# Patient Record
Sex: Male | Born: 1945
Health system: Southern US, Community
[De-identification: ages and names within clinical notes are randomized; demographics above are authoritative.]

## PROBLEM LIST (undated history)

## (undated) DIAGNOSIS — I7121 Aneurysm of the ascending aorta, without rupture: Secondary | ICD-10-CM

## (undated) DIAGNOSIS — J342 Deviated nasal septum: Secondary | ICD-10-CM

## (undated) DIAGNOSIS — M1990 Unspecified osteoarthritis, unspecified site: Secondary | ICD-10-CM

## (undated) DIAGNOSIS — R609 Edema, unspecified: Secondary | ICD-10-CM

## (undated) DIAGNOSIS — I2510 Atherosclerotic heart disease of native coronary artery without angina pectoris: Secondary | ICD-10-CM

## (undated) DIAGNOSIS — E118 Type 2 diabetes mellitus with unspecified complications: Secondary | ICD-10-CM

## (undated) DIAGNOSIS — Z9289 Personal history of other medical treatment: Secondary | ICD-10-CM

## (undated) DIAGNOSIS — N183 Chronic kidney disease, stage 3 (moderate): Secondary | ICD-10-CM

## (undated) DIAGNOSIS — E1165 Type 2 diabetes mellitus with hyperglycemia: Secondary | ICD-10-CM

## (undated) DIAGNOSIS — I712 Thoracic aortic aneurysm, without rupture: Secondary | ICD-10-CM

## (undated) DIAGNOSIS — I700 Atherosclerosis of aorta: Secondary | ICD-10-CM

## (undated) DIAGNOSIS — R9431 Abnormal electrocardiogram [ECG] [EKG]: Secondary | ICD-10-CM

## (undated) DIAGNOSIS — I10 Essential (primary) hypertension: Secondary | ICD-10-CM

## (undated) DIAGNOSIS — K5732 Diverticulitis of large intestine without perforation or abscess without bleeding: Secondary | ICD-10-CM

## (undated) DIAGNOSIS — J929 Pleural plaque without asbestos: Secondary | ICD-10-CM

## (undated) DIAGNOSIS — E785 Hyperlipidemia, unspecified: Secondary | ICD-10-CM

## (undated) DIAGNOSIS — R931 Abnormal findings on diagnostic imaging of heart and coronary circulation: Secondary | ICD-10-CM

## (undated) DIAGNOSIS — I5022 Chronic systolic (congestive) heart failure: Secondary | ICD-10-CM

## (undated) DIAGNOSIS — K219 Gastro-esophageal reflux disease without esophagitis: Secondary | ICD-10-CM

## (undated) DIAGNOSIS — I77810 Thoracic aortic ectasia: Secondary | ICD-10-CM

## (undated) DIAGNOSIS — I4892 Unspecified atrial flutter: Secondary | ICD-10-CM

## (undated) DIAGNOSIS — I5032 Chronic diastolic (congestive) heart failure: Secondary | ICD-10-CM

## (undated) HISTORY — DX: Pleural plaque without asbestos: J92.9

## (undated) HISTORY — DX: Chronic kidney disease, stage 3 (moderate): N18.3

## (undated) HISTORY — DX: Essential (primary) hypertension: I10

## (undated) HISTORY — DX: Edema, unspecified: R60.9

## (undated) HISTORY — DX: Abnormal electrocardiogram (ECG) (EKG): R94.31

## (undated) HISTORY — DX: Atherosclerosis of aorta: I70.0

## (undated) HISTORY — DX: Atherosclerotic heart disease of native coronary artery without angina pectoris: I25.10

## (undated) HISTORY — DX: Gastro-esophageal reflux disease without esophagitis: K21.9

## (undated) HISTORY — DX: Hyperlipidemia, unspecified: E78.5

## (undated) HISTORY — DX: Unspecified atrial flutter: I48.92

## (undated) HISTORY — DX: Chronic diastolic (congestive) heart failure: I50.32

## (undated) HISTORY — DX: Diverticulitis of large intestine without perforation or abscess without bleeding: K57.32

## (undated) HISTORY — DX: Personal history of other medical treatment: Z92.89

## (undated) HISTORY — DX: Thoracic aortic aneurysm, without rupture: I71.2

## (undated) HISTORY — DX: Abnormal findings on diagnostic imaging of heart and coronary circulation: R93.1

## (undated) HISTORY — DX: Deviated nasal septum: J34.2

## (undated) HISTORY — DX: Type 2 diabetes mellitus with hyperglycemia: E11.65

## (undated) HISTORY — DX: Aneurysm of the ascending aorta, without rupture: I71.21

## (undated) HISTORY — DX: Type 2 diabetes mellitus with unspecified complications: E11.8

## (undated) HISTORY — DX: Thoracic aortic ectasia: I77.810

---

## 2001-02-17 HISTORY — PX: KNEE ARTHROSCOPY: SHX127

## 2001-03-25 ENCOUNTER — Ambulatory Visit (HOSPITAL_COMMUNITY): Admission: RE | Admit: 2001-03-25 | Discharge: 2001-03-25 | Payer: Self-pay | Admitting: Gastroenterology

## 2002-01-28 ENCOUNTER — Ambulatory Visit (HOSPITAL_BASED_OUTPATIENT_CLINIC_OR_DEPARTMENT_OTHER): Admission: RE | Admit: 2002-01-28 | Discharge: 2002-01-28 | Payer: Self-pay | Admitting: Orthopedic Surgery

## 2004-02-18 HISTORY — PX: KNEE ARTHROSCOPY: SHX127

## 2011-10-19 DIAGNOSIS — I2510 Atherosclerotic heart disease of native coronary artery without angina pectoris: Secondary | ICD-10-CM

## 2011-10-19 HISTORY — DX: Atherosclerotic heart disease of native coronary artery without angina pectoris: I25.10

## 2011-10-22 ENCOUNTER — Other Ambulatory Visit: Payer: Self-pay | Admitting: Cardiology

## 2011-10-22 ENCOUNTER — Encounter: Payer: Self-pay | Admitting: Cardiology

## 2011-10-22 NOTE — H&P (Signed)
Progress Notes     Patient: James [name] Provider: Traci Turner, MD  DOB: 12/24/1945 Age: 66 Y Sex: Male Date: 10/22/2011  Phone: [phone]   Address: [address], [place], [place]  Pcp: James [name]       Subjective:     CC:    1. REFERRED BY DR [name] FOR CP AND SOB ON EXERTION. 2. No med list. verified verbally. .        HPI:  General:  The patient presents today for evaluation of chest pain and SOB. He says that he works out about 3 days weekly which he has been doing for a while. Recently he has noticed that about 1.5 minutes into the walk on the treadmill he has noticed chest pain midsternal with no radiation but is associated with SOB. He denies any diaphoresis or nausea. This has been going on for about 6 months. He is building a new house and the driveway is steep and if he tries to walk up it he will get chest pain. He initially thought it was asthma and was prescribed an inhaler to take before exercise which has not helped. He was recently started on a beta blocker which had seemed to help with him walking up the hill. He has only been on the beta blocker for 2 days. He occasionally has some LE edema. He denies any palpitations, dizziness or syncope..        ROS:  See HPI, A twelve system review was perfomed at today's visit. For pertinent positives and negatives see HPI.       Medical History: Severe nasal septal deviation, for planned surgery, Htn, Hypercholesterolemia.        Gyn History:        OB History:        Surgical History: Left knee arthroscopy 01/2002, Right knee arthroscopy 04/2004, colonoscopy 2008.        Hospitalization/Major Diagnostic Procedure: not in the past year 5/13.        Family History: Father: alive Mother: alive Paternal Grand Father: deceased obesity, high blood pressure Paternal Grand Mother: deceased Maternal Grand Father: deceased Maternal Grand Mother: deceased  neg GI family hx of colon cancer polyps or liver  disease.       Social History:  General:  History of smoking cigarettes: Never smoked.  no Smoking, never.  Alcohol: yes, Rare.  Caffeine: yes, 1 serving daily, coffee.  no Recreational drug use, none.  Occupation: unemployed, retired.  Education: yes, BS.  Marital Status: Divorced.  Children: 1, girls.        Medications: Aspirin 325 MG Tablet 1 tablet once a day, Multivitamins Tablet 1 tablet once a day, Omega-3 Fish Oil Capsule 2 capsules twice a day, Prilosec OTC 20 MG Tablet Delayed Release 1 tablet PRN, Crestor 40 MG Tablet 1 tablet once a day, Benicar HCT 40-25 MG Tablet 1 tablet Once a day, Pramipexole Dihydrochloride 0.75 MG Tablet 1 tablet twice a day, Metoprolol Succinate 50 MG Tablet Extended Release 24 Hour 1 tablet Once a day, Medication List reviewed and reconciled with the patient       Allergies: Lisinopril.       Objective:     Vitals: Wt 267.2, Wt change -1.3 lb, Ht 69, BMI 39.45, Pulse sitting 64, BP sitting 132/90.       Examination:  Cardiology, General:  GENERAL APPEARANCE: pleasant, NAD.  HEENT: unremarkable.  CAROTID UPSTROKE: normal, no bruit.  JVD: flat.  HEART SOUNDS: regular,   normal S1, S2, no S3 or S4.  MURMUR: absent.  LUNGS: no rales or wheezes.  ABDOMEN: soft, non tender, positive bowel sounds, no masses felt.  EXTREMITIES: bilateral trace pitting edema.  PERIPHERAL PULSES: 2 plus bilateral.        Assessment:     Assessment:  1. Chest pain - 786.50 (Primary)  2. Essential hypertension, benign - 401.1  3. SOBOE (shortness of breath on exertion) - 786.05    Plan:     1. Chest pain - He underwent nuclear stress test which showed anterior and inferoapical perfusion defects consistent with ischemia and transient ischemic dilatation worrisome for multi-vessel ASCAD.        2. Essential hypertension, benign Continue Benicar HCT Tablet, 40-25 MG, 1 tablet, Orally, Once a day ; Continue Metoprolol Succinate Tablet Extended Release 24  Hour, 50 MG, 1 tablet, Orally, Once a day .       Provider: Traci Turner, MD  Patient: James [name] DOB: 03/02/1945 Date: 10/22/2011    

## 2011-10-23 ENCOUNTER — Inpatient Hospital Stay (HOSPITAL_COMMUNITY)
Admission: RE | Admit: 2011-10-23 | Discharge: 2011-11-03 | DRG: 234 | Disposition: A | Discharge status: Home / Self Care | Payer: Medicare Other | Source: Ambulatory Visit | Attending: Cardiothoracic Surgery | Admitting: Cardiothoracic Surgery

## 2011-10-23 ENCOUNTER — Encounter (HOSPITAL_COMMUNITY)
Admission: RE | Disposition: A | Discharge status: Home / Self Care | Payer: Self-pay | Source: Ambulatory Visit | Attending: Cardiothoracic Surgery

## 2011-10-23 DIAGNOSIS — I200 Unstable angina: Secondary | ICD-10-CM | POA: Diagnosis present

## 2011-10-23 DIAGNOSIS — E1165 Type 2 diabetes mellitus with hyperglycemia: Secondary | ICD-10-CM

## 2011-10-23 DIAGNOSIS — K560 Paralytic ileus: Secondary | ICD-10-CM | POA: Diagnosis not present

## 2011-10-23 DIAGNOSIS — E669 Obesity, unspecified: Secondary | ICD-10-CM | POA: Diagnosis present

## 2011-10-23 DIAGNOSIS — I2510 Atherosclerotic heart disease of native coronary artery without angina pectoris: Principal | ICD-10-CM | POA: Diagnosis present

## 2011-10-23 DIAGNOSIS — I10 Essential (primary) hypertension: Secondary | ICD-10-CM | POA: Diagnosis present

## 2011-10-23 DIAGNOSIS — I4891 Unspecified atrial fibrillation: Secondary | ICD-10-CM | POA: Diagnosis not present

## 2011-10-23 DIAGNOSIS — J9819 Other pulmonary collapse: Secondary | ICD-10-CM | POA: Diagnosis not present

## 2011-10-23 DIAGNOSIS — E785 Hyperlipidemia, unspecified: Secondary | ICD-10-CM | POA: Diagnosis present

## 2011-10-23 DIAGNOSIS — N289 Disorder of kidney and ureter, unspecified: Secondary | ICD-10-CM | POA: Diagnosis not present

## 2011-10-23 DIAGNOSIS — K219 Gastro-esophageal reflux disease without esophagitis: Secondary | ICD-10-CM | POA: Diagnosis present

## 2011-10-23 DIAGNOSIS — Z951 Presence of aortocoronary bypass graft: Secondary | ICD-10-CM

## 2011-10-23 DIAGNOSIS — D62 Acute posthemorrhagic anemia: Secondary | ICD-10-CM | POA: Diagnosis not present

## 2011-10-23 DIAGNOSIS — IMO0001 Reserved for inherently not codable concepts without codable children: Secondary | ICD-10-CM | POA: Diagnosis present

## 2011-10-23 DIAGNOSIS — Z7982 Long term (current) use of aspirin: Secondary | ICD-10-CM

## 2011-10-23 DIAGNOSIS — E118 Type 2 diabetes mellitus with unspecified complications: Secondary | ICD-10-CM | POA: Diagnosis present

## 2011-10-23 DIAGNOSIS — I4892 Unspecified atrial flutter: Secondary | ICD-10-CM | POA: Diagnosis not present

## 2011-10-23 DIAGNOSIS — J90 Pleural effusion, not elsewhere classified: Secondary | ICD-10-CM | POA: Diagnosis not present

## 2011-10-23 DIAGNOSIS — D696 Thrombocytopenia, unspecified: Secondary | ICD-10-CM | POA: Diagnosis not present

## 2011-10-23 DIAGNOSIS — E8779 Other fluid overload: Secondary | ICD-10-CM | POA: Diagnosis not present

## 2011-10-23 DIAGNOSIS — Z79899 Other long term (current) drug therapy: Secondary | ICD-10-CM

## 2011-10-23 HISTORY — PX: LEFT HEART CATHETERIZATION WITH CORONARY ANGIOGRAM: SHX5451

## 2011-10-23 LAB — COMPREHENSIVE METABOLIC PANEL
ALT: 22 U/L (ref 0–53)
AST: 19 U/L (ref 0–37)
CO2: 27 mEq/L (ref 19–32)
Chloride: 96 mEq/L (ref 96–112)
GFR calc non Af Amer: 71 mL/min — ABNORMAL LOW (ref >90)
Sodium: 136 mEq/L (ref 135–145)
Total Bilirubin: 0.9 mg/dL (ref 0.3–1.2)

## 2011-10-23 SURGERY — LEFT HEART CATHETERIZATION WITH CORONARY ANGIOGRAM
Anesthesia: LOCAL

## 2011-10-23 MED ORDER — ASPIRIN EC 325 MG PO TBEC: 325.0000 mg | DELAYED_RELEASE_TABLET | Freq: Every day | ORAL | Status: DC

## 2011-10-23 MED ORDER — ONDANSETRON HCL 4 MG/2ML IJ SOLN: 4.0000 mg | Freq: Four times a day (QID) | INTRAMUSCULAR | Status: DC | PRN

## 2011-10-23 MED ORDER — ACETAMINOPHEN 325 MG PO TABS: 650.0000 mg | ORAL_TABLET | ORAL | Status: DC | PRN

## 2011-10-23 MED ORDER — HYDROCHLOROTHIAZIDE 25 MG PO TABS
25.0000 mg | ORAL_TABLET | Freq: Every day | ORAL | Status: DC
  Administered 2011-10-24 – 2011-10-26 (×3): 25 mg via ORAL
  Filled 2011-10-23 (×4): qty 1

## 2011-10-23 MED ORDER — SODIUM CHLORIDE 0.9 % IV SOLN
INTRAVENOUS | Status: DC
  Administered 2011-10-23 – 2011-10-24 (×2): via INTRAVENOUS

## 2011-10-23 MED ORDER — HEPARIN (PORCINE) IN NACL 100-0.45 UNIT/ML-% IJ SOLN
1800.0000 [IU]/h | INTRAMUSCULAR | Status: DC
  Administered 2011-10-23: 1200 [IU]/h via INTRAVENOUS
  Administered 2011-10-24: 1800 [IU]/h via INTRAVENOUS
  Filled 2011-10-23 (×3): qty 250

## 2011-10-23 MED ORDER — ASPIRIN 81 MG PO CHEW
324.0000 mg | CHEWABLE_TABLET | ORAL | Status: AC
  Administered 2011-10-23: 324 mg via ORAL

## 2011-10-23 MED ORDER — LIDOCAINE HCL (PF) 1 % IJ SOLN
INTRAMUSCULAR | Status: AC
  Filled 2011-10-23: qty 30

## 2011-10-23 MED ORDER — HEPARIN (PORCINE) IN NACL 2-0.9 UNIT/ML-% IJ SOLN
INTRAMUSCULAR | Status: AC
  Filled 2011-10-23: qty 1000

## 2011-10-23 MED ORDER — IRBESARTAN 300 MG PO TABS
300.0000 mg | ORAL_TABLET | Freq: Every day | ORAL | Status: DC
  Administered 2011-10-24 – 2011-10-26 (×3): 300 mg via ORAL
  Filled 2011-10-23 (×4): qty 1

## 2011-10-23 MED ORDER — FENTANYL CITRATE 0.05 MG/ML IJ SOLN
INTRAMUSCULAR | Status: AC
  Filled 2011-10-23: qty 2

## 2011-10-23 MED ORDER — MIDAZOLAM HCL 2 MG/2ML IJ SOLN
INTRAMUSCULAR | Status: AC
  Filled 2011-10-23: qty 2

## 2011-10-23 MED ORDER — MORPHINE SULFATE 2 MG/ML IJ SOLN: 1.0000 mg | INTRAMUSCULAR | Status: DC | PRN

## 2011-10-23 MED ORDER — ATORVASTATIN CALCIUM 80 MG PO TABS
80.0000 mg | ORAL_TABLET | Freq: Every day | ORAL | Status: DC
  Administered 2011-10-24 – 2011-11-03 (×10): 80 mg via ORAL
  Filled 2011-10-23 (×11): qty 1

## 2011-10-23 MED ORDER — ASPIRIN 81 MG PO CHEW: 81.0000 mg | CHEWABLE_TABLET | Freq: Every day | ORAL | Status: DC

## 2011-10-23 MED ORDER — SODIUM CHLORIDE 0.9 % IV SOLN: 1.0000 mL/kg/h | INTRAVENOUS | Status: AC

## 2011-10-23 MED ORDER — NITROGLYCERIN 0.2 MG/ML ON CALL CATH LAB
INTRAVENOUS | Status: AC
  Filled 2011-10-23: qty 1

## 2011-10-23 MED ORDER — OLMESARTAN MEDOXOMIL-HCTZ 40-25 MG PO TABS: 1.0000 | ORAL_TABLET | Freq: Every day | ORAL | Status: DC

## 2011-10-23 MED ORDER — NITROGLYCERIN IN D5W 200-5 MCG/ML-% IV SOLN
3.0000 ug/min | INTRAVENOUS | Status: DC
  Administered 2011-10-24: 3 ug/min via INTRAVENOUS
  Filled 2011-10-23: qty 250

## 2011-10-23 MED ORDER — PANTOPRAZOLE SODIUM 40 MG PO TBEC
40.0000 mg | DELAYED_RELEASE_TABLET | Freq: Every day | ORAL | Status: DC
  Administered 2011-10-24 – 2011-10-26 (×3): 40 mg via ORAL
  Filled 2011-10-23 (×3): qty 1

## 2011-10-23 MED ORDER — DIAZEPAM 5 MG PO TABS
5.0000 mg | ORAL_TABLET | ORAL | Status: AC
  Administered 2011-10-23: 5 mg via ORAL

## 2011-10-23 MED ORDER — SODIUM CHLORIDE 0.9 % IJ SOLN
3.0000 mL | Freq: Two times a day (BID) | INTRAMUSCULAR | Status: DC
  Administered 2011-10-23 – 2011-10-26 (×7): 3 mL via INTRAVENOUS

## 2011-10-23 MED ORDER — PRAMIPEXOLE DIHYDROCHLORIDE 0.25 MG PO TABS
0.7500 mg | ORAL_TABLET | ORAL | Status: AC
  Administered 2011-10-23: 0.75 mg via ORAL
  Filled 2011-10-23: qty 3

## 2011-10-23 MED ORDER — ASPIRIN 81 MG PO CHEW
CHEWABLE_TABLET | ORAL | Status: AC
  Administered 2011-10-23: 324 mg via ORAL
  Filled 2011-10-23: qty 4

## 2011-10-23 MED ORDER — SODIUM CHLORIDE 0.9 % IV SOLN: 250.0000 mL | INTRAVENOUS | Status: DC | PRN

## 2011-10-23 MED ORDER — METOPROLOL SUCCINATE ER 50 MG PO TB24
50.0000 mg | ORAL_TABLET | Freq: Every day | ORAL | Status: DC
  Administered 2011-10-23 – 2011-10-26 (×2): 50 mg via ORAL
  Filled 2011-10-23 (×5): qty 1

## 2011-10-23 MED ORDER — SODIUM CHLORIDE 0.9 % IJ SOLN: 3.0000 mL | INTRAMUSCULAR | Status: DC | PRN

## 2011-10-23 MED ORDER — NITROGLYCERIN 0.4 MG SL SUBL: 0.4000 mg | SUBLINGUAL_TABLET | SUBLINGUAL | Status: DC | PRN

## 2011-10-23 MED ORDER — ASPIRIN EC 325 MG PO TBEC
325.0000 mg | DELAYED_RELEASE_TABLET | Freq: Every day | ORAL | Status: DC
  Administered 2011-10-23 – 2011-10-26 (×4): 325 mg via ORAL
  Filled 2011-10-23 (×5): qty 1

## 2011-10-23 MED ORDER — DIAZEPAM 5 MG PO TABS
ORAL_TABLET | ORAL | Status: AC
  Administered 2011-10-23: 5 mg via ORAL
  Filled 2011-10-23: qty 1

## 2011-10-23 MED ORDER — PRAMIPEXOLE DIHYDROCHLORIDE 0.25 MG PO TABS
0.7500 mg | ORAL_TABLET | Freq: Two times a day (BID) | ORAL | Status: DC
  Administered 2011-10-23 – 2011-10-26 (×7): 0.75 mg via ORAL
  Filled 2011-10-23 (×9): qty 3

## 2011-10-23 NOTE — Interval H&P Note (Signed)
History and Physical Interval Note:  10/23/2011 3:38 PM  James [name]  has presented today for surgery, with the diagnosis of abnormal stress test  The various methods of treatment have been discussed with the patient and family. After consideration of risks, benefits and other options for treatment, the patient has consented to  Procedure(s) (LRB) with comments: LEFT HEART CATHETERIZATION WITH CORONARY ANGIOGRAM (N/A) as a surgical intervention .  The patient's history has been reviewed, patient examined, no change in status, stable for surgery.  I have reviewed the patient's chart and labs.  Questions were answered to the patient's satisfaction.     TURNER,TRACI R

## 2011-10-23 NOTE — Progress Notes (Signed)
ANTICOAGULATION CONSULT NOTE - Initial Consult  Pharmacy Consult for Heparin Indication: Anticoagulation while awaiting CVTS plans  Allergies  Allergen Reactions  . Lisinopril     Patient Measurements: Height: 5\' 9"  (175.3 cm) Weight: 258 lb 2.5 oz (117.1 kg) IBW/kg (Calculated) : 70.7  Heparin Dosing Weight: 97 kg  Vital Signs: Temp: 98.8 F (37.1 C) (09/05 1938) Temp src: Oral (09/05 1938) BP: 127/62 mmHg (09/05 2000) Pulse Rate: 98  (09/05 1557)  Labs: No results found for this basename: HGB:2,HCT:3,PLT:3,APTT:3,LABPROT:3,INR:3,HEPARINUNFRC:3,CREATININE:3,CKTOTAL:3,CKMB:3,TROPONINI:3 in the last 72 hours  CrCl is unknown because no creatinine reading has been taken.   Medical History: Past Medical History  Diagnosis Date  . Hypertension   . Dyslipidemia   . Deviated nasal septum     Assessment: 66 y.o. M admitted to [facility] [place] on 9/5 for planned cath procedure in which the patient was found to have severe CAD and is now awaiting a CVTS consult. Pharmacy was consulted to start heparin 4 hours after sheath removed with no bolus. Sheath was removed at ~1630 this afternoon. Heparin dosing wt~ 97 kg. No baseline BMET at this time.   Goal of Therapy:  Heparin level 0.3-0.7 units/ml Monitor platelets by anticoagulation protocol: Yes   Plan:  1. Initiate heparin drip at rate of 1200 units/hr (12 ml/hr) starting at 2030 2. Daily heparin levels, CBC 3. Will continue to monitor for any signs/symptoms of bleeding and will follow up with heparin level in 8 hours with a.m. labs   Georgina Pillion, PharmD, BCPS Clinical Pharmacist Pager: [phone] 10/23/2011 8:10 PM

## 2011-10-23 NOTE — H&P (View-Only) (Signed)
Progress Notes     Patient: James [name], James [name] Provider: Armanda Magic, MD  DOB: 06-Jun-1945 Age: 66 Y Sex: Male Date: 10/22/2011  Phone: [phone]   Address: [address], [place], [place]  Pcp: ROBERT FRIED       Subjective:     CC:    1. REFERRED BY DR FRIED FOR CP AND SOB ON EXERTION. 2. No med list. verified verbally. Marland Kitchen        HPI:  General:  The patient presents today for evaluation of chest pain and SOB. He says that he works out about 3 days weekly which he has been doing for a while. Recently he has noticed that about 1.5 minutes into the walk on the treadmill he has noticed chest pain midsternal with no radiation but is associated with SOB. He denies any diaphoresis or nausea. This has been going on for about 6 months. He is building a new house and the driveway is steep and if he tries to walk up it he will get chest pain. He initially thought it was asthma and was prescribed an inhaler to take before exercise which has not helped. He was recently started on a beta blocker which had seemed to help with him walking up the hill. He has only been on the beta blocker for 2 days. He occasionally has some LE edema. He denies any palpitations, dizziness or syncope..        ROS:  See HPI, A twelve system review was perfomed at today's visit. For pertinent positives and negatives see HPI.       Medical History: Severe nasal septal deviation, for planned surgery, Htn, Hypercholesterolemia.        Gyn History:        OB History:        Surgical History: Left knee arthroscopy 01/2002, Right knee arthroscopy 04/2004, colonoscopy 2008.        Hospitalization/Major Diagnostic Procedure: not in the past year 5/13.        Family History: Father: alive Mother: alive Paternal Grand Father: deceased obesity, high blood pressure Paternal Grand Mother: deceased Maternal Grand Father: deceased Maternal Grand Mother: deceased  neg GI family hx of colon cancer polyps or liver  disease.       Social History:  General:  History of smoking cigarettes: Never smoked.  no Smoking, never.  Alcohol: yes, Rare.  Caffeine: yes, 1 serving daily, coffee.  no Recreational drug use, none.  Occupation: unemployed, retired.  Education: yes, BS.  Marital Status: Divorced.  Children: 1, girls.        Medications: Aspirin 325 MG Tablet 1 tablet once a day, Multivitamins Tablet 1 tablet once a day, Omega-3 Fish Oil Capsule 2 capsules twice a day, Prilosec OTC 20 MG Tablet Delayed Release 1 tablet PRN, Crestor 40 MG Tablet 1 tablet once a day, Benicar HCT 40-25 MG Tablet 1 tablet Once a day, Pramipexole Dihydrochloride 0.75 MG Tablet 1 tablet twice a day, Metoprolol Succinate 50 MG Tablet Extended Release 24 Hour 1 tablet Once a day, Medication List reviewed and reconciled with the patient       Allergies: Lisinopril.       Objective:     Vitals: Wt 267.2, Wt change -1.3 lb, Ht 69, BMI 39.45, Pulse sitting 64, BP sitting 132/90.       Examination:  Cardiology, General:  GENERAL APPEARANCE: pleasant, NAD.  HEENT: unremarkable.  CAROTID UPSTROKE: normal, no bruit.  JVD: flat.  HEART SOUNDS: regular,  normal S1, S2, no S3 or S4.  MURMUR: absent.  LUNGS: no rales or wheezes.  ABDOMEN: soft, non tender, positive bowel sounds, no masses felt.  EXTREMITIES: bilateral trace pitting edema.  PERIPHERAL PULSES: 2 plus bilateral.        Assessment:     Assessment:  1. Chest pain - 786.50 (Primary)  2. Essential hypertension, benign - 401.1  3. SOBOE (shortness of breath on exertion) - 786.05    Plan:     1. Chest pain - He underwent nuclear stress test which showed anterior and inferoapical perfusion defects consistent with ischemia and transient ischemic dilatation worrisome for multi-vessel ASCAD.        2. Essential hypertension, benign Continue Benicar HCT Tablet, 40-25 MG, 1 tablet, Orally, Once a day ; Continue Metoprolol Succinate Tablet Extended Release 24  Hour, 50 MG, 1 tablet, Orally, Once a day .       Provider: Armanda Magic, MD  Patient: James [name], James [name] DOB: Dec 16, 1945 Date: 10/22/2011

## 2011-10-23 NOTE — CV Procedure (Signed)
PROCEDURE:  Left heart catheterization with selective coronary angiography, left ventriculogram.  INDICATIONS:    The risks, benefits, and details of the procedure were explained to the patient.  The patient verbalized understanding and wanted to proceed.  Informed written consent was obtained.  PROCEDURE TECHNIQUE:  After Xylocaine anesthesia a 26F sheath was placed in the right femoral artery with a single anterior needle wall stick.   Left coronary angiography was done using a Judkins L4 guide catheter.  Right coronary angiography was done using a Judkins R4 guide catheter.  Left ventriculography was done using a pigtail catheter.    CONTRAST:  Total of 130 cc.  COMPLICATIONS:  None.    HEMODYNAMICS:  Aortic pressure was 136/29mmHg; LV pressure was 139/58mmHg; LVEDP .  There was no gradient between the left ventricle and aorta.    ANGIOGRAPHIC DATA:   The left main coronary artery is short and appears patent until the distal portion where there appears to be disease with pressure damping upon catheter engaging the vessel.  The left anterior descending artery has a 99% stenosis at the takeoff of the first diagonal.  The ongoing LAD is patent.  The first diagonal is patent.  The LAD give rise to a second diagonal which is large with an 80% ostial stenosis.  The diagonal then trifurcates into 3 daughter vessels.    The left circumflex artery is widely patent and gives rise to a small first OM which is patent.  The left circumflex then gives rise to a large OM2 which is widely patent and trifurcates into 3 daughter vessels all of which are patent.    The right coronary artery has an ostial 90% stenosis and then gives rise to 2 moderate sized acute RV marginal branches which are patent.  The ongoing RCA has a long 80% stenosis in the distal vessel and then bifurcates into a PDA and PL branches which are patent.  LEFT VENTRICULOGRAM:  Left ventricular angiogram was done in the 30 RAO  projection and revealed normal left ventricular wall motion and systolic function with an estimated ejection fraction of 50%.  LVEDP was 14 mmHg.  IMPRESSIONS:  1. ASCAD of the left main with no obvious obstructive lesion but the stenosis in the proximal LAD appears to extend into the distal LM and there was pressure damping of the LM with catheter engagement. 2. 99% proximal LAD stenosis and 80% ostial second diagonal stenosis 3. Normal left circumflex artery and its branches. 4.  90% ostial RCA stenosis and 80% distal RCA stenosis 5.  Low normal Left Ventricular systolic function.  LVEDP 14 mmHg.  Ejection fraction 50%.  RECOMMENDATION:   1.  Films were reviewed with Dr. Eldridge Dace.  The patient has severe stenosis of the proximal LAD that appears to extend back in to the distal left main and there was pressure damping when engaging the catheter.  He also has severe stenosis in several places in the RCA which would require multiple stents.  The second diagonal has severe ostial disease that Dr. Eldridge Dace felt was not amenable to PCI.  Therefore I feel patient's best long term benefit would be from CABG.   2.  Admit to step-down tele 3.  Consult CVTS 4.  IV Heparin to start with no bolus 4 hours after sheath pulled 5.  Continue ASA/beta blocker 6.  IV NTG gtt 7.  Check fasting statin panel

## 2011-10-24 ENCOUNTER — Other Ambulatory Visit: Payer: Self-pay | Admitting: *Deleted

## 2011-10-24 ENCOUNTER — Encounter (HOSPITAL_COMMUNITY): Payer: Self-pay | Admitting: *Deleted

## 2011-10-24 ENCOUNTER — Inpatient Hospital Stay (HOSPITAL_COMMUNITY): Payer: Medicare Other

## 2011-10-24 DIAGNOSIS — E118 Type 2 diabetes mellitus with unspecified complications: Secondary | ICD-10-CM | POA: Diagnosis present

## 2011-10-24 DIAGNOSIS — IMO0002 Reserved for concepts with insufficient information to code with codable children: Secondary | ICD-10-CM

## 2011-10-24 DIAGNOSIS — K219 Gastro-esophageal reflux disease without esophagitis: Secondary | ICD-10-CM | POA: Diagnosis present

## 2011-10-24 DIAGNOSIS — E1165 Type 2 diabetes mellitus with hyperglycemia: Secondary | ICD-10-CM

## 2011-10-24 DIAGNOSIS — I10 Essential (primary) hypertension: Secondary | ICD-10-CM | POA: Diagnosis present

## 2011-10-24 DIAGNOSIS — E785 Hyperlipidemia, unspecified: Secondary | ICD-10-CM

## 2011-10-24 DIAGNOSIS — I2510 Atherosclerotic heart disease of native coronary artery without angina pectoris: Secondary | ICD-10-CM

## 2011-10-24 HISTORY — DX: Type 2 diabetes mellitus with hyperglycemia: E11.65

## 2011-10-24 HISTORY — DX: Reserved for concepts with insufficient information to code with codable children: IMO0002

## 2011-10-24 LAB — URINALYSIS, ROUTINE W REFLEX MICROSCOPIC
Bilirubin Urine: NEGATIVE
Glucose, UA: NEGATIVE mg/dL
Hgb urine dipstick: NEGATIVE
Ketones, ur: NEGATIVE mg/dL
Leukocytes, UA: NEGATIVE
Nitrite: NEGATIVE
Protein, ur: NEGATIVE mg/dL
Specific Gravity, Urine: 1.01 (ref 1.005–1.030)
Urobilinogen, UA: 1 mg/dL (ref 0.0–1.0)
pH: 7 (ref 5.0–8.0)

## 2011-10-24 LAB — LIPID PANEL
Cholesterol: 139 mg/dL (ref 0–200)
HDL: 47 mg/dL (ref >39)
LDL Cholesterol: 72 mg/dL (ref 0–99)
Total CHOL/HDL Ratio: 3.1 RATIO
Total CHOL/HDL Ratio: 3 RATIO
Triglycerides: 102 mg/dL (ref <150)
VLDL: 22 mg/dL (ref 0–40)
VLDL: 20 mg/dL (ref 0–40)

## 2011-10-24 LAB — BASIC METABOLIC PANEL
CO2: 28 mEq/L (ref 19–32)
Calcium: 9.1 mg/dL (ref 8.4–10.5)
Creatinine, Ser: 1.13 mg/dL (ref 0.50–1.35)
GFR calc non Af Amer: 66 mL/min — ABNORMAL LOW (ref >90)
Glucose, Bld: 161 mg/dL — ABNORMAL HIGH (ref 70–99)
Sodium: 135 mEq/L (ref 135–145)

## 2011-10-24 LAB — SURGICAL PCR SCREEN
MRSA, PCR: NEGATIVE
Staphylococcus aureus: NEGATIVE

## 2011-10-24 LAB — CBC
Platelets: 218 10*3/uL (ref 150–400)
RBC: 5.32 MIL/uL (ref 4.22–5.81)
RDW: 13 % (ref 11.5–15.5)
WBC: 9.7 10*3/uL (ref 4.0–10.5)

## 2011-10-24 LAB — HEPARIN LEVEL (UNFRACTIONATED)
Heparin Unfractionated: <0.1 IU/mL — ABNORMAL LOW (ref 0.30–0.70)
Heparin Unfractionated: 0.25 IU/mL — ABNORMAL LOW (ref 0.30–0.70)

## 2011-10-24 LAB — TSH: TSH: 1.557 u[IU]/mL (ref 0.350–4.500)

## 2011-10-24 LAB — GLUCOSE, CAPILLARY: Glucose-Capillary: 120 mg/dL — ABNORMAL HIGH (ref 70–99)

## 2011-10-24 MED ORDER — INSULIN ASPART 100 UNIT/ML ~~LOC~~ SOLN
0.0000 [IU] | Freq: Three times a day (TID) | SUBCUTANEOUS | Status: DC
  Administered 2011-10-25: 2 [IU] via SUBCUTANEOUS

## 2011-10-24 MED ORDER — PNEUMOCOCCAL VAC POLYVALENT 25 MCG/0.5ML IJ INJ: 0.5000 mL | INJECTION | Freq: Once | INTRAMUSCULAR | Status: DC

## 2011-10-24 MED ORDER — GLIPIZIDE 2.5 MG HALF TABLET
2.5000 mg | ORAL_TABLET | Freq: Two times a day (BID) | ORAL | Status: DC
  Administered 2011-10-24 – 2011-10-26 (×4): 2.5 mg via ORAL
  Filled 2011-10-24 (×8): qty 1

## 2011-10-24 MED ORDER — PNEUMOCOCCAL VAC POLYVALENT 25 MCG/0.5ML IJ INJ: 0.5000 mL | INJECTION | INTRAMUSCULAR | Status: DC

## 2011-10-24 MED ORDER — LORAZEPAM 0.5 MG PO TABS: 0.5000 mg | ORAL_TABLET | ORAL | Status: DC | PRN

## 2011-10-24 MED ORDER — HEPARIN BOLUS VIA INFUSION
1500.0000 [IU] | Freq: Once | INTRAVENOUS | Status: AC
  Administered 2011-10-24: 1500 [IU] via INTRAVENOUS
  Filled 2011-10-24: qty 1500

## 2011-10-24 MED ORDER — HEPARIN (PORCINE) IN NACL 100-0.45 UNIT/ML-% IJ SOLN
1900.0000 [IU]/h | INTRAMUSCULAR | Status: DC
  Administered 2011-10-25: 1900 [IU]/h via INTRAVENOUS
  Administered 2011-10-25: 2000 [IU]/h via INTRAVENOUS
  Filled 2011-10-24 (×6): qty 250

## 2011-10-24 MED ORDER — LIVING WELL WITH DIABETES BOOK
Freq: Once | Status: AC
  Administered 2011-10-24: 1
  Filled 2011-10-24 (×2): qty 1

## 2011-10-24 MED ORDER — ALBUTEROL SULFATE (5 MG/ML) 0.5% IN NEBU
2.5000 mg | INHALATION_SOLUTION | Freq: Once | RESPIRATORY_TRACT | Status: AC
  Administered 2011-10-24: 2.5 mg via RESPIRATORY_TRACT

## 2011-10-24 NOTE — Progress Notes (Signed)
  Echocardiogram 2D Echocardiogram has been performed.  Cathie Beams 10/24/2011, 9:54 AM

## 2011-10-24 NOTE — Progress Notes (Signed)
ANTICOAGULATION CONSULT NOTE   Pharmacy Consult for Heparin Indication: CAD s/p cath  Patient Measurements: Height: 5\' 9"  (175.3 cm) Weight: 258 lb 2.5 oz (117.1 kg) IBW/kg (Calculated) : 70.7  Heparin Dosing Weight: 97 kg  Vital Signs: Temp: 97.6 F (36.4 C) (09/06 1200) Temp src: Oral (09/06 1200) BP: 101/71 mmHg (09/06 1200) Pulse Rate: 53  (09/06 1200)  Labs:  Basename 10/24/11 1427 10/24/11 0405 10/23/11 2034  HGB 16.1 -- --  HCT 45.6 -- --  PLT 218 -- --  APTT -- -- --  LABPROT -- -- --  INR -- -- --  HEPARINUNFRC 0.25* <0.10* --  CREATININE -- 1.13 1.06  CKTOTAL -- -- --  CKMB -- -- --  TROPONINI -- -- --    Estimated Creatinine Clearance: 81.2 ml/min (by C-G formula based on Cr of 1.13).  Assessment: 66 y.o. Male with CAD s/p cath, awaiting TCTS consult, for Heparin. Follow up heparin level is still below goal. No signs of bleeding noted. Will adjust rate. Intial CB C within normal limit.  Goal of Therapy:  Heparin level 0.3-0.7 units/ml Monitor platelets by anticoagulation protocol: Yes   Plan:  Bolus heparin 1500 units Increase Heparin 2000 units/hr Check heparin level in 8 hours.  Sheppard Coil, PharmD, BCPS 10/24/2011 3:41 PM

## 2011-10-24 NOTE — Progress Notes (Signed)
SUBJECTIVE:  Doing well this am.  Denies chest pain, SOB  OBJECTIVE:   Vitals:   Filed Vitals:   10/24/11 0500 10/24/11 0600 10/24/11 0700 10/24/11 0800  BP: 105/73 102/72 118/75 110/63  Pulse: 52 52 54   Temp:   98.4 F (36.9 C)   TempSrc:   Oral   Resp:   18 19  Height:      Weight:      SpO2: 95% 95% 94%    I&O's:   Intake/Output Summary (Last 24 hours) at 10/24/11 0931 Last data filed at 10/24/11 0900  Gross per 24 hour  Intake 1970.4 ml  Output   1575 ml  Net  395.4 ml   TELEMETRY: Reviewed telemetry pt in NSR     PHYSICAL EXAM General: Well developed, well nourished, in no acute distress Head: Eyes PERRLA, No xanthomas.   Normal cephalic and atramatic  Lungs:    Clear bilaterally to auscultation and percussion. Heart:    HRRR S1 S2 Pulses are 2+ & equal. Abdomen: Bowel sounds are positive, abdomen soft and non-tender without masses Extremities:    No clubbing, cyanosis or edema.  DP +1 right groin with no hematoma Neuro: Alert and oriented X 3. Psych:  Good affect, responds appropriately   LABS: Basic Metabolic Panel:  Basename 10/24/11 0405 10/23/11 2034  NA 135 136  K 3.5 3.5  CL 97 96  CO2 28 27  GLUCOSE 161* 113*  BUN 18 17  CREATININE 1.13 1.06  CALCIUM 9.1 9.8  MG -- 2.0  PHOS -- --   Liver Function Tests:  [facility] 10/23/11 2034  AST 19  ALT 22  ALKPHOS 78  BILITOT 0.9  PROT 7.7  ALBUMIN 4.2   Hemoglobin A1C:  Basename 10/23/11 2034  HGBA1C 7.8*   Fasting Lipid Panel:  Basename 10/24/11 0405  CHOL 127  HDL 41  LDLCALC 64  TRIG 111  CHOLHDL 3.1  LDLDIRECT --   Thyroid Function Tests:  Basename 10/23/11 2034  TSH 1.557  T4TOTAL --  T3FREE --  THYROIDAB --    RADIOLOGY: No results found.   ASSESSMENT:  1.  Severe 2 vessel ASCAD with normal LVF 2.  New onset DM by elevated HbA1C 3.  Obesity 4.  dyslipicemia  PLAN:   1.  Await CVTS consult 2.  Hospitalist consult for DM management 3.  Continue IV  Heparinbn/NTG gtt, ASA, statin, beta blocker  Quintella Reichert, MD  10/24/2011  9:31 AM

## 2011-10-24 NOTE — Plan of Care (Signed)
Problem: Food- and Nutrition-Related Knowledge Deficit (NB-1.1) Goal: Nutrition education Formal process to instruct or train a patient/client in a skill or to impart knowledge to help patients/clients voluntarily manage or modify food choices and eating behavior to maintain or improve health.  Outcome: Completed/Met Date Met:  10/24/11  RD consulted for nutrition education regarding diabetes.     Lab Results  Component Value Date    HGBA1C 7.8* 10/23/2011    RD provided "Carbohydrate Counting for People with Diabetes" handout from the Academy of Nutrition and Dietetics. Discussed different food groups and their effects on blood sugar, emphasizing carbohydrate-containing foods. Provided list of carbohydrates and recommended serving sizes of common foods.  Discussed importance of controlled and consistent carbohydrate intake throughout the day. Provided examples of ways to balance meals/snacks and encouraged intake of high-fiber, whole grain complex carbohydrates.  Pt states that he is going to follow a program of eating 2 shakes and 1 meal daily. Encouraged pt to read labels on shakes, mix with low fat milk and avoid added sugars. Pt states that he will diet when his weight gets to high. Has an all or nothing type pattern of diet or eating "junk food". Encouraged pt to follow a moderate eating style that would be more sustainable long term. Pt agreeable to following recommendations.    Expect good compliance.  Body mass index is 38.12 kg/(m^2). Pt meets criteria for obesity class 2 based on current BMI.  Current diet order is Carb Mod medium, no meals documented at this time. Labs and medications reviewed. No further nutrition interventions warranted at this time. RD contact information provided. If additional nutrition issues arise, please re-consult RD.  Clarene Duke RD, LDN Pager [phone] After Hours pager [phone]

## 2011-10-24 NOTE — Progress Notes (Signed)
Dr Adolm Joseph was notified about pt's low HR in low 50s, last BP 105/73, Nitroglycerin drip was stopped at 4 o'clock, pt denies any pain. No orders received.

## 2011-10-24 NOTE — Progress Notes (Signed)
Inpatient Diabetes Program Recommendations  AACE/ADA: New Consensus Statement on Inpatient Glycemic Control (2013)  Target Ranges:  Prepandial:   less than 140 mg/dL      Peak postprandial:   less than 180 mg/dL (1-2 hours)      Critically ill patients:  140 - 180 mg/dL   Diabetes Coordinator RN spoke with patient concerning A1C 7.8 and new diagnosis of DM.  Patient said that in the past he has been told he was "borderline".  He has never taken meds or monitored his CBGs at home.  He has never had any OP education.  Patient said that he would be willing to go for OP education at the [facility].  This coordinator will place the order for OP DM education.  Encouraged lifestyle modification through diet and exercise.   He will need a RX for a glucose meter and strips.    Thank you  Piedad Climes RN,BSN,CDE Inpatient Diabetes Coordinator [phone] (team pager)

## 2011-10-24 NOTE — Consult Note (Addendum)
Patient's PCP: Lolita Patella, MD Patient's Cardiologist: Dr. Mayford Knife  Referring physician: Dr. Mayford Knife  Reason for consultation: Help manage new onset diabetes.  History of Present Illness: James [name] is a 66 y.o. Caucasian male he was admitted by cardiology yesterday for coronary artery disease.  Patient over the last 6 months has been having shortness of breath with exertion.  Eventually the patient was referred to cardiology, he had a cardiac catheter done on 10/23/2011 which showed coronary artery disease with severe stenosis of proximal LAD extending into the distal left main, severe stenosis in the RCA, second diagonal had severe ostial disease, patient was admitted for consideration of CABG.  Hemoglobin A1c was checked and was found to be 7.8 indicating an average blood sugar of 177 over the last 3 months, the hospitalist service was consult for further management.  Patient denies any recent fevers, chills, nausea, vomiting, currently denies any chest pain or shortness of breath, abdominal pain, diarrhea, headaches, or vision changes.  Past Medical History  Diagnosis Date  . Hypertension   . Dyslipidemia   . Deviated nasal septum    Past Surgical History  Procedure Date  . Left knee arthroscopy 2003  . Right knee arthroscopy 2006   History reviewed. No pertinent family history. History   Social History  . Marital Status: Divorced    Spouse Name: N/A    Number of Children: N/A  . Years of Education: N/A   Occupational History  . Not on file.   Social History Main Topics  . Smoking status: Never Smoker   . Smokeless tobacco: Not on file  . Alcohol Use: Yes  . Drug Use: No  . Sexually Active:    Other Topics Concern  . Not on file   Social History Narrative  . No narrative on file   Allergies: Lisinopril  Meds: Scheduled Meds:   . albuterol  2.5 mg Nebulization Once  . aspirin  324 mg Oral Pre-Cath  . aspirin EC  325 mg Oral Daily  . atorvastatin   80 mg Oral q1800  . fentaNYL      . glipiZIDE  2.5 mg Oral BID AC  . hydrochlorothiazide  25 mg Oral Daily  . insulin aspart  0-9 Units Subcutaneous TID WC  . irbesartan  300 mg Oral Daily  . living well with diabetes book   Does not apply Once  . metoprolol succinate  50 mg Oral Daily  . midazolam      . pantoprazole  40 mg Oral Q1200  . pneumococcal 23 valent vaccine  0.5 mL Intramuscular Tomorrow-1000  . pramipexole  0.75 mg Oral BID  . pramipexole  0.75 mg Oral To Cath  . sodium chloride  3 mL Intravenous Q12H  . DISCONTD: aspirin  81 mg Oral Daily  . DISCONTD: aspirin  325 mg Oral Daily  . DISCONTD: olmesartan-hydrochlorothiazide  1 tablet Oral Daily   Continuous Infusions:   . sodium chloride 10 mL/hr at 10/24/11 0700  . sodium chloride    . heparin 1,800 Units/hr (10/24/11 0553)  . nitroGLYCERIN 3 mcg/min (10/24/11 0042)   PRN Meds:.acetaminophen, acetaminophen, morphine injection, nitroGLYCERIN, ondansetron (ZOFRAN) IV, ondansetron (ZOFRAN) IV, sodium chloride, DISCONTD: sodium chloride  Family history: Father had stroke.  Otherwise the mother and father alive.  Review of Systems: All systems reviewed with the patient and positive as per history of present illness, otherwise all other systems are negative.  Physical Exam: Blood pressure 101/71, pulse 53, temperature 97.6 F (36.4  C), temperature source Oral, resp. rate 18, height 5\' 9"  (1.753 m), weight 117.1 kg (258 lb 2.5 oz), SpO2 95.00%. General: Awake, Oriented x3, No acute distress. HEENT: EOMI, Moist mucous membranes Neck: Supple CV: S1 and S2 Lungs: Clear to ascultation bilaterally Abdomen: Soft, Nontender, Nondistended, +bowel sounds. Ext: Good pulses. Trace edema. No clubbing or cyanosis noted. Neuro: Cranial Nerves II-XII grossly intact. Has 5/5 motor strength in upper and lower extremities.  Lab results:  [facility] 10/24/11 0405 10/23/11 2034  NA 135 136  K 3.5 3.5  CL 97 96  CO2 28 27  GLUCOSE  161* 113*  BUN 18 17  CREATININE 1.13 1.06  CALCIUM 9.1 9.8  MG -- 2.0  PHOS -- --    Basename 10/23/11 2034  AST 19  ALT 22  ALKPHOS 78  BILITOT 0.9  PROT 7.7  ALBUMIN 4.2   No results found for this basename: LIPASE:2,AMYLASE:2 in the last 72 hours  Basename 10/24/11 1427  WBC 9.7  NEUTROABS --  HGB 16.1  HCT 45.6  MCV 85.7  PLT 218   No results found for this basename: CKTOTAL:3,CKMB:3,CKMBINDEX:3,TROPONINI:3 in the last 72 hours No components found with this basename: POCBNP:3 No results found for this basename: DDIMER in the last 72 hours  Basename 10/23/11 2034  HGBA1C 7.8*    Basename 10/24/11 0405  CHOL 127  HDL 41  LDLCALC 64  TRIG 111  CHOLHDL 3.1  LDLDIRECT --    Basename 10/23/11 2034  TSH 1.557  T4TOTAL --  T3FREE --  THYROIDAB --   No results found for this basename: VITAMINB12:2,FOLATE:2,FERRITIN:2,TIBC:2,IRON:2,RETICCTPCT:2 in the last 72 hours Imaging results:  No results found. Other results:  Assessment & Plan by Problem: New diagnosis of type 2 diabetes uncontrolled with complications Diabetic coronary already following the patient.  Start the patient on glipizide 2.5 mg po BID.  As the patient is hospitalized with further testing, hold off on starting the patient on metformin.  Start sliding scale insulin.  Change diet to diabetic diet.  Likely will need outpatient diabetes teaching and education, to be arranged by diabetic coordinator.  Severe 2 vessel coronary artery disease As per cardiology.  Thoracic surgery consultation pending for consideration of possible CABG.  Hypertension Stable.  Continue present antihypertensive medications.  Hyperlipidemia Continue statin.  GERD Continue PPI.  Obesity Continue diet and exercise as outpatient.  Prophylaxis Currently on heparin drip for coronary artery disease.  Disposition Pending.  Thank you for the consult.  Will continue to follow.  [name] A, MD 10/24/2011, 3:30  PM

## 2011-10-24 NOTE — Care Management Note (Unsigned)
    Page 1 of 1   10/28/2011     3:27:01 PM   CARE MANAGEMENT NOTE 10/28/2011  Patient:  James [name], James [name]   Account Number:  [id number]  Date Initiated:  10/24/2011  Documentation initiated by:  Junius Creamer  Subjective/Objective Assessment:   adm w ch pain, for cvts eval     Action/Plan:   lives w fam, pcp dr Molly Maduro reade   Anticipated DC Date:  11/01/2011   Anticipated DC Plan:  HOME W HOME HEALTH SERVICES      DC Planning Services  CM consult      Choice offered to / List presented to:             Status of service:  In process, will continue to follow Medicare Important Message given?   (If response is "NO", the following Medicare IM given date fields will be blank) Date Medicare IM given:   Date Additional Medicare IM given:    Discharge Disposition:    Per UR Regulation:  Reviewed for med. necessity/level of care/duration of stay  If discussed at Long Length of Stay Meetings, dates discussed:    Comments:  10/24/11 Hakop Humbarger,RN,BSN [phone]  [phone] PT S/P CABG X 4 ON 10/27/11.  PTA, PT INDEPENDENT, LIVES WITH PARENTS.  PARENTS TO PROVIDE 24HR CARE AT DISCHARGE.  WILL FOLLOW FOR HOME NEEDS AS PT PROGRESSES.  9/6 9:55a debbie dowell rn,bsn [phone]

## 2011-10-24 NOTE — Progress Notes (Signed)
1125 We received consult. Please advise on activity if pt to walk prior to intervention. We will continue to follow.Jaretzi Droz DunlapRN

## 2011-10-24 NOTE — Consult Note (Signed)
[address]            [place] [address]          [phone]       James [name] Medical Record #[id number] Date of Birth: [birth year]  No ref. provider found Lolita Patella, MD  Chief Complaint:   No chief complaint on file.  66 year old obese male newly diagnosed diabetic admitted following cardiac catheterization demonstrating severe multivessel coronary disease  History of Present Illness:     The patient has had recent progression of exertional shortness of breath and upper chest tightness. The symptoms occur walking up hills, doing treadmill workout at his gym, and are relieved by rest. He has had no resting symptoms of pain, orthopnea, PND,. He denies syncope. He denies family history of CAD. He is never smoked. He is recently been found to be diabetic with a hemoglobin A1c of 7.7. No history of other vascular disease, TIA, DVT. Cardiac catheterization performed yesterday by Dr. Mayford Knife demonstrates high-grade 95% proximal LAD stenosis, moderate proximal circumflex disease, high-grade stenosis of the second diagonal, high-grade diffuse disease of the right coronary artery. Ventriculogram and a 2-D echo show fairly well preserved LV function without significant valvular disease. LVEDP 14 mm mercury. Due to the three-vessel disease and his accelerating symptoms surgical coronary bypass grafting was recommended. The patient is currently stable on IV heparin.  Carotid Dopplers are pending. PFTs apparently having completed but are not available.  Current Activity/ Functional Status: Normal activity, placed cough, goes to the fitness center worked semi retired   Past Medical History  Diagnosis Date  . Hypertension   . Dyslipidemia   . Deviated nasal septum     Past Surgical History  Procedure Date  . Left knee arthroscopy 2003  . Right knee arthroscopy 2006    History  Smoking status  . Never Smoker   Smokeless  tobacco  . Not on file    History  Alcohol Use  . Yes    History   Social History  . Marital Status: Divorced    Spouse Name: N/A    Number of Children: N/A  . Years of Education: N/A   Occupational History  . Not on file.   Social History Main Topics  . Smoking status: Never Smoker   . Smokeless tobacco: Not on file  . Alcohol Use: Yes  . Drug Use: No  . Sexually Active:    Other Topics Concern  . Not on file   Social History Narrative  . No narrative on file    Allergies  Allergen Reactions  . Lisinopril     Current Facility-Administered Medications  Medication Dose Route Frequency Provider Last Rate Last Dose  . 0.9 %  sodium chloride infusion   Intravenous Continuous Quintella Reichert, MD 10 mL/hr at 10/24/11 1602    . 0.9 %  sodium chloride infusion  1 mL/kg/hr Intravenous Continuous Corky Crafts, MD      . acetaminophen (TYLENOL) tablet 650 mg  650 mg Oral Q4H PRN Quintella Reichert, MD      . acetaminophen (TYLENOL) tablet 650 mg  650 mg Oral Q4H PRN Corky Crafts, MD      . albuterol (PROVENTIL) (5 MG/ML) 0.5% nebulizer solution 2.5 mg  2.5 mg Nebulization Once Quintella Reichert, MD   2.5 mg at 10/24/11 1346  .  aspirin chewable tablet 324 mg  324 mg Oral Pre-Cath Quintella Reichert, MD   324 mg at 10/23/11 1440  . aspirin EC tablet 325 mg  325 mg Oral Daily Quintella Reichert, MD   325 mg at 10/24/11 1058  . atorvastatin (LIPITOR) tablet 80 mg  80 mg Oral q1800 Quintella Reichert, MD   80 mg at 10/24/11 1811  . glipiZIDE (GLUCOTROL) tablet 2.5 mg  2.5 mg Oral BID AC Srikar Cherlynn Kaiser, MD   2.5 mg at 10/24/11 1810  . heparin ADULT infusion 100 units/mL (25000 units/250 mL)  2,000 Units/hr Intravenous Continuous Severiano Gilbert, PHARMD 20 mL/hr at 10/24/11 1550 2,000 Units/hr at 10/24/11 1550  . heparin bolus via infusion 1,500 Units  1,500 Units Intravenous Once Severiano Gilbert, PHARMD   1,500 Units at 10/24/11 1551  . hydrochlorothiazide (HYDRODIURIL) tablet 25 mg   25 mg Oral Daily Quintella Reichert, MD   25 mg at 10/24/11 1058  . insulin aspart (novoLOG) injection 0-9 Units  0-9 Units Subcutaneous TID WC Srikar Cherlynn Kaiser, MD      . irbesartan (AVAPRO) tablet 300 mg  300 mg Oral Daily Quintella Reichert, MD   300 mg at 10/24/11 1058  . living well with diabetes book MISC   Does not apply Once Quintella Reichert, MD   1 each at 10/24/11 1811  . metoprolol succinate (TOPROL-XL) 24 hr tablet 50 mg  50 mg Oral Daily Quintella Reichert, MD   50 mg at 10/23/11 2202  . morphine 2 MG/ML injection 1 mg  1 mg Intravenous Q1H PRN Corky Crafts, MD      . nitroGLYCERIN (NITROSTAT) SL tablet 0.4 mg  0.4 mg Sublingual Q5 Min x 3 PRN Quintella Reichert, MD      . nitroGLYCERIN 0.2 mg/mL in dextrose 5 % infusion  3-30 mcg/min Intravenous Titrated Quintella Reichert, MD 0.9 mL/hr at 10/24/11 0042 3 mcg/min at 10/24/11 0042  . ondansetron (ZOFRAN) injection 4 mg  4 mg Intravenous Q6H PRN Quintella Reichert, MD      . ondansetron (ZOFRAN) injection 4 mg  4 mg Intravenous Q6H PRN Corky Crafts, MD      . pantoprazole (PROTONIX) EC tablet 40 mg  40 mg Oral Q1200 Quintella Reichert, MD   40 mg at 10/24/11 1216  . pneumococcal 23 valent vaccine (PNU-IMMUNE) injection 0.5 mL  0.5 mL Intramuscular Once Quintella Reichert, MD      . pramipexole (MIRAPEX) tablet 0.75 mg  0.75 mg Oral BID Quintella Reichert, MD   0.75 mg at 10/24/11 1058  . sodium chloride 0.9 % injection 3 mL  3 mL Intravenous Q12H Quintella Reichert, MD   3 mL at 10/24/11 1101  . sodium chloride 0.9 % injection 3 mL  3 mL Intravenous PRN Quintella Reichert, MD      . DISCONTD: aspirin chewable tablet 81 mg  81 mg Oral Daily Corky Crafts, MD      . DISCONTD: aspirin EC tablet 325 mg  325 mg Oral Daily Quintella Reichert, MD      . DISCONTD: heparin ADULT infusion 100 units/mL (25000 units/250 mL)  1,800 Units/hr Intravenous Continuous Quintella Reichert, MD 18 mL/hr at 10/24/11 0553 1,800 Units/hr at 10/24/11 0553  . DISCONTD:  olmesartan-hydrochlorothiazide (BENICAR HCT) 40-25 MG per tablet 1 tablet  1 tablet Oral Daily Quintella Reichert, MD      . DISCONTD:  pneumococcal 23 valent vaccine (PNU-IMMUNE) injection 0.5 mL  0.5 mL Intramuscular Tomorrow-1000 Quintella Reichert, MD         Family History  Problem Relation Age of Onset  . Stroke Father      Review of Systems:     Cardiac Review of Systems: Y or N  Chest Pain [  Y.  ]  Resting SOB [ N.  ] Exertional SOB  [Y.  ]  Orthopnea [N.  ]   Pedal Edema [ in  ]    Palpitations [ N. ] Syncope  [ N. ]   Presyncope [   ]  General Review of Systems: [Y] = yes [  ]=no Constitional: recent weight change [  ]; anorexia [  ]; fatigue [  ]; nausea [  ]; night sweats [  ]; fever [  ]; or chills [  ];                                                                                                                                          Dental: poor dentition[  ]; Last Dentist visi 1 year  Eye : blurred vision [  ]; diplopia [   ]; vision changes [  ];  Amaurosis fugax[  ]; Resp: cough [  ];  wheezing[  ];  hemoptysis[  ]; shortness of breath[  ]; paroxysmal nocturnal dyspnea[  ]; dyspnea on exertion[  ]; or orthopnea[  ];  GI:  gallstones[  ], vomiting[  ];  dysphagia[  ]; melena[  ];  hematochezia [  ]; heartburn[  ];   Hx of  Colonoscopy[  ]; GU: kidney stones [  ]; hematuria[  ];   dysuria [  ];  nocturia[  ];  history of     obstruction [  ];                 Skin: rash, swelling[  ];, hair loss[  ];  peripheral edema[  ];  or itching[  ]; Musculosketetal: myalgias[ Y. ];  joint swelling[  ];  joint erythema[  ]; status post bilateral knee arthroscopy  joint pain[  ];  back pain[  ];  Heme/Lymph: bruising[  ];  bleeding[N.  ];  anemia[  ];  Neuro: TIA[and  ];  headaches[  ];  stroke[  ];  vertigo[  ];  seizures[  ];   paresthesias[  ];  difficulty walking[  ];  Psych:depression[  ]; anxiety[  ];  Endocrine: diabetes[  ];  thyroid dysfunction[  ];  Immunizations: Flu [  ];  Pneumococcal[no but wishes immunizations during his hospitalization  ];  Other:  Physical Exam: BP 104/56  Pulse 54  Temp 97.8 F (36.6 C) (Oral)  Resp 18  Ht 5\' 9"  (1.753 m)  Wt 258 lb 2.5 oz (117.1 kg)  BMI 38.12 kg/m2  SpO2 96% Exam  Gen. alert comfortable no acute distress in the CCU HEENT normocephalic dentition good was equal Neck without JVD mass or carotid bruit Lymphatics without palpable adenopathy the neck Thorax without deformity breath sounds clear Cardiac regular rhythm without murmur or gallop Abdomen obese soft without organomegaly no palpable pulsatile mass Extremities mild to moderate stasis dermatitis from the midcalf to both feet, minimal pedal edema, positive peripheral pulses Neurologic alert no focal motor deficit     Diagnostic Studies & Laboratory data:   2-D echo, cardiac catheterization, chest x-ray, all reviewed with patient  Recent Radiology Findings:   No results found.    Recent Lab Findings: Lab Results  Component Value Date   WBC 9.7 10/24/2011   HGB 16.1 10/24/2011   HCT 45.6 10/24/2011   PLT 218 10/24/2011   GLUCOSE 161* 10/24/2011   CHOL 139 10/24/2011   TRIG 102 10/24/2011   HDL 47 10/24/2011   LDLCALC 72 10/24/2011   ALT 22 10/23/2011   AST 19 10/23/2011   NA 135 10/24/2011   K 3.5 10/24/2011   CL 97 10/24/2011   CREATININE 1.13 10/24/2011   BUN 18 10/24/2011   CO2 28 10/24/2011   TSH 1.557 10/23/2011   HGBA1C 7.8* 10/23/2011      Assessment / Plan:     Symptomatic severe three-vessel coronary disease with preserved LV function and a 66 year old obese diabetic male. Plan multivessel bypass grafting on Monday, September 9. Procedure discussed with patient and family.

## 2011-10-24 NOTE — Progress Notes (Signed)
ANTICOAGULATION CONSULT NOTE   Pharmacy Consult for Heparin Indication: CAD s/p cath  Patient Measurements: Height: 5\' 9"  (175.3 cm) Weight: 258 lb 2.5 oz (117.1 kg) IBW/kg (Calculated) : 70.7  Heparin Dosing Weight: 97 kg  Vital Signs: Temp: 98.3 F (36.8 C) (09/06 2000) Temp src: Oral (09/06 2000) BP: 110/61 mmHg (09/06 2100) Pulse Rate: 54  (09/06 1704)  Labs:  Basename 10/24/11 2145 10/24/11 1427 10/24/11 0405 10/23/11 2034  HGB -- 16.1 -- --  HCT -- 45.6 -- --  PLT -- 218 -- --  APTT -- -- -- --  LABPROT -- -- -- --  INR -- -- -- --  HEPARINUNFRC 0.56 0.25* <0.10* --  CREATININE -- -- 1.13 1.06  CKTOTAL -- -- -- --  CKMB -- -- -- --  TROPONINI -- -- -- --    Estimated Creatinine Clearance: 81.2 ml/min (by C-G formula based on Cr of 1.13).  Assessment: 66 y.o. Male with CAD s/p cath, on IV heparin. Plan for CABG on Monday. Heparin level (0.56) is at-goal on 2000 units/hr.   Goal of Therapy:  Heparin level 0.3-0.7 units/ml Monitor platelets by anticoagulation protocol: Yes   Plan:  1. Continue IV heparin at 2000 units/hr.  2. Daily heparin level, CBC.  Lorre Munroe, PharmD, BCPS 10/24/2011 11:40 PM

## 2011-10-24 NOTE — Progress Notes (Signed)
ANTICOAGULATION CONSULT NOTE   Pharmacy Consult for Heparin Indication: CAD s/p cath  Allergies  Allergen Reactions  . Lisinopril     Patient Measurements: Height: 5\' 9"  (175.3 cm) Weight: 258 lb 2.5 oz (117.1 kg) IBW/kg (Calculated) : 70.7  Heparin Dosing Weight: 97 kg  Vital Signs: Temp: 98.3 F (36.8 C) (09/06 0355) Temp src: Oral (09/06 0355) BP: 105/73 mmHg (09/06 0500) Pulse Rate: 52  (09/06 0500)  Labs:  Basename 10/24/11 0405 10/23/11 2034  HGB -- --  HCT -- --  PLT -- --  APTT -- --  LABPROT -- --  INR -- --  HEPARINUNFRC <0.10* --  CREATININE 1.13 1.06  CKTOTAL -- --  CKMB -- --  TROPONINI -- --    Estimated Creatinine Clearance: 81.2 ml/min (by C-G formula based on Cr of 1.13).  Assessment: 66 y.o. Male with CAD s/p cath, awaiting TCTS consult, for Heparin   Goal of Therapy:  Heparin level 0.3-0.7 units/ml Monitor platelets by anticoagulation protocol: Yes   Plan:  Increase Heparin 1800 units/hr Check heparin level in 8 hours.  Geannie Risen, PharmD, BCPS 10/24/2011 5:47 AM

## 2011-10-25 DIAGNOSIS — I10 Essential (primary) hypertension: Secondary | ICD-10-CM

## 2011-10-25 DIAGNOSIS — Z01810 Encounter for preprocedural cardiovascular examination: Secondary | ICD-10-CM

## 2011-10-25 DIAGNOSIS — E1165 Type 2 diabetes mellitus with hyperglycemia: Secondary | ICD-10-CM

## 2011-10-25 DIAGNOSIS — I2510 Atherosclerotic heart disease of native coronary artery without angina pectoris: Principal | ICD-10-CM

## 2011-10-25 LAB — CBC
HCT: 43.2 % (ref 39.0–52.0)
Hemoglobin: 15.1 g/dL (ref 13.0–17.0)
MCV: 85.5 fL (ref 78.0–100.0)
RBC: 5.05 MIL/uL (ref 4.22–5.81)
WBC: 10.4 10*3/uL (ref 4.0–10.5)

## 2011-10-25 LAB — GLUCOSE, CAPILLARY
Glucose-Capillary: 120 mg/dL — ABNORMAL HIGH (ref 70–99)
Glucose-Capillary: 107 mg/dL — ABNORMAL HIGH (ref 70–99)
Glucose-Capillary: 96 mg/dL (ref 70–99)

## 2011-10-25 LAB — HEPARIN LEVEL (UNFRACTIONATED): Heparin Unfractionated: 0.7 IU/mL (ref 0.30–0.70)

## 2011-10-25 NOTE — Progress Notes (Addendum)
ANTICOAGULATION CONSULT NOTE   Pharmacy Consult for Heparin Indication: CAD s/p cath plan CABG  Patient Measurements: Height: 5\' 9"  (175.3 cm) Weight: 258 lb 2.5 oz (117.1 kg) IBW/kg (Calculated) : 70.7  Heparin Dosing Weight: 97 kg  Vital Signs: Temp: 99 F (37.2 C) (09/07 0804) Temp src: Oral (09/07 0804) BP: 118/75 mmHg (09/07 0800)  Labs:  Basename 10/25/11 0615 10/24/11 2145 10/24/11 1427 10/24/11 0405 10/23/11 2034  HGB 15.1 -- 16.1 -- --  HCT 43.2 -- 45.6 -- --  PLT 220 -- 218 -- --  APTT -- -- -- -- --  LABPROT -- -- -- -- --  INR -- -- -- -- --  HEPARINUNFRC 0.70 0.56 0.25* -- --  CREATININE -- -- -- 1.13 1.06  CKTOTAL -- -- -- -- --  CKMB -- -- -- -- --  TROPONINI -- -- -- -- --    Estimated Creatinine Clearance: 81.2 ml/min (by C-G formula based on Cr of 1.13).  Assessment: 66 y.o. Male with CAD s/p cath with plans for CABG Monday. Heparin drip 2000 uts/hr HL 0.7 at upper end of goal range and quick jump from last pm level.  Goal of Therapy:  Heparin level 0.3-0.7 units/ml Monitor platelets by anticoagulation protocol: Yes   Plan:  1. Decrease IV heparin at 1900 units/hr.  2. Daily heparin level, CBC.  Leota Sauers Pharm.D. CPP, BCPS Clinical Pharmacist [phone] 10/25/2011 10:49 AM

## 2011-10-25 NOTE — Progress Notes (Addendum)
Pre-op Cardiac Surgery  Carotid Findings:  No ICA stenosis.  Vertebral artery flow is antegrade.  Upper Extremity Right Left  Brachial Pressures 131 140  Radial Waveforms Tri  Tri   Ulnar Waveforms Tri  Tri   Palmar Arch (Allen's Test) Normal with radial compression, obliterates with ulnar compression Normal with radial compression, obliterates with ulnar compression   Findings:  Bilateral palpable pedal pulses.   Sherren Kerns, RVS Farrel Demark, RVT , RDMS

## 2011-10-25 NOTE — Progress Notes (Signed)
Subjective: No specific complaints. Denies any pain.  Objective: Vital signs in last 24 hours: Filed Vitals:   10/25/11 0000 10/25/11 0400 10/25/11 0800 10/25/11 0804  BP: 100/63 99/51 118/75   Pulse:      Temp: 98.5 F (36.9 C) 98 F (36.7 C)  99 F (37.2 C)  TempSrc: Oral   Oral  Resp: 18 18  18   Height:      Weight:      SpO2: 94%   94%   Weight change:   Intake/Output Summary (Last 24 hours) at 10/25/11 0851 Last data filed at 10/25/11 0700  Gross per 24 hour  Intake 1377.33 ml  Output   1550 ml  Net -172.67 ml    Physical Exam: General: Awake, Oriented, No acute distress. HEENT: EOMI. Neck: Supple CV: S1 and S2 Lungs: Clear to ascultation bilaterally Abdomen: Soft, Nontender, Nondistended, +bowel sounds. Ext: Good pulses. Trace edema.  Lab Results: Basic Metabolic Panel:  Lab 10/24/11 5284 10/23/11 2034  NA 135 136  K 3.5 3.5  CL 97 96  CO2 28 27  GLUCOSE 161* 113*  BUN 18 17  CREATININE 1.13 1.06  CALCIUM 9.1 9.8  MG -- 2.0  PHOS -- --   Liver Function Tests:  Lab 10/23/11 2034  AST 19  ALT 22  ALKPHOS 78  BILITOT 0.9  PROT 7.7  ALBUMIN 4.2   No results found for this basename: LIPASE:5,AMYLASE:5 in the last 168 hours No results found for this basename: AMMONIA:5 in the last 168 hours CBC:  Lab 10/25/11 0615 10/24/11 1427  WBC 10.4 9.7  NEUTROABS -- --  HGB 15.1 16.1  HCT 43.2 45.6  MCV 85.5 85.7  PLT 220 218   Cardiac Enzymes: No results found for this basename: CKTOTAL:5,CKMB:5,CKMBINDEX:5,TROPONINI:5 in the last 168 hours BNP (last 3 results) No results found for this basename: PROBNP:3 in the last 8760 hours CBG:  Lab 10/25/11 0802 10/24/11 2137 10/24/11 1707 10/24/11 1221  GLUCAP 120* 119* 98 120*    Basename 10/23/11 2034  HGBA1C 7.8*   Other Labs: No components found with this basename: POCBNP:3 No results found for this basename: DDIMER:2 in the last 168 hours  Lab 10/24/11 1428 10/24/11 0405  CHOL 139 127    HDL 47 41  LDLCALC 72 64  TRIG 102 111  CHOLHDL 3.0 3.1  LDLDIRECT -- --    Lab 10/23/11 2034  TSH 1.557  T4TOTAL --  T3FREE --  FREET4 --  THYROIDAB --   No results found for this basename: VITAMINB12:2,FOLATE:2,FERRITIN:2,TIBC:2,IRON:2,RETICCTPCT:2 in the last 168 hours  Micro Results: Recent Results (from the past 240 hour(s))  MRSA PCR SCREENING     Status: Normal   Collection Time   10/23/11  6:55 PM      Component Value Range Status Comment   MRSA by PCR NEGATIVE  NEGATIVE Final   SURGICAL PCR SCREEN     Status: Normal   Collection Time   10/24/11  7:31 PM      Component Value Range Status Comment   MRSA, PCR NEGATIVE  NEGATIVE Final    Staphylococcus aureus NEGATIVE  NEGATIVE Final     Studies/Results: No results found.  Medications: I have reviewed the patient's current medications. Scheduled Meds:   . albuterol  2.5 mg Nebulization Once  . aspirin  324 mg Oral Pre-Cath  . aspirin EC  325 mg Oral Daily  . atorvastatin  80 mg Oral q1800  . glipiZIDE  2.5 mg Oral BID  AC  . heparin  1,500 Units Intravenous Once  . hydrochlorothiazide  25 mg Oral Daily  . insulin aspart  0-9 Units Subcutaneous TID WC  . irbesartan  300 mg Oral Daily  . living well with diabetes book   Does not apply Once  . metoprolol succinate  50 mg Oral Daily  . pantoprazole  40 mg Oral Q1200  . pneumococcal 23 valent vaccine  0.5 mL Intramuscular Once  . pramipexole  0.75 mg Oral BID  . sodium chloride  3 mL Intravenous Q12H  . DISCONTD: pneumococcal 23 valent vaccine  0.5 mL Intramuscular Tomorrow-1000   Continuous Infusions:   . sodium chloride 10 mL/hr at 10/24/11 1602  . heparin 2,000 Units/hr (10/25/11 0343)  . nitroGLYCERIN 3 mcg/min (10/24/11 0042)  . DISCONTD: heparin 1,800 Units/hr (10/24/11 0553)   PRN Meds:.acetaminophen, acetaminophen, LORazepam, morphine injection, nitroGLYCERIN, ondansetron (ZOFRAN) IV, ondansetron (ZOFRAN) IV, sodium chloride  Assessment/Plan: New  diagnosis of type 2 diabetes uncontrolled with complications  Continue glipizide 2.5 mg po BID. At discharge if renal function is normal, consider placing the patient on metformin 500 mg BID and to be adjusted as per primary care physician. Continue sliding scale insulin. Continue diabetic diet. Likely will need outpatient diabetes teaching and education, to be arranged by diabetic coordinator at discharge.  Severe 2 vessel coronary artery disease  As per cardiology. Thoracic surgery planning on CABG on 10/27/2011.   Hypertension  Stable. Continue present antihypertensive medications.   Hyperlipidemia  Continue statin.   GERD  Continue PPI.   Obesity  Continue diet and exercise as outpatient.   Prophylaxis  Currently on heparin drip for coronary artery disease.   Disposition  Pending.   Will continue to follow.   LOS: 2 days  James [name] A, MD 10/25/2011, 8:51 AM

## 2011-10-25 NOTE — Progress Notes (Signed)
SUBJECTIVE: The patient is doing well today.  At this time, he denies chest pain, shortness of breath, or any new concerns.     Marland Kitchen albuterol  2.5 mg Nebulization Once  . aspirin  324 mg Oral Pre-Cath  . aspirin EC  325 mg Oral Daily  . atorvastatin  80 mg Oral q1800  . glipiZIDE  2.5 mg Oral BID AC  . heparin  1,500 Units Intravenous Once  . hydrochlorothiazide  25 mg Oral Daily  . insulin aspart  0-9 Units Subcutaneous TID WC  . irbesartan  300 mg Oral Daily  . living well with diabetes book   Does not apply Once  . metoprolol succinate  50 mg Oral Daily  . pantoprazole  40 mg Oral Q1200  . pneumococcal 23 valent vaccine  0.5 mL Intramuscular Once  . pramipexole  0.75 mg Oral BID  . sodium chloride  3 mL Intravenous Q12H  . DISCONTD: pneumococcal 23 valent vaccine  0.5 mL Intramuscular Tomorrow-1000      . sodium chloride 10 mL/hr at 10/24/11 1602  . heparin 2,000 Units/hr (10/25/11 0343)  . nitroGLYCERIN 3 mcg/min (10/24/11 0042)  . DISCONTD: heparin 1,800 Units/hr (10/24/11 0553)    OBJECTIVE: Physical Exam: Filed Vitals:   10/25/11 0000 10/25/11 0400 10/25/11 0800 10/25/11 0804  BP: 100/63 99/51 118/75   Pulse:      Temp: 98.5 F (36.9 C) 98 F (36.7 C) 99 F (37.2 C) 99 F (37.2 C)  TempSrc: Oral  Oral Oral  Resp: 18 18 18 18   Height:      Weight:      SpO2: 94%  94% 94%    Intake/Output Summary (Last 24 hours) at 10/25/11 1003 Last data filed at 10/25/11 0900  Gross per 24 hour  Intake 1381.33 ml  Output   1375 ml  Net   6.33 ml    Telemetry reveals sinus rhythm  GEN- The patient is well appearing, alert and oriented x 3 today.   Head- normocephalic, atraumatic Eyes-  Sclera clear, conjunctiva pink Ears- hearing intact Oropharynx- clear Neck- supple, no JVP Lymph- no cervical lymphadenopathy Lungs- Clear to ausculation bilaterally, normal work of breathing Heart- Regular rate and rhythm, no murmurs, rubs or gallops, PMI not laterally  displaced GI- soft, NT, ND, + BS Extremities- no clubbing, cyanosis, or edema   LABS: Basic Metabolic Panel:  Basename 10/24/11 0405 10/23/11 2034  NA 135 136  K 3.5 3.5  CL 97 96  CO2 28 27  GLUCOSE 161* 113*  BUN 18 17  CREATININE 1.13 1.06  CALCIUM 9.1 9.8  MG -- 2.0  PHOS -- --   Liver Function Tests:  [facility] 10/23/11 2034  AST 19  ALT 22  ALKPHOS 78  BILITOT 0.9  PROT 7.7  ALBUMIN 4.2   No results found for this basename: LIPASE:2,AMYLASE:2 in the last 72 hours CBC:  Basename 10/25/11 0615 10/24/11 1427  WBC 10.4 9.7  NEUTROABS -- --  HGB 15.1 16.1  HCT 43.2 45.6  MCV 85.5 85.7  PLT 220 218   Cardiac Enzymes: No results found for this basename: CKTOTAL:3,CKMB:3,CKMBINDEX:3,TROPONINI:3 in the last 72 hours BNP: No components found with this basename: POCBNP:3 D-Dimer: No results found for this basename: DDIMER:2 in the last 72 hours Hemoglobin A1C:  Basename 10/23/11 2034  HGBA1C 7.8*   Fasting Lipid Panel:  Basename 10/24/11 1428  CHOL 139  HDL 47  LDLCALC 72  TRIG 102  CHOLHDL 3.0  LDLDIRECT --  Thyroid Function Tests:  [facility] 10/23/11 2034  TSH 1.557  T4TOTAL --  T3FREE --  THYROIDAB --   EKG today is reviewed  ASSESSMENT AND PLAN:  Principal Problem:  *Coronary artery disease Active Problems:  Hypertension  Hyperlipidemia  DM (diabetes mellitus), type 2, uncontrolled with complications  GERD (gastroesophageal reflux disease)  1. Severe 3V CAD- keep in stepdown over the weekend on heparin gtt CABG planned for Monday  2. DM- appreciate medicine input  3. HTN- stable   Hillis Range, MD 10/25/2011 10:03 AM

## 2011-10-26 ENCOUNTER — Inpatient Hospital Stay (HOSPITAL_COMMUNITY): Payer: Medicare Other

## 2011-10-26 DIAGNOSIS — Z01810 Encounter for preprocedural cardiovascular examination: Secondary | ICD-10-CM

## 2011-10-26 LAB — BASIC METABOLIC PANEL
BUN: 21 mg/dL (ref 6–23)
CO2: 21 mEq/L (ref 19–32)
Calcium: 9.5 mg/dL (ref 8.4–10.5)
Chloride: 99 mEq/L (ref 96–112)
Creatinine, Ser: 1.2 mg/dL (ref 0.50–1.35)
GFR calc Af Amer: 71 mL/min — ABNORMAL LOW (ref >90)
GFR calc non Af Amer: 61 mL/min — ABNORMAL LOW (ref >90)
Glucose, Bld: 111 mg/dL — ABNORMAL HIGH (ref 70–99)
Potassium: 4.1 mEq/L (ref 3.5–5.1)
Sodium: 133 mEq/L — ABNORMAL LOW (ref 135–145)

## 2011-10-26 LAB — CBC
HCT: 42.5 % (ref 39.0–52.0)
HCT: 44.3 % (ref 39.0–52.0)
Hemoglobin: 15.6 g/dL (ref 13.0–17.0)
MCH: 29.9 pg (ref 26.0–34.0)
MCHC: 34.8 g/dL (ref 30.0–36.0)
MCHC: 35.2 g/dL (ref 30.0–36.0)
MCV: 85.5 fL (ref 78.0–100.0)
MCV: 85 fL (ref 78.0–100.0)
Platelets: 210 10*3/uL (ref 150–400)
RBC: 5.21 MIL/uL (ref 4.22–5.81)
RDW: 13.1 % (ref 11.5–15.5)
RDW: 13.1 % (ref 11.5–15.5)
WBC: 9.6 10*3/uL (ref 4.0–10.5)

## 2011-10-26 LAB — PROTIME-INR
INR: 1.08 (ref 0.00–1.49)
Prothrombin Time: 14.2 seconds (ref 11.6–15.2)

## 2011-10-26 LAB — POCT I-STAT 3, ART BLOOD GAS (G3+)
pCO2 arterial: 34.7 mmHg — ABNORMAL LOW (ref 35.0–45.0)
pH, Arterial: 7.469 — ABNORMAL HIGH (ref 7.350–7.450)

## 2011-10-26 LAB — TYPE AND SCREEN
ABO/RH(D): A POS
Antibody Screen: NEGATIVE

## 2011-10-26 LAB — APTT: aPTT: 156 seconds — ABNORMAL HIGH (ref 24–37)

## 2011-10-26 LAB — HEPARIN LEVEL (UNFRACTIONATED): Heparin Unfractionated: 0.38 IU/mL (ref 0.30–0.70)

## 2011-10-26 LAB — GLUCOSE, CAPILLARY: Glucose-Capillary: 118 mg/dL — ABNORMAL HIGH (ref 70–99)

## 2011-10-26 MED ORDER — SODIUM CHLORIDE 0.9 % IV SOLN
INTRAVENOUS | Status: AC
  Administered 2011-10-27: 1 [IU]/h via INTRAVENOUS
  Filled 2011-10-26: qty 1

## 2011-10-26 MED ORDER — DEXMEDETOMIDINE HCL IN NACL 400 MCG/100ML IV SOLN
0.1000 ug/kg/h | INTRAVENOUS | Status: AC
  Administered 2011-10-27: 0.2 ug/kg/h via INTRAVENOUS
  Filled 2011-10-26: qty 100

## 2011-10-26 MED ORDER — METOPROLOL TARTRATE 12.5 MG HALF TABLET
12.5000 mg | ORAL_TABLET | Freq: Once | ORAL | Status: AC
  Administered 2011-10-27: 12.5 mg via ORAL
  Filled 2011-10-26: qty 1

## 2011-10-26 MED ORDER — NITROGLYCERIN IN D5W 200-5 MCG/ML-% IV SOLN
2.0000 ug/min | INTRAVENOUS | Status: DC
  Filled 2011-10-26: qty 250

## 2011-10-26 MED ORDER — TRANEXAMIC ACID (OHS) PUMP PRIME SOLUTION
2.0000 mg/kg | INTRAVENOUS | Status: DC
  Filled 2011-10-26: qty 2.34

## 2011-10-26 MED ORDER — TRANEXAMIC ACID 100 MG/ML IV SOLN
1.5000 mg/kg/h | INTRAVENOUS | Status: AC
  Administered 2011-10-27: 1.5 mg/kg/h via INTRAVENOUS
  Filled 2011-10-26: qty 25

## 2011-10-26 MED ORDER — PHENYLEPHRINE HCL 10 MG/ML IJ SOLN
30.0000 ug/min | INTRAVENOUS | Status: AC
  Administered 2011-10-27: 50 ug/min via INTRAVENOUS
  Filled 2011-10-26: qty 2

## 2011-10-26 MED ORDER — HEPARIN (PORCINE) IN NACL 100-0.45 UNIT/ML-% IJ SOLN
1600.0000 [IU]/h | INTRAMUSCULAR | Status: DC
  Administered 2011-10-26: 1600 [IU]/h via INTRAVENOUS
  Filled 2011-10-26 (×5): qty 250

## 2011-10-26 MED ORDER — DEXTROSE 5 % IV SOLN
750.0000 mg | INTRAVENOUS | Status: DC
  Filled 2011-10-26: qty 750

## 2011-10-26 MED ORDER — CHLORHEXIDINE GLUCONATE 4 % EX LIQD
60.0000 mL | Freq: Once | CUTANEOUS | Status: AC
  Administered 2011-10-26: 4 via TOPICAL
  Filled 2011-10-26: qty 60

## 2011-10-26 MED ORDER — POTASSIUM CHLORIDE 2 MEQ/ML IV SOLN
80.0000 meq | INTRAVENOUS | Status: DC
  Filled 2011-10-26: qty 40

## 2011-10-26 MED ORDER — SODIUM BICARBONATE 8.4 % IV SOLN
INTRAVENOUS | Status: AC
  Administered 2011-10-27: 10:00:00
  Filled 2011-10-26 (×2): qty 2.5

## 2011-10-26 MED ORDER — DEXTROSE 5 % IV SOLN
1.5000 g | INTRAVENOUS | Status: AC
  Administered 2011-10-27: 1.5 g via INTRAVENOUS
  Administered 2011-10-27: .75 g via INTRAVENOUS
  Filled 2011-10-26: qty 1.5

## 2011-10-26 MED ORDER — EPINEPHRINE HCL 1 MG/ML IJ SOLN
0.5000 ug/min | INTRAMUSCULAR | Status: DC
  Filled 2011-10-26: qty 4

## 2011-10-26 MED ORDER — BISACODYL 5 MG PO TBEC
5.0000 mg | DELAYED_RELEASE_TABLET | Freq: Once | ORAL | Status: AC
  Administered 2011-10-26: 5 mg via ORAL
  Filled 2011-10-26: qty 1

## 2011-10-26 MED ORDER — MAGNESIUM SULFATE 50 % IJ SOLN
40.0000 meq | INTRAMUSCULAR | Status: DC
  Filled 2011-10-26: qty 10

## 2011-10-26 MED ORDER — TRANEXAMIC ACID (OHS) BOLUS VIA INFUSION
15.0000 mg/kg | INTRAVENOUS | Status: AC
  Administered 2011-10-27: 1756.5 mg via INTRAVENOUS
  Filled 2011-10-26 (×2): qty 1757

## 2011-10-26 MED ORDER — TEMAZEPAM 15 MG PO CAPS
15.0000 mg | ORAL_CAPSULE | Freq: Once | ORAL | Status: AC | PRN
  Administered 2011-10-26: 15 mg via ORAL
  Filled 2011-10-26: qty 1

## 2011-10-26 MED ORDER — DOPAMINE-DEXTROSE 3.2-5 MG/ML-% IV SOLN
2.0000 ug/kg/min | INTRAVENOUS | Status: AC
  Administered 2011-10-27: 3 ug/kg/min via INTRAVENOUS
  Filled 2011-10-26: qty 250

## 2011-10-26 MED ORDER — VANCOMYCIN HCL 1000 MG IV SOLR
1500.0000 mg | INTRAVENOUS | Status: AC
  Administered 2011-10-27: 1500 mg via INTRAVENOUS
  Filled 2011-10-26: qty 1500

## 2011-10-26 NOTE — Progress Notes (Signed)
Subjective: No specific complaints. Denies any pain.  Objective: Vital signs in last 24 hours: Filed Vitals:   10/25/11 1945 10/25/11 2300 10/26/11 0350 10/26/11 0751  BP: 109/66 94/54 111/59 125/67  Pulse:      Temp: 98.7 F (37.1 C) 98.8 F (37.1 C) 97.6 F (36.4 C) 98.2 F (36.8 C)  TempSrc: Oral Oral Oral   Resp: 18 16 16    Height:      Weight:      SpO2: 95% 95% 95% 93%   Weight change:   Intake/Output Summary (Last 24 hours) at 10/26/11 0917 Last data filed at 10/26/11 0600  Gross per 24 hour  Intake 825.02 ml  Output    250 ml  Net 575.02 ml    Physical Exam: General: Awake, Oriented, No acute distress. HEENT: EOMI. Neck: Supple CV: S1 and S2 Lungs: Clear to ascultation bilaterally Abdomen: Soft, Nontender, Nondistended, +bowel sounds. Ext: Good pulses. Trace edema.  Lab Results: Basic Metabolic Panel:  Lab 10/24/11 1610 10/23/11 2034  NA 135 136  K 3.5 3.5  CL 97 96  CO2 28 27  GLUCOSE 161* 113*  BUN 18 17  CREATININE 1.13 1.06  CALCIUM 9.1 9.8  MG -- 2.0  PHOS -- --   Liver Function Tests:  Lab 10/23/11 2034  AST 19  ALT 22  ALKPHOS 78  BILITOT 0.9  PROT 7.7  ALBUMIN 4.2   No results found for this basename: LIPASE:5,AMYLASE:5 in the last 168 hours No results found for this basename: AMMONIA:5 in the last 168 hours CBC:  Lab 10/26/11 0600 10/25/11 0615 10/24/11 1427  WBC 9.5 10.4 9.7  NEUTROABS -- -- --  HGB 14.8 15.1 16.1  HCT 42.5 43.2 45.6  MCV 85.5 85.5 85.7  PLT 213 220 218   Cardiac Enzymes: No results found for this basename: CKTOTAL:5,CKMB:5,CKMBINDEX:5,TROPONINI:5 in the last 168 hours BNP (last 3 results) No results found for this basename: PROBNP:3 in the last 8760 hours CBG:  Lab 10/25/11 2116 10/25/11 1157 10/25/11 0802 10/24/11 2137 10/24/11 1707  GLUCAP 96 107* 120* 119* 98    Basename 10/23/11 2034  HGBA1C 7.8*   Other Labs: No components found with this basename: POCBNP:3 No results found for this  basename: DDIMER:2 in the last 168 hours  Lab 10/24/11 1428 10/24/11 0405  CHOL 139 127  HDL 47 41  LDLCALC 72 64  TRIG 102 111  CHOLHDL 3.0 3.1  LDLDIRECT -- --    Lab 10/23/11 2034  TSH 1.557  T4TOTAL --  T3FREE --  FREET4 --  THYROIDAB --   No results found for this basename: VITAMINB12:2,FOLATE:2,FERRITIN:2,TIBC:2,IRON:2,RETICCTPCT:2 in the last 168 hours  Micro Results: Recent Results (from the past 240 hour(s))  MRSA PCR SCREENING     Status: Normal   Collection Time   10/23/11  6:55 PM      Component Value Range Status Comment   MRSA by PCR NEGATIVE  NEGATIVE Final   SURGICAL PCR SCREEN     Status: Normal   Collection Time   10/24/11  7:31 PM      Component Value Range Status Comment   MRSA, PCR NEGATIVE  NEGATIVE Final    Staphylococcus aureus NEGATIVE  NEGATIVE Final     Studies/Results: No results found.  Medications: I have reviewed the patient's current medications. Scheduled Meds:    . aspirin EC  325 mg Oral Daily  . atorvastatin  80 mg Oral q1800  . cefUROXime (ZINACEF)  IV  1.5 g  Intravenous To OR  . cefUROXime (ZINACEF)  IV  750 mg Intravenous To OR  . dexmedetomidine  0.1-0.7 mcg/kg/hr Intravenous To OR  . DOPamine  2-20 mcg/kg/min Intravenous To OR  . epinephrine  0.5-20 mcg/min Intravenous To OR  . glipiZIDE  2.5 mg Oral BID AC  . hydrochlorothiazide  25 mg Oral Daily  . insulin aspart  0-9 Units Subcutaneous TID WC  . insulin (NOVOLIN-R) infusion   Intravenous To OR  . irbesartan  300 mg Oral Daily  . magnesium sulfate  40 mEq Other To OR  . metoprolol succinate  50 mg Oral Daily  . nitroGLYCERIN  2-200 mcg/min Intravenous To OR  . nitroglycerin-nicardipine-HEPARIN-sodium bicarbonate irrigation for artery spasm   Irrigation To OR  . pantoprazole  40 mg Oral Q1200  . phenylephrine (NEO-SYNEPHRINE) Adult infusion  30-200 mcg/min Intravenous To OR  . pneumococcal 23 valent vaccine  0.5 mL Intramuscular Once  . potassium chloride  80 mEq Other  To OR  . pramipexole  0.75 mg Oral BID  . sodium chloride  3 mL Intravenous Q12H  . tranexamic acid  15 mg/kg Intravenous To OR  . tranexamic acid  2 mg/kg Intracatheter To OR  . tranexamic acid (CYKLOKAPRON) infusion ([facility])  1.5 mg/kg/hr Intravenous To OR  . vancomycin  1,500 mg Intravenous To OR   Continuous Infusions:    . sodium chloride Stopped (10/26/11 0800)  . heparin Stopped (10/26/11 0800)  . nitroGLYCERIN 3 mcg/min (10/24/11 0042)  . DISCONTD: heparin 1,900 Units/hr (10/25/11 1836)   PRN Meds:.acetaminophen, acetaminophen, LORazepam, morphine injection, nitroGLYCERIN, ondansetron (ZOFRAN) IV, ondansetron (ZOFRAN) IV, sodium chloride  Assessment/Plan: New diagnosis of type 2 diabetes uncontrolled with complications  Continue glipizide 2.5 mg po BID. At discharge if renal function is normal, consider placing the patient on metformin 500 mg BID with glipizide and to be adjusted as per primary care physician. Continue sliding scale insulin. Continue diabetic diet. Likely will need outpatient diabetes teaching and education, to be arranged by diabetic coordinator at discharge.  Severe 3 vessel coronary artery disease  As per cardiology. Thoracic surgery planning on CABG on 10/27/2011.   Hypertension  Stable. Continue present antihypertensive medications.   Hyperlipidemia  Continue statin.   GERD  Continue PPI.   Obesity  Continue diet and exercise as outpatient.   Prophylaxis  Currently on heparin drip for coronary artery disease.   Disposition  Pending.   As his diabetes is stable at this time, will sign off for now. Please do not hesitate to call us with any questions or concerns.    LOS: 3 days  James Enberg A, MD 10/26/2011, 9:17 AM

## 2011-10-26 NOTE — Progress Notes (Signed)
ANTICOAGULATION CONSULT NOTE   Pharmacy Consult for Heparin Indication: CAD s/p cath plan CABG  Patient Measurements: Height: 5\' 9"  (175.3 cm) Weight: 258 lb 2.5 oz (117.1 kg) IBW/kg (Calculated) : 70.7  Heparin Dosing Weight: 97 kg  Vital Signs: Temp: 97.6 F (36.4 C) (09/08 0350) Temp src: Oral (09/08 0350) BP: 111/59 mmHg (09/08 0350)  Labs:  Basename 10/26/11 0600 10/25/11 0615 10/24/11 2145 10/24/11 1427 10/24/11 0405 10/23/11 2034  HGB 14.8 15.1 -- -- -- --  HCT 42.5 43.2 -- 45.6 -- --  PLT 213 220 -- 218 -- --  APTT 156* -- -- -- -- --  LABPROT 14.2 -- -- -- -- --  INR 1.08 -- -- -- -- --  HEPARINUNFRC 1.06* 0.70 0.56 -- -- --  CREATININE -- -- -- -- 1.13 1.06  CKTOTAL -- -- -- -- -- --  CKMB -- -- -- -- -- --  TROPONINI -- -- -- -- -- --    Estimated Creatinine Clearance: 81.2 ml/min (by C-G formula based on Cr of 1.13).  Assessment: 66 y.o. Male with CAD s/p cath with plans for CABG on Monday. Heparin drip decreased to 1900 uts/hr (HL 0.7) yesterday. Patient continues to accumulate and his level is supratherapeutic this morning, but no bleeding issues were noted by the patient.  Goal of Therapy:  Heparin level 0.3-0.7 units/ml Monitor platelets by anticoagulation protocol: Yes   Plan:  1. Decrease IV heparin at 1600 units/hr.  2. Recheck heparin level this afternoon 3. CABG in am  Sheppard Coil PharmD, BCPS Clinical Pharmacist [phone] 10/26/2011 7:23 AM

## 2011-10-26 NOTE — Progress Notes (Signed)
Patient stable on IV heparin was severe three-vessel CAD Blood sugars well-controlled now Multivessel CABG scheduled in a.m., procedure reviewed with patient.

## 2011-10-26 NOTE — Progress Notes (Signed)
ANTICOAGULATION CONSULT NOTE - Follow Up Consult  Pharmacy Consult for Heparin Indication: Post cath awaiting CABG on Monday, 9/9  Allergies  Allergen Reactions  . Lisinopril     Patient Measurements: Height: 5\' 9"  (175.3 cm) Weight: 258 lb 2.5 oz (117.1 kg) IBW/kg (Calculated) : 70.7  Heparin Dosing Weight: 97 kg  Vital Signs: Temp: 98.2 F (36.8 C) (09/08 1536) Temp src: Oral (09/08 1536) BP: 122/72 mmHg (09/08 1535) Pulse Rate: 63  (09/08 0929)  Labs:  Basename 10/26/11 1629 10/26/11 0600 10/25/11 0615 10/24/11 1427 10/24/11 0405 10/23/11 2034  HGB -- 14.8 15.1 -- -- --  HCT -- 42.5 43.2 45.6 -- --  PLT -- 213 220 218 -- --  APTT -- 156* -- -- -- --  LABPROT -- 14.2 -- -- -- --  INR -- 1.08 -- -- -- --  HEPARINUNFRC 0.38 1.06* 0.70 -- -- --  CREATININE -- -- -- -- 1.13 1.06  CKTOTAL -- -- -- -- -- --  CKMB -- -- -- -- -- --  TROPONINI -- -- -- -- -- --    Estimated Creatinine Clearance: 81.2 ml/min (by C-G formula based on Cr of 1.13).   Medications:  Infusions:    . sodium chloride 10 mL/hr at 10/26/11 1000  . heparin 1,600 Units/hr (10/26/11 0933)  . nitroGLYCERIN 3 mcg/min (10/24/11 0042)  . DISCONTD: heparin 1,900 Units/hr (10/25/11 1836)    Assessment: 66 y.o. M found to have severe CAD during cardiac cath on 9/5 and is now awaiting CABG planned for Monday, 9/9. Heparin level this evening is therapeutic. Hgb/Hct/Plt stable. No s/sx of bleeding noted at this time. Will re-check a level in 6 hours to confirm it remains therapeutic since the patient has required several dose adjustments this admission.   Goal of Therapy:  Heparin level 0.3-0.7 units/ml Monitor platelets by anticoagulation protocol: Yes   Plan:  1. Continue heparin at current drip rate of 1600 units/hr (16 ml/hr) 2. Will continue to monitor for any signs/symptoms of bleeding and will follow up with heparin level in 6 hours to confirm  Georgina Pillion, PharmD, BCPS Clinical  Pharmacist Pager: [phone] 10/26/2011 5:27 PM

## 2011-10-26 NOTE — Progress Notes (Signed)
SUBJECTIVE: The patient is doing well today.  At this time, he denies chest pain, shortness of breath, or any new concerns.     Marland Kitchen aspirin EC  325 mg Oral Daily  . atorvastatin  80 mg Oral q1800  . cefUROXime (ZINACEF)  IV  1.5 g Intravenous To OR  . cefUROXime (ZINACEF)  IV  750 mg Intravenous To OR  . dexmedetomidine  0.1-0.7 mcg/kg/hr Intravenous To OR  . DOPamine  2-20 mcg/kg/min Intravenous To OR  . epinephrine  0.5-20 mcg/min Intravenous To OR  . glipiZIDE  2.5 mg Oral BID AC  . hydrochlorothiazide  25 mg Oral Daily  . insulin aspart  0-9 Units Subcutaneous TID WC  . insulin (NOVOLIN-R) infusion   Intravenous To OR  . irbesartan  300 mg Oral Daily  . magnesium sulfate  40 mEq Other To OR  . metoprolol succinate  50 mg Oral Daily  . nitroGLYCERIN  2-200 mcg/min Intravenous To OR  . nitroglycerin-nicardipine-HEPARIN-sodium bicarbonate irrigation for artery spasm   Irrigation To OR  . pantoprazole  40 mg Oral Q1200  . phenylephrine (NEO-SYNEPHRINE) Adult infusion  30-200 mcg/min Intravenous To OR  . pneumococcal 23 valent vaccine  0.5 mL Intramuscular Once  . potassium chloride  80 mEq Other To OR  . pramipexole  0.75 mg Oral BID  . sodium chloride  3 mL Intravenous Q12H  . tranexamic acid  15 mg/kg Intravenous To OR  . tranexamic acid  2 mg/kg Intracatheter To OR  . tranexamic acid (CYKLOKAPRON) infusion ([facility])  1.5 mg/kg/hr Intravenous To OR  . vancomycin  1,500 mg Intravenous To OR      . sodium chloride Stopped (10/26/11 0800)  . heparin Stopped (10/26/11 0800)  . nitroGLYCERIN 3 mcg/min (10/24/11 0042)  . DISCONTD: heparin 1,900 Units/hr (10/25/11 1836)    OBJECTIVE: Physical Exam: Filed Vitals:   10/25/11 1945 10/25/11 2300 10/26/11 0350 10/26/11 0751  BP: 109/66 94/54 111/59 125/67  Pulse:      Temp: 98.7 F (37.1 C) 98.8 F (37.1 C) 97.6 F (36.4 C) 98.2 F (36.8 C)  TempSrc: Oral Oral Oral   Resp: 18 16 16    Height:      Weight:      SpO2: 95% 95%  95% 93%    Intake/Output Summary (Last 24 hours) at 10/26/11 0920 Last data filed at 10/26/11 0600  Gross per 24 hour  Intake 825.02 ml  Output    250 ml  Net 575.02 ml    Telemetry reveals sinus rhythm  GEN- The patient is well appearing, alert and oriented x 3 today.   Head- normocephalic, atraumatic Eyes-  Sclera clear, conjunctiva pink Ears- hearing intact Oropharynx- clear Neck- supple, no JVP Lymph- no cervical lymphadenopathy Lungs- Clear to ausculation bilaterally, normal work of breathing Heart- Regular rate and rhythm, no murmurs, rubs or gallops, PMI not laterally displaced GI- soft, NT, ND, + BS Extremities- no clubbing, cyanosis, or edema   LABS: Basic Metabolic Panel:  Basename 10/24/11 0405 10/23/11 2034  NA 135 136  K 3.5 3.5  CL 97 96  CO2 28 27  GLUCOSE 161* 113*  BUN 18 17  CREATININE 1.13 1.06  CALCIUM 9.1 9.8  MG -- 2.0  PHOS -- --   Liver Function Tests:  [facility] 10/23/11 2034  AST 19  ALT 22  ALKPHOS 78  BILITOT 0.9  PROT 7.7  ALBUMIN 4.2   No results found for this basename: LIPASE:2,AMYLASE:2 in the last 72 hours  CBC:  Basename 10/26/11 0600 10/25/11 0615  WBC 9.5 10.4  NEUTROABS -- --  HGB 14.8 15.1  HCT 42.5 43.2  MCV 85.5 85.5  PLT 213 220  Hemoglobin A1C:  Basename 10/23/11 2034  HGBA1C 7.8*   Fasting Lipid Panel:  Basename 10/24/11 1428  CHOL 139  HDL 47  LDLCALC 72  TRIG 102  CHOLHDL 3.0  LDLDIRECT --   Thyroid Function Tests:  Basename 10/23/11 2034  TSH 1.557  T4TOTAL --  T3FREE --  THYROIDAB --   EKG today is reviewed  ASSESSMENT AND PLAN:  Principal Problem:  *Coronary artery disease Active Problems:  Hypertension  Hyperlipidemia  DM (diabetes mellitus), type 2, uncontrolled with complications  GERD (gastroesophageal reflux disease)  1. Severe 3V CAD- keep in stepdown over the weekend on heparin gtt CABG planned for Monday  2. DM- appreciate medicine input  3. HTN- stable   Hillis Range, MD 10/26/2011 9:20 AM

## 2011-10-27 ENCOUNTER — Inpatient Hospital Stay (HOSPITAL_COMMUNITY): Payer: Medicare Other | Admitting: Anesthesiology

## 2011-10-27 ENCOUNTER — Encounter (HOSPITAL_COMMUNITY): Payer: Self-pay | Admitting: Anesthesiology

## 2011-10-27 ENCOUNTER — Encounter (HOSPITAL_COMMUNITY)
Admission: RE | Disposition: A | Discharge status: Home / Self Care | Payer: Self-pay | Source: Ambulatory Visit | Attending: Cardiothoracic Surgery

## 2011-10-27 ENCOUNTER — Inpatient Hospital Stay (HOSPITAL_COMMUNITY): Payer: Medicare Other

## 2011-10-27 DIAGNOSIS — I2510 Atherosclerotic heart disease of native coronary artery without angina pectoris: Secondary | ICD-10-CM

## 2011-10-27 HISTORY — PX: CORONARY ARTERY BYPASS GRAFT: SHX141

## 2011-10-27 LAB — POCT I-STAT, CHEM 8
BUN: 19 mg/dL (ref 6–23)
Calcium, Ion: 1.1 mmol/L — ABNORMAL LOW (ref 1.13–1.30)
Chloride: 104 meq/L (ref 96–112)
Creatinine, Ser: 1.1 mg/dL (ref 0.50–1.35)
Glucose, Bld: 101 mg/dL — ABNORMAL HIGH (ref 70–99)
HCT: 32 % — ABNORMAL LOW (ref 39.0–52.0)
Hemoglobin: 10.9 g/dL — ABNORMAL LOW (ref 13.0–17.0)
Potassium: 4.8 meq/L (ref 3.5–5.1)
Sodium: 139 meq/L (ref 135–145)
TCO2: 22 mmol/L (ref 0–100)

## 2011-10-27 LAB — POCT I-STAT 4, (NA,K, GLUC, HGB,HCT)
Glucose, Bld: 114 mg/dL — ABNORMAL HIGH (ref 70–99)
Glucose, Bld: 124 mg/dL — ABNORMAL HIGH (ref 70–99)
Glucose, Bld: 159 mg/dL — ABNORMAL HIGH (ref 70–99)
Glucose, Bld: 117 mg/dL — ABNORMAL HIGH (ref 70–99)
HCT: 33 % — ABNORMAL LOW (ref 39.0–52.0)
HCT: 36 % — ABNORMAL LOW (ref 39.0–52.0)
Hemoglobin: 10.9 g/dL — ABNORMAL LOW (ref 13.0–17.0)
Hemoglobin: 11.2 g/dL — ABNORMAL LOW (ref 13.0–17.0)
Hemoglobin: 12.2 g/dL — ABNORMAL LOW (ref 13.0–17.0)
Potassium: 3.9 mEq/L (ref 3.5–5.1)
Potassium: 3.8 mEq/L (ref 3.5–5.1)
Potassium: 4.7 mEq/L (ref 3.5–5.1)
Potassium: 3.7 meq/L (ref 3.5–5.1)
Sodium: 133 mEq/L — ABNORMAL LOW (ref 135–145)
Sodium: 134 mEq/L — ABNORMAL LOW (ref 135–145)
Sodium: 135 mEq/L (ref 135–145)
Sodium: 138 meq/L (ref 135–145)

## 2011-10-27 LAB — GLUCOSE, CAPILLARY
Glucose-Capillary: 114 mg/dL — ABNORMAL HIGH (ref 70–99)
Glucose-Capillary: 117 mg/dL — ABNORMAL HIGH (ref 70–99)
Glucose-Capillary: 119 mg/dL — ABNORMAL HIGH (ref 70–99)
Glucose-Capillary: 131 mg/dL — ABNORMAL HIGH (ref 70–99)
Glucose-Capillary: 145 mg/dL — ABNORMAL HIGH (ref 70–99)
Glucose-Capillary: 60 mg/dL — ABNORMAL LOW (ref 70–99)
Glucose-Capillary: 90 mg/dL (ref 70–99)

## 2011-10-27 LAB — POCT I-STAT 3, ART BLOOD GAS (G3+)
Acid-base deficit: 2 mmol/L (ref 0.0–2.0)
Acid-base deficit: 3 mmol/L — ABNORMAL HIGH (ref 0.0–2.0)
Bicarbonate: 26.1 mEq/L — ABNORMAL HIGH (ref 20.0–24.0)
Bicarbonate: 24.9 mEq/L — ABNORMAL HIGH (ref 20.0–24.0)
Bicarbonate: 24.4 mEq/L — ABNORMAL HIGH (ref 20.0–24.0)
O2 Saturation: 100 %
O2 Saturation: 100 %
O2 Saturation: 97 %
Patient temperature: 36.6
TCO2: 27 mmol/L (ref 0–100)
pH, Arterial: 7.345 — ABNORMAL LOW (ref 7.350–7.450)
pO2, Arterial: 88 mmHg (ref 80.0–100.0)

## 2011-10-27 LAB — CBC
HCT: 36.2 % — ABNORMAL LOW (ref 39.0–52.0)
HCT: 32.3 % — ABNORMAL LOW (ref 39.0–52.0)
Hemoglobin: 11.4 g/dL — ABNORMAL LOW (ref 13.0–17.0)
MCH: 29.7 pg (ref 26.0–34.0)
MCHC: 35.3 g/dL (ref 30.0–36.0)
MCV: 84 fL (ref 78.0–100.0)
MCV: 84.1 fL (ref 78.0–100.0)
Platelets: 140 10*3/uL — ABNORMAL LOW (ref 150–400)
Platelets: 163 K/uL (ref 150–400)
RBC: 4.31 MIL/uL (ref 4.22–5.81)
RBC: 3.84 MIL/uL — ABNORMAL LOW (ref 4.22–5.81)
RDW: 12.6 % (ref 11.5–15.5)
RDW: 12.7 % (ref 11.5–15.5)
WBC: 15.9 10*3/uL — ABNORMAL HIGH (ref 4.0–10.5)
WBC: 14.4 K/uL — ABNORMAL HIGH (ref 4.0–10.5)

## 2011-10-27 LAB — MAGNESIUM: Magnesium: 2.2 mg/dL (ref 1.5–2.5)

## 2011-10-27 LAB — CREATININE, SERUM
Creatinine, Ser: 1.02 mg/dL (ref 0.50–1.35)
GFR calc Af Amer: 86 mL/min — ABNORMAL LOW
GFR calc non Af Amer: 75 mL/min — ABNORMAL LOW

## 2011-10-27 LAB — HEMOGLOBIN AND HEMATOCRIT, BLOOD
HCT: 32.1 % — ABNORMAL LOW (ref 39.0–52.0)
Hemoglobin: 11.6 g/dL — ABNORMAL LOW (ref 13.0–17.0)

## 2011-10-27 LAB — PLATELET COUNT: Platelets: 177 10*3/uL (ref 150–400)

## 2011-10-27 LAB — APTT: aPTT: 31 s (ref 24–37)

## 2011-10-27 LAB — PROTIME-INR
INR: 1.38 (ref 0.00–1.49)
Prothrombin Time: 17.2 s — ABNORMAL HIGH (ref 11.6–15.2)

## 2011-10-27 LAB — SURGICAL PCR SCREEN
MRSA, PCR: NEGATIVE
Staphylococcus aureus: NEGATIVE

## 2011-10-27 LAB — POCT I-STAT GLUCOSE: Operator id: 286331

## 2011-10-27 SURGERY — CORONARY ARTERY BYPASS GRAFTING (CABG)
Anesthesia: General | Site: Chest | Wound class: Clean

## 2011-10-27 MED ORDER — ROCURONIUM BROMIDE 100 MG/10ML IV SOLN
INTRAVENOUS | Status: DC | PRN
  Administered 2011-10-27: 100 mg via INTRAVENOUS

## 2011-10-27 MED ORDER — HEPARIN SODIUM (PORCINE) 1000 UNIT/ML IJ SOLN
INTRAMUSCULAR | Status: AC
  Filled 2011-10-27: qty 1

## 2011-10-27 MED ORDER — SODIUM CHLORIDE 0.9 % IJ SOLN
OROMUCOSAL | Status: DC | PRN
  Administered 2011-10-27 (×3): via TOPICAL

## 2011-10-27 MED ORDER — LACTATED RINGERS IV SOLN
INTRAVENOUS | Status: DC | PRN
  Administered 2011-10-27 (×2): via INTRAVENOUS

## 2011-10-27 MED ORDER — LACTATED RINGERS IV SOLN
500.0000 mL | Freq: Once | INTRAVENOUS | Status: AC | PRN
  Administered 2011-10-27: 500 mL via INTRAVENOUS

## 2011-10-27 MED ORDER — TRANEXAMIC ACID 100 MG/ML IV SOLN
1.5000 mg/kg/h | INTRAVENOUS | Status: DC
  Filled 2011-10-27: qty 25

## 2011-10-27 MED ORDER — VECURONIUM BROMIDE 10 MG IV SOLR
INTRAVENOUS | Status: DC | PRN
  Administered 2011-10-27 (×6): 5 mg via INTRAVENOUS

## 2011-10-27 MED ORDER — FAMOTIDINE IN NACL 20-0.9 MG/50ML-% IV SOLN
20.0000 mg | Freq: Two times a day (BID) | INTRAVENOUS | Status: AC
  Administered 2011-10-27: 20 mg via INTRAVENOUS

## 2011-10-27 MED ORDER — SODIUM CHLORIDE 0.9 % IV SOLN
250.0000 mL | INTRAVENOUS | Status: DC
  Administered 2011-10-28: 250 mL via INTRAVENOUS

## 2011-10-27 MED ORDER — SODIUM CHLORIDE 0.9 % IJ SOLN: 3.0000 mL | INTRAMUSCULAR | Status: DC | PRN

## 2011-10-27 MED ORDER — PRAMIPEXOLE DIHYDROCHLORIDE 0.25 MG PO TABS
0.7500 mg | ORAL_TABLET | Freq: Two times a day (BID) | ORAL | Status: DC
  Administered 2011-10-28 – 2011-10-31 (×7): 0.75 mg via ORAL
  Filled 2011-10-27 (×8): qty 3

## 2011-10-27 MED ORDER — PROTAMINE SULFATE 10 MG/ML IV SOLN
INTRAVENOUS | Status: DC | PRN
  Administered 2011-10-27: 350 mg via INTRAVENOUS

## 2011-10-27 MED ORDER — PROPOFOL 10 MG/ML IV EMUL
INTRAVENOUS | Status: DC | PRN
  Administered 2011-10-27: 40 mg via INTRAVENOUS
  Administered 2011-10-27: 160 mg via INTRAVENOUS

## 2011-10-27 MED ORDER — LIDOCAINE HCL (CARDIAC) 20 MG/ML IV SOLN
INTRAVENOUS | Status: AC
  Filled 2011-10-27: qty 5

## 2011-10-27 MED ORDER — MIDAZOLAM HCL 2 MG/2ML IJ SOLN: 2.0000 mg | INTRAMUSCULAR | Status: DC | PRN

## 2011-10-27 MED ORDER — MIDAZOLAM HCL 5 MG/5ML IJ SOLN
INTRAMUSCULAR | Status: DC | PRN
  Administered 2011-10-27 (×2): 3 mg via INTRAVENOUS
  Administered 2011-10-27 (×2): 2 mg via INTRAVENOUS

## 2011-10-27 MED ORDER — CHLORHEXIDINE GLUCONATE 4 % EX LIQD
60.0000 mL | Freq: Once | CUTANEOUS | Status: AC
  Administered 2011-10-27: 4 via TOPICAL
  Filled 2011-10-27: qty 60

## 2011-10-27 MED ORDER — LACTATED RINGERS IV SOLN
INTRAVENOUS | Status: DC
  Administered 2011-10-27: 20 mL/h via INTRAVENOUS

## 2011-10-27 MED ORDER — METOPROLOL TARTRATE 12.5 MG HALF TABLET
12.5000 mg | ORAL_TABLET | Freq: Two times a day (BID) | ORAL | Status: DC
  Filled 2011-10-27 (×3): qty 1

## 2011-10-27 MED ORDER — ONDANSETRON HCL 4 MG/2ML IJ SOLN
4.0000 mg | Freq: Four times a day (QID) | INTRAMUSCULAR | Status: DC | PRN
  Administered 2011-10-28 – 2011-11-02 (×9): 4 mg via INTRAVENOUS
  Filled 2011-10-27 (×9): qty 2

## 2011-10-27 MED ORDER — NITROGLYCERIN IN D5W 200-5 MCG/ML-% IV SOLN
0.0000 ug/min | INTRAVENOUS | Status: DC
  Administered 2011-10-27: 5 ug/min via INTRAVENOUS

## 2011-10-27 MED ORDER — MORPHINE SULFATE 2 MG/ML IJ SOLN
1.0000 mg | INTRAMUSCULAR | Status: AC | PRN
  Administered 2011-10-27 (×4): 2 mg via INTRAVENOUS

## 2011-10-27 MED ORDER — DEXTROSE 5 % IV SOLN
1.5000 g | Freq: Two times a day (BID) | INTRAVENOUS | Status: AC
  Administered 2011-10-27 – 2011-10-29 (×4): 1.5 g via INTRAVENOUS
  Filled 2011-10-27 (×4): qty 1.5

## 2011-10-27 MED ORDER — ACETAMINOPHEN 650 MG RE SUPP
650.0000 mg | RECTAL | Status: AC
  Administered 2011-10-27: 650 mg via RECTAL

## 2011-10-27 MED ORDER — PANTOPRAZOLE SODIUM 40 MG PO TBEC
40.0000 mg | DELAYED_RELEASE_TABLET | Freq: Every day | ORAL | Status: DC
  Administered 2011-10-29 – 2011-11-03 (×6): 40 mg via ORAL
  Filled 2011-10-27 (×6): qty 1

## 2011-10-27 MED ORDER — 0.9 % SODIUM CHLORIDE (POUR BTL) OPTIME
TOPICAL | Status: DC | PRN
  Administered 2011-10-27: 6000 mL

## 2011-10-27 MED ORDER — POTASSIUM CHLORIDE 10 MEQ/50ML IV SOLN
10.0000 meq | INTRAVENOUS | Status: AC
  Administered 2011-10-27 (×3): 10 meq via INTRAVENOUS

## 2011-10-27 MED ORDER — ALBUMIN HUMAN 5 % IV SOLN
250.0000 mL | INTRAVENOUS | Status: AC | PRN
  Administered 2011-10-27 – 2011-10-28 (×4): 250 mL via INTRAVENOUS
  Filled 2011-10-27: qty 500

## 2011-10-27 MED ORDER — MORPHINE SULFATE 2 MG/ML IJ SOLN
2.0000 mg | INTRAMUSCULAR | Status: DC | PRN
  Administered 2011-10-28 (×5): 4 mg via INTRAVENOUS
  Administered 2011-10-28 (×2): 2 mg via INTRAVENOUS
  Administered 2011-10-28: 4 mg via INTRAVENOUS
  Administered 2011-10-29 – 2011-10-30 (×3): 2 mg via INTRAVENOUS
  Filled 2011-10-27: qty 2
  Filled 2011-10-27 (×4): qty 1
  Filled 2011-10-27 (×2): qty 2
  Filled 2011-10-27: qty 1
  Filled 2011-10-27 (×3): qty 2
  Filled 2011-10-27: qty 1
  Filled 2011-10-27: qty 2
  Filled 2011-10-27: qty 1

## 2011-10-27 MED ORDER — SODIUM CHLORIDE 0.45 % IV SOLN
INTRAVENOUS | Status: DC
  Administered 2011-10-27: 20 mL/h via INTRAVENOUS

## 2011-10-27 MED ORDER — SODIUM CHLORIDE 0.9 % IV SOLN
INTRAVENOUS | Status: DC
  Administered 2011-10-27: 20 mL/h via INTRAVENOUS

## 2011-10-27 MED ORDER — BISACODYL 5 MG PO TBEC
10.0000 mg | DELAYED_RELEASE_TABLET | Freq: Every day | ORAL | Status: DC
  Administered 2011-10-28 – 2011-11-03 (×5): 10 mg via ORAL
  Filled 2011-10-27 (×5): qty 2

## 2011-10-27 MED ORDER — PHENYLEPHRINE HCL 10 MG/ML IJ SOLN
0.0000 ug/min | INTRAVENOUS | Status: DC
  Administered 2011-10-28: 50 ug/min via INTRAVENOUS
  Administered 2011-10-28: 10 ug/min via INTRAVENOUS
  Administered 2011-10-28: 80 ug/min via INTRAVENOUS
  Administered 2011-10-29: 15 ug/min via INTRAVENOUS
  Filled 2011-10-27 (×9): qty 2

## 2011-10-27 MED ORDER — SODIUM BICARBONATE 8.4 % IV SOLN
50.0000 meq | Freq: Once | INTRAVENOUS | Status: AC
  Administered 2011-10-27: 50 meq via INTRAVENOUS
  Filled 2011-10-27: qty 50

## 2011-10-27 MED ORDER — ACETAMINOPHEN 500 MG PO TABS
1000.0000 mg | ORAL_TABLET | Freq: Four times a day (QID) | ORAL | Status: AC
  Administered 2011-10-28 – 2011-11-01 (×15): 1000 mg via ORAL
  Filled 2011-10-27 (×17): qty 2

## 2011-10-27 MED ORDER — DEXMEDETOMIDINE HCL IN NACL 200 MCG/50ML IV SOLN
0.1000 ug/kg/h | INTRAVENOUS | Status: DC
  Administered 2011-10-27: 0.7 ug/kg/h via INTRAVENOUS
  Filled 2011-10-27: qty 50
  Filled 2011-10-27: qty 100

## 2011-10-27 MED ORDER — INSULIN REGULAR BOLUS VIA INFUSION
0.0000 [IU] | Freq: Three times a day (TID) | INTRAVENOUS | Status: DC
  Filled 2011-10-27: qty 10

## 2011-10-27 MED ORDER — MAGNESIUM SULFATE 40 MG/ML IJ SOLN
4.0000 g | Freq: Once | INTRAMUSCULAR | Status: AC
  Administered 2011-10-27: 4 g via INTRAVENOUS
  Filled 2011-10-27: qty 100

## 2011-10-27 MED ORDER — OXYCODONE HCL 5 MG PO TABS
5.0000 mg | ORAL_TABLET | ORAL | Status: DC | PRN
  Administered 2011-10-28 – 2011-10-30 (×6): 10 mg via ORAL
  Administered 2011-10-30: 5 mg via ORAL
  Administered 2011-10-30: 10 mg via ORAL
  Administered 2011-11-01: 5 mg via ORAL
  Administered 2011-11-01: 10 mg via ORAL
  Administered 2011-11-01: 5 mg via ORAL
  Filled 2011-10-27 (×2): qty 2
  Filled 2011-10-27: qty 10
  Filled 2011-10-27 (×8): qty 2

## 2011-10-27 MED ORDER — HEMOSTATIC AGENTS (NO CHARGE) OPTIME
TOPICAL | Status: DC | PRN
  Administered 2011-10-27: 1 via TOPICAL

## 2011-10-27 MED ORDER — ASPIRIN EC 325 MG PO TBEC
325.0000 mg | DELAYED_RELEASE_TABLET | Freq: Every day | ORAL | Status: DC
  Administered 2011-10-28 – 2011-11-03 (×7): 325 mg via ORAL
  Filled 2011-10-27 (×7): qty 1

## 2011-10-27 MED ORDER — ACETAMINOPHEN 160 MG/5ML PO SOLN
650.0000 mg | ORAL | Status: AC
  Filled 2011-10-27: qty 40.6

## 2011-10-27 MED ORDER — METOPROLOL TARTRATE 1 MG/ML IV SOLN: 2.5000 mg | INTRAVENOUS | Status: DC | PRN

## 2011-10-27 MED ORDER — FENTANYL CITRATE 0.05 MG/ML IJ SOLN
INTRAMUSCULAR | Status: DC | PRN
  Administered 2011-10-27: 250 ug via INTRAVENOUS
  Administered 2011-10-27: 300 ug via INTRAVENOUS
  Administered 2011-10-27: 250 ug via INTRAVENOUS
  Administered 2011-10-27: 150 ug via INTRAVENOUS
  Administered 2011-10-27: 100 ug via INTRAVENOUS
  Administered 2011-10-27: 200 ug via INTRAVENOUS

## 2011-10-27 MED ORDER — DOCUSATE SODIUM 100 MG PO CAPS
200.0000 mg | ORAL_CAPSULE | Freq: Every day | ORAL | Status: DC
  Administered 2011-10-28 – 2011-11-02 (×6): 200 mg via ORAL
  Filled 2011-10-27 (×7): qty 2

## 2011-10-27 MED ORDER — SODIUM CHLORIDE 0.9 % IJ SOLN
3.0000 mL | Freq: Two times a day (BID) | INTRAMUSCULAR | Status: DC
  Administered 2011-10-28 – 2011-10-31 (×3): 3 mL via INTRAVENOUS

## 2011-10-27 MED ORDER — SODIUM BICARBONATE 8.4 % IV SOLN
INTRAVENOUS | Status: AC
  Filled 2011-10-27: qty 50

## 2011-10-27 MED ORDER — LACTATED RINGERS IV SOLN
INTRAVENOUS | Status: DC | PRN
  Administered 2011-10-27: 07:00:00 via INTRAVENOUS

## 2011-10-27 MED ORDER — LACTATED RINGERS IV SOLN
INTRAVENOUS | Status: DC | PRN
  Administered 2011-10-27 (×2): via INTRAVENOUS

## 2011-10-27 MED ORDER — HEPARIN SODIUM (PORCINE) 1000 UNIT/ML IJ SOLN
INTRAMUSCULAR | Status: DC | PRN
  Administered 2011-10-27: 5000 [IU] via INTRAVENOUS
  Administered 2011-10-27: 34000 [IU] via INTRAVENOUS

## 2011-10-27 MED ORDER — INSULIN REGULAR HUMAN 100 UNIT/ML IJ SOLN
INTRAMUSCULAR | Status: DC
  Filled 2011-10-27 (×2): qty 1

## 2011-10-27 MED ORDER — ALBUMIN HUMAN 5 % IV SOLN
INTRAVENOUS | Status: AC
  Filled 2011-10-27: qty 250

## 2011-10-27 MED ORDER — DOPAMINE-DEXTROSE 3.2-5 MG/ML-% IV SOLN
0.0000 ug/kg/min | INTRAVENOUS | Status: DC
  Administered 2011-10-29: 3 ug/kg/min via INTRAVENOUS
  Filled 2011-10-27: qty 250

## 2011-10-27 MED ORDER — ASPIRIN 81 MG PO CHEW: 324.0000 mg | CHEWABLE_TABLET | Freq: Every day | ORAL | Status: DC

## 2011-10-27 MED ORDER — VANCOMYCIN HCL IN DEXTROSE 1-5 GM/200ML-% IV SOLN
1000.0000 mg | INTRAVENOUS | Status: AC
  Administered 2011-10-27 – 2011-10-28 (×2): 1000 mg via INTRAVENOUS
  Filled 2011-10-27 (×2): qty 200

## 2011-10-27 MED ORDER — ACETAMINOPHEN 160 MG/5ML PO SOLN
975.0000 mg | Freq: Four times a day (QID) | ORAL | Status: DC
  Administered 2011-10-27: 975 mg

## 2011-10-27 MED ORDER — BISACODYL 10 MG RE SUPP
10.0000 mg | Freq: Every day | RECTAL | Status: DC
  Filled 2011-10-27: qty 1

## 2011-10-27 MED ORDER — ALBUMIN HUMAN 5 % IV SOLN
INTRAVENOUS | Status: DC | PRN
  Administered 2011-10-27 (×2): via INTRAVENOUS

## 2011-10-27 MED ORDER — METOPROLOL TARTRATE 25 MG/10 ML ORAL SUSPENSION
12.5000 mg | Freq: Two times a day (BID) | ORAL | Status: DC
  Filled 2011-10-27 (×3): qty 5

## 2011-10-27 MED FILL — Magnesium Sulfate Inj 50%: INTRAMUSCULAR | Qty: 2 | Status: AC

## 2011-10-27 MED FILL — Potassium Chloride Inj 2 mEq/ML: INTRAVENOUS | Qty: 40 | Status: AC

## 2011-10-27 SURGICAL SUPPLY — 110 items
ADAPTER CARDIO PERF ANTE/RETRO (ADAPTER) ×3 IMPLANT
ATTRACTOMAT 16X20 MAGNETIC DRP (DRAPES) ×3 IMPLANT
BAG DECANTER FOR FLEXI CONT (MISCELLANEOUS) ×3 IMPLANT
BANDAGE ELASTIC 4 VELCRO ST LF (GAUZE/BANDAGES/DRESSINGS) ×3 IMPLANT
BANDAGE ELASTIC 6 VELCRO ST LF (GAUZE/BANDAGES/DRESSINGS) ×3 IMPLANT
BANDAGE GAUZE ELAST BULKY 4 IN (GAUZE/BANDAGES/DRESSINGS) ×3 IMPLANT
BASKET HEART  (ORDER IN 25'S) (MISCELLANEOUS) ×1
BASKET HEART (ORDER IN 25'S) (MISCELLANEOUS) ×1
BASKET HEART (ORDER IN 25S) (MISCELLANEOUS) ×1 IMPLANT
BLADE STERNUM SYSTEM 6 (BLADE) ×3 IMPLANT
BLADE SURG 12 STRL SS (BLADE) ×3 IMPLANT
BLADE SURG ROTATE 9660 (MISCELLANEOUS) IMPLANT
CANISTER SUCTION 2500CC (MISCELLANEOUS) ×3 IMPLANT
CANNULA AORTIC HI-FLOW 6.5M20F (CANNULA) ×3 IMPLANT
CANNULA ARTERIAL NVNT 3/8 22FR (MISCELLANEOUS) ×3 IMPLANT
CANNULA GUNDRY RCSP 15FR (MISCELLANEOUS) ×3 IMPLANT
CANNULA VENOUS MAL SGL STG 40 (MISCELLANEOUS) IMPLANT
CANNULAE VENOUS MAL SGL STG 40 (MISCELLANEOUS)
CATH CPB KIT VANTRIGT (MISCELLANEOUS) ×3 IMPLANT
CATH ROBINSON RED A/P 18FR (CATHETERS) ×9 IMPLANT
CATH THORACIC 28FR (CATHETERS) IMPLANT
CATH THORACIC 28FR RT ANG (CATHETERS) IMPLANT
CATH THORACIC 36FR (CATHETERS) IMPLANT
CATH THORACIC 36FR RT ANG (CATHETERS) ×6 IMPLANT
CLIP FOGARTY SPRING 6M (CLIP) ×3 IMPLANT
CLIP RETRACTION 3.0MM CORONARY (MISCELLANEOUS) ×3 IMPLANT
CLIP TI WIDE RED SMALL 24 (CLIP) ×6 IMPLANT
CLOTH BEACON ORANGE TIMEOUT ST (SAFETY) ×3 IMPLANT
COVER SURGICAL LIGHT HANDLE (MISCELLANEOUS) ×3 IMPLANT
CRADLE DONUT ADULT HEAD (MISCELLANEOUS) ×3 IMPLANT
DERMABOND ADVANCED (GAUZE/BANDAGES/DRESSINGS) ×4
DERMABOND ADVANCED .7 DNX12 (GAUZE/BANDAGES/DRESSINGS) ×2 IMPLANT
DRAIN CHANNEL 32F RND 10.7 FF (WOUND CARE) ×3 IMPLANT
DRAPE CARDIOVASCULAR INCISE (DRAPES) ×2
DRAPE SLUSH MACHINE 52X66 (DRAPES) IMPLANT
DRAPE SLUSH/WARMER DISC (DRAPES) IMPLANT
DRAPE SRG 135X102X78XABS (DRAPES) ×1 IMPLANT
DRSG COVADERM 4X14 (GAUZE/BANDAGES/DRESSINGS) ×3 IMPLANT
ELECT BLADE 4.0 EZ CLEAN MEGAD (MISCELLANEOUS) ×3
ELECT BLADE 6.5 EXT (BLADE) ×3 IMPLANT
ELECT CAUTERY BLADE 6.4 (BLADE) ×3 IMPLANT
ELECT REM PT RETURN 9FT ADLT (ELECTROSURGICAL) ×6
ELECTRODE BLDE 4.0 EZ CLN MEGD (MISCELLANEOUS) ×1 IMPLANT
ELECTRODE REM PT RTRN 9FT ADLT (ELECTROSURGICAL) ×2 IMPLANT
GLOVE BIO SURGEON STRL SZ 6.5 (GLOVE) ×10 IMPLANT
GLOVE BIO SURGEON STRL SZ7.5 (GLOVE) ×21 IMPLANT
GLOVE BIO SURGEONS STRL SZ 6.5 (GLOVE) ×5
GLOVE BIOGEL PI IND STRL 6 (GLOVE) ×4 IMPLANT
GLOVE BIOGEL PI IND STRL 6.5 (GLOVE) ×2 IMPLANT
GLOVE BIOGEL PI INDICATOR 6 (GLOVE) ×8
GLOVE BIOGEL PI INDICATOR 6.5 (GLOVE) ×4
GOWN STRL NON-REIN LRG LVL3 (GOWN DISPOSABLE) ×12 IMPLANT
HEMOSTAT POWDER SURGIFOAM 1G (HEMOSTASIS) ×9 IMPLANT
HEMOSTAT SURGICEL 2X14 (HEMOSTASIS) ×3 IMPLANT
INSERT FOGARTY XLG (MISCELLANEOUS) IMPLANT
KIT BASIN OR (CUSTOM PROCEDURE TRAY) ×3 IMPLANT
KIT ROOM TURNOVER OR (KITS) ×3 IMPLANT
KIT SUCTION CATH 14FR (SUCTIONS) ×3 IMPLANT
KIT VASOVIEW W/TROCAR VH 2000 (KITS) ×3 IMPLANT
LEAD PACING MYOCARDI (MISCELLANEOUS) ×3 IMPLANT
MARKER GRAFT CORONARY BYPASS (MISCELLANEOUS) ×9 IMPLANT
NS IRRIG 1000ML POUR BTL (IV SOLUTION) ×15 IMPLANT
PACK OPEN HEART (CUSTOM PROCEDURE TRAY) ×3 IMPLANT
PAD ARMBOARD 7.5X6 YLW CONV (MISCELLANEOUS) ×6 IMPLANT
PENCIL BUTTON HOLSTER BLD 10FT (ELECTRODE) ×3 IMPLANT
PUNCH AORTIC ROTATE 4.0MM (MISCELLANEOUS) ×3 IMPLANT
PUNCH AORTIC ROTATE 4.5MM 8IN (MISCELLANEOUS) IMPLANT
PUNCH AORTIC ROTATE 5MM 8IN (MISCELLANEOUS) IMPLANT
SET CARDIOPLEGIA MPS 5001102 (MISCELLANEOUS) ×3 IMPLANT
SPONGE GAUZE 4X4 12PLY (GAUZE/BANDAGES/DRESSINGS) ×3 IMPLANT
SPONGE LAP 18X18 X RAY DECT (DISPOSABLE) ×3 IMPLANT
SPONGE LAP 4X18 X RAY DECT (DISPOSABLE) ×3 IMPLANT
SUT BONE WAX W31G (SUTURE) ×3 IMPLANT
SUT MNCRL AB 4-0 PS2 18 (SUTURE) ×3 IMPLANT
SUT PROLENE 3 0 SH DA (SUTURE) IMPLANT
SUT PROLENE 3 0 SH1 36 (SUTURE) IMPLANT
SUT PROLENE 4 0 RB 1 (SUTURE) ×2
SUT PROLENE 4 0 SH DA (SUTURE) ×3 IMPLANT
SUT PROLENE 4-0 RB1 .5 CRCL 36 (SUTURE) ×1 IMPLANT
SUT PROLENE 5 0 C 1 36 (SUTURE) ×3 IMPLANT
SUT PROLENE 6 0 C 1 30 (SUTURE) IMPLANT
SUT PROLENE 7 0 DA (SUTURE) IMPLANT
SUT PROLENE 7.0 RB 3 (SUTURE) ×12 IMPLANT
SUT PROLENE 8 0 BV175 6 (SUTURE) IMPLANT
SUT PROLENE BLUE 7 0 (SUTURE) ×9 IMPLANT
SUT SILK  1 MH (SUTURE)
SUT SILK 1 MH (SUTURE) IMPLANT
SUT SILK 2 0 SH CR/8 (SUTURE) ×3 IMPLANT
SUT SILK 3 0 SH CR/8 (SUTURE) IMPLANT
SUT STEEL 6MS V (SUTURE) ×6 IMPLANT
SUT STEEL STERNAL CCS#1 18IN (SUTURE) ×3 IMPLANT
SUT STEEL SZ 6 DBL 3X14 BALL (SUTURE) ×3 IMPLANT
SUT VIC AB 1 CTX 36 (SUTURE) ×6
SUT VIC AB 1 CTX36XBRD ANBCTR (SUTURE) ×3 IMPLANT
SUT VIC AB 2-0 CT1 27 (SUTURE) ×2
SUT VIC AB 2-0 CT1 TAPERPNT 27 (SUTURE) ×1 IMPLANT
SUT VIC AB 2-0 CTX 27 (SUTURE) ×3 IMPLANT
SUT VIC AB 3-0 SH 27 (SUTURE)
SUT VIC AB 3-0 SH 27X BRD (SUTURE) IMPLANT
SUT VIC AB 3-0 X1 27 (SUTURE) IMPLANT
SUT VICRYL 4-0 PS2 18IN ABS (SUTURE) IMPLANT
SUTURE E-PAK OPEN HEART (SUTURE) ×3 IMPLANT
SYSTEM SAHARA CHEST DRAIN ATS (WOUND CARE) ×3 IMPLANT
TAPE PAPER 3X10 WHT MICROPORE (GAUZE/BANDAGES/DRESSINGS) ×3 IMPLANT
TOWEL OR 17X24 6PK STRL BLUE (TOWEL DISPOSABLE) ×6 IMPLANT
TOWEL OR 17X26 10 PK STRL BLUE (TOWEL DISPOSABLE) ×6 IMPLANT
TRAY FOLEY IC TEMP SENS 14FR (CATHETERS) ×3 IMPLANT
TUBING INSUFFLATION 10FT LAP (TUBING) ×3 IMPLANT
UNDERPAD 30X30 INCONTINENT (UNDERPADS AND DIAPERS) ×3 IMPLANT
WATER STERILE IRR 1000ML POUR (IV SOLUTION) ×6 IMPLANT

## 2011-10-27 NOTE — Transfer of Care (Signed)
Immediate Anesthesia Transfer of Care Note  Patient: James [name] [facility]  Procedure(s) Performed: Procedure(s) (LRB) with comments: CORONARY ARTERY BYPASS GRAFTING (CABG) (N/A) - Coronary Artery Bypass Grafting times four using left internal mammary artery and right greater saphenous vein endoscopically harvested  Patient Location: SICU  Anesthesia Type: General  Level of Consciousness: sedated  Airway & Oxygen Therapy: Patient remains intubated per anesthesia plan  Post-op Assessment: Report given to PACU RN and Post -op Vital signs reviewed and stable  Post vital signs: Reviewed and stable  Complications: No apparent anesthesia complications

## 2011-10-27 NOTE — Progress Notes (Signed)
Pt clipped and prepped chin to toes; also had CHG 6 wipes completed;

## 2011-10-27 NOTE — Progress Notes (Signed)
Patient ID: James [name], male   DOB: March 12, 1945, 66 y.o.   MRN: [id number]  TCTS evening rounds:  Hemodynamically stable on dop 3.  CI= 2.5  Chest tube output initially 150/hr, dark and clotting, but has now decreased. Coagulation profile was ok.  Urine output ok CBC    Component Value Date/Time   WBC 15.9* 10/27/2011 1430   RBC 4.31 10/27/2011 1430   HGB 12.2* 10/27/2011 1431   HCT 36.0* 10/27/2011 1431   PLT 140* 10/27/2011 1430   MCV 84.0 10/27/2011 1430   MCH 29.2 10/27/2011 1430   MCHC 34.8 10/27/2011 1430   RDW 12.6 10/27/2011 1430    BMET    Component Value Date/Time   NA 138 10/27/2011 1431   K 3.7 10/27/2011 1431   CL 99 10/26/2011 2229   CO2 21 10/26/2011 2229   GLUCOSE 117* 10/27/2011 1431   BUN 21 10/26/2011 2229   CREATININE 1.20 10/26/2011 2229   CALCIUM 9.5 10/26/2011 2229   GFRNONAA 61* 10/26/2011 2229   GFRAA 71* 10/26/2011 2229     Wean vent

## 2011-10-27 NOTE — Brief Op Note (Signed)
10/23/2011 - 10/27/2011  12:04 PM  PATIENT:  James [name]  66 y.o. male  PRE-OPERATIVE DIAGNOSIS:  Coronary Artery Disease  POST-OPERATIVE DIAGNOSIS:  Coronary Artery Disease  PROCEDURE:   CORONARY ARTERY BYPASS GRAFTING (CABG) x 4 (LIMA to LAD, SVG to Diagonal, SVG to OM, and SVG to PDA) with EVH from the right thigh and partial calf  SURGEON:  Surgeon(s) and Role:    * Kerin Perna, MD - Primary  PHYSICIAN ASSISTANT: Doree Fudge PA-C   ANESTHESIA:   general  EBL:  Total I/O In: -  Out: 1240 [Urine:1240]    DRAINS:  Chest Tube(s) in the Mediastinal and Pleural spaces    COUNTS CORRECT:  YES  DICTATION: .Dragon Dictation  PLAN OF CARE: Admit to inpatient   PATIENT DISPOSITION:  ICU - intubated and hemodynamically stable.   Delay start of Pharmacological VTE agent (>24hrs) due to surgical   blood loss or risk of bleeding: yes  PRE OP WEIGHT: 117 kg

## 2011-10-27 NOTE — Preoperative (Signed)
Beta Blockers   Reason not to administer Beta Blockers:Not Applicable. Lopressor 12.5 mg @ 0542 10/27/11

## 2011-10-27 NOTE — Progress Notes (Signed)
ANTICOAGULATION CONSULT NOTE   Pharmacy Consult for Heparin Indication: Post cath awaiting CABG on Monday, 9/9  Allergies  Allergen Reactions  . Lisinopril     Patient Measurements: Height: 5\' 9"  (175.3 cm) Weight: 258 lb 2.5 oz (117.1 kg) IBW/kg (Calculated) : 70.7  Heparin Dosing Weight: 97 kg  Vital Signs: Temp: 97.6 F (36.4 C) (09/08 2000) Temp src: Oral (09/08 2000) BP: 125/55 mmHg (09/08 2000)  Labs:  Basename 10/27/11 0008 10/26/11 2229 10/26/11 1629 10/26/11 0600 10/25/11 0615 10/24/11 0405  HGB -- 15.6 -- 14.8 -- --  HCT -- 44.3 -- 42.5 43.2 --  PLT -- 210 -- 213 220 --  APTT -- -- -- 156* -- --  LABPROT -- -- -- 14.2 -- --  INR -- -- -- 1.08 -- --  HEPARINUNFRC 0.35 <0.10* 0.38 -- -- --  CREATININE -- 1.20 -- -- -- 1.13  CKTOTAL -- -- -- -- -- --  CKMB -- -- -- -- -- --  TROPONINI -- -- -- -- -- --    Estimated Creatinine Clearance: 76.5 ml/min (by C-G formula based on Cr of 1.2).  Assessment: 66 y.o. Male with CAD, awaiting CABG, for Heparin.  Prior level drawn was low, but redraw reveals level with range.  Goal of Therapy:  Heparin level 0.3-0.7 units/ml Monitor platelets by anticoagulation protocol: Yes   Plan:  Continue Heparin at current rate F/U after surgery  Geannie Risen, PharmD, BCPS  10/27/2011 1:12 AM

## 2011-10-27 NOTE — Anesthesia Preprocedure Evaluation (Addendum)
Anesthesia Evaluation  Patient identified by MRN, date of birth, ID band Patient awake    Reviewed: Allergy & Precautions, H&P , NPO status , Patient's Chart, lab work & pertinent test results, reviewed documented beta blocker date and time   Airway Mallampati: II  Neck ROM: full    Dental  (+) Teeth Intact and Dental Advisory Given   Pulmonary          Cardiovascular hypertension, Pt. on medications and Pt. on home beta blockers + CAD     Neuro/Psych    GI/Hepatic GERD-  Medicated,  Endo/Other  diabetesMorbid obesity  Renal/GU      Musculoskeletal   Abdominal (+) + obese,   Peds  Hematology   Anesthesia Other Findings   Reproductive/Obstetrics                          Anesthesia Physical Anesthesia Plan  ASA: III  Anesthesia Plan: General   Post-op Pain Management:    Induction: Intravenous  Airway Management Planned: Oral ETT  Additional Equipment: Arterial line, CVP, PA Cath, TEE and Ultrasound Guidance Line Placement  Intra-op Plan: Delibrate Circulatory arrest per surgeon request  Post-operative Plan: Post-operative intubation/ventilation  Informed Consent: I have reviewed the patients History and Physical, chart, labs and discussed the procedure including the risks, benefits and alternatives for the proposed anesthesia with the patient or authorized representative who has indicated his/her understanding and acceptance.   Dental advisory given  Plan Discussed with: CRNA and Surgeon  Anesthesia Plan Comments:        Anesthesia Quick Evaluation

## 2011-10-27 NOTE — Progress Notes (Signed)
  Echocardiogram Echocardiogram Transesophageal has been performed.  Zaven Klemens 10/27/2011, 8:40 AM

## 2011-10-27 NOTE — Anesthesia Procedure Notes (Addendum)
  Narrative:     10/27/11 RIJ PA catheter:   Routine monitors. Timeout, sterile prep, drape, FBP R neck.  Trendelenburg position.  1% Lido local, finder and trocar RIJ 1st pass with US guidance.  Cordis placed over J wire. PA catheter in easily.  Sterile dressing applied.  Patient tolerated well, VSS.  Sandford Craze, MD

## 2011-10-27 NOTE — Procedures (Signed)
Extubation Procedure Note  Patient Details:   Name: James [name] DOB: 1945/05/30 MRN: [id number]   Airway Documentation:  Airway 8 mm (Active)  Secured at (cm) 23 cm 10/27/2011  8:59 PM  Measured From Lips 10/27/2011  8:59 PM  Secured Location Right 10/27/2011  8:59 PM  Secured By Pink Tape 10/27/2011  8:59 PM  Cuff Pressure (cm H2O) 28 cm H2O 10/27/2011  2:00 PM  Site Condition Dry 10/27/2011  8:59 PM    Evaluation  O2 sats: stable throughout Complications: No apparent complications Patient did tolerate procedure well. Bilateral Breath Sounds: Clear;Diminished 02sat 98, HR 92, RR 25, BP 107/50 NIF -30 VC 2 liters   Yes  Ave Filter 10/27/2011, 11:02 PM

## 2011-10-28 ENCOUNTER — Inpatient Hospital Stay (HOSPITAL_COMMUNITY): Payer: Medicare Other

## 2011-10-28 ENCOUNTER — Encounter (HOSPITAL_COMMUNITY): Payer: Self-pay | Admitting: Cardiothoracic Surgery

## 2011-10-28 LAB — GLUCOSE, CAPILLARY
Glucose-Capillary: 107 mg/dL — ABNORMAL HIGH (ref 70–99)
Glucose-Capillary: 96 mg/dL (ref 70–99)
Glucose-Capillary: 91 mg/dL (ref 70–99)
Glucose-Capillary: 108 mg/dL — ABNORMAL HIGH (ref 70–99)
Glucose-Capillary: 148 mg/dL — ABNORMAL HIGH (ref 70–99)
Glucose-Capillary: 139 mg/dL — ABNORMAL HIGH (ref 70–99)

## 2011-10-28 LAB — BASIC METABOLIC PANEL
BUN: 23 mg/dL (ref 6–23)
Calcium: 7.9 mg/dL — ABNORMAL LOW (ref 8.4–10.5)
Creatinine, Ser: 1.46 mg/dL — ABNORMAL HIGH (ref 0.50–1.35)
GFR calc Af Amer: 56 mL/min — ABNORMAL LOW (ref >90)
GFR calc non Af Amer: 48 mL/min — ABNORMAL LOW (ref >90)

## 2011-10-28 LAB — CBC
HCT: 30.8 % — ABNORMAL LOW (ref 39.0–52.0)
MCH: 29.2 pg (ref 26.0–34.0)
MCH: 29.6 pg (ref 26.0–34.0)
MCHC: 34.4 g/dL (ref 30.0–36.0)
MCHC: 34.4 g/dL (ref 30.0–36.0)
MCV: 84.8 fL (ref 78.0–100.0)
MCV: 85.9 fL (ref 78.0–100.0)
Platelets: 146 10*3/uL — ABNORMAL LOW (ref 150–400)
Platelets: 136 10*3/uL — ABNORMAL LOW (ref 150–400)
RDW: 13 % (ref 11.5–15.5)
RDW: 13.4 % (ref 11.5–15.5)
WBC: 17.3 10*3/uL — ABNORMAL HIGH (ref 4.0–10.5)

## 2011-10-28 LAB — POCT I-STAT, CHEM 8
Chloride: 100 mEq/L (ref 96–112)
Creatinine, Ser: 2.1 mg/dL — ABNORMAL HIGH (ref 0.50–1.35)
Hemoglobin: 10.9 g/dL — ABNORMAL LOW (ref 13.0–17.0)
Potassium: 4.5 mEq/L (ref 3.5–5.1)
Sodium: 136 mEq/L (ref 135–145)

## 2011-10-28 LAB — MAGNESIUM: Magnesium: 2.7 mg/dL — ABNORMAL HIGH (ref 1.5–2.5)

## 2011-10-28 LAB — CREATININE, SERUM: Creatinine, Ser: 2.03 mg/dL — ABNORMAL HIGH (ref 0.50–1.35)

## 2011-10-28 MED ORDER — METOPROLOL TARTRATE 12.5 MG HALF TABLET
12.5000 mg | ORAL_TABLET | Freq: Two times a day (BID) | ORAL | Status: DC
  Administered 2011-10-29 – 2011-10-30 (×3): 12.5 mg via ORAL
  Filled 2011-10-28 (×6): qty 1

## 2011-10-28 MED ORDER — METOPROLOL TARTRATE 25 MG/10 ML ORAL SUSPENSION
12.5000 mg | Freq: Two times a day (BID) | ORAL | Status: DC
Start: 2011-10-29 — End: 2011-10-30
  Filled 2011-10-28 (×4): qty 5

## 2011-10-28 MED ORDER — INSULIN GLARGINE 100 UNIT/ML ~~LOC~~ SOLN
14.0000 [IU] | SUBCUTANEOUS | Status: DC
  Administered 2011-10-28 – 2011-11-02 (×6): 14 [IU] via SUBCUTANEOUS
  Administered 2011-11-03: 06:00:00 via SUBCUTANEOUS

## 2011-10-28 MED ORDER — FUROSEMIDE 10 MG/ML IJ SOLN
8.0000 mg/h | INTRAMUSCULAR | Status: DC
  Administered 2011-10-28: 6 mg/h via INTRAVENOUS
  Administered 2011-10-28 – 2011-10-29 (×2): 8 mg/h via INTRAVENOUS
  Filled 2011-10-28 (×3): qty 25

## 2011-10-28 MED ORDER — INSULIN ASPART 100 UNIT/ML ~~LOC~~ SOLN
0.0000 [IU] | SUBCUTANEOUS | Status: DC
  Administered 2011-10-28 (×2): 4 [IU] via SUBCUTANEOUS
  Administered 2011-10-28 – 2011-10-29 (×4): 2 [IU] via SUBCUTANEOUS

## 2011-10-28 MED ORDER — INSULIN ASPART 100 UNIT/ML ~~LOC~~ SOLN: 0.0000 [IU] | SUBCUTANEOUS | Status: DC

## 2011-10-28 MED ORDER — FUROSEMIDE 10 MG/ML IJ SOLN
40.0000 mg | Freq: Once | INTRAMUSCULAR | Status: AC
  Administered 2011-10-28: 40 mg via INTRAVENOUS

## 2011-10-28 MED ORDER — FUROSEMIDE 10 MG/ML IJ SOLN: 20.0000 mg | Freq: Once | INTRAMUSCULAR | Status: DC

## 2011-10-28 MED FILL — Heparin Sodium (Porcine) Inj 1000 Unit/ML: INTRAMUSCULAR | Qty: 20 | Status: AC

## 2011-10-28 MED FILL — Sodium Bicarbonate IV Soln 8.4%: INTRAVENOUS | Qty: 50 | Status: AC

## 2011-10-28 MED FILL — Sodium Chloride Irrigation Soln 0.9%: Qty: 3000 | Status: AC

## 2011-10-28 MED FILL — Lidocaine HCl IV Inj 20 MG/ML: INTRAVENOUS | Qty: 5 | Status: AC

## 2011-10-28 MED FILL — Mannitol IV Soln 20%: INTRAVENOUS | Qty: 500 | Status: AC

## 2011-10-28 MED FILL — Sodium Chloride IV Soln 0.9%: INTRAVENOUS | Qty: 1000 | Status: AC

## 2011-10-28 MED FILL — Heparin Sodium (Porcine) Inj 1000 Unit/ML: INTRAMUSCULAR | Qty: 30 | Status: AC

## 2011-10-28 MED FILL — Electrolyte-R (PH 7.4) Solution: INTRAVENOUS | Qty: 4000 | Status: AC

## 2011-10-28 NOTE — Progress Notes (Addendum)
Patient ID: James [name], male   DOB: Nov 24, 1945, 66 y.o.   MRN: [id number]                   [address]            [place] [address]          [phone]     1 Day Post-Op Procedure(s) (LRB): CORONARY ARTERY BYPASS GRAFTING (CABG) (N/A)  Total Length of Stay:  LOS: 5 days  BP 109/54  Pulse 80  Temp 98.2 F (36.8 C) (Oral)  Resp 15  Ht 5\' 9"  (1.753 m)  Wt 257 lb 8 oz (116.8 kg)  BMI 38.03 kg/m2  SpO2 96%     . sodium chloride 20 mL/hr (10/27/11 1445)  . sodium chloride 20 mL/hr (10/27/11 1445)  . sodium chloride 250 mL (10/28/11 0610)  . DOPamine 3 mcg/kg/min (10/28/11 1500)  . furosemide (LASIX) infusion 8 mg/hr (10/28/11 1330)  . insulin (NOVOLIN-R) infusion Stopped (10/28/11 0600)  . phenylephrine (NEO-SYNEPHRINE) Adult infusion 30 mcg/min (10/28/11 1500)  . DISCONTD: dexmedetomidine Stopped (10/27/11 2230)  . DISCONTD: lactated ringers 20 mL/hr (10/27/11 1445)  . DISCONTD: nitroGLYCERIN 0 mcg/min (10/27/11 1600)     Lab Results  Component Value Date   WBC 17.3* 10/28/2011   HGB 10.9* 10/28/2011   HCT 32.0* 10/28/2011   PLT 146* 10/28/2011   GLUCOSE 173* 10/28/2011   CHOL 139 10/24/2011   TRIG 102 10/24/2011   HDL 47 10/24/2011   LDLCALC 72 10/24/2011   ALT 22 10/23/2011   AST 19 10/23/2011   NA 136 10/28/2011   K 4.5 10/28/2011   CL 100 10/28/2011   CREATININE 2.10* 10/28/2011   BUN 32* 10/28/2011   CO2 25 10/28/2011   TSH 1.557 10/23/2011   INR 1.38 10/27/2011   HGBA1C 7.8* 10/23/2011   Cr now up to 2.10, still on dopamine 450 ml of urine since 7:00 am, 200 from ct  Restart hourly urine op monitoring   Delight Ovens MD  Beeper [phone] Office [phone] 10/28/2011 5:38 PM

## 2011-10-28 NOTE — Anesthesia Postprocedure Evaluation (Signed)
  Anesthesia Post-op Note  Patient: James [name] [facility]  Procedure(s) Performed: Procedure(s) (LRB) with comments: CORONARY ARTERY BYPASS GRAFTING (CABG) (N/A) - Coronary Artery Bypass Grafting times four using left internal mammary artery and right greater saphenous vein endoscopically harvested  Patient Location: ICU  Anesthesia Type: General  Level of Consciousness: awake, alert  and oriented  Airway and Oxygen Therapy: Patient Spontanous Breathing and Patient connected to nasal cannula oxygen  Post-op Pain: none  Post-op Assessment: Post-op Vital signs reviewed, Patient's Cardiovascular Status Stable, Respiratory Function Stable, Patent Airway, No signs of Nausea or vomiting and Adequate PO intake  Post-op Vital Signs: Reviewed and stable  Complications: No apparent anesthesia complications

## 2011-10-28 NOTE — Progress Notes (Signed)
Doing well POD#1 from CABG.  Currently AV paced.  Weaning pressors as BP tolerates.    Volume overloaded and diuresing as BP tolerates.    Heart rhythm stable.

## 2011-10-28 NOTE — Progress Notes (Addendum)
1 Day Post-Op Procedure(s) (LRB): CORONARY ARTERY BYPASS GRAFTING (CABG) (N/A)  Subjective: Patient awake and alert.  Objective: Vital signs in last 24 hours: Patient Vitals for the past 24 hrs:  BP Temp Temp src Pulse Resp SpO2 Weight  10/28/11 0700 67/44 mmHg 100 F (37.8 C) - 94  24  99 % -  10/28/11 0630 75/47 mmHg 100 F (37.8 C) - 92  25  98 % -  10/28/11 0600 74/24 mmHg - - - 36  - 257 lb 8 oz (116.8 kg)  10/28/11 0500 93/52 mmHg 100.4 F (38 C) - 92  26  98 % -  10/28/11 0400 87/52 mmHg 100.8 F (38.2 C) Core 91  30  97 % -  10/28/11 0300 82/41 mmHg 100.8 F (38.2 C) - 95  33  97 % -  10/28/11 0200 90/45 mmHg 100.9 F (38.3 C) - 91  30  97 % -  10/28/11 0100 89/51 mmHg 101.5 F (38.6 C) - 91  - 99 % -  10/28/11 0000 78/50 mmHg 101.7 F (38.7 C) Core 91  29  98 % -  10/27/11 2300 91/58 mmHg 101.7 F (38.7 C) - 92  33  99 % -  10/27/11 2245 85/51 mmHg 101.7 F (38.7 C) - 69  30  99 % -  10/27/11 2230 123/99 mmHg 101.3 F (38.5 C) - 93  34  98 % -  10/27/11 2215 107/73 mmHg 101.1 F (38.4 C) - 91  26  100 % -  10/27/11 2200 108/59 mmHg 100.8 F (38.2 C) - 92  27  100 % -  10/27/11 2145 94/53 mmHg 100.9 F (38.3 C) - 91  28  100 % -  10/27/11 2130 86/54 mmHg 101.3 F (38.5 C) - 90  19  100 % -  10/27/11 2115 85/52 mmHg 101.1 F (38.4 C) - 90  23  100 % -  10/27/11 2100 82/56 mmHg 101.1 F (38.4 C) Core 90  18  100 % -  10/27/11 2059 111/57 mmHg - - 90  24  100 % -  10/27/11 2045 95/56 mmHg 100.9 F (38.3 C) - 90  18  100 % -  10/27/11 2030 98/60 mmHg 100.8 F (38.2 C) - 90  17  100 % -  10/27/11 2015 98/62 mmHg 100.8 F (38.2 C) - 90  19  100 % -  10/27/11 2000 105/64 mmHg 100.6 F (38.1 C) Core 90  18  100 % -  10/27/11 1945 80/51 mmHg 100.4 F (38 C) - 90  19  100 % -  10/27/11 1930 85/55 mmHg 100.4 F (38 C) - 90  18  100 % -  10/27/11 1915 89/50 mmHg 100.2 F (37.9 C) - 90  21  100 % -  10/27/11 1900 93/58 mmHg 100 F (37.8 C) Core 90  18  100 % -    10/27/11 1845 91/58 mmHg 99.9 F (37.7 C) - 90  19  100 % -  10/27/11 1830 96/58 mmHg 99.7 F (37.6 C) - 90  17  100 % -  10/27/11 1800 - - Core - - - -  10/27/11 1745 96/59 mmHg 99.1 F (37.3 C) - 90  20  100 % -  10/27/11 1730 107/65 mmHg 99 F (37.2 C) - 90  17  100 % -  10/27/11 1715 101/65 mmHg 99 F (37.2 C) - 90  18  100 % -  10/27/11 1700 85/54 mmHg 98.6 F (  37 C) Core 90  25  100 % -  10/27/11 1645 - 98.6 F (37 C) - 91  27  100 % -  10/27/11 1630 - 98.4 F (36.9 C) - 90  18  99 % -  10/27/11 1615 - 98.2 F (36.8 C) - 76  25  99 % -  10/27/11 1600 76/50 mmHg 98.1 F (36.7 C) Core 90  21  99 % -  10/27/11 1545 92/56 mmHg 97.7 F (36.5 C) - 90  17  99 % -  10/27/11 1530 105/64 mmHg 97.5 F (36.4 C) - 90  12  99 % -  10/27/11 1515 - 97.5 F (36.4 C) - 90  23  99 % -  10/27/11 1500 84/56 mmHg 97.2 F (36.2 C) Core 90  22  98 % -  10/27/11 1445 - 97.3 F (36.3 C) - 91  16  99 % -   Pre op weight 117 kg Current Weight  10/28/11 257 lb 8 oz (116.8 kg)    Hemodynamic parameters for last 24 hours: PAP: (29-70)/(17-35) 43/21 mmHg CO:  [3.4 L/min-6.5 L/min] 6.5 L/min CI:  [1.5 L/min/m2-2.8 L/min/m2] 2.8 L/min/m2  Intake/Output from previous day: 09/09 0701 - 09/10 0700 In: 8252.5 [P.O.:480; I.V.:5244.5; Blood:708; IV Piggyback:1820] Out: 4670 [Urine:2540; Emesis/NG output:100; Blood:900; Chest Tube:1130]   Physical Exam:  Cardiovascular:RRR;rub with chest tube in place. Pulmonary: Diminished at bases bilaterally; no rales, wheezes, or rhonchi. Abdomen: Soft, non tender, bowel sounds present. Extremities: Mild bilateral lower extremity edema. Wound: Dressings are clean and dry.    Lab Results: CBC: Basename 10/28/11 0330 10/27/11 2024  WBC 17.3* 14.4*  HGB 10.6* 11.4*  HCT 30.8* 32.3*  PLT 146* 163   BMET:  Basename 10/28/11 0330 10/27/11 2024 10/27/11 2020 10/26/11 2229  NA 138 -- 139 --  K 4.5 -- 4.8 --  CL 104 -- 104 --  CO2 25 -- -- 21  GLUCOSE  107* -- 101* --  BUN 23 -- 19 --  CREATININE 1.46* 1.02 -- --  CALCIUM 7.9* -- -- 9.5    PT/INR:  Lab Results  Component Value Date   INR 1.38 10/27/2011   INR 1.08 10/26/2011   ABG:  INR: Will add last result for INR, ABG once components are confirmed Will add last 4 CBG results once components are confirmed  Assessment/Plan:  1. CV - BP has been labile.AV paced at 90.Wean dopamine and Neo synephrine as tolerates.Will start Lopressor in am. 2.  Pulmonary - Chest tubes with 1130 cc of output. Chest tubes to remain to suction for now.CXR this am shows low lung volumes, no ptx, atelectasis at bases L>R.Encourage incentive spirometer. 3. Volume Overload - Begin diuresis slowly 4.  Acute blood loss anemia - H and H 10.6 and 30.8. 5.Creatinine 1.46 this am.Monitor 6.Pre op HGA1C 7.8.CBGs 107/96/91.Will need to be started on oral medicine once creatinine decreases. Will need follow up as an outpatient. 7.Mild thrombocytopenia-platelets 146,000. 8.See progression orders  ZIMMERMAN,DONIELLE MPA-C 10/28/2011  Wean neo-as blood pressure allows Follow increased creatinine continue renal dose dopamine Leave MTs in place because of her and perioperative bleeding

## 2011-10-29 ENCOUNTER — Inpatient Hospital Stay (HOSPITAL_COMMUNITY): Payer: Medicare Other

## 2011-10-29 LAB — BASIC METABOLIC PANEL
BUN: 31 mg/dL — ABNORMAL HIGH (ref 6–23)
BUN: 36 mg/dL — ABNORMAL HIGH (ref 6–23)
CO2: 25 mEq/L (ref 19–32)
Calcium: 7.9 mg/dL — ABNORMAL LOW (ref 8.4–10.5)
Chloride: 98 mEq/L (ref 96–112)
Chloride: 100 mEq/L (ref 96–112)
Creatinine, Ser: 1.85 mg/dL — ABNORMAL HIGH (ref 0.50–1.35)
GFR calc Af Amer: 42 mL/min — ABNORMAL LOW (ref >90)
GFR calc Af Amer: 42 mL/min — ABNORMAL LOW (ref >90)
GFR calc non Af Amer: 36 mL/min — ABNORMAL LOW (ref >90)
GFR calc non Af Amer: 36 mL/min — ABNORMAL LOW (ref >90)
Glucose, Bld: 105 mg/dL — ABNORMAL HIGH (ref 70–99)
Potassium: 3.7 mEq/L (ref 3.5–5.1)
Potassium: 3.7 mEq/L (ref 3.5–5.1)
Sodium: 132 mEq/L — ABNORMAL LOW (ref 135–145)
Sodium: 135 mEq/L (ref 135–145)

## 2011-10-29 LAB — CBC
HCT: 28.1 % — ABNORMAL LOW (ref 39.0–52.0)
Hemoglobin: 9.7 g/dL — ABNORMAL LOW (ref 13.0–17.0)
MCHC: 34.5 g/dL (ref 30.0–36.0)
RBC: 3.31 MIL/uL — ABNORMAL LOW (ref 4.22–5.81)
WBC: 14.3 10*3/uL — ABNORMAL HIGH (ref 4.0–10.5)

## 2011-10-29 LAB — GLUCOSE, CAPILLARY
Glucose-Capillary: 123 mg/dL — ABNORMAL HIGH (ref 70–99)
Glucose-Capillary: 123 mg/dL — ABNORMAL HIGH (ref 70–99)
Glucose-Capillary: 142 mg/dL — ABNORMAL HIGH (ref 70–99)
Glucose-Capillary: 96 mg/dL (ref 70–99)

## 2011-10-29 MED ORDER — METOCLOPRAMIDE HCL 5 MG/ML IJ SOLN
10.0000 mg | Freq: Four times a day (QID) | INTRAMUSCULAR | Status: AC
  Administered 2011-10-29 – 2011-10-30 (×5): 10 mg via INTRAVENOUS
  Filled 2011-10-29 (×6): qty 2

## 2011-10-29 MED ORDER — POTASSIUM CHLORIDE CRYS ER 20 MEQ PO TBCR
EXTENDED_RELEASE_TABLET | ORAL | Status: AC
  Filled 2011-10-29: qty 2

## 2011-10-29 MED ORDER — POTASSIUM CHLORIDE CRYS ER 20 MEQ PO TBCR
40.0000 meq | EXTENDED_RELEASE_TABLET | Freq: Once | ORAL | Status: AC
  Administered 2011-10-29: 40 meq via ORAL

## 2011-10-29 MED ORDER — POTASSIUM CHLORIDE 10 MEQ/50ML IV SOLN
10.0000 meq | INTRAVENOUS | Status: DC
  Administered 2011-10-30: 10 meq via INTRAVENOUS
  Filled 2011-10-29: qty 150

## 2011-10-29 NOTE — Progress Notes (Addendum)
2 Days Post-Op Procedure(s) (LRB): CORONARY ARTERY BYPASS GRAFTING (CABG) (N/A)  Subjective: Patient with nausea this am. Denies abdominal pain or emesis.  Objective: Vital signs in last 24 hours: Patient Vitals for the past 24 hrs:  BP Temp Temp src Pulse Resp SpO2 Weight  10/29/11 0720 - 98.6 F (37 C) Oral - - - -  10/29/11 0600 103/61 mmHg - - 88  16  97 % 270 lb 15.1 oz (122.9 kg)  10/29/11 0500 116/47 mmHg - - 88  18  97 % -  10/29/11 0400 108/47 mmHg 97.8 F (36.6 C) Oral 88  16  98 % -  10/29/11 0300 103/47 mmHg - - 88  14  97 % -  10/29/11 0200 102/48 mmHg - - 88  18  97 % -  10/29/11 0100 109/52 mmHg - - 88  16  98 % -  10/29/11 0000 109/51 mmHg 97.8 F (36.6 C) Oral 87  14  98 % -  10/28/11 2300 102/51 mmHg - - 87  15  99 % -  10/28/11 2200 87/52 mmHg - - 87  15  98 % -  10/28/11 2100 109/49 mmHg - - 88  22  95 % -  10/28/11 2022 - 97.6 F (36.4 C) Oral - - - -  10/28/11 2000 111/50 mmHg - - 80  13  97 % -  10/28/11 1900 104/48 mmHg - - 80  14  97 % -  10/28/11 1800 - - - 80  14  95 % -  10/28/11 1730 - - - 80  11  95 % -  10/28/11 1700 - - - 80  21  94 % -  10/28/11 1630 99/54 mmHg - - 79  19  94 % -  10/28/11 1600 86/44 mmHg - - 80  15  92 % -  10/28/11 1550 - 98.2 F (36.8 C) Oral - - - -  10/28/11 1530 - - - 79  35  95 % -  10/28/11 1500 109/54 mmHg - - 80  15  96 % -  10/28/11 1445 95/54 mmHg - - 80  20  97 % -  10/28/11 1430 100/56 mmHg - - 80  16  96 % -  10/28/11 1415 84/48 mmHg - - 80  21  95 % -  10/28/11 1400 85/53 mmHg - - 80  14  94 % -  10/28/11 1300 104/59 mmHg - - 80  19  93 % -  10/28/11 1200 130/113 mmHg 98.6 F (37 C) - 80  26  96 % -  10/28/11 1100 98/68 mmHg 98.8 F (37.1 C) - 83  20  97 % -  10/28/11 1000 83/49 mmHg 99 F (37.2 C) - 83  18  97 % -  10/28/11 0900 111/55 mmHg 99 F (37.2 C) - 91  24  97 % -   Pre op weight 117 kg Current Weight  10/29/11 270 lb 15.1 oz (122.9 kg)    Hemodynamic parameters for last 24 hours: PAP:  (45-54)/(20-26) 46/20 mmHg  Intake/Output from previous day: 09/10 0701 - 09/11 0700 In: 1869.6 [P.O.:200; I.V.:1617.6; IV Piggyback:52] Out: 3305 [Urine:2925; Chest Tube:380]   Physical Exam:  Cardiovascular:Paced;rub with chest tubes in place. Pulmonary: Diminished at bases bilaterally; no rales, wheezes, or rhonchi. Abdomen: Soft, non tender, hypoactive bowel sounds present, distended and tympanic. Extremities: Mild bilateral lower extremity edema. Wound: Dressings are clean and dry.    Lab Results: CBC:  Basename 10/29/11 0415 10/28/11 1723 10/28/11 1713  WBC 14.3* -- 21.7*  HGB 9.7* 10.9* --  HCT 28.1* 32.0* --  PLT 128* -- 136*   BMET:   Basename 10/29/11 0415 10/28/11 1723 10/28/11 0330  NA 132* 136 --  K 3.7 4.5 --  CL 98 100 --  CO2 27 -- 25  GLUCOSE 128* 173* --  BUN 31* 32* --  CREATININE 1.85* 2.10* --  CALCIUM 8.0* -- 7.9*    PT/INR:  Lab Results  Component Value Date   INR 1.38 10/27/2011   INR 1.08 10/26/2011   ABG:  INR: Will add last result for INR, ABG once components are confirmed Will add last 4 CBG results once components are confirmed  Assessment/Plan:  1. CV - Neo synephrine stopped yesterday. On dopamine gttp and Lopressor po. 2.  Pulmonary - Chest tubes with 380 cc of output. Chest tubes to be removed.CXR this am shows low lung volumes, no ptx, atelectasis at bases L>R.Encourage incentive spirometer and flutter valve. 3. Volume Overload - Continue with Lasix drip. 4.  Acute blood loss anemia - H and H 9.7 and 28.1.  5.Creatinine 1.85 this am.Monitor 6.Pre op HGA1C 7.8.CBGs 116/123/123. Will need to be started on oral medicine once creatinine decreases. Will need follow up as an outpatient. 7.Mild thrombocytopenia-platelets 128,000. 8.Foley to remain;remove a line. 9.GI-Zofran PRN nausea. Continue diet for now, but may need to make NPO.  ZIMMERMAN,DONIELLE MPA-C 10/29/2011

## 2011-10-29 NOTE — Progress Notes (Signed)
T[facility] BRIEF SICU PROGRESS NOTE  2 Days Post-Op  S/P Procedure(s) (LRB): CORONARY ARTERY BYPASS GRAFTING (CABG) (N/A)   Stable day AAI paced w/ stable BP O2 sats 93-97% on 2 L/min Diuresing fairly well  Plan: Continue current plan  OWEN,CLARENCE H 10/29/2011 6:25 PM

## 2011-10-29 NOTE — Progress Notes (Signed)
2 Days Post-Op Procedure(s) (LRB): CORONARY ARTERY BYPASS GRAFTING (CABG) (N/A) Subjective: CABG with postop renal,pulmonary insufficiency  Objective: Vital signs in last 24 hours: Temp:  [97.6 F (36.4 C)-98.8 F (37.1 C)] 98.8 F (37.1 C) (09/11 1633) Pulse Rate:  [80-93] 87  (09/11 1800) Cardiac Rhythm:  [-] Atrial paced (09/11 0730) Resp:  [12-35] 21  (09/11 1800) BP: (87-136)/(42-70) 117/57 mmHg (09/11 1800) SpO2:  [93 %-99 %] 97 % (09/11 1800) Arterial Line BP: (99-138)/(40-60) 99/40 mmHg (09/11 0800) Weight:  [270 lb 15.1 oz (122.9 kg)] 270 lb 15.1 oz (122.9 kg) (09/11 0600)  Hemodynamic parameters for last 24 hours:   NSR Intake/Output from previous day: 09/10 0701 - 09/11 0700 In: 1869.6 [P.O.:200; I.V.:1617.6; IV Piggyback:52] Out: 3305 [Urine:2925; Chest Tube:380] Intake/Output this shift: Total I/O In: 479.8 [I.V.:423.8; IV Piggyback:56] Out: 1300 [Urine:1150; Chest Tube:150]  Better u/o with improved creatinine Atelectasis LLL Lab Results:  Basename 10/29/11 0415 10/28/11 1723 10/28/11 1713  WBC 14.3* -- 21.7*  HGB 9.7* 10.9* --  HCT 28.1* 32.0* --  PLT 128* -- 136*   BMET:  Basename 10/29/11 0415 10/28/11 1723 10/28/11 0330  NA 132* 136 --  K 3.7 4.5 --  CL 98 100 --  CO2 27 -- 25  GLUCOSE 128* 173* --  BUN 31* 32* --  CREATININE 1.85* 2.10* --  CALCIUM 8.0* -- 7.9*    PT/INR:  Basename 10/27/11 1430  LABPROT 17.2*  INR 1.38   ABG    Component Value Date/Time   PHART 7.309* 10/27/2011 2223   HCO3 22.7 10/27/2011 2223   TCO2 21 10/28/2011 1723   ACIDBASEDEF 3.0* 10/27/2011 2223   O2SAT 97.0 10/27/2011 2223   CBG (last 3)   Basename 10/29/11 1629 10/29/11 1137 10/29/11 0718  GLUCAP 96 142* 123*    Assessment/Plan: S/P Procedure(s) (LRB): CORONARY ARTERY BYPASS GRAFTING (CABG) (N/A) Continue foley due to urinary output monitoring   LOS: 6 days    James [name],James [name] 10/29/2011

## 2011-10-30 ENCOUNTER — Inpatient Hospital Stay (HOSPITAL_COMMUNITY): Payer: Medicare Other

## 2011-10-30 LAB — COMPREHENSIVE METABOLIC PANEL
ALT: 24 U/L (ref 0–53)
AST: 39 U/L — ABNORMAL HIGH (ref 0–37)
Albumin: 2.9 g/dL — ABNORMAL LOW (ref 3.5–5.2)
Alkaline Phosphatase: 51 U/L (ref 39–117)
BUN: 35 mg/dL — ABNORMAL HIGH (ref 6–23)
CO2: 29 mEq/L (ref 19–32)
Calcium: 8.3 mg/dL — ABNORMAL LOW (ref 8.4–10.5)
Chloride: 98 mEq/L (ref 96–112)
Creatinine, Ser: 1.87 mg/dL — ABNORMAL HIGH (ref 0.50–1.35)
GFR calc Af Amer: 42 mL/min — ABNORMAL LOW (ref >90)
GFR calc non Af Amer: 36 mL/min — ABNORMAL LOW (ref >90)
Glucose, Bld: 108 mg/dL — ABNORMAL HIGH (ref 70–99)
Potassium: 3.4 mEq/L — ABNORMAL LOW (ref 3.5–5.1)
Sodium: 136 mEq/L (ref 135–145)
Total Bilirubin: 0.5 mg/dL (ref 0.3–1.2)
Total Protein: 6 g/dL (ref 6.0–8.3)

## 2011-10-30 LAB — CBC
HCT: 28 % — ABNORMAL LOW (ref 39.0–52.0)
Hemoglobin: 9.6 g/dL — ABNORMAL LOW (ref 13.0–17.0)
MCH: 29.4 pg (ref 26.0–34.0)
MCHC: 34.3 g/dL (ref 30.0–36.0)
MCV: 85.9 fL (ref 78.0–100.0)
Platelets: 129 10*3/uL — ABNORMAL LOW (ref 150–400)
RBC: 3.26 MIL/uL — ABNORMAL LOW (ref 4.22–5.81)
RDW: 13.4 % (ref 11.5–15.5)
WBC: 9.9 10*3/uL (ref 4.0–10.5)

## 2011-10-30 LAB — GLUCOSE, CAPILLARY
Glucose-Capillary: 110 mg/dL — ABNORMAL HIGH (ref 70–99)
Glucose-Capillary: 139 mg/dL — ABNORMAL HIGH (ref 70–99)
Glucose-Capillary: 104 mg/dL — ABNORMAL HIGH (ref 70–99)

## 2011-10-30 MED ORDER — ATORVASTATIN CALCIUM 80 MG PO TABS: 80.0000 mg | ORAL_TABLET | Freq: Every day | ORAL | Status: DC

## 2011-10-30 MED ORDER — AMIODARONE HCL IN DEXTROSE 360-4.14 MG/200ML-% IV SOLN: 30.0000 mg/h | INTRAVENOUS | Status: DC

## 2011-10-30 MED ORDER — OXYCODONE HCL 5 MG PO TABS: 5.0000 mg | ORAL_TABLET | ORAL | Status: DC | PRN

## 2011-10-30 MED ORDER — TRAMADOL HCL 50 MG PO TABS: 50.0000 mg | ORAL_TABLET | ORAL | Status: DC | PRN

## 2011-10-30 MED ORDER — AMIODARONE HCL IN DEXTROSE 360-4.14 MG/200ML-% IV SOLN
60.0000 mg/h | INTRAVENOUS | Status: AC
  Administered 2011-10-30 (×2): 60 mg/h via INTRAVENOUS
  Filled 2011-10-30 (×2): qty 200

## 2011-10-30 MED ORDER — MOVING RIGHT ALONG BOOK
Freq: Once | Status: AC
  Administered 2011-10-30: 15:00:00
  Filled 2011-10-30: qty 1

## 2011-10-30 MED ORDER — FUROSEMIDE 40 MG PO TABS
40.0000 mg | ORAL_TABLET | Freq: Every day | ORAL | Status: DC
  Administered 2011-10-31 – 2011-11-03 (×4): 40 mg via ORAL
  Filled 2011-10-30 (×5): qty 1

## 2011-10-30 MED ORDER — BISACODYL 10 MG RE SUPP
10.0000 mg | Freq: Once | RECTAL | Status: AC
  Administered 2011-10-31: 10 mg via RECTAL
  Filled 2011-10-30: qty 1

## 2011-10-30 MED ORDER — PANTOPRAZOLE SODIUM 40 MG PO TBEC: 40.0000 mg | DELAYED_RELEASE_TABLET | Freq: Every day | ORAL | Status: DC

## 2011-10-30 MED ORDER — INSULIN ASPART 100 UNIT/ML ~~LOC~~ SOLN
0.0000 [IU] | Freq: Three times a day (TID) | SUBCUTANEOUS | Status: DC
  Administered 2011-10-30 – 2011-11-03 (×9): 2 [IU] via SUBCUTANEOUS

## 2011-10-30 MED ORDER — POTASSIUM CHLORIDE CRYS ER 20 MEQ PO TBCR
20.0000 meq | EXTENDED_RELEASE_TABLET | Freq: Every day | ORAL | Status: DC
  Administered 2011-10-30 – 2011-11-03 (×5): 20 meq via ORAL
  Filled 2011-10-30 (×4): qty 1
  Filled 2011-10-30: qty 2

## 2011-10-30 MED ORDER — SODIUM CHLORIDE 0.9 % IJ SOLN: 3.0000 mL | Freq: Two times a day (BID) | INTRAMUSCULAR | Status: DC

## 2011-10-30 MED ORDER — POTASSIUM CHLORIDE 10 MEQ/50ML IV SOLN
INTRAVENOUS | Status: AC
  Administered 2011-10-30: 10 meq via INTRAVENOUS
  Filled 2011-10-30: qty 150

## 2011-10-30 MED ORDER — POTASSIUM CHLORIDE 10 MEQ/50ML IV SOLN
10.0000 meq | INTRAVENOUS | Status: AC | PRN
  Administered 2011-10-30 (×3): 10 meq via INTRAVENOUS

## 2011-10-30 MED ORDER — FUROSEMIDE 10 MG/ML IJ SOLN
40.0000 mg | Freq: Once | INTRAMUSCULAR | Status: AC
  Administered 2011-10-30: 40 mg via INTRAVENOUS

## 2011-10-30 MED ORDER — SODIUM CHLORIDE 0.9 % IJ SOLN: 3.0000 mL | INTRAMUSCULAR | Status: DC | PRN

## 2011-10-30 MED ORDER — AMIODARONE HCL 200 MG PO TABS
400.0000 mg | ORAL_TABLET | Freq: Once | ORAL | Status: AC
  Administered 2011-10-30: 400 mg via ORAL
  Filled 2011-10-30: qty 2

## 2011-10-30 MED ORDER — SODIUM CHLORIDE 0.9 % IV SOLN: 250.0000 mL | INTRAVENOUS | Status: DC | PRN

## 2011-10-30 NOTE — Progress Notes (Addendum)
3 Days Post-Op Procedure(s) (LRB): CORONARY ARTERY BYPASS GRAFTING (CABG) (N/A)  Subjective: Patient no longer has nausea. He is passing a little flatus.  Objective: Vital signs in last 24 hours: Patient Vitals for the past 24 hrs:  BP Temp Temp src Pulse Resp SpO2 Weight  10/30/11 0730 109/57 mmHg - - 88  23  97 % -  10/30/11 0727 - 98.8 F (37.1 C) Oral - - - -  10/30/11 0700 125/60 mmHg - - 88  30  98 % -  10/30/11 0600 102/49 mmHg - - 87  18  99 % 263 lb 0.1 oz (119.3 kg)  10/30/11 0500 99/54 mmHg - - 87  18  97 % -  10/30/11 0400 97/44 mmHg 98.6 F (37 C) Oral 88  17  97 % -  10/30/11 0300 109/49 mmHg - - 87  16  97 % -  10/30/11 0200 112/53 mmHg - - 87  16  98 % -  10/30/11 0100 104/52 mmHg - - 87  28  99 % -  10/30/11 0000 106/48 mmHg 98.7 F (37.1 C) Oral 88  19  96 % -  10/29/11 2300 112/52 mmHg - - 88  22  97 % -  10/29/11 2208 110/43 mmHg - - 88  - - -  10/29/11 2200 110/43 mmHg - - 87  20  100 % -  10/29/11 2100 126/56 mmHg - - 88  21  99 % -  10/29/11 2019 - 99 F (37.2 C) Oral - - - -  10/29/11 2000 98/48 mmHg - - 88  15  99 % -  10/29/11 1900 117/43 mmHg - - 88  25  94 % -  10/29/11 1830 117/50 mmHg - - 87  22  95 % -  10/29/11 1800 117/57 mmHg - - 87  21  97 % -  10/29/11 1730 113/42 mmHg - - 87  28  94 % -  10/29/11 1700 116/47 mmHg - - 88  22  97 % -  10/29/11 1633 - 98.8 F (37.1 C) Oral - - - -  10/29/11 1630 - - - - 26  - -  10/29/11 1600 133/70 mmHg - - 87  23  93 % -  10/29/11 1530 121/63 mmHg - - 87  13  96 % -  10/29/11 1500 104/58 mmHg - - 88  12  97 % -  10/29/11 1430 115/43 mmHg - - 87  13  97 % -  10/29/11 1400 133/53 mmHg - - 87  18  93 % -  10/29/11 1330 131/57 mmHg - - 88  19  94 % -  10/29/11 1300 131/57 mmHg - - 87  18  98 % -  10/29/11 1230 132/60 mmHg - - 88  18  97 % -  10/29/11 1200 136/62 mmHg - - 93  35  93 % -  10/29/11 1139 - 98.8 F (37.1 C) Oral - - - -  10/29/11 1130 117/52 mmHg - - 87  19  96 % -  10/29/11 1100 117/54  mmHg - - 88  20  96 % -  10/29/11 1030 114/61 mmHg - - 88  28  94 % -  10/29/11 1000 129/43 mmHg - - 88  30  96 % -  10/29/11 0957 130/44 mmHg - - 88  - - -  10/29/11 0930 113/51 mmHg - - 88  19  96 % -  10/29/11 0900 124/42 mmHg - -  88  25  96 % -  10/29/11 0830 131/53 mmHg - - 88  25  95 % -   Pre op weight 117 kg Current Weight  10/30/11 263 lb 0.1 oz (119.3 kg)      Intake/Output from previous day: 09/11 0701 - 09/12 0700 In: 1131.2 [P.O.:60; I.V.:1013.2; IV Piggyback:58] Out: 5265 [Urine:5075; Chest Tube:190]   Physical Exam:  Cardiovascular:Paced.RRR. Pulmonary: Diminished at bases bilaterally L>R; no rales, wheezes, or rhonchi. Abdomen: Soft, non tender, hyperactive bowel sounds present, distended and tympanic. Extremities: Mild bilateral lower extremity edema. Wound: Dressings are clean and dry.    Lab Results: CBC:  Basename 10/30/11 0428 10/29/11 0415  WBC 9.9 14.3*  HGB 9.6* 9.7*  HCT 28.0* 28.1*  PLT 129* 128*   BMET:   Basename 10/30/11 0428 10/29/11 1745  NA 136 135  K 3.4* 3.7  CL 98 100  CO2 29 25  GLUCOSE 108* 105*  BUN 35* 36*  CREATININE 1.87* 1.85*  CALCIUM 8.3* 7.9*    PT/INR:  Lab Results  Component Value Date   INR 1.38 10/27/2011   INR 1.08 10/26/2011   ABG:  INR: Will add last result for INR, ABG once components are confirmed Will add last 4 CBG results once components are confirmed  Assessment/Plan:  1. CV - Being paced this am (set at 88).On Lopressor 12.5 bid 2.  Pulmonary - Chest tubes with 190 cc of output. Chest tube probably to be removed .CXR this am shows low lung volumes, no ptx, atelectasis at bases L>R.Encourage incentive spirometer and flutter valve.Wean O2 as tolerates. 3. Volume Overload - On Lasix drip.Consider changing to oral. 4.  Acute blood loss anemia - H and H stable at 9.6 and 28.  5.Creatinine 1.87 this am.Monitor.Foley still remains. Consider removing. 6.Pre op HGA1C 7.8.CBGs 120/85/115 . Will need to be  started on oral medicine once creatinine decreases. Will need follow up as an outpatient. 7.Mild thrombocytopenia-platelets 129,000. 8.Gently supplement potassium 9.Remove sleeve 10.PT to evaluate. 11.Hope to transfer to [facility] soon.  ZIMMERMAN,DONIELLE MPA-C 10/30/2011  patient examined and medical record reviewed,agree with above note.Transfer to stepdown VAN TRIGT III,PETER 10/30/2011

## 2011-10-30 NOTE — Evaluation (Signed)
Physical Therapy Evaluation Patient Details Name: James [name] MRN: [id number] DOB: [birth year] Today's Date: 10/30/2011 Time: 9562-1308 PT Time Calculation (min): 17 min  PT Assessment / Plan / Recommendation Clinical Impression  Pt adm for CABG.  Pt should make good progress with mobility and be able to return home.    PT Assessment  Patient needs continued PT services    Follow Up Recommendations  No PT follow up    Barriers to Discharge        Equipment Recommendations  None recommended by PT    Recommendations for Other Services     Frequency Min 3X/week    Precautions / Restrictions Precautions Precautions: Sternal   Pertinent Vitals/Pain Pt went into Afib with amb with HR 120s - 140's      Mobility  Bed Mobility Bed Mobility: Sit to Supine Sit to Supine: 3: Mod assist Details for Bed Mobility Assistance: assist to bring legs up Transfers Transfers: Sit to Stand;Stand to Sit Sit to Stand: 4: Min guard Stand to Sit: 4: Min guard;Without upper extremity assist;To bed Details for Transfer Assistance: pt brings hands to w/c for sit to stand to minimize Ambulation/Gait Ambulation/Gait Assistance: 4: Min guard Ambulation Distance (Feet): 350 Feet Assistive device: Other (Comment) (pushing w/c) Ambulation/Gait Assistance Details: Several standing rest breaks.  verbal cues to stand more erect. Gait Pattern: Decreased stride length;Step-through pattern;Trunk flexed    Exercises     PT Diagnosis: Difficulty walking  PT Problem List: Decreased activity tolerance;Decreased balance;Decreased mobility;Decreased knowledge of precautions PT Treatment Interventions: DME instruction;Gait training;Stair training;Functional mobility training;Patient/family education;Therapeutic activities;Balance training   PT Goals Acute Rehab PT Goals PT Goal Formulation: With patient Time For Goal Achievement: 11/06/11 Potential to Achieve Goals: Good Pt will go Supine/Side to Sit:  with modified independence PT Goal: Supine/Side to Sit - Progress: Goal set today Pt will go Sit to Supine/Side: with modified independence PT Goal: Sit to Supine/Side - Progress: Goal set today Pt will go Sit to Stand: with modified independence PT Goal: Sit to Stand - Progress: Goal set today Pt will go Stand to Sit: with modified independence PT Goal: Stand to Sit - Progress: Goal set today Pt will Ambulate: >150 feet;with modified independence;with least restrictive assistive device PT Goal: Ambulate - Progress: Goal set today  Visit Information  Last PT Received On: 10/30/11 Assistance Needed: +1    Subjective Data  Subjective: Pt states he is building a log cabin and has been staying with his parents. 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    Cognition  Overall Cognitive Status: Appears within functional limits for tasks assessed/performed Arousal/Alertness: Awake/alert Orientation Level: Appears intact for tasks assessed Behavior During Session: [facility] for tasks performed    Extremity/Trunk Assessment Right Lower Extremity Assessment RLE ROM/Strength/Tone: [facility] for tasks assessed Left Lower Extremity Assessment LLE ROM/Strength/Tone: [facility] for tasks assessed   Balance Static Standing Balance Static Standing - Balance Support: Right upper extremity supported Static Standing - Level of Assistance: 5: Stand by assistance  End of Session PT - End of  Session Activity Tolerance:  Patient tolerated treatment well Patient left: in bed Nurse Communication: Mobility status  GP     [facility] 10/30/2011, 11:48 AM  Skip Mayer PT [phone]

## 2011-10-30 NOTE — Progress Notes (Signed)
3 Days Post-Op Procedure(s) (LRB): CORONARY ARTERY BYPASS GRAFTING (CABG) (N/A)  Subjective: Patient no longer has nausea. He is passing a little flatus.  Objective: Vital signs in last 24 hours: Patient Vitals for the past 24 hrs:  BP Temp Temp src Pulse Resp SpO2 Weight  10/30/11 0730 109/57 mmHg - - 88  23  97 % -  10/30/11 0727 - 98.8 F (37.1 C) Oral - - - -  10/30/11 0700 125/60 mmHg - - 88  30  98 % -  10/30/11 0600 102/49 mmHg - - 87  18  99 % 263 lb 0.1 oz (119.3 kg)  10/30/11 0500 99/54 mmHg - - 87  18  97 % -  10/30/11 0400 97/44 mmHg 98.6 F (37 C) Oral 88  17  97 % -  10/30/11 0300 109/49 mmHg - - 87  16  97 % -  10/30/11 0200 112/53 mmHg - - 87  16  98 % -  10/30/11 0100 104/52 mmHg - - 87  28  99 % -  10/30/11 0000 106/48 mmHg 98.7 F (37.1 C) Oral 88  19  96 % -  10/29/11 2300 112/52 mmHg - - 88  22  97 % -  10/29/11 2208 110/43 mmHg - - 88  - - -  10/29/11 2200 110/43 mmHg - - 87  20  100 % -  10/29/11 2100 126/56 mmHg - - 88  21  99 % -  10/29/11 2019 - 99 F (37.2 C) Oral - - - -  10/29/11 2000 98/48 mmHg - - 88  15  99 % -  10/29/11 1900 117/43 mmHg - - 88  25  94 % -  10/29/11 1830 117/50 mmHg - - 87  22  95 % -  10/29/11 1800 117/57 mmHg - - 87  21  97 % -  10/29/11 1730 113/42 mmHg - - 87  28  94 % -  10/29/11 1700 116/47 mmHg - - 88  22  97 % -  10/29/11 1633 - 98.8 F (37.1 C) Oral - - - -  10/29/11 1630 - - - - 26  - -  10/29/11 1600 133/70 mmHg - - 87  23  93 % -  10/29/11 1530 121/63 mmHg - - 87  13  96 % -  10/29/11 1500 104/58 mmHg - - 88  12  97 % -  10/29/11 1430 115/43 mmHg - - 87  13  97 % -  10/29/11 1400 133/53 mmHg - - 87  18  93 % -  10/29/11 1330 131/57 mmHg - - 88  19  94 % -  10/29/11 1300 131/57 mmHg - - 87  18  98 % -  10/29/11 1230 132/60 mmHg - - 88  18  97 % -  10/29/11 1200 136/62 mmHg - - 93  35  93 % -  10/29/11 1139 - 98.8 F (37.1 C) Oral - - - -  10/29/11 1130 117/52 mmHg - - 87  19  96 % -  10/29/11 1100 117/54  mmHg - - 88  20  96 % -  10/29/11 1030 114/61 mmHg - - 88  28  94 % -  10/29/11 1000 129/43 mmHg - - 88  30  96 % -  10/29/11 0957 130/44 mmHg - - 88  - - -  10/29/11 0930 113/51 mmHg - - 88  19  96 % -  10/29/11 0900 124/42 mmHg - -  88  25  96 % -  10/29/11 0830 131/53 mmHg - - 88  25  95 % -  10/29/11 0800 120/53 mmHg - - 88  22  95 % -   Pre op weight 117 kg Current Weight  10/30/11 263 lb 0.1 oz (119.3 kg)      Intake/Output from previous day: 09/11 0701 - 09/12 0700 In: 1131.2 [P.O.:60; I.V.:1013.2; IV Piggyback:58] Out: 5265 [Urine:5075; Chest Tube:190]   Physical Exam:  Cardiovascular:Paced.RRR. Pulmonary: Diminished at bases bilaterally L>R; no rales, wheezes, or rhonchi. Abdomen: Soft, non tender, hyperactive bowel sounds present, distended and tympanic. Extremities: Mild bilateral lower extremity edema. Wound: Dressings are clean and dry.    Lab Results: CBC:  Basename 10/30/11 0428 10/29/11 0415  WBC 9.9 14.3*  HGB 9.6* 9.7*  HCT 28.0* 28.1*  PLT 129* 128*   BMET:   Basename 10/30/11 0428 10/29/11 1745  NA 136 135  K 3.4* 3.7  CL 98 100  CO2 29 25  GLUCOSE 108* 105*  BUN 35* 36*  CREATININE 1.87* 1.85*  CALCIUM 8.3* 7.9*    PT/INR:  Lab Results  Component Value Date   INR 1.38 10/27/2011   INR 1.08 10/26/2011   ABG:  INR: Will add last result for INR, ABG once components are confirmed Will add last 4 CBG results once components are confirmed  Assessment/Plan:  1. CV - Being paced this am (set at 88).On Lopressor 12.5 bid 2.  Pulmonary - Chest tubes with 190 cc of output. Chest tube probably to be removed .CXR this am shows low lung volumes, no ptx, atelectasis at bases L>R.Encourage incentive spirometer and flutter valve.Wean O2 as tolerates. 3. Volume Overload - On Lasix drip.Consider changing to oral. 4.  Acute blood loss anemia - H and H stable at 9.6 and 28.  5.Creatinine 1.87 this am.Monitor 6.Pre op HGA1C 7.8.CBGs 120/85/115 . Will need  to be started on oral medicine once creatinine decreases. Will need follow up as an outpatient. 7.Mild thrombocytopenia-platelets 129,000. 8.Gently supplement potassium 9.Remove sleeve 10.PT to evaluate. 11.Hope to transfer to [facility]U soon.  Timika Muench MPA-C 10/30/2011

## 2011-10-30 NOTE — Progress Notes (Signed)
Patient went into A. Fib/A Flutter while ambulating with PT, HR 120's - 140's, BP stable 100's/50's with MAP above 65.  Dr. Donata Clay was called and orders obtained.  Will continue to monitor.

## 2011-10-30 NOTE — Progress Notes (Signed)
Postop day 3 CABGx4 Postoperative insufficiency now improved with creatinine 1.7 Ready for transfer but developed atrial fib-flutter non-IV amiodarone and still in ICU

## 2011-10-31 ENCOUNTER — Inpatient Hospital Stay (HOSPITAL_COMMUNITY): Payer: Medicare Other

## 2011-10-31 LAB — BASIC METABOLIC PANEL
BUN: 49 mg/dL — ABNORMAL HIGH (ref 6–23)
CO2: 29 mEq/L (ref 19–32)
Calcium: 8.7 mg/dL (ref 8.4–10.5)
Chloride: 97 mEq/L (ref 96–112)
Creatinine, Ser: 2.1 mg/dL — ABNORMAL HIGH (ref 0.50–1.35)
GFR calc Af Amer: 36 mL/min — ABNORMAL LOW (ref >90)
GFR calc non Af Amer: 31 mL/min — ABNORMAL LOW (ref >90)
Glucose, Bld: 124 mg/dL — ABNORMAL HIGH (ref 70–99)
Potassium: 3.7 mEq/L (ref 3.5–5.1)
Sodium: 137 mEq/L (ref 135–145)

## 2011-10-31 LAB — CBC
HCT: 27.7 % — ABNORMAL LOW (ref 39.0–52.0)
Hemoglobin: 9.4 g/dL — ABNORMAL LOW (ref 13.0–17.0)
MCH: 29.4 pg (ref 26.0–34.0)
MCHC: 33.9 g/dL (ref 30.0–36.0)
MCV: 86.6 fL (ref 78.0–100.0)
Platelets: 194 10*3/uL (ref 150–400)
RBC: 3.2 MIL/uL — ABNORMAL LOW (ref 4.22–5.81)
RDW: 13.6 % (ref 11.5–15.5)
WBC: 10.7 10*3/uL — ABNORMAL HIGH (ref 4.0–10.5)

## 2011-10-31 LAB — GLUCOSE, CAPILLARY: Glucose-Capillary: 124 mg/dL — ABNORMAL HIGH (ref 70–99)

## 2011-10-31 MED ORDER — AMIODARONE HCL IN DEXTROSE 360-4.14 MG/200ML-% IV SOLN
30.0000 mg/h | INTRAVENOUS | Status: DC
  Administered 2011-10-31 – 2011-11-01 (×4): 30 mg/h via INTRAVENOUS
  Filled 2011-10-31 (×11): qty 200

## 2011-10-31 MED ORDER — METOCLOPRAMIDE HCL 5 MG/ML IJ SOLN
10.0000 mg | Freq: Four times a day (QID) | INTRAMUSCULAR | Status: AC
  Administered 2011-10-31 – 2011-11-02 (×7): 10 mg via INTRAVENOUS
  Filled 2011-10-31 (×8): qty 2

## 2011-10-31 MED ORDER — PRAMIPEXOLE DIHYDROCHLORIDE 0.25 MG PO TABS
0.7500 mg | ORAL_TABLET | ORAL | Status: DC
  Administered 2011-10-31 – 2011-11-01 (×2): 0.75 mg via ORAL
  Filled 2011-10-31 (×3): qty 3

## 2011-10-31 MED ORDER — METOPROLOL TARTRATE 25 MG PO TABS
25.0000 mg | ORAL_TABLET | Freq: Two times a day (BID) | ORAL | Status: DC
  Administered 2011-10-31 – 2011-11-02 (×6): 25 mg via ORAL
  Filled 2011-10-31 (×8): qty 1

## 2011-10-31 MED ORDER — BOOST / RESOURCE BREEZE PO LIQD
1.0000 | Freq: Every day | ORAL | Status: DC
  Administered 2011-10-31 – 2011-11-02 (×3): 1 via ORAL

## 2011-10-31 MED ORDER — SODIUM CHLORIDE 0.9 % IJ SOLN: 10.0000 mL | INTRAMUSCULAR | Status: DC | PRN

## 2011-10-31 NOTE — Progress Notes (Signed)
Feels better this afternoon  BP 119/80  Pulse 102  Temp 97.5 F (36.4 C) (Axillary)  Resp 18  Ht 5\' 9"  (1.753 m)  Wt 263 lb 0.1 oz (119.3 kg)  BMI 38.84 kg/m2  SpO2 93%   Intake/Output Summary (Last 24 hours) at 10/31/11 1840 Last data filed at 10/31/11 1800  Gross per 24 hour  Intake    972 ml  Output    600 ml  Net    372 ml    + flatus, no BM yet  Continue present care

## 2011-10-31 NOTE — Progress Notes (Signed)
INITIAL ADULT NUTRITION ASSESSMENT Date: 10/31/2011   Time: 11:02 AM  INTERVENTION:  Resource Breeze supplement daily (250 kcals, 9 gm protein per 8 fl oz carton) RD to follow for nutrition care plan  Reason for Assessment: poor PO intake  ASSESSMENT: Male 66 y.o.  Dx: Coronary artery disease  Hx:  Past Medical History  Diagnosis Date  . Hypertension   . Dyslipidemia   . Deviated nasal septum   . GERD (gastroesophageal reflux disease)   . Diabetes mellitus   . Coronary artery disease     Related Meds:     . acetaminophen  1,000 mg Oral Q6H  . amiodarone  400 mg Oral Once  . aspirin EC  325 mg Oral Daily  . atorvastatin  80 mg Oral q1800  . bisacodyl  10 mg Oral Daily   Or  . bisacodyl  10 mg Rectal Daily  . bisacodyl  10 mg Rectal Once  . docusate sodium  200 mg Oral Daily  . furosemide  40 mg Oral Daily  . insulin aspart  0-24 Units Subcutaneous TID AC & HS  . insulin glargine  14 Units Subcutaneous BH-q7a  . metoCLOPramide (REGLAN) injection  10 mg Intravenous Q6H  . metoprolol tartrate  25 mg Oral BID  . moving right along book   Does not apply Once  . pantoprazole  40 mg Oral Q1200  . potassium chloride  20 mEq Oral Daily  . pramipexole  0.75 mg Oral BID  . sodium chloride  3 mL Intravenous Q12H  . DISCONTD: atorvastatin  80 mg Oral q1800  . DISCONTD: metoprolol tartrate  12.5 mg Oral BID  . DISCONTD: pantoprazole  40 mg Oral QAC breakfast  . DISCONTD: sodium chloride  3 mL Intravenous Q12H    Ht: 5\' 9"  (175.3 cm)  Wt: 263 lb 0.1 oz (119.3 kg)  Ideal Wt: 73 kg % Ideal Wt: 163%  Usual Wt: 117 kg % Usual Wt: 102%  Body mass index is 38.84 kg/(m^2).  Food/Nutrition Related Hx: no triggers per admission nutrition screen  Labs:  CMP     Component Value Date/Time   NA 136 10/30/2011 0428   K 3.4* 10/30/2011 0428   CL 98 10/30/2011 0428   CO2 29 10/30/2011 0428   GLUCOSE 108* 10/30/2011 0428   BUN 35* 10/30/2011 0428   CREATININE 1.87* 10/30/2011  0428   CALCIUM 8.3* 10/30/2011 0428   PROT 6.0 10/30/2011 0428   ALBUMIN 2.9* 10/30/2011 0428   AST 39* 10/30/2011 0428   ALT 24 10/30/2011 0428   ALKPHOS 51 10/30/2011 0428   BILITOT 0.5 10/30/2011 0428   GFRNONAA 36* 10/30/2011 0428   GFRAA 42* 10/30/2011 0428     Intake/Output Summary (Last 24 hours) at 10/31/11 1103 Last data filed at 10/31/11 0900  Gross per 24 hour  Intake 1224.9 ml  Output   1275 ml  Net  -50.1 ml    CBG (last 3)   Basename 10/31/11 0741 10/30/11 2107 10/30/11 1546  GLUCAP 124* 104* 124*    Diet Order: Carb Control  Supplements/Tube Feeding: N/A  IVF:    amiodarone (NEXTERONE PREMIX) 360 mg/200 mL dextrose Last Rate: 30 mg/hr (10/30/11 1900)  amiodarone (NEXTERONE PREMIX) 360 mg/200 mL dextrose Last Rate: 30.06 mg/hr (10/31/11 0900)  DISCONTD: amiodarone (NEXTERONE PREMIX) 360 mg/200 mL dextrose     Estimated Nutritional Needs:   Kcal: 2100-2300 Protein: 100-110 gm Fluid: 2.1-2.3 L  Patient admitted by cardiology for coronary artery disease; over the  last 6 months has been having shortness of breath with exertion; s/p CABG 9/9; reports a poor appetite with ongoing nausea; had "spitted up" during RD visitation; offered [employer] (clear liquid supplement); he doesn't particularly like sweet tastes but amenable to trying at this time -- RD to order daily.  NUTRITION DIAGNOSIS: -Inadequate oral intake (NI-2.1).  Status: Ongoing  RELATED TO: poor appetite & nausea  AS EVIDENCE BY: PO intake 0-30%  MONITORING/EVALUATION(Goals): Goal: Oral intake with meals & supplements to meet >/= 90% of estimated nutrition needs Monitor: PO & supplemental intake, weight, labs, I/O's  EDUCATION NEEDS: -Education needs addressed -- DM diet education completed 9/6  Dietitian #: [phone]  DOCUMENTATION CODES Per approved criteria  -Obesity Unspecified    Alger Memos 10/31/2011, 11:02 AM

## 2011-10-31 NOTE — Op Note (Signed)
James [name], James [name] NO.:  [id number]  MEDICAL RECORD NO.:  [id number]  LOCATION:  2311                         FACILITY:  [facility]  PHYSICIAN:  Kerin Perna, M.D.  DATE OF BIRTH:  03-Jul-1945  DATE OF PROCEDURE:  10/27/2011 DATE OF DISCHARGE:                              OPERATIVE REPORT   OPERATION: 1. Coronary artery bypass grafting x4 (left internal mammary artery to     LAD, saphenous vein graft to posterior descending, saphenous vein     graft to diagonal, saphenous vein graft to obtuse marginal). 2. Endoscopic harvest of right leg greater saphenous vein.  SURGEON:  Kerin Perna, M.D.  ASSISTANT:  Doree Fudge, PA-C  ANESTHESIA:  General.  INDICATIONS:  The patient is a obese 66 year old male, who presented with unstable angina.  Cardiac catheterization by Dr. Mayford Knife demonstrated severe 3-vessel coronary disease felt to be best treated with surgical coronary revascularization.  I examined the patient in his CCU room on several occasions and discussed the results of cardiac catheterization with the patient and family.  I discussed the indications and expected benefits of coronary artery bypass grafting for treatment of his coronary artery disease.  I reviewed the details of surgery and the expected recovery.  I reviewed the potential risks to him of the operation including risks of stroke, MI, bleeding, blood transfusion requirement, infection, and death.  After reviewing these issues, he demonstrated his understanding and agreed to proceed with surgery under what I felt was an informed consent.  OPERATIVE FINDINGS: 1. Obese body habitus making exposure to the heart difficult. 2. Normal global LV function by transesophageal echo. 3. Good quality conduit. 4. Severe diffusely diseased targets.  PROCEDURE:  The patient was brought to the operating room and placed supine on the operating table.  General anesthesia was induced under invasive  hemodynamic monitoring.  The chest, abdomen, and legs were prepped with Betadine and draped as a sterile field.  A sternal incision was made as the saphenous vein was harvested endoscopically from the leg.  The left internal mammary artery was harvested as a pedicle graft from its origin to the subclavian vessels.  This was a good vessel with excellent flow.  The sternal retractor was placed using the deep blades.  The pericardium was opened and suspended.  Pursestrings were placed in the ascending aorta and right atrium and after the vein was harvested, the patient was heparinized and the ACT was documented as being therapeutic.  The patient was cannulated and placed on cardiopulmonary bypass.  The coronary arteries were identified for grafting.  Cardioplegic catheters were placed for both antegrade and retrograde cold blood cardioplegia. The patient was cooled to 32 degrees.  The vein and mammary artery were prepared for the distal anastomoses and the patient was cooled to 32 degrees.  The aortic crossclamp was applied.  One liter of cold blood cardioplegia was delivered in split doses between the antegrade aortic and retrograde coronary sinus catheters.  There was good cardioplegic arrest and septal temperature dropped less than 14 degrees. Cardioplegia was delivered every 30 minutes or less.  The distal coronary anastomoses were then performed.  The first distal anastomosis  was the posterior descending branch of the right coronary. This was occluded proximally.  A reverse saphenous vein was sewn end-to- side with running 7-0 Prolene with good flow through the graft.  The second distal anastomosis was to the optional diagonal of the LAD.  This was a 1.5-mm vessel with a high-grade proximal 90% stenosis.  A reverse saphenous vein was sewn end-to-side with running 7-0 Prolene with good flow through the graft.  This vessel was intramyocardial.  The third distal anastomosis was to the  circumflex marginal.  This was also intramyocardial and difficult to expose.  It had a proximal high-grade stenosis.  A reverse saphenous vein was sewn end-to-side with running 7- 0 Prolene with good flow through the graft.  Cardioplegia was redosed. The fourth distal anastomosis was to the distal LAD.  It had a high- grade proximal stenosis with diffuse disease.  The left IMA pedicle was brought through an opening created in the left lateral pericardium, was brought down onto the LAD and sewn end-to-side with running 8-0 Prolene. There was good flow through the anastomosis and the pedicle bulldog was reapplied and the pedicle secured to the epicardium with 6-0 Prolene. Cardioplegia was redosed.  While the crossclamp was still in place, 3 proximal vein anastomoses were performed on the ascending aorta using a 4.0 mm punch running 7-0 Prolene.  Air was vented from the coronaries with a dose of retrograde warm blood cardioplegia prior to removing the crossclamp.  The heart was cardioverted back to a regular rhythm.  The bypass grafts were opened.  Each had good flow.  Hemostasis was documented to the proximal distal anastomoses.  The patient was rewarmed and reperfused. Temporary pacing wires were applied.  The lungs were expanded.  The ventilator was resumed.  The patient was then weaned off bypass without difficulty on no inotropes.  Transesophageal echo showed excellent LV function.  Protamine was administered without adverse reaction.  The cannulas were removed.  The mediastinum was irrigated with warm saline. The superior pericardial fat was closed.  The anterior mediastinal and a left pleural chest tube were placed and brought out through separate incisions.  The sternum was closed with interrupted steel wire.  The pectoralis fascia was closed with a running #1 Vicryl.  The subcutaneous and skin layers were closed with running Vicryl and sterile dressings were applied.  Total  cardiopulmonary bypass time was 145 minutes.     Kerin Perna, M.D.     PV/MEDQ  D:  10/30/2011  T:  10/31/2011  Job:  [id number]

## 2011-10-31 NOTE — Progress Notes (Signed)
Amiodarone drip order somehow had dropped off the [facility], as per previous order patient has been receiving amiodarone IV, order corrected in EPIC system and documented appropriately, however the newly placed order does not start until 0100 this am as I cannot "back date or time" the new order.  All doses and volumes are correct in I&O section of EPIC DOCUMENTATION.

## 2011-10-31 NOTE — Progress Notes (Signed)
Physical Therapy Treatment Patient Details Name: James [name] MRN: [id number] DOB: [birth year] Today's Date: 10/31/2011 Time: 1315-1330 PT Time Calculation (min): 15 min  PT Assessment / Plan / Recommendation Comments on Treatment Session  Pt adm for CABG.  Doing well with mobility.  Expect he will continue to progress and reach I before dc home.    Follow Up Recommendations  No PT follow up    Barriers to Discharge        Equipment Recommendations  None recommended by PT    Recommendations for Other Services    Frequency Min 3X/week   Plan Discharge plan remains appropriate;Frequency remains appropriate    Precautions / Restrictions Precautions Precautions: Sternal Precaution Comments: Gave sternal precaution handout   Pertinent Vitals/Pain HR to 130 with amb but quickly returned to <100    Mobility  Transfers Sit to Stand: 5: Supervision;Without upper extremity assist;From chair/3-in-1 Stand to Sit: 5: Supervision;Without upper extremity assist;To chair/3-in-1 Details for Transfer Assistance: Pt reaches for w/c with hands to prevent pushing from chair. Ambulation/Gait Ambulation/Gait Assistance: 5: Supervision Ambulation Distance (Feet): 350 Feet Assistive device: Other (Comment) (pushing w/c ) Ambulation/Gait Assistance Details: Pt required 3 standing rest breaks.  Pt uses w/c to push equipment primarily not for support. Gait Pattern: Step-through pattern;Decreased stride length Gait velocity: decr    Exercises     PT Diagnosis:    PT Problem List:   PT Treatment Interventions:     PT Goals Acute Rehab PT Goals PT Goal: Sit to Stand - Progress: Progressing toward goal PT Goal: Stand to Sit - Progress: Progressing toward goal PT Goal: Ambulate - Progress: Progressing toward goal  Visit Information  Last PT Received On: 10/31/11 Assistance Needed: +1    Subjective Data  Subjective: Pt states he is having a little nausea.   Cognition  Overall Cognitive  Status: Appears within functional limits for tasks assessed/performed Arousal/Alertness: Awake/alert Orientation Level: Appears intact for tasks assessed Behavior During Session: [facility] for tasks performed    Balance  Static Standing Balance Static Standing - Balance Support: Bilateral upper extremity supported Static Standing - Level of Assistance: 5: Stand by assistance  End of Session PT - End of Session Equipment Utilized During Treatment: Other (comment) (no gait belt due to lines/tubes) Activity Tolerance: Patient tolerated treatment well Patient left: in chair;with call bell/phone within reach;with family/visitor present   GP     [facility] 10/31/2011, 1:45 PM  [employer] PT [phone]

## 2011-10-31 NOTE — Progress Notes (Addendum)
TCTS DAILY PROGRESS NOTE                   [address]            [place] [address]          [phone]      4 Days Post-Op Procedure(s) (LRB): CORONARY ARTERY BYPASS GRAFTING (CABG) (N/A)  Total Length of Stay:  LOS: 8 days   Subjective: Patient with not much appetite, occasional nausea but no emesis. Not really passing flatus and no bowel movement yet.  Objective: Vital signs in last 24 hours: Temp:  [98.2 F (36.8 C)-99 F (37.2 C)] 98.4 F (36.9 C) (09/13 0400) Pulse Rate:  [57-145] 99  (09/13 0500) Cardiac Rhythm:  [-] Atrial paced (09/12 1942) Resp:  [13-30] 21  (09/13 0700) BP: (87-132)/(27-76) 112/56 mmHg (09/13 0700) SpO2:  [72 %-100 %] 97 % (09/13 0500) Weight:  [263 lb 0.1 oz (119.3 kg)] 263 lb 0.1 oz (119.3 kg) (09/13 0500)  Filed Weights   10/29/11 0600 10/30/11 0600 10/31/11 0500  Weight: 270 lb 15.1 oz (122.9 kg) 263 lb 0.1 oz (119.3 kg) 263 lb 0.1 oz (119.3 kg)    Weight change: 0 lb (0 kg)      Intake/Output from previous day: 09/12 0701 - 09/13 0700 In: 1719.5 [P.O.:720; I.V.:841.5; IV Piggyback:158] Out: 1500 [Urine:1500]  Intake/Output this shift:    Current Meds: Scheduled Meds:   . acetaminophen  1,000 mg Oral Q6H  . amiodarone  400 mg Oral Once  . aspirin EC  325 mg Oral Daily  . atorvastatin  80 mg Oral q1800  . bisacodyl  10 mg Oral Daily   Or  . bisacodyl  10 mg Rectal Daily  . bisacodyl  10 mg Rectal Once  . docusate sodium  200 mg Oral Daily  . furosemide  40 mg Intravenous Once  . furosemide  40 mg Oral Daily  . insulin aspart  0-24 Units Subcutaneous TID AC & HS  . insulin glargine  14 Units Subcutaneous BH-q7a  . metoCLOPramide (REGLAN) injection  10 mg Intravenous Q6H  . metoprolol tartrate  12.5 mg Oral BID  . moving right along book   Does not apply Once  . pantoprazole  40 mg Oral Q1200  . potassium chloride  20 mEq Oral Daily  . pramipexole  0.75 mg Oral BID  . sodium chloride  3 mL  Intravenous Q12H   Continuous Infusions:   . amiodarone (NEXTERONE PREMIX) 360 mg/200 mL dextrose 30 mg/hr (10/30/11 1900)  . amiodarone (NEXTERONE PREMIX) 360 mg/200 mL dextrose 30.06 mg/hr (10/31/11 0700)   PRN Meds:.sodium chloride, metoprolol, morphine injection, ondansetron (ZOFRAN) IV, oxyCODONE, potassium chloride, sodium chloride, traMADol, DISCONTD: midazolam, DISCONTD: oxyCODONE, DISCONTD: sodium chloride  General appearance: alert and cooperative Neurologic: intact Heart: irregularly irregular rhythm Lungs: diminished breath sounds LLL Abdomen: abnormal findings:  distended and hypoactive bowel sounds Extremities: SCDs in place Wound: Clean and dry. No erythema or drainage  Lab Results: CBC: Basename 10/30/11 0428 10/29/11 0415  WBC 9.9 14.3*  HGB 9.6* 9.7*  HCT 28.0* 28.1*  PLT 129* 128*   BMET:  Basename 10/30/11 0428 10/29/11 1745  NA 136 135  K 3.4* 3.7  CL 98 100  CO2 29 25  GLUCOSE 108* 105*  BUN 35* 36*  CREATININE 1.87* 1.85*  CALCIUM 8.3* 7.9*    PT/INR: No results found for this basename: LABPROT,INR in the last 72 hours Radiology: Dg Chest [facility]  1 View  10/30/2011  *RADIOLOGY REPORT*  Clinical Data: Status post CABG, follow  PORTABLE CHEST - 1 VIEW  Comparison: Portable chest x-ray of 10/29/2011  Findings: A left chest tube remains and no pneumothorax is seen. Postop opacity at the left lung base remains.  The right lung is clear.  Cardiomegaly stable.  A venous sheath remains in the right internal jugular vein.  IMPRESSION: Left chest tube remains.  No pneumothorax.   Original Report Authenticated By: Juline Patch, M.D.      Assessment/Plan: S/P Procedure(s) (LRB): CORONARY ARTERY BYPASS GRAFTING (CABG) (N/A) 1.CV-went into afib/aflutter yesterday. On Amiodarone gttp. Will continue as remains in afib this am. Will increase Lopressor to 25 bid. 2. Pulmonary - Chest tube probably to be removed .CXR this am shows low lung volumes, no ptx, atelectasis  at bases L>R.Encourage incentive spirometer and flutter valve.Wean O2 as tolerates.  3. Volume Overload - On Lasix  40 mg daily 4. Acute blood loss anemia - H and H stable at 9.6 and 28.  5.Last creatinine 1.87.Monitor  6.Pre op HGA1C 7.8.CBGs 139/124/104. Will need to be started on oral medicine once creatinine decreases. Will need follow up as an outpatient.  7.Mild thrombocytopenia-platelets 129,000.  8.GI-will order KUB.LOC for constipation.May need to decrease diet or make NPO. Monitor. 9.Will have PICC placed as unable to obtain peripheral IV and need labs drawn.  Doree Fudge PA-C 10/31/2011 7:42 AM   Patient seen and examined. He's in rate controlled A flutter- attempted to rapid a pace- unsuccessful in converting Still has abdominal distention c/w ileus, will add reglan

## 2011-11-01 ENCOUNTER — Inpatient Hospital Stay (HOSPITAL_COMMUNITY): Payer: Medicare Other

## 2011-11-01 LAB — BASIC METABOLIC PANEL
BUN: 49 mg/dL — ABNORMAL HIGH (ref 6–23)
Calcium: 8.6 mg/dL (ref 8.4–10.5)
GFR calc non Af Amer: 35 mL/min — ABNORMAL LOW (ref >90)
Glucose, Bld: 131 mg/dL — ABNORMAL HIGH (ref 70–99)
Sodium: 135 mEq/L (ref 135–145)

## 2011-11-01 LAB — CBC
HCT: 26.1 % — ABNORMAL LOW (ref 39.0–52.0)
Hemoglobin: 8.8 g/dL — ABNORMAL LOW (ref 13.0–17.0)
MCH: 29.2 pg (ref 26.0–34.0)
MCHC: 33.7 g/dL (ref 30.0–36.0)

## 2011-11-01 LAB — GLUCOSE, CAPILLARY
Glucose-Capillary: 110 mg/dL — ABNORMAL HIGH (ref 70–99)
Glucose-Capillary: 141 mg/dL — ABNORMAL HIGH (ref 70–99)

## 2011-11-01 MED ORDER — MAGNESIUM HYDROXIDE 400 MG/5ML PO SUSP: 30.0000 mL | Freq: Every day | ORAL | Status: DC | PRN

## 2011-11-01 MED ORDER — ZOLPIDEM TARTRATE 5 MG PO TABS: 5.0000 mg | ORAL_TABLET | Freq: Every evening | ORAL | Status: DC | PRN

## 2011-11-01 MED ORDER — PRAMIPEXOLE DIHYDROCHLORIDE 0.25 MG PO TABS: 0.7500 mg | ORAL_TABLET | Freq: Three times a day (TID) | ORAL | Status: DC

## 2011-11-01 MED ORDER — PRAMIPEXOLE DIHYDROCHLORIDE 0.25 MG PO TABS
0.7500 mg | ORAL_TABLET | Freq: Three times a day (TID) | ORAL | Status: DC
  Administered 2011-11-01 – 2011-11-03 (×7): 0.75 mg via ORAL
  Filled 2011-11-01 (×8): qty 3

## 2011-11-01 MED ORDER — AMIODARONE HCL 200 MG PO TABS
400.0000 mg | ORAL_TABLET | Freq: Two times a day (BID) | ORAL | Status: DC
  Administered 2011-11-01 – 2011-11-03 (×5): 400 mg via ORAL
  Filled 2011-11-01 (×6): qty 2

## 2011-11-01 MED ORDER — BISACODYL 10 MG RE SUPP
10.0000 mg | Freq: Once | RECTAL | Status: AC
  Administered 2011-11-01: 10 mg via RECTAL

## 2011-11-01 MED ORDER — ALUM & MAG HYDROXIDE-SIMETH 200-200-20 MG/5ML PO SUSP: 15.0000 mL | ORAL | Status: DC | PRN

## 2011-11-01 MED ORDER — SODIUM CHLORIDE 0.9 % IJ SOLN
3.0000 mL | Freq: Two times a day (BID) | INTRAMUSCULAR | Status: DC
  Administered 2011-11-01: 10 mL via INTRAVENOUS

## 2011-11-01 MED ORDER — GUAIFENESIN-DM 100-10 MG/5ML PO SYRP: 15.0000 mL | ORAL_SOLUTION | ORAL | Status: DC | PRN

## 2011-11-01 MED ORDER — MOVING RIGHT ALONG BOOK
Freq: Once | Status: AC
  Administered 2011-11-03: 07:00:00
  Filled 2011-11-01: qty 1

## 2011-11-01 MED ORDER — SODIUM CHLORIDE 0.9 % IJ SOLN: 3.0000 mL | INTRAMUSCULAR | Status: DC | PRN

## 2011-11-01 MED ORDER — SODIUM CHLORIDE 0.9 % IV SOLN: 250.0000 mL | INTRAVENOUS | Status: DC | PRN

## 2011-11-01 NOTE — Progress Notes (Signed)
Transferred to 2016 via wheelchair with monitor. Report given to Fayrene Fearing, RN on 2000. Tolerated well. Park and walk completed

## 2011-11-01 NOTE — Plan of Care (Signed)
Problem: Phase III Progression Outcomes Goal: Transfer to PCTU/Telemetry POD Outcome: Completed/Met Date Met:  11/01/11 11/01/11

## 2011-11-01 NOTE — Progress Notes (Signed)
Subjective:  AFIB/FLUTTER on tele. Currently 95bpm. No symptoms. Wants to have BM.  Objective:  Vital Signs in the last 24 hours: Temp:  [97.5 F (36.4 C)-98.6 F (37 C)] 98.4 F (36.9 C) (09/14 0744) Pulse Rate:  [25-130] 105  (09/14 1053) Resp:  [15-28] 22  (09/14 0900) BP: (90-138)/(45-109) 113/75 mmHg (09/14 1053) SpO2:  [91 %-100 %] 91 % (09/14 0900) Weight:  [120.1 kg (264 lb 12.4 oz)] 120.1 kg (264 lb 12.4 oz) (09/14 0500)  Intake/Output from previous day: 09/13 0701 - 09/14 0700 In: 1334.1 [P.O.:480; I.V.:844.1; IV Piggyback:10] Out: 600 [Urine:600]   Physical Exam: General: Well developed, well nourished, in no acute distress. Head:  Normocephalic and atraumatic. Lungs: Clear to auscultation and percussion. Heart: Irreg, irreg. Soft S murmur, rubs or gallops. Well healing scar Abdomen: soft, non-tender, positive bowel sounds. Overweight Extremities: No clubbing or cyanosis. No edema. Neurologic: Alert and oriented x 3.    Lab Results:  Basename 11/01/11 0420 10/31/11 1120  WBC 11.7* 10.7*  HGB 8.8* 9.4*  PLT 192 194    Basename 11/01/11 0420 10/31/11 1120  NA 135 137  K 3.6 3.7  CL 96 97  CO2 29 29  GLUCOSE 131* 124*  BUN 49* 49*  CREATININE 1.89* 2.10*   No results found for this basename: TROPONINI:2,CK,MB:2 in the last 72 hours Hepatic Function Panel  Basename 10/30/11 0428  PROT 6.0  ALBUMIN 2.9*  AST 39*  ALT 24  ALKPHOS 51  BILITOT 0.5  BILIDIR --  IBILI --   Telemetry: Flutter/FIB 100-110 Personally viewed.    Assessment/Plan:  Principal Problem:  *Coronary artery disease Active Problems:  Hypertension  Hyperlipidemia  DM (diabetes mellitus), type 2, uncontrolled with complications  GERD (gastroesophageal reflux disease)  AFLUTTER/FIB  - agree with amio  - good rate control  - metop 25 bid  - coumadin tomorrow if no conversion  Will follow. Agree with plan.     SKAINS, MARK 11/01/2011, 11:51 AM

## 2011-11-01 NOTE — Progress Notes (Addendum)
5 Days Post-Op Procedure(s) (LRB): CORONARY ARTERY BYPASS GRAFTING (CABG) (N/A) Subjective: Feels better + flatus, no BM  Objective: Vital signs in last 24 hours: Temp:  [97.5 F (36.4 C)-98.6 F (37 C)] 98.4 F (36.9 C) (09/14 0744) Pulse Rate:  [25-130] 106  (09/14 0900) Cardiac Rhythm:  [-] Atrial flutter (09/14 0800) Resp:  [15-28] 22  (09/14 0900) BP: (90-138)/(45-109) 132/91 mmHg (09/14 0900) SpO2:  [91 %-100 %] 91 % (09/14 0900) Weight:  [264 lb 12.4 oz (120.1 kg)] 264 lb 12.4 oz (120.1 kg) (09/14 0500)  Hemodynamic parameters for last 24 hours:    Intake/Output from previous day: 09/13 0701 - 09/14 0700 In: 1334.1 [P.O.:480; I.V.:844.1; IV Piggyback:10] Out: 600 [Urine:600] Intake/Output this shift: Total I/O In: 513.4 [P.O.:440; I.V.:73.4] Out: 350 [Urine:350]  General appearance: alert and no distress Neurologic: intact Heart: slightly irregular Lungs: diminished breath sounds both bases, left > right Abdomen: distended, nontender, hypoactive BS Wound: clean and dry  Lab Results:  Basename 11/01/11 0420 10/31/11 1120  WBC 11.7* 10.7*  HGB 8.8* 9.4*  HCT 26.1* 27.7*  PLT 192 194   BMET:  Basename 11/01/11 0420 10/31/11 1120  NA 135 137  K 3.6 3.7  CL 96 97  CO2 29 29  GLUCOSE 131* 124*  BUN 49* 49*  CREATININE 1.89* 2.10*  CALCIUM 8.6 8.7    PT/INR: No results found for this basename: LABPROT,INR in the last 72 hours ABG    Component Value Date/Time   PHART 7.309* 10/27/2011 2223   HCO3 22.7 10/27/2011 2223   TCO2 21 10/28/2011 1723   ACIDBASEDEF 3.0* 10/27/2011 2223   O2SAT 97.0 10/27/2011 2223   CBG (last 3)   Basename 11/01/11 0743 10/31/11 2239 10/31/11 1955  GLUCAP 110* 102* 154*    Assessment/Plan: S/P Procedure(s) (LRB): CORONARY ARTERY BYPASS GRAFTING (CABG) (N/A) Plan for transfer to step-down: see transfer orders CV- remains in rate controlled a flutter- continue lopressor, change amiodarone to PO Will need to consider coumadin  tomorrow if flutter persists   RESP- left pleural effusion and left lower lobe atelectasis- pulmonary hygiene, PA/lat CXR in AM  RENAL- creatinine remains elevated, but down slightly from yesterday  ILEUS- appears to be slowly resolving- advance diet  Continue ambulation     LOS: 9 days    James [name] C 11/01/2011

## 2011-11-02 ENCOUNTER — Inpatient Hospital Stay (HOSPITAL_COMMUNITY): Payer: Medicare Other

## 2011-11-02 LAB — BASIC METABOLIC PANEL
CO2: 26 mEq/L (ref 19–32)
GFR calc non Af Amer: 42 mL/min — ABNORMAL LOW (ref >90)
Glucose, Bld: 123 mg/dL — ABNORMAL HIGH (ref 70–99)
Potassium: 4.1 mEq/L (ref 3.5–5.1)
Sodium: 135 mEq/L (ref 135–145)

## 2011-11-02 LAB — GLUCOSE, CAPILLARY
Glucose-Capillary: 124 mg/dL — ABNORMAL HIGH (ref 70–99)
Glucose-Capillary: 127 mg/dL — ABNORMAL HIGH (ref 70–99)
Glucose-Capillary: 155 mg/dL — ABNORMAL HIGH (ref 70–99)

## 2011-11-02 LAB — CBC
Hemoglobin: 9.1 g/dL — ABNORMAL LOW (ref 13.0–17.0)
MCHC: 34.1 g/dL (ref 30.0–36.0)
RBC: 3.08 MIL/uL — ABNORMAL LOW (ref 4.22–5.81)
WBC: 13.6 10*3/uL — ABNORMAL HIGH (ref 4.0–10.5)

## 2011-11-02 MED ORDER — WARFARIN SODIUM 5 MG PO TABS
5.0000 mg | ORAL_TABLET | Freq: Every day | ORAL | Status: DC
  Administered 2011-11-02 – 2011-11-03 (×2): 5 mg via ORAL
  Filled 2011-11-02 (×2): qty 1

## 2011-11-02 MED ORDER — WARFARIN - PHYSICIAN DOSING INPATIENT
Freq: Every day | Status: DC
  Administered 2011-11-02 – 2011-11-03 (×2)

## 2011-11-02 MED ORDER — SODIUM CHLORIDE 0.9 % IJ SOLN
10.0000 mL | INTRAMUSCULAR | Status: DC | PRN
  Administered 2011-11-02 (×4): 10 mL

## 2011-11-02 MED ORDER — SODIUM CHLORIDE 0.9 % IJ SOLN: 10.0000 mL | Freq: Two times a day (BID) | INTRAMUSCULAR | Status: DC

## 2011-11-02 NOTE — Progress Notes (Addendum)
[address]            [place] [address]          [phone]     6 Days Post-Op  Procedure(s) (LRB): CORONARY ARTERY BYPASS GRAFTING (CABG) (N/A) Subjective: Moderate amt of nausea  Objective  Telemetry aflutter, int low 100's   Temp:  [98.5 F (36.9 C)-99.5 F (37.5 C)] 99.1 F (37.3 C) (09/15 0438) Pulse Rate:  [45-111] 95  (09/15 0438) Resp:  [18-27] 20  (09/15 0438) BP: (97-138)/(58-93) 97/67 mmHg (09/15 0438) SpO2:  [93 %-99 %] 95 % (09/15 0438) Weight:  [268 lb 8 oz (121.791 kg)] 268 lb 8 oz (121.791 kg) (09/15 0137)   Intake/Output Summary (Last 24 hours) at 11/02/11 0916 Last data filed at 11/01/11 1500  Gross per 24 hour  Intake 1002.2 ml  Output    400 ml  Net  602.2 ml       General appearance: alert, cooperative and no distress Heart: irregularly irregular rhythm Lungs: minor basilar crackles Abdomen: + BS, obese, nontender Extremities: pitting BLE edema Wound: incisions healing well  Lab Results:  Basename 11/01/11 0420 10/31/11 1120  NA 135 137  K 3.6 3.7  CL 96 97  CO2 29 29  GLUCOSE 131* 124*  BUN 49* 49*  CREATININE 1.89* 2.10*  CALCIUM 8.6 8.7  MG -- --  PHOS -- --   No results found for this basename: AST:2,ALT:2,ALKPHOS:2,BILITOT:2,PROT:2,ALBUMIN:2 in the last 72 hours No results found for this basename: LIPASE:2,AMYLASE:2 in the last 72 hours  Basename 11/02/11 0820 11/01/11 0420  WBC 13.6* 11.7*  NEUTROABS -- --  HGB 9.1* 8.8*  HCT 26.7* 26.1*  MCV 86.7 86.7  PLT 259 192   No results found for this basename: CKTOTAL:4,CKMB:4,TROPONINI:4 in the last 72 hours No components found with this basename: POCBNP:3 No results found for this basename: DDIMER in the last 72 hours No results found for this basename: HGBA1C in the last 72 hours No results found for this basename: CHOL,HDL,LDLCALC,TRIG,CHOLHDL in the last 72 hours No results found for this basename:  TSH,T4TOTAL,FREET3,T3FREE,THYROIDAB in the last 72 hours No results found for this basename: VITAMINB12,FOLATE,FERRITIN,TIBC,IRON,RETICCTPCT in the last 72 hours  Medications: Scheduled    . acetaminophen  1,000 mg Oral Q6H  . amiodarone  400 mg Oral BID  . aspirin EC  325 mg Oral Daily  . atorvastatin  80 mg Oral q1800  . bisacodyl  10 mg Oral Daily   Or  . bisacodyl  10 mg Rectal Daily  . bisacodyl  10 mg Rectal Once  . docusate sodium  200 mg Oral Daily  . feeding supplement  1 Container Oral Q1500  . furosemide  40 mg Oral Daily  . insulin aspart  0-24 Units Subcutaneous TID AC & HS  . insulin glargine  14 Units Subcutaneous BH-q7a  . metoCLOPramide (REGLAN) injection  10 mg Intravenous Q6H  . metoprolol tartrate  25 mg Oral BID  . moving right along book   Does not apply Once  . pantoprazole  40 mg Oral Q1200  . potassium chloride  20 mEq Oral Daily  . pramipexole  0.75 mg Oral TID  . sodium chloride  10-40 mL Intracatheter Q12H  . sodium chloride  3 mL Intravenous Q12H  . DISCONTD: pramipexole  0.75 mg Oral Custom  . DISCONTD: pramipexole  0.75 mg Oral TID  .  DISCONTD: sodium chloride  3 mL Intravenous Q12H     Radiology/Studies:  Dg Chest Port 1 View  11/01/2011  *RADIOLOGY REPORT*  Clinical Data: Postop cardiac surgery.  PORTABLE CHEST - 1 VIEW  Comparison: 10/31/2011.  Findings: 0617 hours.  Right arm PICC is unchanged at the SVC right atrial level.  The heart size and mediastinal contours are stable status post CABG.  There is stable left perihilar and basilar air space disease.  The right lung is clear.  No pneumothorax or significant pleural effusion is demonstrated.  IMPRESSION: Stable postoperative chest.   Original Report Authenticated By: Gerrianne Scale, M.D.    Dg Chest Port 1 View  10/31/2011  *RADIOLOGY REPORT*  Clinical Data: Bedside PICC placement.  PORTABLE CHEST - 1 VIEW 10/31/2011 2136 hours:  Comparison: Portable chest x-ray earlier same date 0634  hours.  Findings: Right arm PICC tip overlies the lower SVC.  Prior sternotomy for CABG.  Cardiac silhouette enlarged but stable. Stable consolidation in the left lung base and in the left upper lobe.  Right lung essentially clear.  Pulmonary vascularity normal.  IMPRESSION:  1.  Right arm PICC tip overlies the lower SVC. 2.  Stable atelectasis and/or pneumonia in the left lung.   Original Report Authenticated By: Arnell Sieving, M.D.     INR: Will add last result for INR, ABG once components are confirmed Will add last 4 CBG results once components are confirmed  Assessment/Plan: S/P Procedure(s) (LRB): CORONARY ARTERY BYPASS GRAFTING (CABG) (N/A)  1. Poss start coumadin with persistent aflutter, amio may be causing nausea, monitor closely 2. cbg's with adeq control 3. H/H stable 4. aeration on CXR somewhat improved on left, cont pulm toilet/rehab 5. Volume overload, may need to consider increased lasix dosing, recheck BUN/Creat in am 6.  had large BM yesterday, ileus resolving  LOS: 10 days    GOLD,WAYNE E 9/15/20139:16 AM    Patient seen and examined. Agree with above Creatinine better today Remains in a flutter- rate controlled with meds. IMO we should go ahead and anticoagulate with coumadin. D/w patient, he is in agreement

## 2011-11-02 NOTE — Progress Notes (Signed)
Atrial flutter noted with variable conduction with heart rate mostly  90-100.  He feels well, no specific complaints.  Hemoglobin 9.1, creatinine 1.63 down from 2.1.  General: Alert and oriented x3 Cardiovascular: Irregularly irregular rhythm, chest wall scar healing. Extremities: 1+ edema.  Assessment and plan:  1. Atrial flutter-agree with initiation of warfarin. Amiodarone may in fact be causing some of his nausea. We'll continue to monitor. Hopefully he will convert on amiodarone. Overall however, he is well rate controlled. Continue with metoprolol 25 mg twice a day.  2. Volume overload-continuing with furosemide.

## 2011-11-03 LAB — BASIC METABOLIC PANEL
BUN: 48 mg/dL — ABNORMAL HIGH (ref 6–23)
Chloride: 95 mEq/L — ABNORMAL LOW (ref 96–112)
Creatinine, Ser: 1.45 mg/dL — ABNORMAL HIGH (ref 0.50–1.35)
GFR calc Af Amer: 56 mL/min — ABNORMAL LOW (ref >90)

## 2011-11-03 LAB — GLUCOSE, CAPILLARY
Glucose-Capillary: 116 mg/dL — ABNORMAL HIGH (ref 70–99)
Glucose-Capillary: 131 mg/dL — ABNORMAL HIGH (ref 70–99)

## 2011-11-03 MED ORDER — WARFARIN SODIUM 5 MG PO TABS: 5.0000 mg | ORAL_TABLET | Freq: Every day | ORAL | Status: DC

## 2011-11-03 MED ORDER — GLIPIZIDE 5 MG PO TABS
5.0000 mg | ORAL_TABLET | Freq: Every day | ORAL | Status: DC
  Filled 2011-11-03: qty 1

## 2011-11-03 MED ORDER — OXYCODONE HCL 5 MG PO TABS: 5.0000 mg | ORAL_TABLET | ORAL | Status: DC | PRN

## 2011-11-03 MED ORDER — AMIODARONE HCL 200 MG PO TABS: 400.0000 mg | ORAL_TABLET | Freq: Two times a day (BID) | ORAL | Status: DC

## 2011-11-03 MED ORDER — METOPROLOL TARTRATE 25 MG PO TABS: 25.0000 mg | ORAL_TABLET | Freq: Three times a day (TID) | ORAL | Status: DC

## 2011-11-03 MED ORDER — GLIPIZIDE 5 MG PO TABS: 5.0000 mg | ORAL_TABLET | Freq: Every day | ORAL | Status: DC

## 2011-11-03 MED ORDER — POTASSIUM CHLORIDE CRYS ER 20 MEQ PO TBCR: 20.0000 meq | EXTENDED_RELEASE_TABLET | Freq: Every day | ORAL | Status: DC

## 2011-11-03 MED ORDER — METOPROLOL TARTRATE 25 MG PO TABS
25.0000 mg | ORAL_TABLET | Freq: Three times a day (TID) | ORAL | Status: DC
  Administered 2011-11-03 (×2): 25 mg via ORAL
  Filled 2011-11-03 (×3): qty 1

## 2011-11-03 MED ORDER — FUROSEMIDE 40 MG PO TABS: 40.0000 mg | ORAL_TABLET | Freq: Every day | ORAL | Status: DC

## 2011-11-03 NOTE — Progress Notes (Signed)
Physical Therapy Treatment Patient Details Name: James [name] MRN: [id number] DOB: 01/16/1946 Today's Date: 11/03/2011 Time: 2952-8413 PT Time Calculation (min): 31 min  PT Assessment / Plan / Recommendation Comments on Treatment Session  Pt has met most acute PT goals and should be appropriate for discharge home when cleared by MD.       Follow Up Recommendations  No PT follow up    Barriers to Discharge        Equipment Recommendations  None recommended by PT    Recommendations for Other Services    Frequency Min 3X/week   Plan Discharge plan remains appropriate;Frequency remains appropriate    Precautions / Restrictions Precautions Precautions: Sternal Restrictions Weight Bearing Restrictions: No    Pertinent Vitals/Pain No chest pain.  SpO2 >90 throughout session. HR <115 while ambulating    Mobility  Bed Mobility Bed Mobility: Not assessed (pt oob independently per nursing. ) Transfers Transfers: Sit to Stand;Stand to Sit Sit to Stand: 7: Independent Stand to Sit: 7: Independent Details for Transfer Assistance: Pt demonstating safety and independence with adherence to sternal precautions.  Ambulation/Gait Ambulation/Gait Assistance: 6: Modified independent (Device/Increase time) Ambulation Distance (Feet): 300 Feet Assistive device: Rolling walker;None Ambulation/Gait Assistance Details: No assistive device in hallway for first 150 feet. Pt given walker secondary to increase RR and pt reaching for guard rails on wll more often.  Gait Pattern: Step-through pattern;Decreased stride length Gait velocity: WFL General Gait Details: Pt reports felling safer and being able to walk farther with RW. 2 standing rest breaks secondary to SOB.  SpO2 >90 throughout session on room air.  Stairs: Yes Stairs Assistance: 7: Independent Stair Management Technique: One rail Right;Forwards Number of Stairs: 3  Wheelchair Mobility Wheelchair Mobility: No    Exercises     PT  Diagnosis:    PT Problem List:   PT Treatment Interventions:     PT Goals Acute Rehab PT Goals PT Goal Formulation: With patient Time For Goal Achievement: 11/06/11 Potential to Achieve Goals: Good Pt will go Supine/Side to Sit: with modified independence Pt will go Sit to Supine/Side: with modified independence Pt will go Sit to Stand: with modified independence PT Goal: Sit to Stand - Progress: Met Pt will go Stand to Sit: with modified independence PT Goal: Stand to Sit - Progress: Met Pt will Ambulate: >150 feet;with modified independence;with least restrictive assistive device PT Goal: Ambulate - Progress: Met  Visit Information  Last PT Received On: 11/03/11 Assistance Needed: +1    Subjective Data  Subjective: I am ready to go home Patient Stated Goal: Return to prior level of function   Cognition  Overall Cognitive Status: Appears within functional limits for tasks assessed/performed Arousal/Alertness: Awake/alert Orientation Level: Appears intact for tasks assessed Behavior During Session: [facility] for tasks performed    Balance  Balance Balance Assessed: No  End of Session PT - End of Session Equipment Utilized During Treatment: Other (comment) (no gait belt due to lines/tubes) Activity Tolerance: Patient tolerated treatment well Patient left: in chair;with call bell/phone within reach;with family/visitor present Nurse Communication: Mobility status   GP     Jeannett Dekoning 11/03/2011, 1:07 PM Kynlee Koenigsberg L. Clary Meeker DPT [phone]

## 2011-11-03 NOTE — Discharge Summary (Signed)
patient examined and medical record reviewed,agree with above note. VAN TRIGT III,PETER 11/03/2011

## 2011-11-03 NOTE — Progress Notes (Signed)
                    [address]            [place] [address]          [phone]     7 Days Post-Op Procedure(s) (LRB): CORONARY ARTERY BYPASS GRAFTING (CABG) (N/A)  Subjective: Nausea resolved.  +BM.  Feels well, wants to go home.   Objective: Vital signs in last 24 hours: Patient Vitals for the past 24 hrs:  BP Temp Temp src Pulse Resp SpO2 Weight  11/03/11 1041 132/76 mmHg - - 87  - - -  11/03/11 0447 119/61 mmHg 98.4 F (36.9 C) Oral 92  21  95 % 263 lb 12.8 oz (119.659 kg)  11/02/11 2110 119/66 mmHg 98.9 F (37.2 C) Oral 100  19  96 % -  11/02/11 1423 146/73 mmHg 98.4 F (36.9 C) Oral 90  20  94 % -   Current Weight  11/03/11 263 lb 12.8 oz (119.659 kg)  PRE OP WEIGHT: 117 kg    Intake/Output from previous day: 09/15 0701 - 09/16 0700 In: 480 [P.O.:480] Out: 1050 [Urine:1050]  CBGs 120-171-116-115   PHYSICAL EXAM:  Heart: RRR, aflutter on tele Lungs: slightly decreased BS in bases Wound: clean and dry Extremities: 1+ LE edema    Lab Results: CBC: Basename 11/02/11 0820 11/01/11 0420  WBC 13.6* 11.7*  HGB 9.1* 8.8*  HCT 26.7* 26.1*  PLT 259 192   BMET:  Basename 11/03/11 0500 11/02/11 0820  NA 133* 135  K 4.0 4.1  CL 95* 97  CO2 28 26  GLUCOSE 171* 123*  BUN 48* 49*  CREATININE 1.45* 1.63*  CALCIUM 8.8 8.7    PT/INR:  Basename 11/03/11 0500  LABPROT 15.8*  INR 1.23      Assessment/Plan: S/P Procedure(s) (LRB): CORONARY ARTERY BYPASS GRAFTING (CABG) (N/A)  CV- remains in atrial flutter.  Lopressor dose increased for better rate control.  Coumadin started yesterday.  Continue to monitor.  For OP cardioversion in 4 weeks.  DM- sugars generally stable. A1C=7.8.  Will d/c Lantus and restart po Glipizide.  Vol overload- continue diuresis.  Acute renal insufficiency- Cr trending down. Continue to watch.  WBC up slightly this am.  Follow up labs.  CRPI, pulm toilet.  GI- nausea resolved, pt feeling better  overall.  Monitor.  Will d/c EPWs and watch.  Hopefully home in am if remains stable.     LOS: 11 days    COLLINS,GINA H 11/03/2011

## 2011-11-03 NOTE — Progress Notes (Signed)
D/C CTS per protocol and as ordered, pt tolerated well, steris applied.  Will continue to monitor. James [name]

## 2011-11-03 NOTE — Progress Notes (Signed)
D/C EPW per protocol and as ordered, no ectopy noted, all ends intact. Bleeding noted from ventricle wire site, will continue to monitor site. Pt tolerated well. INR 1.23, VSS.  Pt reminded to lie supine approximately one hour.  Will continue to monitor.  James [name]

## 2011-11-03 NOTE — Progress Notes (Signed)
SUBJECTIVE:  Doing well - wants to go home  OBJECTIVE:   Vitals:   Filed Vitals:   11/02/11 0438 11/02/11 1423 11/02/11 2110 11/03/11 0447  BP: 97/67 146/73 119/66 119/61  Pulse: 95 90 100 92  Temp: 99.1 F (37.3 C) 98.4 F (36.9 C) 98.9 F (37.2 C) 98.4 F (36.9 C)  TempSrc:  Oral Oral Oral  Resp: 20 20 19 21   Height:      Weight:    119.659 kg (263 lb 12.8 oz)  SpO2: 95% 94% 96% 95%   I&O's:   Intake/Output Summary (Last 24 hours) at 11/03/11 4696 Last data filed at 11/03/11 2952  Gross per 24 hour  Intake    240 ml  Output   1050 ml  Net   -810 ml   TELEMETRY: Reviewed telemetry pt in atrial flutter at 90-100bpm    PHYSICAL EXAM General: Well developed, well nourished, in no acute distress Head: Eyes PERRLA, No xanthomas.   Normal cephalic and atramatic  Lungs:   Clear bilaterally to auscultation and percussion. Heart:   HRRR S1 S2 Pulses are 2+ & equal. Abdomen: Bowel sounds are positive, abdomen soft and non-tender without masses  Extremities:   No clubbing, cyanosis or edema.  DP +1 Neuro: Alert and oriented X 3. Psych:  Good affect, responds appropriately   LABS: Basic Metabolic Panel:  Basename 11/02/11 0820 11/01/11 0420  NA 135 135  K 4.1 3.6  CL 97 96  CO2 26 29  GLUCOSE 123* 131*  BUN 49* 49*  CREATININE 1.63* 1.89*  CALCIUM 8.7 8.6  MG -- --  PHOS -- --   Liver Function Tests: No results found for this basename: AST:2,ALT:2,ALKPHOS:2,BILITOT:2,PROT:2,ALBUMIN:2 in the last 72 hours No results found for this basename: LIPASE:2,AMYLASE:2 in the last 72 hours CBC:  Basename 11/02/11 0820 11/01/11 0420  WBC 13.6* 11.7*  NEUTROABS -- --  HGB 9.1* 8.8*  HCT 26.7* 26.1*  MCV 86.7 86.7  PLT 259 192   Coag Panel:   Lab Results  Component Value Date   INR 1.38 10/27/2011   INR 1.08 10/26/2011    RADIOLOGY: Dg Chest 2 View  11/02/2011  *RADIOLOGY REPORT*  Clinical Data: Left pleural effusion.  CHEST - 2 VIEW  Comparison: November 01, 2011.   Findings: Sternotomy wires are noted.  Mild cardiomegaly is noted. Right-sided PICC line is unchanged in position.  Right lung is clear.  No pneumothorax is noted.  Left perihilar and basilar opacity is not significantly changed compared to prior exam.  IMPRESSION: No significant change compared to prior exam.  No pneumothorax is noted.   Original Report Authenticated By: Venita Sheffield., M.D.    Dg Chest 2 View  10/26/2011  *RADIOLOGY REPORT*  Clinical Data: 66 year old male preoperative study for CABG. Hypertension and coronary artery disease.  CHEST - 2 VIEW  Comparison: 10/13/2011.  Findings: Stable mild cardiomegaly.  Other mediastinal contours are within normal limits.  Lung volumes are within normal limits.  No pneumothorax, pulmonary edema, pleural effusion or confluent pulmonary opacity. No acute osseous abnormality identified.  IMPRESSION: No acute cardiopulmonary abnormality.   Original Report Authenticated By: Harley Hallmark, M.D.    Dg Chest Port 1 View  11/01/2011  *RADIOLOGY REPORT*  Clinical Data: Postop cardiac surgery.  PORTABLE CHEST - 1 VIEW  Comparison: 10/31/2011.  Findings: 0617 hours.  Right arm PICC is unchanged at the SVC right atrial level.  The heart size and mediastinal contours are stable status post  CABG.  There is stable left perihilar and basilar air space disease.  The right lung is clear.  No pneumothorax or significant pleural effusion is demonstrated.  IMPRESSION: Stable postoperative chest.   Original Report Authenticated By: Gerrianne Scale, M.D.    Dg Chest Port 1 View  10/31/2011  *RADIOLOGY REPORT*  Clinical Data: Bedside PICC placement.  PORTABLE CHEST - 1 VIEW 10/31/2011 2136 hours:  Comparison: Portable chest x-ray earlier same date 0634 hours.  Findings: Right arm PICC tip overlies the lower SVC.  Prior sternotomy for CABG.  Cardiac silhouette enlarged but stable. Stable consolidation in the left lung base and in the left upper lobe.  Right lung essentially  clear.  Pulmonary vascularity normal.  IMPRESSION:  1.  Right arm PICC tip overlies the lower SVC. 2.  Stable atelectasis and/or pneumonia in the left lung.   Original Report Authenticated By: Arnell Sieving, M.D.    Dg Chest Port 1 View  10/31/2011  *RADIOLOGY REPORT*  Clinical Data: CABG, shortness of breath  PORTABLE CHEST - 1 VIEW  Comparison: 10/30/2011; -12/2011; 10/28/2011; 10/27/2011  Findings:  Grossly unchanged large cardiac silhouette and mediastinal contours post mediastinotomy and CABG.  Interval removal of right jugular approach vascular sheath and left-sided chest tube.  No definite pneumothorax.  The pulmonary vasculature remains indistinct with cephalization of flow.  Small bilateral effusions are suspected. Grossly unchanged bibasilar heterogeneous opacities, left greater than right.  Left mid lung heterogeneous opacities are favored to represent contusion as sequela of prior chest tube placement. Unchanged bones.  IMPRESSION: 1.  Interval removal of support apparatus without definite pneumothorax. 2.  Grossly unchanged mild pulmonary edema, small bilateral effusions, and bibasilar opacities, left greater than right, favored to represent atelectasis.   Original Report Authenticated By: Waynard Reeds, M.D.    Dg Chest Port 1 View  10/30/2011  *RADIOLOGY REPORT*  Clinical Data: Status post CABG, follow  PORTABLE CHEST - 1 VIEW  Comparison: Portable chest x-ray of 10/29/2011  Findings: A left chest tube remains and no pneumothorax is seen. Postop opacity at the left lung base remains.  The right lung is clear.  Cardiomegaly stable.  A venous sheath remains in the right internal jugular vein.  IMPRESSION: Left chest tube remains.  No pneumothorax.   Original Report Authenticated By: Juline Patch, M.D.    Dg Chest Portable 1 View In Am  10/29/2011  *RADIOLOGY REPORT*  Clinical Data: Postop cardiac surgery.  PORTABLE CHEST - 1 VIEW  Comparison: 10/28/2011 and 10/13/2011.  Findings: Right  IJ catheter sheath remains in place.  Swan-Ganz catheter has been removed.  Mediastinal drain, left chest tube and epicardial pacer wires remain in place.  Six intact sternotomy wires, stable in position.  Cardiomediastinal silhouette is enlarged, as before.  Lungs are low in volume with left perihilar and left lower lobe atelectasis.  No definite pneumothorax. Possible trace bilateral pleural effusions.  IMPRESSION:  1.  Left perihilar and left basilar atelectasis. 2.  No definite pneumothorax with left chest tube in place. 3.  Possible trace bilateral pleural effusions.   Original Report Authenticated By: Reyes Ivan, M.D.    Dg Chest Portable 1 View In Am  10/28/2011  *RADIOLOGY REPORT*  Clinical Data: Postoperative study following CABG.  PORTABLE CHEST - 1 VIEW  Comparison: Chest x-ray 10/27/2011.  Findings: Endotracheal and nasogastric tubes have been removed. Swan-Ganz catheter has been slightly repositioned, tip of which is now in the proximal right main pulmonary artery.  Mediastinal / pericardial drains remain in position.  Left-sided chest tube is unchanged.  The lung volumes remain low.  There is pulmonary venous congestion without frank pulmonary edema.  Focal linear opacities in the left mid lung and bibasilar areas are favored to represent resolving postoperative atelectasis. Probable trace left pleural effusion.  No appreciable pneumothorax.  Widening of cardiomediastinal contours is unchanged.  IMPRESSION: 1.  Postoperative changes and support apparatus, as above. 2.  Persistent low lung volumes with scattered areas of subsegmental atelectasis in the left mid lung and bibasilar regions. 3.  Trace left pleural effusion is unchanged.   Original Report Authenticated By: Florencia Reasons, M.D.    Dg Chest Portable 1 View  10/27/2011  *RADIOLOGY REPORT*  Clinical Data: Open heart surgery, postop  PORTABLE CHEST - 1 VIEW  Comparison: Chest x-ray of 10/26/2011  Findings: The carina is difficult  to visualize, but the tip of endotracheal tube is at least 3.2 cm above the carina.  A left chest tube is present and no pneumothorax is seen. The lungs are not optimally aerated with areas of atelectasis present.  A Swan- Ganz catheter is noted with the tip in the right main pulmonary artery.  NG tube is present with the tip extending below the hemidiaphragm.  IMPRESSION:  1.  Endotracheal tip at least 3.2 cm above the carina. 2.  Left chest tube present.  No pneumothorax.   Original Report Authenticated By: Juline Patch, M.D.    Dg Abd Portable 1v  10/31/2011  *RADIOLOGY REPORT*  Clinical Data: Abdominal distension.  PORTABLE ABDOMEN - 1 VIEW  Comparison: None.  Findings: 0856 hours.  The bowel gas pattern appears nonobstructive.  There is gas throughout the small and large bowel most consistent with an ileus.  There is no supine evidence of free intraperitoneal air.  There are apparent epicardial pacer leads.  IMPRESSION: Mild gaseous distension of the small and large bowel most consistent with a mild ileus.   Original Report Authenticated By: Gerrianne Scale, M.D.       ASSESSMENT:  Principal Problem:  *Coronary artery disease  Active Problems:  Hypertension  Hyperlipidemia  DM (diabetes mellitus), type 2, uncontrolled with complications  GERD (gastroesophageal reflux disease)   AFLUTTER/FIB  - continue  amio  - Increase metoprolol to 25mg  TID for better rate control (HR in the upper 90's to 100bpm at rest) - Followup in my office in 1 week - Will plan outpatient cardioversion once he has been therapeutic on Coumadin for 4 week. - Followup in our coumadin clinic on Wed 9/18     Quintella Reichert, MD  11/03/2011  8:32 AM

## 2011-11-03 NOTE — Discharge Summary (Addendum)
Physician Discharge Summary  Patient ID: James [name] MRN: [id number] DOB/AGE: 1945-11-26 66 y.o.  Admit date: 10/23/2011 Discharge date: 11/03/2011  Admission Diagnoses: 1.Multivessel CAD 2.History of hypertension 3. History of dyslipidemia 4. Newly diagnosed DM  Discharge Diagnoses:  1.Multivessel CAD 2.History of hypertension 3. History of dyslipidemia 4. Newly diagnosed DM 5. Post op aflutter 6.ABL anemia 7.Post op ileus   Procedure (s):  1.Cardiac Catheterization done by Dr. Mayford Knife on 10/23/2011: ANGIOGRAPHIC DATA: The left main coronary artery is short and appears patent until the distal portion where there appears to be disease with pressure damping upon catheter engaging the vessel. The left anterior descending artery has a 99% stenosis at the takeoff of the first diagonal. The ongoing LAD is patent. The first diagonal is patent. The LAD give rise to a second diagonal which is large with an 80% ostial stenosis. The diagonal then trifurcates into 3 daughter vessels. The left circumflex artery is widely patent and gives rise to a small first OM which is patent. The left circumflex then gives rise to a large OM2 which is widely patent and trifurcates into 3 daughter vessels all of which are patent. The right coronary artery has an ostial 90% stenosis and then gives rise to 2 moderate sized acute RV marginal branches which are patent. The ongoing RCA has a long 80% stenosis in the distal vessel and then bifurcates into a PDA and PL branches which are patent.  LEFT VENTRICULOGRAM: Left ventricular angiogram was done in the 30 RAO projection and revealed normal left ventricular wall motion and systolic function with an estimated ejection fraction of 50%. LVEDP was 14 mmHg  2. Coronary artery bypass grafting x4 (left internal mammary artery to LAD, saphenous vein graft to posterior descending, saphenous vein graft to diagonal, saphenous vein graft to obtuse marginal).  with endoscopic  harvest of right leg greater saphenous vein by Dr. Dr. Donata Clay on 10/27/2011   History of Presenting Illness: This is a 66 year old Caucasian male who has had recent progression of exertional shortness of breath and upper chest tightness. The symptoms occur while walking up hills, doing treadmill workout at his gym, and are relieved by rest. He has had no resting symptoms of pain, orthopnea, PND,. He denies syncope. He denies family history of CAD. He has never smoked. He is recently been found to be diabetic with a hemoglobin A1c of 7.7. No history of other vascular disease, TIA, DVT.  Cardiac catheterization performed 10/23/2011 by Dr. Mayford Knife demonstrated a  high-grade 95% proximal LAD stenosis, moderate proximal circumflex disease, high-grade stenosis of the second diagonal, high-grade diffuse disease of the right coronary artery. Ventriculogram and a 2-D echo show fairly well preserved LV function without significant valvular disease. LVEDP 14 mm mercury. Due to the three-vessel disease and his accelerating symptoms, surgical coronary bypass grafting was recommended. The patient was  stable on IV heparin. A cardiothoracic consultation was obtained with Dr. Donata Clay for the consideration of coronary artery bypass grafting surgery. Pre operative carotid duplex US showed no significant internal carotid artery stenosis bilaterally. Potential risks, benefits, and complications were discussed with the patient and he agreed to proceed. He underwent a CABG x 4 on 10/27/2011.  Brief Hospital Course:  Patient was extubated without difficulty later the evening of surgery.His Theone Murdoch, a line, chest tubes, and foley were all removed early in his post operative course.He was initially AV paced. He was weaned off Dopamine and Neo synephrine. He was then started on  low dose Lopressor. He was found to have ABL anemia. His H and H went as low as 8.8 and 26.1. He did not require a post op transfusion. He was volume  overloaded and diuresed accordingly. He was weaned off his insulin drip and started on Glipizide. His glucose remained well controlled. He will need follow up as an outpatient as he is a newly diagnosed diabetic and his pre op HGA1C was 7.8. His creatinine was elevated post op, but was down to 1.45 9/16.He did have abdominal distention and constipation as well. It thought he had an ileus, which did resolve.He went into aflutter with RVR. He was put on an Amiodarone gttp. He did not convert to SR. He was placed on po Amiodarone as his rate was controlled.He was also placed on Coumadin and his PT and INR were monitored daily. He was felt surgically stable for transfer from the ICU to [facility] for further convalescence on 11/01/2011.He has been tolerating a diet and has had a bowel movement. Epicardial pacing wires and chest tube sutures will be removed prior to his discharge. Pr Dr. Mayford Knife, he will be cardioverted as an outpatient. Provided he remains afebrile, hemodynamically stable, and pending morning round evaluation, he will be surgically stable for discharge on 11/03/2011.  Latest Vital Signs: Blood pressure 123/77, pulse 96, temperature 98.8 F (37.1 C), temperature source Oral, resp. rate 20, height 5\' 9"  (1.753 m), weight 263 lb 12.8 oz (119.659 kg), SpO2 93.00%.  Physical Exam: Heart: RRR, aflutter on tele  Lungs: slightly decreased BS in bases  Wound: clean and dry  Extremities: 1+ LE edema  Discharge Condition:Stable  Recent laboratory studies:  Lab Results  Component Value Date   WBC 13.6* 11/02/2011   HGB 9.1* 11/02/2011   HCT 26.7* 11/02/2011   MCV 86.7 11/02/2011   PLT 259 11/02/2011   Lab Results  Component Value Date   NA 133* 11/03/2011   K 4.0 11/03/2011   CL 95* 11/03/2011   CO2 28 11/03/2011   CREATININE 1.45* 11/03/2011   GLUCOSE 171* 11/03/2011      Diagnostic Studies: Dg Chest 2 View  11/02/2011  *RADIOLOGY REPORT*  Clinical Data: Left pleural effusion.  CHEST - 2 VIEW   Comparison: November 01, 2011.  Findings: Sternotomy wires are noted.  Mild cardiomegaly is noted. Right-sided PICC line is unchanged in position.  Right lung is clear.  No pneumothorax is noted.  Left perihilar and basilar opacity is not significantly changed compared to prior exam.  IMPRESSION: No significant change compared to prior exam.  No pneumothorax is noted.   Original Report Authenticated By: Maryagnes Amos JR. M.D.    Dg Abd Portable 1v  10/31/2011  *RADIOLOGY REPORT*  Clinical Data: Abdominal distension.  PORTABLE ABDOMEN - 1 VIEW  Comparison: None.  Findings: 0856 hours.  The bowel gas pattern appears nonobstructive.  There is gas throughout the small and large bowel most consistent with an ileus.  There is no supine evidence of free intraperitoneal air.  There are apparent epicardial pacer leads.  IMPRESSION: Mild gaseous distension of the small and large bowel most consistent with a mild ileus.   Original Report Authenticated By: Gerrianne Scale, M.D.    Discharge Orders    Future Appointments: Provider: Department: Dept Phone: Center:   11/26/2011 11:30 AM Kerin Perna, MD [facility] [phone] [facility]     Future Orders Please Complete By Expires   Ambulatory referral to Nutrition and Diabetic Education  Comments:   A1C=7.8 new diagnosis of DM   Amb Referral to Cardiac Rehabilitation         Discharge Medications:   Medication List     As of 11/03/2011  3:49 PM    STOP taking these medications         metoprolol succinate 50 MG 24 hr tablet   Commonly known as: TOPROL-XL      olmesartan-hydrochlorothiazide 40-25 MG per tablet   Commonly known as: BENICAR HCT      TAKE these medications         amiodarone 200 MG tablet   Commonly known as: PACERONE   Take 2 tablets (400 mg total) by mouth 2 (two) times daily. For 7 days, then decrease to 1 tab (200mg ) by mouth twice daily      aspirin 325 MG EC tablet   Take 325 mg by mouth daily.      fish  oil-omega-3 fatty acids 1000 MG capsule   Take 2 g by mouth 2 (two) times daily.      furosemide 40 MG tablet   Commonly known as: LASIX   Take 1 tablet (40 mg total) by mouth daily. For 5 days      glipiZIDE 5 MG tablet   Commonly known as: GLUCOTROL   Take 1 tablet (5 mg total) by mouth daily before breakfast.      metoprolol tartrate 25 MG tablet   Commonly known as: LOPRESSOR   Take 1 tablet (25 mg total) by mouth 3 (three) times daily.      multivitamin with minerals Tabs   Take 1 tablet by mouth daily.      omeprazole 20 MG capsule   Commonly known as: PRILOSEC   Take 20 mg by mouth daily as needed. For acid reflux      oxyCODONE 5 MG immediate release tablet   Commonly known as: Oxy IR/ROXICODONE   Take 1-2 tablets (5-10 mg total) by mouth every 4 (four) hours as needed.      potassium chloride SA 20 MEQ tablet   Commonly known as: K-DUR,KLOR-CON   Take 1 tablet (20 mEq total) by mouth daily. For 5 days      pramipexole 0.75 MG tablet   Commonly known as: MIRAPEX   Take 0.75 mg by mouth 2 (two) times daily.      rosuvastatin 40 MG tablet   Commonly known as: CRESTOR   Take 40 mg by mouth daily.      warfarin 5 MG tablet   Commonly known as: COUMADIN   Take 1 tablet (5 mg total) by mouth daily.        Follow Up Appointments:     Follow-up Information    Follow up with Quintella Reichert, MD. On 11/10/2011. (at 10am)    Contact information:   [address] [place] [address] [phone]       Follow up with Quintella Reichert, MD. On 11/05/2011. (Coumadin clinic at 9:45am)    Contact information:   [address] [place] [address] [phone]       Follow up with Mikey Bussing, MD. On 11/26/2011. (Appointment is at [facility])    Contact information:   [address] [place] [address] [phone]       Follow up with [facility]. On 11/26/2011. (Please get chest xray performed at 10:30)    Contact  information:   [address] [place]  [place] [address]          Signed: Doree Fudge MPA-C 11/03/2011, 3:49 PM

## 2011-11-03 NOTE — Progress Notes (Signed)
[id number] Cardiac Rehab Pt ambulated with PT this am. Completed discharge education with pt. He agrees to [facility]. CRP in [place], will send referral.

## 2011-11-03 NOTE — Progress Notes (Signed)
Pt d/c with instructions, r/x, and f/u appointments, pt verbalized understanding, pt home with family.

## 2011-11-10 ENCOUNTER — Other Ambulatory Visit: Payer: Self-pay | Admitting: Cardiothoracic Surgery

## 2011-11-10 DIAGNOSIS — I2510 Atherosclerotic heart disease of native coronary artery without angina pectoris: Secondary | ICD-10-CM

## 2011-11-10 NOTE — Discharge Summary (Signed)
patient examined and medical record reviewed,agree with above note. VAN TRIGT III,PETER 11/10/2011    

## 2011-11-12 ENCOUNTER — Encounter: Payer: Self-pay | Admitting: Cardiothoracic Surgery

## 2011-11-12 ENCOUNTER — Ambulatory Visit
Admission: RE | Admit: 2011-11-12 | Discharge: 2011-11-12 | Disposition: A | Discharge status: Home / Self Care | Payer: Medicare Other | Source: Ambulatory Visit | Attending: Cardiothoracic Surgery | Admitting: Cardiothoracic Surgery

## 2011-11-12 ENCOUNTER — Ambulatory Visit (INDEPENDENT_AMBULATORY_CARE_PROVIDER_SITE_OTHER): Payer: Self-pay | Admitting: Cardiothoracic Surgery

## 2011-11-12 VITALS — BP 134/84 | HR 80 | Resp 24 | Ht 69.0 in | Wt 265.0 lb

## 2011-11-12 DIAGNOSIS — Z09 Encounter for follow-up examination after completed treatment for conditions other than malignant neoplasm: Secondary | ICD-10-CM

## 2011-11-12 DIAGNOSIS — Z951 Presence of aortocoronary bypass graft: Secondary | ICD-10-CM

## 2011-11-12 DIAGNOSIS — I2510 Atherosclerotic heart disease of native coronary artery without angina pectoris: Secondary | ICD-10-CM

## 2011-11-12 NOTE — Progress Notes (Signed)
PCP is Lolita Patella, MD Referring Provider is Quintella Reichert, MD  Chief Complaint  Patient presents with  . Routine Post Op    1 wk with cxr...s/p CABG 10/27/11    HPI: Doing well status post CABG x4 for severe three-vessel disease with unstable angina. Problems with obesity, fluid retention, drainage from leg incision and from chest tube sites. Currently taking Lasix. Postop atrial fib-flutter on Coumadin and amiodarone for possible outpatient cardioversion after month. Preparing to go to [employer] for his daughter's wedding this weekend. No angina. Sternal incision healing.  Past Medical History  Diagnosis Date  . Hypertension   . Dyslipidemia   . Deviated nasal septum   . GERD (gastroesophageal reflux disease)   . Diabetes mellitus   . Coronary artery disease     Past Surgical History  Procedure Date  . Left knee arthroscopy 2003  . Right knee arthroscopy 2006  . Cardiac catheterization   . Coronary artery bypass graft 10/27/2011    Procedure: CORONARY ARTERY BYPASS GRAFTING (CABG);  Surgeon: Kerin Perna, MD;  Location: [facility] OR;  Service: Open Heart Surgery;  Laterality: N/A;  Coronary Artery Bypass Grafting times four using left internal mammary artery and right greater saphenous vein endoscopically harvested    Family History  Problem Relation Age of Onset  . Stroke Father     Social History History  Substance Use Topics  . Smoking status: Never Smoker   . Smokeless tobacco: Not on file  . Alcohol Use: Yes    Current Outpatient Prescriptions  Medication Sig Dispense Refill  . amiodarone (PACERONE) 200 MG tablet Take 2 tablets (400 mg total) by mouth 2 (two) times daily. For 7 days, then decrease to 1 tab (200mg ) by mouth twice daily  60 tablet  1  . aspirin 325 MG EC tablet Take 325 mg by mouth daily.      . fish oil-omega-3 fatty acids 1000 MG capsule Take 2 g by mouth 2 (two) times daily.       . furosemide (LASIX) 40 MG tablet Take 40 mg by mouth  daily.      . metoprolol tartrate (LOPRESSOR) 25 MG tablet Take 1 tablet (25 mg total) by mouth 3 (three) times daily.  90 tablet  1  . Multiple Vitamin (MULTIVITAMIN WITH MINERALS) TABS Take 1 tablet by mouth daily.      Marland Kitchen olmesartan (BENICAR) 40 MG tablet Take 40 mg by mouth daily.      Marland Kitchen omeprazole (PRILOSEC) 20 MG capsule Take 20 mg by mouth daily as needed. For acid reflux      . oxyCODONE (OXY IR/ROXICODONE) 5 MG immediate release tablet Take 1-2 tablets (5-10 mg total) by mouth every 4 (four) hours as needed.  30 tablet  0  . pramipexole (MIRAPEX) 0.75 MG tablet Take 0.75 mg by mouth 2 (two) times daily.      . rosuvastatin (CRESTOR) 40 MG tablet Take 40 mg by mouth daily.      Marland Kitchen warfarin (COUMADIN) 5 MG tablet Take 1 tablet (5 mg total) by mouth daily.  30 tablet  1  . DISCONTD: furosemide (LASIX) 40 MG tablet Take 1 tablet (40 mg total) by mouth daily. For 5 days  5 tablet  0    Allergies  Allergen Reactions  . Lisinopril     Review of Systems no fever no angina BP 134/84  Pulse 80  Resp 24  Ht 5\' 9"  (1.753 m)  Wt 265 lb (120.203 kg)  BMI 39.13 kg/m2  SpO2 97% Physical Exam Alert and comfortable Breath sounds clear Irregular heart rhythm consistent with fit-flutter Chest tube sites with some fat necrosis and mild drainage packed with iodoform gauze Right leg vein harvest site at pretibial area draining clear fluid packed with iodoform gauze  Diagnostic Tests: Chest x-ray with mild atelectasis no significant effusion sternal wires intact  Impression: Overall doing well post CABG with fluid retention will add Zaroxolyn 2 Lasix. We'll continue Coumadin and amiodarone. We'll give 7 days of Keflex for mild cellulitis around chest tube sites and right leg vein harvest site.  Plan: Return in 2 weeks for wound check

## 2011-11-13 ENCOUNTER — Ambulatory Visit: Payer: Medicare Other

## 2011-11-13 ENCOUNTER — Ambulatory Visit (INDEPENDENT_AMBULATORY_CARE_PROVIDER_SITE_OTHER): Payer: Self-pay

## 2011-11-13 DIAGNOSIS — Z4802 Encounter for removal of sutures: Secondary | ICD-10-CM

## 2011-11-13 DIAGNOSIS — IMO0001 Reserved for inherently not codable concepts without codable children: Secondary | ICD-10-CM

## 2011-11-13 DIAGNOSIS — I2510 Atherosclerotic heart disease of native coronary artery without angina pectoris: Secondary | ICD-10-CM

## 2011-11-14 NOTE — Progress Notes (Unsigned)
Removed old packing from 2 chest tube sites and repacked sites with 2 inches of iodoform gauze per wound, then covered with 2 x 2 and tape. Removed old packing from pretibial area of EVH site and repacked with 1 inch iodoform gauze, covered with 4 x 4 and tape. Pt tolerated well. Scheduled appt next  Wed for wound check. Pt is currently taking Keflex for 7 days.

## 2011-11-19 ENCOUNTER — Ambulatory Visit (INDEPENDENT_AMBULATORY_CARE_PROVIDER_SITE_OTHER): Payer: Self-pay

## 2011-11-19 DIAGNOSIS — I2510 Atherosclerotic heart disease of native coronary artery without angina pectoris: Secondary | ICD-10-CM

## 2011-11-19 DIAGNOSIS — Z09 Encounter for follow-up examination after completed treatment for conditions other than malignant neoplasm: Secondary | ICD-10-CM

## 2011-11-19 DIAGNOSIS — IMO0001 Reserved for inherently not codable concepts without codable children: Secondary | ICD-10-CM

## 2011-11-19 DIAGNOSIS — IMO0002 Reserved for concepts with insufficient information to code with codable children: Secondary | ICD-10-CM

## 2011-11-26 ENCOUNTER — Ambulatory Visit: Payer: Medicare Other | Admitting: Cardiothoracic Surgery

## 2011-11-26 ENCOUNTER — Ambulatory Visit (INDEPENDENT_AMBULATORY_CARE_PROVIDER_SITE_OTHER): Payer: Self-pay | Admitting: Cardiothoracic Surgery

## 2011-11-26 VITALS — BP 146/100 | HR 110 | Resp 20 | Ht 69.0 in | Wt 264.0 lb

## 2011-11-26 DIAGNOSIS — L03119 Cellulitis of unspecified part of limb: Secondary | ICD-10-CM

## 2011-11-26 DIAGNOSIS — L02419 Cutaneous abscess of limb, unspecified: Secondary | ICD-10-CM

## 2011-11-26 DIAGNOSIS — IMO0001 Reserved for inherently not codable concepts without codable children: Secondary | ICD-10-CM

## 2011-11-26 DIAGNOSIS — Z4800 Encounter for change or removal of nonsurgical wound dressing: Secondary | ICD-10-CM

## 2011-11-26 DIAGNOSIS — Z951 Presence of aortocoronary bypass graft: Secondary | ICD-10-CM

## 2011-11-26 DIAGNOSIS — I2510 Atherosclerotic heart disease of native coronary artery without angina pectoris: Secondary | ICD-10-CM

## 2011-11-26 NOTE — Patient Instructions (Signed)
U. will be coming to the office for wound care, to pack the right leg wound U. will be taking an antibiotic 3 times a day for 10 days for infection of the right lower leg wound

## 2011-11-26 NOTE — Progress Notes (Signed)
PCP is Lolita Patella, MD Referring Provider is Quintella Reichert, MD  Chief Complaint  Patient presents with  . Routine Post Op    2 week f/u wound check, S/P CABG x 4 on 10/27/11    HPI: The patient returns for a wound check after multivessel bypass grafting. He is obese and has cellulitis and a small abscess in the soft tissue of the right leg the Endo vein tunnel site. The office this was cleaned with peroxide and saline and packed with 1/2 inch Nu Gauze wet-to-dry. He has chronic stasis venous changes in his leg. There is some mild cellulitis. We'll also be given a course of oral antibiotic-Keflex 500 3 times a day for 10 days   Past Medical History  Diagnosis Date  . Hypertension   . Dyslipidemia   . Deviated nasal septum   . GERD (gastroesophageal reflux disease)   . Diabetes mellitus   . Coronary artery disease     Past Surgical History  Procedure Date  . Left knee arthroscopy 2003  . Right knee arthroscopy 2006  . Cardiac catheterization   . Coronary artery bypass graft 10/27/2011    Procedure: CORONARY ARTERY BYPASS GRAFTING (CABG);  Surgeon: Kerin Perna, MD;  Location: [facility] OR;  Service: Open Heart Surgery;  Laterality: N/A;  Coronary Artery Bypass Grafting times four using left internal mammary artery and right greater saphenous vein endoscopically harvested    Family History  Problem Relation Age of Onset  . Stroke Father     Social History History  Substance Use Topics  . Smoking status: Never Smoker   . Smokeless tobacco: Not on file  . Alcohol Use: Yes    Current Outpatient Prescriptions  Medication Sig Dispense Refill  . amiodarone (PACERONE) 200 MG tablet Take 2 tablets (400 mg total) by mouth 2 (two) times daily. For 7 days, then decrease to 1 tab (200mg ) by mouth twice daily  60 tablet  1  . aspirin 325 MG EC tablet Take 325 mg by mouth daily.      . cephALEXin (KEFLEX) 500 MG capsule Take 500 mg by mouth 3 (three) times daily.      . fish  oil-omega-3 fatty acids 1000 MG capsule Take 2 g by mouth 2 (two) times daily.       . furosemide (LASIX) 40 MG tablet Take 20 mg by mouth daily.       . metoprolol tartrate (LOPRESSOR) 25 MG tablet Take 1 tablet (25 mg total) by mouth 3 (three) times daily.  90 tablet  1  . Multiple Vitamin (MULTIVITAMIN WITH MINERALS) TABS Take 1 tablet by mouth daily.      Marland Kitchen olmesartan (BENICAR) 40 MG tablet Take 40 mg by mouth daily.      Marland Kitchen omeprazole (PRILOSEC) 20 MG capsule Take 20 mg by mouth daily as needed. For acid reflux      . pramipexole (MIRAPEX) 0.75 MG tablet Take 0.75 mg by mouth 2 (two) times daily.      . rosuvastatin (CRESTOR) 40 MG tablet Take 40 mg by mouth daily.      Marland Kitchen warfarin (COUMADIN) 5 MG tablet Take 1 tablet (5 mg total) by mouth daily.  30 tablet  1    Allergies  Allergen Reactions  . Lisinopril     Review of Systems denies fever, states he is trying to keep his leg elevated,  BP 146/100  Pulse 110  Resp 20  Ht 5\' 9"  (1.753 m)  Wt  264 lb (119.75 kg)  BMI 38.99 kg/m2  SpO2 98% Physical Exam Lungs clear Sternum well-healed Cardiac rhythm rapid no murmur Right leg with edema, mild cellulitis and the Endo vein harvest site open which was packed with 1/2 inch Nu Gauze.  Diagnostic Tests: Rhythm strip shows him to be in atrial flutter heart rate 120, pending cardioversion by Dr. Mayford Knife. He is on Coumadin regulated by the equal pharmacist.  Impression: Right leg soft tissue infection, cellulitis with abscess packed and placed on oral antibiotics. He will be monitored daily in the office dressing changes. Return in one day.   Plan: Return to  office for wound care. Keep leg elevated. One week course of antibiotics. Lopressor increased to 50 mg by mouth twice a day. He'll continue amiodarone 200 mg twice a day-he was only taking it once a day. Continue Coumadin per [facility] cardiology pharmacist.

## 2011-11-27 ENCOUNTER — Ambulatory Visit (INDEPENDENT_AMBULATORY_CARE_PROVIDER_SITE_OTHER): Payer: Medicare Other

## 2011-11-27 DIAGNOSIS — I2510 Atherosclerotic heart disease of native coronary artery without angina pectoris: Secondary | ICD-10-CM

## 2011-11-27 DIAGNOSIS — L02419 Cutaneous abscess of limb, unspecified: Secondary | ICD-10-CM

## 2011-11-27 DIAGNOSIS — IMO0001 Reserved for inherently not codable concepts without codable children: Secondary | ICD-10-CM

## 2011-11-27 NOTE — Progress Notes (Signed)
Pt removed packing from wound on rt lower leg this am before shower then covered with 4x4 prior to office visit.  At office right leg wound (EVH site) was packed with 1/2 inch Nu Gauze,  5" of plain packing strip, wet to dry, covered with 2x2,  4x4 and paper taped. Pt tolerated well. Will return in am for dressing change.

## 2011-11-28 ENCOUNTER — Encounter (INDEPENDENT_AMBULATORY_CARE_PROVIDER_SITE_OTHER): Payer: Self-pay

## 2011-11-28 DIAGNOSIS — I2510 Atherosclerotic heart disease of native coronary artery without angina pectoris: Secondary | ICD-10-CM

## 2011-12-01 ENCOUNTER — Ambulatory Visit (INDEPENDENT_AMBULATORY_CARE_PROVIDER_SITE_OTHER): Payer: Self-pay | Admitting: Physician Assistant

## 2011-12-01 VITALS — BP 135/85 | HR 60 | Temp 97.9°F | Resp 18 | Ht 69.0 in | Wt 264.0 lb

## 2011-12-01 DIAGNOSIS — I2510 Atherosclerotic heart disease of native coronary artery without angina pectoris: Secondary | ICD-10-CM

## 2011-12-01 DIAGNOSIS — L02419 Cutaneous abscess of limb, unspecified: Secondary | ICD-10-CM

## 2011-12-01 DIAGNOSIS — L03119 Cellulitis of unspecified part of limb: Secondary | ICD-10-CM

## 2011-12-01 DIAGNOSIS — Z951 Presence of aortocoronary bypass graft: Secondary | ICD-10-CM

## 2011-12-01 NOTE — Patient Instructions (Signed)
As per my note, normal saline damp to dry dressing changes to the right EVH site. Continue Keflex until completely finished. Keep legs elevated as much as possible. Return to clinic on Thursday for  another wound check with nurse. He is to contact our office if he develops fever, chills, or increased erythema, drainage or purulence.

## 2011-12-01 NOTE — Progress Notes (Signed)
[address]            [name] [address]          [phone]   HPI:  He is status post CABG on 10/27/2011 by Dr. Donata Clay. He returns for further evaluation regarding right EVH site.He had cellulitis and a small abcess at the right [facility] site. The [name] site was opened and packed with wet to dry dressing (with normal saline) and he was placed on Keflex on 11/26/2011. He states that the right [facility] site is continuing to improve. He denies any fever, chills, or purulence.  Current Outpatient Prescriptions  Medication Sig Dispense Refill  . amiodarone (PACERONE) 200 MG tablet Take one po two times daily  60 tablet  1  . aspirin 325 MG EC tablet Take 325 mg by mouth daily.      . cephALEXin (KEFLEX) 500 MG capsule Take 500 mg by mouth 3 (three) times daily.      . fish oil-omega-3 fatty acids 1000 MG capsule Take 2 g by mouth 2 (two) times daily.       . furosemide (LASIX) 40 MG tablet Take 20 mg by mouth daily.       . metoprolol tartrate (LOPRESSOR) 50 MG tablet Take 1 tablet (50 mg total) by mouth 2 (two) times daily.  90 tablet  1  . Multiple Vitamin (MULTIVITAMIN WITH MINERALS) TABS Take 1 tablet by mouth daily.      Marland Kitchen olmesartan (BENICAR) 40 MG tablet Take 40 mg by mouth daily.      Marland Kitchen omeprazole (PRILOSEC) 20 MG capsule Take 20 mg by mouth daily as needed. For acid reflux      . pramipexole (MIRAPEX) 0.75 MG tablet Take 0.75 mg by mouth 2 (two) times daily.      . rosuvastatin (CRESTOR) 40 MG tablet Take 40 mg by mouth daily.      Marland Kitchen warfarin (COUMADIN) 5 MG tablet Take 1 tablet (5 mg total) by mouth daily.  30 tablet  1  Vital Signs: Filed Vitals:   12/01/11 1240  BP: 135/85  Pulse: 60  Temp: 97.9 F (36.6 C)  TempSrc: Oral  Resp: 18  Height: 5\' 9"  (1.753 m)  Weight: 264 lb (119.75 kg)  SpO2: 97%    Physical Exam: Cardiovascular: Slightly bradycardic, no murmur Pulmonary: Clear to auscultation bilaterally Extremities:Chronic venous statis  changes bilaterally;2+ bilateral LE edema Wounds:Stenal wound is clean and dry.2 eschars on chest tube sites.Right EVH site with minor surrounding erythema, sloughy tissue, no purulence.  Impression and Plan: He was instructed to continue Keflex until completely finished.I debrided the sloughy tissue. There was minor bloody ooze afterward. The wound was then packed with normal saline wet to dry gauze and a dry 4 x 4 with tape was applied. Patient was instructed to continue this daily dressing change. He was instructed to not use Neo Sporin or peroxide. He will return  to the office to have the nurse examine the wound and do a dressing change on Thursday 12/04/2011. He was instructed to keep his legs elevated as much as possible. He was also told if he develops any fever, chills, increased erythema, or drainage, he is to call as soon as possible. He has already seen Dr. Mayford Knife in follow up and has an appointment in a few weeks again. Her office is following Coumadin.Finally, we may want to  decrease his Amiodarone to 200 mg daily on his next visit.

## 2011-12-02 ENCOUNTER — Ambulatory Visit: Payer: Medicare Other | Admitting: *Deleted

## 2011-12-04 ENCOUNTER — Encounter: Payer: Self-pay | Admitting: Cardiothoracic Surgery

## 2011-12-04 ENCOUNTER — Ambulatory Visit (INDEPENDENT_AMBULATORY_CARE_PROVIDER_SITE_OTHER): Payer: Self-pay | Admitting: Cardiothoracic Surgery

## 2011-12-04 VITALS — BP 139/86 | HR 98 | Temp 98.6°F | Resp 18 | Ht 69.0 in | Wt 264.0 lb

## 2011-12-04 DIAGNOSIS — Z4800 Encounter for change or removal of nonsurgical wound dressing: Secondary | ICD-10-CM

## 2011-12-04 DIAGNOSIS — IMO0001 Reserved for inherently not codable concepts without codable children: Secondary | ICD-10-CM

## 2011-12-04 DIAGNOSIS — Z951 Presence of aortocoronary bypass graft: Secondary | ICD-10-CM

## 2011-12-04 NOTE — Progress Notes (Signed)
PCP is Lolita Patella, MD Referring Provider is Elias Else, MD  Chief Complaint  Patient presents with  . Wound Check    S/P CABG x4 on 10/27/11  . Routine Post Op    HPI: The patient returns for wound check of his right leg was a and a vein tunnel abscess and surrounding cellulitis treated with wound packing and daily antibiotics-Keflex  . Chronic venous insufficiency of his legs with chronic edema bilaterally. Sternal incision healing well. He is taking care of the wound at home by himself.  Past Medical History  Diagnosis Date  . Hypertension   . Dyslipidemia   . Deviated nasal septum   . GERD (gastroesophageal reflux disease)   . Diabetes mellitus   . Coronary artery disease     Past Surgical History  Procedure Date  . Left knee arthroscopy 2003  . Right knee arthroscopy 2006  . Cardiac catheterization   . Coronary artery bypass graft 10/27/2011    Procedure: CORONARY ARTERY BYPASS GRAFTING (CABG);  Surgeon: Kerin Perna, MD;  Location: [facility] OR;  Service: Open Heart Surgery;  Laterality: N/A;  Coronary Artery Bypass Grafting times four using left internal mammary artery and right greater saphenous vein endoscopically harvested    Family History  Problem Relation Age of Onset  . Stroke Father     Social History History  Substance Use Topics  . Smoking status: Never Smoker   . Smokeless tobacco: Not on file  . Alcohol Use: Yes    Current Outpatient Prescriptions  Medication Sig Dispense Refill  . amiodarone (PACERONE) 200 MG tablet Take 2 tablets (400 mg total) by mouth 2 (two) times daily. For 7 days, then decrease to 1 tab (200mg ) by mouth twice daily  60 tablet  1  . aspirin 325 MG EC tablet Take 325 mg by mouth daily.      . cephALEXin (KEFLEX) 500 MG capsule Take 500 mg by mouth 3 (three) times daily.      . fish oil-omega-3 fatty acids 1000 MG capsule Take 2 g by mouth 2 (two) times daily.       . furosemide (LASIX) 40 MG tablet Take 20 mg by mouth  daily.       . metoprolol tartrate (LOPRESSOR) 25 MG tablet Take 1 tablet (25 mg total) by mouth 3 (three) times daily.  90 tablet  1  . Multiple Vitamin (MULTIVITAMIN WITH MINERALS) TABS Take 1 tablet by mouth daily.      Marland Kitchen olmesartan (BENICAR) 40 MG tablet Take 40 mg by mouth daily.      Marland Kitchen omeprazole (PRILOSEC) 20 MG capsule Take 20 mg by mouth daily as needed. For acid reflux      . pramipexole (MIRAPEX) 0.75 MG tablet Take 0.75 mg by mouth 2 (two) times daily.      . rosuvastatin (CRESTOR) 40 MG tablet Take 40 mg by mouth daily.      Marland Kitchen warfarin (COUMADIN) 5 MG tablet Take 1 tablet (5 mg total) by mouth daily.  30 tablet  1    Allergies  Allergen Reactions  . Lisinopril     Review of Systems no fever no drainage  BP 139/86  Pulse 98  Temp 98.6 F (37 C) (Oral)  Resp 18  Ht 5\' 9"  (1.753 m)  Wt 264 lb (119.75 kg)  BMI 38.99 kg/m2  SpO2 96% Physical Exam Alert and comfortable Lungs clear Cardiac rhythm regular Sternum well-healed Leg incision with some purulent exudate which was debrided  and packed with a 2 x 2 wet-to-dry gauze dressing.  Diagnostic Tests: None  Impression: Continue current dressing changes, office visits every week and oral antibiotics keep leg elevated. Plan: Return to see me in this office in one week.

## 2011-12-04 NOTE — Patient Instructions (Signed)
To pack the wound once or twice a day he antibiotics Keep legs elevated No more than 20 pounds

## 2011-12-08 ENCOUNTER — Ambulatory Visit (INDEPENDENT_AMBULATORY_CARE_PROVIDER_SITE_OTHER): Payer: Self-pay | Admitting: Physician Assistant

## 2011-12-08 VITALS — BP 136/95 | HR 108 | Resp 20 | Ht 69.0 in | Wt 264.0 lb

## 2011-12-08 DIAGNOSIS — T148XXA Other injury of unspecified body region, initial encounter: Secondary | ICD-10-CM

## 2011-12-08 DIAGNOSIS — Z951 Presence of aortocoronary bypass graft: Secondary | ICD-10-CM

## 2011-12-08 NOTE — Progress Notes (Signed)
                    [address]            [place] [address]          [phone]     HPI: The patient returns today for a one week follow up of an infected EVH site and right lower extremity cellulitis.  He has completed a course of Keflex and continues to pack the wound twice daily with saline wet to dry dressings.  He denies fevers, chills, or pain in the area. He continues to have lower extremity edema, and he is on Lasix, although he states he forgot to take it this morning, as he was in a hurry to leave home.  He is otherwise doing well without complaints.  He has an appointment with Dr. Mayford Knife next week for possible cardioversion.  His INR has been 3.4-3.9, and they have adjusted his Coumadin dose accordingly.    Current Outpatient Prescriptions  Medication Sig Dispense Refill  . amiodarone (PACERONE) 200 MG tablet Take 2 tablets (400 mg total) by mouth 2 (two) times daily. For 7 days, then decrease to 1 tab (200mg ) by mouth twice daily  60 tablet  1  . aspirin 325 MG EC tablet Take 325 mg by mouth daily.      . fish oil-omega-3 fatty acids 1000 MG capsule Take 2 g by mouth 2 (two) times daily.       . furosemide (LASIX) 40 MG tablet Take 20 mg by mouth daily.       . metoprolol tartrate (LOPRESSOR) 25 MG tablet Take 1 tablet (25 mg total) by mouth 3 (three) times daily.  90 tablet  1  . Multiple Vitamin (MULTIVITAMIN WITH MINERALS) TABS Take 1 tablet by mouth daily.      Marland Kitchen olmesartan (BENICAR) 40 MG tablet Take 40 mg by mouth daily.      Marland Kitchen omeprazole (PRILOSEC) 20 MG capsule Take 20 mg by mouth daily as needed. For acid reflux      . pramipexole (MIRAPEX) 0.75 MG tablet Take 0.75 mg by mouth 2 (two) times daily.      . rosuvastatin (CRESTOR) 40 MG tablet Take 40 mg by mouth daily.      . cephALEXin (KEFLEX) 500 MG capsule Take 500 mg by mouth 3 (three) times daily.      Marland Kitchen warfarin (COUMADIN) 5 MG tablet Take 1 tablet (5 mg total) by mouth daily.  30 tablet  1      Physical Exam: BP 136/95 HR 108 Resp 20 Wounds: RLE EVH wound is granulating well.  There is no purulence or drainage.  No surrounding erythema.  Sternal wound and chest tube sites well healed without instability. Heart: Irr Irr Lungs: Clear Extremities: +LE edema with chronic venous stasis changes.      Assessment/Plan: The right leg cellulitis appears improved and the wound itself is granulating well.  He will continue wet to dry dressings bid.  I have stressed the importance of taking his Lasix regularly and keeping his legs elevated as much as possible.  We will see him back in 1 week for wound recheck, or sooner if needed.

## 2011-12-10 ENCOUNTER — Encounter: Payer: Medicare Other | Admitting: Cardiothoracic Surgery

## 2011-12-15 ENCOUNTER — Ambulatory Visit (INDEPENDENT_AMBULATORY_CARE_PROVIDER_SITE_OTHER): Payer: Self-pay | Admitting: Surgical

## 2011-12-15 VITALS — BP 121/87 | HR 84 | Temp 98.2°F | Resp 20 | Ht 69.0 in | Wt 264.0 lb

## 2011-12-15 DIAGNOSIS — T148XXA Other injury of unspecified body region, initial encounter: Secondary | ICD-10-CM

## 2011-12-15 DIAGNOSIS — Z951 Presence of aortocoronary bypass graft: Secondary | ICD-10-CM

## 2011-12-15 NOTE — Progress Notes (Signed)
[name]            [place] [address]          [phone]       [name] [place] Medical Record #[id number] Date of Birth: 1945-06-30  James Reichert, MD James Patella, MD  Chief Complaint:   PostOp Follow Up Visit   History of Present Illness:    Patient returns on today's date for followup in his right leg EVH site wound. He continues to do dressing care at home. He states that the wound is smaller in size as well as less deep . He denies fevers chills or other constitutional symptoms. There is no drainage.           History  Smoking status  . Never Smoker   Smokeless tobacco  . Not on file       Allergies  Allergen Reactions  . Lisinopril     Current Outpatient Prescriptions  Medication Sig Dispense Refill  . amiodarone (PACERONE) 200 MG tablet Take 200 mg by mouth 2 (two) times daily.      Marland Kitchen aspirin 325 MG EC tablet Take 325 mg by mouth daily.      . fish oil-omega-3 fatty acids 1000 MG capsule Take 2 g by mouth 2 (two) times daily.       . furosemide (LASIX) 40 MG tablet Take 20 mg by mouth daily.       . metoprolol tartrate (LOPRESSOR) 25 MG tablet Take 25 mg by mouth 2 (two) times daily.      . Multiple Vitamin (MULTIVITAMIN WITH MINERALS) TABS Take 1 tablet by mouth daily.      Marland Kitchen olmesartan (BENICAR) 40 MG tablet Take 40 mg by mouth daily.      Marland Kitchen omeprazole (PRILOSEC) 20 MG capsule Take 20 mg by mouth daily as needed. For acid reflux      . pramipexole (MIRAPEX) 0.75 MG tablet Take 0.75 mg by mouth 2 (two) times daily.      . rosuvastatin (CRESTOR) 40 MG tablet Take 40 mg by mouth daily.      Marland Kitchen warfarin (COUMADIN) 5 MG tablet Take 1 tablet (5 mg total) by mouth daily.  30 tablet  1  . DISCONTD: amiodarone (PACERONE) 200 MG tablet Take 2 tablets (400 mg total) by mouth 2 (two) times daily. For 7 days, then decrease to 1 tab (200mg ) by mouth twice daily  60 tablet  1  .  DISCONTD: metoprolol tartrate (LOPRESSOR) 25 MG tablet Take 1 tablet (25 mg total) by mouth 3 (three) times daily.  90 tablet  1       Physical Exam: BP 121/87  Pulse 84  Temp 98.2 F (36.8 C) (Oral)  Resp 20  Ht 5\' 9"  (1.753 m)  Wt 264 lb (119.75 kg)  BMI 38.99 kg/m2  SpO2 98%  General appearance: alert, cooperative and no distress Wound: The wound is healing well. There is good granulation tissue with some fibrinous exudate. There is no cellulitis   Diagnostic Studies & Laboratory data:         Recent Radiology Findings: No results found.    Recent Labs: Lab Results  Component Value Date   WBC 13.6* 11/02/2011   HGB 9.1* 11/02/2011   HCT  26.7* 11/02/2011   PLT 259 11/02/2011   GLUCOSE 171* 11/03/2011   CHOL 139 10/24/2011   TRIG 102 10/24/2011   HDL 47 10/24/2011   LDLCALC 72 10/24/2011   ALT 24 10/30/2011   AST 39* 10/30/2011   NA 133* 11/03/2011   K 4.0 11/03/2011   CL 95* 11/03/2011   CREATININE 1.45* 11/03/2011   BUN 48* 11/03/2011   CO2 28 11/03/2011   TSH 1.557 10/23/2011   INR 1.23 11/03/2011   HGBA1C 7.8* 10/23/2011      Assessment / Plan:  The right lower leg EVH site wound infection continues to heal nicely. He will continue his current dressing changes. We will see him in 3 weeks to reevaluate the wound and sooner when necessary.          [name] 12/15/2011 1:14 PM

## 2011-12-15 NOTE — Patient Instructions (Signed)
Patient understands verbal instructions for continued wound care as well as followup.

## 2011-12-22 ENCOUNTER — Encounter: Payer: Medicare Other | Attending: Cardiology | Admitting: *Deleted

## 2011-12-22 ENCOUNTER — Other Ambulatory Visit: Payer: Self-pay | Admitting: Cardiology

## 2011-12-22 ENCOUNTER — Encounter: Payer: Self-pay | Admitting: *Deleted

## 2011-12-22 VITALS — Ht 69.0 in | Wt 255.1 lb

## 2011-12-22 DIAGNOSIS — E1165 Type 2 diabetes mellitus with hyperglycemia: Secondary | ICD-10-CM

## 2011-12-22 DIAGNOSIS — IMO0002 Reserved for concepts with insufficient information to code with codable children: Secondary | ICD-10-CM | POA: Insufficient documentation

## 2011-12-22 DIAGNOSIS — E118 Type 2 diabetes mellitus with unspecified complications: Secondary | ICD-10-CM | POA: Insufficient documentation

## 2011-12-22 DIAGNOSIS — Z713 Dietary counseling and surveillance: Secondary | ICD-10-CM | POA: Insufficient documentation

## 2011-12-22 NOTE — Progress Notes (Signed)
  Medical Nutrition Therapy:  Appt start time: 0845 end time:  0945.   Assessment:  Primary concerns today: patient here for initial diabetes education. He is newly diagnosed with DM2 with recent history of quadruple bypass surgery. 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 He is currently living with his elderly parents while he is having a log home built. He prepares his own meals as his food preferences are different from his parents'. Last A1c on 10/23/2011 = 7.8% MEDICATIONS: see list. No diabetes medications at this time   DIETARY INTAKE: Usual eating pattern includes 3 meals and 1-2 snacks per day.  Everyday foods include good variety of all food groups.  Avoided foods include french fries, cow's milk makes him nauseous, Nutty Buddy ice cream anymore .    24-hr recall:  B ( AM): 3 bacon lean, 2 eggs, 2 toast plain, decaf coffee with Splenda Snk ( AM): occasionally a fresh fruit  L ( PM): 6 inch sandwich or hamburger, sweet tea Snk ( PM): occasionally a sugar free jello D ( PM): lean meat, often fish and chicken grilled, starch and vegetables OR Shaklee Shake Snk ( PM): sugar free jello OR Weight Watchers ice cream Beverages: coffee, sweet tea, water,   Usual physical activity: walks often (20 minutes twice a day) , cardiac rehab post quad bypass, used to work out in gym twice a week and loves golf! History of playing and referring soccer for 40 years  Estimated energy needs: 1800 calories 200 g carbohydrates 135 g protein 50 g fat  Progress Towards Goal(s):  In progress.   Nutritional Diagnosis:  NB-1.1 Food and nutrition-related knowledge deficit As related to new diagnosis of diabetes.  As evidenced by A1c of 7.8 on 10/23/2011.    Intervention:  Nutrition counseling and diabetes education initiated. Discussed basic physiology of diabetes, SMBG and rationale of checking BG at alternate times of day, A1c,  Carb Counting and reading food labels, and benefits of increased activity.  Handouts given during visit include: Living Well with Diabetes Carb Counting and Food Label handouts Meal Plan Card  Monitoring/Evaluation:  Dietary intake, exercise, reading food labels, and body weight prn. He plans to call for appointment after cardioversion next week is completed.

## 2011-12-24 ENCOUNTER — Encounter: Payer: Self-pay | Admitting: *Deleted

## 2011-12-25 ENCOUNTER — Encounter (HOSPITAL_COMMUNITY)
Admission: RE | Admit: 2011-12-25 | Discharge: 2011-12-25 | Disposition: A | Discharge status: Home / Self Care | Payer: Medicare Other | Source: Ambulatory Visit | Attending: Cardiology | Admitting: Cardiology

## 2011-12-25 DIAGNOSIS — E119 Type 2 diabetes mellitus without complications: Secondary | ICD-10-CM | POA: Insufficient documentation

## 2011-12-25 DIAGNOSIS — E785 Hyperlipidemia, unspecified: Secondary | ICD-10-CM | POA: Insufficient documentation

## 2011-12-25 DIAGNOSIS — N289 Disorder of kidney and ureter, unspecified: Secondary | ICD-10-CM | POA: Insufficient documentation

## 2011-12-25 DIAGNOSIS — Z79899 Other long term (current) drug therapy: Secondary | ICD-10-CM | POA: Insufficient documentation

## 2011-12-25 DIAGNOSIS — I2510 Atherosclerotic heart disease of native coronary artery without angina pectoris: Secondary | ICD-10-CM | POA: Insufficient documentation

## 2011-12-25 DIAGNOSIS — Z7982 Long term (current) use of aspirin: Secondary | ICD-10-CM | POA: Insufficient documentation

## 2011-12-25 DIAGNOSIS — I4891 Unspecified atrial fibrillation: Secondary | ICD-10-CM | POA: Insufficient documentation

## 2011-12-25 DIAGNOSIS — Z951 Presence of aortocoronary bypass graft: Secondary | ICD-10-CM | POA: Insufficient documentation

## 2011-12-25 DIAGNOSIS — I10 Essential (primary) hypertension: Secondary | ICD-10-CM | POA: Insufficient documentation

## 2011-12-25 DIAGNOSIS — Z5189 Encounter for other specified aftercare: Secondary | ICD-10-CM | POA: Insufficient documentation

## 2011-12-25 DIAGNOSIS — I200 Unstable angina: Secondary | ICD-10-CM | POA: Insufficient documentation

## 2011-12-25 DIAGNOSIS — K219 Gastro-esophageal reflux disease without esophagitis: Secondary | ICD-10-CM | POA: Insufficient documentation

## 2011-12-25 NOTE — Progress Notes (Signed)
Cardiac Rehab Medication Review by a Pharmacist  Does the patient  feel that his/her medications are working for him/her?  yes  Has the patient been experiencing any side effects to the medications prescribed?  no  Does the patient measure his/her own blood pressure or blood glucose at home?  no   Does the patient have any problems obtaining medications due to transportation or finances?   no  Understanding of regimen: good Understanding of indications: good Potential of compliance: good    Pharmacist comments: Patient was pleasant and knowledgeable of his current regimen.   [name] 12/25/2011 8:08 AM

## 2011-12-29 ENCOUNTER — Other Ambulatory Visit: Payer: Self-pay | Admitting: Cardiology

## 2011-12-29 ENCOUNTER — Encounter (HOSPITAL_COMMUNITY)
Admission: RE | Disposition: A | Discharge status: Home / Self Care | Payer: Self-pay | Source: Ambulatory Visit | Attending: Cardiology

## 2011-12-29 ENCOUNTER — Encounter (HOSPITAL_COMMUNITY): Payer: Medicare Other

## 2011-12-29 ENCOUNTER — Ambulatory Visit (HOSPITAL_COMMUNITY)
Admission: RE | Admit: 2011-12-29 | Discharge: 2011-12-29 | Disposition: A | Discharge status: Home / Self Care | Payer: Medicare Other | Source: Ambulatory Visit | Attending: Cardiology | Admitting: Cardiology

## 2011-12-29 DIAGNOSIS — I4891 Unspecified atrial fibrillation: Secondary | ICD-10-CM | POA: Insufficient documentation

## 2011-12-29 DIAGNOSIS — Z538 Procedure and treatment not carried out for other reasons: Secondary | ICD-10-CM | POA: Insufficient documentation

## 2011-12-29 SURGERY — CANCELLED PROCEDURE

## 2011-12-29 MED ORDER — SODIUM CHLORIDE 0.9 % IV SOLN: INTRAVENOUS | Status: DC

## 2011-12-31 ENCOUNTER — Encounter (HOSPITAL_COMMUNITY)
Admission: RE | Admit: 2011-12-31 | Discharge: 2011-12-31 | Disposition: A | Discharge status: Home / Self Care | Payer: Medicare Other | Source: Ambulatory Visit | Attending: Cardiology | Admitting: Cardiology

## 2012-01-01 NOTE — Progress Notes (Signed)
Pt started cardiac rehab today.  Pt tolerated light exercise without difficulty. Telemetry Sinus Rhythm vital signs stable.  Dressing clean dry and intact to Right leg . Will continue to monitor the patient throughout  the program.

## 2012-01-02 ENCOUNTER — Encounter (HOSPITAL_COMMUNITY)
Admission: RE | Admit: 2012-01-02 | Discharge: 2012-01-02 | Disposition: A | Discharge status: Home / Self Care | Payer: Medicare Other | Source: Ambulatory Visit | Attending: Cardiology | Admitting: Cardiology

## 2012-01-02 LAB — GLUCOSE, CAPILLARY: Glucose-Capillary: 125 mg/dL — ABNORMAL HIGH (ref 70–99)

## 2012-01-05 ENCOUNTER — Ambulatory Visit (INDEPENDENT_AMBULATORY_CARE_PROVIDER_SITE_OTHER): Payer: Self-pay | Admitting: Physician Assistant

## 2012-01-05 ENCOUNTER — Encounter (HOSPITAL_COMMUNITY): Payer: Medicare Other

## 2012-01-05 VITALS — BP 143/82 | HR 62 | Resp 19 | Ht 69.0 in | Wt 257.0 lb

## 2012-01-05 DIAGNOSIS — I2510 Atherosclerotic heart disease of native coronary artery without angina pectoris: Secondary | ICD-10-CM

## 2012-01-05 DIAGNOSIS — Z5189 Encounter for other specified aftercare: Secondary | ICD-10-CM

## 2012-01-05 NOTE — Progress Notes (Signed)
                     [address]            [name] [address]          [phone]     HPI: Patient returns for recheck of his right lower extremity EVH site.  He had been packing the area twice daily with saline wet to dry dressings, but over the past week, the wound has healed.  He denies any fevers, chills, redness, or drainage.  He was scheduled for an outpatient cardioversion last week, but when he presented to Dr. Norris Cross office, he was noted to be in sinus rhythm, so the procedure was canceled.  Dr. Mayford Knife also decreased his Lasix dose to 20 mg daily.  He is overall doing well.   Current Outpatient Prescriptions  Medication Sig Dispense Refill  . amiodarone (PACERONE) 200 MG tablet Take 200 mg by mouth 2 (two) times daily.      Marland Kitchen aspirin 81 MG tablet Take 81 mg by mouth daily.      . fish oil-omega-3 fatty acids 1000 MG capsule Take 2 g by mouth 2 (two) times daily.       . furosemide (LASIX) 40 MG tablet Take 20 mg by mouth daily.       . metoprolol tartrate (LOPRESSOR) 25 MG tablet Take 25 mg by mouth 2 (two) times daily.      . Multiple Vitamin (MULTIVITAMIN WITH MINERALS) TABS Take 1 tablet by mouth daily.      Marland Kitchen olmesartan (BENICAR) 40 MG tablet Take 40 mg by mouth daily.      Marland Kitchen omeprazole (PRILOSEC) 20 MG capsule Take 20 mg by mouth daily as needed. For acid reflux      . pramipexole (MIRAPEX) 0.75 MG tablet Take 0.75 mg by mouth See admin instructions. Takes 2 tabs in evening      . rosuvastatin (CRESTOR) 40 MG tablet Take 40 mg by mouth daily.      Marland Kitchen warfarin (COUMADIN) 5 MG tablet Take 1 tablet (5 mg total) by mouth daily.  30 tablet  1     Physical Exam: BP 143/82 HR 62 Resp 19 Wounds: All chest wounds have healed well. Right lower extremity EVH site is now completely closed.  There is no surrounding erythema or drainage.   Heart: RRR Lungs: Clear Extremities: +LE edema    Assessment/Plan: His right lower extremity EVH site has healed.   He remains edematous, but otherwise is progressing well.  We will have him follow up with Dr. Donata Clay in 1 month for a final recheck.  He may call sooner if he has any issues in the interim.

## 2012-01-07 ENCOUNTER — Encounter (HOSPITAL_COMMUNITY)
Admission: RE | Admit: 2012-01-07 | Discharge: 2012-01-07 | Disposition: A | Discharge status: Home / Self Care | Payer: Medicare Other | Source: Ambulatory Visit | Attending: Cardiology | Admitting: Cardiology

## 2012-01-09 ENCOUNTER — Encounter (HOSPITAL_COMMUNITY)
Admission: RE | Admit: 2012-01-09 | Discharge: 2012-01-09 | Disposition: A | Discharge status: Home / Self Care | Payer: Medicare Other | Source: Ambulatory Visit | Attending: Cardiology | Admitting: Cardiology

## 2012-01-09 NOTE — Progress Notes (Signed)
Reviewed home exercise with pt today.  Pt plans to walk at home and continue to attend [employer] at [facility] for exercise.  After rehab is over, pt plans to return to his personal trainer as well.Reviewed THR, pulse, RPE, sign and symptoms, and when to call 911 or MD.  Pt voiced understanding. Fabio Pierce, MA, ACSM RCEP

## 2012-01-12 ENCOUNTER — Encounter (HOSPITAL_COMMUNITY)
Admission: RE | Admit: 2012-01-12 | Discharge: 2012-01-12 | Disposition: A | Discharge status: Home / Self Care | Payer: Medicare Other | Source: Ambulatory Visit | Attending: Cardiology | Admitting: Cardiology

## 2012-01-14 ENCOUNTER — Encounter (HOSPITAL_COMMUNITY)
Admission: RE | Admit: 2012-01-14 | Discharge: 2012-01-14 | Disposition: A | Discharge status: Home / Self Care | Payer: Medicare Other | Source: Ambulatory Visit | Attending: Cardiology | Admitting: Cardiology

## 2012-01-16 ENCOUNTER — Encounter (HOSPITAL_COMMUNITY): Payer: Medicare Other

## 2012-01-19 ENCOUNTER — Encounter (HOSPITAL_COMMUNITY)
Admission: RE | Admit: 2012-01-19 | Discharge: 2012-01-19 | Disposition: A | Discharge status: Home / Self Care | Payer: Medicare Other | Source: Ambulatory Visit | Attending: Cardiology | Admitting: Cardiology

## 2012-01-19 DIAGNOSIS — K219 Gastro-esophageal reflux disease without esophagitis: Secondary | ICD-10-CM | POA: Insufficient documentation

## 2012-01-19 DIAGNOSIS — N289 Disorder of kidney and ureter, unspecified: Secondary | ICD-10-CM | POA: Insufficient documentation

## 2012-01-19 DIAGNOSIS — Z79899 Other long term (current) drug therapy: Secondary | ICD-10-CM | POA: Insufficient documentation

## 2012-01-19 DIAGNOSIS — E785 Hyperlipidemia, unspecified: Secondary | ICD-10-CM | POA: Insufficient documentation

## 2012-01-19 DIAGNOSIS — I4891 Unspecified atrial fibrillation: Secondary | ICD-10-CM | POA: Insufficient documentation

## 2012-01-19 DIAGNOSIS — Z5189 Encounter for other specified aftercare: Secondary | ICD-10-CM | POA: Insufficient documentation

## 2012-01-19 DIAGNOSIS — I200 Unstable angina: Secondary | ICD-10-CM | POA: Insufficient documentation

## 2012-01-19 DIAGNOSIS — E119 Type 2 diabetes mellitus without complications: Secondary | ICD-10-CM | POA: Insufficient documentation

## 2012-01-19 DIAGNOSIS — Z7982 Long term (current) use of aspirin: Secondary | ICD-10-CM | POA: Insufficient documentation

## 2012-01-19 DIAGNOSIS — I2510 Atherosclerotic heart disease of native coronary artery without angina pectoris: Secondary | ICD-10-CM | POA: Insufficient documentation

## 2012-01-19 DIAGNOSIS — Z951 Presence of aortocoronary bypass graft: Secondary | ICD-10-CM | POA: Insufficient documentation

## 2012-01-19 DIAGNOSIS — I10 Essential (primary) hypertension: Secondary | ICD-10-CM | POA: Insufficient documentation

## 2012-01-21 ENCOUNTER — Encounter (HOSPITAL_COMMUNITY)
Admission: RE | Admit: 2012-01-21 | Discharge: 2012-01-21 | Disposition: A | Discharge status: Home / Self Care | Payer: Medicare Other | Source: Ambulatory Visit | Attending: Cardiology | Admitting: Cardiology

## 2012-01-23 ENCOUNTER — Encounter (HOSPITAL_COMMUNITY)
Admission: RE | Admit: 2012-01-23 | Discharge: 2012-01-23 | Disposition: A | Discharge status: Home / Self Care | Payer: Medicare Other | Source: Ambulatory Visit | Attending: Cardiology | Admitting: Cardiology

## 2012-01-26 ENCOUNTER — Encounter (HOSPITAL_COMMUNITY)
Admission: RE | Admit: 2012-01-26 | Discharge: 2012-01-26 | Disposition: A | Discharge status: Home / Self Care | Payer: Medicare Other | Source: Ambulatory Visit | Attending: Cardiology | Admitting: Cardiology

## 2012-01-28 ENCOUNTER — Encounter (HOSPITAL_COMMUNITY)
Admission: RE | Admit: 2012-01-28 | Discharge: 2012-01-28 | Disposition: A | Discharge status: Home / Self Care | Payer: Medicare Other | Source: Ambulatory Visit | Attending: Cardiology | Admitting: Cardiology

## 2012-01-30 ENCOUNTER — Encounter (HOSPITAL_COMMUNITY)
Admission: RE | Admit: 2012-01-30 | Discharge: 2012-01-30 | Disposition: A | Discharge status: Home / Self Care | Payer: Medicare Other | Source: Ambulatory Visit | Attending: Cardiology | Admitting: Cardiology

## 2012-02-02 ENCOUNTER — Encounter (HOSPITAL_COMMUNITY)
Admission: RE | Admit: 2012-02-02 | Discharge: 2012-02-02 | Disposition: A | Discharge status: Home / Self Care | Payer: Medicare Other | Source: Ambulatory Visit | Attending: Cardiology | Admitting: Cardiology

## 2012-02-04 ENCOUNTER — Encounter: Payer: Self-pay | Admitting: Cardiothoracic Surgery

## 2012-02-04 ENCOUNTER — Encounter (HOSPITAL_COMMUNITY)
Admission: RE | Admit: 2012-02-04 | Discharge: 2012-02-04 | Disposition: A | Discharge status: Home / Self Care | Payer: Medicare Other | Source: Ambulatory Visit | Attending: Cardiology | Admitting: Cardiology

## 2012-02-04 ENCOUNTER — Ambulatory Visit (INDEPENDENT_AMBULATORY_CARE_PROVIDER_SITE_OTHER): Payer: Self-pay | Admitting: Cardiothoracic Surgery

## 2012-02-04 VITALS — BP 140/77 | HR 67 | Resp 18 | Ht 69.0 in | Wt 259.0 lb

## 2012-02-04 DIAGNOSIS — Z5189 Encounter for other specified aftercare: Secondary | ICD-10-CM

## 2012-02-04 DIAGNOSIS — Z951 Presence of aortocoronary bypass graft: Secondary | ICD-10-CM

## 2012-02-04 DIAGNOSIS — I2510 Atherosclerotic heart disease of native coronary artery without angina pectoris: Secondary | ICD-10-CM

## 2012-02-04 NOTE — Progress Notes (Signed)
PCP is Lolita Patella, MD Referring Provider is Quintella Reichert, MD  Chief Complaint  Patient presents with  . Routine Post Op    1 month f/u wound check at [facility] site, S/P CABG  x 4 on 10/27/11    HPI: Final surgical followup after multivessel bypass grafting September with postop right lower leg infected hematoma requiring wound care and antibiotics. Wound is now completely healed. Patient progressing with great rehabilitation. No further angina or other complaints    Past Medical History  Diagnosis Date  . Hypertension   . Dyslipidemia   . Deviated nasal septum   . GERD (gastroesophageal reflux disease)   . Diabetes mellitus   . Coronary artery disease     Past Surgical History  Procedure Date  . Left knee arthroscopy 2003  . Right knee arthroscopy 2006  . Cardiac catheterization   . Coronary artery bypass graft 10/27/2011    Procedure: CORONARY ARTERY BYPASS GRAFTING (CABG);  Surgeon: Kerin Perna, MD;  Location: [facility] OR;  Service: Open Heart Surgery;  Laterality: N/A;  Coronary Artery Bypass Grafting times four using left internal mammary artery and right greater saphenous vein endoscopically harvested    Family History  Problem Relation Age of Onset  . Stroke Father     Social History History  Substance Use Topics  . Smoking status: Never Smoker   . Smokeless tobacco: Never Used  . Alcohol Use: Yes     Comment: 1 beer a month or less    Current Outpatient Prescriptions  Medication Sig Dispense Refill  . amiodarone (PACERONE) 200 MG tablet Take 200 mg by mouth 2 (two) times daily.      Marland Kitchen aspirin 81 MG tablet Take 81 mg by mouth daily.      . fish oil-omega-3 fatty acids 1000 MG capsule Take 2 g by mouth 2 (two) times daily.       . furosemide (LASIX) 40 MG tablet Take 20 mg by mouth daily.       . metoprolol tartrate (LOPRESSOR) 25 MG tablet Take 25 mg by mouth 2 (two) times daily.      . Multiple Vitamin (MULTIVITAMIN WITH MINERALS) TABS Take 1 tablet  by mouth daily.      Marland Kitchen olmesartan (BENICAR) 40 MG tablet Take 40 mg by mouth daily.      Marland Kitchen omeprazole (PRILOSEC) 20 MG capsule Take 20 mg by mouth daily as needed. For acid reflux      . pramipexole (MIRAPEX) 0.75 MG tablet Take 0.75 mg by mouth See admin instructions. Takes 2 tabs in evening      . rosuvastatin (CRESTOR) 40 MG tablet Take 40 mg by mouth daily.      Marland Kitchen warfarin (COUMADIN) 5 MG tablet Take 1 tablet (5 mg total) by mouth daily.  30 tablet  1    Allergies  Allergen Reactions  . Lisinopril Cough    Review of SystemsNo temperature elevation no angina wounds are healed  BP 140/77  Pulse 67  Resp 18  Ht 5\' 9"  (1.753 m)  Wt 259 lb (117.482 kg)  BMI 38.25 kg/m2  SpO2 97% Physical Exam Breathing comfortably Breath sounds clear Sternal wound stable and well-healed Cardiac rhythm regular Baseline mild bilateral pedal edema  Diagnostic Tests:   Impression: Wounds healed return to primary physician's  Plan:Return as needed

## 2012-02-06 ENCOUNTER — Encounter (HOSPITAL_COMMUNITY): Payer: Medicare Other

## 2012-02-09 ENCOUNTER — Encounter (HOSPITAL_COMMUNITY)
Admission: RE | Admit: 2012-02-09 | Discharge: 2012-02-09 | Disposition: A | Discharge status: Home / Self Care | Payer: Medicare Other | Source: Ambulatory Visit | Attending: Cardiology | Admitting: Cardiology

## 2012-02-13 ENCOUNTER — Encounter (HOSPITAL_COMMUNITY)
Admission: RE | Admit: 2012-02-13 | Discharge: 2012-02-13 | Disposition: A | Discharge status: Home / Self Care | Payer: Medicare Other | Source: Ambulatory Visit | Attending: Cardiology | Admitting: Cardiology

## 2012-02-16 ENCOUNTER — Encounter (HOSPITAL_COMMUNITY)
Admission: RE | Admit: 2012-02-16 | Discharge: 2012-02-16 | Disposition: A | Discharge status: Home / Self Care | Payer: Medicare Other | Source: Ambulatory Visit | Attending: Cardiology | Admitting: Cardiology

## 2012-02-16 NOTE — Progress Notes (Signed)
Arnet saw Dr Mayford Knife this morning. Riccardo said Dr. Mayford Knife stopped his amiodarone and his warfarin.

## 2012-02-18 ENCOUNTER — Encounter (HOSPITAL_COMMUNITY): Payer: Medicare Other

## 2012-02-20 ENCOUNTER — Encounter (HOSPITAL_COMMUNITY)
Admission: RE | Admit: 2012-02-20 | Discharge: 2012-02-20 | Disposition: A | Discharge status: Home / Self Care | Payer: Medicare Other | Source: Ambulatory Visit | Attending: Cardiology | Admitting: Cardiology

## 2012-02-20 DIAGNOSIS — I2510 Atherosclerotic heart disease of native coronary artery without angina pectoris: Secondary | ICD-10-CM | POA: Insufficient documentation

## 2012-02-20 DIAGNOSIS — I4891 Unspecified atrial fibrillation: Secondary | ICD-10-CM | POA: Insufficient documentation

## 2012-02-20 DIAGNOSIS — Z79899 Other long term (current) drug therapy: Secondary | ICD-10-CM | POA: Insufficient documentation

## 2012-02-20 DIAGNOSIS — I200 Unstable angina: Secondary | ICD-10-CM | POA: Insufficient documentation

## 2012-02-20 DIAGNOSIS — N289 Disorder of kidney and ureter, unspecified: Secondary | ICD-10-CM | POA: Insufficient documentation

## 2012-02-20 DIAGNOSIS — E119 Type 2 diabetes mellitus without complications: Secondary | ICD-10-CM | POA: Insufficient documentation

## 2012-02-20 DIAGNOSIS — Z7982 Long term (current) use of aspirin: Secondary | ICD-10-CM | POA: Insufficient documentation

## 2012-02-20 DIAGNOSIS — Z5189 Encounter for other specified aftercare: Secondary | ICD-10-CM | POA: Insufficient documentation

## 2012-02-20 DIAGNOSIS — Z951 Presence of aortocoronary bypass graft: Secondary | ICD-10-CM | POA: Insufficient documentation

## 2012-02-20 DIAGNOSIS — E785 Hyperlipidemia, unspecified: Secondary | ICD-10-CM | POA: Insufficient documentation

## 2012-02-20 DIAGNOSIS — I10 Essential (primary) hypertension: Secondary | ICD-10-CM | POA: Insufficient documentation

## 2012-02-20 DIAGNOSIS — K219 Gastro-esophageal reflux disease without esophagitis: Secondary | ICD-10-CM | POA: Insufficient documentation

## 2012-02-23 ENCOUNTER — Encounter (HOSPITAL_COMMUNITY)
Admission: RE | Admit: 2012-02-23 | Discharge: 2012-02-23 | Disposition: A | Discharge status: Home / Self Care | Payer: Medicare Other | Source: Ambulatory Visit | Attending: Cardiology | Admitting: Cardiology

## 2012-02-25 ENCOUNTER — Encounter (HOSPITAL_COMMUNITY)
Admission: RE | Admit: 2012-02-25 | Discharge: 2012-02-25 | Disposition: A | Discharge status: Home / Self Care | Payer: Medicare Other | Source: Ambulatory Visit | Attending: Cardiology | Admitting: Cardiology

## 2012-02-25 NOTE — Progress Notes (Signed)
Waldo will be monitoring his blood pressures at home twice a day per Dr Mayford Knife.

## 2012-02-27 ENCOUNTER — Encounter (HOSPITAL_COMMUNITY)
Admission: RE | Admit: 2012-02-27 | Discharge: 2012-02-27 | Disposition: A | Discharge status: Home / Self Care | Payer: Medicare Other | Source: Ambulatory Visit | Attending: Cardiology | Admitting: Cardiology

## 2012-03-01 ENCOUNTER — Encounter (HOSPITAL_COMMUNITY)
Admission: RE | Admit: 2012-03-01 | Discharge: 2012-03-01 | Disposition: A | Discharge status: Home / Self Care | Payer: Medicare Other | Source: Ambulatory Visit | Attending: Cardiology | Admitting: Cardiology

## 2012-03-03 ENCOUNTER — Encounter (HOSPITAL_COMMUNITY)
Admission: RE | Admit: 2012-03-03 | Discharge: 2012-03-03 | Disposition: A | Discharge status: Home / Self Care | Payer: Medicare Other | Source: Ambulatory Visit | Attending: Cardiology | Admitting: Cardiology

## 2012-03-05 ENCOUNTER — Encounter (HOSPITAL_COMMUNITY)
Admission: RE | Admit: 2012-03-05 | Discharge: 2012-03-05 | Disposition: A | Discharge status: Home / Self Care | Payer: Medicare Other | Source: Ambulatory Visit | Attending: Cardiology | Admitting: Cardiology

## 2012-03-08 ENCOUNTER — Encounter (HOSPITAL_COMMUNITY)
Admission: RE | Admit: 2012-03-08 | Discharge: 2012-03-08 | Disposition: A | Discharge status: Home / Self Care | Payer: Medicare Other | Source: Ambulatory Visit | Attending: Cardiology | Admitting: Cardiology

## 2012-03-08 NOTE — Progress Notes (Signed)
James [name] weight was up 1.5 kg from 03/05/2012.  James [name] denies shortness of breath.  James [name] does have some lower extremity edema.  Lung fields clear upon auscultation. Sa02 96% on room air.  Encouraged James [name] to watch his salt intake. Will fax exercise flow sheets to Dr. Norris Cross office for review.

## 2012-03-10 ENCOUNTER — Encounter (HOSPITAL_COMMUNITY): Payer: Medicare Other

## 2012-03-12 ENCOUNTER — Encounter (HOSPITAL_COMMUNITY): Payer: Medicare Other

## 2012-03-15 ENCOUNTER — Encounter (HOSPITAL_COMMUNITY): Payer: Medicare Other

## 2012-03-17 ENCOUNTER — Encounter (HOSPITAL_COMMUNITY): Payer: Medicare Other

## 2012-03-19 ENCOUNTER — Encounter (HOSPITAL_COMMUNITY): Payer: Medicare Other

## 2012-03-19 NOTE — Progress Notes (Signed)
James [name]'s blood pressure upon arrival to cardiac rehab was 160/96.  Had the patient to sit down repeat blood pressure 162/80.  James [name] did not exercise today due to elevated blood pressure.  James [name] says he is under a lot of stress due to family issues.  Repeat blood pressure 142/70.  Dr Norris Cross office called and notified of today's blood pressures.  Faxed exercise flow sheets to Dr Norris Cross office for review.

## 2012-03-22 ENCOUNTER — Encounter (HOSPITAL_COMMUNITY)
Admission: RE | Admit: 2012-03-22 | Discharge: 2012-03-22 | Disposition: A | Discharge status: Home / Self Care | Payer: Medicare Other | Source: Ambulatory Visit | Attending: Cardiology | Admitting: Cardiology

## 2012-03-22 DIAGNOSIS — I2510 Atherosclerotic heart disease of native coronary artery without angina pectoris: Secondary | ICD-10-CM | POA: Insufficient documentation

## 2012-03-22 DIAGNOSIS — I4891 Unspecified atrial fibrillation: Secondary | ICD-10-CM | POA: Insufficient documentation

## 2012-03-22 DIAGNOSIS — E119 Type 2 diabetes mellitus without complications: Secondary | ICD-10-CM | POA: Insufficient documentation

## 2012-03-22 DIAGNOSIS — Z5189 Encounter for other specified aftercare: Secondary | ICD-10-CM | POA: Insufficient documentation

## 2012-03-22 DIAGNOSIS — K219 Gastro-esophageal reflux disease without esophagitis: Secondary | ICD-10-CM | POA: Insufficient documentation

## 2012-03-22 DIAGNOSIS — Z7982 Long term (current) use of aspirin: Secondary | ICD-10-CM | POA: Insufficient documentation

## 2012-03-22 DIAGNOSIS — N289 Disorder of kidney and ureter, unspecified: Secondary | ICD-10-CM | POA: Insufficient documentation

## 2012-03-22 DIAGNOSIS — E785 Hyperlipidemia, unspecified: Secondary | ICD-10-CM | POA: Insufficient documentation

## 2012-03-22 DIAGNOSIS — Z79899 Other long term (current) drug therapy: Secondary | ICD-10-CM | POA: Insufficient documentation

## 2012-03-22 DIAGNOSIS — I10 Essential (primary) hypertension: Secondary | ICD-10-CM | POA: Insufficient documentation

## 2012-03-22 DIAGNOSIS — I200 Unstable angina: Secondary | ICD-10-CM | POA: Insufficient documentation

## 2012-03-22 DIAGNOSIS — Z951 Presence of aortocoronary bypass graft: Secondary | ICD-10-CM | POA: Insufficient documentation

## 2012-03-22 NOTE — Progress Notes (Signed)
James [name]'s blood pressure was 168/98 upon entry to cardiac rehab.  Had the patient to sit down.  Repeat blood pressure 152/70 after walking a few warm up laps on the track.  James [name] says he is still under alot of family stress.  James [name] did not exercise today.  Dr Norris Cross office notified of blood pressure elevation.  Spoke with [employer].  James [name] is to continue to monitor his blood pressure per Dr Mayford Knife the rest of the week.  James [name]'s exit blood pressure was 128/80.  James [name] spent the entire class time consulting with the Registered Dietitian.

## 2012-03-22 NOTE — Progress Notes (Addendum)
James [name] 67 y.o. male Nutrition Note Spoke with pt.  Nutrition Plan and Nutrition Survey goals reviewed with pt. Pt is following Step 2 of the Therapeutic Lifestyle Changes diet "except when I'm under a lot of stress." Pt is a self-reported stress eater. 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 Tai chi, meditation). Pt encouraged to talk with Fabio Pierce, EP, if he is interested in Tai chi. Per nutrition screen, pt wants to lose wt. Pt's current wt is 120.9 kg, which is up 4.9 kg since starting Cardiac Rehab. Pt has not been trying to lose wt. Pt reports he was able to lose wt when he was drinking a smoothie for breakfast or lunch. Pt stopped drinking his green drink because he was on Coumadin. Pt now off of Coumadin and plans to restart his green drink. Wt loss tips reviewed.  Pt is diabetic according to pt's medical record. Per pt, "I've been borderline for a long time." Pt's last A1c indicates blood glucose poorly controlled. Pt states he has attended the DM education class at his MD's office. Pt does not check his CBG's or take DM medication at this time. This Clinical research associate went over Diabetes Education test results. Pt expressed understanding of the above information reviewed. Pt aware of nutrition education classes offered and is unable to attend nutrition classes .  Nutrition Diagnosis   Food-and nutrition-related knowledge deficit related to lack of exposure to information as related to diagnosis of: ? CVD ? DM (A1c 7.8)   Obesity related to excessive energy intake as evidenced by a BMI of 38.3  Nutrition RX/ Estimated Daily Nutrition Needs for: wt loss  1700-2200 Kcal, 45-60 gm fat, 11-15 gm sat fat, 1.6-2.2 gm  trans-fat, <1500 mg sodium, 250 gm CHO   Nutrition Intervention   Pt's individual nutrition plan including cholesterol goals reviewed with pt.   Benefits of adopting Therapeutic Lifestyle Changes discussed when Medficts reviewed.   Pt to attend the Portion Distortion class   Pt to attend the  ? Diabetes Q & A class   Pt given handouts for: ? Nutrition I class ? Nutrition II class ? Diabetes Blitz class    Continue client-centered nutrition education by RD, as part of interdisciplinary care. Goal(s)   Pt to identify food quantities necessary to achieve: ? wt loss to a goal wt of 231-243 lb (105.1-110.5 kg) at graduation from cardiac rehab.    Pt able to name foods that affect blood glucose  Monitor and Evaluate progress toward nutrition goal with team. Nutrition Risk: Change to Moderate

## 2012-03-23 ENCOUNTER — Telehealth (HOSPITAL_COMMUNITY): Payer: Self-pay | Admitting: *Deleted

## 2012-03-24 ENCOUNTER — Encounter (HOSPITAL_COMMUNITY)
Admission: RE | Admit: 2012-03-24 | Discharge: 2012-03-24 | Disposition: A | Discharge status: Home / Self Care | Payer: Medicare Other | Source: Ambulatory Visit | Attending: Cardiology | Admitting: Cardiology

## 2012-03-26 ENCOUNTER — Encounter (HOSPITAL_COMMUNITY)
Admission: RE | Admit: 2012-03-26 | Discharge: 2012-03-26 | Disposition: A | Discharge status: Home / Self Care | Payer: Medicare Other | Source: Ambulatory Visit | Attending: Cardiology | Admitting: Cardiology

## 2012-03-29 ENCOUNTER — Encounter (HOSPITAL_COMMUNITY)
Admission: RE | Admit: 2012-03-29 | Discharge: 2012-03-29 | Disposition: A | Discharge status: Home / Self Care | Payer: Medicare Other | Source: Ambulatory Visit | Attending: Cardiology | Admitting: Cardiology

## 2012-03-29 NOTE — Progress Notes (Signed)
James [name]'s entry blood pressure was 154/92.  Blood pressure at 196/104 then 210/106 on the airdyne. Patient moved to the track repeat blood pressure in the 160's systolic.  Exit blood pressure 142/70 after rest. Will fax exercise flow sheets to Dr. Norris Cross office for review.  James [name] is to call his home blood pressures to Dr Norris Cross office today.

## 2012-03-31 ENCOUNTER — Encounter (HOSPITAL_COMMUNITY): Payer: Medicare Other

## 2012-04-02 ENCOUNTER — Encounter (HOSPITAL_COMMUNITY): Payer: Medicare Other

## 2012-04-05 ENCOUNTER — Encounter (HOSPITAL_COMMUNITY): Payer: Medicare Other

## 2012-04-07 ENCOUNTER — Encounter (HOSPITAL_COMMUNITY)
Admission: RE | Admit: 2012-04-07 | Discharge: 2012-04-07 | Disposition: A | Discharge status: Home / Self Care | Payer: Medicare Other | Source: Ambulatory Visit | Attending: Cardiology | Admitting: Cardiology

## 2012-04-07 NOTE — Progress Notes (Signed)
Aurther Loft saw Cristopher Peru ANP on Monday.  Cynthia added amlodipine and kdur to his medication regimen. Will continue to monitor blood pressure.

## 2012-04-09 ENCOUNTER — Encounter (HOSPITAL_COMMUNITY): Payer: Medicare Other

## 2012-04-12 ENCOUNTER — Encounter (HOSPITAL_COMMUNITY)
Admission: RE | Admit: 2012-04-12 | Discharge: 2012-04-12 | Disposition: A | Discharge status: Home / Self Care | Payer: Medicare Other | Source: Ambulatory Visit | Attending: Cardiology | Admitting: Cardiology

## 2012-04-12 NOTE — Progress Notes (Signed)
Initial blood pressure 180/102 after rest repeat blood pressure 132/60.  Harvard exercised at lower work loads and did not use the bike or weights.  Exit blood pressure 154/80.  Heat rate 65.  Left message about Xeng's blood pressure elevations.  Faxed exercise flow sheets to Dr Norris Cross office for review.

## 2012-04-13 ENCOUNTER — Other Ambulatory Visit: Payer: Self-pay | Admitting: Physician Assistant

## 2012-04-14 ENCOUNTER — Encounter (HOSPITAL_COMMUNITY)
Admission: RE | Admit: 2012-04-14 | Discharge: 2012-04-14 | Disposition: A | Discharge status: Home / Self Care | Payer: Medicare Other | Source: Ambulatory Visit | Attending: Cardiology | Admitting: Cardiology

## 2012-04-16 ENCOUNTER — Encounter (HOSPITAL_COMMUNITY)
Admission: RE | Admit: 2012-04-16 | Discharge: 2012-04-16 | Disposition: A | Discharge status: Home / Self Care | Payer: Medicare Other | Source: Ambulatory Visit | Attending: Cardiology | Admitting: Cardiology

## 2012-04-19 ENCOUNTER — Encounter (HOSPITAL_COMMUNITY)
Admission: RE | Admit: 2012-04-19 | Discharge: 2012-04-19 | Disposition: A | Discharge status: Home / Self Care | Payer: Medicare Other | Source: Ambulatory Visit | Attending: Cardiology | Admitting: Cardiology

## 2012-04-19 DIAGNOSIS — Z79899 Other long term (current) drug therapy: Secondary | ICD-10-CM | POA: Insufficient documentation

## 2012-04-19 DIAGNOSIS — E119 Type 2 diabetes mellitus without complications: Secondary | ICD-10-CM | POA: Insufficient documentation

## 2012-04-19 DIAGNOSIS — E785 Hyperlipidemia, unspecified: Secondary | ICD-10-CM | POA: Insufficient documentation

## 2012-04-19 DIAGNOSIS — K219 Gastro-esophageal reflux disease without esophagitis: Secondary | ICD-10-CM | POA: Insufficient documentation

## 2012-04-19 DIAGNOSIS — I200 Unstable angina: Secondary | ICD-10-CM | POA: Insufficient documentation

## 2012-04-19 DIAGNOSIS — Z7982 Long term (current) use of aspirin: Secondary | ICD-10-CM | POA: Insufficient documentation

## 2012-04-19 DIAGNOSIS — N289 Disorder of kidney and ureter, unspecified: Secondary | ICD-10-CM | POA: Insufficient documentation

## 2012-04-19 DIAGNOSIS — I10 Essential (primary) hypertension: Secondary | ICD-10-CM | POA: Insufficient documentation

## 2012-04-19 DIAGNOSIS — I4891 Unspecified atrial fibrillation: Secondary | ICD-10-CM | POA: Insufficient documentation

## 2012-04-19 DIAGNOSIS — Z5189 Encounter for other specified aftercare: Secondary | ICD-10-CM | POA: Insufficient documentation

## 2012-04-19 DIAGNOSIS — I2510 Atherosclerotic heart disease of native coronary artery without angina pectoris: Secondary | ICD-10-CM | POA: Insufficient documentation

## 2012-04-19 DIAGNOSIS — Z951 Presence of aortocoronary bypass graft: Secondary | ICD-10-CM | POA: Insufficient documentation

## 2012-04-19 NOTE — Progress Notes (Signed)
Darryn's weight was 124.0.  Weight on Friday was 122.9.  Aurther Loft saw Cristopher Peru ANP and is aware of Kylle's weight gain.  Egon said Aram Beecham increased his Furosemide. Will obtain office noted from today. Sa02 95% on room air.Will continue to monitor the patient throughout  the program.

## 2012-04-21 ENCOUNTER — Encounter (HOSPITAL_COMMUNITY)
Admission: RE | Admit: 2012-04-21 | Discharge: 2012-04-21 | Disposition: A | Discharge status: Home / Self Care | Payer: Medicare Other | Source: Ambulatory Visit | Attending: Cardiology | Admitting: Cardiology

## 2012-04-23 ENCOUNTER — Encounter (HOSPITAL_COMMUNITY): Payer: Medicare Other

## 2012-04-26 ENCOUNTER — Encounter (HOSPITAL_COMMUNITY)
Admission: RE | Admit: 2012-04-26 | Discharge: 2012-04-26 | Disposition: A | Discharge status: Home / Self Care | Payer: Medicare Other | Source: Ambulatory Visit | Attending: Cardiology | Admitting: Cardiology

## 2012-04-26 NOTE — Progress Notes (Signed)
James [name]'s weight  Is 124.5 kg today.  James [name] denies SOB.  Sa02 95%.  James [name] said he took extra lasix for a few days but is now taking his maintenance dose of 20 mg a day. Will Notify James [name] ANP about James [name]'s weight.

## 2012-04-28 ENCOUNTER — Encounter (HOSPITAL_COMMUNITY)
Admission: RE | Admit: 2012-04-28 | Discharge: 2012-04-28 | Disposition: A | Discharge status: Home / Self Care | Payer: Medicare Other | Source: Ambulatory Visit | Attending: Cardiology | Admitting: Cardiology

## 2012-04-29 NOTE — Progress Notes (Signed)
James [name] graduated yesterday. James [name] plans to continue exercise on his own.  James [name] did not take his beta blocker yesterday.  Blood pressure elevated on the bike.  Yesterday's blood pressures sent to Dr Norris Cross office for review. James [name] was instructed on the importance of taking his medication every day.  Patient stated understanding.

## 2012-04-30 ENCOUNTER — Encounter (HOSPITAL_COMMUNITY): Payer: Medicare Other

## 2012-11-13 ENCOUNTER — Other Ambulatory Visit: Payer: Self-pay | Admitting: Cardiology

## 2012-11-13 DIAGNOSIS — E7800 Pure hypercholesterolemia, unspecified: Secondary | ICD-10-CM

## 2012-11-13 DIAGNOSIS — Z79899 Other long term (current) drug therapy: Secondary | ICD-10-CM

## 2012-11-17 ENCOUNTER — Other Ambulatory Visit: Payer: Self-pay | Admitting: General Surgery

## 2012-11-17 MED ORDER — ROSUVASTATIN CALCIUM 40 MG PO TABS: 40.0000 mg | ORAL_TABLET | Freq: Every day | ORAL | Status: DC

## 2012-11-25 ENCOUNTER — Other Ambulatory Visit (INDEPENDENT_AMBULATORY_CARE_PROVIDER_SITE_OTHER): Payer: Medicare Other

## 2012-11-25 DIAGNOSIS — Z79899 Other long term (current) drug therapy: Secondary | ICD-10-CM

## 2012-11-25 DIAGNOSIS — E7800 Pure hypercholesterolemia, unspecified: Secondary | ICD-10-CM

## 2012-11-25 LAB — LIPID PANEL
Cholesterol: 183 mg/dL (ref 0–200)
HDL: 40.6 mg/dL (ref >39.00)
LDL Cholesterol: 115 mg/dL — ABNORMAL HIGH (ref 0–99)
Triglycerides: 135 mg/dL (ref 0.0–149.0)
VLDL: 27 mg/dL (ref 0.0–40.0)

## 2012-12-09 ENCOUNTER — Telehealth: Payer: Self-pay | Admitting: Pharmacist

## 2012-12-09 DIAGNOSIS — Z79899 Other long term (current) drug therapy: Secondary | ICD-10-CM

## 2012-12-09 DIAGNOSIS — E785 Hyperlipidemia, unspecified: Secondary | ICD-10-CM

## 2012-12-09 MED ORDER — EZETIMIBE 10 MG PO TABS: 10.0000 mg | ORAL_TABLET | Freq: Every day | ORAL | Status: DC

## 2012-12-09 NOTE — Telephone Encounter (Signed)
RF:  CABG (10/2011), HTN, age - LDL goal < 70, non-HDL goal < 100. Meds:  Crestor 40 mg qd, Zetia 10 mg qd LDL went up from 88 mg/dL to 161 mg/dL over past 3 months.  Discussed with patient over the phone, and he explains to me he ran out of Crestor days before having his bloodwork drawn.  His baseline LDL is ~ 145 mg/dL off meds.  Patient is back on both Crestor and Zetia daily, and tolerating them both.  He was advised to recheck blood work in 4 months, and if for some reason he runs out of meds for a few days prior to blood work, to call me so I can r/s his appointment.  Need to see what his panel looks like on meds.  PCSK-9 inhibitor could be an option if LDL still > 70 on both agents.  Continue Crestor 40 mg + Zetia 10 mg qd.  Don't run out again.  labwork set up for 04/08/13 to recheck lipid/liver.

## 2013-01-14 ENCOUNTER — Telehealth: Payer: Self-pay

## 2013-01-14 MED ORDER — METOPROLOL SUCCINATE ER 100 MG PO TB24: 100.0000 mg | ORAL_TABLET | Freq: Every day | ORAL | Status: DC

## 2013-01-14 NOTE — Telephone Encounter (Signed)
rx sent in for pt.  

## 2013-02-09 ENCOUNTER — Encounter: Payer: Self-pay | Admitting: General Surgery

## 2013-02-09 DIAGNOSIS — R609 Edema, unspecified: Secondary | ICD-10-CM | POA: Insufficient documentation

## 2013-02-09 DIAGNOSIS — Z79899 Other long term (current) drug therapy: Secondary | ICD-10-CM | POA: Insufficient documentation

## 2013-02-09 HISTORY — DX: Edema, unspecified: R60.9

## 2013-02-23 ENCOUNTER — Ambulatory Visit: Payer: Medicare Other | Admitting: Cardiology

## 2013-03-09 ENCOUNTER — Encounter: Payer: Self-pay | Admitting: Cardiology

## 2013-03-09 ENCOUNTER — Encounter: Payer: Self-pay | Admitting: General Surgery

## 2013-03-09 ENCOUNTER — Telehealth: Payer: Self-pay | Admitting: Physician Assistant

## 2013-03-09 ENCOUNTER — Ambulatory Visit (INDEPENDENT_AMBULATORY_CARE_PROVIDER_SITE_OTHER): Payer: Medicare Other | Admitting: Cardiology

## 2013-03-09 ENCOUNTER — Ambulatory Visit (HOSPITAL_COMMUNITY): Payer: Medicare Other | Attending: Cardiology | Admitting: Cardiology

## 2013-03-09 VITALS — BP 140/98 | HR 62 | Ht 69.0 in | Wt 250.4 lb

## 2013-03-09 DIAGNOSIS — I10 Essential (primary) hypertension: Secondary | ICD-10-CM

## 2013-03-09 DIAGNOSIS — E785 Hyperlipidemia, unspecified: Secondary | ICD-10-CM | POA: Insufficient documentation

## 2013-03-09 DIAGNOSIS — I5189 Other ill-defined heart diseases: Secondary | ICD-10-CM | POA: Insufficient documentation

## 2013-03-09 DIAGNOSIS — I359 Nonrheumatic aortic valve disorder, unspecified: Secondary | ICD-10-CM

## 2013-03-09 DIAGNOSIS — R609 Edema, unspecified: Secondary | ICD-10-CM | POA: Insufficient documentation

## 2013-03-09 DIAGNOSIS — I5032 Chronic diastolic (congestive) heart failure: Secondary | ICD-10-CM | POA: Insufficient documentation

## 2013-03-09 DIAGNOSIS — I77810 Thoracic aortic ectasia: Secondary | ICD-10-CM | POA: Insufficient documentation

## 2013-03-09 DIAGNOSIS — I379 Nonrheumatic pulmonary valve disorder, unspecified: Secondary | ICD-10-CM | POA: Insufficient documentation

## 2013-03-09 DIAGNOSIS — R9431 Abnormal electrocardiogram [ECG] [EKG]: Secondary | ICD-10-CM | POA: Insufficient documentation

## 2013-03-09 DIAGNOSIS — Z951 Presence of aortocoronary bypass graft: Secondary | ICD-10-CM | POA: Insufficient documentation

## 2013-03-09 DIAGNOSIS — I2510 Atherosclerotic heart disease of native coronary artery without angina pectoris: Secondary | ICD-10-CM | POA: Insufficient documentation

## 2013-03-09 DIAGNOSIS — E119 Type 2 diabetes mellitus without complications: Secondary | ICD-10-CM | POA: Insufficient documentation

## 2013-03-09 HISTORY — DX: Abnormal electrocardiogram (ECG) (EKG): R94.31

## 2013-03-09 LAB — TROPONIN I: Troponin I: <0.3 ng/mL (ref <0.30)

## 2013-03-09 LAB — CK TOTAL AND CKMB (NOT AT ARMC)
CK TOTAL: 72 U/L (ref 7–232)
CK, MB: 2.5 ng/mL (ref 0.3–4.0)

## 2013-03-09 NOTE — Progress Notes (Signed)
[address] [place], [place]  [address] Phone: [phone] Fax:  [phone]  Date:  03/09/2013   ID:  James [name], DOB 09-06-1945, MRN [id number]  PCP:  Lolita PatellaEADE,ROBERT ALEXANDER, MD  Cardiologist:  Armanda Magicraci Bana Borgmeyer, MD   History of Present Illness: James [name] is a 68 y.o. male with a history of ASCAD, HTN, dyslipidemia and chronic diastolic CHF with chronic LE edema, who presents today for followup.  He is doing well.  He denies any chest pain, SOB, DOE, LE edema, dizziness, palpitations or syncope.     Wt Readings from Last 3 Encounters:  03/09/13 250 lb 6.4 oz (113.581 kg)  02/04/12 259 lb (117.482 kg)  01/05/12 257 lb (116.574 kg)     Past Medical History  Diagnosis Date  . Hypertension   . Dyslipidemia   . Deviated nasal septum   . GERD (gastroesophageal reflux disease)   . Diabetes mellitus     onset 2013  . Coronary artery disease 10/2011    s/p cabg  . Atrial flutter     Post-op with no reoccurence  . Dilated aortic root   . Diastolic dysfunction     grade II by echo 05/2012    Current Outpatient Prescriptions  Medication Sig Dispense Refill  . amLODipine (NORVASC) 5 MG tablet Take 10 mg by mouth daily.       Marland Kitchen. aspirin 81 MG tablet Take 81 mg by mouth daily.      Marland Kitchen. ezetimibe (ZETIA) 10 MG tablet Take 1 tablet (10 mg total) by mouth daily.  90 tablet  3  . fish oil-omega-3 fatty acids 1000 MG capsule Take 2 g by mouth 2 (two) times daily.       . metoprolol succinate (TOPROL-XL) 100 MG 24 hr tablet Take 1 tablet (100 mg total) by mouth daily. Take with or immediately following a meal.  30 tablet  11  . Multiple Vitamin (MULTIVITAMIN WITH MINERALS) TABS Take 1 tablet by mouth daily.      Marland Kitchen. olmesartan (BENICAR) 40 MG tablet Take 40 mg by mouth daily.      Marland Kitchen. omeprazole (PRILOSEC) 20 MG capsule Take 20 mg by mouth daily as needed. For acid reflux      . potassium chloride SA (K-DUR,KLOR-CON) 20 MEQ tablet Take 20 mEq by mouth daily. And extra if needed       . pramipexole (MIRAPEX) 0.75 MG tablet Take 0.75 mg by mouth See admin instructions. Takes 2 tabs in evening      . rosuvastatin (CRESTOR) 40 MG tablet Take 1 tablet (40 mg total) by mouth daily.  30 tablet  11  . spironolactone (ALDACTONE) 25 MG tablet Take 25 mg by mouth daily.      . furosemide (LASIX) 40 MG tablet Take 20 mg by mouth daily.        No current facility-administered medications for this visit.    Allergies:    Allergies  Allergen Reactions  . Lisinopril Cough    Social History:  The patient  reports that he has never smoked. He has never used smokeless tobacco. He reports that he drinks alcohol. He reports that he does not use illicit drugs.   Family History:  The patient's family history includes Stroke in his father.   ROS:  Please see the history of present illness.      All other systems reviewed and negative.   PHYSICAL EXAM: VS:  BP 140/98  Pulse 62  Ht  5\' 9"  (1.753 m)  Wt 250 lb 6.4 oz (113.581 kg)  BMI 36.96 kg/m2 Well nourished, well developed, in no acute distress HEENT: normal Neck: no JVD Cardiac:  normal S1, S2; RRR; no murmur Lungs:  clear to auscultation bilaterally, no wheezing, rhonchi or rales Abd: soft, nontender, no hepatomegaly Ext: no edema Skin: warm and dry Neuro:  CNs 2-12 intact, no focal abnormalities noted  EKG:  NSR with ST/T wave abnormality in V1 throught V2 and 1mm of ST elevation in III and aVF which is new  ASSESSMENT AND PLAN:  1. ASCAD with no angina   - continue ASA  - his EKG shows new ST elevation in III and aVF but he is completely asymptomatic.  His EKG is definitely different than in the past.  I will get a stat CPK and Troponin and stat EKG to make sure there is no wall motion abnormality 2. HTN - DBP poorly controlled on exam today.  He has a history of white coat HTN  - continue Aldactone/amlodipine/Toprol/Benicar  - check BMET   - I have asked him to check his BP daily for a week and call with  results 3. Dyslipidemia - last LDL done 08/2012 was 88  - recheck fasting lipids and ALT  - continue Zetia/Crestor 4.  Chronic diastolic CHF - he stopped his Lasix on his own but did not stop his postassium  - continue Aldactone  - stop potassium  - will get into spironolactone clinic  Followup with me in 6 months Signed, Armanda Magic, MD 03/09/2013 3:40 PM

## 2013-03-09 NOTE — Progress Notes (Signed)
Echo performed. 

## 2013-03-09 NOTE — Telephone Encounter (Signed)
Shanda BumpsJessica with [facility] called on call service to report stat labs of CK/MB and troponin that were all normal. Chart reviewed. No further intervention is necessary tonight, will forward to Dr. Mayford Knifeurner. Buffey Zabinski PA-C

## 2013-03-09 NOTE — Patient Instructions (Addendum)
Your physician has recommended you make the following change in your medication: Stop ptotassium  You have been referred to Sp[facility]olactone [facility] Sallie or Riki RuskJeremy will be calling you to schedule this appointment  Your physician has requested that you have an echocardiogram. Echocardiography is a painless test that uses sound waves to create images of your heart. It provides your doctor with information about the size and shape of your heart and how well your heart's chambers and valves are working. This procedure takes approximately one hour. There are no restrictions for this procedure.  Your physician recommends that you go to the lab today for STAT Troponin and CPK/MB  .Your physician wants you to follow-up in: 6 Months with Dr Sherlyn Lickurner You will receive a reminder letter in the mail two months in advance. If you don't receive a letter, please call our office to schedule the follow-up appointment.

## 2013-03-10 ENCOUNTER — Telehealth: Payer: Self-pay | Admitting: General Surgery

## 2013-03-10 ENCOUNTER — Other Ambulatory Visit: Payer: Self-pay | Admitting: General Surgery

## 2013-03-10 ENCOUNTER — Encounter: Payer: Self-pay | Admitting: Cardiology

## 2013-03-10 ENCOUNTER — Telehealth: Payer: Self-pay | Admitting: *Deleted

## 2013-03-10 DIAGNOSIS — I719 Aortic aneurysm of unspecified site, without rupture: Secondary | ICD-10-CM

## 2013-03-10 DIAGNOSIS — I77810 Thoracic aortic ectasia: Secondary | ICD-10-CM

## 2013-03-10 LAB — BASIC METABOLIC PANEL
BUN: 18 mg/dL (ref 6–23)
CALCIUM: 8.8 mg/dL (ref 8.4–10.5)
CO2: 23 meq/L (ref 19–32)
CREATININE: 1.5 mg/dL (ref 0.4–1.5)
Chloride: 95 mEq/L — ABNORMAL LOW (ref 96–112)
GFR: 48.75 mL/min — ABNORMAL LOW (ref >60.00)
GLUCOSE: 712 mg/dL — AB (ref 70–99)
Potassium: 4.6 mEq/L (ref 3.5–5.1)
Sodium: 126 mEq/L — ABNORMAL LOW (ref 135–145)

## 2013-03-10 NOTE — Progress Notes (Signed)
Quick Note:  Patient called back to triage nurse. Patient had lab work this morning at PCP office[facility]ajaraFried[facility]. Patient is to see Dr. Foy GuadalajaraFried today at 1:30 pm in appointment. Results from 03/09/13 lab work faxed to Dr. Pablo LawrenceFried's office. Triage phone call routed to [facility] Cors[facility], CMA. ______

## 2013-03-10 NOTE — Telephone Encounter (Signed)
Danielle C./Dr. Mayford Knifeurner - stated patient is having repeat BMET with Dr. Foy GuadalajaraFried today.

## 2013-03-10 NOTE — Telephone Encounter (Signed)
Dr. Foy GuadalajaraFried, PCP - [facility] [phone]) notified of patient's Critical Glucose Misty Stanley(Lisa).  Informed them of Critical Glucose of 712 from lab draw on 03/09/13.  Results from 03/09/13 lab work faxed to Dr. Pablo LawrenceFried's office [phone]).   Attempted to call patient. LM on VM.   Patient called back into triage RN. Patient will be seeing Dr. Foy GuadalajaraFried today at 1:30 pm. Patient states he had more lab work drawn today at his office. Patient states he will have Dr. Pablo LawrenceFried's office sent us the updated lab results from today.   Called Dr. Pablo LawrenceFried's office to update them that patient did contact us and was informed of the elevated Glucose level. Requested Misty StanleyLisa to call us back for status update.

## 2013-03-10 NOTE — Telephone Encounter (Signed)
Critical Glucose called in on patient from lab of 712. Attempted to call patient. LMTCB on VM.  Will contact PCP.

## 2013-03-10 NOTE — Telephone Encounter (Signed)
Cardiac MRI with out contrast. Ordered in EPIC. Pt is aware and we cancelled the CT Angio pt was scheduled for. Pt is awaiting a call to be scheduled.

## 2013-03-10 NOTE — Telephone Encounter (Signed)
Dr Mayford Knifeurner decided she did not want to do a CT angio. Instead she wants the pt to be scheduled for

## 2013-03-16 ENCOUNTER — Other Ambulatory Visit: Payer: Medicare Other

## 2013-03-22 ENCOUNTER — Telehealth: Payer: Self-pay | Admitting: Cardiology

## 2013-03-22 NOTE — Telephone Encounter (Signed)
New Problem:  Pt is calling in with BP readings.  1/30       143/83 1/31        136/76 2/1          160/94 2/2           138/77 2/3           147/73

## 2013-03-22 NOTE — Telephone Encounter (Signed)
BPs for the most part are controlled - continue current medical regimen

## 2013-03-22 NOTE — Telephone Encounter (Signed)
lvm for pt to make aware.

## 2013-03-22 NOTE — Telephone Encounter (Signed)
To Dr Turner to advise 

## 2013-03-24 ENCOUNTER — Ambulatory Visit (HOSPITAL_COMMUNITY)
Admission: RE | Admit: 2013-03-24 | Discharge: 2013-03-24 | Disposition: A | Discharge status: Home / Self Care | Payer: Medicare Other | Source: Ambulatory Visit | Attending: Cardiology | Admitting: Cardiology

## 2013-03-24 ENCOUNTER — Other Ambulatory Visit: Payer: Self-pay | Admitting: Cardiology

## 2013-03-24 DIAGNOSIS — I517 Cardiomegaly: Secondary | ICD-10-CM | POA: Insufficient documentation

## 2013-03-24 DIAGNOSIS — I719 Aortic aneurysm of unspecified site, without rupture: Secondary | ICD-10-CM

## 2013-03-24 DIAGNOSIS — I77819 Aortic ectasia, unspecified site: Secondary | ICD-10-CM | POA: Insufficient documentation

## 2013-03-24 MED ORDER — GADOBENATE DIMEGLUMINE 529 MG/ML IV SOLN
15.0000 mL | Freq: Once | INTRAVENOUS | Status: AC
  Administered 2013-03-24: 15 mL via INTRAVENOUS

## 2013-03-28 ENCOUNTER — Telehealth: Payer: Self-pay | Admitting: Cardiology

## 2013-03-28 NOTE — Telephone Encounter (Signed)
Called pt to make aware.  °

## 2013-03-28 NOTE — Telephone Encounter (Signed)
New message ° ° ° ° ° °Want MRI results  °

## 2013-04-08 ENCOUNTER — Other Ambulatory Visit: Payer: Medicare Other

## 2013-04-12 ENCOUNTER — Other Ambulatory Visit: Payer: Medicare Other

## 2013-04-19 ENCOUNTER — Other Ambulatory Visit: Payer: Medicare Other

## 2013-04-20 ENCOUNTER — Encounter: Payer: Self-pay | Admitting: General Surgery

## 2013-04-20 ENCOUNTER — Other Ambulatory Visit (INDEPENDENT_AMBULATORY_CARE_PROVIDER_SITE_OTHER): Payer: Medicare Other

## 2013-04-20 DIAGNOSIS — I77810 Thoracic aortic ectasia: Secondary | ICD-10-CM

## 2013-04-20 DIAGNOSIS — Z79899 Other long term (current) drug therapy: Secondary | ICD-10-CM

## 2013-04-20 DIAGNOSIS — E785 Hyperlipidemia, unspecified: Secondary | ICD-10-CM

## 2013-04-20 LAB — LIPID PANEL
Cholesterol: 133 mg/dL (ref 0–200)
HDL: 40.6 mg/dL (ref >39.00)
LDL Cholesterol: 73 mg/dL (ref 0–99)
Total CHOL/HDL Ratio: 3
Triglycerides: 97 mg/dL (ref 0.0–149.0)
VLDL: 19.4 mg/dL (ref 0.0–40.0)

## 2013-04-20 LAB — HEPATIC FUNCTION PANEL
ALBUMIN: 4 g/dL (ref 3.5–5.2)
ALK PHOS: 84 U/L (ref 39–117)
ALT: 17 U/L (ref 0–53)
AST: 13 U/L (ref 0–37)
BILIRUBIN TOTAL: 1 mg/dL (ref 0.3–1.2)
Bilirubin, Direct: 0 mg/dL (ref 0.0–0.3)
Total Protein: 7.2 g/dL (ref 6.0–8.3)

## 2013-04-20 LAB — BASIC METABOLIC PANEL
BUN: 15 mg/dL (ref 6–23)
CALCIUM: 9.2 mg/dL (ref 8.4–10.5)
CO2: 29 mEq/L (ref 19–32)
CREATININE: 1.1 mg/dL (ref 0.4–1.5)
Chloride: 103 mEq/L (ref 96–112)
GFR: 71.53 mL/min (ref >60.00)
GLUCOSE: 157 mg/dL — AB (ref 70–99)
Potassium: 4.3 mEq/L (ref 3.5–5.1)
SODIUM: 136 meq/L (ref 135–145)

## 2013-05-25 ENCOUNTER — Other Ambulatory Visit (HOSPITAL_COMMUNITY): Payer: Medicare Other

## 2013-11-01 ENCOUNTER — Ambulatory Visit (INDEPENDENT_AMBULATORY_CARE_PROVIDER_SITE_OTHER): Payer: Medicare Other | Admitting: Cardiology

## 2013-11-01 ENCOUNTER — Encounter: Payer: Self-pay | Admitting: Cardiology

## 2013-11-01 VITALS — BP 146/88 | HR 68 | Ht 69.0 in | Wt 266.8 lb

## 2013-11-01 DIAGNOSIS — IMO0002 Reserved for concepts with insufficient information to code with codable children: Secondary | ICD-10-CM

## 2013-11-01 DIAGNOSIS — E785 Hyperlipidemia, unspecified: Secondary | ICD-10-CM

## 2013-11-01 DIAGNOSIS — E1165 Type 2 diabetes mellitus with hyperglycemia: Secondary | ICD-10-CM

## 2013-11-01 DIAGNOSIS — I2510 Atherosclerotic heart disease of native coronary artery without angina pectoris: Secondary | ICD-10-CM

## 2013-11-01 DIAGNOSIS — I77810 Thoracic aortic ectasia: Secondary | ICD-10-CM

## 2013-11-01 DIAGNOSIS — R0602 Shortness of breath: Secondary | ICD-10-CM

## 2013-11-01 DIAGNOSIS — I5032 Chronic diastolic (congestive) heart failure: Secondary | ICD-10-CM

## 2013-11-01 DIAGNOSIS — E118 Type 2 diabetes mellitus with unspecified complications: Secondary | ICD-10-CM

## 2013-11-01 DIAGNOSIS — I10 Essential (primary) hypertension: Secondary | ICD-10-CM

## 2013-11-01 LAB — BASIC METABOLIC PANEL
BUN: 18 mg/dL (ref 6–23)
CALCIUM: 9.2 mg/dL (ref 8.4–10.5)
CO2: 29 mEq/L (ref 19–32)
CREATININE: 1.2 mg/dL (ref 0.4–1.5)
Chloride: 100 mEq/L (ref 96–112)
GFR: 65.81 mL/min (ref >60.00)
Glucose, Bld: 93 mg/dL (ref 70–99)
POTASSIUM: 3.6 meq/L (ref 3.5–5.1)
Sodium: 137 mEq/L (ref 135–145)

## 2013-11-01 LAB — BRAIN NATRIURETIC PEPTIDE: Pro B Natriuretic peptide (BNP): 33 pg/mL (ref 0.0–100.0)

## 2013-11-01 NOTE — Progress Notes (Signed)
[address] [place], [place]  [address] Phone: [phone] Fax:  [phone]  Date:  11/01/2013   ID:  James [name], James [name] March 03, 1945, MRN [id number]  PCP:  Lolita Patella, MD  Cardiologist:  Armanda Magic, MD     History of Present Illness: James [name] is a 68 y.o. male with a history of ASCAD, HTN, dyslipidemia and chronic diastolic CHF with chronic LE edema, who presents today for followup. He is doing well. He denies LE edema, dizziness, palpitations or syncope. He is concerned that his DOE has progressed after walking about 20 yards.  He has chest pain once daily lasting about 1-2 minutes and resolves.  He describes it as a pressure but has never taken any NTG.  He also states that he has a lot of exertional fatigue.  He also has a lot of nausea which is form the metformin,   Wt Readings from Last 3 Encounters:  11/01/13 266 lb 12.8 oz (121.02 kg)  03/09/13 250 lb 6.4 oz (113.581 kg)  02/04/12 259 lb (117.482 kg)    Past Medical History  Diagnosis Date  . Hypertension   . Dyslipidemia   . Deviated nasal septum   . GERD (gastroesophageal reflux disease)   . Diabetes mellitus     onset 2013  . Coronary artery disease 10/2011    s/p cabg  . Atrial flutter     Post-op with no reoccurence  . Dilated aortic root   . Diastolic dysfunction     grade II by echo 05/2012    Current Outpatient Prescriptions  Medication Sig Dispense Refill  . amLODipine (NORVASC) 5 MG tablet Take 10 mg by mouth daily.       Marland Kitchen aspirin 81 MG tablet Take 81 mg by mouth daily.      . fish oil-omega-3 fatty acids 1000 MG capsule Take 2 g by mouth 2 (two) times daily.       . metoprolol succinate (TOPROL-XL) 100 MG 24 hr tablet Take 1 tablet (100 mg total) by mouth daily. Take with or immediately following a meal.  30 tablet  11  . Multiple Vitamin (MULTIVITAMIN WITH MINERALS) TABS Take 1 tablet by mouth daily.      Marland Kitchen olmesartan (BENICAR) 40 MG tablet Take 40 mg by mouth daily.       Marland Kitchen omeprazole (PRILOSEC) 20 MG capsule Take 20 mg by mouth daily as needed. For acid reflux      . pramipexole (MIRAPEX) 0.75 MG tablet Take 0.75 mg by mouth See admin instructions. Takes 2 tabs in evening      . spironolactone (ALDACTONE) 25 MG tablet Take 25 mg by mouth daily.       No current facility-administered medications for this visit.    Allergies:    Allergies  Allergen Reactions  . Lisinopril Cough    Social History:  The patient  reports that he has never smoked. He has never used smokeless tobacco. He reports that he drinks alcohol. He reports that he does not use illicit drugs.   Family History:  The patient's family history includes Stroke in his father.   ROS:  Please see the history of present illness.      All other systems reviewed and negative.   PHYSICAL EXAM: VS:  BP 146/88  Pulse 68  Ht  (1.753 m)  Wt 266 lb 12.8 oz (121.02 kg)  BMI 39.38 kg/m2 Well nourished, well developed, in no acute distress HEENT:  normal Neck: no JVD Cardiac:  normal S1, S2; RRR; no murmur Lungs:  clear to auscultation bilaterally, no wheezing, rhonchi or rales Abd: soft, nontender, no hepatomegaly Ext: trace edema Skin: warm and dry Neuro:  CNs 2-12 intact, no focal abnormalities noted   ASSESSMENT AND PLAN:  1. ASCAD now with some angina on a daily basis.  His last noninasive ischemic w/u was in 2013  - continue ASA/BB/statin  - stress myoview to rule out ischemia 2. HTN -  borderline controlled on exam today. He has a history of white coat HTN and at home it runs 140/49mmHg - continue Aldactone/amlodipine/Toprol/Benicar  - check BMET  3. Dyslipidemia - last LDL done 08/2012 was 88 - recheck fasting lipids and ALT  - continue Lipitor       4. Chronic diastolic CHF  - he has some mild edema on exam - continue Aldactone        5.  SOB - will check a BNP  Followup with me in clinic  in 6 months  Signed, Armanda Magic, MD 11/01/2013 2:39 PM

## 2013-11-01 NOTE — Patient Instructions (Signed)
Your physician recommends that you continue on your current medications as directed. Please refer to the Current Medication list given to you today.  Your physician recommends that you go to the lab today for a BMET and BNP  Your physician recommends that you return for lab work for Fasting LIPID and ALT  Your physician has requested that you have an exercise stress myoview. For further information please visit [email or web address] Please follow instruction sheet, as given.  Your physician wants you to follow-up in: 6 months with Dr Sherlyn Lick will receive a reminder letter in the mail two months in advance. If you don't receive a letter, please call our office to schedule the follow-up appointment.

## 2013-11-03 ENCOUNTER — Encounter: Payer: Self-pay | Admitting: General Surgery

## 2013-11-11 ENCOUNTER — Ambulatory Visit (HOSPITAL_COMMUNITY): Payer: Medicare Other | Attending: Cardiology | Admitting: Radiology

## 2013-11-11 ENCOUNTER — Telehealth: Payer: Self-pay | Admitting: *Deleted

## 2013-11-11 ENCOUNTER — Other Ambulatory Visit (INDEPENDENT_AMBULATORY_CARE_PROVIDER_SITE_OTHER): Payer: Medicare Other

## 2013-11-11 VITALS — BP 156/91 | HR 67 | Ht 69.0 in | Wt 265.0 lb

## 2013-11-11 DIAGNOSIS — E119 Type 2 diabetes mellitus without complications: Secondary | ICD-10-CM | POA: Insufficient documentation

## 2013-11-11 DIAGNOSIS — R5383 Other fatigue: Secondary | ICD-10-CM

## 2013-11-11 DIAGNOSIS — R0602 Shortness of breath: Secondary | ICD-10-CM

## 2013-11-11 DIAGNOSIS — R079 Chest pain, unspecified: Secondary | ICD-10-CM | POA: Diagnosis present

## 2013-11-11 DIAGNOSIS — I10 Essential (primary) hypertension: Secondary | ICD-10-CM | POA: Diagnosis not present

## 2013-11-11 DIAGNOSIS — R5381 Other malaise: Secondary | ICD-10-CM | POA: Insufficient documentation

## 2013-11-11 DIAGNOSIS — R0609 Other forms of dyspnea: Secondary | ICD-10-CM | POA: Insufficient documentation

## 2013-11-11 DIAGNOSIS — Z794 Long term (current) use of insulin: Secondary | ICD-10-CM | POA: Insufficient documentation

## 2013-11-11 DIAGNOSIS — I2510 Atherosclerotic heart disease of native coronary artery without angina pectoris: Secondary | ICD-10-CM

## 2013-11-11 DIAGNOSIS — E785 Hyperlipidemia, unspecified: Secondary | ICD-10-CM

## 2013-11-11 DIAGNOSIS — R0989 Other specified symptoms and signs involving the circulatory and respiratory systems: Secondary | ICD-10-CM | POA: Insufficient documentation

## 2013-11-11 LAB — LIPID PANEL
CHOLESTEROL: 131 mg/dL (ref 0–200)
HDL: 35.2 mg/dL — ABNORMAL LOW (ref >39.00)
LDL Cholesterol: 77 mg/dL (ref 0–99)
NONHDL: 95.8
Total CHOL/HDL Ratio: 4
Triglycerides: 96 mg/dL (ref 0.0–149.0)
VLDL: 19.2 mg/dL (ref 0.0–40.0)

## 2013-11-11 LAB — HEPATIC FUNCTION PANEL
ALK PHOS: 110 U/L (ref 39–117)
ALT: 19 U/L (ref 0–53)
AST: 18 U/L (ref 0–37)
Albumin: 3.9 g/dL (ref 3.5–5.2)
BILIRUBIN DIRECT: 0 mg/dL (ref 0.0–0.3)
BILIRUBIN TOTAL: 0.9 mg/dL (ref 0.2–1.2)
Total Protein: 7.5 g/dL (ref 6.0–8.3)

## 2013-11-11 MED ORDER — TECHNETIUM TC 99M SESTAMIBI GENERIC - CARDIOLITE
33.0000 | Freq: Once | INTRAVENOUS | Status: AC | PRN
  Administered 2013-11-11: 33 via INTRAVENOUS

## 2013-11-11 MED ORDER — TECHNETIUM TC 99M SESTAMIBI GENERIC - CARDIOLITE
11.0000 | Freq: Once | INTRAVENOUS | Status: AC | PRN
  Administered 2013-11-11: 11 via INTRAVENOUS

## 2013-11-11 NOTE — Telephone Encounter (Signed)
Per dr Jens Som pts nuclear test is abnormal. Spoke with pt, he is aware the test is abnormal. Pt instructed to lay low this weekend and if he has chest pain he is to go to the ER. Pt told dr Mayford Knife will look at these results and he will be called next week with the next step. Patient voiced understanding

## 2013-11-11 NOTE — Progress Notes (Signed)
[facility] SITE 3 NUCLEAR MED [address] [place], [place] [address] [phone]    Cardiology Nuclear Med Study  James [name] is a 68 y.o. male     MRN : [id number]     DOB: Feb 16, 1946  Procedure Date: 11/11/2013  Nuclear Med Background Indication for Stress Test:  Evaluation for Ischemia History:  CAD; 2013 MPI-ischemia, EF 42% Cardiac Risk Factors: Hypertension and IDDM Type 2  Symptoms:  Chest Pain, DOE and Fatigue with Exertion   Nuclear Pre-Procedure Caffeine/Decaff Intake:  None NPO After: 11:00pm   Lungs:  clear O2 Sat: 95% on room air. IV 0.9% NS with Angio Cath:  22g  IV Site: R Hand  IV Started by:  Frederick Peers, EMT-P  Chest Size (in):  50 Cup Size: n/a  Height:  (1.753 m)  Weight:  265 lb (120.203 kg)  BMI:  Body mass index is 39.12 kg/(m^2). Tech Comments:  Held toprol 24 hrs    Nuclear Med Study 1 or 2 day study: 1 day  Stress Test Type:  Stress  Reading MD: Olga Millers, MD  Order Authorizing Provider:  Armanda Magic, MD  Resting Radionuclide: Technetium 64m Sestamibi  Resting Radionuclide Dose: 11.0 mCi   Stress Radionuclide:  Technetium 57m Sestamibi  Stress Radionuclide Dose: 33.0 mCi           Stress Protocol Rest HR: 67 Stress HR: 129  Rest BP: 156/91 Stress BP: 177/102  Exercise Time (min): 6:30 METS: 7.0           Dose of Adenosine (mg):  n/a Dose of Lexiscan: n/a mg  Dose of Atropine (mg): n/a Dose of Dobutamine: n/a mcg/kg/min (at max HR)  Stress Test Technologist: Nelson Chimes, BS-ES  Nuclear Technologist:  Kerby Nora, CNMT     Rest Procedure:  Myocardial perfusion imaging was performed at rest 45 minutes following the intravenous administration of Technetium 37m Sestamibi. Rest ECG: NSR - Normal EKG  Stress Procedure:  The patient exercised on the treadmill utilizing the Bruce Protocol for 6:30 minutes. The patient stopped due to fatigue and denied any chest pain.  Technetium 59m Sestamibi was injected at  peak exercise and myocardial perfusion imaging was performed after a brief delay. Stress ECG: No significant ST segment change suggestive of ischemia.  QPS Raw Data Images:  Acquisition technically good; LVE. Stress Images:  There is decreased uptake in the anterior wall. Rest Images:  Normal homogeneous uptake in all areas of the myocardium. Subtraction (SDS):  These findings are consistent with ischemia. Transient Ischemic Dilatation (Normal <1.22):  1.03 Lung/Heart Ratio (Normal <0.45):  0.49  Quantitative Gated Spect Images QGS EDV:  140 ml QGS ESV:  80 ml  Impression Exercise Capacity:  Fair exercise capacity. BP Response:  Hypotensive blood pressure response. Clinical Symptoms:  No chest pain or dyspnea. ECG Impression:  No significant ST segment change suggestive of ischemia. Comparison with Prior Nuclear Study: Previous images not available but compared to report, anterior ischemia and TID unchanged; inferior ischemia now absent.  Overall Impression:  High risk stress nuclear study with a moderate size, medium intensity, reversible anterior defect consistent with moderate ischemia; also note possible transitent ischemic dilatation of LV cavity and hypotensive BP response. Patient had already left office when study available to be read. Patient notified by Deliah Goody and instructed to go to ER if develops CP over weekend. Copy forwarded to Dr Mayford Knife. Will most likely need cath.  LV Ejection Fraction: 43%.  LV  Wall Motion:  Global hypokinesis.  Olga Millers

## 2013-11-13 ENCOUNTER — Telehealth: Payer: Self-pay | Admitting: Cardiology

## 2013-11-13 NOTE — Telephone Encounter (Signed)
Please let patient know that stress test was abnormal and please have him come in to see me in the office at 2pm on Monday 9/28 to discuss cath

## 2013-11-14 ENCOUNTER — Ambulatory Visit (INDEPENDENT_AMBULATORY_CARE_PROVIDER_SITE_OTHER): Payer: Medicare Other | Admitting: Cardiology

## 2013-11-14 ENCOUNTER — Encounter: Payer: Self-pay | Admitting: General Surgery

## 2013-11-14 ENCOUNTER — Other Ambulatory Visit: Payer: Self-pay | Admitting: Cardiology

## 2013-11-14 ENCOUNTER — Encounter: Payer: Self-pay | Admitting: Cardiology

## 2013-11-14 ENCOUNTER — Encounter (HOSPITAL_COMMUNITY): Payer: Self-pay | Admitting: Pharmacy Technician

## 2013-11-14 ENCOUNTER — Telehealth: Payer: Self-pay | Admitting: Cardiology

## 2013-11-14 VITALS — BP 148/70 | HR 82 | Ht 69.0 in | Wt 266.0 lb

## 2013-11-14 DIAGNOSIS — E785 Hyperlipidemia, unspecified: Secondary | ICD-10-CM

## 2013-11-14 DIAGNOSIS — R9439 Abnormal result of other cardiovascular function study: Secondary | ICD-10-CM

## 2013-11-14 DIAGNOSIS — I10 Essential (primary) hypertension: Secondary | ICD-10-CM

## 2013-11-14 DIAGNOSIS — R931 Abnormal findings on diagnostic imaging of heart and coronary circulation: Secondary | ICD-10-CM

## 2013-11-14 DIAGNOSIS — I25709 Atherosclerosis of coronary artery bypass graft(s), unspecified, with unspecified angina pectoris: Secondary | ICD-10-CM

## 2013-11-14 DIAGNOSIS — I2510 Atherosclerotic heart disease of native coronary artery without angina pectoris: Secondary | ICD-10-CM

## 2013-11-14 DIAGNOSIS — I5032 Chronic diastolic (congestive) heart failure: Secondary | ICD-10-CM

## 2013-11-14 DIAGNOSIS — Z5181 Encounter for therapeutic drug level monitoring: Secondary | ICD-10-CM

## 2013-11-14 HISTORY — DX: Abnormal findings on diagnostic imaging of heart and coronary circulation: R93.1

## 2013-11-14 LAB — CBC WITH DIFFERENTIAL/PLATELET
Basophils Absolute: 0 10*3/uL (ref 0.0–0.1)
Basophils Relative: 0.4 % (ref 0.0–3.0)
EOS ABS: 0.2 10*3/uL (ref 0.0–0.7)
Eosinophils Relative: 1.8 % (ref 0.0–5.0)
HCT: 42.4 % (ref 39.0–52.0)
Hemoglobin: 14 g/dL (ref 13.0–17.0)
LYMPHS PCT: 11.4 % — AB (ref 12.0–46.0)
Lymphs Abs: 1 10*3/uL (ref 0.7–4.0)
MCHC: 33 g/dL (ref 30.0–36.0)
MCV: 86.3 fl (ref 78.0–100.0)
MONOS PCT: 7.6 % (ref 3.0–12.0)
Monocytes Absolute: 0.7 10*3/uL (ref 0.1–1.0)
NEUTROS ABS: 7.2 10*3/uL (ref 1.4–7.7)
Neutrophils Relative %: 78.8 % — ABNORMAL HIGH (ref 43.0–77.0)
PLATELETS: 278 10*3/uL (ref 150.0–400.0)
RBC: 4.91 Mil/uL (ref 4.22–5.81)
RDW: 13.9 % (ref 11.5–15.5)
WBC: 9.1 10*3/uL (ref 4.0–10.5)

## 2013-11-14 LAB — PROTIME-INR
INR: 1.1 ratio — AB (ref 0.8–1.0)
Prothrombin Time: 12.1 s (ref 9.6–13.1)

## 2013-11-14 LAB — BASIC METABOLIC PANEL
BUN: 13 mg/dL (ref 6–23)
CALCIUM: 8.7 mg/dL (ref 8.4–10.5)
CO2: 26 meq/L (ref 19–32)
CREATININE: 1.2 mg/dL (ref 0.4–1.5)
Chloride: 100 mEq/L (ref 96–112)
GFR: 66.46 mL/min (ref >60.00)
Glucose, Bld: 271 mg/dL — ABNORMAL HIGH (ref 70–99)
Potassium: 3.5 mEq/L (ref 3.5–5.1)
Sodium: 133 mEq/L — ABNORMAL LOW (ref 135–145)

## 2013-11-14 NOTE — Patient Instructions (Addendum)
Your physician recommends that you continue on your current medications as directed. Please refer to the Current Medication list given to you today.   Your physician recommends that you go to the lab today for a BMET, CBC w/Diff, and PT/INR  Your physician has requested that you have a cardiac catheterization. Cardiac catheterization is used to diagnose and/or treat various heart conditions. Doctors may recommend this procedure for a number of different reasons. The most common reason is to evaluate chest pain. Chest pain can be a symptom of coronary artery disease (CAD), and cardiac catheterization can show whether plaque is narrowing or blocking your heart's arteries. This procedure is also used to evaluate the valves, as well as measure the blood flow and oxygen levels in different parts of your heart. For further information please visit [email or web address] Please follow instruction sheet, as given. ( Scheduled with Dr Excell Seltzer on 11/16/13 at 10:00 am )

## 2013-11-14 NOTE — Telephone Encounter (Signed)
Pt is aware and agreed to see Dr Mayford Knife today at 2:00PM

## 2013-11-14 NOTE — Telephone Encounter (Signed)
Please let Dr. Nicholos Johns (pts PCP ) that we have to hold metformin for cath - BS is not well controlled - send labs from today and find out if he wants to adjust his insulin while off Metformin - he will have to be off of it for a total of 4 days

## 2013-11-14 NOTE — Progress Notes (Signed)
[address], [address] [place], [place]  [address] Phone: [phone] Fax:  [phone]  Date:  11/14/2013   ID:  James [name], James [name] 28-Mar-1945, MRN [id number]  PCP:  Lolita Patella, MD  Cardiologist:  Armanda Magic, MD    History of Present Illness: James [name] is a 68 y.o. male with a history of ASCAD, HTN, dyslipidemia and chronic diastolic CHF with chronic LE edema, who presents today for followup of recent nuclear stress test.  He recently saw me for complaints of DOE which has progressed after walking about 20 yards. He has chest pain once daily lasting about 1-2 minutes and resolves. He describes it as a pressure but has never taken any NTG. He also states that he has a lot of exertional fatigue. He underwent nuclear stress test which showed high risk stress nuclear study with a moderate size, medium intensity, reversible anterior defect consistent with moderate ischemia; also noted possible transient ischemic dilatation of LV cavity and hypotensive BP response. He now presents today for discussion of results.      Wt Readings from Last 3 Encounters:  11/11/13 265 lb (120.203 kg)  11/01/13 266 lb 12.8 oz (121.02 kg)  03/09/13 250 lb 6.4 oz (113.581 kg)     Past Medical History  Diagnosis Date  . Hypertension   . Dyslipidemia   . Deviated nasal septum   . GERD (gastroesophageal reflux disease)   . Diabetes mellitus     onset 2013  . Coronary artery disease 10/2011    s/p cabg  . Atrial flutter     Post-op with no reoccurence  . Dilated aortic root   . Diastolic dysfunction     grade II by echo 05/2012    Current Outpatient Prescriptions  Medication Sig Dispense Refill  . amLODipine (NORVASC) 5 MG tablet Take 10 mg by mouth daily.       Marland Kitchen aspirin 81 MG tablet Take 81 mg by mouth daily.      Marland Kitchen atorvastatin (LIPITOR) 80 MG tablet Take 80 mg by mouth daily.      . fish oil-omega-3 fatty acids 1000 MG capsule Take 2 g by mouth 2 (two) times daily.       .  Insulin Lispro Prot & Lispro (HUMALOG MIX 75/25 KWIKPEN) (75-25) 100 UNIT/ML Kwikpen Inject 30 Units into the skin 2 (two) times daily before a meal.      . metFORMIN (GLUCOPHAGE) 500 MG tablet Take 1,000 mg by mouth 2 (two) times daily with a meal.      . metoprolol succinate (TOPROL-XL) 100 MG 24 hr tablet Take 1 tablet (100 mg total) by mouth daily. Take with or immediately following a meal.  30 tablet  11  . Multiple Vitamin (MULTIVITAMIN WITH MINERALS) TABS Take 1 tablet by mouth daily.      Marland Kitchen olmesartan (BENICAR) 40 MG tablet Take 40 mg by mouth daily.      Marland Kitchen omeprazole (PRILOSEC) 20 MG capsule Take 20 mg by mouth daily as needed. For acid reflux      . pramipexole (MIRAPEX) 0.75 MG tablet Take 0.75 mg by mouth See admin instructions. Takes 2 tabs in evening      . spironolactone (ALDACTONE) 25 MG tablet Take 25 mg by mouth daily.       No current facility-administered medications for this visit.    Allergies:    Allergies  Allergen Reactions  . Lisinopril Cough    Social History:  The patient  reports that he has never smoked. He has never used smokeless tobacco. He reports that he drinks alcohol. He reports that he does not use illicit drugs.   Family History:  The patient's family history includes Stroke in his father.   ROS:  Please see the history of present illness.      All other systems reviewed and negative.   PHYSICAL EXAM: VS:  There were no vitals taken for this visit. Well nourished, well developed, in no acute distress HEENT: normal Neck: no JVD Cardiac:  normal S1, S2; RRR; no murmur Lungs:  clear to auscultation bilaterally, no wheezing, rhonchi or rales Abd: soft, nontender, no hepatomegaly Ext: trace edema Skin: warm and dry Neuro:  CNs 2-12 intact, no focal abnormalities noted  ASSESSMENT AND PLAN:  1. ASCAD now with some angina on a daily basis. Recent myoview showed a anterolateral wall defect consistent with ischemia ? SVG to diag - continue  ASA/BB/statin  - I have reviewed the findings of the nuclear stress test and have recommended proceeding with left heart cath with angiography of grafts.  Cardiac catheterization was discussed with the patient fully including risks on myocardial infarction, death, stroke, bleeding, arrhythmia, dye allergy, renal insufficiency or bleeding.  All patient questions and concerns were discussed and the patient understands and is willing to proceed.   2.  HTN - borderline controlled on exam today. He has a history of white coat HTN and at home it runs 140/58mmHg - continue amlodipine/Toprol/Benicar  - check precath labs - hold aldactone and metformin for cath 3. Dyslipidemia - last LDL done 08/2012 was 88 - continue Lipitor  4. Chronic diastolic CHF - he has some mild edema on exam     Signed, Armanda Magic, MD [facility] 11/14/2013 2:47 PM

## 2013-11-14 NOTE — Telephone Encounter (Signed)
Pt has to pick a child up from daycare at 2. Asked to be scheduled at 2:30.

## 2013-11-15 ENCOUNTER — Telehealth: Payer: Self-pay | Admitting: Cardiology

## 2013-11-15 ENCOUNTER — Encounter: Payer: Self-pay | Admitting: General Surgery

## 2013-11-15 NOTE — Telephone Encounter (Signed)
Spoke with pts and answered his question regarding why Dr Mayford Knifeurner was not the one doing his cath tomorrow.

## 2013-11-15 NOTE — Telephone Encounter (Signed)
Pt is aware to be off metformin and aldactone prior to cath, We had him hold yesterday. Forwarded to PCP to make aware.

## 2013-11-15 NOTE — Telephone Encounter (Signed)
New message    Patient has questions regarding upcoming procedure on tomorrow.

## 2013-11-16 ENCOUNTER — Encounter (HOSPITAL_COMMUNITY)
Admission: RE | Disposition: A | Discharge status: Home / Self Care | Payer: Self-pay | Source: Ambulatory Visit | Attending: Cardiovascular Disease

## 2013-11-16 ENCOUNTER — Ambulatory Visit (HOSPITAL_COMMUNITY)
Admission: RE | Admit: 2013-11-16 | Discharge: 2013-11-16 | Disposition: A | Discharge status: Home / Self Care | Payer: Medicare Other | Source: Ambulatory Visit | Attending: Cardiovascular Disease | Admitting: Cardiovascular Disease

## 2013-11-16 DIAGNOSIS — I509 Heart failure, unspecified: Secondary | ICD-10-CM | POA: Insufficient documentation

## 2013-11-16 DIAGNOSIS — Z7982 Long term (current) use of aspirin: Secondary | ICD-10-CM | POA: Diagnosis not present

## 2013-11-16 DIAGNOSIS — I2510 Atherosclerotic heart disease of native coronary artery without angina pectoris: Secondary | ICD-10-CM | POA: Insufficient documentation

## 2013-11-16 DIAGNOSIS — E119 Type 2 diabetes mellitus without complications: Secondary | ICD-10-CM | POA: Insufficient documentation

## 2013-11-16 DIAGNOSIS — I5032 Chronic diastolic (congestive) heart failure: Secondary | ICD-10-CM | POA: Diagnosis not present

## 2013-11-16 DIAGNOSIS — K219 Gastro-esophageal reflux disease without esophagitis: Secondary | ICD-10-CM | POA: Diagnosis not present

## 2013-11-16 DIAGNOSIS — I10 Essential (primary) hypertension: Secondary | ICD-10-CM | POA: Insufficient documentation

## 2013-11-16 DIAGNOSIS — E785 Hyperlipidemia, unspecified: Secondary | ICD-10-CM | POA: Diagnosis not present

## 2013-11-16 DIAGNOSIS — I25810 Atherosclerosis of coronary artery bypass graft(s) without angina pectoris: Secondary | ICD-10-CM | POA: Insufficient documentation

## 2013-11-16 DIAGNOSIS — Z79899 Other long term (current) drug therapy: Secondary | ICD-10-CM | POA: Diagnosis not present

## 2013-11-16 DIAGNOSIS — Z794 Long term (current) use of insulin: Secondary | ICD-10-CM | POA: Insufficient documentation

## 2013-11-16 DIAGNOSIS — I25709 Atherosclerosis of coronary artery bypass graft(s), unspecified, with unspecified angina pectoris: Secondary | ICD-10-CM

## 2013-11-16 HISTORY — PX: LEFT HEART CATHETERIZATION WITH CORONARY ANGIOGRAM: SHX5451

## 2013-11-16 LAB — GLUCOSE, CAPILLARY: Glucose-Capillary: 138 mg/dL — ABNORMAL HIGH (ref 70–99)

## 2013-11-16 SURGERY — LEFT HEART CATHETERIZATION WITH CORONARY ANGIOGRAM
Anesthesia: LOCAL

## 2013-11-16 MED ORDER — SODIUM CHLORIDE 0.9 % IV SOLN
INTRAVENOUS | Status: DC
  Administered 2013-11-16: 1000 mL via INTRAVENOUS

## 2013-11-16 MED ORDER — VERAPAMIL HCL 2.5 MG/ML IV SOLN
INTRAVENOUS | Status: AC
  Filled 2013-11-16: qty 2

## 2013-11-16 MED ORDER — SODIUM CHLORIDE 0.9 % IV SOLN
1.0000 mL/kg/h | INTRAVENOUS | Status: DC
Start: 2013-11-16 — End: 2013-11-16

## 2013-11-16 MED ORDER — SODIUM CHLORIDE 0.9 % IJ SOLN: 3.0000 mL | Freq: Two times a day (BID) | INTRAMUSCULAR | Status: DC

## 2013-11-16 MED ORDER — LIDOCAINE HCL (PF) 1 % IJ SOLN
INTRAMUSCULAR | Status: AC
Start: 2013-11-16 — End: 2013-11-16
  Filled 2013-11-16: qty 30

## 2013-11-16 MED ORDER — ONDANSETRON HCL 4 MG/2ML IJ SOLN: 4.0000 mg | Freq: Four times a day (QID) | INTRAMUSCULAR | Status: DC | PRN

## 2013-11-16 MED ORDER — NITROGLYCERIN 1 MG/10 ML FOR IR/CATH LAB
INTRA_ARTERIAL | Status: AC
  Filled 2013-11-16: qty 10

## 2013-11-16 MED ORDER — MIDAZOLAM HCL 2 MG/2ML IJ SOLN
INTRAMUSCULAR | Status: AC
  Filled 2013-11-16: qty 2

## 2013-11-16 MED ORDER — ASPIRIN 81 MG PO CHEW
CHEWABLE_TABLET | ORAL | Status: AC
  Filled 2013-11-16: qty 1

## 2013-11-16 MED ORDER — SODIUM CHLORIDE 0.9 % IJ SOLN: 3.0000 mL | INTRAMUSCULAR | Status: DC | PRN

## 2013-11-16 MED ORDER — FENTANYL CITRATE 0.05 MG/ML IJ SOLN
INTRAMUSCULAR | Status: AC
  Filled 2013-11-16: qty 2

## 2013-11-16 MED ORDER — SODIUM CHLORIDE 0.9 % IV SOLN: 250.0000 mL | INTRAVENOUS | Status: DC | PRN

## 2013-11-16 MED ORDER — ASPIRIN 81 MG PO CHEW
81.0000 mg | CHEWABLE_TABLET | ORAL | Status: AC
  Administered 2013-11-16: 81 mg via ORAL

## 2013-11-16 MED ORDER — ISOSORBIDE MONONITRATE ER 30 MG PO TB24: 30.0000 mg | ORAL_TABLET | Freq: Every day | ORAL | Status: DC

## 2013-11-16 MED ORDER — HEPARIN (PORCINE) IN NACL 2-0.9 UNIT/ML-% IJ SOLN
INTRAMUSCULAR | Status: AC
  Filled 2013-11-16: qty 1500

## 2013-11-16 MED ORDER — HEPARIN SODIUM (PORCINE) 1000 UNIT/ML IJ SOLN
INTRAMUSCULAR | Status: AC
  Filled 2013-11-16: qty 1

## 2013-11-16 MED ORDER — ACETAMINOPHEN 325 MG PO TABS: 650.0000 mg | ORAL_TABLET | ORAL | Status: DC | PRN

## 2013-11-16 NOTE — H&P (View-Only) (Signed)
[address], [address] [place], [place]  [address] Phone: [phone] Fax:  [phone]  Date:  11/14/2013   ID:  Destyn, Schuyler 28-Mar-1945, MRN [id number]  PCP:  Lolita Patella, MD  Cardiologist:  Armanda Magic, MD    History of Present Illness: James [name] is a 68 y.o. male with a history of ASCAD, HTN, dyslipidemia and chronic diastolic CHF with chronic LE edema, who presents today for followup of recent nuclear stress test.  He recently saw me for complaints of DOE which has progressed after walking about 20 yards. He has chest pain once daily lasting about 1-2 minutes and resolves. He describes it as a pressure but has never taken any NTG. He also states that he has a lot of exertional fatigue. He underwent nuclear stress test which showed high risk stress nuclear study with a moderate size, medium intensity, reversible anterior defect consistent with moderate ischemia; also noted possible transient ischemic dilatation of LV cavity and hypotensive BP response. He now presents today for discussion of results.      Wt Readings from Last 3 Encounters:  11/11/13 265 lb (120.203 kg)  11/01/13 266 lb 12.8 oz (121.02 kg)  03/09/13 250 lb 6.4 oz (113.581 kg)     Past Medical History  Diagnosis Date  . Hypertension   . Dyslipidemia   . Deviated nasal septum   . GERD (gastroesophageal reflux disease)   . Diabetes mellitus     onset 2013  . Coronary artery disease 10/2011    s/p cabg  . Atrial flutter     Post-op with no reoccurence  . Dilated aortic root   . Diastolic dysfunction     grade II by echo 05/2012    Current Outpatient Prescriptions  Medication Sig Dispense Refill  . amLODipine (NORVASC) 5 MG tablet Take 10 mg by mouth daily.       Marland Kitchen aspirin 81 MG tablet Take 81 mg by mouth daily.      Marland Kitchen atorvastatin (LIPITOR) 80 MG tablet Take 80 mg by mouth daily.      . fish oil-omega-3 fatty acids 1000 MG capsule Take 2 g by mouth 2 (two) times daily.       .  Insulin Lispro Prot & Lispro (HUMALOG MIX 75/25 KWIKPEN) (75-25) 100 UNIT/ML Kwikpen Inject 30 Units into the skin 2 (two) times daily before a meal.      . metFORMIN (GLUCOPHAGE) 500 MG tablet Take 1,000 mg by mouth 2 (two) times daily with a meal.      . metoprolol succinate (TOPROL-XL) 100 MG 24 hr tablet Take 1 tablet (100 mg total) by mouth daily. Take with or immediately following a meal.  30 tablet  11  . Multiple Vitamin (MULTIVITAMIN WITH MINERALS) TABS Take 1 tablet by mouth daily.      Marland Kitchen olmesartan (BENICAR) 40 MG tablet Take 40 mg by mouth daily.      Marland Kitchen omeprazole (PRILOSEC) 20 MG capsule Take 20 mg by mouth daily as needed. For acid reflux      . pramipexole (MIRAPEX) 0.75 MG tablet Take 0.75 mg by mouth See admin instructions. Takes 2 tabs in evening      . spironolactone (ALDACTONE) 25 MG tablet Take 25 mg by mouth daily.       No current facility-administered medications for this visit.    Allergies:    Allergies  Allergen Reactions  . Lisinopril Cough    Social History:  The patient  reports that he has never smoked. He has never used smokeless tobacco. He reports that he drinks alcohol. He reports that he does not use illicit drugs.   Family History:  The patient's family history includes Stroke in his father.   ROS:  Please see the history of present illness.      All other systems reviewed and negative.   PHYSICAL EXAM: VS:  There were no vitals taken for this visit. Well nourished, well developed, in no acute distress HEENT: normal Neck: no JVD Cardiac:  normal S1, S2; RRR; no murmur Lungs:  clear to auscultation bilaterally, no wheezing, rhonchi or rales Abd: soft, nontender, no hepatomegaly Ext: trace edema Skin: warm and dry Neuro:  CNs 2-12 intact, no focal abnormalities noted  ASSESSMENT AND PLAN:  1. ASCAD now with some angina on a daily basis. Recent myoview showed a anterolateral wall defect consistent with ischemia ? SVG to diag - continue  ASA/BB/statin  - I have reviewed the findings of the nuclear stress test and have recommended proceeding with left heart cath with angiography of grafts.  Cardiac catheterization was discussed with the patient fully including risks on myocardial infarction, death, stroke, bleeding, arrhythmia, dye allergy, renal insufficiency or bleeding.  All patient questions and concerns were discussed and the patient understands and is willing to proceed.   2.  HTN - borderline controlled on exam today. He has a history of white coat HTN and at home it runs 140/3888mmHg - continue amlodipine/Toprol/Benicar  - check precath labs - hold aldactone and metformin for cath 3. Dyslipidemia - last LDL done 08/2012 was 88 - continue Lipitor  4. Chronic diastolic CHF - he has some mild edema on exam     Signed, Armanda Magicraci Chaunda Vandergriff, MD [facility] 11/14/2013 2:47 PM

## 2013-11-16 NOTE — CV Procedure (Signed)
    Cardiac Catheterization Procedure Note  Name: Loma Sendererry W Volante MRN: [id number] DOB: 08/30/1945  Procedure: Left Heart Cath, Selective Coronary Angiography, LV angiography, LIMA angiography, SVG angiography  Indication: CAD s/p CABG, exertional dyspnea and chest pain, high-risk Myoview   Procedural Details: The left wrist was prepped, draped, and anesthetized with 1% lidocaine. Using the modified Seldinger technique, a 5/6 French Slender sheath was introduced into the left radial artery. 3 mg of verapamil was administered through the sheath, weight-based unfractionated heparin was administered intravenously. Standard Judkins catheters were used for selective coronary angiography and left ventriculography. A multipurpose catheter was used for angiography of the saphenous vein graft to PDA. The JR 4 catheter was used for diagonal and OM vein graft angiography as well as LIMA angiography. A multipurpose catheter was also used for a hand injection left ventriculogram to Catheter exchanges were performed over an exchange length guidewire. There were no immediate procedural complications. A TR band was used for radial hemostasis at the completion of the procedure.  The patient was transferred to the post catheterization recovery area for further monitoring.  Procedural Findings: Hemodynamics: AO 128/68 with a mean of 91 LV 133/8  Coronary angiography: Coronary dominance: right  Left mainstem: The left main is patent with moderate calcification and 50% distal left main stenosis  Left anterior descending (LAD): The proximal LAD has 90% stenosis followed by total occlusion of the first septal perforator.  Left circumflex (LCx): The left circumflex is patent. The proximal vessel has 50% stenosis. The mid vessel has 75% stenosis. The obtuse marginal branch fills from competitive bypass graft to  Right coronary artery (RCA): This is a large, dominant vessel. The proximal vessel just beyond the ostium  has 75% stenosis. The distal vessel has 95% stenosis. The PLA branch are patent. The PDA branch fills competitively from the vein graft to PDA.  LIMA to LAD: Widely patent throughout.  SVG to diagonal: 100% occlusion at the aortic anastomosis  SVG to OM1: Widely patent with no obstruction. This supplies the first OM and distal AV circumflex. The proximal circumflex fills retrograde.  Saphenous vein graft to PDA: Widely patent throughout with no obstruction.  Left ventriculography: Left ventricular systolic function is normal, LVEF is estimated at 55%, there is no significant mitral regurgitation   Estimated Blood Loss: Minimal  Final Conclusions:   1. Severe three-vessel coronary artery disease 2. Status post CABG with continued patency of the LIMA to LAD, saphenous vein graft obtuse marginal, and saphenous vein graft to PDA 3. Occlusion of the saphenous vein graft to first diagonal 4. Normal LV systolic function   Recommendations: The patient's native diagonal is very small in caliber. I do not think it is well suited for PCI. Would recommend ongoing medical therapy. The other potential area of ischemia is from the right posterolateral branches. The native RCA would be amenable to PCI and this would establish flow in the PLA branches. However, this may jeopardize the vein graft to PDA. Will review films with Dr. Mayford Knifeurner but would lean towards medical therapy at this point.  Tonny BollmanMichael Even Budlong MD, [facility] 11/16/2013, 11:28 AM

## 2013-11-16 NOTE — Progress Notes (Signed)
Janne NapoleonJennifer O'Neal, RN aware of EKG.  She will report findings to Dr Excell Seltzerooper and advise of any new orders.  Patient denies pain, shortness of breath. Color good, skin warm and dry.

## 2013-11-16 NOTE — Interval H&P Note (Signed)
History and Physical Interval Note:  11/16/2013 10:40 AM  Loma Sendererry W Labree  has presented today for surgery, with the diagnosis of abnormal stress  The various methods of treatment have been discussed with the patient and family. After consideration of risks, benefits and other options for treatment, the patient has consented to  Procedure(s): LEFT HEART CATHETERIZATION WITH CORONARY ANGIOGRAM (N/A) as a surgical intervention .  The patient's history has been reviewed, patient examined, no change in status, stable for surgery.  I have reviewed the patient's chart and labs.  Questions were answered to the patient's satisfaction.    Cath Lab Visit (complete for each Cath Lab visit)  Clinical Evaluation Leading to the Procedure:   ACS: No.  Non-ACS:    Anginal Classification: CCS III  Anti-ischemic medical therapy: Minimal Therapy (1 class of medications)  Non-Invasive Test Results: High-risk stress test findings: cardiac mortality >3%/year  Prior CABG: Previous CABG       Tonny BollmanMichael Babe Anthis

## 2013-11-16 NOTE — Discharge Instructions (Signed)
**  You may resume metformin 48 hours after your catheterization procedure**  **We are starting you on a long-acting form of nitroglycerin called Imdur. Take this once daily.**  **We will arrange follow-up with Dr Mayford Knifeurner**  Radial Site Care Refer to this sheet in the next few weeks. These instructions provide you with information on caring for yourself after your procedure. Your caregiver may also give you more specific instructions. Your treatment has been planned according to current medical practices, but problems sometimes occur. Call your caregiver if you have any problems or questions after your procedure. HOME CARE INSTRUCTIONS  You may shower the day after the procedure.Remove the bandage (dressing) and gently wash the site with plain soap and water.Gently pat the site dry.  Do not apply powder or lotion to the site.  Do not submerge the affected site in water for 3 to 5 days.  Inspect the site at least twice daily.  Do not flex or bend the affected arm for 24 hours.  No lifting over 5 pounds (2.3 kg) for 5 days after your procedure.  Do not drive home if you are discharged the same day of the procedure. Have someone else drive you.  You may drive 24 hours after the procedure unless otherwise instructed by your caregiver.  Do not operate machinery or power tools for 24 hours.  A responsible adult should be with you for the first 24 hours after you arrive home. What to expect:  Any bruising will usually fade within 1 to 2 weeks.  Blood that collects in the tissue (hematoma) may be painful to the touch. It should usually decrease in size and tenderness within 1 to 2 weeks. SEEK IMMEDIATE MEDICAL CARE IF:  You have unusual pain at the radial site.  You have redness, warmth, swelling, or pain at the radial site.  You have drainage (other than a small amount of blood on the dressing).  You have chills.  You have a fever or persistent symptoms for more than 72  hours.  You have a fever and your symptoms suddenly get worse.  Your arm becomes pale, cool, tingly, or numb.  You have heavy bleeding from the site. Hold pressure on the site. Document Released: 03/08/2010 Document Revised: 04/28/2011 Document Reviewed: 03/08/2010 [facility] Patient Information 2015 [employer] [place]. This information is not intended to replace advice given to you by your health care provider. Make sure you discuss any questions you have with your health care provider.

## 2013-11-17 ENCOUNTER — Telehealth: Payer: Self-pay | Admitting: Cardiology

## 2013-11-17 NOTE — Telephone Encounter (Signed)
New message     Pt had a cath yesterday.  The site at his wrist is swollen.  He had the pharmacist look at it today and they suggested he call the doctor.  Please call

## 2013-11-17 NOTE — Telephone Encounter (Signed)
Have patient come in to office to be seen tomorrow

## 2013-11-17 NOTE — Telephone Encounter (Signed)
Pt is set to see PA Dayna Dunn tomorrow 11/18/13 at 10:15/ Pt is aware and agreed

## 2013-11-17 NOTE — Telephone Encounter (Signed)
Called pt to get more information regarding the cath site. Pt states that it is swollen a little. He described it as being like a little pop up under the skin. No blood or bruising. TO Dr Mayford Knifeurner to advise.

## 2013-11-18 ENCOUNTER — Ambulatory Visit: Payer: Medicare Other | Admitting: Physician Assistant

## 2013-11-18 ENCOUNTER — Telehealth: Payer: Self-pay | Admitting: Cardiology

## 2013-11-25 ENCOUNTER — Telehealth: Payer: Self-pay | Admitting: Cardiology

## 2013-11-25 DIAGNOSIS — E785 Hyperlipidemia, unspecified: Secondary | ICD-10-CM

## 2013-11-25 MED ORDER — EZETIMIBE 10 MG PO TABS: 10.0000 mg | ORAL_TABLET | Freq: Every day | ORAL | Status: DC

## 2013-11-25 NOTE — Telephone Encounter (Signed)
Spoke with pt regarding test results °

## 2013-11-25 NOTE — Telephone Encounter (Signed)
Follow Up   Pt is returning a call from earlier. States he was unable to recognize the name verbalized in the VM. Please call.

## 2013-11-25 NOTE — Addendum Note (Signed)
Addended byOrlene Plum: Katelin Kutsch H on: 11/25/2013 12:29 PM   Modules accepted: Orders

## 2013-11-25 NOTE — Telephone Encounter (Signed)
See lipid result note.

## 2013-11-25 NOTE — Telephone Encounter (Deleted)
Message copied by [name], [name] on Fri Nov 25, 2013 12:25 PM ------      Message from: [name], [name]      Created: Fri Nov 25, 2013 12:16 PM       lmtrc ------

## 2013-12-08 ENCOUNTER — Ambulatory Visit (INDEPENDENT_AMBULATORY_CARE_PROVIDER_SITE_OTHER): Payer: Medicare Other | Admitting: Physician Assistant

## 2013-12-08 ENCOUNTER — Encounter: Payer: Self-pay | Admitting: Physician Assistant

## 2013-12-08 ENCOUNTER — Ambulatory Visit
Admission: RE | Admit: 2013-12-08 | Discharge: 2013-12-08 | Disposition: A | Discharge status: Home / Self Care | Payer: Medicare Other | Source: Ambulatory Visit | Attending: Physician Assistant | Admitting: Physician Assistant

## 2013-12-08 VITALS — BP 188/78 | HR 72 | Ht 69.0 in | Wt 273.0 lb

## 2013-12-08 DIAGNOSIS — E785 Hyperlipidemia, unspecified: Secondary | ICD-10-CM

## 2013-12-08 DIAGNOSIS — R05 Cough: Secondary | ICD-10-CM

## 2013-12-08 DIAGNOSIS — IMO0002 Reserved for concepts with insufficient information to code with codable children: Secondary | ICD-10-CM

## 2013-12-08 DIAGNOSIS — E1165 Type 2 diabetes mellitus with hyperglycemia: Secondary | ICD-10-CM

## 2013-12-08 DIAGNOSIS — Z23 Encounter for immunization: Secondary | ICD-10-CM

## 2013-12-08 DIAGNOSIS — R0602 Shortness of breath: Secondary | ICD-10-CM

## 2013-12-08 DIAGNOSIS — E118 Type 2 diabetes mellitus with unspecified complications: Secondary | ICD-10-CM

## 2013-12-08 DIAGNOSIS — I5032 Chronic diastolic (congestive) heart failure: Secondary | ICD-10-CM

## 2013-12-08 DIAGNOSIS — I10 Essential (primary) hypertension: Secondary | ICD-10-CM

## 2013-12-08 DIAGNOSIS — I2510 Atherosclerotic heart disease of native coronary artery without angina pectoris: Secondary | ICD-10-CM

## 2013-12-08 DIAGNOSIS — R059 Cough, unspecified: Secondary | ICD-10-CM

## 2013-12-08 MED ORDER — ISOSORBIDE MONONITRATE ER 60 MG PO TB24: 60.0000 mg | ORAL_TABLET | Freq: Every day | ORAL | Status: DC

## 2013-12-08 NOTE — Progress Notes (Signed)
Cardiology Office Note   Date:  12/08/2013   ID:  James, [name] 1946/02/16, MRN [id number]  PCP:  James Boys, MD  Cardiologist:  Dr. Armanda Magic     History of Present Illness: James [name] is a 68 y.o. male with a hx of CAD s/p CABG, diastolic HF, HTN, HL.  He was recently seen by Dr. Mayford Knife for progressively worse DOE and occasional chest pain.  Myoview was obtained and this was high risk with anterior ischemia and possible TID.  LHC was arranged and demonstrated native 3 v CAD and 3/4 grafts patent.  The S-Dx was occluded and the native Dx was small and not amenable to PCI.  There was severe distal RCA disease and this was amenable to PCI to restore flow to the PLA vessels but this would jeopardize the S-PDA.  Med Rx was recommended.  He returns for FU.    He continues to note chest discomfort with more moderate to extreme activities.  He probably describes CCS Class 2-3 symptoms.  It is a mild discomfort (3/10).   He notes NYHA class 2-2b DOE.  Denies orthopnea, PND, edema.  Denies syncope.  BP is high today.  He had a a high salt meal before coming today.  He had bacon, grits, scrambled eggs.  This is not typical for him .  BP at home usually 140-150s.   Studies:  - LHC (9/15):  Dist LM 50, pLAD 90 then 100, pCFX 50, mCFX 75, oRCA 75, dRCA 95, L-LAD patent, S-Dx 100, S-OM1 patent, S-PDA patent, EF 55% >> native Dx small caliber and not well suited for PCI; native RCA amenable to PCI and would restore flow to PLA branches but would jeopardize SVG-RCA  -  Med Rx  - Echo (1/15):  Left ventricle: The cavity size was mildly dilated. Mild LVH. EF 55% to 60%. Wall motion was normal; Gr 1 diastolic dysfunction Left atrium: The atrium was mildly dilated.  Right atrium: The atrium was mildly to moderately dilated.  Aortic arch measures 4.8 cm (moderately dilated); suggest CTA or MRA to better assess.  - Nuclear (9/15):  High risk, Anterior ischemia; possible transitent ischemic  dilatation of LV cavity and hypotensive BP response. EF 43%.  - Carotid US (9/13):  No ICA stenosis  - cMRI (2/15):  Normal LV EF 54%, Mild BAE, Trileaflet Aortic Valve, Upper limits of normal ascending aorta 3.8 cm, Normal aortic arch 2.3 cm with bovine origin of left carotid   Recent Labs/Images:  Recent Labs  11/01/13 1507 11/11/13 0812 11/14/13 1512  NA 137  --  133*  K 3.6  --  3.5  BUN 18  --  13  CREATININE 1.2  --  1.2  ALT  --  19  --   HGB  --   --  14.0  LDLCALC  --  77  --   HDL  --  35.20*  --   PROBNP 33.0  --   --       Wt Readings from Last 3 Encounters:  12/08/13 273 lb (123.832 kg)  11/16/13 265 lb (120.203 kg)  11/16/13 265 lb (120.203 kg)     Past Medical History  Diagnosis Date  . Hypertension   . Dyslipidemia   . Deviated nasal septum   . GERD (gastroesophageal reflux disease)   . Diabetes mellitus     onset 2013  . Coronary artery disease 10/2011    s/p cabg  . Atrial  flutter     Post-op with no reoccurence  . Dilated aortic root   . Diastolic dysfunction     grade II by echo 05/2012    Current Outpatient Prescriptions  Medication Sig Dispense Refill  . amLODipine (NORVASC) 5 MG tablet Take 10 mg by mouth daily.       Marland Kitchen. aspirin 81 MG tablet Take 81 mg by mouth daily.      Marland Kitchen. atorvastatin (LIPITOR) 80 MG tablet Take 80 mg by mouth daily.      Marland Kitchen. ezetimibe (ZETIA) 10 MG tablet Take 1 tablet (10 mg total) by mouth daily.  30 tablet  6  . fish oil-omega-3 fatty acids 1000 MG capsule Take 2 g by mouth 2 (two) times daily.       . Insulin Lispro Prot & Lispro (HUMALOG MIX 75/25 KWIKPEN) (75-25) 100 UNIT/ML Kwikpen Inject 30 Units into the skin daily.       . isosorbide mononitrate (IMDUR) 30 MG 24 hr tablet Take 1 tablet (30 mg total) by mouth daily.  30 tablet  5  . metFORMIN (GLUCOPHAGE) 500 MG tablet Take 1,000 mg by mouth 2 (two) times daily with a meal.      . metoprolol succinate (TOPROL-XL) 100 MG 24 hr tablet Take 1 tablet (100 mg total)  by mouth daily. Take with or immediately following a meal.  30 tablet  11  . Multiple Vitamin (MULTIVITAMIN WITH MINERALS) TABS Take 1 tablet by mouth daily.      Marland Kitchen. olmesartan (BENICAR) 40 MG tablet Take 40 mg by mouth daily.      Marland Kitchen. omeprazole (PRILOSEC) 20 MG capsule Take 20 mg by mouth daily as needed. For acid reflux      . pramipexole (MIRAPEX) 0.75 MG tablet Take 1.5 mg by mouth at bedtime.       Marland Kitchen. spironolactone (ALDACTONE) 25 MG tablet Take 25 mg by mouth daily.       No current facility-administered medications for this visit.    Allergies:   Lisinopril   Social History:  The patient  reports that he has never smoked. He has never used smokeless tobacco. He reports that he drinks alcohol. He reports that he does not use illicit drugs.   Family History:  The patient's family history includes Hypertension in his father, mother, and paternal grandfather; Stroke in his father. There is no history of Heart attack.    ROS:  Please see the history of present illness.   He notes a cough x 3 mos with yellowish sputum.  It is improving.    All other systems reviewed and negative.    PHYSICAL EXAM: VS:  BP 188/78  Pulse 72  Ht 5\' 9"  (1.753 m)  Wt 273 lb (123.832 kg)  BMI 40.30 kg/m2 Well nourished, well developed, in no acute distress HEENT: normal Neck: I cannot appreciate JVD Cardiac:  normal S1, S2; RRR; no murmur Lungs:  Faint crackles at the L base, otherwise, clear to ausc, no wheezing or rhonchi. Abd: soft, nontender, no hepatomegaly Ext: 1+ bilateral LE edema Skin: warm and dry Neuro:  CNs 2-12 intact, no focal abnormalities noted  EKG:  NSR, HR 72, normal axis, j point elevation inf leads, no change from prior tracing       ASSESSMENT AND PLAN:  Coronary artery disease   -  As noted, he had an occluded S-Dx at his LHC and severe distal RCA disease.  The RCA is amenable to PCI but would jeopardize  the S-RCA.  Med Rx is planned.  His BP is uncontrolled.      -  Increase  Imdur to 60 QD.    -  Continue Amlodipine, statin, Zetia, beta blocker.    -  Consider Ranexa if symptoms continue despite controlled BP.  Shortness of breath -  He has chronic edema and his exam is difficult to determine a/c HF.      -  Check BNP, BMET.  Add Lasix if elevated BNP.  Cough -  Lung exam is abnormal. Obtain CXR.  Chronic diastolic heart failure -  Continue spironolactone.  Obtain BNP as noted.   Essential hypertension  -  BP uncontrolled.  He admits to a high salt meal this AM.  Consider increasing Toprol-XL if BP remains high or add Hydralazine.  Hyperlipidemia  -  Continue statin, Zetia.  DM (diabetes mellitus), type 2, uncontrolled with complications-  FU with PCP.   Disposition:   FU with Dr. Armanda Magicraci Turner 1 month.   Signed, Brynda RimScott Oryn Casanova, PA-C, MHS 12/08/2013 11:41 AM    [facility] [address], [place], [place]  [address] Phone: [phone]; Fax: [phone]

## 2013-12-08 NOTE — Patient Instructions (Signed)
LAB WORK TODAY; BMET, BNP  Your physician recommends that you schedule a follow-up appointment in: 1 MONTH WITH DR. Mayford KnifeURNER  YOU WILL NEED A CHEST X-RAY AT THE [facility] AT [address]  INCREASE IMDUR TO 60 MG DAILY; NEW RX SENT IN FOR THE 60 MG TABLET

## 2013-12-09 ENCOUNTER — Telehealth: Payer: Self-pay | Admitting: Physician Assistant

## 2013-12-09 DIAGNOSIS — R0602 Shortness of breath: Secondary | ICD-10-CM

## 2013-12-09 DIAGNOSIS — I5032 Chronic diastolic (congestive) heart failure: Secondary | ICD-10-CM

## 2013-12-09 LAB — BASIC METABOLIC PANEL
BUN: 16 mg/dL (ref 6–23)
CHLORIDE: 102 meq/L (ref 96–112)
CO2: 30 mEq/L (ref 19–32)
Calcium: 9 mg/dL (ref 8.4–10.5)
Creatinine, Ser: 1.1 mg/dL (ref 0.4–1.5)
GFR: 67.79 mL/min (ref >60.00)
Glucose, Bld: 222 mg/dL — ABNORMAL HIGH (ref 70–99)
Potassium: 4.4 mEq/L (ref 3.5–5.1)
Sodium: 137 mEq/L (ref 135–145)

## 2013-12-09 LAB — BRAIN NATRIURETIC PEPTIDE: PRO B NATRI PEPTIDE: 120 pg/mL — AB (ref 0.0–100.0)

## 2013-12-09 MED ORDER — FUROSEMIDE 20 MG PO TABS: 20.0000 mg | ORAL_TABLET | Freq: Every day | ORAL | Status: DC

## 2013-12-09 MED ORDER — POTASSIUM CHLORIDE ER 10 MEQ PO TBCR: 10.0000 meq | EXTENDED_RELEASE_TABLET | Freq: Every day | ORAL | Status: DC

## 2013-12-09 NOTE — Telephone Encounter (Signed)
Spoke with pt and reviewed chest X-ray results, lab results and instructions from [name], [place] with pt. [facility] [address] He will come in for lab work on December 16, 2013

## 2013-12-09 NOTE — Telephone Encounter (Signed)
New message      Want chest xray results

## 2013-12-16 ENCOUNTER — Other Ambulatory Visit: Payer: Self-pay | Admitting: *Deleted

## 2013-12-16 ENCOUNTER — Other Ambulatory Visit (INDEPENDENT_AMBULATORY_CARE_PROVIDER_SITE_OTHER): Payer: Medicare Other | Admitting: *Deleted

## 2013-12-16 DIAGNOSIS — R0602 Shortness of breath: Secondary | ICD-10-CM

## 2013-12-16 DIAGNOSIS — I5032 Chronic diastolic (congestive) heart failure: Secondary | ICD-10-CM

## 2013-12-16 DIAGNOSIS — E876 Hypokalemia: Secondary | ICD-10-CM

## 2013-12-16 LAB — BASIC METABOLIC PANEL
BUN: 18 mg/dL (ref 6–23)
CHLORIDE: 99 meq/L (ref 96–112)
CO2: 24 meq/L (ref 19–32)
Calcium: 9 mg/dL (ref 8.4–10.5)
Creatinine, Ser: 1.2 mg/dL (ref 0.4–1.5)
GFR: 64.51 mL/min (ref >60.00)
Glucose, Bld: 156 mg/dL — ABNORMAL HIGH (ref 70–99)
Potassium: 3.4 mEq/L — ABNORMAL LOW (ref 3.5–5.1)
SODIUM: 134 meq/L — AB (ref 135–145)

## 2013-12-16 LAB — BRAIN NATRIURETIC PEPTIDE: Pro B Natriuretic peptide (BNP): 23 pg/mL (ref 0.0–100.0)

## 2013-12-19 ENCOUNTER — Other Ambulatory Visit: Payer: Medicare Other

## 2013-12-23 ENCOUNTER — Telehealth: Payer: Self-pay | Admitting: *Deleted

## 2013-12-23 NOTE — Telephone Encounter (Signed)
I s/w about 11/2 for lab, he said he put it in his phone for the following week and said he was sorry. I asked pt to come in 11/9 for BMET. Pt again, said he was sorry, I said no problem and we will see him 11/9 for lab work.

## 2013-12-26 ENCOUNTER — Other Ambulatory Visit: Payer: Medicare Other

## 2013-12-27 ENCOUNTER — Other Ambulatory Visit: Payer: Medicare Other

## 2013-12-27 DIAGNOSIS — E876 Hypokalemia: Secondary | ICD-10-CM

## 2014-01-09 ENCOUNTER — Encounter: Payer: Self-pay | Admitting: Cardiology

## 2014-01-09 ENCOUNTER — Ambulatory Visit (INDEPENDENT_AMBULATORY_CARE_PROVIDER_SITE_OTHER): Payer: Medicare Other | Admitting: Cardiology

## 2014-01-09 VITALS — BP 142/84 | HR 66 | Ht 69.0 in | Wt 267.0 lb

## 2014-01-09 DIAGNOSIS — I10 Essential (primary) hypertension: Secondary | ICD-10-CM

## 2014-01-09 DIAGNOSIS — E785 Hyperlipidemia, unspecified: Secondary | ICD-10-CM

## 2014-01-09 DIAGNOSIS — I5032 Chronic diastolic (congestive) heart failure: Secondary | ICD-10-CM

## 2014-01-09 DIAGNOSIS — E1165 Type 2 diabetes mellitus with hyperglycemia: Secondary | ICD-10-CM

## 2014-01-09 DIAGNOSIS — IMO0002 Reserved for concepts with insufficient information to code with codable children: Secondary | ICD-10-CM

## 2014-01-09 DIAGNOSIS — I25119 Atherosclerotic heart disease of native coronary artery with unspecified angina pectoris: Secondary | ICD-10-CM

## 2014-01-09 DIAGNOSIS — R0602 Shortness of breath: Secondary | ICD-10-CM

## 2014-01-09 DIAGNOSIS — E118 Type 2 diabetes mellitus with unspecified complications: Secondary | ICD-10-CM

## 2014-01-09 DIAGNOSIS — I77810 Thoracic aortic ectasia: Secondary | ICD-10-CM

## 2014-01-09 MED ORDER — METOPROLOL SUCCINATE ER 25 MG PO TB24: 25.0000 mg | ORAL_TABLET | Freq: Every day | ORAL | Status: DC

## 2014-01-09 NOTE — Progress Notes (Signed)
[address] [place], [place]  [address] Phone: [phone] Fax:  [phone]  Date:  01/09/2014   ID:  James [name], DOB 07-Dec-1945, MRN [id number]  PCP:  [name], ROBERT L, MD  Cardiologist:  Armanda Magicraci Maranda Marte, MD    History of Present Illness: Loma Sendererry W Hand is a 68 y.o. male with a history of ASCAD, HTN, dyslipidemia and chronic diastolic CHF with chronic LE edema, who presents today for followup of recent nuclear stress test. He recently saw me for complaints of DOE which has progressed after walking about 20 yards. He has chest pain once daily lasting about 1-2 minutes and resolves. He describes it as a pressure but has never taken any NTG. He also states that he has a lot of exertional fatigue. He underwent nuclear stress test which showed high risk stress nuclear study with a moderate size, medium intensity, reversible anterior defect consistent with moderate ischemia; also noted possible transient ischemic dilatation of LV cavity and hypotensive BP response. He underwent cath showing native 3 v CAD and 3/4 grafts patent. The S-Dx was occluded and the native Dx was small and not amenable to PCI. There was severe distal RCA disease and this was amenable to PCI to restore flow to the PLA vessels but this would jeopardize the S-PDA. Med Rx was recommended. He was seen in followup last month by [name], PA and was complaining of continued angina with activities.  He has not been very compliant in the past with low sodium diet. He was also complaining of SOB and his BNP was normal.  His Imdur was increased to 60mg  daily.   He returns for FU.He denies any anginal pain but still gets worn out with exertion.  He has chronic DOE as well.    Wt Readings from Last 3 Encounters:  01/09/14 267 lb (121.11 kg)  12/08/13 273 lb (123.832 kg)  11/16/13 265 lb (120.203 kg)     Past Medical History  Diagnosis Date  . Hypertension   . Dyslipidemia   . Deviated nasal septum   . GERD  (gastroesophageal reflux disease)   . Diabetes mellitus     onset 2013  . Coronary artery disease 10/2011    s/p cabg  . Atrial flutter     Post-op with no reoccurence  . Dilated aortic root   . Diastolic dysfunction     grade II by echo 05/2012    Current Outpatient Prescriptions  Medication Sig Dispense Refill  . amLODipine (NORVASC) 5 MG tablet Take 10 mg by mouth daily.     Marland Kitchen. aspirin 81 MG tablet Take 81 mg by mouth daily.    Marland Kitchen. atorvastatin (LIPITOR) 80 MG tablet Take 80 mg by mouth daily.    Marland Kitchen. ezetimibe (ZETIA) 10 MG tablet Take 1 tablet (10 mg total) by mouth daily. 30 tablet 6  . fish oil-omega-3 fatty acids 1000 MG capsule Take 2 g by mouth 2 (two) times daily.     . furosemide (LASIX) 20 MG tablet Take 1 tablet (20 mg total) by mouth daily. 30 tablet 6  . Insulin Lispro Prot & Lispro (HUMALOG MIX 75/25 KWIKPEN) (75-25) 100 UNIT/ML Kwikpen Inject 30 Units into the skin daily.     . isosorbide mononitrate (IMDUR) 60 MG 24 hr tablet Take 1 tablet (60 mg total) by mouth daily. 30 tablet 11  . metFORMIN (GLUCOPHAGE) 500 MG tablet Take 1,000 mg by mouth 2 (two) times daily with a meal.    .  metoprolol succinate (TOPROL-XL) 100 MG 24 hr tablet Take 1 tablet (100 mg total) by mouth daily. Take with or immediately following a meal. 30 tablet 11  . Multiple Vitamin (MULTIVITAMIN WITH MINERALS) TABS Take 1 tablet by mouth daily.    Marland Kitchen. olmesartan (BENICAR) 40 MG tablet Take 40 mg by mouth daily.    Marland Kitchen. omeprazole (PRILOSEC) 20 MG capsule Take 20 mg by mouth daily as needed. For acid reflux    . potassium chloride (K-DUR) 10 MEQ tablet Take 1 tablet (10 mEq total) by mouth daily. 30 tablet 6  . pramipexole (MIRAPEX) 0.75 MG tablet Take 1.5 mg by mouth at bedtime.     Marland Kitchen. spironolactone (ALDACTONE) 25 MG tablet Take 25 mg by mouth daily.     No current facility-administered medications for this visit.    Allergies:    Allergies  Allergen Reactions  . Lisinopril Cough    Social History:   The patient  reports that he has never smoked. He has never used smokeless tobacco. He reports that he drinks alcohol. He reports that he does not use illicit drugs.   Family History:  The patient's family history includes Hypertension in his father, mother, and paternal grandfather; Stroke in his father. There is no history of Heart attack.   ROS:  Please see the history of present illness.      All other systems reviewed and negative.   PHYSICAL EXAM: VS:  BP 142/84 mmHg  Pulse 66  Ht 5\' 9"  (1.753 m)  Wt 267 lb (121.11 kg)  BMI 39.41 kg/m2  SpO2 94% Well nourished, well developed, in no acute distress HEENT: normal Neck: no JVD Cardiac:  normal S1, S2; RRR; no murmur Lungs:  clear to auscultation bilaterally, no wheezing, rhonchi or rales Abd: soft, nontender, no hepatomegaly Ext: no edema Skin: warm and dry Neuro:  CNs 2-12 intact, no focal abnormalities noted  ASSESSMENT AND PLAN:  Coronary artery disease - Severe 3 vessel native ASCAD with 3/4 grafts patent.  As noted, he had an occluded S-Dx at his LHC and severe distal RCA disease. The RCA is amenable to PCI but would jeopardize the S-RCA. Med Rx is planned. His BP is uncontrolled. He has not had any further CP since increasing the nitrates but still has some SOB.  - Continue Amlodipine, statin, Zetia, beta blocker, ASA and Imdur  Shortness of breath -BNP on last check was normal.  I think a lot of his SOB is due to obesity.  He has a lot of central obesity.  I have encouraged him to get back into the gym and start out slow exercising and follow a low fat diet.  Chronic diastolic heart failure - Continue spironolactone. Check BMET  Essential hypertension - BP still borderline controlled.  I have stressed the importance of following a strict 2gm sodium diet.  Increase Toprol to 125mg  daily.  I have asked him to check his BP daily for a week and call with results.    Hyperlipidemia - Continue statin, Zetia.   FLP in 9/15 showed an LDL of 77 and his goal is <16<70.  Zetia was added and he has repeat labs ordered in Jan.  DM (diabetes mellitus), type 2, uncontrolled with complications- FU with PCP.  Dilated aortic arch - repeat echo in 03/2104  Followup with [name] PA in 2 weeks  Disposition: FU with me in 3 months   Signed, Armanda Magicraci Simone Tuckey, MD [facility] 01/09/2014 11:09 AM

## 2014-01-09 NOTE — Patient Instructions (Addendum)
Your physician has recommended you make the following change in your medication: 1) INCREASE your Toprol to 125 mg daily.  Check your blood pressure daily for one week and call us with the results.  Your physician has requested that you have an echocardiogram in February 2016. Echocardiography is a painless test that uses sound waves to create images of your heart. It provides your doctor with information about the size and shape of your heart and how well your heart's chambers and valves are working. This procedure takes approximately one hour. There are no restrictions for this procedure.  Your physician recommends that you have lab work TODAY (BMET).  Your physician recommends that you schedule a follow-up appointment in: 2 weeks with [name].  Your physician recommends that you schedule a follow-up appointment in: 3 months with Dr. Mayford Knifeurner.

## 2014-01-10 LAB — BASIC METABOLIC PANEL
BUN: 19 mg/dL (ref 6–23)
CHLORIDE: 99 meq/L (ref 96–112)
CO2: 25 mEq/L (ref 19–32)
CREATININE: 1.3 mg/dL (ref 0.4–1.5)
Calcium: 9.6 mg/dL (ref 8.4–10.5)
GFR: 59.29 mL/min — AB (ref >60.00)
Glucose, Bld: 215 mg/dL — ABNORMAL HIGH (ref 70–99)
Potassium: 4.3 mEq/L (ref 3.5–5.1)
Sodium: 135 mEq/L (ref 135–145)

## 2014-01-26 ENCOUNTER — Encounter (HOSPITAL_COMMUNITY): Payer: Self-pay | Admitting: Cardiology

## 2014-01-30 ENCOUNTER — Encounter: Payer: Self-pay | Admitting: Physician Assistant

## 2014-01-30 ENCOUNTER — Ambulatory Visit (INDEPENDENT_AMBULATORY_CARE_PROVIDER_SITE_OTHER): Payer: Medicare Other | Admitting: Physician Assistant

## 2014-01-30 VITALS — BP 140/90 | HR 72 | Ht 69.0 in | Wt 267.0 lb

## 2014-01-30 DIAGNOSIS — E785 Hyperlipidemia, unspecified: Secondary | ICD-10-CM

## 2014-01-30 DIAGNOSIS — E118 Type 2 diabetes mellitus with unspecified complications: Secondary | ICD-10-CM

## 2014-01-30 DIAGNOSIS — I2510 Atherosclerotic heart disease of native coronary artery without angina pectoris: Secondary | ICD-10-CM

## 2014-01-30 DIAGNOSIS — IMO0002 Reserved for concepts with insufficient information to code with codable children: Secondary | ICD-10-CM

## 2014-01-30 DIAGNOSIS — I712 Thoracic aortic aneurysm, without rupture: Secondary | ICD-10-CM

## 2014-01-30 DIAGNOSIS — E1165 Type 2 diabetes mellitus with hyperglycemia: Secondary | ICD-10-CM

## 2014-01-30 DIAGNOSIS — I10 Essential (primary) hypertension: Secondary | ICD-10-CM

## 2014-01-30 DIAGNOSIS — I5032 Chronic diastolic (congestive) heart failure: Secondary | ICD-10-CM

## 2014-01-30 DIAGNOSIS — I7121 Aneurysm of the ascending aorta, without rupture: Secondary | ICD-10-CM

## 2014-01-30 NOTE — Progress Notes (Signed)
Cardiology Office Note   Date:  01/30/2014   ID:  James [name], DOB Oct 24, 1945, MRN [id number]  PCP:  [name], ROBERT L, MD  Cardiologist:  Dr. Armanda Magicraci Turner     History of Present Illness: James [name] is a 68 y.o. male with a hx of CAD s/p CABG, diastolic HF, HTN, HL. He was recently seen by Dr. Mayford Knifeurner for progressively worse DOE and occasional chest pain. Myoview was obtained and this was high risk with anterior ischemia and possible TID. LHC demonstrated native 3 v CAD and 3/4 grafts patent. The S-Dx was occluded and the native Dx was small and not amenable to PCI. There was severe distal RCA disease and this was amenable to PCI to restore flow to the PLA vessels but this would jeopardize the S-PDA. Med Rx was recommended.   Last seen by Dr. Armanda Magicraci Turner 01/09/14.  Toprol-XL was increased b/c of uncontrolled BP.  Chest pain was improved at that time.  He returns for FU.  He is doing well.  No further chest pain.  DOE is unchanged.  He notes significant nasal congestion from prior nasal fractures.  He denies orthopnea, PND.  LE edema is unchanged.  He denies syncope.  He has been under a great deal of stress.  His parents are getting put into a nursing home.  He checks his BP and it has been 120/80s at home.  He has been adherent to all of his medications.     Recent Labs: 11/11/2013: ALT 19; LDL (calc) 77 11/14/2013: Hemoglobin 14.0 12/16/2013: Pro B Natriuretic peptide (BNP) 23.0 01/09/2014: BUN 19; Creatinine 1.3; Potassium 4.3; Sodium 135    Recent Radiology: No results found.    Wt Readings from Last 3 Encounters:  01/30/14 267 lb (121.11 kg)  01/09/14 267 lb (121.11 kg)  12/08/13 273 lb (123.832 kg)     Past Medical History  Diagnosis Date  . Hypertension   . Dyslipidemia   . Deviated nasal septum   . GERD (gastroesophageal reflux disease)   . Diabetes mellitus     onset 2013  . Coronary artery disease 10/2011    a. s/p cabg;  b. Myoview (9/15):  ant  ischemia, poss TID, EF 43%, high risk >> LHC (9/15): Dist LM 50, pLAD 90 then 100, pCFX 50, mCFX 75, oRCA 75, dRCA 95, L-LAD patent, S-Dx 100, S-OM1 patent, S-PDA patent, EF 55% >> native Dx small caliber and not well suited for PCI; native RCA amenable to PCI and would restore flow to PLA branches but would jeopardize SVG-RCA - Med Rx  . Atrial flutter     Post-op with no reoccurence  . Chronic diastolic heart failure     Echo (1/15): Left ventricle: The cavity size was mildly dilated. Mild LVH. EF 55% to 60%. Wall motion was normal; Gr 1 diastolic dysfunction Left atrium: The atrium was mildly dilated. Right atrium: The atrium was mildly to moderately dilated. Aortic arch measures 4.8 cm (moderately dilated); suggest CTA or MRA to better assess.  . Ascending aortic aneurysm     cMRI (2/15): Normal LV EF 54%, Mild BAE, Trileaflet Aortic Valve, Upper limits of normal ascending aorta 3.8 cm, Normal aortic arch 2.3 cm with bovine origin of left carotid  . Hx of Doppler ultrasound     Carotid US (9/13): No ICA stenosis    Current Outpatient Prescriptions  Medication Sig Dispense Refill  . amLODipine (NORVASC) 5 MG tablet Take 10 mg by mouth daily.     .Marland Kitchen  aspirin 81 MG tablet Take 81 mg by mouth daily.    Marland Kitchen atorvastatin (LIPITOR) 80 MG tablet Take 80 mg by mouth daily.    . fish oil-omega-3 fatty acids 1000 MG capsule Take 2 g by mouth 2 (two) times daily.     . furosemide (LASIX) 20 MG tablet Take 1 tablet (20 mg total) by mouth daily. 30 tablet 6  . isosorbide mononitrate (IMDUR) 60 MG 24 hr tablet Take 1 tablet (60 mg total) by mouth daily. 30 tablet 11  . metFORMIN (GLUCOPHAGE) 500 MG tablet Take 1,000 mg by mouth 2 (two) times daily with a meal.    . metoprolol succinate (TOPROL-XL) 100 MG 24 hr tablet Take 1 tablet (100 mg total) by mouth daily. Take with or immediately following a meal. 30 tablet 11  . metoprolol succinate (TOPROL-XL) 25 MG 24 hr tablet Take 1 tablet (25 mg total) by  mouth daily. Take with or immediately following a meal. 30 tablet 11  . Multiple Vitamin (MULTIVITAMIN WITH MINERALS) TABS Take 1 tablet by mouth daily.    Marland Kitchen olmesartan (BENICAR) 40 MG tablet Take 40 mg by mouth daily.    Marland Kitchen omeprazole (PRILOSEC) 20 MG capsule Take 20 mg by mouth daily as needed. For acid reflux    . potassium chloride (K-DUR) 10 MEQ tablet Take 1 tablet (10 mEq total) by mouth daily. 30 tablet 6  . pramipexole (MIRAPEX) 0.75 MG tablet Take 1.5 mg by mouth at bedtime.     Marland Kitchen spironolactone (ALDACTONE) 25 MG tablet Take 25 mg by mouth daily.     No current facility-administered medications for this visit.     Allergies:   Lisinopril   Social History:  The patient  reports that he has never smoked. He has never used smokeless tobacco. He reports that he drinks alcohol. He reports that he does not use illicit drugs.   Family History:  The patient's family history includes Hypertension in his father, mother, and paternal grandfather; Stroke in his father. There is no history of Heart attack.    ROS:  Please see the history of present illness.      All other systems reviewed and negative.    PHYSICAL EXAM: VS:  BP 140/90 mmHg  Pulse 72  Ht 5\' 9"  (1.753 m)  Wt 267 lb (121.11 kg)  BMI 39.41 kg/m2  SpO2 94% Well nourished, well developed, in no acute distress HEENT: normal Neck: I cannot appreciate JVD Cardiac:  normal S1, S2;  RRR; no murmur   Lungs:   clear to auscultation bilaterally, no wheezing, rhonchi or rales Abd: soft, nontender, no hepatomegaly Ext: 1+bilateral LE edema Skin: warm and dry Neuro:  CNs 2-12 intact, no focal abnormalities noted   ASSESSMENT AND PLAN:   1.  Coronary Artery Disease:  No further angina since increasing nitrates. Recent LHC with 3/4 grafts patent with occluded S-Dx and severe dist RCA disease.  The RCA is amenable to PCI but would jeopardize the S-RCA.  Medical Rx is continued.      -  Continue Amlodipine, nitrates, beta blocker,  ASA, statin. 2.  Chronic Diastolic CHF:  Volume stable.   3.  Hypertension:  BP elevated today.  However, his BP at home is optimal.  He admits to increased stress today.  He will have his machine checked at his PCP's office to determine accuracy.    -  Check BP over 2 weeks and send to me.    -  If still  above goal, add Hydralazine. 4.  Hyperlipidemia:  He stopped Zetia due to $.  I will check to see if there is any assistance available.  Otherwise, [facility]    -  If LDL still too high, consider changing to Lipitor to Crestor. 5.  Diabetes Mellitus:  FU with PCP.  6.  Dilated Aortic Arch:   FU echo 03/2014.    Disposition:   FU with Dr. Armanda Magicraci Turner as planned.    Signed, Brynda RimScott Anjolina Byrer, PA-C, MHS 01/30/2014 9:36 AM    [facility] [address], [place], [place]  [address] Phone: [phone]; Fax: [phone]

## 2014-01-30 NOTE — Patient Instructions (Signed)
PLEASE CALL US WITH BP KoreaEADINGS AFTER 2 WEEKS TO SCOTT WEAVER, PAC [phone]  KEEP YOUR FOLLOW UP AS PLANNED  Your physician recommends that you continue on your current medications as directed. Please refer to the Current Medication list given to you today.

## 2014-02-06 ENCOUNTER — Other Ambulatory Visit: Payer: Self-pay | Admitting: Cardiology

## 2014-02-27 ENCOUNTER — Other Ambulatory Visit: Payer: Medicare Other

## 2014-02-27 DIAGNOSIS — E780 Pure hypercholesterolemia: Secondary | ICD-10-CM | POA: Diagnosis not present

## 2014-02-27 DIAGNOSIS — I25119 Atherosclerotic heart disease of native coronary artery with unspecified angina pectoris: Secondary | ICD-10-CM | POA: Diagnosis not present

## 2014-02-27 DIAGNOSIS — I130 Hypertensive heart and chronic kidney disease with heart failure and stage 1 through stage 4 chronic kidney disease, or unspecified chronic kidney disease: Secondary | ICD-10-CM | POA: Diagnosis not present

## 2014-02-27 DIAGNOSIS — N183 Chronic kidney disease, stage 3 (moderate): Secondary | ICD-10-CM | POA: Diagnosis not present

## 2014-02-27 DIAGNOSIS — E1121 Type 2 diabetes mellitus with diabetic nephropathy: Secondary | ICD-10-CM | POA: Diagnosis not present

## 2014-02-27 DIAGNOSIS — G2581 Restless legs syndrome: Secondary | ICD-10-CM | POA: Diagnosis not present

## 2014-02-27 DIAGNOSIS — L570 Actinic keratosis: Secondary | ICD-10-CM | POA: Diagnosis not present

## 2014-02-27 DIAGNOSIS — Z0001 Encounter for general adult medical examination with abnormal findings: Secondary | ICD-10-CM | POA: Diagnosis not present

## 2014-03-02 ENCOUNTER — Encounter (HOSPITAL_COMMUNITY): Payer: Self-pay | Admitting: Cardiology

## 2014-03-28 ENCOUNTER — Other Ambulatory Visit (HOSPITAL_COMMUNITY): Payer: Medicare Other

## 2014-04-11 ENCOUNTER — Other Ambulatory Visit (HOSPITAL_COMMUNITY): Payer: Commercial Managed Care - HMO | Admitting: *Deleted

## 2014-04-11 ENCOUNTER — Ambulatory Visit (HOSPITAL_COMMUNITY): Payer: Commercial Managed Care - HMO | Attending: Cardiology | Admitting: Radiology

## 2014-04-11 DIAGNOSIS — I10 Essential (primary) hypertension: Secondary | ICD-10-CM | POA: Diagnosis not present

## 2014-04-11 DIAGNOSIS — E119 Type 2 diabetes mellitus without complications: Secondary | ICD-10-CM | POA: Diagnosis not present

## 2014-04-11 DIAGNOSIS — E785 Hyperlipidemia, unspecified: Secondary | ICD-10-CM | POA: Insufficient documentation

## 2014-04-11 DIAGNOSIS — I77810 Thoracic aortic ectasia: Secondary | ICD-10-CM

## 2014-04-11 DIAGNOSIS — I712 Thoracic aortic aneurysm, without rupture: Secondary | ICD-10-CM

## 2014-04-11 DIAGNOSIS — E669 Obesity, unspecified: Secondary | ICD-10-CM | POA: Insufficient documentation

## 2014-04-11 MED ORDER — PERFLUTREN LIPID MICROSPHERE
1.0000 mL | INTRAVENOUS | Status: AC | PRN
  Administered 2014-04-11: 4 mL via INTRAVENOUS

## 2014-04-11 NOTE — Progress Notes (Signed)
Echocardiogram performed with Definity.  

## 2014-04-19 ENCOUNTER — Telehealth: Payer: Self-pay

## 2014-04-19 DIAGNOSIS — I77810 Thoracic aortic ectasia: Secondary | ICD-10-CM

## 2014-04-19 NOTE — Telephone Encounter (Signed)
Patient informed of results and verbal understanding expressed.  Repeat ECHO ordered to be scheduled in one year. 

## 2014-04-19 NOTE — Telephone Encounter (Signed)
-----   Message from Quintella Reichertraci R Turner, MD sent at 04/11/2014 11:46 AM EST ----- Please let patient know that echo showed normal LVF with mildly dilated ascending aorta, mild MVP.  Aortic dimensions stable - repeat echo in 1 year

## 2014-04-23 NOTE — Progress Notes (Signed)
Cardiology Office Note   Date:  04/24/2014   ID:  Vito, Beg 30-Mar-1945, MRN [id number]  PCP:  Lenora Boys, MD  Cardiologist:   Quintella Reichert, MD   Chief Complaint  Patient presents with  . Coronary Artery Disease  . Hypertension  . Hyperlipidemia      History of Present Illness: James [name] is a 69 y.o. male with a history of HTN, dyslipidemia and chronic diastolic CHF with chronic LE edema. He also has a history of ASCAD with last cath showing  native 3 v CAD and 3/4 grafts patent.The SVG-Dx was occluded and the native Dx was small and not amenable to PCI. There was severe distal RCA disease and this was amenable to PCI to restore flow to the PLA vessels but this would jeopardize the SVG-PDA. Med Rx was recommended.  He returns for FU.He denies any anginal pain. He has chronic DOE as well related to underlying obesity and has improved some.  He denies any dizziness, palpitations, LE edema, or syncope.  He has started exercising with a personal trainer twice weekly and on the other days does cardio.     Past Medical History  Diagnosis Date  . Hypertension   . Dyslipidemia   . Deviated nasal septum   . GERD (gastroesophageal reflux disease)   . Diabetes mellitus     onset 2013  . Coronary artery disease 10/2011    a. s/p cabg;  b. Myoview (9/15):  ant ischemia, poss TID, EF 43%, high risk >> LHC (9/15): Dist LM 50, pLAD 90 then 100, pCFX 50, mCFX 75, oRCA 75, dRCA 95, L-LAD patent, S-Dx 100, S-OM1 patent, S-PDA patent, EF 55% >> native Dx small caliber and not well suited for PCI; native RCA amenable to PCI and would restore flow to PLA branches but would jeopardize SVG-RCA - Med Rx  . Atrial flutter     Post-op with no reoccurence  . Chronic diastolic heart failure     Echo (1/15): Left ventricle: The cavity size was mildly dilated. Mild LVH. EF 55% to 60%. Wall motion was normal; Gr 1 diastolic dysfunction Left atrium: The atrium was mildly dilated.  Right atrium: The atrium was mildly to moderately dilated. Aortic arch measures 4.8 cm (moderately dilated); suggest CTA or MRA to better assess.  . Ascending aortic aneurysm     cMRI (2/15): Normal LV EF 54%, Mild BAE, Trileaflet Aortic Valve, Upper limits of normal ascending aorta 3.8 cm, Normal aortic arch 2.3 cm with bovine origin of left carotid  . Hx of Doppler ultrasound     Carotid US (9/13): No ICA stenosis    Past Surgical History  Procedure Laterality Date  . Left knee arthroscopy  2003  . Right knee arthroscopy  2006  . Cardiac catheterization    . Coronary artery bypass graft  10/27/2011    Procedure: CORONARY ARTERY BYPASS GRAFTING (CABG);  Surgeon: Kerin Perna, MD;  Location: [facility] OR;  Service: Open Heart Surgery;  Laterality: N/A;  Coronary Artery Bypass Grafting times four using left internal mammary artery and right greater saphenous vein endoscopically harvested  . Left heart catheterization with coronary angiogram N/A 10/23/2011    Procedure: LEFT HEART CATHETERIZATION WITH CORONARY ANGIOGRAM;  Surgeon: Quintella Reichert, MD;  Location: [facility] CATH LAB;  Service: Cardiovascular;  Laterality: N/A;  . Left heart catheterization with coronary angiogram N/A 11/16/2013    Procedure: LEFT HEART CATHETERIZATION WITH CORONARY ANGIOGRAM;  Surgeon: Veverly Fells  Excell Seltzer, MD;  Location: [facility] CATH LAB;  Service: Cardiovascular;  Laterality: N/A;     Current Outpatient Prescriptions  Medication Sig Dispense Refill  . amLODipine (NORVASC) 5 MG tablet Take 10 mg by mouth daily.     Marland Kitchen aspirin 81 MG tablet Take 81 mg by mouth daily.    Marland Kitchen atorvastatin (LIPITOR) 80 MG tablet Take 80 mg by mouth daily.    . fish oil-omega-3 fatty acids 1000 MG capsule Take 2 g by mouth 2 (two) times daily.     . furosemide (LASIX) 20 MG tablet Take 1 tablet (20 mg total) by mouth daily. 30 tablet 6  . HUMALOG MIX 75/25 KWIKPEN (75-25) 100 UNIT/ML Kwikpen Inject 100 mLs as directed daily. Inject 30 units in the skin  daily    . isosorbide mononitrate (IMDUR) 60 MG 24 hr tablet Take 1 tablet (60 mg total) by mouth daily. 30 tablet 11  . KLOR-CON M10 10 MEQ tablet Take 10 mEq by mouth daily.    . metFORMIN (GLUCOPHAGE) 500 MG tablet Take 1,000 mg by mouth 2 (two) times daily with a meal.    . metoprolol succinate (TOPROL-XL) 100 MG 24 hr tablet TAKE 1 TABLET (100 MG TOTAL) BY MOUTH DAILY. TAKE WITH OR IMMEDIATELY FOLLOWING A MEAL. 30 tablet 11  . metoprolol succinate (TOPROL-XL) 25 MG 24 hr tablet Take 1 tablet (25 mg total) by mouth daily. Take with or immediately following a meal. 30 tablet 11  . Multiple Vitamin (MULTIVITAMIN WITH MINERALS) TABS Take 1 tablet by mouth daily.    Marland Kitchen olmesartan (BENICAR) 40 MG tablet Take 40 mg by mouth daily.    Marland Kitchen omeprazole (PRILOSEC) 20 MG capsule Take 20 mg by mouth daily as needed. For acid reflux    . potassium chloride (K-DUR) 10 MEQ tablet Take 1 tablet (10 mEq total) by mouth daily. 30 tablet 6  . pramipexole (MIRAPEX) 0.75 MG tablet Take 1.5 mg by mouth at bedtime.     Marland Kitchen spironolactone (ALDACTONE) 25 MG tablet Take 25 mg by mouth daily.     No current facility-administered medications for this visit.    Allergies:   Lisinopril    Social History:  The patient  reports that he has never smoked. He has never used smokeless tobacco. He reports that he drinks alcohol. He reports that he does not use illicit drugs.   Family History:  The patient's family history includes Hypertension in his father, mother, and paternal grandfather; Stroke in his father. There is no history of Heart attack.    ROS:  Please see the history of present illness.   Otherwise, review of systems are positive for none.   All other systems are reviewed and negative.    PHYSICAL EXAM: VS:  BP 124/78 mmHg  Pulse 59  Ht  (1.753 m)  Wt 246 lb 6.4 oz (111.766 kg)  BMI 36.37 kg/m2  SpO2 95% , BMI Body mass index is 36.37 kg/(m^2). GEN: Well nourished, well developed, in no acute  distress HEENT: normal Neck: no JVD, carotid bruits, or masses Cardiac: RRR; no murmurs, rubs, or gallops,no edema  Respiratory:  clear to auscultation bilaterally, normal work of breathing GI: soft, nontender, nondistended, + BS MS: no deformity or atrophy Skin: warm and dry, no rash Neuro:  Strength and sensation are intact Psych: euthymic mood, full affect   EKG:  EKG is not ordered today.    Recent Labs: 11/11/2013: ALT 19 11/14/2013: Hemoglobin 14.0; Platelets 278.0 12/16/2013: Pro  B Natriuretic peptide (BNP) 23.0 01/09/2014: BUN 19; Creatinine 1.3; Potassium 4.3; Sodium 135    Lipid Panel    Component Value Date/Time   CHOL 131 11/11/2013 0812   TRIG 96.0 11/11/2013 0812   HDL 35.20* 11/11/2013 0812   CHOLHDL 4 11/11/2013 0812   VLDL 19.2 11/11/2013 0812   LDLCALC 77 11/11/2013 0812      Wt Readings from Last 3 Encounters:  04/24/14 246 lb 6.4 oz (111.766 kg)  01/30/14 267 lb (121.11 kg)  01/09/14 267 lb (121.11 kg)    ASSESSMENT AND PLAN:  Coronary artery disease - Severe 3 vessel native ASCAD with 3/4 grafts patent. As noted, he had an occluded S-Dx at his LHC and severe distal RCA disease. The RCA is amenable to PCI but would jeopardize the S-RCA. Med Rx is planned. He has not had any further CP since increasing the nitrates   - Continue Amlodipine, statin, Zetia, beta blocker, ASA and Imdur  Chronic Shortness of breath -BNP in the past normal. I think a lot of his SOB is due to obesity. He has a lot of central obesity.He is now in the gym working out and thinks his SOB has improved some.    Chronic diastolic heart failure - Continue spironolactone. CHeck BMET  Essential hypertension - BP controlled.Continue Toprol to 125mg  daily.  Hyperlipidemia - Continue statin, Zetia. FLP in 9/15 showed an LDL of 77 and his goal is <16<70. Zetia was added and he was supposed to have repeat labs done but these were not done.  Will check FLP and  ALT.  DM (diabetes mellitus), type 2, uncontrolled with complications- FU with PCP.  Dilated aortic arch - repeat echo 03/2104 stable- repeat in 1 year     Current medicines are reviewed at length with the patient today.  The patient does not have concerns regarding medicines.  The following changes have been made:  no change  Labs/ tests ordered today include: FLP and ALT  No orders of the defined types were placed in this encounter.     Disposition:   FU with me in 6 months   Signed, Quintella ReichertURNER,Shukri Nistler R, MD  04/24/2014 8:24 AM    [facility] [address], [place], [place]  [address] Phone: [phone]; Fax: [phone]

## 2014-04-24 ENCOUNTER — Encounter: Payer: Self-pay | Admitting: Cardiology

## 2014-04-24 ENCOUNTER — Ambulatory Visit (INDEPENDENT_AMBULATORY_CARE_PROVIDER_SITE_OTHER): Payer: Commercial Managed Care - HMO | Admitting: Cardiology

## 2014-04-24 VITALS — BP 124/78 | HR 59 | Ht 69.0 in | Wt 246.4 lb

## 2014-04-24 DIAGNOSIS — I5032 Chronic diastolic (congestive) heart failure: Secondary | ICD-10-CM | POA: Diagnosis not present

## 2014-04-24 DIAGNOSIS — I77810 Thoracic aortic ectasia: Secondary | ICD-10-CM | POA: Diagnosis not present

## 2014-04-24 DIAGNOSIS — E785 Hyperlipidemia, unspecified: Secondary | ICD-10-CM

## 2014-04-24 DIAGNOSIS — I10 Essential (primary) hypertension: Secondary | ICD-10-CM

## 2014-04-24 DIAGNOSIS — I2510 Atherosclerotic heart disease of native coronary artery without angina pectoris: Secondary | ICD-10-CM | POA: Diagnosis not present

## 2014-04-24 DIAGNOSIS — I2583 Coronary atherosclerosis due to lipid rich plaque: Principal | ICD-10-CM

## 2014-04-24 LAB — LIPID PANEL
Cholesterol: 167 mg/dL (ref 0–200)
HDL: 38.8 mg/dL — ABNORMAL LOW (ref >39.00)
LDL Cholesterol: 98 mg/dL (ref 0–99)
NONHDL: 128.2
Total CHOL/HDL Ratio: 4
Triglycerides: 151 mg/dL — ABNORMAL HIGH (ref 0.0–149.0)
VLDL: 30.2 mg/dL (ref 0.0–40.0)

## 2014-04-24 LAB — BASIC METABOLIC PANEL
BUN: 18 mg/dL (ref 6–23)
CHLORIDE: 97 meq/L (ref 96–112)
CO2: 28 mEq/L (ref 19–32)
CREATININE: 1.31 mg/dL (ref 0.40–1.50)
Calcium: 9.5 mg/dL (ref 8.4–10.5)
GFR: 57.68 mL/min — ABNORMAL LOW (ref >60.00)
GLUCOSE: 373 mg/dL — AB (ref 70–99)
POTASSIUM: 4 meq/L (ref 3.5–5.1)
Sodium: 132 mEq/L — ABNORMAL LOW (ref 135–145)

## 2014-04-24 LAB — HEPATIC FUNCTION PANEL
ALBUMIN: 4.3 g/dL (ref 3.5–5.2)
ALT: 15 U/L (ref 0–53)
AST: 12 U/L (ref 0–37)
Alkaline Phosphatase: 89 U/L (ref 39–117)
Bilirubin, Direct: 0.1 mg/dL (ref 0.0–0.3)
TOTAL PROTEIN: 7.3 g/dL (ref 6.0–8.3)
Total Bilirubin: 0.5 mg/dL (ref 0.2–1.2)

## 2014-04-24 NOTE — Patient Instructions (Signed)
Your physician recommends that you have lab work TODAY (BMET, LFTs, lipids)  Your physician wants you to follow-up in: 6 months with Dr. Mayford Knifeurner. You will receive a reminder letter in the mail two months in advance. If you don't receive a letter, please call our office to schedule the follow-up appointment.

## 2014-04-25 ENCOUNTER — Telehealth: Payer: Self-pay

## 2014-04-25 DIAGNOSIS — E785 Hyperlipidemia, unspecified: Secondary | ICD-10-CM

## 2014-04-25 MED ORDER — EZETIMIBE 10 MG PO TABS: 10.0000 mg | ORAL_TABLET | Freq: Every day | ORAL | Status: DC

## 2014-04-25 NOTE — Telephone Encounter (Signed)
-----   Message from Quintella Reichertraci R Turner, MD sent at 04/25/2014 10:11 AM EST ----- LDL not at goal - add Zetia 10mg  daily and recheck FLP and ALT in 8 weeks

## 2014-04-25 NOTE — Telephone Encounter (Signed)
Patient informed of results and verbal understanding expressed.   Instructed patient to START ZETIA 10 mg daily. Fasting labs ordered and scheduled for 5/2.  Patient agrees with treatment plan.

## 2014-06-19 ENCOUNTER — Other Ambulatory Visit: Payer: Commercial Managed Care - HMO

## 2014-07-26 ENCOUNTER — Encounter: Payer: Self-pay | Admitting: Cardiology

## 2014-11-13 DIAGNOSIS — Z794 Long term (current) use of insulin: Secondary | ICD-10-CM | POA: Diagnosis not present

## 2014-11-13 DIAGNOSIS — Z23 Encounter for immunization: Secondary | ICD-10-CM | POA: Diagnosis not present

## 2014-11-13 DIAGNOSIS — G2581 Restless legs syndrome: Secondary | ICD-10-CM | POA: Diagnosis not present

## 2014-11-13 DIAGNOSIS — N183 Chronic kidney disease, stage 3 (moderate): Secondary | ICD-10-CM | POA: Diagnosis not present

## 2014-11-13 DIAGNOSIS — E1121 Type 2 diabetes mellitus with diabetic nephropathy: Secondary | ICD-10-CM | POA: Diagnosis not present

## 2014-11-13 DIAGNOSIS — E780 Pure hypercholesterolemia: Secondary | ICD-10-CM | POA: Diagnosis not present

## 2014-11-28 ENCOUNTER — Encounter: Payer: Self-pay | Admitting: Cardiology

## 2014-11-28 ENCOUNTER — Ambulatory Visit (INDEPENDENT_AMBULATORY_CARE_PROVIDER_SITE_OTHER): Payer: Commercial Managed Care - HMO | Admitting: Cardiology

## 2014-11-28 VITALS — BP 152/90 | HR 61 | Ht 69.0 in | Wt 254.8 lb

## 2014-11-28 DIAGNOSIS — I77810 Thoracic aortic ectasia: Secondary | ICD-10-CM

## 2014-11-28 DIAGNOSIS — I10 Essential (primary) hypertension: Secondary | ICD-10-CM | POA: Diagnosis not present

## 2014-11-28 DIAGNOSIS — E785 Hyperlipidemia, unspecified: Secondary | ICD-10-CM

## 2014-11-28 DIAGNOSIS — I5032 Chronic diastolic (congestive) heart failure: Secondary | ICD-10-CM | POA: Diagnosis not present

## 2014-11-28 DIAGNOSIS — I2510 Atherosclerotic heart disease of native coronary artery without angina pectoris: Secondary | ICD-10-CM | POA: Diagnosis not present

## 2014-11-28 DIAGNOSIS — I2583 Coronary atherosclerosis due to lipid rich plaque: Secondary | ICD-10-CM

## 2014-11-28 LAB — HEPATIC FUNCTION PANEL
ALK PHOS: 76 U/L (ref 40–115)
ALT: 16 U/L (ref 9–46)
AST: 15 U/L (ref 10–35)
Albumin: 4.1 g/dL (ref 3.6–5.1)
BILIRUBIN DIRECT: 0.1 mg/dL (ref <=0.2)
BILIRUBIN INDIRECT: 0.5 mg/dL (ref 0.2–1.2)
TOTAL PROTEIN: 6.9 g/dL (ref 6.1–8.1)
Total Bilirubin: 0.6 mg/dL (ref 0.2–1.2)

## 2014-11-28 LAB — LIPID PANEL
CHOL/HDL RATIO: 3.5 ratio (ref <=5.0)
Cholesterol: 126 mg/dL (ref 125–200)
HDL: 36 mg/dL — AB (ref >=40)
LDL CALC: 69 mg/dL (ref <130)
Triglycerides: 107 mg/dL (ref <150)
VLDL: 21 mg/dL (ref <30)

## 2014-11-28 LAB — BASIC METABOLIC PANEL
BUN: 19 mg/dL (ref 7–25)
CALCIUM: 9.1 mg/dL (ref 8.6–10.3)
CO2: 25 mmol/L (ref 20–31)
Chloride: 99 mmol/L (ref 98–110)
Creat: 1.13 mg/dL (ref 0.70–1.25)
Glucose, Bld: 362 mg/dL — ABNORMAL HIGH (ref 65–99)
Potassium: 3.9 mmol/L (ref 3.5–5.3)
SODIUM: 135 mmol/L (ref 135–146)

## 2014-11-28 MED ORDER — ATORVASTATIN CALCIUM 80 MG PO TABS: 80.0000 mg | ORAL_TABLET | Freq: Every day | ORAL | Status: DC

## 2014-11-28 MED ORDER — FUROSEMIDE 20 MG PO TABS: 20.0000 mg | ORAL_TABLET | Freq: Every day | ORAL | Status: DC

## 2014-11-28 MED ORDER — EZETIMIBE 10 MG PO TABS: 10.0000 mg | ORAL_TABLET | Freq: Every day | ORAL | Status: DC

## 2014-11-28 MED ORDER — OLMESARTAN MEDOXOMIL 40 MG PO TABS: 40.0000 mg | ORAL_TABLET | Freq: Every day | ORAL | Status: DC

## 2014-11-28 MED ORDER — KLOR-CON M10 10 MEQ PO TBCR: 10.0000 meq | EXTENDED_RELEASE_TABLET | Freq: Every day | ORAL | Status: DC

## 2014-11-28 MED ORDER — SPIRONOLACTONE 25 MG PO TABS: 25.0000 mg | ORAL_TABLET | Freq: Every day | ORAL | Status: DC

## 2014-11-28 MED ORDER — ISOSORBIDE MONONITRATE ER 60 MG PO TB24: 60.0000 mg | ORAL_TABLET | Freq: Every day | ORAL | Status: DC

## 2014-11-28 MED ORDER — AMLODIPINE BESYLATE 10 MG PO TABS: 10.0000 mg | ORAL_TABLET | Freq: Every day | ORAL | Status: DC

## 2014-11-28 NOTE — Patient Instructions (Signed)
Medication Instructions:  Your physician recommends that you continue on your current medications as directed. Please refer to the Current Medication list given to you today.   Labwork: TODAY: BMET, LFTs, Lipids  Testing/Procedures: None  Follow-Up: Your physician wants you to follow-up in: 6 months with Dr. Turner. You will receive a reminder letter in the mail two months in advance. If you don't receive a letter, please call our office to schedule the follow-up appointment.   Any Other Special Instructions Will Be Listed Below (If Applicable). 

## 2014-11-28 NOTE — Progress Notes (Addendum)
Cardiology Office Note   Date:  11/28/2014   ID:  James [name], James [name] 12/13/1945, MRN [id number]  PCP:  Lenora Boys, MD    Chief Complaint  Patient presents with  . Coronary Artery Disease  . Congestive Heart Failure      History of Present Illness: James [name] is a 69 y.o. male with a history of HTN, dyslipidemia and chronic diastolic CHF with chronic LE edema. He also has a history of ASCAD with last cath showing native 3 v CAD and 3/4 grafts patent.The SVG-Dx was occluded and the native Dx was small and not amenable to PCI. There was severe distal RCA disease and this was amenable to PCI to restore flow to the PLA vessels but this would jeopardize the SVG-PDA. Med Rx was recommended. He returns for FU.He rarely has some anginal pain but has never had to take any SL NTG. He has chronic DOE as well related to underlying obesity and has improved some with exercise. He denies any dizziness, palpitations or syncope. He has increased the amount of exercising he has been doing.      Past Medical History  Diagnosis Date  . Hypertension   . Dyslipidemia   . Deviated nasal septum   . GERD (gastroesophageal reflux disease)   . Diabetes mellitus     onset 2013  . Coronary artery disease 10/2011    a. s/p cabg;  b. Myoview (9/15):  ant ischemia, poss TID, EF 43%, high risk >> LHC (9/15): Dist LM 50, pLAD 90 then 100, pCFX 50, mCFX 75, oRCA 75, dRCA 95, L-LAD patent, S-Dx 100, S-OM1 patent, S-PDA patent, EF 55% >> native Dx small caliber and not well suited for PCI; native RCA amenable to PCI and would restore flow to PLA branches but would jeopardize SVG-RCA - Med Rx  . Atrial flutter (HCC)     Post-op with no reoccurence  . Chronic diastolic heart failure (HCC)     Echo (1/15): Left ventricle: The cavity size was mildly dilated. Mild LVH. EF 55% to 60%. Wall motion was normal; Gr 1 diastolic dysfunction Left atrium: The atrium was mildly dilated.  Right atrium: The atrium was mildly to moderately dilated. Aortic arch measures 4.8 cm (moderately dilated); suggest CTA or MRA to better assess.  . Ascending aortic aneurysm (HCC)     cMRI (2/15): Normal LV EF 54%, Mild BAE, Trileaflet Aortic Valve, Upper limits of normal ascending aorta 3.8 cm, Normal aortic arch 2.3 cm with bovine origin of left carotid  . Hx of Doppler ultrasound     Carotid US (9/13): No ICA stenosis    Past Surgical History  Procedure Laterality Date  . Left knee arthroscopy  2003  . Right knee arthroscopy  2006  . Cardiac catheterization    . Coronary artery bypass graft  10/27/2011    Procedure: CORONARY ARTERY BYPASS GRAFTING (CABG);  Surgeon: Kerin Perna, MD;  Location: [facility] OR;  Service: Open Heart Surgery;  Laterality: N/A;  Coronary Artery Bypass Grafting times four using left internal mammary artery and right greater saphenous vein endoscopically harvested  . Left heart catheterization with coronary angiogram N/A 10/23/2011    Procedure: LEFT HEART CATHETERIZATION WITH CORONARY ANGIOGRAM;  Surgeon: Quintella Reichert, MD;  Location: [facility] CATH LAB;  Service: Cardiovascular;  Laterality: N/A;  . Left heart catheterization with coronary angiogram N/A 11/16/2013  Procedure: LEFT HEART CATHETERIZATION WITH CORONARY ANGIOGRAM;  Surgeon: Micheline Chapman, MD;  Location: [facility] CATH LAB;  Service: Cardiovascular;  Laterality: N/A;     Current Outpatient Prescriptions  Medication Sig Dispense Refill  . amLODipine (NORVASC) 10 MG tablet Take 1 tablet (10 mg total) by mouth daily. 30 tablet 11  . aspirin 81 MG tablet Take 81 mg by mouth daily.    Marland Kitchen atorvastatin (LIPITOR) 80 MG tablet Take 1 tablet (80 mg total) by mouth daily. 30 tablet 11  . ezetimibe (ZETIA) 10 MG tablet Take 1 tablet (10 mg total) by mouth daily. 30 tablet 11  . fish oil-omega-3 fatty acids 1000 MG capsule Take 2 g by mouth 2 (two) times daily.     . furosemide (LASIX) 20 MG tablet Take 1 tablet (20 mg  total) by mouth daily. 30 tablet 11  . HUMALOG MIX 75/25 KWIKPEN (75-25) 100 UNIT/ML Kwikpen Inject 100 mLs as directed daily. Inject 30 units in the skin daily    . isosorbide mononitrate (IMDUR) 60 MG 24 hr tablet Take 1 tablet (60 mg total) by mouth daily. 30 tablet 11  . KLOR-CON M10 10 MEQ tablet Take 1 tablet (10 mEq total) by mouth daily. 30 tablet 11  . metFORMIN (GLUCOPHAGE) 500 MG tablet Take 1,000 mg by mouth 2 (two) times daily with a meal.    . metoprolol succinate (TOPROL-XL) 100 MG 24 hr tablet TAKE 1 TABLET (100 MG TOTAL) BY MOUTH DAILY. TAKE WITH OR IMMEDIATELY FOLLOWING A MEAL. 30 tablet 11  . metoprolol succinate (TOPROL-XL) 25 MG 24 hr tablet Take 1 tablet (25 mg total) by mouth daily. Take with or immediately following a meal. 30 tablet 11  . Multiple Vitamin (MULTIVITAMIN WITH MINERALS) TABS Take 1 tablet by mouth daily.    Marland Kitchen olmesartan (BENICAR) 40 MG tablet Take 1 tablet (40 mg total) by mouth daily. 30 tablet 11  . omeprazole (PRILOSEC) 20 MG capsule Take 20 mg by mouth daily as needed. For acid reflux    . potassium chloride (K-DUR) 10 MEQ tablet Take 1 tablet (10 mEq total) by mouth daily. 30 tablet 6  . pramipexole (MIRAPEX) 0.75 MG tablet Take 1.5 mg by mouth at bedtime.     Marland Kitchen spironolactone (ALDACTONE) 25 MG tablet Take 1 tablet (25 mg total) by mouth daily. 30 tablet 11   No current facility-administered medications for this visit.    Allergies:   Lisinopril    Social History:  The patient  reports that he has never smoked. He has never used smokeless tobacco. He reports that he drinks alcohol. He reports that he does not use illicit drugs.   Family History:  The patient's family history includes Hypertension in his father, mother, and paternal grandfather; Stroke in his father. There is no history of Heart attack.    ROS:  Please see the history of present illness.   Otherwise, review of systems are positive for none.   All other systems are reviewed and  negative.    PHYSICAL EXAM: VS:  BP 152/90 mmHg  Pulse 61  Ht 5\' 9"  (1.753 m)  Wt 254 lb 12.8 oz (115.577 kg)  BMI 37.61 kg/m2 , BMI Body mass index is 37.61 kg/(m^2). GEN: Well nourished, well developed, in no acute distress HEENT: normal Neck: no JVD, carotid bruits, or masses Cardiac: RRR; no murmurs, rubs, or gallops,no edema  Respiratory:  clear to auscultation bilaterally, normal work of breathing GI: soft, nontender, nondistended, + BS MS:  no deformity or atrophy Skin: warm and dry, no rash Neuro:  Strength and sensation are intact Psych: euthymic mood, full affect   EKG:  EKG is ordered today. The ekg ordered today demonstrates NSR at 61bpm with no ST changes   Recent Labs: 12/16/2013: Pro B Natriuretic peptide (BNP) 23.0 04/24/2014: ALT 15; BUN 18; Creatinine, Ser 1.31; Potassium 4.0; Sodium 132*    Lipid Panel    Component Value Date/Time   CHOL 167 04/24/2014 0842   TRIG 151.0* 04/24/2014 0842   HDL 38.80* 04/24/2014 0842   CHOLHDL 4 04/24/2014 0842   VLDL 30.2 04/24/2014 0842   LDLCALC 98 04/24/2014 0842      Wt Readings from Last 3 Encounters:  11/28/14 254 lb 12.8 oz (115.577 kg)  04/24/14 246 lb 6.4 oz (111.766 kg)  01/30/14 267 lb (121.11 kg)    ASSESSMENT AND PLAN:  Coronary artery disease - Severe 3 vessel native ASCAD with 3/4 grafts patent. As noted, he had an occluded S-Dx at his LHC and severe distal RCA disease. The RCA is amenable to PCI but would jeopardize the S-RCA. Med Rx is planned. He rarely has anginal CP since increasing the nitrates   - Continue Amlodipine, statin, Zetia, beta blocker, ASA and Imdur  Chronic Shortness of breath -BNP in the past normal. I think a lot of his SOB is due to obesity. He has a lot of central obesity.He is now in the gym working out and thinks his SOB has improved some.   Chronic diastolic heart failure - Continue spironolactone/BB/diuretic. CHeck BMET  Essential hypertension - BP  borderline controlled - at home it runs 135/70mmHg.  Continue BB/aldactone/ARB/amlodipine  Hyperlipidemia - Continue statin, Zetia. Will check FLP and ALT.  DM (diabetes mellitus), type 2, uncontrolled with complications- FU with PCP.  Dilated aortic arch - repeat echo 03/2104 stable- repeat in 03/2015   Current medicines are reviewed at length with the patient today.  The patient does not have concerns regarding medicines.  The following changes have been made:  no change  Labs/ tests ordered today: See above Assessment and Plan  Orders Placed This Encounter  Procedures  . Basic metabolic panel  . Hepatic function panel  . Lipid panel  . EKG 12-Lead     Disposition:   FU with me in 6 months  Signed, Quintella Reichert, MD  11/28/2014 10:18 AM    [facility] [address] [place], [place], [place]  [address] Phone: [phone]; Fax: [phone]

## 2015-01-18 ENCOUNTER — Other Ambulatory Visit: Payer: Self-pay | Admitting: Cardiology

## 2015-02-05 ENCOUNTER — Telehealth: Payer: Self-pay | Admitting: Cardiology

## 2015-02-05 NOTE — Telephone Encounter (Signed)
New Message  Patient calling the office for samples of medication:   1.  What medication and dosage are you requesting samples for?HUMALOG MIX 75/25 KWIKPEN (75-25) 100 UNIT/ML Kwikpen   2.  Are you currently out of this medication? No. In donut hole

## 2015-02-05 NOTE — Telephone Encounter (Signed)
Patient aware that we do not have samples of diabetic medications. He was advised to call physician who follows his diabetes. Patient verbalized understanding.

## 2015-02-20 MED FILL — PRAMIPEXOLE 0.75 MG TABLET: 0.75 | 30 days supply | Qty: 60 | Fill #3

## 2015-02-22 DIAGNOSIS — E1121 Type 2 diabetes mellitus with diabetic nephropathy: Secondary | ICD-10-CM | POA: Diagnosis not present

## 2015-02-22 DIAGNOSIS — Z7984 Long term (current) use of oral hypoglycemic drugs: Secondary | ICD-10-CM | POA: Diagnosis not present

## 2015-02-22 DIAGNOSIS — Z794 Long term (current) use of insulin: Secondary | ICD-10-CM | POA: Diagnosis not present

## 2015-03-07 ENCOUNTER — Other Ambulatory Visit: Payer: Self-pay | Admitting: Cardiology

## 2015-03-07 MED FILL — FUROSEMIDE 20 MG TABLET: 20 | 30 days supply | Qty: 30 | Fill #2

## 2015-03-07 MED FILL — NOVOLOG MIX 70-30 FLEXPEN S: (70-30) 100 | 25 days supply | Qty: 15 | Fill #0

## 2015-03-07 MED FILL — ATORVASTATIN 80 MG TABLET: 80 | 30 days supply | Qty: 30 | Fill #4

## 2015-03-07 MED FILL — ISOSORBIDE MN ER 60 MG TAB: 60 | 30 days supply | Qty: 30 | Fill #2

## 2015-03-07 MED FILL — KLOR-CON M10 TABLET: 10 | 30 days supply | Qty: 30 | Fill #2

## 2015-03-07 MED FILL — ACCU-CHEK AVIVA PLUS METER: W/DEVICE | 30 days supply | Qty: 1 | Fill #0

## 2015-03-07 MED FILL — METOPROLOL SUCC ER 25 MG TA: 25 | 30 days supply | Qty: 30 | Fill #1

## 2015-03-07 MED FILL — METFORMIN HCL ER 500 MG TAB: 500 | 30 days supply | Qty: 120 | Fill #0

## 2015-03-07 MED FILL — METOPROLOL SUCC ER 100 MG T: 100 | 30 days supply | Qty: 30 | Fill #0

## 2015-03-07 MED FILL — ACCU-CHEK AVIVA PLUS TEST S: 50 days supply | Qty: 100 | Fill #0

## 2015-03-13 MED FILL — ACCU-CHEK MULTICLIX LANCETS: 51 days supply | Qty: 102 | Fill #0

## 2015-03-13 MED FILL — ACCU-CHEK SOFTCLIX LANCETS: 50 days supply | Qty: 100 | Fill #0

## 2015-03-13 MED FILL — ULTICARE PEN NDL 8MM 31G: 31G X 8 MM | 50 days supply | Qty: 100 | Fill #0

## 2015-03-14 DIAGNOSIS — I25119 Atherosclerotic heart disease of native coronary artery with unspecified angina pectoris: Secondary | ICD-10-CM | POA: Diagnosis not present

## 2015-03-14 DIAGNOSIS — I130 Hypertensive heart and chronic kidney disease with heart failure and stage 1 through stage 4 chronic kidney disease, or unspecified chronic kidney disease: Secondary | ICD-10-CM | POA: Diagnosis not present

## 2015-03-14 DIAGNOSIS — N183 Chronic kidney disease, stage 3 (moderate): Secondary | ICD-10-CM | POA: Diagnosis not present

## 2015-03-14 DIAGNOSIS — Z0000 Encounter for general adult medical examination without abnormal findings: Secondary | ICD-10-CM | POA: Diagnosis not present

## 2015-03-15 MED FILL — PRAMIPEXOLE 0.75 MG TABLET: 0.75 | 30 days supply | Qty: 60 | Fill #0

## 2015-03-22 DIAGNOSIS — H524 Presbyopia: Secondary | ICD-10-CM | POA: Diagnosis not present

## 2015-03-22 DIAGNOSIS — H5203 Hypermetropia, bilateral: Secondary | ICD-10-CM | POA: Diagnosis not present

## 2015-03-22 DIAGNOSIS — Z794 Long term (current) use of insulin: Secondary | ICD-10-CM | POA: Diagnosis not present

## 2015-03-22 DIAGNOSIS — H5210 Myopia, unspecified eye: Secondary | ICD-10-CM | POA: Diagnosis not present

## 2015-03-22 DIAGNOSIS — H52223 Regular astigmatism, bilateral: Secondary | ICD-10-CM | POA: Diagnosis not present

## 2015-04-04 MED FILL — METOPROLOL SUCC ER 25 MG TA: 25 | 30 days supply | Qty: 30 | Fill #2

## 2015-04-04 MED FILL — FUROSEMIDE 20 MG TABLET: 20 | 30 days supply | Qty: 30 | Fill #3

## 2015-04-04 MED FILL — KLOR-CON M10 TABLET: 10 | 30 days supply | Qty: 30 | Fill #3

## 2015-04-04 MED FILL — METOPROLOL SUCC ER 100 MG T: 100 | 30 days supply | Qty: 30 | Fill #1

## 2015-04-04 MED FILL — NOVOLOG MIX 70-30 FLEXPEN S: (70-30) 100 | 25 days supply | Qty: 15 | Fill #1

## 2015-04-04 MED FILL — ISOSORBIDE MN ER 60 MG TAB: 60 | 30 days supply | Qty: 30 | Fill #3

## 2015-04-20 MED FILL — PRAMIPEXOLE 0.75 MG TABLET: 0.75 | 30 days supply | Qty: 60 | Fill #1 | Status: TO

## 2015-04-20 MED FILL — METFORMIN HCL ER 500 MG TAB: 500 | 30 days supply | Qty: 120 | Fill #1

## 2015-05-21 MED FILL — ACCU-CHEK AVIVA PLUS TEST S: 50 days supply | Qty: 100 | Fill #1

## 2015-05-21 MED FILL — KLOR-CON M10 TABLET: 10 | 30 days supply | Qty: 30 | Fill #4

## 2015-05-21 MED FILL — NOVOLOG MIX 70-30 FLEXPEN S: (70-30) 100 | 25 days supply | Qty: 15 | Fill #2

## 2015-05-21 MED FILL — METOPROLOL SUCC ER 100 MG T: 100 | 30 days supply | Qty: 30 | Fill #2

## 2015-05-21 MED FILL — ATORVASTATIN 80 MG TABLET: 80 | 30 days supply | Qty: 30 | Fill #0

## 2015-05-21 MED FILL — METFORMIN HCL ER 500 MG TAB: 500 | 30 days supply | Qty: 120 | Fill #2

## 2015-05-21 MED FILL — ISOSORBIDE MN ER 60 MG TAB: 60 | 30 days supply | Qty: 30 | Fill #4

## 2015-05-21 MED FILL — FUROSEMIDE 20 MG TABLET: 20 | 30 days supply | Qty: 30 | Fill #4

## 2015-05-21 MED FILL — METOPROLOL SUCC ER 25 MG TA: 25 | 30 days supply | Qty: 30 | Fill #3

## 2015-05-25 MED FILL — ULTICARE PEN NDL 8MM 31G: 31G X 8 MM | 50 days supply | Qty: 100 | Fill #1

## 2015-06-06 DIAGNOSIS — E1121 Type 2 diabetes mellitus with diabetic nephropathy: Secondary | ICD-10-CM | POA: Diagnosis not present

## 2015-06-06 DIAGNOSIS — E1165 Type 2 diabetes mellitus with hyperglycemia: Secondary | ICD-10-CM | POA: Diagnosis not present

## 2015-06-06 DIAGNOSIS — Z7984 Long term (current) use of oral hypoglycemic drugs: Secondary | ICD-10-CM | POA: Diagnosis not present

## 2015-06-06 DIAGNOSIS — Z794 Long term (current) use of insulin: Secondary | ICD-10-CM | POA: Diagnosis not present

## 2015-06-08 ENCOUNTER — Ambulatory Visit: Payer: Commercial Managed Care - HMO | Admitting: Cardiology

## 2015-06-09 IMAGING — CR DG CHEST 2V
2 series · 2 of 2 positions shown · non-contrast
Comparison: 11/12/2011

CLINICAL DATA: Cough for 2 months, shortness of breath

EXAM:
CHEST  2 VIEW

[view not recorded (1 of 2)]
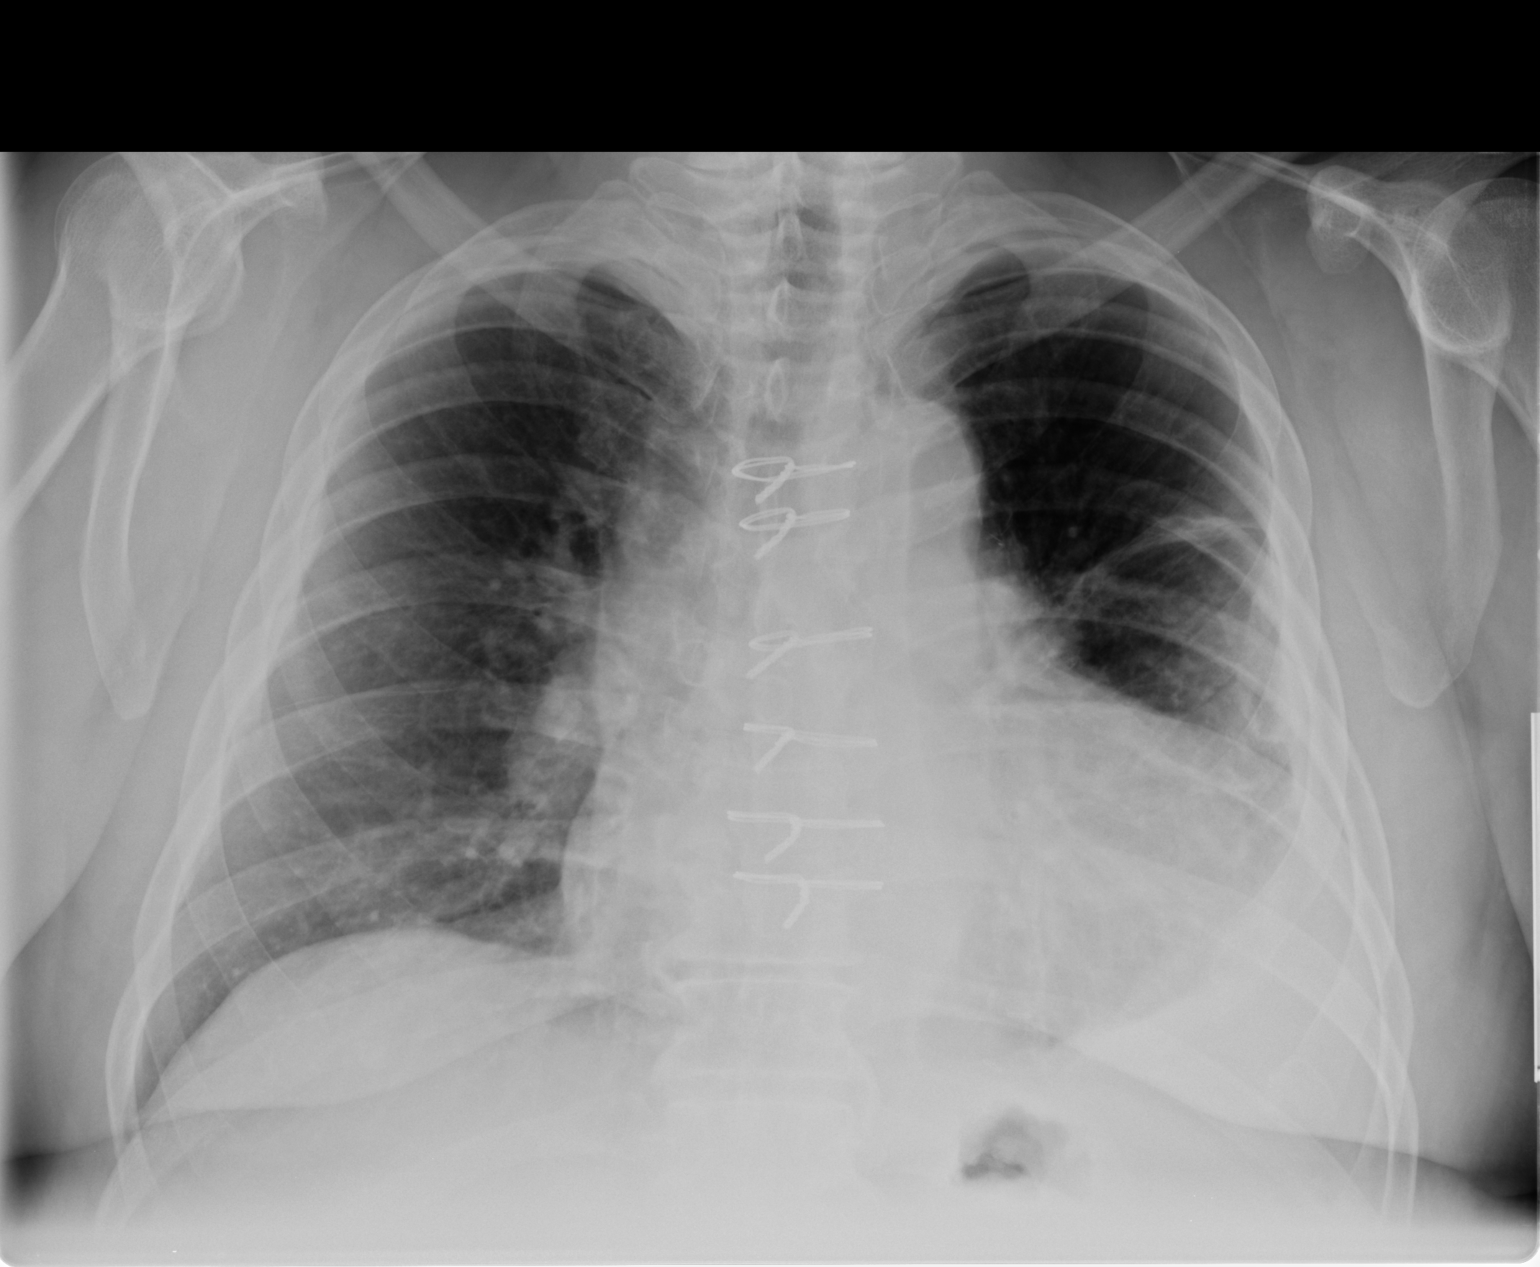

[view not recorded (2 of 2)]
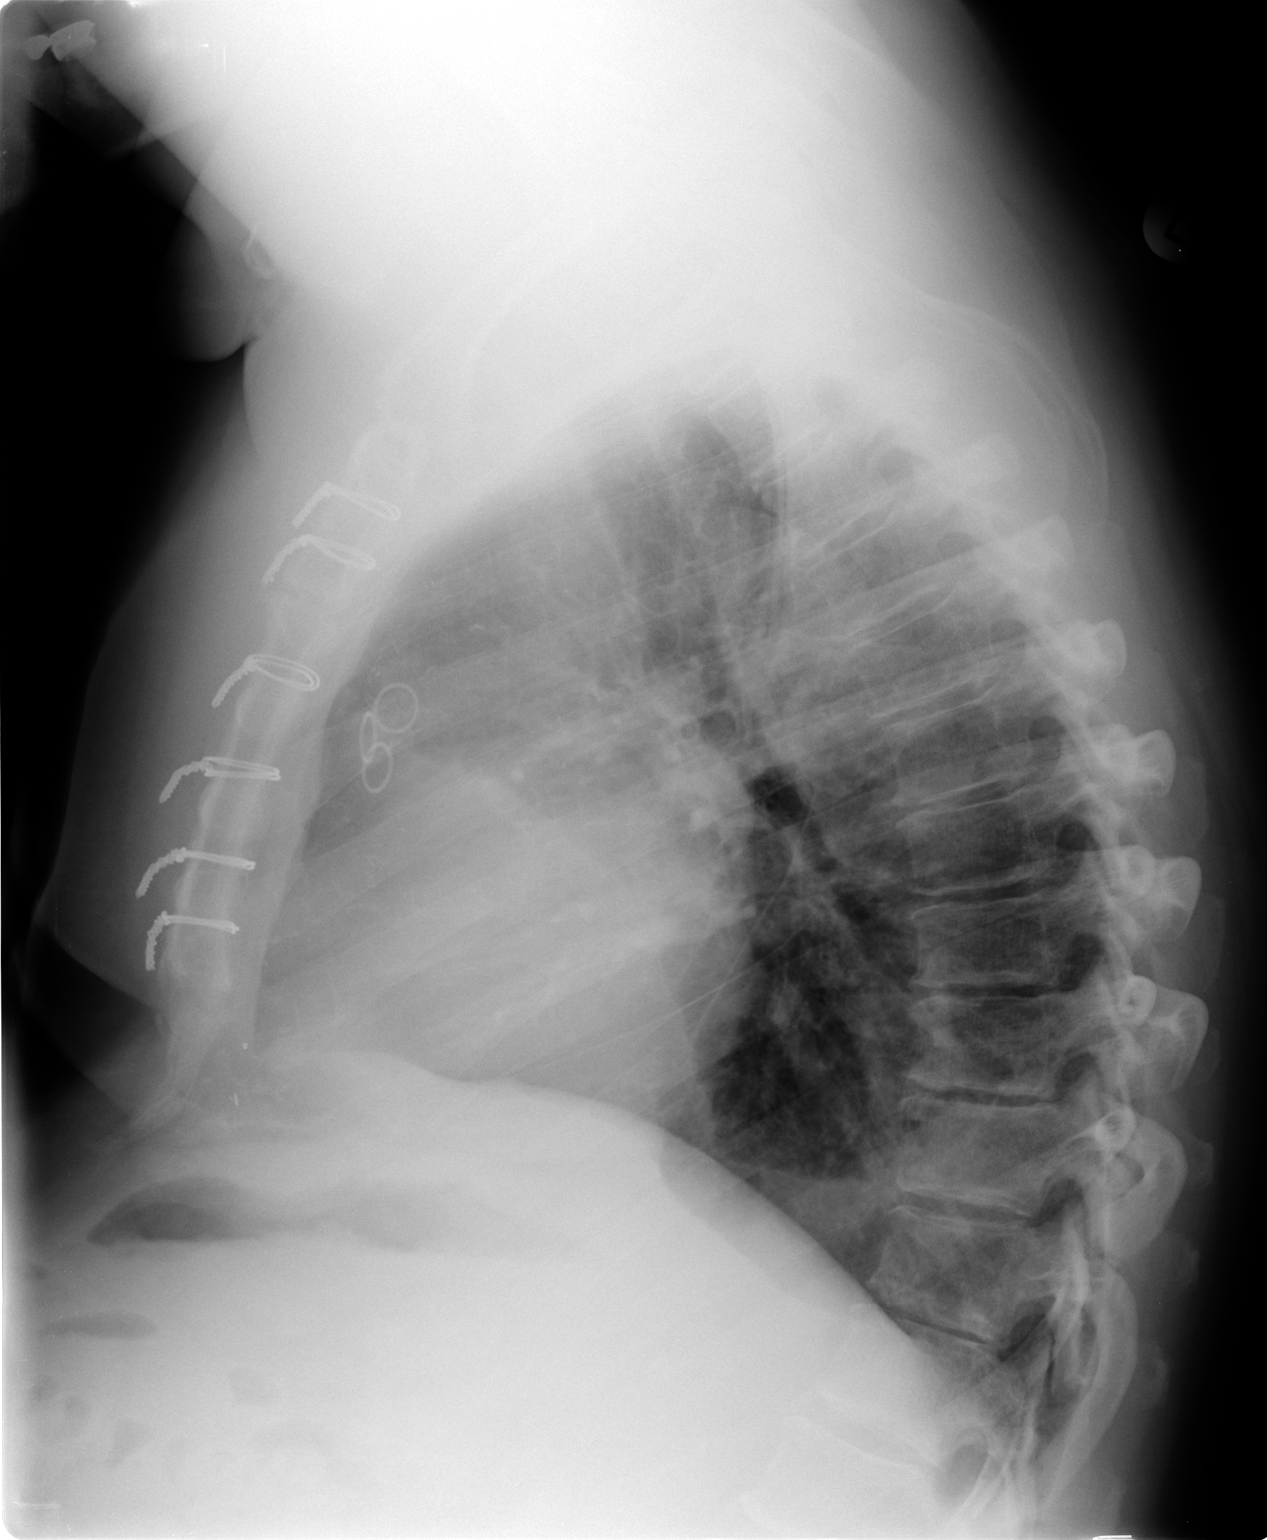

[2 of 2 positions shown; findings below may reference images not displayed]

FINDINGS: There is a small left pleural effusion. There is left basilar
airspace disease which may reflect chronic interstitial disease
versus atelectasis. There is no new focal parenchymal opacity,
pneumothorax or right pleural effusion. There is stable
cardiomegaly. There is evidence of prior CABG.

The osseous structures are unremarkable.
IMPRESSION: 1. Small left pleural effusion.
2. Stable cardiomegaly.

## 2015-06-22 MED FILL — METFORMIN HCL ER 500 MG TAB: 500 | 30 days supply | Qty: 120 | Fill #3

## 2015-06-22 MED FILL — METOPROLOL SUCC ER 100 MG T: 100 | 30 days supply | Qty: 30 | Fill #3

## 2015-06-22 MED FILL — METOPROLOL SUCC ER 25 MG TA: 25 | 30 days supply | Qty: 30 | Fill #4

## 2015-06-22 MED FILL — KLOR-CON M10 TABLET: 10 | 30 days supply | Qty: 30 | Fill #5

## 2015-06-22 MED FILL — ISOSORBIDE MN ER 60 MG TAB: 60 | 30 days supply | Qty: 30 | Fill #5

## 2015-06-22 MED FILL — FUROSEMIDE 20 MG TABLET: 20 | 30 days supply | Qty: 30 | Fill #5

## 2015-06-22 MED FILL — ATORVASTATIN 80 MG TABLET: 80 | 30 days supply | Qty: 30 | Fill #1

## 2015-07-16 NOTE — Progress Notes (Signed)
  ROS All other systems reviewed and are negative.   This encounter was created in error - please disregard. 

## 2015-07-17 ENCOUNTER — Encounter: Payer: Commercial Managed Care - HMO | Admitting: Cardiology

## 2015-07-19 MED FILL — NOVOLOG MIX 70-30 FLEXPEN S: (70-30) 100 | 25 days supply | Qty: 15 | Fill #3

## 2015-07-23 MED FILL — ISOSORBIDE MN ER 60 MG TAB: 60 | 30 days supply | Qty: 30 | Fill #6

## 2015-07-23 MED FILL — ATORVASTATIN 80 MG TABLET: 80 | 30 days supply | Qty: 30 | Fill #2

## 2015-07-23 MED FILL — METFORMIN HCL ER 500 MG TAB: 500 | 30 days supply | Qty: 120 | Fill #4

## 2015-07-23 MED FILL — METOPROLOL SUCC ER 25 MG TA: 25 | 30 days supply | Qty: 30 | Fill #5

## 2015-07-30 MED FILL — METOPROLOL SUCC ER 100 MG T: 100 | 30 days supply | Qty: 30 | Fill #4

## 2015-07-30 MED FILL — FUROSEMIDE 20 MG TABLET: 20 | 30 days supply | Qty: 30 | Fill #6

## 2015-07-30 MED FILL — KLOR-CON M10 TABLET: 10 | 30 days supply | Qty: 30 | Fill #6

## 2015-09-07 MED FILL — ISOSORBIDE MN ER 60 MG TAB: 60 | 30 days supply | Qty: 30 | Fill #7

## 2015-09-07 MED FILL — METOPROLOL SUCC ER 100 MG T: 100 | 30 days supply | Qty: 30 | Fill #5

## 2015-09-07 MED FILL — KLOR-CON M10 TABLET: 10 | 30 days supply | Qty: 30 | Fill #7

## 2015-09-07 MED FILL — ATORVASTATIN 80 MG TABLET: 80 | 30 days supply | Qty: 30 | Fill #3

## 2015-09-07 MED FILL — METOPROLOL SUCC ER 25 MG TA: 25 | 30 days supply | Qty: 30 | Fill #6

## 2015-09-27 DIAGNOSIS — Z7984 Long term (current) use of oral hypoglycemic drugs: Secondary | ICD-10-CM | POA: Diagnosis not present

## 2015-09-27 DIAGNOSIS — E1121 Type 2 diabetes mellitus with diabetic nephropathy: Secondary | ICD-10-CM | POA: Diagnosis not present

## 2015-09-27 MED FILL — METFORMIN HCL ER 500 MG TAB: 500 | 30 days supply | Qty: 120 | Fill #0

## 2015-10-01 DIAGNOSIS — E119 Type 2 diabetes mellitus without complications: Secondary | ICD-10-CM | POA: Diagnosis not present

## 2015-10-01 DIAGNOSIS — B029 Zoster without complications: Secondary | ICD-10-CM | POA: Diagnosis not present

## 2015-10-01 MED FILL — ULTICARE PEN NDL 8MM 31G: 31G X 8 MM | 50 days supply | Qty: 100 | Fill #2

## 2015-10-01 MED FILL — valACYclovir HCL 1 GM TABS: 1 | 7 days supply | Qty: 21 | Fill #0

## 2015-10-03 MED FILL — GLIMEPIRIDE 4 MG TABLET: 4 | 30 days supply | Qty: 30 | Fill #0

## 2015-10-04 MED FILL — METOPROLOL SUCC ER 100 MG T: 100 | 30 days supply | Qty: 30 | Fill #6

## 2015-10-04 MED FILL — METOPROLOL SUCC ER 25 MG TA: 25 | 30 days supply | Qty: 30 | Fill #7

## 2015-10-09 DIAGNOSIS — B029 Zoster without complications: Secondary | ICD-10-CM | POA: Diagnosis not present

## 2015-10-23 ENCOUNTER — Encounter (INDEPENDENT_AMBULATORY_CARE_PROVIDER_SITE_OTHER): Payer: Self-pay

## 2015-10-23 ENCOUNTER — Ambulatory Visit (INDEPENDENT_AMBULATORY_CARE_PROVIDER_SITE_OTHER): Payer: Commercial Managed Care - HMO | Admitting: Cardiology

## 2015-10-23 ENCOUNTER — Encounter: Payer: Self-pay | Admitting: Cardiology

## 2015-10-23 VITALS — BP 162/86 | HR 75 | Ht 69.0 in | Wt 252.0 lb

## 2015-10-23 DIAGNOSIS — I5032 Chronic diastolic (congestive) heart failure: Secondary | ICD-10-CM | POA: Diagnosis not present

## 2015-10-23 DIAGNOSIS — I77810 Thoracic aortic ectasia: Secondary | ICD-10-CM

## 2015-10-23 DIAGNOSIS — I2510 Atherosclerotic heart disease of native coronary artery without angina pectoris: Secondary | ICD-10-CM

## 2015-10-23 DIAGNOSIS — I10 Essential (primary) hypertension: Secondary | ICD-10-CM

## 2015-10-23 DIAGNOSIS — E785 Hyperlipidemia, unspecified: Secondary | ICD-10-CM

## 2015-10-23 DIAGNOSIS — I2583 Coronary atherosclerosis due to lipid rich plaque: Principal | ICD-10-CM

## 2015-10-23 NOTE — Progress Notes (Signed)
Cardiology Office Note    Date:  10/23/2015   ID:  James [name], James [name] 23-Aug-1945, MRN [id number]  PCP:  Joycelyn Rua, MD  Cardiologist:  Armanda Magic, MD   Chief Complaint  Patient presents with  . Coronary Artery Disease  . Hypertension  . Congestive Heart Failure    History of Present Illness:  James [name] is a 70 y.o. male with a history of HTN, dyslipidemia and chronic diastolic CHF with chronic LE edema. He also has a history of ASCAD with last cath showing native 3 v CAD and 3/4 grafts patent.The SVG-Dx was occluded and the native Dx was small and not amenable to PCI. There was severe distal RCA disease and this was amenable to PCI to restore flow to the PLA vessels but this would jeopardize the SVG-PDA. Med Rx was recommended. He returns for FU.He denies any anginal pain. He has chronic DOE related to underlying obesity and has improved some with exercise. He denies any dizziness, palpitations, LE edema, claudication or syncope. He has started an exercise program in the pool.    Past Medical History:  Diagnosis Date  . Ascending aortic aneurysm (HCC)    cMRI (2/15): Normal LV EF 54%, Mild BAE, Trileaflet Aortic Valve, Upper limits of normal ascending aorta 3.8 cm, Normal aortic arch 2.3 cm with bovine origin of left carotid  . Atrial flutter (HCC)    Post-op with no reoccurence  . Chronic diastolic heart failure (HCC)    Echo (1/15): Left ventricle: The cavity size was mildly dilated. Mild LVH. EF 55% to 60%. Wall motion was normal; Gr 1 diastolic dysfunction Left atrium: The atrium was mildly dilated. Right atrium: The atrium was mildly to moderately dilated. Aortic arch measures 4.8 cm (moderately dilated); suggest CTA or MRA to better assess.  . Coronary artery disease 10/2011   a. s/p cabg;  b. Myoview (9/15):  ant ischemia, poss TID, EF 43%, high risk >> LHC (9/15): Dist LM 50, pLAD 90 then 100, pCFX 50, mCFX 75, oRCA 75, dRCA 95, L-LAD patent, S-Dx 100,  S-OM1 patent, S-PDA patent, EF 55% >> native Dx small caliber and not well suited for PCI; native RCA amenable to PCI and would restore flow to PLA branches but would jeopardize SVG-RCA - Med Rx  . Deviated nasal septum   . Diabetes mellitus    onset 2013  . Dyslipidemia   . GERD (gastroesophageal reflux disease)   . Hx of Doppler ultrasound    Carotid US (9/13): No ICA stenosis  . Hypertension     Past Surgical History:  Procedure Laterality Date  . CARDIAC CATHETERIZATION    . CORONARY ARTERY BYPASS GRAFT  10/27/2011   Procedure: CORONARY ARTERY BYPASS GRAFTING (CABG);  Surgeon: Kerin Perna, MD;  Location: [facility] OR;  Service: Open Heart Surgery;  Laterality: N/A;  Coronary Artery Bypass Grafting times four using left internal mammary artery and right greater saphenous vein endoscopically harvested  . LEFT HEART CATHETERIZATION WITH CORONARY ANGIOGRAM N/A 10/23/2011   Procedure: LEFT HEART CATHETERIZATION WITH CORONARY ANGIOGRAM;  Surgeon: Quintella Reichert, MD;  Location: [facility] CATH LAB;  Service: Cardiovascular;  Laterality: N/A;  . LEFT HEART CATHETERIZATION WITH CORONARY ANGIOGRAM N/A 11/16/2013   Procedure: LEFT HEART CATHETERIZATION WITH CORONARY ANGIOGRAM;  Surgeon: Micheline Chapman, MD;  Location: [facility] CATH LAB;  Service: Cardiovascular;  Laterality: N/A;  . left knee arthroscopy  2003  . right knee arthroscopy  2006    Current Medications: Outpatient  Medications Prior to Visit  Medication Sig Dispense Refill  . amLODipine (NORVASC) 10 MG tablet Take 1 tablet (10 mg total) by mouth daily. 30 tablet 11  . aspirin 81 MG tablet Take 81 mg by mouth daily.    Marland Kitchen. atorvastatin (LIPITOR) 80 MG tablet Take 1 tablet (80 mg total) by mouth daily. 30 tablet 11  . ezetimibe (ZETIA) 10 MG tablet Take 1 tablet (10 mg total) by mouth daily. 30 tablet 11  . fish oil-omega-3 fatty acids 1000 MG capsule Take 2 g by mouth 2 (two) times daily.     . furosemide (LASIX) 20 MG tablet Take 1 tablet (20 mg  total) by mouth daily. 30 tablet 11  . HUMALOG MIX 75/25 KWIKPEN (75-25) 100 UNIT/ML Kwikpen Inject 100 mLs as directed daily. Inject 30 units in the skin daily    . isosorbide mononitrate (IMDUR) 60 MG 24 hr tablet Take 1 tablet (60 mg total) by mouth daily. 30 tablet 11  . metFORMIN (GLUCOPHAGE) 500 MG tablet Take 1,000 mg by mouth 2 (two) times daily with a meal.    . metoprolol succinate (TOPROL-XL) 100 MG 24 hr tablet TAKE 1 TABLET (100 MG TOTAL) BY MOUTH DAILY. TAKE WITH OR IMMEDIATELY FOLLOWING A MEAL. 30 tablet 8  . metoprolol succinate (TOPROL-XL) 25 MG 24 hr tablet TAKE 1 TABLET (25 MG TOTAL) BY MOUTH DAILY. TAKE WITH OR IMMEDIATELY FOLLOWING A MEAL. 30 tablet 11  . Multiple Vitamin (MULTIVITAMIN WITH MINERALS) TABS Take 1 tablet by mouth daily.    Marland Kitchen. olmesartan (BENICAR) 40 MG tablet Take 1 tablet (40 mg total) by mouth daily. 30 tablet 11  . omeprazole (PRILOSEC) 20 MG capsule Take 20 mg by mouth daily as needed. For acid reflux    . potassium chloride (K-DUR) 10 MEQ tablet Take 1 tablet (10 mEq total) by mouth daily. 30 tablet 6  . pramipexole (MIRAPEX) 0.75 MG tablet Take 1.5 mg by mouth at bedtime.     Marland Kitchen. spironolactone (ALDACTONE) 25 MG tablet Take 1 tablet (25 mg total) by mouth daily. 30 tablet 11  . KLOR-CON M10 10 MEQ tablet Take 1 tablet (10 mEq total) by mouth daily. (Patient not taking: Reported on 10/23/2015) 30 tablet 11   No facility-administered medications prior to visit.      Allergies:   Lisinopril   Social History   Social History  . Marital status: Divorced    Spouse name: N/A  . Number of children: N/A  . Years of education: N/A   Social History Main Topics  . Smoking status: Never Smoker  . Smokeless tobacco: Never Used  . Alcohol use Yes     Comment: 1 beer a month or less  . Drug use: No  . Sexual activity: Not Asked   Other Topics Concern  . None   Social History Narrative  . None     Family History:  The patient's family history includes  Hypertension in his father, mother, and paternal grandfather; Stroke in his father.   ROS:   Please see the history of present illness.    ROS All other systems reviewed and are negative.   PHYSICAL EXAM:   VS:  BP (!) 162/86   Pulse 75   Ht 5\' 9"  (1.753 m)   Wt 252 lb (114.3 kg)   BMI 37.21 kg/m    GEN: Well nourished, well developed, in no acute distress  HEENT: normal  Neck: no JVD, carotid bruits, or masses Cardiac: RRR; no  murmurs, rubs, or gallops,no edema.  Intact distal pulses bilaterally.  Respiratory:  clear to auscultation bilaterally, normal work of breathing GI: soft, nontender, nondistended, + BS MS: no deformity or atrophy  Skin: warm and dry, no rash Neuro:  Alert and Oriented x 3, Strength and sensation are intact Psych: euthymic mood, full affect  Wt Readings from Last 3 Encounters:  10/23/15 252 lb (114.3 kg)  11/28/14 254 lb 12.8 oz (115.6 kg)  04/24/14 246 lb 6.4 oz (111.8 kg)      Studies/Labs Reviewed:   EKG:  EKG is not ordered today.   Recent Labs: 11/28/2014: ALT 16; BUN 19; Creat 1.13; Potassium 3.9; Sodium 135   Lipid Panel    Component Value Date/Time   CHOL 126 11/28/2014 0928   TRIG 107 11/28/2014 0928   HDL 36 (L) 11/28/2014 0928   CHOLHDL 3.5 11/28/2014 0928   VLDL 21 11/28/2014 0928   LDLCALC 69 11/28/2014 0928    Additional studies/ records that were reviewed today include:  none    ASSESSMENT:    1. Coronary artery disease due to lipid rich plaque   2. Essential hypertension   3. Dilated aortic root (HCC)   4. Chronic diastolic heart failure (HCC)   5. Hyperlipidemia      PLAN:  In order of problems listed above:  1. ASCAD s/p CABG with last cath showing 3/4 grafts patent.  He has no angina.  Continue ASA/statin/BB and long acting nitrate. I encouraged him to continue his exercise program. 2. HTN - BP borderline controlled on current meds.  Continue amlodipine/BB/ARB/aldactone.  Check BMET. He says that his BP is  130/57mmhg at home.  I have asked him to check his BP daly 2 hours after his meds and call with the results. 3. Dilated aortic root. Repeat echo to assess for progression. 4. Chronic diastolic CHF - appears euvolemic on exam.  Continue BB/ARB and diuretic.  5. Hyperlipidemia with LDL goal < 70. Continue statin and zetia.  Check FLp and ALT.    Medication Adjustments/Labs and Tests Ordered: Current medicines are reviewed at length with the patient today.  Concerns regarding medicines are outlined above.  Medication changes, Labs and Tests ordered today are listed in the Patient Instructions below.  There are no Patient Instructions on file for this visit.   Signed, Armanda Magic, MD  10/23/2015 10:41 AM    [facility] [address] [place], [place], [place]  [address] Phone: [phone]; Fax: [phone]

## 2015-10-23 NOTE — Patient Instructions (Signed)
Medication Instructions:  Your physician recommends that you continue on your current medications as directed. Please refer to the Current Medication list given to you today.   Labwork: FASTING LABS IN ONE WEEK: BMET, LFTS, Lipids  Testing/Procedures: Your physician has requested that you have an echocardiogram. Echocardiography is a painless test that uses sound waves to create images of your heart. It provides your doctor with information about the size and shape of your heart and how well your heart's chambers and valves are working. This procedure takes approximately one hour. There are no restrictions for this procedure.  Follow-Up: Your physician wants you to follow-up in: 6 months with Dr. Mayford Knifeurner. You will receive a reminder letter in the mail two months in advance. If you don't receive a letter, please call our office to schedule the follow-up appointment.   Any Other Special Instructions Will Be Listed Below (If Applicable). Please check your blood pressure for one week and call with results.    If you need a refill on your cardiac medications before your next appointment, please call your pharmacy.

## 2015-11-07 ENCOUNTER — Ambulatory Visit (HOSPITAL_COMMUNITY): Payer: Commercial Managed Care - HMO | Attending: Cardiology

## 2015-11-07 ENCOUNTER — Other Ambulatory Visit: Payer: Commercial Managed Care - HMO | Admitting: *Deleted

## 2015-11-07 ENCOUNTER — Other Ambulatory Visit: Payer: Self-pay

## 2015-11-07 DIAGNOSIS — I77810 Thoracic aortic ectasia: Secondary | ICD-10-CM | POA: Diagnosis not present

## 2015-11-07 DIAGNOSIS — I5032 Chronic diastolic (congestive) heart failure: Secondary | ICD-10-CM | POA: Insufficient documentation

## 2015-11-07 DIAGNOSIS — I517 Cardiomegaly: Secondary | ICD-10-CM | POA: Diagnosis not present

## 2015-11-07 DIAGNOSIS — I10 Essential (primary) hypertension: Secondary | ICD-10-CM

## 2015-11-07 DIAGNOSIS — I77819 Aortic ectasia, unspecified site: Secondary | ICD-10-CM | POA: Diagnosis not present

## 2015-11-07 DIAGNOSIS — E785 Hyperlipidemia, unspecified: Secondary | ICD-10-CM

## 2015-11-07 LAB — BASIC METABOLIC PANEL
BUN: 17 mg/dL (ref 7–25)
CHLORIDE: 103 mmol/L (ref 98–110)
CO2: 25 mmol/L (ref 20–31)
Calcium: 8.6 mg/dL (ref 8.6–10.3)
Creat: 1.2 mg/dL — ABNORMAL HIGH (ref 0.70–1.18)
Glucose, Bld: 181 mg/dL — ABNORMAL HIGH (ref 65–99)
POTASSIUM: 4.2 mmol/L (ref 3.5–5.3)
SODIUM: 137 mmol/L (ref 135–146)

## 2015-11-07 LAB — LIPID PANEL
Cholesterol: 204 mg/dL — ABNORMAL HIGH (ref 125–200)
HDL: 37 mg/dL — AB (ref >=40)
LDL CALC: 144 mg/dL — AB (ref <130)
Total CHOL/HDL Ratio: 5.5 Ratio — ABNORMAL HIGH (ref <=5.0)
Triglycerides: 115 mg/dL (ref <150)
VLDL: 23 mg/dL (ref <30)

## 2015-11-07 LAB — HEPATIC FUNCTION PANEL
ALK PHOS: 68 U/L (ref 40–115)
ALT: 14 U/L (ref 9–46)
AST: 12 U/L (ref 10–35)
Albumin: 3.9 g/dL (ref 3.6–5.1)
BILIRUBIN INDIRECT: 0.4 mg/dL (ref 0.2–1.2)
Bilirubin, Direct: 0.1 mg/dL (ref <=0.2)
TOTAL PROTEIN: 6.5 g/dL (ref 6.1–8.1)
Total Bilirubin: 0.5 mg/dL (ref 0.2–1.2)

## 2015-11-08 MED FILL — ATORVASTATIN 80 MG TABLET: 80 | 30 days supply | Qty: 30 | Fill #4

## 2015-11-08 MED FILL — METOPROLOL SUCC ER 100 MG T: 100 | 30 days supply | Qty: 30 | Fill #7

## 2015-11-08 MED FILL — FUROSEMIDE 20 MG TABLET: 20 | 30 days supply | Qty: 30 | Fill #7

## 2015-11-08 MED FILL — KLOR-CON M10 TABLET: 10 | 30 days supply | Qty: 30 | Fill #8

## 2015-11-08 MED FILL — GLIMEPIRIDE 4 MG TABLET: 4 | 30 days supply | Qty: 30 | Fill #1

## 2015-11-08 MED FILL — ISOSORBIDE MN ER 60 MG TAB: 60 | 30 days supply | Qty: 30 | Fill #8

## 2015-11-08 MED FILL — NOVOLOG MIX 70-30 FLEXPEN S: (70-30) 100 | 25 days supply | Qty: 15 | Fill #4

## 2015-11-08 MED FILL — METOPROLOL SUCC ER 25 MG TA: 25 | 30 days supply | Qty: 30 | Fill #8

## 2015-11-13 ENCOUNTER — Ambulatory Visit (INDEPENDENT_AMBULATORY_CARE_PROVIDER_SITE_OTHER): Payer: Commercial Managed Care - HMO | Admitting: Pharmacist

## 2015-11-13 VITALS — BP 140/80 | HR 62 | Ht 69.0 in | Wt 260.2 lb

## 2015-11-13 DIAGNOSIS — E785 Hyperlipidemia, unspecified: Secondary | ICD-10-CM | POA: Diagnosis not present

## 2015-11-13 MED ORDER — EZETIMIBE 10 MG PO TABS: 10.0000 mg | ORAL_TABLET | Freq: Every day | ORAL | 11 refills | Status: DC

## 2015-11-13 MED ORDER — ATORVASTATIN CALCIUM 80 MG PO TABS
80.0000 mg | ORAL_TABLET | Freq: Every day | ORAL | 11 refills | Status: DC
Start: 2015-11-13 — End: 2016-11-21

## 2015-11-13 NOTE — Patient Instructions (Addendum)
Restart your atorvastatin (Lipitor) 80mg  daily and ezetimibe (Zetia) 10mg  daily.  Use the elliptical and weights at the gym a few days a week.  Try to eat more fish and cut back on bacon and cheeseburgers.  Recheck cholesterol in 3 months. Come in Monday December 18th any time after 7:30am for fasting lab work.

## 2015-11-13 NOTE — Progress Notes (Signed)
Patient ID: James [name]                 DOB: 1945-04-21                    MRN: [id number]     HPI: James [name] is a 70 y.o. male patient referred to lipid clinic by Dr. Mayford Knifeurner. PMH is significant for HTN, HLD, and chronic diastolic CHF with chronic LE edema. He also has a history of ASCAD with last cath showing native 3 v CAD and 3/4 grafts patent.The SVG-Dx was occluded and the native Dx was small and not amenable to PCI. There was severe distal RCA disease and this was amenable to PCI to restore flow to the PLA vessels but this would jeopardize the SVG-PDA.Medical therapy was recommended. Pt presents today for further lipid management.  Pt reports that he had been out of his cholesterol medications for 10 days prior to his recent lab work. He has not had problems tolerating his lipid lowering therapy.  Current Medications: atorvastatin 80mg  daily, Zetia 10mg  daily, fish oil 2g daily Intolerances: none Risk Factors: CAD, HTN, age,sex LDL goal: 70mg /dL  Diet: Eggs, bacon, toast and coffee for breakfast. Cheeseburgers from [employer] or sandwiches on wheat bread for lunch. Salads and fish for dinner.  Exercise: Exercise program in the pool - 2 to 3 days a week.  Family History: HTN in his father, mother, and paternal grandfather. Stroke in his father.  Social History: Patient denies tobacco use, alcohol use, and illicit drug use.  Labs: 11/07/15: TC 204, TG 115, HDL 37, LDL 144 (on atorvastatin 80mg  and Zetia 10mg  daily - had been out of meds for 10 days)  Past Medical History:  Diagnosis Date  . Ascending aortic aneurysm (HCC)    cMRI (2/15): Normal LV EF 54%, Mild BAE, Trileaflet Aortic Valve, Upper limits of normal ascending aorta 3.8 cm, Normal aortic arch 2.3 cm with bovine origin of left carotid  . Atrial flutter (HCC)    Post-op with no reoccurence  . Chronic diastolic heart failure (HCC)    Echo (1/15): Left ventricle: The cavity size was mildly dilated. Mild LVH. EF  55% to 60%. Wall motion was normal; Gr 1 diastolic dysfunction Left atrium: The atrium was mildly dilated. Right atrium: The atrium was mildly to moderately dilated. Aortic arch measures 4.8 cm (moderately dilated); suggest CTA or MRA to better assess.  . Coronary artery disease 10/2011   a. s/p cabg;  b. Myoview (9/15):  ant ischemia, poss TID, EF 43%, high risk >> LHC (9/15): Dist LM 50, pLAD 90 then 100, pCFX 50, mCFX 75, oRCA 75, dRCA 95, L-LAD patent, S-Dx 100, S-OM1 patent, S-PDA patent, EF 55% >> native Dx small caliber and not well suited for PCI; native RCA amenable to PCI and would restore flow to PLA branches but would jeopardize SVG-RCA - Med Rx  . Deviated nasal septum   . Diabetes mellitus    onset 2013  . Dyslipidemia   . GERD (gastroesophageal reflux disease)   . Hx of Doppler ultrasound    Carotid US (9/13): No ICA stenosis  . Hypertension     Current Outpatient Prescriptions on File Prior to Visit  Medication Sig Dispense Refill  . amLODipine (NORVASC) 10 MG tablet Take 1 tablet (10 mg total) by mouth daily. 30 tablet 11  . aspirin 81 MG tablet Take 81 mg by mouth daily.    Marland Kitchen. atorvastatin (LIPITOR) 80 MG tablet  Take 1 tablet (80 mg total) by mouth daily. 30 tablet 11  . ezetimibe (ZETIA) 10 MG tablet Take 1 tablet (10 mg total) by mouth daily. 30 tablet 11  . fish oil-omega-3 fatty acids 1000 MG capsule Take 2 g by mouth 2 (two) times daily.     . furosemide (LASIX) 20 MG tablet Take 1 tablet (20 mg total) by mouth daily. 30 tablet 11  . HUMALOG MIX 75/25 KWIKPEN (75-25) 100 UNIT/ML Kwikpen Inject 100 mLs as directed daily. Inject 30 units in the skin daily    . isosorbide mononitrate (IMDUR) 60 MG 24 hr tablet Take 1 tablet (60 mg total) by mouth daily. 30 tablet 11  . metFORMIN (GLUCOPHAGE) 500 MG tablet Take 1,000 mg by mouth 2 (two) times daily with a meal.    . metoprolol succinate (TOPROL-XL) 100 MG 24 hr tablet TAKE 1 TABLET (100 MG TOTAL) BY MOUTH DAILY. TAKE  WITH OR IMMEDIATELY FOLLOWING A MEAL. 30 tablet 8  . metoprolol succinate (TOPROL-XL) 25 MG 24 hr tablet TAKE 1 TABLET (25 MG TOTAL) BY MOUTH DAILY. TAKE WITH OR IMMEDIATELY FOLLOWING A MEAL. 30 tablet 11  . Multiple Vitamin (MULTIVITAMIN WITH MINERALS) TABS Take 1 tablet by mouth daily.    Marland Kitchen olmesartan (BENICAR) 40 MG tablet Take 1 tablet (40 mg total) by mouth daily. 30 tablet 11  . omeprazole (PRILOSEC) 20 MG capsule Take 20 mg by mouth daily as needed. For acid reflux    . potassium chloride (K-DUR) 10 MEQ tablet Take 1 tablet (10 mEq total) by mouth daily. 30 tablet 6  . pramipexole (MIRAPEX) 0.75 MG tablet Take 1.5 mg by mouth at bedtime.     Marland Kitchen spironolactone (ALDACTONE) 25 MG tablet Take 1 tablet (25 mg total) by mouth daily. 30 tablet 11   No current facility-administered medications on file prior to visit.     Allergies  Allergen Reactions  . Lisinopril Cough    Assessment/Plan:  1. Hyperlipidemia - LDL 144mg /dL above goal 70mg /dL given CAD s/p PCI. Pt had been out of his Lipitor 80mg  and Zetia 10mg  for 10 days before most recent labwork was checked. LDL previously 69mg /dL on same regimen when he was adherent to therapy. Sent in refills for Lipitor and Zetia and discussed importance of medication adherence. Pt will work to cut back on red meat in his diet. Set a goal to use weights at the gym more frequently. Will follow up with lipid panel in 3 months. Of note, pt on limited income and PCSK9i would likely be cost prohibitive if further lipid lowering therapy is needed in the future.  Pt signed informed consent for [facility] lipid registry.  Megan E. Supple, PharmD, CPP [facility] [address] [place], [place] [address] Phone: [phone]; Fax: [phone] 11/13/2015 7:27 AM

## 2015-11-20 ENCOUNTER — Encounter: Payer: Self-pay | Admitting: *Deleted

## 2015-11-20 NOTE — Progress Notes (Unsigned)
Late entry:  Subject met inclusion and exclusion criteria. The informed consent form, study requirements and expectations were reviewed with the subject and questions and concerns were addressed prior to the signing of the consent form. The subject verbalized understanding of the trail requirements. The subject agreed to participate in the [place] and signed the informed consent. The informed consent was obtained prior to performance of any protocol-specific procedures for the subject. A copy of the signed informed consent was given to the subject and a copy was placed in the subject's medical record.  Jake Bathe, RN 11/13/2015 1000

## 2015-12-10 ENCOUNTER — Other Ambulatory Visit: Payer: Self-pay | Admitting: Cardiology

## 2015-12-10 DIAGNOSIS — I2510 Atherosclerotic heart disease of native coronary artery without angina pectoris: Secondary | ICD-10-CM

## 2015-12-10 MED FILL — METOPROLOL SUCC ER 25 MG TA: 25 | 30 days supply | Qty: 30 | Fill #9

## 2015-12-10 MED FILL — FUROSEMIDE 20 MG TABLET: 20 | 30 days supply | Qty: 30 | Fill #0

## 2015-12-10 MED FILL — METOPROLOL SUCC ER 100 MG T: 100 | 30 days supply | Qty: 30 | Fill #8

## 2015-12-10 MED FILL — METFORMIN HCL ER 500 MG TAB: 500 | 30 days supply | Qty: 120 | Fill #1

## 2015-12-10 MED FILL — ATORVASTATIN 80 MG TABLET: 80 | 30 days supply | Qty: 30 | Fill #0

## 2015-12-10 MED FILL — KLOR-CON M10 TABLET: 10 | 30 days supply | Qty: 30 | Fill #0

## 2015-12-10 MED FILL — GLIMEPIRIDE 4 MG TABLET: 4 | 30 days supply | Qty: 30 | Fill #2

## 2015-12-10 MED FILL — ISOSORBIDE MN ER 60 MG TAB: 60 | 30 days supply | Qty: 30 | Fill #0

## 2016-01-14 MED FILL — METFORMIN HCL ER 500 MG TAB: 500 | 30 days supply | Qty: 120 | Fill #2

## 2016-01-18 ENCOUNTER — Other Ambulatory Visit: Payer: Self-pay | Admitting: Cardiology

## 2016-01-18 MED FILL — ATORVASTATIN 80 MG TABLET: 80 | 30 days supply | Qty: 30 | Fill #1

## 2016-01-18 MED FILL — METOPROLOL SUCC ER 100 MG T: 100 | 30 days supply | Qty: 30 | Fill #0

## 2016-01-18 MED FILL — ISOSORBIDE MN ER 60 MG TAB: 60 | 30 days supply | Qty: 30 | Fill #1

## 2016-01-18 MED FILL — FUROSEMIDE 20 MG TABLET: 20 | 30 days supply | Qty: 30 | Fill #1

## 2016-01-18 MED FILL — METOPROLOL SUCC ER 25 MG TA: 25 | 30 days supply | Qty: 30 | Fill #0

## 2016-01-18 MED FILL — GLIMEPIRIDE 4 MG TABLET: 4 | 30 days supply | Qty: 30 | Fill #3

## 2016-02-04 ENCOUNTER — Other Ambulatory Visit: Payer: Commercial Managed Care - HMO

## 2016-02-08 DIAGNOSIS — E1121 Type 2 diabetes mellitus with diabetic nephropathy: Secondary | ICD-10-CM | POA: Diagnosis not present

## 2016-02-21 ENCOUNTER — Telehealth: Payer: Self-pay | Admitting: Pharmacist

## 2016-02-21 NOTE — Telephone Encounter (Signed)
Called pt regarding missed lipid panel appt on 12/18. Rescheduled for 1/18 per patient's request.

## 2016-03-06 ENCOUNTER — Other Ambulatory Visit: Payer: Commercial Managed Care - HMO

## 2016-03-06 MED FILL — ISOSORBIDE MN ER 60 MG TAB: 60 | 30 days supply | Qty: 30 | Fill #2

## 2016-03-06 MED FILL — FUROSEMIDE 20 MG TABLET: 20 | 30 days supply | Qty: 30 | Fill #2

## 2016-03-06 MED FILL — KLOR-CON M10 TABLET: 10 | 30 days supply | Qty: 30 | Fill #1

## 2016-03-06 MED FILL — METFORMIN HCL ER 500 MG TAB: 500 | 30 days supply | Qty: 120 | Fill #3

## 2016-03-06 MED FILL — GLIMEPIRIDE 4 MG TABLET: 4 | 30 days supply | Qty: 30 | Fill #4

## 2016-03-06 MED FILL — METOPROLOL SUCC ER 25 MG TA: 25 | 30 days supply | Qty: 30 | Fill #1

## 2016-03-06 MED FILL — ATORVASTATIN 80 MG TABLET: 80 | 30 days supply | Qty: 30 | Fill #2

## 2016-03-07 MED FILL — METOPROLOL SUCC ER 100 MG T: 100 | 30 days supply | Qty: 30 | Fill #1

## 2016-04-01 ENCOUNTER — Other Ambulatory Visit (INDEPENDENT_AMBULATORY_CARE_PROVIDER_SITE_OTHER): Payer: Medicare HMO | Admitting: *Deleted

## 2016-04-01 DIAGNOSIS — I5032 Chronic diastolic (congestive) heart failure: Secondary | ICD-10-CM | POA: Diagnosis not present

## 2016-04-01 DIAGNOSIS — E785 Hyperlipidemia, unspecified: Secondary | ICD-10-CM

## 2016-04-01 LAB — HEPATIC FUNCTION PANEL
ALT: 15 IU/L (ref 0–44)
AST: 7 IU/L (ref 0–40)
Albumin: 4.3 g/dL (ref 3.5–4.8)
Alkaline Phosphatase: 103 IU/L (ref 39–117)
BILIRUBIN TOTAL: 0.7 mg/dL (ref 0.0–1.2)
BILIRUBIN, DIRECT: 0.21 mg/dL (ref 0.00–0.40)
TOTAL PROTEIN: 6.9 g/dL (ref 6.0–8.5)

## 2016-04-01 LAB — LIPID PANEL
CHOLESTEROL TOTAL: 125 mg/dL (ref 100–199)
Chol/HDL Ratio: 3.4 ratio units (ref 0.0–5.0)
HDL: 37 mg/dL — ABNORMAL LOW (ref >39)
LDL Calculated: 66 mg/dL (ref 0–99)
TRIGLYCERIDES: 109 mg/dL (ref 0–149)
VLDL Cholesterol Cal: 22 mg/dL (ref 5–40)

## 2016-04-01 NOTE — Progress Notes (Signed)
Lipid

## 2016-04-09 MED FILL — ISOSORBIDE MN ER 60 MG TAB: 60 | 30 days supply | Qty: 30 | Fill #3

## 2016-04-09 MED FILL — KLOR-CON M10 TABLET: 10 | 30 days supply | Qty: 30 | Fill #2

## 2016-04-09 MED FILL — METOPROLOL SUCC ER 100 MG T: 100 | 30 days supply | Qty: 30 | Fill #2

## 2016-04-09 MED FILL — ATORVASTATIN 80 MG TABLET: 80 | 30 days supply | Qty: 30 | Fill #3

## 2016-04-09 MED FILL — GLIMEPIRIDE 4 MG TABLET: 4 | 30 days supply | Qty: 30 | Fill #5

## 2016-04-09 MED FILL — FUROSEMIDE 20 MG TABLET: 20 | 30 days supply | Qty: 30 | Fill #3

## 2016-04-09 MED FILL — METOPROLOL SUCC ER 25 MG TA: 25 | 30 days supply | Qty: 30 | Fill #2

## 2016-04-09 MED FILL — METFORMIN HCL ER 500 MG TAB: 500 | 30 days supply | Qty: 120 | Fill #4

## 2016-05-09 MED FILL — METOPROLOL SUCC ER 100 MG T: 100 | 30 days supply | Qty: 30 | Fill #3

## 2016-05-09 MED FILL — ATORVASTATIN 80 MG TABLET: 80 | 30 days supply | Qty: 30 | Fill #4

## 2016-05-09 MED FILL — GLIMEPIRIDE 4 MG TABLET: 4 | 30 days supply | Qty: 30 | Fill #0

## 2016-05-09 MED FILL — ISOSORBIDE MN ER 60 MG TAB: 60 | 30 days supply | Qty: 30 | Fill #4

## 2016-05-09 MED FILL — METFORMIN HCL ER 500 MG TAB: 500 | 30 days supply | Qty: 120 | Fill #5

## 2016-05-09 MED FILL — KLOR-CON M10 TABLET: 10 | 30 days supply | Qty: 30 | Fill #3

## 2016-05-09 MED FILL — METOPROLOL SUCC ER 25 MG TA: 25 | 30 days supply | Qty: 30 | Fill #3

## 2016-06-04 DIAGNOSIS — E1121 Type 2 diabetes mellitus with diabetic nephropathy: Secondary | ICD-10-CM | POA: Diagnosis not present

## 2016-06-04 DIAGNOSIS — Z7984 Long term (current) use of oral hypoglycemic drugs: Secondary | ICD-10-CM | POA: Diagnosis not present

## 2016-06-04 DIAGNOSIS — I25119 Atherosclerotic heart disease of native coronary artery with unspecified angina pectoris: Secondary | ICD-10-CM | POA: Diagnosis not present

## 2016-06-04 DIAGNOSIS — Z0000 Encounter for general adult medical examination without abnormal findings: Secondary | ICD-10-CM | POA: Diagnosis not present

## 2016-06-04 DIAGNOSIS — I130 Hypertensive heart and chronic kidney disease with heart failure and stage 1 through stage 4 chronic kidney disease, or unspecified chronic kidney disease: Secondary | ICD-10-CM | POA: Diagnosis not present

## 2016-06-13 MED FILL — ISOSORBIDE MN ER 60 MG TAB: 60 | 30 days supply | Qty: 30 | Fill #5

## 2016-06-13 MED FILL — KLOR-CON M10 TABLET: 10 | 30 days supply | Qty: 30 | Fill #4

## 2016-06-13 MED FILL — GLIMEPIRIDE 4 MG TABLET: 4 | 30 days supply | Qty: 30 | Fill #1

## 2016-06-13 MED FILL — METOPROLOL SUCC ER 100 MG T: 100 | 30 days supply | Qty: 30 | Fill #4

## 2016-06-13 MED FILL — FUROSEMIDE 20 MG TABLET: 20 | 30 days supply | Qty: 30 | Fill #4

## 2016-06-13 MED FILL — ATORVASTATIN 80 MG TABLET: 80 | 30 days supply | Qty: 30 | Fill #5

## 2016-06-13 MED FILL — METOPROLOL SUCC ER 25 MG TA: 25 | 30 days supply | Qty: 30 | Fill #4

## 2016-06-17 MED FILL — METFORMIN HCL ER 500 MG TAB: 500 | 30 days supply | Qty: 120 | Fill #0

## 2016-07-15 MED FILL — METOPROLOL SUCC ER 25 MG TA: 25 | 28 days supply | Qty: 28 | Fill #5

## 2016-07-15 MED FILL — GLIMEPIRIDE 4 MG TABLET: 4 | 28 days supply | Qty: 28 | Fill #2

## 2016-07-15 MED FILL — FUROSEMIDE 20 MG TABLET: 20 | 28 days supply | Qty: 28 | Fill #5

## 2016-07-15 MED FILL — ISOSORBIDE MN ER 60 MG TAB: 60 | 28 days supply | Qty: 28 | Fill #6

## 2016-07-15 MED FILL — ATORVASTATIN 80 MG TABLET: 80 | 28 days supply | Qty: 28 | Fill #6

## 2016-07-15 MED FILL — KLOR-CON M10 TABLET: 10 | 28 days supply | Qty: 28 | Fill #5

## 2016-07-16 MED FILL — METFORMIN HCL ER 500 MG TAB: 500 | 26 days supply | Qty: 104 | Fill #1

## 2016-07-18 MED FILL — METOPROLOL SUCC ER 100 MG T: 100 | 30 days supply | Qty: 30 | Fill #5

## 2016-08-05 MED FILL — PRAMIPEXOLE 0.75 MG TABLET: 0.75 | 30 days supply | Qty: 60 | Fill #0

## 2016-08-18 ENCOUNTER — Other Ambulatory Visit: Payer: Self-pay | Admitting: Cardiology

## 2016-08-18 DIAGNOSIS — I2510 Atherosclerotic heart disease of native coronary artery without angina pectoris: Secondary | ICD-10-CM

## 2016-08-18 MED FILL — METFORMIN HCL ER 500 MG TAB: 500 | 26 days supply | Qty: 104 | Fill #2

## 2016-08-18 MED FILL — GLIMEPIRIDE 4 MG TABLET: 4 | 28 days supply | Qty: 28 | Fill #3

## 2016-08-18 MED FILL — METOPROLOL SUCC ER 25 MG TA: 25 | 28 days supply | Qty: 28 | Fill #6

## 2016-08-18 MED FILL — METOPROLOL SUCC ER 100 MG T: 100 | 30 days supply | Qty: 30 | Fill #6

## 2016-08-18 MED FILL — KLOR-CON M10 TABLET: 10 | 24 days supply | Qty: 24 | Fill #6

## 2016-08-19 ENCOUNTER — Other Ambulatory Visit: Payer: Self-pay | Admitting: *Deleted

## 2016-09-03 MED FILL — PRAMIPEXOLE 0.75 MG TABLET: 0.75 | 30 days supply | Qty: 60 | Fill #1

## 2016-09-03 MED FILL — ISOSORBIDE MN ER 60 MG TAB: 60 | 30 days supply | Qty: 30 | Fill #0

## 2016-09-03 MED FILL — FUROSEMIDE 20 MG TAB: 20 | 30 days supply | Qty: 30 | Fill #6

## 2016-09-03 MED FILL — ATORVASTATIN 80 MG TABLET: 80 | 30 days supply | Qty: 30 | Fill #7

## 2016-09-17 MED FILL — METOPROLOL SUCC ER 100 MG T: 100 | 30 days supply | Qty: 30 | Fill #7

## 2016-09-17 MED FILL — GLIMEPIRIDE 4 MG TABLET: 4 | 30 days supply | Qty: 30 | Fill #4

## 2016-09-17 MED FILL — METFORMIN HCL ER 500 MG TAB: 500 | 30 days supply | Qty: 120 | Fill #3

## 2016-09-17 MED FILL — METOPROLOL SUCC ER 25 MG TA: 25 | 30 days supply | Qty: 30 | Fill #7

## 2016-09-18 DIAGNOSIS — Z794 Long term (current) use of insulin: Secondary | ICD-10-CM | POA: Diagnosis not present

## 2016-09-18 DIAGNOSIS — E1121 Type 2 diabetes mellitus with diabetic nephropathy: Secondary | ICD-10-CM | POA: Diagnosis not present

## 2016-09-18 DIAGNOSIS — N183 Chronic kidney disease, stage 3 (moderate): Secondary | ICD-10-CM | POA: Diagnosis not present

## 2016-09-18 DIAGNOSIS — I25119 Atherosclerotic heart disease of native coronary artery with unspecified angina pectoris: Secondary | ICD-10-CM | POA: Diagnosis not present

## 2016-09-18 DIAGNOSIS — I130 Hypertensive heart and chronic kidney disease with heart failure and stage 1 through stage 4 chronic kidney disease, or unspecified chronic kidney disease: Secondary | ICD-10-CM | POA: Diagnosis not present

## 2016-09-19 ENCOUNTER — Other Ambulatory Visit: Payer: Self-pay | Admitting: Cardiology

## 2016-09-19 MED ORDER — KLOR-CON M10 10 MEQ PO TBCR: 10.0000 meq | EXTENDED_RELEASE_TABLET | Freq: Every day | ORAL | 1 refills | Status: DC

## 2016-09-24 MED FILL — AMOXICILLIN 500 MG CAPSULE: 500 | 10 days supply | Qty: 20 | Fill #0

## 2016-10-01 MED FILL — PRAMIPEXOLE 0.75 MG TABLET: 0.75 | 30 days supply | Qty: 60 | Fill #2

## 2016-10-01 MED FILL — KLOR-CON M10 TABLET: 10 | 30 days supply | Qty: 30 | Fill #0

## 2016-10-21 MED FILL — GLIMEPIRIDE 4 MG TABLET: 4 | 30 days supply | Qty: 30 | Fill #5

## 2016-10-21 MED FILL — METOPROLOL SUCC ER 100 MG T: 100 | 30 days supply | Qty: 30 | Fill #8

## 2016-10-21 MED FILL — METFORMIN HCL ER 500 MG TAB: 500 | 30 days supply | Qty: 120 | Fill #4

## 2016-10-21 MED FILL — METOPROLOL SUCC ER 25 MG TA: 25 | 30 days supply | Qty: 30 | Fill #8

## 2016-10-21 MED FILL — ATORVASTATIN 80 MG TABLET: 80 | 30 days supply | Qty: 30 | Fill #8

## 2016-10-21 MED FILL — ISOSORBIDE MN ER 60 MG TAB: 60 | 30 days supply | Qty: 30 | Fill #1

## 2016-10-30 ENCOUNTER — Other Ambulatory Visit: Payer: Self-pay | Admitting: Cardiology

## 2016-10-30 MED FILL — FUROSEMIDE 20 MG TAB: 20 | 30 days supply | Qty: 30 | Fill #0

## 2016-10-30 MED FILL — KLOR-CON M10 TABLET: 10 | 30 days supply | Qty: 30 | Fill #1

## 2016-10-30 MED FILL — PRAMIPEXOLE 0.75 MG TABLET: 0.75 | 30 days supply | Qty: 60 | Fill #3

## 2016-11-21 ENCOUNTER — Other Ambulatory Visit: Payer: Self-pay | Admitting: Cardiology

## 2016-11-21 DIAGNOSIS — I2510 Atherosclerotic heart disease of native coronary artery without angina pectoris: Secondary | ICD-10-CM

## 2016-11-21 MED ORDER — METOPROLOL SUCCINATE ER 25 MG PO TB24: ORAL_TABLET | ORAL | 1 refills | Status: DC

## 2016-11-21 MED FILL — GLIMEPIRIDE 4 MG TABLET: 4 | 30 days supply | Qty: 30 | Fill #0

## 2016-11-21 MED FILL — METFORMIN HCL ER 500 MG TAB: 500 | 30 days supply | Qty: 120 | Fill #5

## 2016-11-21 MED FILL — ATORVASTATIN 80 MG TABLET: 80 | 30 days supply | Qty: 30 | Fill #0

## 2016-11-21 MED FILL — METOPROLOL SUCC ER 100 MG T: 100 | 30 days supply | Qty: 30 | Fill #0

## 2016-11-21 MED FILL — METOPROLOL SUCC ER 25 MG TA: 25 | 30 days supply | Qty: 30 | Fill #0

## 2016-11-21 MED FILL — ISOSORBIDE MN ER 60 MG TAB: 60 | 30 days supply | Qty: 30 | Fill #0

## 2016-12-03 ENCOUNTER — Other Ambulatory Visit: Payer: Self-pay | Admitting: Cardiology

## 2016-12-03 MED FILL — NOVOLOG MIX 70-30 FLEXPEN S: (70-30) 100 | 22 days supply | Qty: 15 | Fill #0

## 2016-12-09 ENCOUNTER — Encounter: Payer: Self-pay | Admitting: Cardiology

## 2016-12-12 MED FILL — KLOR-CON M10 TABLET: 10 | 30 days supply | Qty: 30 | Fill #0

## 2016-12-12 MED FILL — PRAMIPEXOLE 0.75 MG TABLET: 0.75 | 30 days supply | Qty: 60 | Fill #4

## 2016-12-18 DIAGNOSIS — I5032 Chronic diastolic (congestive) heart failure: Secondary | ICD-10-CM | POA: Diagnosis not present

## 2016-12-18 DIAGNOSIS — G2581 Restless legs syndrome: Secondary | ICD-10-CM | POA: Diagnosis not present

## 2016-12-18 DIAGNOSIS — E782 Mixed hyperlipidemia: Secondary | ICD-10-CM | POA: Diagnosis not present

## 2016-12-18 DIAGNOSIS — E7801 Familial hypercholesterolemia: Secondary | ICD-10-CM | POA: Diagnosis not present

## 2016-12-18 DIAGNOSIS — Z794 Long term (current) use of insulin: Secondary | ICD-10-CM | POA: Diagnosis not present

## 2016-12-18 DIAGNOSIS — E1121 Type 2 diabetes mellitus with diabetic nephropathy: Secondary | ICD-10-CM | POA: Diagnosis not present

## 2016-12-18 DIAGNOSIS — R5383 Other fatigue: Secondary | ICD-10-CM | POA: Diagnosis not present

## 2016-12-18 DIAGNOSIS — N183 Chronic kidney disease, stage 3 (moderate): Secondary | ICD-10-CM | POA: Diagnosis not present

## 2016-12-18 DIAGNOSIS — Z23 Encounter for immunization: Secondary | ICD-10-CM | POA: Diagnosis not present

## 2016-12-23 NOTE — Progress Notes (Signed)
Cardiology Office Note:    Date:  12/24/2016   ID:  Advait, Buice 1945-08-05, MRN [id number]  PCP:  Joycelyn Rua, MD  Cardiologist:  Armanda Magic, MD   Referring MD: Joycelyn Rua, MD   Chief Complaint  Patient presents with  . Hypertension  . Congestive Heart Failure    History of Present Illness:    James [name] is a 71 y.o. male with a hx of HTN, dyslipidemia and chronic diastolic CHF with chronic LE edema. He also has a history of ASCAD with last cath showingnative 3v CAD and 3/4 grafts patent.The SVG-Dx was occluded and the native Dx was small and not amenable to PCI. There was severe distal RCA disease and this was amenable to PCI to restore flow to the PLA vessels but this would jeopardize the SVG-PDA. Med Rx was recommended.  He also has a mildly dilated ascending aorta at 3.8cm by cMRI in 2015.    He is here today for followup and is doing well.  he denies any chest pain or pressure, SOB, DOE (except with long walking or extreme exertion, PND, orthopnea, dizziness, palpitations or syncope. He has chronic LE edema which is stable. He is compliant with his meds and is tolerating meds with no SE.      Past Medical History:  Diagnosis Date  . Ascending aortic aneurysm (HCC)    cMRI (2/15): Normal LV EF 54%, Mild BAE, Trileaflet Aortic Valve, Upper limits of normal ascending aorta 3.8 cm, Normal aortic arch 2.3 cm with bovine origin of left carotid  . Atrial flutter (HCC)    Post-op with no reoccurence  . Chronic diastolic heart failure (HCC)    Echo (1/15): Left ventricle: The cavity size was mildly dilated. Mild LVH. EF 55% to 60%. Wall motion was normal; Gr 1 diastolic dysfunction Left atrium: The atrium was mildly dilated. Right atrium: The atrium was mildly to moderately dilated. Aortic arch measures 4.8 cm (moderately dilated); suggest CTA or MRA to better assess.  . Coronary artery disease 10/2011   a. s/p cabg;  b. Myoview (9/15):  ant ischemia, poss  TID, EF 43%, high risk >> LHC (9/15): Dist LM 50, pLAD 90 then 100, pCFX 50, mCFX 75, oRCA 75, dRCA 95, L-LAD patent, S-Dx 100, S-OM1 patent, S-PDA patent, EF 55% >> native Dx small caliber and not well suited for PCI; native RCA amenable to PCI and would restore flow to PLA branches but would jeopardize SVG-RCA - Med Rx  . Deviated nasal septum   . Diabetes mellitus    onset 2013  . Dyslipidemia   . GERD (gastroesophageal reflux disease)   . Hx of Doppler ultrasound    Carotid US (9/13): No ICA stenosis  . Hypertension     Past Surgical History:  Procedure Laterality Date  . CARDIAC CATHETERIZATION    . left knee arthroscopy  2003  . right knee arthroscopy  2006    Current Medications: Current Meds  Medication Sig  . amLODipine (NORVASC) 10 MG tablet Take 1 tablet (10 mg total) by mouth daily.  Marland Kitchen aspirin 81 MG tablet Take 81 mg by mouth daily.  Marland Kitchen atorvastatin (LIPITOR) 80 MG tablet TAKE 1 TABLET (80 MG TOTAL) BY MOUTH DAILY.  Marland Kitchen ezetimibe (ZETIA) 10 MG tablet Take 1 tablet (10 mg total) by mouth daily.  . fish oil-omega-3 fatty acids 1000 MG capsule Take 2 g by mouth 2 (two) times daily.   . furosemide (LASIX) 20 MG tablet TAKE  1 TABLET (20 MG TOTAL) BY MOUTH DAILY.  Marland Kitchen. HUMALOG MIX 75/25 KWIKPEN (75-25) 100 UNIT/ML Kwikpen Inject 100 mLs as directed daily. Inject 30 units in the skin daily  . isosorbide mononitrate (IMDUR) 60 MG 24 hr tablet TAKE 1 TABLET (60 MG TOTAL) BY MOUTH DAILY.  Marland Kitchen. KLOR-CON M10 10 MEQ tablet Take 1 tablet (10 mEq total) by mouth daily.  . metFORMIN (GLUCOPHAGE) 500 MG tablet Take 1,000 mg by mouth 2 (two) times daily with a meal.  . metoprolol succinate (TOPROL-XL) 100 MG 24 hr tablet TAKE 1 TABLET BY MOUTH DAILY TAKE WITH OR IMMEDIATELY FOLLOWING A MEAL  . metoprolol succinate (TOPROL-XL) 25 MG 24 hr tablet TAKE 1 TABLET (25 MG TOTAL) BY MOUTH DAILY. TAKE WITH OR IMMEDIATELY FOLLOWING A MEAL.  . Multiple Vitamin (MULTIVITAMIN WITH MINERALS) TABS Take 1  tablet by mouth daily.  Marland Kitchen. olmesartan (BENICAR) 40 MG tablet Take 1 tablet (40 mg total) by mouth daily.  . potassium chloride (K-DUR) 10 MEQ tablet Take 1 tablet (10 mEq total) by mouth daily.  . pramipexole (MIRAPEX) 0.75 MG tablet Take 1.5 mg by mouth at bedtime.   Marland Kitchen. spironolactone (ALDACTONE) 25 MG tablet Take 1 tablet (25 mg total) by mouth daily.     Allergies:   Lisinopril   Social History   Socioeconomic History  . Marital status: Divorced    Spouse name: None  . Number of children: None  . Years of education: None  . Highest education level: None  Social Needs  . Financial resource strain: None  . Food insecurity - worry: None  . Food insecurity - inability: None  . Transportation needs - medical: None  . Transportation needs - non-medical: None  Occupational History  . None  Tobacco Use  . Smoking status: Never Smoker  . Smokeless tobacco: Never Used  Substance and Sexual Activity  . Alcohol use: Yes    Comment: 1 beer a month or less  . Drug use: No  . Sexual activity: None  Other Topics Concern  . None  Social History Narrative  . None     Family History: The patient's family history includes Hypertension in his father, mother, and paternal grandfather; Stroke in his father. There is no history of Heart attack.  ROS:   Please see the history of present illness.    ROS  All other systems reviewed and negative.   EKGs/Labs/Other Studies Reviewed:    The following studies were reviewed today: none  EKG:  EKG is  ordered today.  The ekg ordered today demonstrates NSR at 81bpm with nonspecific ST abnormality  Recent Labs: 04/01/2016: ALT 15   Recent Lipid Panel    Component Value Date/Time   CHOL 125 04/01/2016 0000   TRIG 109 04/01/2016 0000   HDL 37 (L) 04/01/2016 0000   CHOLHDL 3.4 04/01/2016 0000   CHOLHDL 5.5 (H) 11/07/2015 0824   VLDL 23 11/07/2015 0824   LDLCALC 66 04/01/2016 0000    Physical Exam:    VS:  BP (!) 168/104   Pulse 81    Ht 5\' 9"  (1.753 m)   Wt 268 lb 12.8 oz (121.9 kg)   SpO2 94%   BMI 39.69 kg/m     Wt Readings from Last 3 Encounters:  12/24/16 268 lb 12.8 oz (121.9 kg)  11/13/15 260 lb 4 oz (118 kg)  10/23/15 252 lb (114.3 kg)     GEN:  Well nourished, well developed in no acute distress HEENT: Normal  NECK: No JVD; No carotid bruits LYMPHATICS: No lymphadenopathy CARDIAC: RRR, no murmurs, rubs, gallops RESPIRATORY:  Clear to auscultation without rales, wheezing or rhonchi  ABDOMEN: Soft, non-tender, non-distended MUSCULOSKELETAL:  Trace edema; No deformity  SKIN: Warm and dry NEUROLOGIC:  Alert and oriented x 3 PSYCHIATRIC:  Normal affect   ASSESSMENT:    1. Coronary artery disease involving native coronary artery of native heart without angina pectoris   2. Essential hypertension   3. Dilated aortic root (HCC)   4. Chronic diastolic heart failure (HCC)   5. Pure hypercholesterolemia    PLAN:    In order of problems listed above:  1.  ASCAD s/p remote CABG with last cath showingnative 3v CAD and 3/4 grafts patent.The SVG-Dx was occluded and the native Dx was small and not amenable to PCI. There was severe distal RCA disease and this was amenable to PCI to restore flow to the PLA vessels but this would jeopardize the SVG-PDA.  He has not had any anginal symptoms since I saw her last.  He will continue on ASA 81mg  daily, BB, long acting nitrate, statin.  2.  HTN - his BP is elevated today in the office but he has not taken his meds yet.  At home it runs 140/4780mmHg.  He will continue on Toprol XL 125mg  daily, Benicar 40mg  daily, spironolactone 25mg  daily, amlodipine 10mg  daily.  Creatinine was stable at 1.2 on 12/18/2016.    3.  Dilated aortic root - I will repeat a 2D echo to assess aortic size. Continue with aggressive BP control.   4.  Chronic diastolic CHF - he appears euvolemic on exam today and his weight is stable.  He will continue on Lasix 20mg  daily.   5.  Hyperlipidemia with  LDL goal < 70 - he will continue on atorvastatin 80mg  daily.  His LDL is near goal at 72 earlier this month.   Medication Adjustments/Labs and Tests Ordered: Current medicines are reviewed at length with the patient today.  Concerns regarding medicines are outlined above.  Orders Placed This Encounter  Procedures  . EKG 12-Lead   No orders of the defined types were placed in this encounter.   Signed, Armanda Magicraci Turner, MD  12/24/2016 8:44 AM    [facility]

## 2016-12-24 ENCOUNTER — Encounter: Payer: Self-pay | Admitting: Cardiology

## 2016-12-24 ENCOUNTER — Ambulatory Visit (INDEPENDENT_AMBULATORY_CARE_PROVIDER_SITE_OTHER): Payer: Medicare HMO | Admitting: Cardiology

## 2016-12-24 VITALS — BP 168/104 | HR 81 | Ht 69.0 in | Wt 268.8 lb

## 2016-12-24 DIAGNOSIS — I2510 Atherosclerotic heart disease of native coronary artery without angina pectoris: Secondary | ICD-10-CM | POA: Diagnosis not present

## 2016-12-24 DIAGNOSIS — I5032 Chronic diastolic (congestive) heart failure: Secondary | ICD-10-CM | POA: Diagnosis not present

## 2016-12-24 DIAGNOSIS — I77810 Thoracic aortic ectasia: Secondary | ICD-10-CM | POA: Diagnosis not present

## 2016-12-24 DIAGNOSIS — E7800 Pure hypercholesterolemia, unspecified: Secondary | ICD-10-CM | POA: Diagnosis not present

## 2016-12-24 DIAGNOSIS — I10 Essential (primary) hypertension: Secondary | ICD-10-CM

## 2016-12-24 NOTE — Patient Instructions (Signed)

## 2016-12-25 MED FILL — METOPROLOL SUCC ER 25 MG TA: 25 | 30 days supply | Qty: 30 | Fill #1

## 2016-12-25 MED FILL — METOPROLOL SUCC ER 100 MG T: 100 | 30 days supply | Qty: 30 | Fill #1

## 2017-01-02 MED FILL — ULTICARE PEN NDL 8MM 31G: 31G X 8 MM | 50 days supply | Qty: 100 | Fill #0

## 2017-01-07 MED FILL — ISOSORBIDE MN ER 60 MG TAB: 60 | 30 days supply | Qty: 30 | Fill #1

## 2017-01-07 MED FILL — FUROSEMIDE 20 MG TAB: 20 | 30 days supply | Qty: 30 | Fill #1

## 2017-01-07 MED FILL — GLIMEPIRIDE 4 MG TABLET: 4 | 30 days supply | Qty: 30 | Fill #1

## 2017-01-07 MED FILL — PRAMIPEXOLE 0.75 MG TABLET: 0.75 | 30 days supply | Qty: 60 | Fill #5

## 2017-01-07 MED FILL — METFORMIN HCL ER 500 MG TAB: 500 | 30 days supply | Qty: 120 | Fill #0

## 2017-01-07 MED FILL — KLOR-CON M10 TABLET: 10 | 30 days supply | Qty: 30 | Fill #1

## 2017-01-07 MED FILL — ATORVASTATIN 80 MG TABLET: 80 | 30 days supply | Qty: 30 | Fill #1

## 2017-01-28 ENCOUNTER — Other Ambulatory Visit: Payer: Self-pay | Admitting: Cardiology

## 2017-01-28 MED FILL — METOPROLOL SUCC ER 100 MG T: 100 | 30 days supply | Qty: 30 | Fill #0

## 2017-01-28 MED FILL — METOPROLOL SUCC ER 25 MG TA: 25 | 30 days supply | Qty: 30 | Fill #0

## 2017-01-28 MED FILL — NOVOLOG MIX 70-30 FLEXPEN S: (70-30) 100 | 22 days supply | Qty: 15 | Fill #1

## 2017-02-09 MED FILL — METFORMIN HCL ER 500 MG TAB: 500 | 30 days supply | Qty: 120 | Fill #1

## 2017-02-11 MED FILL — PRAMIPEXOLE 0.75 MG TABLET: 0.75 | 30 days supply | Qty: 60 | Fill #0

## 2017-02-20 ENCOUNTER — Other Ambulatory Visit: Payer: Self-pay | Admitting: Cardiology

## 2017-02-20 DIAGNOSIS — I2510 Atherosclerotic heart disease of native coronary artery without angina pectoris: Secondary | ICD-10-CM

## 2017-02-20 MED FILL — NOVOLOG MIX 70-30 FLEXPEN S: (70-30) 100 | 30 days supply | Qty: 21 | Fill #0

## 2017-02-20 MED FILL — FUROSEMIDE 20 MG TAB: 20 | 90 days supply | Qty: 90 | Fill #0

## 2017-02-20 MED FILL — GLIMEPIRIDE 4 MG TABLET: 4 | 30 days supply | Qty: 30 | Fill #2

## 2017-02-20 MED FILL — ATORVASTATIN 80 MG TABLET: 80 | 90 days supply | Qty: 90 | Fill #0

## 2017-02-20 MED FILL — KLOR-CON M10 TABLET: 10 | 30 days supply | Qty: 30 | Fill #0

## 2017-02-20 MED FILL — ISOSORBIDE MN ER 60 MG TAB: 60 | 90 days supply | Qty: 90 | Fill #0

## 2017-03-04 MED FILL — METOPROLOL SUCC ER 25 MG TA: 25 | 30 days supply | Qty: 30 | Fill #1

## 2017-03-04 MED FILL — METOPROLOL SUCC ER 100 MG T: 100 | 30 days supply | Qty: 30 | Fill #1

## 2017-03-10 MED FILL — PRAMIPEXOLE 0.75 MG TABLET: 0.75 | 30 days supply | Qty: 60 | Fill #1

## 2017-03-20 MED FILL — ULTICARE PEN NDL 8MM 31G: 31G X 8 MM | 50 days supply | Qty: 100 | Fill #0

## 2017-03-20 MED FILL — METFORMIN HCL ER 500 MG TAB: 500 | 30 days supply | Qty: 120 | Fill #2

## 2017-04-03 MED FILL — NOVOLOG MIX 70-30 FLEXPEN S: (70-30) 100 | 30 days supply | Qty: 21 | Fill #1

## 2017-04-08 MED FILL — GLIMEPIRIDE 4 MG TABLET: 4 | 30 days supply | Qty: 30 | Fill #3

## 2017-04-08 MED FILL — KLOR-CON M10 TABLET: 10 | 30 days supply | Qty: 30 | Fill #1

## 2017-04-08 MED FILL — METOPROLOL SUCC ER 100 MG T: 100 | 30 days supply | Qty: 30 | Fill #2

## 2017-04-08 MED FILL — METOPROLOL SUCCINATE ER 25: 25 | 30 days supply | Qty: 30 | Fill #2

## 2017-04-08 MED FILL — PRAMIPEXOLE 0.75 MG TABLET: 0.75 | 30 days supply | Qty: 60 | Fill #2

## 2017-04-23 MED FILL — METFORMIN HCL ER 500 MG TAB: 500 | 30 days supply | Qty: 120 | Fill #3

## 2017-05-07 DIAGNOSIS — I130 Hypertensive heart and chronic kidney disease with heart failure and stage 1 through stage 4 chronic kidney disease, or unspecified chronic kidney disease: Secondary | ICD-10-CM | POA: Diagnosis not present

## 2017-05-07 DIAGNOSIS — E1165 Type 2 diabetes mellitus with hyperglycemia: Secondary | ICD-10-CM | POA: Diagnosis not present

## 2017-05-07 DIAGNOSIS — E1122 Type 2 diabetes mellitus with diabetic chronic kidney disease: Secondary | ICD-10-CM | POA: Diagnosis not present

## 2017-05-07 DIAGNOSIS — Z6841 Body Mass Index (BMI) 40.0 and over, adult: Secondary | ICD-10-CM | POA: Diagnosis not present

## 2017-05-07 DIAGNOSIS — N183 Chronic kidney disease, stage 3 (moderate): Secondary | ICD-10-CM | POA: Diagnosis not present

## 2017-05-07 DIAGNOSIS — Z794 Long term (current) use of insulin: Secondary | ICD-10-CM | POA: Diagnosis not present

## 2017-05-07 DIAGNOSIS — E1121 Type 2 diabetes mellitus with diabetic nephropathy: Secondary | ICD-10-CM | POA: Diagnosis not present

## 2017-05-07 DIAGNOSIS — I25119 Atherosclerotic heart disease of native coronary artery with unspecified angina pectoris: Secondary | ICD-10-CM | POA: Diagnosis not present

## 2017-05-07 DIAGNOSIS — G2581 Restless legs syndrome: Secondary | ICD-10-CM | POA: Diagnosis not present

## 2017-05-07 MED FILL — METOPROLOL SUCCINATE ER 100: 100 | 30 days supply | Qty: 30 | Fill #3

## 2017-05-07 MED FILL — KLOR-CON M10 TABLET: 10 | 30 days supply | Qty: 30 | Fill #2

## 2017-05-07 MED FILL — PRAMIPEXOLE 0.75 MG TABLET: 0.75 | 30 days supply | Qty: 60 | Fill #3

## 2017-05-07 MED FILL — ULTICARE PEN NDL 8MM 31G: 31G X 8 MM | 50 days supply | Qty: 100 | Fill #0

## 2017-05-07 MED FILL — ONE TOUCH ULTRASOFT LANCETS: 50 days supply | Qty: 100 | Fill #0

## 2017-05-07 MED FILL — GLIMEPIRIDE 4 MG TABLET: 4 | 30 days supply | Qty: 30 | Fill #4

## 2017-05-07 MED FILL — METOPROLOL SUCCINATE ER 25: 25 | 30 days supply | Qty: 30 | Fill #3

## 2017-05-08 MED FILL — NOVOLOG MIX 70-30 FLEXPEN S: (70-30) 100 | 30 days supply | Qty: 21 | Fill #0

## 2017-05-25 MED FILL — METFORMIN HCL ER 500 MG TAB: 500 | 30 days supply | Qty: 120 | Fill #4

## 2017-06-05 MED FILL — GLIMEPIRIDE 4 MG TABLET: 4 | 30 days supply | Qty: 30 | Fill #0

## 2017-06-05 MED FILL — METOPROLOL SUCCINATE ER 25: 25 | 30 days supply | Qty: 30 | Fill #4

## 2017-06-05 MED FILL — METOPROLOL SUCCINATE ER 100: 100 | 30 days supply | Qty: 30 | Fill #4

## 2017-06-05 MED FILL — ATORVASTATIN 80 MG TABLET: 80 | 90 days supply | Qty: 90 | Fill #1

## 2017-06-05 MED FILL — ISOSORBIDE MN ER 60 MG TAB: 60 | 90 days supply | Qty: 90 | Fill #1

## 2017-06-05 MED FILL — KLOR-CON M10 TABLET: 10 | 30 days supply | Qty: 30 | Fill #3

## 2017-06-05 MED FILL — PRAMIPEXOLE 0.75 MG TABLET: 0.75 | 30 days supply | Qty: 60 | Fill #0

## 2017-06-15 MED FILL — FUROSEMIDE 20 MG TAB: 20 | 90 days supply | Qty: 90 | Fill #1

## 2017-06-15 MED FILL — NOVOLOG MIX 70-30 FLEXPEN S: (70-30) 100 | 30 days supply | Qty: 21 | Fill #1

## 2017-06-18 ENCOUNTER — Emergency Department (HOSPITAL_COMMUNITY)
Admission: EM | Admit: 2017-06-18 | Discharge: 2017-06-19 | Disposition: A | Discharge status: Home / Self Care | Payer: Medicare HMO | Source: Home / Self Care | Attending: Emergency Medicine | Admitting: Emergency Medicine

## 2017-06-18 ENCOUNTER — Encounter: Payer: Self-pay | Admitting: Cardiology

## 2017-06-18 ENCOUNTER — Other Ambulatory Visit: Payer: Self-pay

## 2017-06-18 ENCOUNTER — Encounter (HOSPITAL_COMMUNITY): Payer: Self-pay | Admitting: Emergency Medicine

## 2017-06-18 ENCOUNTER — Ambulatory Visit: Payer: Medicare HMO | Admitting: Cardiology

## 2017-06-18 ENCOUNTER — Telehealth: Payer: Self-pay

## 2017-06-18 ENCOUNTER — Emergency Department (HOSPITAL_COMMUNITY): Payer: Medicare HMO

## 2017-06-18 VITALS — BP 142/88 | HR 62 | Ht 69.0 in | Wt 283.0 lb

## 2017-06-18 DIAGNOSIS — Z951 Presence of aortocoronary bypass graft: Secondary | ICD-10-CM | POA: Diagnosis not present

## 2017-06-18 DIAGNOSIS — R791 Abnormal coagulation profile: Secondary | ICD-10-CM

## 2017-06-18 DIAGNOSIS — N179 Acute kidney failure, unspecified: Secondary | ICD-10-CM | POA: Diagnosis not present

## 2017-06-18 DIAGNOSIS — I5022 Chronic systolic (congestive) heart failure: Secondary | ICD-10-CM | POA: Diagnosis not present

## 2017-06-18 DIAGNOSIS — R140 Abdominal distension (gaseous): Secondary | ICD-10-CM | POA: Diagnosis not present

## 2017-06-18 DIAGNOSIS — R0602 Shortness of breath: Secondary | ICD-10-CM | POA: Insufficient documentation

## 2017-06-18 DIAGNOSIS — I700 Atherosclerosis of aorta: Secondary | ICD-10-CM | POA: Diagnosis not present

## 2017-06-18 DIAGNOSIS — E1159 Type 2 diabetes mellitus with other circulatory complications: Secondary | ICD-10-CM | POA: Diagnosis present

## 2017-06-18 DIAGNOSIS — E7800 Pure hypercholesterolemia, unspecified: Secondary | ICD-10-CM

## 2017-06-18 DIAGNOSIS — B954 Other streptococcus as the cause of diseases classified elsewhere: Secondary | ICD-10-CM | POA: Diagnosis not present

## 2017-06-18 DIAGNOSIS — R600 Localized edema: Secondary | ICD-10-CM | POA: Diagnosis not present

## 2017-06-18 DIAGNOSIS — I872 Venous insufficiency (chronic) (peripheral): Secondary | ICD-10-CM | POA: Diagnosis present

## 2017-06-18 DIAGNOSIS — Z8249 Family history of ischemic heart disease and other diseases of the circulatory system: Secondary | ICD-10-CM | POA: Diagnosis not present

## 2017-06-18 DIAGNOSIS — Z823 Family history of stroke: Secondary | ICD-10-CM | POA: Diagnosis not present

## 2017-06-18 DIAGNOSIS — I5032 Chronic diastolic (congestive) heart failure: Secondary | ICD-10-CM

## 2017-06-18 DIAGNOSIS — E119 Type 2 diabetes mellitus without complications: Secondary | ICD-10-CM

## 2017-06-18 DIAGNOSIS — I5033 Acute on chronic diastolic (congestive) heart failure: Secondary | ICD-10-CM | POA: Diagnosis not present

## 2017-06-18 DIAGNOSIS — I4891 Unspecified atrial fibrillation: Secondary | ICD-10-CM | POA: Diagnosis not present

## 2017-06-18 DIAGNOSIS — I77810 Thoracic aortic ectasia: Secondary | ICD-10-CM

## 2017-06-18 DIAGNOSIS — Z79899 Other long term (current) drug therapy: Secondary | ICD-10-CM

## 2017-06-18 DIAGNOSIS — E785 Hyperlipidemia, unspecified: Secondary | ICD-10-CM | POA: Diagnosis present

## 2017-06-18 DIAGNOSIS — A400 Sepsis due to streptococcus, group A: Secondary | ICD-10-CM | POA: Diagnosis not present

## 2017-06-18 DIAGNOSIS — Z7984 Long term (current) use of oral hypoglycemic drugs: Secondary | ICD-10-CM

## 2017-06-18 DIAGNOSIS — N183 Chronic kidney disease, stage 3 (moderate): Secondary | ICD-10-CM | POA: Diagnosis not present

## 2017-06-18 DIAGNOSIS — I2510 Atherosclerotic heart disease of native coronary artery without angina pectoris: Secondary | ICD-10-CM | POA: Diagnosis not present

## 2017-06-18 DIAGNOSIS — G9341 Metabolic encephalopathy: Secondary | ICD-10-CM | POA: Diagnosis not present

## 2017-06-18 DIAGNOSIS — R05 Cough: Secondary | ICD-10-CM | POA: Diagnosis not present

## 2017-06-18 DIAGNOSIS — I130 Hypertensive heart and chronic kidney disease with heart failure and stage 1 through stage 4 chronic kidney disease, or unspecified chronic kidney disease: Secondary | ICD-10-CM | POA: Diagnosis not present

## 2017-06-18 DIAGNOSIS — I110 Hypertensive heart disease with heart failure: Secondary | ICD-10-CM

## 2017-06-18 DIAGNOSIS — J9811 Atelectasis: Secondary | ICD-10-CM | POA: Diagnosis not present

## 2017-06-18 DIAGNOSIS — E1165 Type 2 diabetes mellitus with hyperglycemia: Secondary | ICD-10-CM | POA: Diagnosis not present

## 2017-06-18 DIAGNOSIS — R6520 Severe sepsis without septic shock: Secondary | ICD-10-CM | POA: Diagnosis not present

## 2017-06-18 DIAGNOSIS — R7881 Bacteremia: Secondary | ICD-10-CM | POA: Diagnosis not present

## 2017-06-18 DIAGNOSIS — Z7982 Long term (current) use of aspirin: Secondary | ICD-10-CM

## 2017-06-18 DIAGNOSIS — L0390 Cellulitis, unspecified: Secondary | ICD-10-CM | POA: Diagnosis not present

## 2017-06-18 DIAGNOSIS — E1122 Type 2 diabetes mellitus with diabetic chronic kidney disease: Secondary | ICD-10-CM | POA: Diagnosis not present

## 2017-06-18 DIAGNOSIS — K219 Gastro-esophageal reflux disease without esophagitis: Secondary | ICD-10-CM | POA: Diagnosis present

## 2017-06-18 DIAGNOSIS — E118 Type 2 diabetes mellitus with unspecified complications: Secondary | ICD-10-CM | POA: Diagnosis not present

## 2017-06-18 DIAGNOSIS — R509 Fever, unspecified: Secondary | ICD-10-CM | POA: Diagnosis not present

## 2017-06-18 DIAGNOSIS — A419 Sepsis, unspecified organism: Secondary | ICD-10-CM | POA: Diagnosis not present

## 2017-06-18 DIAGNOSIS — R7989 Other specified abnormal findings of blood chemistry: Secondary | ICD-10-CM

## 2017-06-18 DIAGNOSIS — I129 Hypertensive chronic kidney disease with stage 1 through stage 4 chronic kidney disease, or unspecified chronic kidney disease: Secondary | ICD-10-CM | POA: Diagnosis not present

## 2017-06-18 DIAGNOSIS — L03116 Cellulitis of left lower limb: Secondary | ICD-10-CM | POA: Diagnosis not present

## 2017-06-18 DIAGNOSIS — Z6841 Body Mass Index (BMI) 40.0 and over, adult: Secondary | ICD-10-CM | POA: Diagnosis not present

## 2017-06-18 DIAGNOSIS — Z6838 Body mass index (BMI) 38.0-38.9, adult: Secondary | ICD-10-CM | POA: Diagnosis not present

## 2017-06-18 DIAGNOSIS — Z8679 Personal history of other diseases of the circulatory system: Secondary | ICD-10-CM | POA: Diagnosis not present

## 2017-06-18 DIAGNOSIS — I10 Essential (primary) hypertension: Secondary | ICD-10-CM

## 2017-06-18 DIAGNOSIS — I491 Atrial premature depolarization: Secondary | ICD-10-CM | POA: Diagnosis present

## 2017-06-18 DIAGNOSIS — E11649 Type 2 diabetes mellitus with hypoglycemia without coma: Secondary | ICD-10-CM | POA: Diagnosis not present

## 2017-06-18 DIAGNOSIS — R911 Solitary pulmonary nodule: Secondary | ICD-10-CM | POA: Diagnosis present

## 2017-06-18 DIAGNOSIS — R748 Abnormal levels of other serum enzymes: Secondary | ICD-10-CM | POA: Diagnosis not present

## 2017-06-18 LAB — CBC
HEMATOCRIT: 42 % (ref 37.5–51.0)
Hemoglobin: 14.5 g/dL (ref 13.0–17.7)
MCH: 29.3 pg (ref 26.6–33.0)
MCHC: 34.5 g/dL (ref 31.5–35.7)
MCV: 85 fL (ref 79–97)
Platelets: 252 10*3/uL (ref 150–379)
RBC: 4.95 x10E6/uL (ref 4.14–5.80)
RDW: 13.9 % (ref 12.3–15.4)
WBC: 11.3 10*3/uL — AB (ref 3.4–10.8)

## 2017-06-18 LAB — CBC WITH DIFFERENTIAL/PLATELET
BASOS PCT: 0 %
Basophils Absolute: 0 10*3/uL (ref 0.0–0.1)
EOS PCT: 3 %
Eosinophils Absolute: 0.3 10*3/uL (ref 0.0–0.7)
HCT: 42.6 % (ref 39.0–52.0)
Hemoglobin: 14.5 g/dL (ref 13.0–17.0)
LYMPHS ABS: 2.1 10*3/uL (ref 0.7–4.0)
Lymphocytes Relative: 20 %
MCH: 29.5 pg (ref 26.0–34.0)
MCHC: 34 g/dL (ref 30.0–36.0)
MCV: 86.6 fL (ref 78.0–100.0)
MONOS PCT: 8 %
Monocytes Absolute: 0.8 10*3/uL (ref 0.1–1.0)
Neutro Abs: 7 10*3/uL (ref 1.7–7.7)
Neutrophils Relative %: 69 %
PLATELETS: 233 10*3/uL (ref 150–400)
RBC: 4.92 MIL/uL (ref 4.22–5.81)
RDW: 13.2 % (ref 11.5–15.5)
WBC: 10.2 10*3/uL (ref 4.0–10.5)

## 2017-06-18 LAB — BASIC METABOLIC PANEL
ANION GAP: 10 (ref 5–15)
BUN/Creatinine Ratio: 11 (ref 10–24)
BUN: 14 mg/dL (ref 8–27)
BUN: 14 mg/dL (ref 6–20)
CHLORIDE: 102 mmol/L (ref 101–111)
CO2: 24 mmol/L (ref 20–29)
CO2: 29 mmol/L (ref 22–32)
CREATININE: 1.25 mg/dL (ref 0.76–1.27)
CREATININE: 1.31 mg/dL — AB (ref 0.61–1.24)
Calcium: 9.4 mg/dL (ref 8.6–10.2)
Calcium: 9.3 mg/dL (ref 8.9–10.3)
Chloride: 101 mmol/L (ref 96–106)
GFR calc Af Amer: 66 mL/min/{1.73_m2} (ref >59)
GFR calc non Af Amer: 57 mL/min/{1.73_m2} — ABNORMAL LOW (ref >59)
GFR calc non Af Amer: 53 mL/min — ABNORMAL LOW (ref >60)
GLUCOSE: 68 mg/dL (ref 65–99)
Glucose, Bld: 94 mg/dL (ref 65–99)
Potassium: 4.3 mmol/L (ref 3.5–5.2)
Potassium: 3.7 mmol/L (ref 3.5–5.1)
Sodium: 140 mmol/L (ref 134–144)
Sodium: 141 mmol/L (ref 135–145)

## 2017-06-18 LAB — TSH: TSH: 3.34 u[IU]/mL (ref 0.450–4.500)

## 2017-06-18 LAB — D-DIMER, QUANTITATIVE: D-DIMER: 1.08 mg/L FEU — ABNORMAL HIGH (ref 0.00–0.49)

## 2017-06-18 LAB — PRO B NATRIURETIC PEPTIDE: NT-Pro BNP: 274 pg/mL (ref 0–376)

## 2017-06-18 MED ORDER — IOPAMIDOL (ISOVUE-370) INJECTION 76%
100.0000 mL | Freq: Once | INTRAVENOUS | Status: AC | PRN
  Administered 2017-06-18: 67 mL via INTRAVENOUS

## 2017-06-18 NOTE — Patient Instructions (Addendum)
Medication Instructions:  Your physician has recommended you make the following change in your medication:  INCREASE: Lasix to 40 mg (2 tablets) once a day for 3 days, THEN take Lasix 20 mg one time a day  If you need a refill on your cardiac medications, please contact your pharmacy first.  Labwork: Today for kidney function test, complete blood count, thyroid, BNP, and DDimer   Testing/Procedures: Your physician has requested that you have an echocardiogram. Echocardiography is a painless test that uses sound waves to create images of your heart. It provides your doctor with information about the size and shape of your heart and how well your heart's chambers and valves are working. This procedure takes approximately one hour. There are no restrictions for this procedure.  Your physician has requested that you have a 2 day lexiscan myoview. For further information please visit [email or web address] Please follow instruction sheet, as given.  A chest x-ray takes a picture of the organs and structures inside the chest, including the heart, lungs, and blood vessels. This test can show several things, including, whether the heart is enlarges; whether fluid is building up in the lungs; and whether pacemaker / defibrillator leads are still in place. At [address] [place], [place]   Follow-Up: Your physician wants you to follow-up in: 1 year with Dr. Mayford Knife. You will receive a reminder letter in the mail two months in advance. If you don't receive a letter, please call our office to schedule the follow-up appointment.  Any Other Special Instructions Will Be Listed Below (If Applicable).   Thank you for choosing [facility]    Lyda Perone, RN  [phone]  If you need a refill on your cardiac medications before your next appointment, please call your pharmacy.

## 2017-06-18 NOTE — Progress Notes (Signed)
Cardiology Office Note:    Date:  06/18/2017   ID:  James [name], James [name] [birth year], MRN [id number]  PCP:  Joycelyn Rua, MD  Cardiologist:  No primary care provider on file.    Referring MD: Joycelyn Rua, MD   Chief Complaint  Patient presents with  . Coronary Artery Disease  . Hypertension  . Hyperlipidemia  . Congestive Heart Failure    History of Present Illness:    James [name] is a 72 y.o. male with a hx of HTN, dyslipidemia and chronic diastolic CHF with chronic LE edema. He also has a history of ASCAD with last cath showingnative 3v CAD and 3/4 grafts patent.The SVG-Dx was occluded and the native Dx was small and not amenable to PCI. There was severe distal RCA disease and this was amenable to PCI to restore flow to the PLA vessels but this would jeopardize the SVG-PDA. Med Rx was recommended.  He also has a mildly dilated ascending aorta at 3.8cm by cMRI in 2015.    He is here today for followup and is doing well.  He denies any chest pain or pressure, PND, orthopnea,  dizziness, palpitations or syncope. He has been having problems with SOB recently but has gained 15lbs.  He says that his feet have been swelling and has had decreased stamina to the point that he gets worn out just walking in the house. He is compliant with his meds and is tolerating meds with no SE.    Past Medical History:  Diagnosis Date  . Ascending aortic aneurysm (HCC)    cMRI (2/15): Normal LV EF 54%, Mild BAE, Trileaflet Aortic Valve, Upper limits of normal ascending aorta 3.8 cm, Normal aortic arch 2.3 cm with bovine origin of left carotid  . Atrial flutter (HCC)    Post-op with no reoccurence  . Chronic diastolic heart failure (HCC)    Echo (1/15): Left ventricle: The cavity size was mildly dilated. Mild LVH. EF 55% to 60%. Wall motion was normal; Gr 1 diastolic dysfunction Left atrium: The atrium was mildly dilated. Right atrium: The atrium was mildly to moderately dilated. Aortic arch  measures 4.8 cm (moderately dilated); suggest CTA or MRA to better assess.  . Coronary artery disease 10/2011   a. s/p cabg;  b. Myoview (9/15):  ant ischemia, poss TID, EF 43%, high risk >> LHC (9/15): Dist LM 50, pLAD 90 then 100, pCFX 50, mCFX 75, oRCA 75, dRCA 95, L-LAD patent, S-Dx 100, S-OM1 patent, S-PDA patent, EF 55% >> native Dx small caliber and not well suited for PCI; native RCA amenable to PCI and would restore flow to PLA branches but would jeopardize SVG-RCA - Med Rx  . Deviated nasal septum   . Diabetes mellitus    onset 2013  . Dyslipidemia   . GERD (gastroesophageal reflux disease)   . Hx of Doppler ultrasound    Carotid US (9/13): No ICA stenosis  . Hypertension     Past Surgical History:  Procedure Laterality Date  . CARDIAC CATHETERIZATION    . CORONARY ARTERY BYPASS GRAFT  10/27/2011   Procedure: CORONARY ARTERY BYPASS GRAFTING (CABG);  Surgeon: Kerin Perna, MD;  Location: [facility] OR;  Service: Open Heart Surgery;  Laterality: N/A;  Coronary Artery Bypass Grafting times four using left internal mammary artery and right greater saphenous vein endoscopically harvested  . LEFT HEART CATHETERIZATION WITH CORONARY ANGIOGRAM N/A 10/23/2011   Procedure: LEFT HEART CATHETERIZATION WITH CORONARY ANGIOGRAM;  Surgeon: Quintella Reichert, MD;  Location: [facility] CATH LAB;  Service: Cardiovascular;  Laterality: N/A;  . LEFT HEART CATHETERIZATION WITH CORONARY ANGIOGRAM N/A 11/16/2013   Procedure: LEFT HEART CATHETERIZATION WITH CORONARY ANGIOGRAM;  Surgeon: Micheline Chapman, MD;  Location: [facility] CATH LAB;  Service: Cardiovascular;  Laterality: N/A;  . left knee arthroscopy  2003  . right knee arthroscopy  2006    Current Medications: Current Meds  Medication Sig  . amLODipine (NORVASC) 10 MG tablet Take 1 tablet (10 mg total) by mouth daily.  Marland Kitchen aspirin 81 MG tablet Take 81 mg by mouth daily.  Marland Kitchen atorvastatin (LIPITOR) 80 MG tablet TAKE 1 TABLET (80 MG TOTAL) BY MOUTH DAILY.  Marland Kitchen ezetimibe  (ZETIA) 10 MG tablet Take 1 tablet (10 mg total) by mouth daily.  . fish oil-omega-3 fatty acids 1000 MG capsule Take 2 g by mouth 2 (two) times daily.   . furosemide (LASIX) 20 MG tablet TAKE 1 TABLET (20 MG TOTAL) BY MOUTH DAILY.  Marland Kitchen glimepiride (AMARYL) 4 MG tablet Take 4 mg by mouth daily.  Marland Kitchen HUMALOG MIX 75/25 KWIKPEN (75-25) 100 UNIT/ML Kwikpen Inject 100 mLs as directed daily. Inject 30 units in the skin daily  . isosorbide mononitrate (IMDUR) 60 MG 24 hr tablet TAKE 1 TABLET (60 MG TOTAL) BY MOUTH DAILY.  . metFORMIN (GLUCOPHAGE) 500 MG tablet Take 1,000 mg by mouth 2 (two) times daily with a meal.  . metoprolol succinate (TOPROL-XL) 100 MG 24 hr tablet TAKE 1 TABLET BY MOUTH DAILY TAKE WITH OR IMMEDIATELY FOLLOWING A MEAL  . metoprolol succinate (TOPROL-XL) 25 MG 24 hr tablet TAKE 1 TABLET (25 MG TOTAL) BY MOUTH DAILY. TAKE WITH OR IMMEDIATELY FOLLOWING A MEAL.  . Multiple Vitamin (MULTIVITAMIN WITH MINERALS) TABS Take 1 tablet by mouth daily.  Marland Kitchen olmesartan (BENICAR) 40 MG tablet Take 1 tablet (40 mg total) by mouth daily.  . potassium chloride (K-DUR) 10 MEQ tablet Take 1 tablet (10 mEq total) by mouth daily.  . pramipexole (MIRAPEX) 0.75 MG tablet Take 1.5 mg by mouth at bedtime.   Marland Kitchen spironolactone (ALDACTONE) 25 MG tablet Take 1 tablet (25 mg total) by mouth daily.  . [DISCONTINUED] KLOR-CON M10 10 MEQ tablet TAKE 1 TABLET (10 MEQ TOTAL) BY MOUTH DAILY.     Allergies:   Lisinopril   Social History   Socioeconomic History  . Marital status: Divorced    Spouse name: Not on file  . Number of children: Not on file  . Years of education: Not on file  . Highest education level: Not on file  Occupational History  . Not on file  Social Needs  . Financial resource strain: Not on file  . Food insecurity:    Worry: Not on file    Inability: Not on file  . Transportation needs:    Medical: Not on file    Non-medical: Not on file  Tobacco Use  . Smoking status: Never Smoker  .  Smokeless tobacco: Never Used  Substance and Sexual Activity  . Alcohol use: Yes    Comment: 1 beer a month or less  . Drug use: No  . Sexual activity: Not on file  Lifestyle  . Physical activity:    Days per week: Not on file    Minutes per session: Not on file  . Stress: Not on file  Relationships  . Social connections:    Talks on phone: Not on file    Gets together: Not on file    Attends religious service: Not  on file    Active member of club or organization: Not on file    Attends meetings of clubs or organizations: Not on file    Relationship status: Not on file  Other Topics Concern  . Not on file  Social History Narrative  . Not on file     Family History: The patient's family history includes Hypertension in his father, mother, and paternal grandfather; Stroke in his father. There is no history of Heart attack.  ROS:   Please see the history of present illness.    ROS  All other systems reviewed and negative.   EKGs/Labs/Other Studies Reviewed:    The following studies were reviewed today: none  EKG:  EKG is  ordered today.  The ekg ordered today demonstrates NSR at 62bpm with no ST changes  Recent Labs: No results found for requested labs within last 8760 hours.   Recent Lipid Panel    Component Value Date/Time   CHOL 125 04/01/2016 0000   TRIG 109 04/01/2016 0000   HDL 37 (L) 04/01/2016 0000   CHOLHDL 3.4 04/01/2016 0000   CHOLHDL 5.5 (H) 11/07/2015 0824   VLDL 23 11/07/2015 0824   LDLCALC 66 04/01/2016 0000    Physical Exam:    VS:  BP (!) 142/88   Pulse 62   Ht  (1.753 m)   Wt 283 lb (128.4 kg)   SpO2 95%   BMI 41.79 kg/m     Wt Readings from Last 3 Encounters:  06/18/17 283 lb (128.4 kg)  12/24/16 268 lb 12.8 oz (121.9 kg)  11/13/15 260 lb 4 oz (118 kg)     GEN:  Well nourished, well developed in no acute distress HEENT: Normal NECK: No JVD; No carotid bruits LYMPHATICS: No lymphadenopathy CARDIAC: RRR, no murmurs, rubs,  gallops RESPIRATORY:  Clear to auscultation without rales, wheezing or rhonchi  ABDOMEN: Soft, non-tender, non-distended MUSCULOSKELETAL:  1+ edema; No deformity  SKIN: Warm and dry NEUROLOGIC:  Alert and oriented x 3 PSYCHIATRIC:  Normal affect   ASSESSMENT:    1. Coronary artery disease involving native coronary artery of native heart without angina pectoris   2. Essential hypertension   3. Chronic diastolic heart failure (HCC)   4. Dilated aortic root (HCC)   5. Pure hypercholesterolemia   6. SOB (shortness of breath)    PLAN:    In order of problems listed above:  1.  ASCAD - s/p CABG with last cath showingnative 3v CAD and 3/4 grafts patent.The SVG-Dx was occluded and the native Dx was small and not amenable to PCI. There was severe distal RCA disease and this was amenable to PCI to restore flow to the PLA vessels but this would jeopardize the SVG-PDA.  He remains asymptomatic with no CP or SOB.  He will continue on ASA  daily, Imdur  daily, BB and high dose statin.   2.  HTN - BP is well controlled on exam.  He will continue on Toprol XL  daily, amlodipine  daily, spironolactone  daily and olmesartan  daily.    3.  Chronic diastolic CHF - he is complaining of increased SOB, weight gain and LE edema. He has gained 15lbs since November. I will check a BNP today.  Repeat 2D echo to reassess LVF.   I will increase lasix to  daily x 3 days and then back to  daly and continue  spironolactone  daily. Creatinine was stable at 1.2 on 05/07/2017 and K+ 4.3.  4.  Dilated aortic root - 38mm on echo 10/2015.  I will repeat echo to make sure this has not progressed.  5.  Hyperlipidemia with LDL goal < 70.  He will continue on high dose Lipitor  daily and Zetia  daily.  LDL was 72 on 12/18/2016  6.  SOB - likely secondary to weight gain recently.  He has gained 15lbs in the past 6 months.  He had not been exercising recently.  He says that he has no  stamina to do anything including going up and down stairs.  I have recommended that we proceed with 2 day Lexiscan myoview to rule out ischemia.    Medication Adjustments/Labs and Tests Ordered: Current medicines are reviewed at length with the patient today.  Concerns regarding medicines are outlined above.  No orders of the defined types were placed in this encounter.  No orders of the defined types were placed in this encounter.   Signed, Armanda Magic, MD  06/18/2017 9:26 AM    [facility]

## 2017-06-18 NOTE — ED Triage Notes (Signed)
Pt st's he was called by his MD and told to come to ED for possible PE.  Pt st's he had blood drawn and was told it was abnormal.

## 2017-06-18 NOTE — ED Provider Notes (Signed)
[facility] EMERGENCY DEPARTMENT Provider Note   CSN: [id number] Arrival date & time: 06/18/17  1740     History   Chief Complaint Chief Complaint  Patient presents with  . Abnormal Lab    HPI James [name] is a 72 y.o. male.  The history is provided by the patient and medical records. No language interpreter was used.  Abnormal Lab   James [name] is a 72 y.o. male  with a PMH as listed below including CHF, CAD s/p CABG, HTN who presents to the Emergency Department by recommendations of cardiology for elevated d-dimer performed as an outpatient today.  Patient reports that he has been experiencing shortness of breath with exertion for the last several months.  He does not have any shortness of breath at rest.  He feels as if he is still able to perform his typical activities, but will get winded and worn out while doing so.  He also endorses bilateral leg swelling for the last several months.  He saw cardiology today who scheduled him for a Lexiscan Myoview and ordered CBC, BMP, TSH, BNP and d-dimer.  BNP was normal at 274.  D-dimer elevated at 1.08.  Cardiology called him and informed him to come to the emergency department for a CT scan to rule out blood clots.  He denies any shortness of breath currently while sitting the bed.  He denies ever having any chest pain with this.  Past Medical History:  Diagnosis Date  . Ascending aortic aneurysm (HCC)    cMRI (2/15): Normal LV EF 54%, Mild BAE, Trileaflet Aortic Valve, Upper limits of normal ascending aorta 3.8 cm, Normal aortic arch 2.3 cm with bovine origin of left carotid  . Atrial flutter (HCC)    Post-op with no reoccurence  . Chronic diastolic heart failure (HCC)    Echo (1/15): Left ventricle: The cavity size was mildly dilated. Mild LVH. EF 55% to 60%. Wall motion was normal; Gr 1 diastolic dysfunction Left atrium: The atrium was mildly dilated. Right atrium: The atrium was mildly to moderately dilated.  Aortic arch measures 4.8 cm (moderately dilated); suggest CTA or MRA to better assess.  . Coronary artery disease 10/2011   a. s/p cabg;  b. Myoview (9/15):  ant ischemia, poss TID, EF 43%, high risk >> LHC (9/15): Dist LM 50, pLAD 90 then 100, pCFX 50, mCFX 75, oRCA 75, dRCA 95, L-LAD patent, S-Dx 100, S-OM1 patent, S-PDA patent, EF 55% >> native Dx small caliber and not well suited for PCI; native RCA amenable to PCI and would restore flow to PLA branches but would jeopardize SVG-RCA - Med Rx  . Deviated nasal septum   . Diabetes mellitus    onset 2013  . Dyslipidemia   . GERD (gastroesophageal reflux disease)   . Hx of Doppler ultrasound    Carotid US (9/13): No ICA stenosis  . Hypertension     Patient Active Problem List   Diagnosis Date Noted  . SOB (shortness of breath) 06/18/2017  . Abnormal nuclear cardiac imaging test 11/14/2013  . Abnormal EKG 03/09/2013  . Chronic diastolic heart failure (HCC) 03/09/2013  . Dilated aortic root (HCC)   . Diastolic dysfunction   . Edema 02/09/2013  . Encounter for long-term (current) use of other medications 02/09/2013  . Hypertension 10/24/2011  . Coronary artery disease 10/24/2011  . Hyperlipidemia 10/24/2011  . DM (diabetes mellitus), type 2, uncontrolled with complications (HCC) 10/24/2011  . GERD (gastroesophageal reflux disease) 10/24/2011  Past Surgical History:  Procedure Laterality Date  . CARDIAC CATHETERIZATION    . CORONARY ARTERY BYPASS GRAFT  10/27/2011   Procedure: CORONARY ARTERY BYPASS GRAFTING (CABG);  Surgeon: Kerin Perna, MD;  Location: [facility] OR;  Service: Open Heart Surgery;  Laterality: N/A;  Coronary Artery Bypass Grafting times four using left internal mammary artery and right greater saphenous vein endoscopically harvested  . LEFT HEART CATHETERIZATION WITH CORONARY ANGIOGRAM N/A 10/23/2011   Procedure: LEFT HEART CATHETERIZATION WITH CORONARY ANGIOGRAM;  Surgeon: Quintella Reichert, MD;  Location: [facility] CATH LAB;   Service: Cardiovascular;  Laterality: N/A;  . LEFT HEART CATHETERIZATION WITH CORONARY ANGIOGRAM N/A 11/16/2013   Procedure: LEFT HEART CATHETERIZATION WITH CORONARY ANGIOGRAM;  Surgeon: Micheline Chapman, MD;  Location[facility] C[facility] LAB;  Service: Cardiovascular;  Laterality: N/A;  . left knee arthroscopy  2003  . right knee arthroscopy  2006        Home Medications    Prior to Admission medications   Medication Sig Start Date End Date Taking? Authorizing Provider  amLODipine (NORVASC) 10 MG tablet Take 1 tablet (10 mg total) by mouth daily. 11/28/14  Yes Quintella Reichert, MD  aspirin 81 MG tablet Take 81 mg by mouth daily.   Yes [provider]  atorvastatin (LIPITOR) 80 MG tablet TAKE 1 TABLET (80 MG TOTAL) BY MOUTH DAILY. 02/20/17  Yes Turner, Cornelious Bryant, MD  cholecalciferol (VITAMIN D) 1000 units tablet Take 2,000 Units by mouth daily.   Yes [provider]  ezetimibe (ZETIA) 10 MG tablet Take 1 tablet (10 mg total) by mouth daily. 11/13/15  Yes Turner, Cornelious Bryant, MD  fish oil-omega-3 fatty acids 1000 MG capsule Take 2 g by mouth 2 (two) times daily.    Yes [provider]  furosemide (LASIX) 20 MG tablet TAKE 1 TABLET (20 MG TOTAL) BY MOUTH DAILY. 02/20/17  Yes Turner, Traci R, MD  glimepiride (AMARYL) 4 MG tablet Take 4 mg by mouth daily. 06/05/17  Yes [provider]  HUMALOG MIX 75/25 KWIKPEN (75-25) 100 UNIT/ML Kwikpen Inject 100 mLs as directed daily. Inject 30 units in the skin daily 03/07/14  Yes [provider]  isosorbide mononitrate (IMDUR) 60 MG 24 hr tablet TAKE 1 TABLET (60 MG TOTAL) BY MOUTH DAILY. 02/20/17  Yes Turner, Cornelious Bryant, MD  lisinopril (PRINIVIL,ZESTRIL) 10 MG tablet Take 10 mg by mouth daily.   Yes [provider]  metFORMIN (GLUCOPHAGE) 500 MG tablet Take 1,000 mg by mouth 2 (two) times daily with a meal.   Yes [provider]  metoprolol succinate (TOPROL-XL) 25 MG 24 hr tablet TAKE 1 TABLET (25 MG TOTAL) BY MOUTH  DAILY. TAKE WITH OR IMMEDIATELY FOLLOWING A MEAL. Patient taking differently: TAKE 3 TABLETS (125 MG TOTAL) BY MOUTH DAILY. TAKE WITH OR IMMEDIATELY FOLLOWING A MEAL. 01/28/17  Yes Turner, Cornelious Bryant, MD  Multiple Vitamin (MULTIVITAMIN WITH MINERALS) TABS Take 1 tablet by mouth daily.   Yes [provider]  olmesartan (BENICAR) 40 MG tablet Take 1 tablet (40 mg total) by mouth daily. 11/28/14  Yes Turner, Cornelious Bryant, MD  potassium chloride (K-DUR) 10 MEQ tablet Take 1 tablet (10 mEq total) by mouth daily. 12/09/13  Yes Weaver, Scott T, PA-C  pramipexole (MIRAPEX) 0.75 MG tablet Take 1.5 mg by mouth at bedtime.    Yes [provider]  pravastatin (PRAVACHOL) 40 MG tablet Take 40 mg by mouth daily.   Yes [provider]  spironolactone (ALDACTONE) 25 MG  tablet Take 1 tablet (25 mg total) by mouth daily. 11/28/14  Yes Turner, Cornelious Bryant, MD  metoprolol succinate (TOPROL-XL) 100 MG 24 hr tablet TAKE 1 TABLET BY MOUTH DAILY TAKE WITH OR IMMEDIATELY FOLLOWING A MEAL 01/28/17   Quintella Reichert, MD    Family History Family History  Problem Relation Age of Onset  . Hypertension Mother   . Stroke Father   . Hypertension Father   . Hypertension Paternal Grandfather   . Heart attack Neg Hx     Social History Social History   Tobacco Use  . Smoking status: Never Smoker  . Smokeless tobacco: Never Used  Substance Use Topics  . Alcohol use: Yes    Comment: 1 beer a month or less  . Drug use: No     Allergies   Lisinopril   Review of Systems Review of Systems  Respiratory: Positive for shortness of breath. Negative for cough and wheezing.   All other systems reviewed and are negative.    Physical Exam Updated Vital Signs BP (!) 173/93   Pulse 74   Temp 99.7 F (37.6 C) (Oral)   Resp (!) 35   SpO2 96%   Physical Exam  Constitutional: He is oriented to person, place, and time. He appears well-developed and well-nourished. No distress.  HENT:  Head:  Normocephalic and atraumatic.  Cardiovascular: Normal rate, regular rhythm and normal heart sounds.  No murmur heard. Pulmonary/Chest: Effort normal and breath sounds normal. No respiratory distress.  Abdominal: Soft. He exhibits no distension. There is no tenderness.  Musculoskeletal: He exhibits edema (1+). He exhibits no tenderness.  Neurological: He is alert and oriented to person, place, and time.  Skin: Skin is warm and dry.  Nursing note and vitals reviewed.    ED Treatments / Results  Labs (all labs ordered are listed, but only abnormal results are displayed) Labs Reviewed  BASIC METABOLIC PANEL - Abnormal; Notable for the following components:      Result Value   Creatinine, Ser 1.31 (*)    GFR calc non Af Amer 53 (*)    All other components within normal limits  CBC WITH DIFFERENTIAL/PLATELET    EKG EKG Interpretation  Date/Time:  Thursday Jun 18 2017 21:55:40 EDT Ventricular Rate:  63 PR Interval:    QRS Duration: 101 QT Interval:  412 QTC Calculation: 422 R Axis:   78 Text Interpretation:  Sinus rhythm Atrial premature complexes ST-t wave abnormality Abnormal ekg Confirmed by Gerhard Munch [phone]) on 06/18/2017 10:04:47 PM   Radiology Ct Angio Chest Pe W And/or Wo Contrast  Result Date: 06/18/2017 CLINICAL DATA:  Shortness of breath, lower extremity edema, elevated D-dimer EXAM: CT ANGIOGRAPHY CHEST WITH CONTRAST TECHNIQUE: Multidetector CT imaging of the chest was performed using the standard protocol during bolus administration of intravenous contrast. Multiplanar CT image reconstructions and MIPs were obtained to evaluate the vascular anatomy. CONTRAST:  67mL ISOVUE-370 IOPAMIDOL (ISOVUE-370) INJECTION 76% COMPARISON:  Chest radiographs dated 12/08/2013 FINDINGS: Cardiovascular: Satisfactory opacification of the bilateral pulmonary arteries to the segmental level. No evidence of pulmonary embolism. No evidence of thoracic aortic aneurysm. Atherosclerotic  calcifications of the aortic arch. Cardiomegaly.  No pericardial effusion. Three vessel atherosclerosis. Postsurgical changes related to prior CABG. Mediastinum/Nodes: No suspicious mediastinal lymphadenopathy. Visualized thyroid is unremarkable. Lungs/Pleura: Mild linear scarring/atelectasis in the left upper lobe. Masslike rounded opacity at the left lung base (series 6/image 122), likely reflecting rounded atelectasis, although technically indeterminate. Mild dependent atelectasis in the right lower lobe. Mild  ground-glass opacity/mosaic attenuation in the lungs bilaterally. 3 mm subpleural nodule in the right upper lobe (series 6/image 45), likely benign. No pleural effusion or pneumothorax. Upper Abdomen: Visualized upper abdomen is grossly unremarkable. Musculoskeletal: Mild degenerative changes of the visualized thoracolumbar spine. Median sternotomy. Review of the MIP images confirms the above findings. IMPRESSION: No evidence of pulmonary embolism. Masslike rounded opacity in the left lung base, likely reflecting rounded atelectasis, although technically indeterminate. Consider follow-up CT chest in 3 months. 3 mm subpleural nodule in the right upper lobe, likely benign. No follow-up needed if patient is low-risk. Non-contrast chest CT can be considered in 12 months if patient is high-risk. This recommendation follows the consensus statement: Guidelines for Management of Incidental Pulmonary Nodules Detected on CT Images: From the Fleischner Society 2017; Radiology 2017; 284:228-243. Aortic Atherosclerosis (ICD10-I70.0). Electronically Signed   By: Charline Bills M.D.   On: 06/18/2017 23:06    Procedures Procedures (including critical care time)  Medications Ordered in ED Medications  iopamidol (ISOVUE-370) 76 % injection 100 mL (67 mLs Intravenous Contrast Given 06/18/17 2232)     Initial Impression / Assessment and Plan / ED Course  I have reviewed the triage vital signs and the nursing  notes.  Pertinent labs & imaging results that were available during my care of the patient were reviewed by me and considered in my medical decision making (see chart for details).    James [name] is a 72 y.o. male who presents to ED by recommendation of cardiology for elevated d-dimer obtained as an outpatient. Patient reports shortness of breath over the last several months. On exam, patient is afebrile, hemodynamically stable. EKG and labs reassuring. CT PE study obtained with no acute findings. Incidental nodule found - patient aware of findings - will follow as outpatient. Evaluation does not show pathology that would require ongoing emergent intervention or inpatient treatment. Discharged to home in stable condition with cardiology follow up. Return precautions discussed and all questions answered.   Patient seen by and discussed with Dr. Jeraldine Loots who agrees with treatment plan.     Final Clinical Impressions(s) / ED Diagnoses   Final diagnoses:  Positive D dimer    ED Discharge Orders    None       Fard Borunda, Chase Picket, PA-C 06/18/17 2340    Gerhard Munch, MD 06/19/17 2345

## 2017-06-18 NOTE — ED Notes (Signed)
ED Provider at bedside. 

## 2017-06-18 NOTE — ED Notes (Signed)
Patient transported to CT 

## 2017-06-18 NOTE — ED Notes (Signed)
EDP explained tests results and plan of care /discharge plan to pt.

## 2017-06-18 NOTE — ED Notes (Addendum)
N/A for vitals recheck

## 2017-06-18 NOTE — Telephone Encounter (Signed)
DDimer elevated at 1.08. I notified Dr. Mayford Knife of results. Per Dr. Mayford Knife have patient go to the ED for a chest CTA.   I spoke with patient and informed him of Dr. Norris Cross recommendation to go to the ER for a chest CTA. Patient stated he would go to the ED today. Patient verbalized understanding.  [facility] ED charge RN has been notified of patient's expected arrival

## 2017-06-18 NOTE — Discharge Instructions (Signed)
It was my pleasure taking care of you today!   Keep your appointments with the cardiologist.   Return to ER for new or worsening symptoms, any additional concerns.

## 2017-06-20 ENCOUNTER — Encounter (HOSPITAL_COMMUNITY): Payer: Self-pay | Admitting: Internal Medicine

## 2017-06-20 ENCOUNTER — Other Ambulatory Visit: Payer: Self-pay

## 2017-06-20 ENCOUNTER — Inpatient Hospital Stay (HOSPITAL_COMMUNITY)
Admit: 2017-06-20 | Discharge: 2017-06-20 | Disposition: A | Discharge status: Home / Self Care | Payer: Medicare HMO | Attending: Emergency Medicine | Admitting: Emergency Medicine

## 2017-06-20 ENCOUNTER — Emergency Department (HOSPITAL_COMMUNITY): Payer: Medicare HMO

## 2017-06-20 ENCOUNTER — Inpatient Hospital Stay (HOSPITAL_COMMUNITY)
Admission: EM | Admit: 2017-06-20 | Discharge: 2017-06-26 | DRG: 871 | Disposition: A | Discharge status: Home Health | Payer: Medicare HMO | Attending: Internal Medicine | Admitting: Internal Medicine

## 2017-06-20 DIAGNOSIS — L0390 Cellulitis, unspecified: Secondary | ICD-10-CM

## 2017-06-20 DIAGNOSIS — I2510 Atherosclerotic heart disease of native coronary artery without angina pectoris: Secondary | ICD-10-CM | POA: Diagnosis present

## 2017-06-20 DIAGNOSIS — I5033 Acute on chronic diastolic (congestive) heart failure: Secondary | ICD-10-CM | POA: Diagnosis present

## 2017-06-20 DIAGNOSIS — Z7982 Long term (current) use of aspirin: Secondary | ICD-10-CM

## 2017-06-20 DIAGNOSIS — E785 Hyperlipidemia, unspecified: Secondary | ICD-10-CM | POA: Diagnosis present

## 2017-06-20 DIAGNOSIS — J9811 Atelectasis: Secondary | ICD-10-CM | POA: Diagnosis present

## 2017-06-20 DIAGNOSIS — E1165 Type 2 diabetes mellitus with hyperglycemia: Secondary | ICD-10-CM | POA: Diagnosis not present

## 2017-06-20 DIAGNOSIS — E118 Type 2 diabetes mellitus with unspecified complications: Secondary | ICD-10-CM

## 2017-06-20 DIAGNOSIS — R6520 Severe sepsis without septic shock: Secondary | ICD-10-CM | POA: Diagnosis present

## 2017-06-20 DIAGNOSIS — E1122 Type 2 diabetes mellitus with diabetic chronic kidney disease: Secondary | ICD-10-CM | POA: Diagnosis present

## 2017-06-20 DIAGNOSIS — I10 Essential (primary) hypertension: Secondary | ICD-10-CM | POA: Diagnosis not present

## 2017-06-20 DIAGNOSIS — E1159 Type 2 diabetes mellitus with other circulatory complications: Secondary | ICD-10-CM | POA: Diagnosis present

## 2017-06-20 DIAGNOSIS — Z79899 Other long term (current) drug therapy: Secondary | ICD-10-CM

## 2017-06-20 DIAGNOSIS — I872 Venous insufficiency (chronic) (peripheral): Secondary | ICD-10-CM | POA: Diagnosis present

## 2017-06-20 DIAGNOSIS — G9341 Metabolic encephalopathy: Secondary | ICD-10-CM | POA: Diagnosis present

## 2017-06-20 DIAGNOSIS — R911 Solitary pulmonary nodule: Secondary | ICD-10-CM | POA: Diagnosis present

## 2017-06-20 DIAGNOSIS — R7881 Bacteremia: Secondary | ICD-10-CM | POA: Diagnosis not present

## 2017-06-20 DIAGNOSIS — Z6838 Body mass index (BMI) 38.0-38.9, adult: Secondary | ICD-10-CM

## 2017-06-20 DIAGNOSIS — N179 Acute kidney failure, unspecified: Secondary | ICD-10-CM | POA: Diagnosis present

## 2017-06-20 DIAGNOSIS — Z8249 Family history of ischemic heart disease and other diseases of the circulatory system: Secondary | ICD-10-CM | POA: Diagnosis not present

## 2017-06-20 DIAGNOSIS — I491 Atrial premature depolarization: Secondary | ICD-10-CM | POA: Diagnosis present

## 2017-06-20 DIAGNOSIS — E11649 Type 2 diabetes mellitus with hypoglycemia without coma: Secondary | ICD-10-CM | POA: Diagnosis not present

## 2017-06-20 DIAGNOSIS — A419 Sepsis, unspecified organism: Secondary | ICD-10-CM | POA: Diagnosis present

## 2017-06-20 DIAGNOSIS — Z823 Family history of stroke: Secondary | ICD-10-CM | POA: Diagnosis not present

## 2017-06-20 DIAGNOSIS — B954 Other streptococcus as the cause of diseases classified elsewhere: Secondary | ICD-10-CM | POA: Diagnosis not present

## 2017-06-20 DIAGNOSIS — I4891 Unspecified atrial fibrillation: Secondary | ICD-10-CM | POA: Diagnosis not present

## 2017-06-20 DIAGNOSIS — I130 Hypertensive heart and chronic kidney disease with heart failure and stage 1 through stage 4 chronic kidney disease, or unspecified chronic kidney disease: Secondary | ICD-10-CM | POA: Diagnosis present

## 2017-06-20 DIAGNOSIS — K219 Gastro-esophageal reflux disease without esophagitis: Secondary | ICD-10-CM | POA: Diagnosis present

## 2017-06-20 DIAGNOSIS — N183 Chronic kidney disease, stage 3 (moderate): Secondary | ICD-10-CM | POA: Diagnosis present

## 2017-06-20 DIAGNOSIS — Z888 Allergy status to other drugs, medicaments and biological substances status: Secondary | ICD-10-CM

## 2017-06-20 DIAGNOSIS — L03116 Cellulitis of left lower limb: Secondary | ICD-10-CM | POA: Diagnosis present

## 2017-06-20 DIAGNOSIS — I5032 Chronic diastolic (congestive) heart failure: Secondary | ICD-10-CM | POA: Diagnosis not present

## 2017-06-20 DIAGNOSIS — A400 Sepsis due to streptococcus, group A: Secondary | ICD-10-CM | POA: Diagnosis present

## 2017-06-20 DIAGNOSIS — Z8679 Personal history of other diseases of the circulatory system: Secondary | ICD-10-CM

## 2017-06-20 DIAGNOSIS — I129 Hypertensive chronic kidney disease with stage 1 through stage 4 chronic kidney disease, or unspecified chronic kidney disease: Secondary | ICD-10-CM | POA: Diagnosis not present

## 2017-06-20 DIAGNOSIS — B955 Unspecified streptococcus as the cause of diseases classified elsewhere: Secondary | ICD-10-CM

## 2017-06-20 DIAGNOSIS — Z951 Presence of aortocoronary bypass graft: Secondary | ICD-10-CM | POA: Diagnosis not present

## 2017-06-20 DIAGNOSIS — IMO0002 Reserved for concepts with insufficient information to code with codable children: Secondary | ICD-10-CM | POA: Diagnosis present

## 2017-06-20 DIAGNOSIS — Z794 Long term (current) use of insulin: Secondary | ICD-10-CM

## 2017-06-20 LAB — CREATININE, SERUM
Creatinine, Ser: 1.72 mg/dL — ABNORMAL HIGH (ref 0.61–1.24)
GFR calc non Af Amer: 38 mL/min — ABNORMAL LOW (ref >60)
GFR, EST AFRICAN AMERICAN: 44 mL/min — AB (ref >60)

## 2017-06-20 LAB — CBC
HEMATOCRIT: 34.6 % — AB (ref 39.0–52.0)
Hemoglobin: 11.3 g/dL — ABNORMAL LOW (ref 13.0–17.0)
MCH: 28.6 pg (ref 26.0–34.0)
MCHC: 32.7 g/dL (ref 30.0–36.0)
MCV: 87.6 fL (ref 78.0–100.0)
PLATELETS: 159 10*3/uL (ref 150–400)
RBC: 3.95 MIL/uL — ABNORMAL LOW (ref 4.22–5.81)
RDW: 13.4 % (ref 11.5–15.5)
WBC: 14.1 10*3/uL — ABNORMAL HIGH (ref 4.0–10.5)

## 2017-06-20 LAB — URINALYSIS, ROUTINE W REFLEX MICROSCOPIC
Glucose, UA: 50 mg/dL — AB
Ketones, ur: NEGATIVE mg/dL
Leukocytes, UA: NEGATIVE
Nitrite: NEGATIVE
PH: 5 (ref 5.0–8.0)
Protein, ur: >=300 mg/dL — AB
SPECIFIC GRAVITY, URINE: 1.031 — AB (ref 1.005–1.030)

## 2017-06-20 LAB — I-STAT CG4 LACTIC ACID, ED
LACTIC ACID, VENOUS: 3.71 mmol/L — AB (ref 0.5–1.9)
Lactic Acid, Venous: 6.55 mmol/L (ref 0.5–1.9)

## 2017-06-20 LAB — PROCALCITONIN: Procalcitonin: 14.7 ng/mL

## 2017-06-20 LAB — CBC WITH DIFFERENTIAL/PLATELET
Basophils Absolute: 0 10*3/uL (ref 0.0–0.1)
Basophils Relative: 0 %
Eosinophils Absolute: 0 10*3/uL (ref 0.0–0.7)
Eosinophils Relative: 0 %
HCT: 42.5 % (ref 39.0–52.0)
Hemoglobin: 14.4 g/dL (ref 13.0–17.0)
Lymphocytes Relative: 5 %
Lymphs Abs: 1 10*3/uL (ref 0.7–4.0)
MCH: 29.3 pg (ref 26.0–34.0)
MCHC: 33.9 g/dL (ref 30.0–36.0)
MCV: 86.6 fL (ref 78.0–100.0)
Monocytes Absolute: 0.4 10*3/uL (ref 0.1–1.0)
Monocytes Relative: 2 %
Neutro Abs: 17.2 10*3/uL — ABNORMAL HIGH (ref 1.7–7.7)
Neutrophils Relative %: 93 %
Platelets: 207 10*3/uL (ref 150–400)
RBC: 4.91 MIL/uL (ref 4.22–5.81)
RDW: 13.4 % (ref 11.5–15.5)
WBC: 18.7 10*3/uL — ABNORMAL HIGH (ref 4.0–10.5)

## 2017-06-20 LAB — COMPREHENSIVE METABOLIC PANEL
ALT: 17 U/L (ref 17–63)
AST: 36 U/L (ref 15–41)
Albumin: 3.7 g/dL (ref 3.5–5.0)
Alkaline Phosphatase: 85 U/L (ref 38–126)
Anion gap: 15 (ref 5–15)
BUN: 26 mg/dL — ABNORMAL HIGH (ref 6–20)
CO2: 22 mmol/L (ref 22–32)
Calcium: 9.4 mg/dL (ref 8.9–10.3)
Chloride: 98 mmol/L — ABNORMAL LOW (ref 101–111)
Creatinine, Ser: 2.11 mg/dL — ABNORMAL HIGH (ref 0.61–1.24)
GFR calc Af Amer: 34 mL/min — ABNORMAL LOW (ref >60)
GFR calc non Af Amer: 30 mL/min — ABNORMAL LOW (ref >60)
Glucose, Bld: 165 mg/dL — ABNORMAL HIGH (ref 65–99)
Potassium: 4.5 mmol/L (ref 3.5–5.1)
Sodium: 135 mmol/L (ref 135–145)
Total Bilirubin: 1.4 mg/dL — ABNORMAL HIGH (ref 0.3–1.2)
Total Protein: 7.1 g/dL (ref 6.5–8.1)

## 2017-06-20 LAB — CBG MONITORING, ED: Glucose-Capillary: 167 mg/dL — ABNORMAL HIGH (ref 65–99)

## 2017-06-20 LAB — GLUCOSE, CAPILLARY
Glucose-Capillary: 167 mg/dL — ABNORMAL HIGH (ref 65–99)
Glucose-Capillary: 93 mg/dL (ref 65–99)

## 2017-06-20 MED ORDER — OXYCODONE HCL 5 MG PO TABS
5.0000 mg | ORAL_TABLET | ORAL | Status: DC | PRN
  Administered 2017-06-20 – 2017-06-21 (×2): 5 mg via ORAL
  Filled 2017-06-20 (×2): qty 1

## 2017-06-20 MED ORDER — ONDANSETRON HCL 4 MG/2ML IJ SOLN: 4.0000 mg | Freq: Four times a day (QID) | INTRAMUSCULAR | Status: DC | PRN

## 2017-06-20 MED ORDER — PIPERACILLIN-TAZOBACTAM 3.375 G IVPB
3.3750 g | Freq: Three times a day (TID) | INTRAVENOUS | Status: DC
  Administered 2017-06-20 – 2017-06-21 (×2): 3.375 g via INTRAVENOUS
  Filled 2017-06-20 (×3): qty 50

## 2017-06-20 MED ORDER — SODIUM CHLORIDE 0.9 % IV BOLUS (SEPSIS)
1000.0000 mL | Freq: Once | INTRAVENOUS | Status: AC
  Administered 2017-06-20: 1000 mL via INTRAVENOUS

## 2017-06-20 MED ORDER — GLIMEPIRIDE 4 MG PO TABS
4.0000 mg | ORAL_TABLET | Freq: Every day | ORAL | Status: DC
  Filled 2017-06-20: qty 1

## 2017-06-20 MED ORDER — EZETIMIBE 10 MG PO TABS
10.0000 mg | ORAL_TABLET | Freq: Every day | ORAL | Status: DC
  Administered 2017-06-21 – 2017-06-26 (×6): 10 mg via ORAL
  Filled 2017-06-20 (×6): qty 1

## 2017-06-20 MED ORDER — METOPROLOL SUCCINATE ER 50 MG PO TB24
125.0000 mg | ORAL_TABLET | Freq: Every day | ORAL | Status: DC
  Administered 2017-06-21: 125 mg via ORAL
  Filled 2017-06-20: qty 3

## 2017-06-20 MED ORDER — ACETAMINOPHEN 325 MG PO TABS
650.0000 mg | ORAL_TABLET | Freq: Four times a day (QID) | ORAL | Status: DC | PRN
  Administered 2017-06-20 – 2017-06-21 (×3): 650 mg via ORAL
  Filled 2017-06-20 (×4): qty 2

## 2017-06-20 MED ORDER — DOCUSATE SODIUM 100 MG PO CAPS
100.0000 mg | ORAL_CAPSULE | Freq: Once | ORAL | Status: AC
  Administered 2017-06-20: 100 mg via ORAL
  Filled 2017-06-20: qty 1

## 2017-06-20 MED ORDER — PIPERACILLIN-TAZOBACTAM 3.375 G IVPB 30 MIN
3.3750 g | Freq: Once | INTRAVENOUS | Status: AC
  Administered 2017-06-20: 3.375 g via INTRAVENOUS
  Filled 2017-06-20: qty 50

## 2017-06-20 MED ORDER — ASPIRIN EC 81 MG PO TBEC
81.0000 mg | DELAYED_RELEASE_TABLET | Freq: Every day | ORAL | Status: DC
  Administered 2017-06-20 – 2017-06-26 (×7): 81 mg via ORAL
  Filled 2017-06-20 (×7): qty 1

## 2017-06-20 MED ORDER — PRAMIPEXOLE DIHYDROCHLORIDE 1.5 MG PO TABS
1.5000 mg | ORAL_TABLET | Freq: Every day | ORAL | Status: DC
  Filled 2017-06-20: qty 1

## 2017-06-20 MED ORDER — ONDANSETRON HCL 4 MG PO TABS: 4.0000 mg | ORAL_TABLET | Freq: Four times a day (QID) | ORAL | Status: DC | PRN

## 2017-06-20 MED ORDER — PRAVASTATIN SODIUM 40 MG PO TABS
40.0000 mg | ORAL_TABLET | Freq: Every day | ORAL | Status: DC
  Administered 2017-06-21 – 2017-06-26 (×6): 40 mg via ORAL
  Filled 2017-06-20 (×6): qty 1

## 2017-06-20 MED ORDER — SODIUM CHLORIDE 0.9 % IV BOLUS
1000.0000 mL | Freq: Once | INTRAVENOUS | Status: AC
  Administered 2017-06-20: 1000 mL via INTRAVENOUS

## 2017-06-20 MED ORDER — VITAMIN D 1000 UNITS PO TABS
2000.0000 [IU] | ORAL_TABLET | Freq: Every day | ORAL | Status: DC
  Administered 2017-06-21 – 2017-06-26 (×6): 2000 [IU] via ORAL
  Filled 2017-06-20 (×6): qty 2

## 2017-06-20 MED ORDER — PRAMIPEXOLE DIHYDROCHLORIDE 1.5 MG PO TABS
1.5000 mg | ORAL_TABLET | Freq: Two times a day (BID) | ORAL | Status: DC
  Administered 2017-06-20 – 2017-06-26 (×12): 1.5 mg via ORAL
  Filled 2017-06-20 (×12): qty 1

## 2017-06-20 MED ORDER — ACETAMINOPHEN 650 MG RE SUPP: 650.0000 mg | Freq: Four times a day (QID) | RECTAL | Status: DC | PRN

## 2017-06-20 MED ORDER — INSULIN ASPART PROT & ASPART (70-30 MIX) 100 UNIT/ML ~~LOC~~ SUSP
35.0000 [IU] | Freq: Two times a day (BID) | SUBCUTANEOUS | Status: DC
  Administered 2017-06-20 – 2017-06-22 (×4): 35 [IU] via SUBCUTANEOUS
  Filled 2017-06-20: qty 10

## 2017-06-20 MED ORDER — ENOXAPARIN SODIUM 60 MG/0.6ML ~~LOC~~ SOLN
60.0000 mg | SUBCUTANEOUS | Status: DC
  Administered 2017-06-20 – 2017-06-25 (×6): 60 mg via SUBCUTANEOUS
  Filled 2017-06-20 (×6): qty 0.6

## 2017-06-20 MED ORDER — IRBESARTAN 300 MG PO TABS: 300.0000 mg | ORAL_TABLET | Freq: Every day | ORAL | Status: DC

## 2017-06-20 MED ORDER — MAGNESIUM CITRATE PO SOLN: 1.0000 | Freq: Once | ORAL | Status: DC | PRN

## 2017-06-20 MED ORDER — METOPROLOL SUCCINATE ER 50 MG PO TB24: 75.0000 mg | ORAL_TABLET | Freq: Every day | ORAL | Status: DC

## 2017-06-20 MED ORDER — VANCOMYCIN HCL IN DEXTROSE 1-5 GM/200ML-% IV SOLN
1000.0000 mg | Freq: Once | INTRAVENOUS | Status: AC
  Administered 2017-06-20: 1000 mg via INTRAVENOUS
  Filled 2017-06-20: qty 200

## 2017-06-20 MED ORDER — OMEGA-3-ACID ETHYL ESTERS 1 G PO CAPS
2.0000 g | ORAL_CAPSULE | Freq: Two times a day (BID) | ORAL | Status: DC
  Administered 2017-06-20 – 2017-06-26 (×12): 2 g via ORAL
  Filled 2017-06-20 (×14): qty 2

## 2017-06-20 MED ORDER — SODIUM CHLORIDE 0.9% FLUSH
3.0000 mL | Freq: Two times a day (BID) | INTRAVENOUS | Status: DC
  Administered 2017-06-20 – 2017-06-25 (×8): 3 mL via INTRAVENOUS

## 2017-06-20 MED ORDER — INSULIN ASPART 100 UNIT/ML ~~LOC~~ SOLN
0.0000 [IU] | Freq: Three times a day (TID) | SUBCUTANEOUS | Status: DC
  Administered 2017-06-20: 4 [IU] via SUBCUTANEOUS
  Administered 2017-06-21: 3 [IU] via SUBCUTANEOUS
  Administered 2017-06-23 – 2017-06-24 (×3): 4 [IU] via SUBCUTANEOUS
  Administered 2017-06-24: 3 [IU] via SUBCUTANEOUS
  Administered 2017-06-25: 4 [IU] via SUBCUTANEOUS
  Administered 2017-06-25: 7 [IU] via SUBCUTANEOUS
  Administered 2017-06-26: 3 [IU] via SUBCUTANEOUS

## 2017-06-20 MED ORDER — SPIRONOLACTONE 25 MG PO TABS: 25.0000 mg | ORAL_TABLET | Freq: Every day | ORAL | Status: DC

## 2017-06-20 MED ORDER — INSULIN ASPART 100 UNIT/ML ~~LOC~~ SOLN
4.0000 [IU] | Freq: Three times a day (TID) | SUBCUTANEOUS | Status: DC
  Administered 2017-06-20 – 2017-06-22 (×4): 4 [IU] via SUBCUTANEOUS

## 2017-06-20 MED ORDER — ADULT MULTIVITAMIN W/MINERALS CH
1.0000 | ORAL_TABLET | Freq: Every day | ORAL | Status: DC
  Administered 2017-06-21 – 2017-06-26 (×6): 1 via ORAL
  Filled 2017-06-20 (×6): qty 1

## 2017-06-20 MED ORDER — POTASSIUM CHLORIDE CRYS ER 10 MEQ PO TBCR
10.0000 meq | EXTENDED_RELEASE_TABLET | Freq: Every day | ORAL | Status: DC
  Administered 2017-06-21 – 2017-06-22 (×2): 10 meq via ORAL
  Filled 2017-06-20 (×3): qty 1

## 2017-06-20 MED ORDER — ACETAMINOPHEN 325 MG PO TABS
650.0000 mg | ORAL_TABLET | Freq: Once | ORAL | Status: AC
  Administered 2017-06-20: 650 mg via ORAL
  Filled 2017-06-20: qty 2

## 2017-06-20 MED ORDER — INSULIN ASPART 100 UNIT/ML ~~LOC~~ SOLN
0.0000 [IU] | Freq: Every day | SUBCUTANEOUS | Status: DC
  Administered 2017-06-23: 2 [IU] via SUBCUTANEOUS

## 2017-06-20 MED ORDER — MORPHINE SULFATE (PF) 4 MG/ML IV SOLN
4.0000 mg | Freq: Once | INTRAVENOUS | Status: AC
  Administered 2017-06-20: 4 mg via INTRAVENOUS
  Filled 2017-06-20: qty 1

## 2017-06-20 MED ORDER — VANCOMYCIN HCL IN DEXTROSE 1-5 GM/200ML-% IV SOLN
1000.0000 mg | Freq: Two times a day (BID) | INTRAVENOUS | Status: DC
  Administered 2017-06-21: 1000 mg via INTRAVENOUS
  Filled 2017-06-20 (×2): qty 200

## 2017-06-20 NOTE — ED Triage Notes (Signed)
Pt BIB EMS for further eval of abd distention, fever and cellulitis of LLE with open wound noted; tachyneic and tachycardic at this time with oral temp of 102.1

## 2017-06-20 NOTE — H&P (Signed)
History and Physical    James [name] ZOX:[id number] DOB: [birth year] DOA: 06/20/2017  PCP: Joycelyn Rua, MD   Patient coming from: Home via EMS   I have personally briefly reviewed patient's old medical records in [facility]  Chief Complaint: Further evaluation of fever shaking chills erythema of left lower extremity and abdominal distention.  On arrival patient did not know why he was here.  HPI: James [name] is a 72 y.o. male with medical history significant Beatties, hypertension, hyperlipidemia, related aortic root, diastolic dysfunction, gastroesophageal reflux disease who presented to the ER this morning after his 23-year-old grandson noted he was not acting right.  He called his mother to tell her that the patient was shaking and grunting a little bit.  Was unable to answer the phone and he reached his grandmother who instructed him to call 911.  He was nervous to do so so the patient's son-in-law came to the house and 911 was activated.  Really the patient did not want to come to the hospital but as he got more sick he was unable to say no.  Rival to the emergency department he was noted to be febrile to 102.7.  Patient did not know that he was febrile.  All he would say was that his daughter made me come in to get me checked out.  He denies acute change in his shortness of breath states that it is the same as usual.  Had a worsening cough but is unable to tell what color his sputum is.  His abdomen has been distended for the past 2 to 3 weeks.  He has chronic edema of the bilateral lower extremities but his daughter states that his left leg is significantly worse than it was yesterday.  Patient denies nausea vomiting, urinary symptoms including frequency and urgency, he was evaluated in the emergency department 2 days ago having been sent in by his cardiologist due to concerns of pulmonary embolism.  His CTA of the chest was negative.  Lower extremity Doppler was not obtained.  Most of  the history is obtained from the patient's daughter who states that he is somewhat confused and has some low-level dementia.   ED Course: Found to be febrile to 102.1.  Tachypneic and tachycardic consistent with sepsis.  Lactic acid was 6.5.  He showed acute kidney injury with a creatinine of 2.11 up from 1.2 previously.  Compared to 2 days ago blood pressure is relatively hypotensive.  Previously his blood pressure was 175/96 and today is 102/64.  He received 3 L of normal saline fluid resuscitation and Zosyn and vancomycin in the emergency department.  Review of Systems: As per HPI otherwise all other systems reviewed and  negative.   Past Medical History:  Diagnosis Date  . Ascending aortic aneurysm (HCC)    cMRI (2/15): Normal LV EF 54%, Mild BAE, Trileaflet Aortic Valve, Upper limits of normal ascending aorta 3.8 cm, Normal aortic arch 2.3 cm with bovine origin of left carotid  . Atrial flutter (HCC)    Post-op with no reoccurence  . Chronic diastolic heart failure (HCC)    Echo (1/15): Left ventricle: The cavity size was mildly dilated. Mild LVH. EF 55% to 60%. Wall motion was normal; Gr 1 diastolic dysfunction Left atrium: The atrium was mildly dilated. Right atrium: The atrium was mildly to moderately dilated. Aortic arch measures 4.8 cm (moderately dilated); suggest CTA or MRA to better assess.  . Coronary artery disease 10/2011   a.  s/p cabg;  b. Myoview (9/15):  ant ischemia, poss TID, EF 43%, high risk >> LHC (9/15): Dist LM 50, pLAD 90 then 100, pCFX 50, mCFX 75, oRCA 75, dRCA 95, L-LAD patent, S-Dx 100, S-OM1 patent, S-PDA patent, EF 55% >> native Dx small caliber and not well suited for PCI; native RCA amenable to PCI and would restore flow to PLA branches but would jeopardize SVG-RCA - Med Rx  . Deviated nasal septum   . Diabetes mellitus    onset 2013  . Dyslipidemia   . GERD (gastroesophageal reflux disease)   . Hx of Doppler ultrasound    Carotid US (9/13): No ICA  stenosis  . Hypertension     Past Surgical History:  Procedure Laterality Date  . CARDIAC CATHETERIZATION    . CORONARY ARTERY BYPASS GRAFT  10/27/2011   Procedure: CORONARY ARTERY BYPASS GRAFTING (CABG);  Surgeon: Kerin Perna, MD;  Location: [facility] OR;  Service: Open Heart Surgery;  Laterality: N/A;  Coronary Artery Bypass Grafting times four using left internal mammary artery and right greater saphenous vein endoscopically harvested  . LEFT HEART CATHETERIZATION WITH CORONARY ANGIOGRAM N/A 10/23/2011   Procedure: LEFT HEART CATHETERIZATION WITH CORONARY ANGIOGRAM;  Surgeon: Quintella Reichert, MD;  Location: [facility] CATH LAB;  Service: Cardiovascular;  Laterality: N/A;  . LEFT HEART CATHETERIZATION WITH CORONARY ANGIOGRAM N/A 11/16/2013   Procedure: LEFT HEART CATHETERIZATION WITH CORONARY ANGIOGRAM;  Surgeon: Micheline Chapman, MD;  Location: [facility] CATH LAB;  Service: Cardiovascular;  Laterality: N/A;  . left knee arthroscopy  2003  . right knee arthroscopy  2006    Social History   Social History Narrative  . Not on file     reports that he has never smoked. He has never used smokeless tobacco. He reports that he drinks alcohol. He reports that he does not use drugs.  Allergies  Allergen Reactions  . Lisinopril Cough    Family History  Problem Relation Age of Onset  . Hypertension Mother   . Stroke Father   . Hypertension Father   . Hypertension Paternal Grandfather   . Heart attack Neg Hx     Prior to Admission medications   Medication Sig Start Date End Date Taking? Authorizing Provider  amLODipine (NORVASC) 10 MG tablet Take 1 tablet (10 mg total) by mouth daily. 11/28/14  Yes Quintella Reichert, MD  aspirin 81 MG tablet Take 81 mg by mouth daily.   Yes [provider]  cholecalciferol (VITAMIN D) 1000 units tablet Take 2,000 Units by mouth daily.   Yes [provider]  ezetimibe (ZETIA) 10 MG tablet Take 1 tablet (10 mg total) by mouth daily. 11/13/15  Yes Turner,  Cornelious Bryant, MD  fish oil-omega-3 fatty acids 1000 MG capsule Take 2 g by mouth 2 (two) times daily.    Yes [provider]  furosemide (LASIX) 20 MG tablet TAKE 1 TABLET (20 MG TOTAL) BY MOUTH DAILY. 02/20/17  Yes Turner, Traci R, MD  glimepiride (AMARYL) 4 MG tablet Take 4 mg by mouth daily. 06/05/17  Yes [provider]  HUMALOG MIX 75/25 KWIKPEN (75-25) 100 UNIT/ML Kwikpen Inject 35 mLs as directed 2 (two) times daily.  03/07/14  Yes [provider]  isosorbide mononitrate (IMDUR) 60 MG 24 hr tablet TAKE 1 TABLET (60 MG TOTAL) BY MOUTH DAILY. 02/20/17  Yes Turner, Cornelious Bryant, MD  lisinopril (PRINIVIL,ZESTRIL) 10 MG tablet Take 10 mg by mouth daily.   Yes [provider]  metFORMIN (  GLUCOPHAGE) 500 MG tablet Take 1,000 mg by mouth 2 (two) times daily with a meal.   Yes [provider]  metoprolol succinate (TOPROL-XL) 100 MG 24 hr tablet TAKE 1 TABLET BY MOUTH DAILY TAKE WITH OR IMMEDIATELY FOLLOWING A MEAL 01/28/17  Yes Turner, Traci R, MD  metoprolol succinate (TOPROL-XL) 25 MG 24 hr tablet TAKE 1 TABLET (25 MG TOTAL) BY MOUTH DAILY. TAKE WITH OR IMMEDIATELY FOLLOWING A MEAL. Patient taking differently: TAKE 3 TABLETS (125 MG TOTAL) BY MOUTH DAILY. TAKE WITH OR IMMEDIATELY FOLLOWING A MEAL. 01/28/17  Yes Turner, Cornelious Bryant, MD  Multiple Vitamin (MULTIVITAMIN WITH MINERALS) TABS Take 1 tablet by mouth daily.   Yes [provider]  olmesartan (BENICAR) 40 MG tablet Take 1 tablet (40 mg total) by mouth daily. 11/28/14  Yes Turner, Cornelious Bryant, MD  potassium chloride (K-DUR) 10 MEQ tablet Take 1 tablet (10 mEq total) by mouth daily. 12/09/13  Yes Weaver, Scott T, PA-C  pramipexole (MIRAPEX) 0.75 MG tablet Take 1.5 mg by mouth at bedtime.    Yes [provider]  pravastatin (PRAVACHOL) 40 MG tablet Take 40 mg by mouth daily.   Yes [provider]  spironolactone (ALDACTONE) 25 MG tablet Take 1 tablet (25 mg total) by mouth daily. 11/28/14  Yes  Quintella Reichert, MD    Physical Exam:  Constitutional: NAD, calm, comfortable Vitals:   06/20/17 1230 06/20/17 1245 06/20/17 1300 06/20/17 1315  BP: 122/76 122/65 124/71 138/70  Pulse: (!) 112 (!) 107 (!) 107 (!) 108  Resp: 20 (!) 31 (!) 26 (!) 32  Temp:      TempSrc:      SpO2: 93% 95% 98% 99%  Weight:      Height:       Eyes: PERRL, lids and conjunctivae normal ENMT: Mucous membranes are moist. Posterior pharynx clear of any exudate or lesions.Normal dentition.  Neck: normal, supple, no masses, no thyromegaly Respiratory: Increased respiratory effort with bilateral coarse breath sounds no wheezing no crackles decreased breath sounds at the bases bilaterally.  No dullness to percussion. Cardiovascular: Regular rate and rhythm, no murmurs / rubs / gallops. No extremity edema. 2+ pedal pulses. No carotid bruits.  Abdomen: no tenderness, no masses palpated. No hepatosplenomegaly. Bowel sounds positive.  Musculoskeletal: no clubbing / cyanosis. No joint deformity upper and lower extremities. Good ROM, no contractures. Normal muscle tone.  Skin: Bilateral venous stasis lesions, 1 cm ulceration at the anterior shin of the left leg. No induration; eft lower extremity significantly more erythematous than the right lower extremity with areas of lymphangitis. Neurologic: CN 2-12 grossly intact. Sensation intact, DTR normal. Strength 5/5 in all 4.  Psychiatric: Normal judgment and insight. Alert and oriented x 3. Normal mood.    Labs on Admission: I have personally reviewed following labs and imaging studies  CBC: Recent Labs  Lab 06/18/17 1001 06/18/17 2151 06/20/17 1208  WBC 11.3* 10.2 18.7*  NEUTROABS  --  7.0 17.2*  HGB 14.5 14.5 14.4  HCT 42.0 42.6 42.5  MCV 85 86.6 86.6  PLT 252 233 207   Basic Metabolic Panel: Recent Labs  Lab 06/18/17 1001 06/18/17 2151 06/20/17 1208  NA 140 141 135  K 4.3 3.7 4.5  CL 101 102 98*  CO2 GLUCOSE 68 94 165*  BUN 14 14 26*    CREATININE 1.25 1.31* 2.11*  CALCIUM 9.4 9.3 9.4   GFR: Estimated Creatinine Clearance: 43.8 mL/min (A) (by C-G formula based  on SCr of 2.11 mg/dL (H)). Liver Function Tests: Recent Labs  Lab 06/20/17 1208  AST 36  ALT 17  ALKPHOS 85  BILITOT 1.4*  PROT 7.1  ALBUMIN 3.7    BNP (last 3 results) Recent Labs    06/18/17 1001  PROBNP 274    Recent Labs    06/18/17 1001  TSH 3.340   Urine analysis:    Component Value Date/Time   COLORURINE YELLOW 10/24/2011 1742   APPEARANCEUR CLEAR 10/24/2011 1742   LABSPEC 1.010 10/24/2011 1742   PHURINE 7.0 10/24/2011 1742   GLUCOSEU NEGATIVE 10/24/2011 1742   HGBUR NEGATIVE 10/24/2011 1742   BILIRUBINUR NEGATIVE 10/24/2011 1742   KETONESUR NEGATIVE 10/24/2011 1742   PROTEINUR NEGATIVE 10/24/2011 1742   UROBILINOGEN 1.0 10/24/2011 1742   NITRITE NEGATIVE 10/24/2011 1742   LEUKOCYTESUR NEGATIVE 10/24/2011 1742    Radiological Exams on Admission: Ct Angio Chest Pe W And/or Wo Contrast  Result Date: 06/18/2017 CLINICAL DATA:  Shortness of breath, lower extremity edema, elevated D-dimer EXAM: CT ANGIOGRAPHY CHEST WITH CONTRAST TECHNIQUE: Multidetector CT imaging of the chest was performed using the standard protocol during bolus administration of intravenous contrast. Multiplanar CT image reconstructions and MIPs were obtained to evaluate the vascular anatomy. CONTRAST:  67mL ISOVUE-370 IOPAMIDOL (ISOVUE-370) INJECTION 76% COMPARISON:  Chest radiographs dated 12/08/2013 FINDINGS: Cardiovascular: Satisfactory opacification of the bilateral pulmonary arteries to the segmental level. No evidence of pulmonary embolism. No evidence of thoracic aortic aneurysm. Atherosclerotic calcifications of the aortic arch. Cardiomegaly.  No pericardial effusion. Three vessel atherosclerosis. Postsurgical changes related to prior CABG. Mediastinum/Nodes: No suspicious mediastinal lymphadenopathy. Visualized thyroid is unremarkable. Lungs/Pleura: Mild linear  scarring/atelectasis in the left upper lobe. Masslike rounded opacity at the left lung base (series 6/image 122), likely reflecting rounded atelectasis, although technically indeterminate. Mild dependent atelectasis in the right lower lobe. Mild ground-glass opacity/mosaic attenuation in the lungs bilaterally. 3 mm subpleural nodule in the right upper lobe (series 6/image 45), likely benign. No pleural effusion or pneumothorax. Upper Abdomen: Visualized upper abdomen is grossly unremarkable. Musculoskeletal: Mild degenerative changes of the visualized thoracolumbar spine. Median sternotomy. Review of the MIP images confirms the above findings. IMPRESSION: No evidence of pulmonary embolism. Masslike rounded opacity in the left lung base, likely reflecting rounded atelectasis, although technically indeterminate. Consider follow-up CT chest in 3 months. 3 mm subpleural nodule in the right upper lobe, likely benign. No follow-up needed if patient is low-risk. Non-contrast chest CT can be considered in 12 months if patient is high-risk. This recommendation follows the consensus statement: Guidelines for Management of Incidental Pulmonary Nodules Detected on CT Images: From the Fleischner Society 2017; Radiology 2017; 284:228-243. Aortic Atherosclerosis (ICD10-I70.0). Electronically Signed   By: Charline Bills M.D.   On: 06/18/2017 23:06   Dg Abdomen Acute W/chest  Result Date: 06/20/2017 CLINICAL DATA:  72 year old male with a history of abdominal distention and shortness of breath EXAM: DG ABDOMEN ACUTE W/ 1V CHEST COMPARISON:  CT 06/18/2017, no prior abdominal CT FINDINGS: Chest: Cardiomediastinal silhouette persistently enlarged. Surgical changes of median sternotomy and CABG. Low lung volumes with coarsened interstitial markings. Abdomen: Gas within stomach, small bowel, colon. No abnormal distention. Large stool burden. Gas extends to the rectum. No radiopaque foreign body. No unexpected calcification or soft  tissue density. IMPRESSION: Chest: Low lung volumes with no evidence of lobar pneumonia. Surgical changes of median sternotomy and CABG. Abdomen: Nonobstructive bowel gas pattern. Large stool burden, potentially representing constipation. Electronically Signed   By: Gilmer Mor D.O.  On: 06/20/2017 12:41    EKG: Independently reviewed shows sinus rhythm with PACs and ST-T wave abnormalities when compared with 06/18/2017 there is no significant change  Assessment/Plan Active Problems:   Sepsis (HCC)   Cellulitis of left lower extremity   Coronary artery disease   DM (diabetes mellitus), type 2, uncontrolled with complications (HCC)   Chronic diastolic heart failure (HCC)   Hypertension   Hyperlipidemia   GERD (gastroesophageal reflux disease)   1.  Sepsis: Patient meets criteria for sepsis based on fever, tachypnea, tachycardia, white blood cell count, and elevated lactate.  We will place him on the sepsis protocol and give him 30 mL/kg of IV fluids start IV antibiotics, check serial lactic acids.  A procalcitonin has also been ordered.  We will follow vital signs very closely.  2.  Cellulitis of left lower extremity this is likely cause of patient's sepsis it is significantly more red and there is lymphangitis on the left leg that is not present on the right lower extremity.  He has been started on vancomycin and Zosyn.  3.  Coronary artery disease: Continue home medication regimen.  Which includes aspirin, Zetia, fish oil has been held.  M. Doerr, is currently on hold due to low blood pressures metoprolol, and lisinopril.  4.  Type 2 diabetes mellitus with controlled blood glucoses and vascular complications: We will follow basal blood glucose protocol.  Stick blood glucoses before meals and at bedtime.  Number blood sugars closely.  Insulin has been reordered however his form and is on hold.  5.  Chronic diastolic congestive heart failure: Currently receiving aspirin, Zetia, fish oil,  furosemide, spironolactone, Imdur, lisinopril, metoprolol, home losartan, and pravastatin.  Furosemide Imdur were lisinopril have been held.  We will restart as soon as patient's blood pressure response.  6.  Hypertension: Pressure medications as above.  Restart other medications as able.  7.  Hyperlipidemia continue pravastatin.  8.  Gastroesophageal reflux disease noted.  DVT prophylaxis: Lovenox Code Status: Full code Family Communication: Spoke with patient's daughter who was present on admission. Disposition Plan: Home versus skilled nursing facility in 4 to 5 days. Consults called: None Admission status: Inpatient   Lahoma Crocker MD FACP [facility] Pager [phone]  If 7PM-7AM, please contact night-coverage [email or web address] Password [email or web address]  06/20/2017, 2:56 PM

## 2017-06-20 NOTE — Progress Notes (Signed)
ANTIBIOTIC CONSULT NOTE - INITIAL  Pharmacy Consult for Vanco/Zosyn Indication: Cellulits, sepsis  Allergies  Allergen Reactions  . Lisinopril Cough    Patient Measurements: Height: 6' (182.9 cm) Weight: 283 lb (128.4 kg) IBW/kg (Calculated) : 77.6 Adjusted Body Weight:    Vital Signs: Temp: 102.1 F (38.9 C) (05/04 1145) Temp Source: Oral (05/04 1145) BP: 112/65 (05/04 1145) Pulse Rate: 118 (05/04 1145) Intake/Output from previous day: No intake/output data recorded. Intake/Output from this shift: No intake/output data recorded.  Labs: Recent Labs    06/18/17 1001 06/18/17 2151  WBC 11.3* 10.2  HGB 14.5 14.5  PLT 252 233  CREATININE 1.25 1.31*   Estimated Creatinine Clearance: 70.6 mL/min (A) (by C-G formula based on SCr of 1.31 mg/dL (H)). No results for input(s): VANCOTROUGH, VANCOPEAK, VANCORANDOM, GENTTROUGH, GENTPEAK, GENTRANDOM, TOBRATROUGH, TOBRAPEAK, TOBRARND, AMIKACINPEAK, AMIKACINTROU, AMIKACIN in the last 72 hours.   Microbiology: No results found for this or any previous visit (from the past 720 hour(s)).  Medical History: Past Medical History:  Diagnosis Date  . Ascending aortic aneurysm (HCC)    cMRI (2/15): Normal LV EF 54%, Mild BAE, Trileaflet Aortic Valve, Upper limits of normal ascending aorta 3.8 cm, Normal aortic arch 2.3 cm with bovine origin of left carotid  . Atrial flutter (HCC)    Post-op with no reoccurence  . Chronic diastolic heart failure (HCC)    Echo (1/15): Left ventricle: The cavity size was mildly dilated. Mild LVH. EF 55% to 60%. Wall motion was normal; Gr 1 diastolic dysfunction Left atrium: The atrium was mildly dilated. Right atrium: The atrium was mildly to moderately dilated. Aortic arch measures 4.8 cm (moderately dilated); suggest CTA or MRA to better assess.  . Coronary artery disease 10/2011   a. s/p cabg;  b. Myoview (9/15):  ant ischemia, poss TID, EF 43%, high risk >> LHC (9/15): Dist LM 50, pLAD 90 then 100,  pCFX 50, mCFX 75, oRCA 75, dRCA 95, L-LAD patent, S-Dx 100, S-OM1 patent, S-PDA patent, EF 55% >> native Dx small caliber and not well suited for PCI; native RCA amenable to PCI and would restore flow to PLA branches but would jeopardize SVG-RCA - Med Rx  . Deviated nasal septum   . Diabetes mellitus    onset 2013  . Dyslipidemia   . GERD (gastroesophageal reflux disease)   . Hx of Doppler ultrasound    Carotid US (9/13): No ICA stenosis  . Hypertension    Assessment:  CC/HPI: abd distention, fever and cellulitis of LLE with open wound. Start abx for cellulitis, code sepsis.  - 102.1, HR 118, BP WNL, RR 20  PMH: CAD, HTN, HLD, CHF, chronic LE edema, native 3VCAD, CABG, AAA, aflutter, DM, GERD,   Significant events: ED visit 5/2 from MD office to r/o PE for Ddimer 1.08. CT was negative for PE  Goal of Therapy:  Vancomycin trough level 15-20 mcg/ml  Plan:  Vancomycin 1 g IV q 12hrs. Trough after 3-5 doses at steady state. Zosyn 3.375g IV q 8hrs   Fumie Fiallo S. Merilynn Finland, PharmD, BCPS Clinical Staff Pharmacist Pager [phone]  Misty Stanley Stillinger 06/20/2017,12:11 PM

## 2017-06-20 NOTE — ED Notes (Signed)
Date and time results received: 06/20/17 n (use smartphrase ".now" to insert current time)  Test: Lactic Acid  Critical Value: 6.55  Name of Provider Notified: Steinl  Orders Received? Or Actions Taken?: no orders given

## 2017-06-20 NOTE — Progress Notes (Signed)
*  Preliminary Results* Left lower extremity venous duplex completed. Left lower extremity is negative for deep vein thrombosis. There is no evidence of left Baker's cyst.  06/20/2017 3:51 PM  Gertie Fey, BS, RVT, RDCS, RDMS

## 2017-06-20 NOTE — ED Provider Notes (Signed)
[facility] EMERGENCY DEPARTMENT Provider Note   CSN: [id number] Arrival date & time: 06/20/17  1128     History   Chief Complaint Chief Complaint  Patient presents with  . Fever    HPI James [name] is a 72 y.o. male with history of a sending aortic aneurysm, atrial flutter, chronic diastolic heart failure, CAD, DM, HLD, HTN, and GERD presents for evaluation of fever.  The patient was unaware that he was febrile but he states "my daughter made me come in to get checked out ".  He does not endorse shortness of breath but states it is chronic and unchanged for him.  He does note a worsening cough but is unable to tell me what color his sputum is.  He denies chest pain or abdominal pain.  He states his abdomen has been distended for the past 2 to 3 weeks.  He notes chronic edema of the bilateral lower extremities and states that his left is usually worse than his right.  He denies nausea, vomiting, urinary symptoms.  Has not tried anything for his symptoms.  He was seen and evaluated 2 days ago in the ED sent over by his cardiologist for PE rule out and had a temperature of 99.7 F at that time.  He was negative for DVT and was found to be stable for discharge home at the time.  The history is provided by the patient.    Past Medical History:  Diagnosis Date  . Ascending aortic aneurysm (HCC)    cMRI (2/15): Normal LV EF 54%, Mild BAE, Trileaflet Aortic Valve, Upper limits of normal ascending aorta 3.8 cm, Normal aortic arch 2.3 cm with bovine origin of left carotid  . Atrial flutter (HCC)    Post-op with no reoccurence  . Chronic diastolic heart failure (HCC)    Echo (1/15): Left ventricle: The cavity size was mildly dilated. Mild LVH. EF 55% to 60%. Wall motion was normal; Gr 1 diastolic dysfunction Left atrium: The atrium was mildly dilated. Right atrium: The atrium was mildly to moderately dilated. Aortic arch measures 4.8 cm (moderately dilated); suggest CTA or  MRA to better assess.  . Coronary artery disease 10/2011   a. s/p cabg;  b. Myoview (9/15):  ant ischemia, poss TID, EF 43%, high risk >> LHC (9/15): Dist LM 50, pLAD 90 then 100, pCFX 50, mCFX 75, oRCA 75, dRCA 95, L-LAD patent, S-Dx 100, S-OM1 patent, S-PDA patent, EF 55% >> native Dx small caliber and not well suited for PCI; native RCA amenable to PCI and would restore flow to PLA branches but would jeopardize SVG-RCA - Med Rx  . Deviated nasal septum   . Diabetes mellitus    onset 2013  . Dyslipidemia   . GERD (gastroesophageal reflux disease)   . Hx of Doppler ultrasound    Carotid US (9/13): No ICA stenosis  . Hypertension     Patient Active Problem List   Diagnosis Date Noted  . Sepsis (HCC) 06/20/2017  . Cellulitis of left lower extremity 06/20/2017  . SOB (shortness of breath) 06/18/2017  . Abnormal nuclear cardiac imaging test 11/14/2013  . Abnormal EKG 03/09/2013  . Chronic diastolic heart failure (HCC) 03/09/2013  . Dilated aortic root (HCC)   . Diastolic dysfunction   . Edema 02/09/2013  . Encounter for long-term (current) use of other medications 02/09/2013  . Hypertension 10/24/2011  . Coronary artery disease 10/24/2011  . Hyperlipidemia 10/24/2011  . DM (diabetes mellitus), type 2,  uncontrolled with complications (HCC) 10/24/2011  . GERD (gastroesophageal reflux disease) 10/24/2011    Past Surgical History:  Procedure Laterality Date  . CARDIAC CATHETERIZATION    . CORONARY ARTERY BYPASS GRAFT  10/27/2011   Procedure: CORONARY ARTERY BYPASS GRAFTING (CABG);  Surgeon: Kerin Perna, MD;  Location: [facility] OR;  Service: Open Heart Surgery;  Laterality: N/A;  Coronary Artery Bypass Grafting times four using left internal mammary artery and right greater saphenous vein endoscopically harvested  . LEFT HEART CATHETERIZATION WITH CORONARY ANGIOGRAM N/A 10/23/2011   Procedure: LEFT HEART CATHETERIZATION WITH CORONARY ANGIOGRAM;  Surgeon: Quintella Reichert, MD;  Location: [facility]  CATH LAB;  Service: Cardiovascular;  Laterality: N/A;  . LEFT HEART CATHETERIZATION WITH CORONARY ANGIOGRAM N/A 11/16/2013   Procedure: LEFT HEART CATHETERIZATION WITH CORONARY ANGIOGRAM;  Surgeon: Micheline Chapman, MD;  Location: [facility] CATH LAB;  Service: Cardiovascular;  Laterality: N/A;  . left knee arthroscopy  2003  . right knee arthroscopy  2006        Home Medications    Prior to Admission medications   Medication Sig Start Date End Date Taking? Authorizing Provider  amLODipine (NORVASC) 10 MG tablet Take 1 tablet (10 mg total) by mouth daily. 11/28/14  Yes Quintella Reichert, MD  aspirin 81 MG tablet Take 81 mg by mouth daily.   Yes [provider]  cholecalciferol (VITAMIN D) 1000 units tablet Take 2,000 Units by mouth daily.   Yes [provider]  ezetimibe (ZETIA) 10 MG tablet Take 1 tablet (10 mg total) by mouth daily. 11/13/15  Yes Turner, Cornelious Bryant, MD  fish oil-omega-3 fatty acids 1000 MG capsule Take 2 g by mouth 2 (two) times daily.    Yes [provider]  furosemide (LASIX) 20 MG tablet TAKE 1 TABLET (20 MG TOTAL) BY MOUTH DAILY. 02/20/17  Yes Turner, Traci R, MD  glimepiride (AMARYL) 4 MG tablet Take 4 mg by mouth daily. 06/05/17  Yes [provider]  HUMALOG MIX 75/25 KWIKPEN (75-25) 100 UNIT/ML Kwikpen Inject 35 mLs as directed 2 (two) times daily.  03/07/14  Yes [provider]  isosorbide mononitrate (IMDUR) 60 MG 24 hr tablet TAKE 1 TABLET (60 MG TOTAL) BY MOUTH DAILY. 02/20/17  Yes Turner, Cornelious Bryant, MD  lisinopril (PRINIVIL,ZESTRIL) 10 MG tablet Take 10 mg by mouth daily.   Yes [provider]  metFORMIN (GLUCOPHAGE) 500 MG tablet Take 1,000 mg by mouth 2 (two) times daily with a meal.   Yes [provider]  metoprolol succinate (TOPROL-XL) 100 MG 24 hr tablet TAKE 1 TABLET BY MOUTH DAILY TAKE WITH OR IMMEDIATELY FOLLOWING A MEAL 01/28/17  Yes Turner, Traci R, MD  metoprolol succinate (TOPROL-XL) 25 MG 24 hr tablet TAKE  1 TABLET (25 MG TOTAL) BY MOUTH DAILY. TAKE WITH OR IMMEDIATELY FOLLOWING A MEAL. Patient taking differently: TAKE 3 TABLETS (125 MG TOTAL) BY MOUTH DAILY. TAKE WITH OR IMMEDIATELY FOLLOWING A MEAL. 01/28/17  Yes Turner, Cornelious Bryant, MD  Multiple Vitamin (MULTIVITAMIN WITH MINERALS) TABS Take 1 tablet by mouth daily.   Yes [provider]  olmesartan (BENICAR) 40 MG tablet Take 1 tablet (40 mg total) by mouth daily. 11/28/14  Yes Turner, Cornelious Bryant, MD  potassium chloride (K-DUR) 10 MEQ tablet Take 1 tablet (10 mEq total) by mouth daily. 12/09/13  Yes Weaver, Scott T, PA-C  pramipexole (MIRAPEX) 0.75 MG tablet Take 1.5 mg by mouth at bedtime.    Yes [provider]  pravastatin (PRAVACHOL) 40  MG tablet Take 40 mg by mouth daily.   Yes [provider]  spironolactone (ALDACTONE) 25 MG tablet Take 1 tablet (25 mg total) by mouth daily. 11/28/14  Yes Quintella Reichert, MD    Family History Family History  Problem Relation Age of Onset  . Hypertension Mother   . Stroke Father   . Hypertension Father   . Hypertension Paternal Grandfather   . Heart attack Neg Hx     Social History Social History   Tobacco Use  . Smoking status: Never Smoker  . Smokeless tobacco: Never Used  Substance Use Topics  . Alcohol use: Yes    Comment: 1 beer a month or less  . Drug use: No     Allergies   Lisinopril   Review of Systems Review of Systems  Constitutional: Positive for fever.  Respiratory: Positive for cough and shortness of breath.   Cardiovascular: Positive for leg swelling.  Gastrointestinal: Positive for abdominal distention. Negative for abdominal pain, constipation, diarrhea, nausea and vomiting.  Genitourinary: Negative for dysuria and hematuria.  All other systems reviewed and are negative.    Physical Exam Updated Vital Signs BP (!) 112/59 (BP Location: Right Arm)   Pulse 93   Temp 99.4 F (37.4 C) (Oral)   Resp (!) 34   Ht 6' (1.829 m)   Wt 128.4 kg  (283 lb)   SpO2 94%   BMI 38.38 kg/m   Physical Exam  Constitutional: He appears well-developed and well-nourished. No distress.  HENT:  Head: Normocephalic and atraumatic.  Eyes: Conjunctivae are normal. Right eye exhibits no discharge. Left eye exhibits no discharge.  Neck: Normal range of motion. Neck supple. No JVD present. No tracheal deviation present.  Cardiovascular: Intact distal pulses.  Tachycardic, distant heart sounds.  Significant bilateral lower extremity edema with venous stasis changes which appear to be chronic, L > R.  Left lower extremity measures 53 cm circumferentially around the widest part of the calf versus right lower extremity which measures 49 cm.  The left lower extremity exhibits erythema and warmth extending up to the mid thigh.  There is tenderness to palpation of this region.  There are multiple shallow weeping ulcerations to the left lower extremity.  2+ DP/PT pulses bilaterally.  Pulmonary/Chest: Effort normal.  Globally diminished breath sounds, scattered crackles.  Patient taking shallow inspirations and is tachypneic.  Speaking in short sentences.  Abdominal: He exhibits distension. There is no tenderness. There is no guarding.  Hypoactive bowel sounds  Musculoskeletal: He exhibits no edema.  Neurological: He is alert.  Skin: Skin is warm and dry. No erythema.  Psychiatric: He has a normal mood and affect. His behavior is normal.  Nursing note and vitals reviewed.    ED Treatments / Results  Labs (all labs ordered are listed, but only abnormal results are displayed) Labs Reviewed  COMPREHENSIVE METABOLIC PANEL - Abnormal; Notable for the following components:      Result Value   Chloride 98 (*)    Glucose, Bld 165 (*)    BUN 26 (*)    Creatinine, Ser 2.11 (*)    Total Bilirubin 1.4 (*)    GFR calc non Af Amer 30 (*)    GFR calc Af Amer 34 (*)    All other components within normal limits  CBC WITH DIFFERENTIAL/PLATELET - Abnormal; Notable  for the following components:   WBC 18.7 (*)    Neutro Abs 17.2 (*)    All other components within normal  limits  I-STAT CG4 LACTIC ACID, ED - Abnormal; Notable for the following components:   Lactic Acid, Venous 6.55 (*)    All other components within normal limits  I-STAT CG4 LACTIC ACID, ED - Abnormal; Notable for the following components:   Lactic Acid, Venous 3.71 (*)    All other components within normal limits  CBG MONITORING, ED - Abnormal; Notable for the following components:   Glucose-Capillary 167 (*)    All other components within normal limits  CULTURE, BLOOD (ROUTINE X 2)  CULTURE, BLOOD (ROUTINE X 2)  URINALYSIS, ROUTINE W REFLEX MICROSCOPIC  PROCALCITONIN  CBC  CREATININE, SERUM    EKG None  Radiology Ct Angio Chest Pe W And/or Wo Contrast  Result Date: 06/18/2017 CLINICAL DATA:  Shortness of breath, lower extremity edema, elevated D-dimer EXAM: CT ANGIOGRAPHY CHEST WITH CONTRAST TECHNIQUE: Multidetector CT imaging of the chest was performed using the standard protocol during bolus administration of intravenous contrast. Multiplanar CT image reconstructions and MIPs were obtained to evaluate the vascular anatomy. CONTRAST:  67mL ISOVUE-370 IOPAMIDOL (ISOVUE-370) INJECTION 76% COMPARISON:  Chest radiographs dated 12/08/2013 FINDINGS: Cardiovascular: Satisfactory opacification of the bilateral pulmonary arteries to the segmental level. No evidence of pulmonary embolism. No evidence of thoracic aortic aneurysm. Atherosclerotic calcifications of the aortic arch. Cardiomegaly.  No pericardial effusion. Three vessel atherosclerosis. Postsurgical changes related to prior CABG. Mediastinum/Nodes: No suspicious mediastinal lymphadenopathy. Visualized thyroid is unremarkable. Lungs/Pleura: Mild linear scarring/atelectasis in the left upper lobe. Masslike rounded opacity at the left lung base (series 6/image 122), likely reflecting rounded atelectasis, although technically  indeterminate. Mild dependent atelectasis in the right lower lobe. Mild ground-glass opacity/mosaic attenuation in the lungs bilaterally. 3 mm subpleural nodule in the right upper lobe (series 6/image 45), likely benign. No pleural effusion or pneumothorax. Upper Abdomen: Visualized upper abdomen is grossly unremarkable. Musculoskeletal: Mild degenerative changes of the visualized thoracolumbar spine. Median sternotomy. Review of the MIP images confirms the above findings. IMPRESSION: No evidence of pulmonary embolism. Masslike rounded opacity in the left lung base, likely reflecting rounded atelectasis, although technically indeterminate. Consider follow-up CT chest in 3 months. 3 mm subpleural nodule in the right upper lobe, likely benign. No follow-up needed if patient is low-risk. Non-contrast chest CT can be considered in 12 months if patient is high-risk. This recommendation follows the consensus statement: Guidelines for Management of Incidental Pulmonary Nodules Detected on CT Images: From the Fleischner Society 2017; Radiology 2017; 284:228-243. Aortic Atherosclerosis (ICD10-I70.0). Electronically Signed   By: Charline Bills M.D.   On: 06/18/2017 23:06   Dg Abdomen Acute W/chest  Result Date: 06/20/2017 CLINICAL DATA:  72 year old male with a history of abdominal distention and shortness of breath EXAM: DG ABDOMEN ACUTE W/ 1V CHEST COMPARISON:  CT 06/18/2017, no prior abdominal CT FINDINGS: Chest: Cardiomediastinal silhouette persistently enlarged. Surgical changes of median sternotomy and CABG. Low lung volumes with coarsened interstitial markings. Abdomen: Gas within stomach, small bowel, colon. No abnormal distention. Large stool burden. Gas extends to the rectum. No radiopaque foreign body. No unexpected calcification or soft tissue density. IMPRESSION: Chest: Low lung volumes with no evidence of lobar pneumonia. Surgical changes of median sternotomy and CABG. Abdomen: Nonobstructive bowel gas  pattern. Large stool burden, potentially representing constipation. Electronically Signed   By: Gilmer Mor D.O.   On: 06/20/2017 12:41    Procedures Procedures (including critical care time)  Medications Ordered in ED Medications  piperacillin-tazobactam (ZOSYN) IVPB 3.375 g (has no administration in time range)  vancomycin (VANCOCIN) IVPB  1000 mg/200 mL premix (has no administration in time range)  sodium chloride 0.9 % bolus 1,000 mL (1,000 mLs Intravenous Transfusing/Transfer 06/20/17 1535)    And  sodium chloride 0.9 % bolus 1,000 mL (1,000 mLs Intravenous Transfusing/Transfer 06/20/17 1535)    And  sodium chloride 0.9 % bolus 1,000 mL (has no administration in time range)  ezetimibe (ZETIA) tablet 10 mg (has no administration in time range)  metoprolol succinate (TOPROL-XL) 24 hr tablet 50 mg (has no administration in time range)  metoprolol succinate (TOPROL-XL) 24 hr tablet 75 mg (has no administration in time range)  irbesartan (AVAPRO) tablet 300 mg (has no administration in time range)  potassium chloride (K-DUR) CR tablet 10 mEq (has no administration in time range)  spironolactone (ALDACTONE) tablet 25 mg (has no administration in time range)  aspirin tablet 81 mg (has no administration in time range)  cholecalciferol (VITAMIN D) tablet 2,000 Units (has no administration in time range)  fish oil-omega-3 fatty acids capsule 2 g (has no administration in time range)  glimepiride (AMARYL) tablet 4 mg (has no administration in time range)  Insulin Lispro Prot & Lispro (HUMALOG 75/25 MIX) (75-25) 100 UNIT/ML KwikPen 35 Units (has no administration in time range)  multivitamin with minerals tablet 1 tablet (has no administration in time range)  pramipexole (MIRAPEX) tablet 1.5 mg (has no administration in time range)  pravastatin (PRAVACHOL) tablet 40 mg (has no administration in time range)  enoxaparin (LOVENOX) injection 30 mg (has no administration in time range)  sodium  chloride flush (NS) 0.9 % injection 3 mL (has no administration in time range)  acetaminophen (TYLENOL) tablet 650 mg (has no administration in time range)    Or  acetaminophen (TYLENOL) suppository 650 mg (has no administration in time range)  oxyCODONE (Oxy IR/ROXICODONE) immediate release tablet 5 mg (has no administration in time range)  magnesium citrate solution 1 Bottle (has no administration in time range)  ondansetron (ZOFRAN) tablet 4 mg (has no administration in time range)    Or  ondansetron (ZOFRAN) injection 4 mg (has no administration in time range)  insulin aspart (novoLOG) injection 0-20 Units (has no administration in time range)  insulin aspart (novoLOG) injection 0-5 Units (has no administration in time range)  insulin aspart (novoLOG) injection 4 Units (has no administration in time range)  piperacillin-tazobactam (ZOSYN) IVPB 3.375 g (0 g Intravenous Stopped 06/20/17 1325)  vancomycin (VANCOCIN) IVPB 1000 mg/200 mL premix (0 mg Intravenous Stopped 06/20/17 1501)  acetaminophen (TYLENOL) tablet 650 mg (650 mg Oral Given 06/20/17 1317)  sodium chloride 0.9 % bolus 1,000 mL (0 mLs Intravenous Stopped 06/20/17 1535)  morphine 4 MG/ML injection 4 mg (4 mg Intravenous Given 06/20/17 1315)  docusate sodium (COLACE) capsule 100 mg (100 mg Oral Given 06/20/17 1317)     Initial Impression / Assessment and Plan / ED Course  I have reviewed the triage vital signs and the nursing notes.  Pertinent labs & imaging results that were available during my care of the patient were reviewed by me and considered in my medical decision making (see chart for details).    Patient presents for evaluation today.  He initially did not know why he was brought to the ED but states that his daughter "made me come here to get checked out ".  Patient was found to be febrile to 102.1 F, tachypneic and tachycardic.  Found to have a elevated lactate of 6.5.  He had a leukocytosis of 18.7 and elevated creatinine of  2.11.  Concern for sepsis given erythema, warmth, and swelling of the left lower extremity which has apparently worsened while in the ED per rn. His blood pressure is also significantly lower than his baseline 2 days ago.  Code sepsis was initiated, blood cultures were obtained, and patient was placed on broad-spectrum antibiotics.  Spoke with Dr. Willette Pa with [facility] hospitalist service who agrees to assume care of patient and bring him into the hospital for further evaluation and management.  Patient seen and evaluated by Dr. Denton Lank who agrees with assessment and plan at this time.  CRITICAL CARE Performed by: Jeanie Sewer   Total critical care time: 40 minutes  Critical care time was exclusive of separately billable procedures and treating other patients.  Critical care was necessary to treat or prevent imminent or life-threatening deterioration.  Critical care was time spent personally by me on the following activities: development of treatment plan with patient and/or surrogate as well as nursing, discussions with consultants, evaluation of patient's response to treatment, examination of patient, obtaining history from patient or surrogate, ordering and performing treatments and interventions, ordering and review of laboratory studies, ordering and review of radiographic studies, pulse oximetry and re-evaluation of patient's condition.   Final Clinical Impressions(s) / ED Diagnoses   Final diagnoses:  Sepsis [facility])    ED Discharge Orders    None       Jeanie Sewer, PA-C 06/20/17 1559    Cathren Laine, MD 06/20/17 1622

## 2017-06-21 DIAGNOSIS — E118 Type 2 diabetes mellitus with unspecified complications: Secondary | ICD-10-CM

## 2017-06-21 DIAGNOSIS — E1165 Type 2 diabetes mellitus with hyperglycemia: Secondary | ICD-10-CM

## 2017-06-21 DIAGNOSIS — I5032 Chronic diastolic (congestive) heart failure: Secondary | ICD-10-CM

## 2017-06-21 DIAGNOSIS — I2510 Atherosclerotic heart disease of native coronary artery without angina pectoris: Secondary | ICD-10-CM

## 2017-06-21 LAB — BLOOD CULTURE ID PANEL (REFLEXED)
Acinetobacter baumannii: NOT DETECTED
CANDIDA KRUSEI: NOT DETECTED
CANDIDA PARAPSILOSIS: NOT DETECTED
Candida albicans: NOT DETECTED
Candida glabrata: NOT DETECTED
Candida tropicalis: NOT DETECTED
ENTEROCOCCUS SPECIES: NOT DETECTED
ESCHERICHIA COLI: NOT DETECTED
Enterobacter cloacae complex: NOT DETECTED
Enterobacteriaceae species: NOT DETECTED
HAEMOPHILUS INFLUENZAE: NOT DETECTED
Klebsiella oxytoca: NOT DETECTED
Klebsiella pneumoniae: NOT DETECTED
Listeria monocytogenes: NOT DETECTED
Neisseria meningitidis: NOT DETECTED
PROTEUS SPECIES: NOT DETECTED
PSEUDOMONAS AERUGINOSA: NOT DETECTED
SERRATIA MARCESCENS: NOT DETECTED
STAPHYLOCOCCUS AUREUS BCID: NOT DETECTED
STREPTOCOCCUS PNEUMONIAE: NOT DETECTED
STREPTOCOCCUS SPECIES: DETECTED — AB
Staphylococcus species: NOT DETECTED
Streptococcus agalactiae: NOT DETECTED
Streptococcus pyogenes: DETECTED — AB

## 2017-06-21 LAB — CBC
HCT: 41.8 % (ref 39.0–52.0)
HEMOGLOBIN: 14 g/dL (ref 13.0–17.0)
MCH: 29.2 pg (ref 26.0–34.0)
MCHC: 33.5 g/dL (ref 30.0–36.0)
MCV: 87.3 fL (ref 78.0–100.0)
PLATELETS: 179 10*3/uL (ref 150–400)
RBC: 4.79 MIL/uL (ref 4.22–5.81)
RDW: 13.7 % (ref 11.5–15.5)
WBC: 14.3 10*3/uL — ABNORMAL HIGH (ref 4.0–10.5)

## 2017-06-21 LAB — GLUCOSE, CAPILLARY
GLUCOSE-CAPILLARY: 64 mg/dL — AB (ref 65–99)
Glucose-Capillary: 129 mg/dL — ABNORMAL HIGH (ref 65–99)
Glucose-Capillary: 53 mg/dL — ABNORMAL LOW (ref 65–99)
Glucose-Capillary: 83 mg/dL (ref 65–99)
Glucose-Capillary: 142 mg/dL — ABNORMAL HIGH (ref 65–99)

## 2017-06-21 LAB — BASIC METABOLIC PANEL
Anion gap: 12 (ref 5–15)
BUN: 32 mg/dL — AB (ref 6–20)
CALCIUM: 8.3 mg/dL — AB (ref 8.9–10.3)
CO2: 26 mmol/L (ref 22–32)
CREATININE: 2.01 mg/dL — AB (ref 0.61–1.24)
Chloride: 97 mmol/L — ABNORMAL LOW (ref 101–111)
GFR calc Af Amer: 36 mL/min — ABNORMAL LOW (ref >60)
GFR calc non Af Amer: 31 mL/min — ABNORMAL LOW (ref >60)
GLUCOSE: 128 mg/dL — AB (ref 65–99)
Potassium: 4 mmol/L (ref 3.5–5.1)
Sodium: 135 mmol/L (ref 135–145)

## 2017-06-21 LAB — PROCALCITONIN: PROCALCITONIN: 24.04 ng/mL

## 2017-06-21 MED ORDER — SODIUM CHLORIDE 0.9 % IV SOLN
INTRAVENOUS | Status: AC
  Administered 2017-06-21: 11:00:00 via INTRAVENOUS

## 2017-06-21 MED ORDER — GLIMEPIRIDE 4 MG PO TABS
4.0000 mg | ORAL_TABLET | Freq: Every day | ORAL | Status: DC
  Administered 2017-06-21: 4 mg via ORAL
  Filled 2017-06-21: qty 1

## 2017-06-21 MED ORDER — SODIUM CHLORIDE 0.9 % IV SOLN
2.0000 g | INTRAVENOUS | Status: DC
  Administered 2017-06-21 – 2017-06-22 (×2): 2 g via INTRAVENOUS
  Filled 2017-06-21 (×2): qty 20

## 2017-06-21 MED ORDER — METOPROLOL SUCCINATE ER 100 MG PO TB24
100.0000 mg | ORAL_TABLET | Freq: Every day | ORAL | Status: DC
  Administered 2017-06-22 – 2017-06-26 (×5): 100 mg via ORAL
  Filled 2017-06-21 (×5): qty 1

## 2017-06-21 NOTE — Progress Notes (Signed)
PROGRESS NOTE    James [name]  ZOX:[id number] DOB: [birth year] DOA: 06/20/2017 PCP: Joycelyn Rua, MD  Brief Narrative:72 y.o. male with medical history significant  for DM2, obesity, , hypertension, hyperlipidemia, dilated aortic root, chronic diastolic CHF, GERD who presented to the ER  As family noted he was not acting right. FOund to have sepsis, fever, AKI, metabolic encephalopathy, left leg cellulitis    Assessment & Plan:   Group A strep sepsis -blood culture was positive for group A Streptococcus -Secondary to left leg cellulitis -Stop IV vancomycin and Zosyn, de-escalate antibiotics to ceftriaxone today -Repeat blood cultures -gentle IV fluids for now  Acute kidney injury  -Baseline creatinine is 1.3 -Kidney function worsened in the setting of sepsis -hold ACE/ ARB, Aldactone, continue IV fluids  CAD -Stable, continue aspirin -Restart metoprolol  Type 2 diabetes mellitus -CAD stable, continue insulin 7030, holding metformin and glimepiride  Chronic diastolic CHF -Clinically compensated,  -hold Lasix and Aldactone in the setting of sepsis and history of present illness -gentle IV fluids today, monitor himself closely  DVT prophylaxis:Lovenox Code Status: full code Family Communication:no family at bedside Disposition Plan: home pending improvement of sepsis, history of present illness and resolution of bacteremia  Consultants:   none   Procedures:   Antimicrobials:  Antibiotics Given (last 72 hours)    Date/Time Action Medication Dose Rate   06/20/17 1310 New Bag/Given   piperacillin-tazobactam (ZOSYN) IVPB 3.375 g 3.375 g 100 mL/hr   06/20/17 1313 New Bag/Given   vancomycin (VANCOCIN) IVPB 1000 mg/200 mL premix 1,000 mg 200 mL/hr   06/20/17 2153 New Bag/Given   piperacillin-tazobactam (ZOSYN) IVPB 3.375 g 3.375 g 12.5 mL/hr   06/21/17 0200 New Bag/Given   vancomycin (VANCOCIN) IVPB 1000 mg/200 mL premix 1,000 mg 200 mL/hr   06/21/17 0438 New  Bag/Given   piperacillin-tazobactam (ZOSYN) IVPB 3.375 g 3.375 g 12.5 mL/hr   06/21/17 1024 New Bag/Given   cefTRIAXone (ROCEPHIN) 2 g in sodium chloride 0.9 % 100 mL IVPB 2 g 200 mL/hr      Subjective: -feels better today, left leg continues to be and swollen, painful and red  Objective: Vitals:   06/20/17 2031 06/21/17 0437 06/21/17 0500 06/21/17 0959  BP: 132/63 (!) 132/50  139/76  Pulse: 95 (!) 104  (!) 101  Resp: Temp: 98 F (36.7 C) 99.6 F (37.6 C)  (!) 101.1 F (38.4 C)  TempSrc:  Oral  Oral  SpO2: 96% 95%  95%  Weight:   128.4 kg (283 lb 1.1 oz)   Height:        Intake/Output Summary (Last 24 hours) at 06/21/2017 1032 Last data filed at 06/21/2017 0400 Gross per 24 hour  Intake 2610 ml  Output 460 ml  Net 2150 ml   Filed Weights   06/20/17 1146 06/21/17 0500  Weight: 128.4 kg (283 lb) 128.4 kg (283 lb 1.1 oz)    Examination:  General exam: obese male, chronically ill-appearing, sitting in bed, no distress Respiratory system: coarse bilateral breath sounds Cardiovascular system: S1 & S2 heard, RRR. No JVD, murmurs, rubs, gallops Gastrointestinal system: Abdomen is nondistended, soft and nontender.Normal bowel sounds heard. Central nervous system: Alert and oriented. No focal neurological deficits. Extremities: 1+ edema on the right, left leg is profoundly swollen with erythema, tenderness and warmth and a small ulcer at the anterior left shin Skin: as above Psychiatry: Judgement and insight appear normal. Mood & affect appropriate.     Data Reviewed:  CBC: Recent Labs  Lab 06/18/17 1001 06/18/17 2151 06/20/17 1208 06/20/17 1709 06/21/17 0521  WBC 11.3* 10.2 18.7* 14.1* 14.3*  NEUTROABS  --  7.0 17.2*  --   --   HGB 14.5 14.5 14.4 11.3* 14.0  HCT 42.0 42.6 42.5 34.6* 41.8  MCV 85 86.6 86.6 87.6 87.3  PLT 252 233 207 159 179   Basic Metabolic Panel: Recent Labs  Lab 06/18/17 1001 06/18/17 2151 06/20/17 1208 06/20/17 1709  06/21/17 0521  NA 140 141 135  --  135  K 4.3 3.7 4.5  --  4.0  CL 101 102 98*  --  97*  CO2 --  26  GLUCOSE 68 94 165*  --  128*  BUN 14 14 26*  --  32*  CREATININE 1.25 1.31* 2.11* 1.72* 2.01*  CALCIUM 9.4 9.3 9.4  --  8.3*   GFR: Estimated Creatinine Clearance: 46 mL/min (A) (by C-G formula based on SCr of 2.01 mg/dL (H)). Liver Function Tests: Recent Labs  Lab 06/20/17 1208  AST 36  ALT 17  ALKPHOS 85  BILITOT 1.4*  PROT 7.1  ALBUMIN 3.7   No results for input(s): LIPASE, AMYLASE in the last 168 hours. No results for input(s): AMMONIA in the last 168 hours. Coagulation Profile: No results for input(s): INR, PROTIME in the last 168 hours. Cardiac Enzymes: No results for input(s): CKTOTAL, CKMB, CKMBINDEX, TROPONINI in the last 168 hours. BNP (last 3 results) Recent Labs    06/18/17 1001  PROBNP 274   HbA1C: No results for input(s): HGBA1C in the last 72 hours. CBG: Recent Labs  Lab 06/20/17 1537 06/20/17 1610 06/20/17 2031 06/21/17 0739  GLUCAP 167* 167* 93 129*   Lipid Profile: No results for input(s): CHOL, HDL, LDLCALC, TRIG, CHOLHDL, LDLDIRECT in the last 72 hours. Thyroid Function Tests: No results for input(s): TSH, T4TOTAL, FREET4, T3FREE, THYROIDAB in the last 72 hours. Anemia Panel: No results for input(s): VITAMINB12, FOLATE, FERRITIN, TIBC, IRON, RETICCTPCT in the last 72 hours. Urine analysis:    Component Value Date/Time   COLORURINE AMBER (A) 06/20/2017 1649   APPEARANCEUR CLOUDY (A) 06/20/2017 1649   LABSPEC 1.031 (H) 06/20/2017 1649   PHURINE 5.0 06/20/2017 1649   GLUCOSEU 50 (A) 06/20/2017 1649   HGBUR MODERATE (A) 06/20/2017 1649   BILIRUBINUR SMALL (A) 06/20/2017 1649   KETONESUR NEGATIVE 06/20/2017 1649   PROTEINUR >=300 (A) 06/20/2017 1649   UROBILINOGEN 1.0 10/24/2011 1742   NITRITE NEGATIVE 06/20/2017 1649   LEUKOCYTESUR NEGATIVE 06/20/2017 1649   Sepsis  Labs: (procalcitonin:4,lacticidven:4)  ) Recent Results (from the past 240 hour(s))  Blood culture (routine x 2)     Status: None (Preliminary result)   Collection Time: 06/20/17 12:13 PM  Result Value Ref Range Status   Specimen Description BLOOD LEFT ANTECUBITAL  Final   Special Requests   Final    BOTTLES DRAWN AEROBIC AND ANAEROBIC Blood Culture adequate volume   Culture  Setup Time   Final    GRAM POSITIVE COCCI IN CHAINS IN BOTH AEROBIC AND ANAEROBIC BOTTLES CRITICAL VALUE NOTED.  VALUE IS CONSISTENT WITH PREVIOUSLY REPORTED AND CALLED VALUE. Performed at [facility], [address], [place], [place] [address]    Culture GRAM POSITIVE COCCI  Final   Report Status PENDING  Incomplete  Blood culture (routine x 2)     Status: None (Preliminary result)   Collection Time: 06/20/17  1:04 PM  Result Value Ref Range Status  Specimen Description BLOOD RIGHT HAND  Final   Special Requests   Final    BOTTLES DRAWN AEROBIC AND ANAEROBIC Blood Culture adequate volume   Culture  Setup Time   Final    GRAM POSITIVE COCCI IN CHAINS IN BOTH AEROBIC AND ANAEROBIC BOTTLES CRITICAL RESULT CALLED TO, READ BACK BY AND VERIFIED WITH: L SEAY,PHARMD AT 0707 06/21/17 BY L BENFIELD Performed at [facility], [address] [place], [place] [address]    Culture GRAM POSITIVE COCCI  Final   Report Status PENDING  Incomplete  Blood Culture ID Panel (Reflexed)     Status: Abnormal   Collection Time: 06/20/17  1:04 PM  Result Value Ref Range Status   Enterococcus species NOT DETECTED NOT DETECTED Final   Listeria monocytogenes NOT DETECTED NOT DETECTED Final   Staphylococcus species NOT DETECTED NOT DETECTED Final   Staphylococcus aureus NOT DETECTED NOT DETECTED Final   Streptococcus species DETECTED (A) NOT DETECTED Final    Comment: CRITICAL RESULT CALLED TO, READ BACK BY AND VERIFIED WITH: L SEAY,PHARMD AT 0707 06/21/17 BY L BENFIELD    Streptococcus agalactiae NOT DETECTED  NOT DETECTED Final   Streptococcus pneumoniae NOT DETECTED NOT DETECTED Final   Streptococcus pyogenes DETECTED (A) NOT DETECTED Final    Comment: CRITICAL RESULT CALLED TO, READ BACK BY AND VERIFIED WITH: L SEAY,PHARMD AT 0707 06/21/17 BY L BENFIELD    Acinetobacter baumannii NOT DETECTED NOT DETECTED Final   Enterobacteriaceae species NOT DETECTED NOT DETECTED Final   Enterobacter cloacae complex NOT DETECTED NOT DETECTED Final   Escherichia coli NOT DETECTED NOT DETECTED Final   Klebsiella oxytoca NOT DETECTED NOT DETECTED Final   Klebsiella pneumoniae NOT DETECTED NOT DETECTED Final   Proteus species NOT DETECTED NOT DETECTED Final   Serratia marcescens NOT DETECTED NOT DETECTED Final   Haemophilus influenzae NOT DETECTED NOT DETECTED Final   Neisseria meningitidis NOT DETECTED NOT DETECTED Final   Pseudomonas aeruginosa NOT DETECTED NOT DETECTED Final   Candida albicans NOT DETECTED NOT DETECTED Final   Candida glabrata NOT DETECTED NOT DETECTED Final   Candida krusei NOT DETECTED NOT DETECTED Final   Candida parapsilosis NOT DETECTED NOT DETECTED Final   Candida tropicalis NOT DETECTED NOT DETECTED Final    Comment: Performed at [facility], [address] [place], [place] [address]         Radiology Studies: Dg Abdomen Acute W/chest  Result Date: 06/20/2017 CLINICAL DATA:  72 year old male with a history of abdominal distention and shortness of breath EXAM: DG ABDOMEN ACUTE W/ 1V CHEST COMPARISON:  CT 06/18/2017, no prior abdominal CT FINDINGS: Chest: Cardiomediastinal silhouette persistently enlarged. Surgical changes of median sternotomy and CABG. Low lung volumes with coarsened interstitial markings. Abdomen: Gas within stomach, small bowel, colon. No abnormal distention. Large stool burden. Gas extends to the rectum. No radiopaque foreign body. No unexpected calcification or soft tissue density. IMPRESSION: Chest: Low lung volumes with no evidence of lobar pneumonia.  Surgical changes of median sternotomy and CABG. Abdomen: Nonobstructive bowel gas pattern. Large stool burden, potentially representing constipation. Electronically Signed   By: Gilmer Mor D.O.   On: 06/20/2017 12:41        Scheduled Meds: . aspirin EC  81 mg Oral Daily  . cholecalciferol  2,000 Units Oral Daily  . enoxaparin (LOVENOX) injection  60 mg Subcutaneous Q24H  . ezetimibe  10 mg Oral Daily  . glimepiride  4 mg Oral Q breakfast  . insulin aspart  0-20 Units Subcutaneous TID WC  . insulin aspart  0-5 Units Subcutaneous QHS  . insulin aspart  4 Units Subcutaneous TID WC  . insulin aspart protamine- aspart  35 Units Subcutaneous BID AC  . metoprolol succinate  125 mg Oral Daily  . multivitamin with minerals  1 tablet Oral Daily  . omega-3 acid ethyl esters  2 g Oral BID  . potassium chloride  10 mEq Oral Daily  . pramipexole  1.5 mg Oral BID  . pravastatin  40 mg Oral Daily  . sodium chloride flush  3 mL Intravenous Q12H   Continuous Infusions: . cefTRIAXone (ROCEPHIN)  IV 2 g (06/21/17 1024)     LOS: 1 day    Time spent:    Zannie Cove, MD [facility] Page via [email or web address], password [email or web address] After 7PM please contact night-coverage  06/21/2017, 10:32 AM

## 2017-06-21 NOTE — Progress Notes (Signed)
PHARMACY - PHYSICIAN COMMUNICATION CRITICAL VALUE ALERT - BLOOD CULTURE IDENTIFICATION (BCID)  James [name] is an 72 y.o. male who presented to [facility] on 06/20/2017 with a chief complaint of cellulitis, sepsis  Assessment:  Group A strep in [facility]  Name of physician (or Provider) Contacted: Dr Jomarie Longs  Current antibiotics: vanc and zosyn  Changes to prescribed antibiotics recommended:  Rocephin 2gm IV q24 hours Recommendations accepted by provider  Results for orders placed or performed during the hospital encounter of 06/20/17  Blood Culture ID Panel (Reflexed) (Collected: 06/20/2017  1:04 PM)  Result Value Ref Range   Enterococcus species NOT DETECTED NOT DETECTED   Listeria monocytogenes NOT DETECTED NOT DETECTED   Staphylococcus species NOT DETECTED NOT DETECTED   Staphylococcus aureus NOT DETECTED NOT DETECTED   Streptococcus species DETECTED (A) NOT DETECTED   Streptococcus agalactiae NOT DETECTED NOT DETECTED   Streptococcus pneumoniae NOT DETECTED NOT DETECTED   Streptococcus pyogenes DETECTED (A) NOT DETECTED   Acinetobacter baumannii NOT DETECTED NOT DETECTED   Enterobacteriaceae species NOT DETECTED NOT DETECTED   Enterobacter cloacae complex NOT DETECTED NOT DETECTED   Escherichia coli NOT DETECTED NOT DETECTED   Klebsiella oxytoca NOT DETECTED NOT DETECTED   Klebsiella pneumoniae NOT DETECTED NOT DETECTED   Proteus species NOT DETECTED NOT DETECTED   Serratia marcescens NOT DETECTED NOT DETECTED   Haemophilus influenzae NOT DETECTED NOT DETECTED   Neisseria meningitidis NOT DETECTED NOT DETECTED   Pseudomonas aeruginosa NOT DETECTED NOT DETECTED   Candida albicans NOT DETECTED NOT DETECTED   Candida glabrata NOT DETECTED NOT DETECTED   Candida krusei NOT DETECTED NOT DETECTED   Candida parapsilosis NOT DETECTED NOT DETECTED   Candida tropicalis NOT DETECTED NOT DETECTED    [name] 06/21/2017  7:23 AM

## 2017-06-22 ENCOUNTER — Telehealth: Payer: Self-pay | Admitting: Cardiology

## 2017-06-22 LAB — CBC
HEMATOCRIT: 39.1 % (ref 39.0–52.0)
HEMOGLOBIN: 12.9 g/dL — AB (ref 13.0–17.0)
MCH: 28.7 pg (ref 26.0–34.0)
MCHC: 33 g/dL (ref 30.0–36.0)
MCV: 86.9 fL (ref 78.0–100.0)
Platelets: 177 10*3/uL (ref 150–400)
RBC: 4.5 MIL/uL (ref 4.22–5.81)
RDW: 13.7 % (ref 11.5–15.5)
WBC: 13.4 10*3/uL — ABNORMAL HIGH (ref 4.0–10.5)

## 2017-06-22 LAB — BASIC METABOLIC PANEL
Anion gap: 10 (ref 5–15)
BUN: 28 mg/dL — AB (ref 6–20)
CHLORIDE: 101 mmol/L (ref 101–111)
CO2: 23 mmol/L (ref 22–32)
CREATININE: 1.53 mg/dL — AB (ref 0.61–1.24)
Calcium: 7.8 mg/dL — ABNORMAL LOW (ref 8.9–10.3)
GFR calc non Af Amer: 44 mL/min — ABNORMAL LOW (ref >60)
GFR, EST AFRICAN AMERICAN: 51 mL/min — AB (ref >60)
Glucose, Bld: 124 mg/dL — ABNORMAL HIGH (ref 65–99)
Potassium: 3.9 mmol/L (ref 3.5–5.1)
Sodium: 134 mmol/L — ABNORMAL LOW (ref 135–145)

## 2017-06-22 LAB — PROCALCITONIN: Procalcitonin: 13.64 ng/mL

## 2017-06-22 LAB — GLUCOSE, CAPILLARY
GLUCOSE-CAPILLARY: 106 mg/dL — AB (ref 65–99)
GLUCOSE-CAPILLARY: 74 mg/dL (ref 65–99)
GLUCOSE-CAPILLARY: 63 mg/dL — AB (ref 65–99)
Glucose-Capillary: 117 mg/dL — ABNORMAL HIGH (ref 65–99)
Glucose-Capillary: 119 mg/dL — ABNORMAL HIGH (ref 65–99)

## 2017-06-22 MED ORDER — POTASSIUM CHLORIDE CRYS ER 20 MEQ PO TBCR
40.0000 meq | EXTENDED_RELEASE_TABLET | Freq: Every day | ORAL | Status: DC
  Administered 2017-06-22 – 2017-06-26 (×5): 40 meq via ORAL
  Filled 2017-06-22 (×5): qty 2

## 2017-06-22 MED ORDER — FUROSEMIDE 10 MG/ML IJ SOLN
20.0000 mg | Freq: Two times a day (BID) | INTRAMUSCULAR | Status: DC
  Administered 2017-06-22 – 2017-06-23 (×4): 20 mg via INTRAVENOUS
  Filled 2017-06-22 (×4): qty 2

## 2017-06-22 MED ORDER — PENICILLIN G POT IN DEXTROSE 60000 UNIT/ML IV SOLN
3.0000 10*6.[IU] | INTRAVENOUS | Status: DC
  Filled 2017-06-22: qty 50

## 2017-06-22 MED ORDER — CLINDAMYCIN PHOSPHATE 900 MG/50ML IV SOLN
900.0000 mg | Freq: Three times a day (TID) | INTRAVENOUS | Status: DC
  Administered 2017-06-22 – 2017-06-24 (×7): 900 mg via INTRAVENOUS
  Filled 2017-06-22 (×8): qty 50

## 2017-06-22 MED ORDER — PENICILLIN G POTASSIUM 5000000 UNITS IJ SOLR
3.0000 10*6.[IU] | INTRAVENOUS | Status: DC
  Administered 2017-06-23 – 2017-06-26 (×19): 3 10*6.[IU] via INTRAVENOUS
  Filled 2017-06-22 (×22): qty 3

## 2017-06-22 NOTE — Progress Notes (Signed)
PROGRESS NOTE    James [name]  YQM:[id number] DOB: 06-Apr-1945 DOA: 06/20/2017 PCP: Joycelyn Rua, MD  Brief Narrative:72 y.o. male with medical history significant  for DM2, obesity, , hypertension, hyperlipidemia, dilated aortic root, chronic diastolic CHF, GERD who presented to the ER  As family noted he was not acting right. FOund to have sepsis, fever, AKI, metabolic encephalopathy, left leg cellulitis -improving  Assessment & Plan:   Group A strep sepsis -blood culture was positive for group A Streptococcus -Secondary to left leg cellulitis -off  IV vancomycin and Zosyn, de-escalate antibiotics to Penicillin and CLinda today -Repeat blood cultures negative so far -stop IVF  Acute kidney injury  -Baseline creatinine is 1.3 -Kidney function worsened in the setting of sepsis -hold ACE/ ARB, Aldactone -creatinine improving   CAD -Stable, continue aspirin -Restart metoprolol  Type 2 diabetes mellitus -CAD stable, continue insulin 70/30, holding metformin and glimepiride  Acute on Chronic diastolic CHF -volume overloaded -IV lasix today -off IVF  DVT prophylaxis:Lovenox Code Status: full code Family Communication:no family at bedside Disposition Plan: home pending improvement of sepsis, history of present illness and resolution of bacteremia  Consultants:   none   Procedures:   Antimicrobials:  Antibiotics Given (last 72 hours)    Date/Time Action Medication Dose Rate   06/20/17 1310 New Bag/Given   piperacillin-tazobactam (ZOSYN) IVPB 3.375 g 3.375 g 100 mL/hr   06/20/17 1313 New Bag/Given   vancomycin (VANCOCIN) IVPB 1000 mg/200 mL premix 1,000 mg 200 mL/hr   06/20/17 2153 New Bag/Given   piperacillin-tazobactam (ZOSYN) IVPB 3.375 g 3.375 g 12.5 mL/hr   06/21/17 0200 New Bag/Given   vancomycin (VANCOCIN) IVPB 1000 mg/200 mL premix 1,000 mg 200 mL/hr   06/21/17 0438 New Bag/Given   piperacillin-tazobactam (ZOSYN) IVPB 3.375 g 3.375 g 12.5 mL/hr   06/21/17 1024 New Bag/Given   cefTRIAXone (ROCEPHIN) 2 g in sodium chloride 0.9 % 100 mL IVPB 2 g 200 mL/hr   06/22/17 0838 New Bag/Given   cefTRIAXone (ROCEPHIN) 2 g in sodium chloride 0.9 % 100 mL IVPB 2 g 200 mL/hr      Subjective: -feels better today, left leg continues to be and swollen, painful and red  Objective: Vitals:   06/21/17 2107 06/22/17 0500 06/22/17 0624 06/22/17 0739  BP: 127/67  (!) 142/82 139/84  Pulse: 80  80 80  Resp: Temp: 98.9 F (37.2 C)  98.7 F (37.1 C) 99.9 F (37.7 C)  TempSrc:   Oral Oral  SpO2: 97%  97% 98%  Weight: 128.4 kg (283 lb 1.1 oz) 128.4 kg (283 lb 1.1 oz)    Height:        Intake/Output Summary (Last 24 hours) at 06/22/2017 1135 Last data filed at 06/22/2017 5284 Gross per 24 hour  Intake 1030 ml  Output 1100 ml  Net -70 ml   Filed Weights   06/21/17 0500 06/21/17 2107 06/22/17 0500  Weight: 128.4 kg (283 lb 1.1 oz) 128.4 kg (283 lb 1.1 oz) 128.4 kg (283 lb 1.1 oz)    Examination:  Gen: Awake, Alert, Oriented X 3, obesechronically ill-appearing male sitting in bed HEENT: PERRLA, Neck supple, + JVD Lungs: bibasilar crackles CVS: RRR,No Gallops,Rubs or new Murmurs Abd: soft, Non tender, non distended, BS present Extremities:  1+ edema on the right, left leg is profoundly swollen with erythema, tenderness and warmth and a small ulcer at the anterior left shin Skin: as above Psychiatry: Judgement and insight appear normal. Mood &  affect appropriate.     Data Reviewed:   CBC: Recent Labs  Lab 06/18/17 2151 06/20/17 1208 06/20/17 1709 06/21/17 0521 06/22/17 0525  WBC 10.2 18.7* 14.1* 14.3* 13.4*  NEUTROABS 7.0 17.2*  --   --   --   HGB 14.5 14.4 11.3* 14.0 12.9*  HCT 42.6 42.5 34.6* 41.8 39.1  MCV 86.6 86.6 87.6 87.3 86.9  PLT 233 207 159 179 177   Basic Metabolic Panel: Recent Labs  Lab 06/18/17 1001 06/18/17 2151 06/20/17 1208 06/20/17 1709 06/21/17 0521 06/22/17 0525  NA 140 141 135  --  135 134*   K 4.3 3.7 4.5  --  4.0 3.9  CL 101 102 98*  --  97* 101  CO2 --  26 23  GLUCOSE 68 94 165*  --  128* 124*  BUN 14 14 26*  --  32* 28*  CREATININE 1.25 1.31* 2.11* 1.72* 2.01* 1.53*  CALCIUM 9.4 9.3 9.4  --  8.3* 7.8*   GFR: Estimated Creatinine Clearance: 60.4 mL/min (A) (by C-G formula based on SCr of 1.53 mg/dL (H)). Liver Function Tests: Recent Labs  Lab 06/20/17 1208  AST 36  ALT 17  ALKPHOS 85  BILITOT 1.4*  PROT 7.1  ALBUMIN 3.7   No results for input(s): LIPASE, AMYLASE in the last 168 hours. No results for input(s): AMMONIA in the last 168 hours. Coagulation Profile: No results for input(s): INR, PROTIME in the last 168 hours. Cardiac Enzymes: No results for input(s): CKTOTAL, CKMB, CKMBINDEX, TROPONINI in the last 168 hours. BNP (last 3 results) Recent Labs    06/18/17 1001  PROBNP 274   HbA1C: No results for input(s): HGBA1C in the last 72 hours. CBG: Recent Labs  Lab 06/21/17 1200 06/21/17 1712 06/21/17 1801 06/21/17 2106 06/22/17 0738  GLUCAP 64* 53* 83 142* 106*   Lipid Profile: No results for input(s): CHOL, HDL, LDLCALC, TRIG, CHOLHDL, LDLDIRECT in the last 72 hours. Thyroid Function Tests: No results for input(s): TSH, T4TOTAL, FREET4, T3FREE, THYROIDAB in the last 72 hours. Anemia Panel: No results for input(s): VITAMINB12, FOLATE, FERRITIN, TIBC, IRON, RETICCTPCT in the last 72 hours. Urine analysis:    Component Value Date/Time   COLORURINE AMBER (A) 06/20/2017 1649   APPEARANCEUR CLOUDY (A) 06/20/2017 1649   LABSPEC 1.031 (H) 06/20/2017 1649   PHURINE 5.0 06/20/2017 1649   GLUCOSEU 50 (A) 06/20/2017 1649   HGBUR MODERATE (A) 06/20/2017 1649   BILIRUBINUR SMALL (A) 06/20/2017 1649   KETONESUR NEGATIVE 06/20/2017 1649   PROTEINUR >=300 (A) 06/20/2017 1649   UROBILINOGEN 1.0 10/24/2011 1742   NITRITE NEGATIVE 06/20/2017 1649   LEUKOCYTESUR NEGATIVE 06/20/2017 1649   Sepsis  Labs: (procalcitonin:4,lacticidven:4)  ) Recent Results (from the past 240 hour(s))  Blood culture (routine x 2)     Status: Abnormal (Preliminary result)   Collection Time: 06/20/17 12:13 PM  Result Value Ref Range Status   Specimen Description BLOOD LEFT ANTECUBITAL  Final   Special Requests   Final    BOTTLES DRAWN AEROBIC AND ANAEROBIC Blood Culture adequate volume   Culture  Setup Time   Final    GRAM POSITIVE COCCI IN CHAINS IN BOTH AEROBIC AND ANAEROBIC BOTTLES CRITICAL VALUE NOTED.  VALUE IS CONSISTENT WITH PREVIOUSLY REPORTED AND CALLED VALUE. Performed at [facility], [address] [place], [place] [address]    Culture GROUP A STREP (S.PYOGENES) ISOLATED (A)  Final   Report Status PENDING  Incomplete  Blood culture (  routine x 2)     Status: Abnormal (Preliminary result)   Collection Time: 06/20/17  1:04 PM  Result Value Ref Range Status   Specimen Description BLOOD RIGHT HAND  Final   Special Requests   Final    BOTTLES DRAWN AEROBIC AND ANAEROBIC Blood Culture adequate volume   Culture  Setup Time   Final    GRAM POSITIVE COCCI IN CHAINS IN BOTH AEROBIC AND ANAEROBIC BOTTLES CRITICAL RESULT CALLED TO, READ BACK BY AND VERIFIED WITH: L SEAY,PHARMD AT 0707 06/21/17 BY L BENFIELD    Culture (A)  Final    GROUP A STREP (S.PYOGENES) ISOLATED SUSCEPTIBILITIES TO FOLLOW Performed at [facility], [address], [place], [place] [address]    Report Status PENDING  Incomplete  Blood Culture ID Panel (Reflexed)     Status: Abnormal   Collection Time: 06/20/17  1:04 PM  Result Value Ref Range Status   Enterococcus species NOT DETECTED NOT DETECTED Final   Listeria monocytogenes NOT DETECTED NOT DETECTED Final   Staphylococcus species NOT DETECTED NOT DETECTED Final   Staphylococcus aureus NOT DETECTED NOT DETECTED Final   Streptococcus species DETECTED (A) NOT DETECTED Final    Comment: CRITICAL RESULT CALLED TO, READ BACK BY AND VERIFIED WITH: L  SEAY,PHARMD AT 0707 06/21/17 BY L BENFIELD    Streptococcus agalactiae NOT DETECTED NOT DETECTED Final   Streptococcus pneumoniae NOT DETECTED NOT DETECTED Final   Streptococcus pyogenes DETECTED (A) NOT DETECTED Final    Comment: CRITICAL RESULT CALLED TO, READ BACK BY AND VERIFIED WITH: L SEAY,PHARMD AT 0707 06/21/17 BY L BENFIELD    Acinetobacter baumannii NOT DETECTED NOT DETECTED Final   Enterobacteriaceae species NOT DETECTED NOT DETECTED Final   Enterobacter cloacae complex NOT DETECTED NOT DETECTED Final   Escherichia coli NOT DETECTED NOT DETECTED Final   Klebsiella oxytoca NOT DETECTED NOT DETECTED Final   Klebsiella pneumoniae NOT DETECTED NOT DETECTED Final   Proteus species NOT DETECTED NOT DETECTED Final   Serratia marcescens NOT DETECTED NOT DETECTED Final   Haemophilus influenzae NOT DETECTED NOT DETECTED Final   Neisseria meningitidis NOT DETECTED NOT DETECTED Final   Pseudomonas aeruginosa NOT DETECTED NOT DETECTED Final   Candida albicans NOT DETECTED NOT DETECTED Final   Candida glabrata NOT DETECTED NOT DETECTED Final   Candida krusei NOT DETECTED NOT DETECTED Final   Candida parapsilosis NOT DETECTED NOT DETECTED Final   Candida tropicalis NOT DETECTED NOT DETECTED Final    Comment: Performed at [facility], [address], [place], [place] [address]  Culture, blood (routine x 2)     Status: None (Preliminary result)   Collection Time: 06/21/17  9:50 AM  Result Value Ref Range Status   Specimen Description BLOOD LEFT ANTECUBITAL  Final   Special Requests   Final    BOTTLES DRAWN AEROBIC ONLY Blood Culture adequate volume   Culture   Final    NO GROWTH 1 DAY Performed at [facility], [address] [place], [place] [address]    Report Status PENDING  Incomplete  Culture, blood (routine x 2)     Status: None (Preliminary result)   Collection Time: 06/21/17  9:55 AM  Result Value Ref Range Status   Specimen Description BLOOD RIGHT ANTECUBITAL   Final   Special Requests   Final    BOTTLES DRAWN AEROBIC ONLY Blood Culture adequate volume   Culture   Final    NO GROWTH 1 DAY Performed at  [facility], [address] [place], [place] [address]    Report Status PENDING  Incomplete         Radiology Studies: Dg Abdomen Acute W/chest  Result Date: 06/20/2017 CLINICAL DATA:  72 year old male with a history of abdominal distention and shortness of breath EXAM: DG ABDOMEN ACUTE W/ 1V CHEST COMPARISON:  CT 06/18/2017, no prior abdominal CT FINDINGS: Chest: Cardiomediastinal silhouette persistently enlarged. Surgical changes of median sternotomy and CABG. Low lung volumes with coarsened interstitial markings. Abdomen: Gas within stomach, small bowel, colon. No abnormal distention. Large stool burden. Gas extends to the rectum. No radiopaque foreign body. No unexpected calcification or soft tissue density. IMPRESSION: Chest: Low lung volumes with no evidence of lobar pneumonia. Surgical changes of median sternotomy and CABG. Abdomen: Nonobstructive bowel gas pattern. Large stool burden, potentially representing constipation. Electronically Signed   By: Gilmer Mor D.O.   On: 06/20/2017 12:41        Scheduled Meds: . aspirin EC  81 mg Oral Daily  . cholecalciferol  2,000 Units Oral Daily  . enoxaparin (LOVENOX) injection  60 mg Subcutaneous Q24H  . ezetimibe  10 mg Oral Daily  . furosemide  20 mg Intravenous Q12H  . insulin aspart  0-20 Units Subcutaneous TID WC  . insulin aspart  0-5 Units Subcutaneous QHS  . insulin aspart  4 Units Subcutaneous TID WC  . insulin aspart protamine- aspart  35 Units Subcutaneous BID AC  . metoprolol succinate  100 mg Oral Daily  . multivitamin with minerals  1 tablet Oral Daily  . omega-3 acid ethyl esters  2 g Oral BID  . potassium chloride  40 mEq Oral Daily  . pramipexole  1.5 mg Oral BID  . pravastatin  40 mg Oral Daily  . sodium chloride flush  3 mL Intravenous Q12H   Continuous  Infusions: . cefTRIAXone (ROCEPHIN)  IV 2 g (06/22/17 6045)     LOS: 2 days    Time spent:    Zannie Cove, MD [facility] Page via [email or web address], password [email or web address] After 7PM please contact night-coverage  06/22/2017, 11:35 AM  will

## 2017-06-22 NOTE — Progress Notes (Signed)
Pharmacy note - antibiotics  Group A strep isolated from blood cultures - sensitivity pending.  Source is leg cellulitis  Tmax 101.1  He is currently on ceftriaxone.  Contacted Dr. Jomarie Longs about narrowing antibiotics further to penicillin and also adding clindamycin for toxin inhibition.  Plan: Discontinue ceftriaxone Start penicillin 3 million units q4h  Start clincamycin  q8h x 3 days  Celedonio Miyamoto, PharmD, BCPS-AQ ID Clinical Pharmacist Pager [phone]

## 2017-06-22 NOTE — Care Management Note (Addendum)
Case Management Note  Patient Details  Name: James [name] MRN: [id number] Date of Birth: 1945-05-07  Subjective/Objective:  History for DM2, obesity, , hypertension, hyperlipidemia, dilated aortic root, chronic diastolic CHF, GERD; admitted for Sepsis, AKI, metabolic encephalopathy, left leg cellulitis.               Action/Plan: PCP noted.  Prior to admission patient lived at home.  At discharge patient plans to return to the same living conditions.  Patient able to afford medications/food.  At discharge patient has transportation home.  NCM will continue to follow for discharge transition needs.  Expected Discharge Date:    To Be Determined              Expected Discharge Plan:    To Be Determined In-House Referral:   N/A  Discharge planning Services  CM Consult  Status of Service:  In process, will continue to follow  Yancey Flemings, RN 06/22/2017, 11:07 AM

## 2017-06-22 NOTE — Progress Notes (Signed)
Inpatient Diabetes Program Recommendations  AACE/ADA: New Consensus Statement on Inpatient Glycemic Control (2015)  Target Ranges:  Prepandial:   less than 140 mg/dL      Peak postprandial:   less than 180 mg/dL (1-2 hours)      Critically ill patients:  140 - 180 mg/dL   Lab Results  Component Value Date   GLUCAP 117 (H) 06/22/2017   HGBA1C 7.8 (H) 10/23/2011    Review of Glycemic ControlResults for BEJAMIN, HACKBART (MRN [id number]) as of 06/22/2017 12:46  Ref. Range 06/21/2017 12:00 06/21/2017 17:12 06/21/2017 18:01 06/21/2017 21:06 06/22/2017 07:38 06/22/2017 11:40  Glucose-Capillary Latest Ref Range: 65 - 99 mg/dL 64 (L) 53 (L) 83 086 (H) 106 (H) 117 (H)    Diabetes history: Type 2 DM  Outpatient Diabetes medications: Humalog 75/25-35 units bid, Metformin 1000 mg bid, Amaryl 4 mg daily Current orders for Inpatient glycemic control:  Novolog resistant tid with meals and HS, Novolog 4 units tid with meals, Novolog mix 70/30-35 units bid Inpatient Diabetes Program Recommendations:   Note low blood sugars on 06/21/17.  Please d/c Novolog meal coverage 4 units since patient is on Novolog 70/30 mix.  Text page sent to Dr. Jomarie Longs.   Thanks,  Beryl Meager, RN, BC-ADM Inpatient Diabetes Coordinator Pager [phone] (8a-5p)

## 2017-06-22 NOTE — Telephone Encounter (Signed)
New Message:      Pt states he is currently in the hosp and has been there for 4 days now. Pt wants to know what he should do about his appt or is it something he can have done in the hosp.

## 2017-06-22 NOTE — Telephone Encounter (Signed)
Patient is in the hospital and does not think he will be out and able to have test by this week. Will send message to scheduling to get the patient's appointments moved.

## 2017-06-23 LAB — GLUCOSE, CAPILLARY
GLUCOSE-CAPILLARY: 178 mg/dL — AB (ref 65–99)
GLUCOSE-CAPILLARY: 214 mg/dL — AB (ref 65–99)
Glucose-Capillary: 67 mg/dL (ref 65–99)
Glucose-Capillary: 157 mg/dL — ABNORMAL HIGH (ref 65–99)

## 2017-06-23 LAB — CBC
HEMATOCRIT: 38.5 % — AB (ref 39.0–52.0)
Hemoglobin: 12.8 g/dL — ABNORMAL LOW (ref 13.0–17.0)
MCH: 29 pg (ref 26.0–34.0)
MCHC: 33.2 g/dL (ref 30.0–36.0)
MCV: 87.1 fL (ref 78.0–100.0)
PLATELETS: 197 10*3/uL (ref 150–400)
RBC: 4.42 MIL/uL (ref 4.22–5.81)
RDW: 13.6 % (ref 11.5–15.5)
WBC: 12.1 10*3/uL — AB (ref 4.0–10.5)

## 2017-06-23 LAB — CULTURE, BLOOD (ROUTINE X 2): SPECIAL REQUESTS: ADEQUATE

## 2017-06-23 LAB — BASIC METABOLIC PANEL
ANION GAP: 11 (ref 5–15)
BUN: 27 mg/dL — ABNORMAL HIGH (ref 6–20)
CO2: 26 mmol/L (ref 22–32)
Calcium: 8.2 mg/dL — ABNORMAL LOW (ref 8.9–10.3)
Chloride: 98 mmol/L — ABNORMAL LOW (ref 101–111)
Creatinine, Ser: 1.52 mg/dL — ABNORMAL HIGH (ref 0.61–1.24)
GFR calc Af Amer: 51 mL/min — ABNORMAL LOW (ref >60)
GFR, EST NON AFRICAN AMERICAN: 44 mL/min — AB (ref >60)
GLUCOSE: 68 mg/dL (ref 65–99)
POTASSIUM: 3.5 mmol/L (ref 3.5–5.1)
Sodium: 135 mmol/L (ref 135–145)

## 2017-06-23 MED ORDER — INSULIN ASPART PROT & ASPART (70-30 MIX) 100 UNIT/ML ~~LOC~~ SUSP: 20.0000 [IU] | Freq: Two times a day (BID) | SUBCUTANEOUS | Status: DC

## 2017-06-23 MED ORDER — INSULIN ASPART PROT & ASPART (70-30 MIX) 100 UNIT/ML ~~LOC~~ SUSP
20.0000 [IU] | Freq: Two times a day (BID) | SUBCUTANEOUS | Status: DC
  Administered 2017-06-23 – 2017-06-26 (×6): 20 [IU] via SUBCUTANEOUS

## 2017-06-23 NOTE — Progress Notes (Signed)
Inpatient Diabetes Program Recommendations  AACE/ADA: New Consensus Statement on Inpatient Glycemic Control (2015)  Target Ranges:  Prepandial:   less than 140 mg/dL      Peak postprandial:   less than 180 mg/dL (1-2 hours)      Critically ill patients:  140 - 180 mg/dL   Lab Results  Component Value Date   GLUCAP 67 06/23/2017   HGBA1C 7.8 (H) 10/23/2011    Review of Glycemic ControlResults for James [name], James [name] (MRN [id number]) as of 06/23/2017 11:11  Ref. Range 06/22/2017 11:40 06/22/2017 16:31 06/22/2017 21:19 06/22/2017 21:59 06/23/2017 07:31  Glucose-Capillary Latest Ref Range: 65 - 99 mg/dL 409 (H) 74 63 (L) 811 (H) 67    Diabetes history: Type 2 DM  Outpatient Diabetes medications: Humalog 75/25-35 units bid, Metformin 1000 mg bid, Amaryl 4 mg daily Current orders for Inpatient glycemic control:  Novolog resistant tid with meals and HS,  Novolog mix 70/30-35 units bid  Inpatient Diabetes Program Recommendations:   Note blood sugars<100 mg/dL.  Please reduce Novolog 70/30 mix to 30 units bid and reduce Novolog correction to sensitive.   Thanks,  Beryl Meager, RN, BC-ADM Inpatient Diabetes Coordinator Pager [phone] (8a-5p)

## 2017-06-23 NOTE — Progress Notes (Signed)
PROGRESS NOTE    James [name]  CZY:[id number] DOB: 1945-08-24 DOA: 06/20/2017 PCP: Orpah Melter, MD  Brief Narrative:72 y.o. male with medical history significant  for DM2, obesity, , hypertension, hyperlipidemia, dilated aortic root, chronic diastolic CHF, GERD who presented to the ER  As family noted he was not acting right. FOund to have sepsis, fever, AKI, metabolic encephalopathy, left leg cellulitis, Group A strep bacteremia -improving -volume overloaded, started IV lasix  Assessment & Plan:   Group A strep sepsis/Severe Cellulitis of L Lower leg -blood culture was positive for group A Streptococcus -was initially on IV vancomycin and Zosyn, antibiotics descalated to IV penicillin and clindamycin added for toxin production due to severe cellulitis -if he worsens will need imaging -Repeat blood cultures negative so far -stopped IV fluids, IV Lasix today -Elevate left leg  Acute metabolic encephalopathy -Secondary to sepsis, resolved  Acute kidney injury  -Baseline creatinine is 1.3 -Kidney function worsened to 2 in the setting of sepsis -hold ACE/ ARB, Aldactone -creatinine improving, now stable at 1.5 -Continue IV Lasix today due to volume overloaded state, follow-up B met  CAD -Stable, continue aspirin -continue metoprolol  Type 2 diabetes mellitus -with a.m. Hypoglycemia will cut down insulin 70/30 dose,  -holding metformin and glimepiride  Acute on Chronic diastolic CHF -volume overloaded, improving on IV Lasix -Continue IV Lasix again today, IV fluids discontinued yesterday -monitor Bmet  DVT prophylaxis:Lovenox Code Status: full code Family Communication:no family at bedside Disposition Plan: home pending improvement of severe cellulitis, history of present illness and resolution of bacteremia  Consultants:   none   Procedures:   Antimicrobials:  Antibiotics Given (last 72 hours)    Date/Time Action Medication Dose Rate   06/20/17 1310 New  Bag/Given   piperacillin-tazobactam (ZOSYN) IVPB 3.375 g 3.375 g 100 mL/hr   06/20/17 1313 New Bag/Given   vancomycin (VANCOCIN) IVPB 1000 mg/200 mL premix 1,000 mg 200 mL/hr   06/20/17 2153 New Bag/Given   piperacillin-tazobactam (ZOSYN) IVPB 3.375 g 3.375 g 12.5 mL/hr   06/21/17 0200 New Bag/Given   vancomycin (VANCOCIN) IVPB 1000 mg/200 mL premix 1,000 mg 200 mL/hr   06/21/17 0438 New Bag/Given   piperacillin-tazobactam (ZOSYN) IVPB 3.375 g 3.375 g 12.5 mL/hr   06/21/17 1024 New Bag/Given   cefTRIAXone (ROCEPHIN) 2 g in sodium chloride 0.9 % 100 mL IVPB 2 g 200 mL/hr   06/22/17 0838 New Bag/Given   cefTRIAXone (ROCEPHIN) 2 g in sodium chloride 0.9 % 100 mL IVPB 2 g 200 mL/hr   06/22/17 1345 New Bag/Given   clindamycin (CLEOCIN) IVPB 900 mg 900 mg 100 mL/hr   06/22/17 2147 New Bag/Given   clindamycin (CLEOCIN) IVPB 900 mg 900 mg 100 mL/hr   06/23/17 0536 New Bag/Given   clindamycin (CLEOCIN) IVPB 900 mg 900 mg 100 mL/hr   06/23/17 0853 New Bag/Given   penicillin G potassium 3 Million Units in dextrose 5 % 100 mL IVPB 3 Million Units 200 mL/hr      Subjective: -feels better today, appetite is improving, left leg continues to be significantly swollen and tender  Objective: Vitals:   06/22/17 1548 06/22/17 2021 06/23/17 0441 06/23/17 0812  BP: (!) 125/98 138/77 (!) 144/87 129/75  Pulse: 79 76 73 71  Resp: 20 (!) 21 19 20   Temp: 98.9 F (37.2 C) 100.1 F (37.8 C) 99.5 F (37.5 C) 98.9 F (37.2 C)  TempSrc: Oral Oral Oral Oral  SpO2: 94% 93% 97% 96%  Weight:  Height:        Intake/Output Summary (Last 24 hours) at 06/23/2017 1204 Last data filed at 06/23/2017 0958 Gross per 24 hour  Intake 930 ml  Output 2475 ml  Net -1545 ml   Filed Weights   06/21/17 0500 06/21/17 2107 06/22/17 0500  Weight: 128.4 kg (283 lb 1.1 oz) 128.4 kg (283 lb 1.1 oz) 128.4 kg (283 lb 1.1 oz)    Examination:  Gen: Awake, Alert, Oriented X 3, obese, chronically ill-appearing male, no  distress HEENT: PERRLA, Neck supple, + JVD Lungs: bibasilar crackles CVS: S1-S2/regular rate rhythm Abd: soft, Non tender, mildly distended, BS present Extremities: 1+ edema on the right, left leg is profoundly swollen with erythema, tenderness and warmth and a small ulcer at the anterior left shin Skin: as above Psychiatry: Judgement and insight appear normal. Mood & affect appropriate.     Data Reviewed:   CBC: Recent Labs  Lab 06/18/17 2151 06/20/17 1208 06/20/17 1709 06/21/17 0521 06/22/17 0525 06/23/17 0724  WBC 10.2 18.7* 14.1* 14.3* 13.4* 12.1*  NEUTROABS 7.0 17.2*  --   --   --   --   HGB 14.5 14.4 11.3* 14.0 12.9* 12.8*  HCT 42.6 42.5 34.6* 41.8 39.1 38.5*  MCV 86.6 86.6 87.6 87.3 86.9 87.1  PLT 233 207 159 179 177 818   Basic Metabolic Panel: Recent Labs  Lab 06/18/17 2151 06/20/17 1208 06/20/17 1709 06/21/17 0521 06/22/17 0525 06/23/17 0724  NA 141 135  --  135 134* 135  K 3.7 4.5  --  4.0 3.9 3.5  CL 102 98*  --  97* 101 98*  CO2 29 22  --  26 23 26   GLUCOSE 94 165*  --  128* 124* 68  BUN 14 26*  --  32* 28* 27*  CREATININE 1.31* 2.11* 1.72* 2.01* 1.53* 1.52*  CALCIUM 9.3 9.4  --  8.3* 7.8* 8.2*   GFR: Estimated Creatinine Clearance: 60.8 mL/min (A) (by C-G formula based on SCr of 1.52 mg/dL (H)). Liver Function Tests: Recent Labs  Lab 06/20/17 1208  AST 36  ALT 17  ALKPHOS 85  BILITOT 1.4*  PROT 7.1  ALBUMIN 3.7   No results for input(s): LIPASE, AMYLASE in the last 168 hours. No results for input(s): AMMONIA in the last 168 hours. Coagulation Profile: No results for input(s): INR, PROTIME in the last 168 hours. Cardiac Enzymes: No results for input(s): CKTOTAL, CKMB, CKMBINDEX, TROPONINI in the last 168 hours. BNP (last 3 results) Recent Labs    06/18/17 1001  PROBNP 274   HbA1C: No results for input(s): HGBA1C in the last 72 hours. CBG: Recent Labs  Lab 06/22/17 1631 06/22/17 2119 06/22/17 2159 06/23/17 0731 06/23/17 1152   GLUCAP 74 63* 119* 67 157*   Lipid Profile: No results for input(s): CHOL, HDL, LDLCALC, TRIG, CHOLHDL, LDLDIRECT in the last 72 hours. Thyroid Function Tests: No results for input(s): TSH, T4TOTAL, FREET4, T3FREE, THYROIDAB in the last 72 hours. Anemia Panel: No results for input(s): VITAMINB12, FOLATE, FERRITIN, TIBC, IRON, RETICCTPCT in the last 72 hours. Urine analysis:    Component Value Date/Time   COLORURINE AMBER (A) 06/20/2017 1649   APPEARANCEUR CLOUDY (A) 06/20/2017 1649   LABSPEC 1.031 (H) 06/20/2017 1649   PHURINE 5.0 06/20/2017 1649   GLUCOSEU 50 (A) 06/20/2017 1649   HGBUR MODERATE (A) 06/20/2017 1649   BILIRUBINUR SMALL (A) 06/20/2017 1649   KETONESUR NEGATIVE 06/20/2017 1649   PROTEINUR >=300 (A) 06/20/2017 1649   UROBILINOGEN 1.0  10/24/2011 1742   NITRITE NEGATIVE 06/20/2017 1649   LEUKOCYTESUR NEGATIVE 06/20/2017 1649   Sepsis Labs: @LABRCNTIP (procalcitonin:4,lacticidven:4)  ) Recent Results (from the past 240 hour(s))  Blood culture (routine x 2)     Status: Abnormal   Collection Time: 06/20/17 12:13 PM  Result Value Ref Range Status   Specimen Description BLOOD LEFT ANTECUBITAL  Final   Special Requests   Final    BOTTLES DRAWN AEROBIC AND ANAEROBIC Blood Culture adequate volume   Culture  Setup Time   Final    GRAM POSITIVE COCCI IN CHAINS IN BOTH AEROBIC AND ANAEROBIC BOTTLES CRITICAL VALUE NOTED.  VALUE IS CONSISTENT WITH PREVIOUSLY REPORTED AND CALLED VALUE.    Culture (A)  Final    GROUP A STREP (S.PYOGENES) ISOLATED SUSCEPTIBILITIES PERFORMED ON PREVIOUS CULTURE WITHIN THE LAST 5 DAYS. HEALTH DEPARTMENT NOTIFIED Performed at [facility], [place] [address] [place], [place] [address]    Report Status 06/23/2017 FINAL  Final  Blood culture (routine x 2)     Status: Abnormal   Collection Time: 06/20/17  1:04 PM  Result Value Ref Range Status   Specimen Description BLOOD RIGHT HAND  Final   Special Requests   Final    BOTTLES DRAWN  AEROBIC AND ANAEROBIC Blood Culture adequate volume   Culture  Setup Time   Final    GRAM POSITIVE COCCI IN CHAINS IN BOTH AEROBIC AND ANAEROBIC BOTTLES CRITICAL RESULT CALLED TO, READ BACK BY AND VERIFIED WITH: L SEAY,PHARMD AT 0707 06/21/17 BY L BENFIELD    Culture (A)  Final    STREPTOCOCCUS PYOGENES HEALTH DEPARTMENT NOTIFIED Performed at [facility], [place] [address], [place], [place] [address]    Report Status 06/23/2017 FINAL  Final   Organism ID, Bacteria STREPTOCOCCUS PYOGENES  Final      Susceptibility   Streptococcus pyogenes - MIC*    PENICILLIN <=0.06 SENSITIVE Sensitive     CEFTRIAXONE <=0.12 SENSITIVE Sensitive     ERYTHROMYCIN <=0.12 SENSITIVE Sensitive     LEVOFLOXACIN <=0.25 SENSITIVE Sensitive     VANCOMYCIN 0.5 SENSITIVE Sensitive     * STREPTOCOCCUS PYOGENES  Blood Culture ID Panel (Reflexed)     Status: Abnormal   Collection Time: 06/20/17  1:04 PM  Result Value Ref Range Status   Enterococcus species NOT DETECTED NOT DETECTED Final   Listeria monocytogenes NOT DETECTED NOT DETECTED Final   Staphylococcus species NOT DETECTED NOT DETECTED Final   Staphylococcus aureus NOT DETECTED NOT DETECTED Final   Streptococcus species DETECTED (A) NOT DETECTED Final    Comment: CRITICAL RESULT CALLED TO, READ BACK BY AND VERIFIED WITH: L SEAY,PHARMD AT 0707 06/21/17 BY L BENFIELD    Streptococcus agalactiae NOT DETECTED NOT DETECTED Final   Streptococcus pneumoniae NOT DETECTED NOT DETECTED Final   Streptococcus pyogenes DETECTED (A) NOT DETECTED Final    Comment: CRITICAL RESULT CALLED TO, READ BACK BY AND VERIFIED WITH: L SEAY,PHARMD AT 0707 06/21/17 BY L BENFIELD    Acinetobacter baumannii NOT DETECTED NOT DETECTED Final   Enterobacteriaceae species NOT DETECTED NOT DETECTED Final   Enterobacter cloacae complex NOT DETECTED NOT DETECTED Final   Escherichia coli NOT DETECTED NOT DETECTED Final   Klebsiella oxytoca NOT DETECTED NOT DETECTED Final   Klebsiella  pneumoniae NOT DETECTED NOT DETECTED Final   Proteus species NOT DETECTED NOT DETECTED Final   Serratia marcescens NOT DETECTED NOT DETECTED Final   Haemophilus influenzae NOT DETECTED NOT DETECTED Final   Neisseria meningitidis NOT DETECTED  NOT DETECTED Final   Pseudomonas aeruginosa NOT DETECTED NOT DETECTED Final   Candida albicans NOT DETECTED NOT DETECTED Final   Candida glabrata NOT DETECTED NOT DETECTED Final   Candida krusei NOT DETECTED NOT DETECTED Final   Candida parapsilosis NOT DETECTED NOT DETECTED Final   Candida tropicalis NOT DETECTED NOT DETECTED Final    Comment: Performed at [facility], [place] [address], [place], [place] [address]  Culture, blood (routine x 2)     Status: None (Preliminary result)   Collection Time: 06/21/17  9:50 AM  Result Value Ref Range Status   Specimen Description BLOOD LEFT ANTECUBITAL  Final   Special Requests   Final    BOTTLES DRAWN AEROBIC ONLY Blood Culture adequate volume   Culture   Final    NO GROWTH 1 DAY Performed at [facility], [place] [address], [place], [place] [address]    Report Status PENDING  Incomplete  Culture, blood (routine x 2)     Status: None (Preliminary result)   Collection Time: 06/21/17  9:55 AM  Result Value Ref Range Status   Specimen Description BLOOD RIGHT ANTECUBITAL  Final   Special Requests   Final    BOTTLES DRAWN AEROBIC ONLY Blood Culture adequate volume   Culture   Final    NO GROWTH 1 DAY Performed at [facility], [place] [address] [place],  [address]    Report Status PENDING  Incomplete         Radiology Studies: No results found.      Scheduled Meds: . aspirin EC  81 mg Oral Daily  . cholecalciferol  2,000 Units Oral Daily  . enoxaparin (LOVENOX) injection  60 mg Subcutaneous Q24H  . ezetimibe  10 mg Oral Daily  . furosemide  20 mg Intravenous Q12H  . insulin aspart  0-20 Units Subcutaneous TID WC  . insulin aspart  0-5 Units Subcutaneous QHS  . insulin  aspart protamine- aspart  35 Units Subcutaneous BID AC  . metoprolol succinate  100 mg Oral Daily  . multivitamin with minerals  1 tablet Oral Daily  . omega-3 acid ethyl esters  2 g Oral BID  . potassium chloride  40 mEq Oral Daily  . pramipexole  1.5 mg Oral BID  . pravastatin  40 mg Oral Daily  . sodium chloride flush  3 mL Intravenous Q12H   Continuous Infusions: . clindamycin (CLEOCIN) IV 900 mg (06/23/17 0536)  . penicillin G 2.5 - 3.5 MILLION UNITS IVPB Stopped (06/23/17 1057)     LOS: 3 days    Time spent: 13mn    PDomenic Polite MD [facility] Page via [email or web address], password [email or web address] After 7PM please contact night-coverage  06/23/2017, 12:04 PM

## 2017-06-24 ENCOUNTER — Ambulatory Visit (HOSPITAL_COMMUNITY): Payer: Medicare HMO

## 2017-06-24 ENCOUNTER — Other Ambulatory Visit (HOSPITAL_COMMUNITY): Payer: Medicare HMO

## 2017-06-24 ENCOUNTER — Inpatient Hospital Stay (HOSPITAL_COMMUNITY): Payer: Medicare HMO

## 2017-06-24 DIAGNOSIS — I4891 Unspecified atrial fibrillation: Secondary | ICD-10-CM

## 2017-06-24 DIAGNOSIS — Z6838 Body mass index (BMI) 38.0-38.9, adult: Secondary | ICD-10-CM

## 2017-06-24 DIAGNOSIS — Z8249 Family history of ischemic heart disease and other diseases of the circulatory system: Secondary | ICD-10-CM

## 2017-06-24 DIAGNOSIS — L03116 Cellulitis of left lower limb: Secondary | ICD-10-CM

## 2017-06-24 DIAGNOSIS — N183 Chronic kidney disease, stage 3 (moderate): Secondary | ICD-10-CM

## 2017-06-24 DIAGNOSIS — I10 Essential (primary) hypertension: Secondary | ICD-10-CM

## 2017-06-24 DIAGNOSIS — I129 Hypertensive chronic kidney disease with stage 1 through stage 4 chronic kidney disease, or unspecified chronic kidney disease: Secondary | ICD-10-CM

## 2017-06-24 DIAGNOSIS — A400 Sepsis due to streptococcus, group A: Principal | ICD-10-CM

## 2017-06-24 DIAGNOSIS — E1122 Type 2 diabetes mellitus with diabetic chronic kidney disease: Secondary | ICD-10-CM

## 2017-06-24 LAB — BASIC METABOLIC PANEL
Anion gap: 8 (ref 5–15)
BUN: 22 mg/dL — AB (ref 6–20)
CALCIUM: 8.2 mg/dL — AB (ref 8.9–10.3)
CO2: 29 mmol/L (ref 22–32)
CREATININE: 1.35 mg/dL — AB (ref 0.61–1.24)
Chloride: 98 mmol/L — ABNORMAL LOW (ref 101–111)
GFR, EST AFRICAN AMERICAN: 59 mL/min — AB (ref >60)
GFR, EST NON AFRICAN AMERICAN: 51 mL/min — AB (ref >60)
Glucose, Bld: 97 mg/dL (ref 65–99)
Potassium: 3.4 mmol/L — ABNORMAL LOW (ref 3.5–5.1)
SODIUM: 135 mmol/L (ref 135–145)

## 2017-06-24 LAB — CBC
HEMATOCRIT: 38.4 % — AB (ref 39.0–52.0)
Hemoglobin: 12.7 g/dL — ABNORMAL LOW (ref 13.0–17.0)
MCH: 28.7 pg (ref 26.0–34.0)
MCHC: 33.1 g/dL (ref 30.0–36.0)
MCV: 86.7 fL (ref 78.0–100.0)
PLATELETS: 223 10*3/uL (ref 150–400)
RBC: 4.43 MIL/uL (ref 4.22–5.81)
RDW: 13.6 % (ref 11.5–15.5)
WBC: 10.5 10*3/uL (ref 4.0–10.5)

## 2017-06-24 LAB — GLUCOSE, CAPILLARY
GLUCOSE-CAPILLARY: 89 mg/dL (ref 65–99)
GLUCOSE-CAPILLARY: 161 mg/dL — AB (ref 65–99)
GLUCOSE-CAPILLARY: 139 mg/dL — AB (ref 65–99)
Glucose-Capillary: 143 mg/dL — ABNORMAL HIGH (ref 65–99)

## 2017-06-24 LAB — ECHOCARDIOGRAM COMPLETE
Height: 72 in
WEIGHTICAEL: 4529.13 [oz_av]

## 2017-06-24 MED ORDER — PERFLUTREN LIPID MICROSPHERE
1.0000 mL | INTRAVENOUS | Status: AC | PRN
  Administered 2017-06-24: 2 mL via INTRAVENOUS
  Filled 2017-06-24: qty 10

## 2017-06-24 MED ORDER — FUROSEMIDE 10 MG/ML IJ SOLN: 40.0000 mg | Freq: Two times a day (BID) | INTRAMUSCULAR | Status: DC

## 2017-06-24 MED ORDER — FUROSEMIDE 10 MG/ML IJ SOLN
20.0000 mg | Freq: Two times a day (BID) | INTRAMUSCULAR | Status: DC
  Administered 2017-06-24: 20 mg via INTRAVENOUS
  Filled 2017-06-24: qty 2

## 2017-06-24 MED ORDER — FUROSEMIDE 10 MG/ML IJ SOLN
40.0000 mg | Freq: Two times a day (BID) | INTRAMUSCULAR | Status: DC
Start: 2017-06-25 — End: 2017-06-25
  Administered 2017-06-25: 40 mg via INTRAVENOUS
  Filled 2017-06-24: qty 4

## 2017-06-24 NOTE — Progress Notes (Signed)
  Echocardiogram 2D Echocardiogram has been performed.  James [name] 06/24/2017, 1:43 PM

## 2017-06-24 NOTE — Progress Notes (Signed)
PROGRESS NOTE        PATIENT DETAILS Name: James [name] Age: 72 y.o. Sex: male Date of Birth: Aug 14, 1945 Admit Date: 06/20/2017 Admitting Physician Lahoma Crocker, MD ZOX:[id number], Jeannett Senior, MD  Brief Narrative: Patient is a 73 y.o. male history of chronic venous stasis dermatitis in bilateral lower extremities, chronic diastolic heart failure, hypertension, DM-2-presented to the ED with sepsis and acute metabolic encephalopathy secondary to left leg cellulitis.  Subsequent blood culture positive for group A streptococci.  Slowly improving with supportive care.  See below for further details.  Subjective: Claims to feel better-inquiring about discharge home-however left leg still is significantly swollen-but per patient erythema and swelling have decreased somewhat.  Assessment/Plan: Sepsis secondary to cellulitis of the left lower extremity and group A streptococcal bacteremia: Sepsis pathophysiology has resolved-he continues to have significant amount of erythema/swelling in the lower extremity-but per patient-swelling is actually improved-erythema was up to his thighs (marked area) that have improved as well.  Continue penicillin G and clindamycin (started for concern for toxic shock syndrome)-lower extremity Doppler was negative.  Await transthoracic echocardiogram-thankfully repeat blood cultures on 5/5 are negative so far.  Have consulted infectious disease.    Acute metabolic encephalopathy: Secondary to above-resolved.  Acute kidney injury on chronic kidney disease stage III: Acute kidney injury likely hemodynamically mediated in the setting of sepsis.  Renal function has improved-creatinine is close to usual baseline.  Significant lower extremity edema-increase Lasix to 40 mg twice daily  Acute on chronic diastolic heart failure: Slowly improving-but still with significant edema in the lower extremities-increasing Lasix to 40 mg twice daily.  Follow weights,  intake output and electrolytes closely while on Lasix.  Insulin-dependent DM-2: CBG stable with insulin 70/30 20 units twice daily.  Follow and adjust accordingly.  Continue to hold all oral hypoglycemic agents.  CAD: No anginal symptoms-continued aspirin blocker.  Chronic venous stasis dermatitis bilateral lower extremity  DVT Prophylaxis: Prophylactic Lovenox   Code Status: Full code   Family Communication: None at bedside  Disposition Plan: Remain inpatient-home on discharge over the next few days.  Antimicrobial agents: Anti-infectives (From admission, onward)   Start     Dose/Rate Route Frequency Ordered Stop   06/23/17 0800  penicillin G potassium 3 Million Units in dextrose 50mL IVPB  Status:  Discontinued     3 Million Units 100 mL/hr over 30 Minutes Intravenous Every 4 hours 06/22/17 1144 06/22/17 1912   06/23/17 0800  penicillin G potassium 3 Million Units in dextrose 50mL IVPB  Status:  Discontinued     3 Million Units 100 mL/hr over 30 Minutes Intravenous Every 4 hours 06/22/17 1912 06/22/17 1914   06/23/17 0800  penicillin G potassium 3 Million Units in dextrose 5 % 100 mL IVPB     3 Million Units 200 mL/hr over 30 Minutes Intravenous Every 4 hours 06/22/17 1914     06/22/17 1400  clindamycin (CLEOCIN) IVPB 900 mg     900 mg 100 mL/hr over 30 Minutes Intravenous Every 8 hours 06/22/17 1144 06/25/17 1359   06/21/17 0800  cefTRIAXone (ROCEPHIN) 2 g in sodium chloride 0.9 % 100 mL IVPB  Status:  Discontinued     2 g 200 mL/hr over 30 Minutes Intravenous Every 24 hours 06/21/17 0722 06/22/17 1144   06/21/17 0100  vancomycin (VANCOCIN) IVPB 1000 mg/200 mL premix  Status:  Discontinued     1,000 mg 200 mL/hr over 60 Minutes Intravenous Every 12 hours 06/20/17 1215 06/21/17 0721   06/20/17 2100  piperacillin-tazobactam (ZOSYN) IVPB 3.375 g  Status:  Discontinued     3.375 g 12.5 mL/hr over 240 Minutes Intravenous Every 8 hours 06/20/17 1215 06/21/17 0721   06/20/17  1215  piperacillin-tazobactam (ZOSYN) IVPB 3.375 g     3.375 g 100 mL/hr over 30 Minutes Intravenous  Once 06/20/17 1205 06/20/17 1325   06/20/17 1215  vancomycin (VANCOCIN) IVPB 1000 mg/200 mL premix     1,000 mg 200 mL/hr over 60 Minutes Intravenous  Once 06/20/17 1205 06/20/17 1501      Procedures: None  CONSULTS:  ID  Time spent: 25- minutes-Greater than 50% of this time was spent in counseling, explanation of diagnosis, planning of further management, and coordination of care.  MEDICATIONS: Scheduled Meds: . aspirin EC  81 mg Oral Daily  . cholecalciferol  2,000 Units Oral Daily  . enoxaparin (LOVENOX) injection  60 mg Subcutaneous Q24H  . ezetimibe  10 mg Oral Daily  . furosemide  20 mg Intravenous Q12H  . insulin aspart  0-20 Units Subcutaneous TID WC  . insulin aspart  0-5 Units Subcutaneous QHS  . insulin aspart protamine- aspart  20 Units Subcutaneous BID AC  . metoprolol succinate  100 mg Oral Daily  . multivitamin with minerals  1 tablet Oral Daily  . omega-3 acid ethyl esters  2 g Oral BID  . potassium chloride  40 mEq Oral Daily  . pramipexole  1.5 mg Oral BID  . pravastatin  40 mg Oral Daily  . sodium chloride flush  3 mL Intravenous Q12H   Continuous Infusions: . clindamycin (CLEOCIN) IV Stopped (06/24/17 1351)  . penicillin G 2.5 - 3.5 MILLION UNITS IVPB 3 Million Units (06/24/17 1352)   PRN Meds:.acetaminophen **OR** acetaminophen, magnesium citrate, ondansetron **OR** ondansetron (ZOFRAN) IV, oxyCODONE   PHYSICAL EXAM: Vital signs: Vitals:   06/23/17 0812 06/23/17 1525 06/23/17 2057 06/24/17 0455  BP: 129/75 (!) 146/76 (!) 148/77 (!) 150/82  Pulse: 71 72 78 68  Resp: Temp: 98.9 F (37.2 C) 99.3 F (37.4 C) 98.8 F (37.1 C) 98.5 F (36.9 C)  TempSrc: Oral Oral Oral Oral  SpO2: 96% 97% 97% 99%  Weight:      Height:       Filed Weights   06/21/17 0500 06/21/17 2107 06/22/17 0500  Weight: 128.4 kg (283 lb 1.1 oz) 128.4 kg (283  lb 1.1 oz) 128.4 kg (283 lb 1.1 oz)   Body mass index is 38.39 kg/m.   General appearance :Awake, alert, not in any distress.  Eyes:, pupils equally reactive to light and accomodation,no scleral icterus.Pink conjunctiva HEENT: Atraumatic and Normocephalic Neck: supple, no JVD. No cervical lymphadenopathy. No thyromegaly Resp:Good air entry bilaterally, no added sounds  CVS: S1 S2 regular, no murmurs.  GI: Bowel sounds present, Non tender and not distended with no gaurding, rigidity or rebound.No organomegaly Extremities:  Left lower extremity: Still swollen-but not tense-sensation is intact-able to plantar and dorsiflex ankle.  Has a small bulla in the dorsal aspect of the calf.  Erythema from the knee down onto the dorsum of the foot.  No erythema seen in the marked area in the left eye. Right lower extremity: Evidence of chronic venous stasis dermatitis-1+ edema. Neurology:  speech clear,Non focal, sensation is grossly intact. Psychiatric: Normal judgment and insight. Alert and oriented x 3. Normal mood.  Musculoskeletal:No digital cyanosis Skin:No Rash, warm and dry Wounds:N/A  I have personally reviewed following labs and imaging studies  LABORATORY DATA: CBC: Recent Labs  Lab 06/18/17 2151 06/20/17 1208 06/20/17 1709 06/21/17 0521 06/22/17 0525 06/23/17 0724 06/24/17 0359  WBC 10.2 18.7* 14.1* 14.3* 13.4* 12.1* 10.5  NEUTROABS 7.0 17.2*  --   --   --   --   --   HGB 14.5 14.4 11.3* 14.0 12.9* 12.8* 12.7*  HCT 42.6 42.5 34.6* 41.8 39.1 38.5* 38.4*  MCV 86.6 86.6 87.6 87.3 86.9 87.1 86.7  PLT 233 207 159 179 177 197 223    Basic Metabolic Panel: Recent Labs  Lab 06/20/17 1208 06/20/17 1709 06/21/17 0521 06/22/17 0525 06/23/17 0724 06/24/17 0359  NA 135  --  135 134* 135 135  K 4.5  --  4.0 3.9 3.5 3.4*  CL 98*  --  97* 101 98* 98*  CO2 22  --  GLUCOSE 165*  --  128* 124* 68 97  BUN 26*  --  32* 28* 27* 22*  CREATININE 2.11* 1.72* 2.01* 1.53*  1.52* 1.35*  CALCIUM 9.4  --  8.3* 7.8* 8.2* 8.2*    GFR: Estimated Creatinine Clearance: 68.5 mL/min (A) (by C-G formula based on SCr of 1.35 mg/dL (H)).  Liver Function Tests: Recent Labs  Lab 06/20/17 1208  AST 36  ALT 17  ALKPHOS 85  BILITOT 1.4*  PROT 7.1  ALBUMIN 3.7   No results for input(s): LIPASE, AMYLASE in the last 168 hours. No results for input(s): AMMONIA in the last 168 hours.  Coagulation Profile: No results for input(s): INR, PROTIME in the last 168 hours.  Cardiac Enzymes: No results for input(s): CKTOTAL, CKMB, CKMBINDEX, TROPONINI in the last 168 hours.  BNP (last 3 results) Recent Labs    06/18/17 1001  PROBNP 274    HbA1C: No results for input(s): HGBA1C in the last 72 hours.  CBG: Recent Labs  Lab 06/23/17 1152 06/23/17 1651 06/23/17 2057 06/24/17 0738 06/24/17 1339  GLUCAP 157* 178* 214* 89 143*    Lipid Profile: No results for input(s): CHOL, HDL, LDLCALC, TRIG, CHOLHDL, LDLDIRECT in the last 72 hours.  Thyroid Function Tests: No results for input(s): TSH, T4TOTAL, FREET4, T3FREE, THYROIDAB in the last 72 hours.  Anemia Panel: No results for input(s): VITAMINB12, FOLATE, FERRITIN, TIBC, IRON, RETICCTPCT in the last 72 hours.  Urine analysis:    Component Value Date/Time   COLORURINE AMBER (A) 06/20/2017 1649   APPEARANCEUR CLOUDY (A) 06/20/2017 1649   LABSPEC 1.031 (H) 06/20/2017 1649   PHURINE 5.0 06/20/2017 1649   GLUCOSEU 50 (A) 06/20/2017 1649   HGBUR MODERATE (A) 06/20/2017 1649   BILIRUBINUR SMALL (A) 06/20/2017 1649   KETONESUR NEGATIVE 06/20/2017 1649   PROTEINUR >=300 (A) 06/20/2017 1649   UROBILINOGEN 1.0 10/24/2011 1742   NITRITE NEGATIVE 06/20/2017 1649   LEUKOCYTESUR NEGATIVE 06/20/2017 1649    Sepsis Labs: Lactic Acid, Venous    Component Value Date/Time   LATICACIDVEN 3.71 (HH) 06/20/2017 1546    MICROBIOLOGY: Recent Results (from the past 240 hour(s))  Blood culture (routine x 2)     Status:  Abnormal (Preliminary result)   Collection Time: 06/20/17 12:13 PM  Result Value Ref Range Status   Specimen Description BLOOD LEFT ANTECUBITAL  Final   Special Requests   Final    BOTTLES DRAWN AEROBIC AND ANAEROBIC Blood Culture adequate volume   Culture  Setup Time   Final  GRAM POSITIVE COCCI IN CHAINS IN BOTH AEROBIC AND ANAEROBIC BOTTLES CRITICAL VALUE NOTED.  VALUE IS CONSISTENT WITH PREVIOUSLY REPORTED AND CALLED VALUE.    Culture (A)  Final    GROUP A STREP (S.PYOGENES) ISOLATED SUSCEPTIBILITIES PERFORMED ON PREVIOUS CULTURE WITHIN THE LAST 5 DAYS. HEALTH DEPARTMENT NOTIFIED SENT TO SATE LAB FOR SEROTYPING Performed at [facility], [address] [place], [place] [address]    Report Status PENDING  Incomplete  Blood culture (routine x 2)     Status: Abnormal   Collection Time: 06/20/17  1:04 PM  Result Value Ref Range Status   Specimen Description BLOOD RIGHT HAND  Final   Special Requests   Final    BOTTLES DRAWN AEROBIC AND ANAEROBIC Blood Culture adequate volume   Culture  Setup Time   Final    GRAM POSITIVE COCCI IN CHAINS IN BOTH AEROBIC AND ANAEROBIC BOTTLES CRITICAL RESULT CALLED TO, READ BACK BY AND VERIFIED WITH: L SEAY,PHARMD AT 0707 06/21/17 BY L BENFIELD    Culture (A)  Final    STREPTOCOCCUS PYOGENES HEALTH DEPARTMENT NOTIFIED Performed at [facility], [address] [place], [place] [address]    Report Status 06/23/2017 FINAL  Final   Organism ID, Bacteria STREPTOCOCCUS PYOGENES  Final      Susceptibility   Streptococcus pyogenes - MIC*    PENICILLIN <=0.06 SENSITIVE Sensitive     CEFTRIAXONE <=0.12 SENSITIVE Sensitive     ERYTHROMYCIN <=0.12 SENSITIVE Sensitive     LEVOFLOXACIN <=0.25 SENSITIVE Sensitive     VANCOMYCIN 0.5 SENSITIVE Sensitive     * STREPTOCOCCUS PYOGENES  Blood Culture ID Panel (Reflexed)     Status: Abnormal   Collection Time: 06/20/17  1:04 PM  Result Value Ref Range Status   Enterococcus species NOT DETECTED  NOT DETECTED Final   Listeria monocytogenes NOT DETECTED NOT DETECTED Final   Staphylococcus species NOT DETECTED NOT DETECTED Final   Staphylococcus aureus NOT DETECTED NOT DETECTED Final   Streptococcus species DETECTED (A) NOT DETECTED Final    Comment: CRITICAL RESULT CALLED TO, READ BACK BY AND VERIFIED WITH: L SEAY,PHARMD AT 0707 06/21/17 BY L BENFIELD    Streptococcus agalactiae NOT DETECTED NOT DETECTED Final   Streptococcus pneumoniae NOT DETECTED NOT DETECTED Final   Streptococcus pyogenes DETECTED (A) NOT DETECTED Final    Comment: CRITICAL RESULT CALLED TO, READ BACK BY AND VERIFIED WITH: L SEAY,PHARMD AT 0707 06/21/17 BY L BENFIELD    Acinetobacter baumannii NOT DETECTED NOT DETECTED Final   Enterobacteriaceae species NOT DETECTED NOT DETECTED Final   Enterobacter cloacae complex NOT DETECTED NOT DETECTED Final   Escherichia coli NOT DETECTED NOT DETECTED Final   Klebsiella oxytoca NOT DETECTED NOT DETECTED Final   Klebsiella pneumoniae NOT DETECTED NOT DETECTED Final   Proteus species NOT DETECTED NOT DETECTED Final   Serratia marcescens NOT DETECTED NOT DETECTED Final   Haemophilus influenzae NOT DETECTED NOT DETECTED Final   Neisseria meningitidis NOT DETECTED NOT DETECTED Final   Pseudomonas aeruginosa NOT DETECTED NOT DETECTED Final   Candida albicans NOT DETECTED NOT DETECTED Final   Candida glabrata NOT DETECTED NOT DETECTED Final   Candida krusei NOT DETECTED NOT DETECTED Final   Candida parapsilosis NOT DETECTED NOT DETECTED Final   Candida tropicalis NOT DETECTED NOT DETECTED Final    Comment: Performed at [facility], [address] [place], [place] [address]  Culture, blood (routine x 2)     Status: None (Preliminary result)  Collection Time: 06/21/17  9:50 AM  Result Value Ref Range Status   Specimen Description BLOOD LEFT ANTECUBITAL  Final   Special Requests   Final    BOTTLES DRAWN AEROBIC ONLY Blood Culture adequate volume   Culture   Final     NO GROWTH 3 DAYS Performed at [facility], [address] [place], [place] [address]    Report Status PENDING  Incomplete  Culture, blood (routine x 2)     Status: None (Preliminary result)   Collection Time: 06/21/17  9:55 AM  Result Value Ref Range Status   Specimen Description BLOOD RIGHT ANTECUBITAL  Final   Special Requests   Final    BOTTLES DRAWN AEROBIC ONLY Blood Culture adequate volume   Culture   Final    NO GROWTH 3 DAYS Performed at [facility], [address], [place], [place] [address]    Report Status PENDING  Incomplete    RADIOLOGY STUDIES/RESULTS: Ct Angio Chest Pe W And/or Wo Contrast  Result Date: 06/18/2017 CLINICAL DATA:  Shortness of breath, lower extremity edema, elevated D-dimer EXAM: CT ANGIOGRAPHY CHEST WITH CONTRAST TECHNIQUE: Multidetector CT imaging of the chest was performed using the standard protocol during bolus administration of intravenous contrast. Multiplanar CT image reconstructions and MIPs were obtained to evaluate the vascular anatomy. CONTRAST:  67mL ISOVUE-370 IOPAMIDOL (ISOVUE-370) INJECTION 76% COMPARISON:  Chest radiographs dated 12/08/2013 FINDINGS: Cardiovascular: Satisfactory opacification of the bilateral pulmonary arteries to the segmental level. No evidence of pulmonary embolism. No evidence of thoracic aortic aneurysm. Atherosclerotic calcifications of the aortic arch. Cardiomegaly.  No pericardial effusion. Three vessel atherosclerosis. Postsurgical changes related to prior CABG. Mediastinum/Nodes: No suspicious mediastinal lymphadenopathy. Visualized thyroid is unremarkable. Lungs/Pleura: Mild linear scarring/atelectasis in the left upper lobe. Masslike rounded opacity at the left lung base (series 6/image 122), likely reflecting rounded atelectasis, although technically indeterminate. Mild dependent atelectasis in the right lower lobe. Mild ground-glass opacity/mosaic attenuation in the lungs bilaterally. 3 mm subpleural  nodule in the right upper lobe (series 6/image 45), likely benign. No pleural effusion or pneumothorax. Upper Abdomen: Visualized upper abdomen is grossly unremarkable. Musculoskeletal: Mild degenerative changes of the visualized thoracolumbar spine. Median sternotomy. Review of the MIP images confirms the above findings. IMPRESSION: No evidence of pulmonary embolism. Masslike rounded opacity in the left lung base, likely reflecting rounded atelectasis, although technically indeterminate. Consider follow-up CT chest in 3 months. 3 mm subpleural nodule in the right upper lobe, likely benign. No follow-up needed if patient is low-risk. Non-contrast chest CT can be considered in 12 months if patient is high-risk. This recommendation follows the consensus statement: Guidelines for Management of Incidental Pulmonary Nodules Detected on CT Images: From the Fleischner Society 2017; Radiology 2017; 284:228-243. Aortic Atherosclerosis (ICD10-I70.0). Electronically Signed   By: Charline Bills M.D.   On: 06/18/2017 23:06   Dg Abdomen Acute W/chest  Result Date: 06/20/2017 CLINICAL DATA:  72 year old male with a history of abdominal distention and shortness of breath EXAM: DG ABDOMEN ACUTE W/ 1V CHEST COMPARISON:  CT 06/18/2017, no prior abdominal CT FINDINGS: Chest: Cardiomediastinal silhouette persistently enlarged. Surgical changes of median sternotomy and CABG. Low lung volumes with coarsened interstitial markings. Abdomen: Gas within stomach, small bowel, colon. No abnormal distention. Large stool burden. Gas extends to the rectum. No radiopaque foreign body. No unexpected calcification or soft tissue density. IMPRESSION: Chest: Low lung volumes with no evidence of lobar pneumonia. Surgical changes of median sternotomy and CABG. Abdomen: Nonobstructive bowel gas pattern.  Large stool burden, potentially representing constipation. Electronically Signed   By: Gilmer Mor D.O.   On: 06/20/2017 12:41     LOS: 4 days     Jeoffrey Massed, MD  [facility] Pager:[phone]  If 7PM-7AM, please contact night-coverage [email or web address] Password [email or web address] 06/24/2017, 2:10 PM

## 2017-06-24 NOTE — Consult Note (Signed)
[facility] for Infectious Disease    Date of Admission:  06/20/2017   Total days of antibiotics: 4 vanco/zosyn--> ceftriaxone-->           clinda/Pen               Reason for Consult: Cellulilitis    Referring Provider: Ghimire   Assessment: LLE cellulitis Strep pyogenes sepsis DM2 CKD 3 Morbid obesity (BMI 38)   Plan: 1. Stop clinda (has gotten 3 days, toxin production should be shut down. associated with C diff) 2. Consider PEN alone or ceftriaxone 3. Keep legs elevated 4. Cr improving.  5. Control Glc as best as possible (variable) 6. Watch repeat BCx (5-5). Ngtd.  7. WOC eval for wrapping of LE to decrease edema, assist in healing of wounds.   Thank you so much for this interesting consult,  Principal Problem:   Sepsis ([place]) Active Problems:   Hypertension   Coronary artery disease   Hyperlipidemia   DM (diabetes mellitus), type 2, uncontrolled with complications (HCC)   GERD (gastroesophageal reflux disease)   Chronic diastolic heart failure (HCC)   Cellulitis of left lower extremity   . aspirin EC  81 mg Oral Daily  . cholecalciferol  2,000 Units Oral Daily  . enoxaparin (LOVENOX) injection  60 mg Subcutaneous Q24H  . ezetimibe  10 mg Oral Daily  . furosemide  40 mg Intravenous Q12H  . insulin aspart  0-20 Units Subcutaneous TID WC  . insulin aspart  0-5 Units Subcutaneous QHS  . insulin aspart protamine- aspart  20 Units Subcutaneous BID AC  . metoprolol succinate  100 mg Oral Daily  . multivitamin with minerals  1 tablet Oral Daily  . omega-3 acid ethyl esters  2 g Oral BID  . potassium chloride  40 mEq Oral Daily  . pramipexole  1.5 mg Oral BID  . pravastatin  40 mg Oral Daily  . sodium chloride flush  3 mL Intravenous Q12H    HPI: James [name] is a 72 y.o. male with hx of DM2 (x 10 yrs), HTN, brought to ED on 5-4 with mental status change. He was also noted to have rigors. In ED he had temp 102.7, tachycardia, tachypnea and Lactic  acid 6.5.  He was also noted to have significant increase in edema in his LLE.  He was fluid resuscitated and started on vanco/zosyn. His u/s was (-) for DVT.  He was found to have BCx+ for Group A strep. He was dx with LLE cellulitis. He was changed to ceftriaxone on 06-21-17. On 5-6 he was changed to PEN-clinda as recommended by pharmacy.  He has improved, slowly in hospital.   He has had TTE which was technically difficult study. No mention of vegetation.   Review of Systems: Review of Systems  Constitutional: Positive for chills and fever.  Eyes: Negative for blurred vision.  Cardiovascular: Positive for leg swelling.  Gastrointestinal: Negative for constipation, diarrhea, nausea and vomiting.  Genitourinary: Negative for dysuria.  Skin: Positive for rash.  Neurological: Positive for sensory change.  Please see HPI. All other systems reviewed and negative. He has not had ophtho this year.  Has had to ration insulin due to cost.   Past Medical History:  Diagnosis Date  . Ascending aortic aneurysm ([place])    cMRI (2/15): Normal LV EF 54%, Mild BAE, Trileaflet Aortic Valve, Upper limits of normal ascending aorta 3.8 cm, Normal aortic arch 2.3 cm with bovine  origin of left carotid  . Atrial flutter (HCC)    Post-op with no reoccurence  . Chronic diastolic heart failure ([place])    Echo (1/15): Left ventricle: The cavity size was mildly dilated. Mild LVH. EF 55% to 60%. Wall motion was normal; Gr 1 diastolic dysfunction Left atrium: The atrium was mildly dilated. Right atrium: The atrium was mildly to moderately dilated. Aortic arch measures 4.8 cm (moderately dilated); suggest CTA or MRA to better assess.  . Coronary artery disease 10/2011   a. s/p cabg;  b. Myoview (9/15):  ant ischemia, poss TID, EF 43%, high risk >> LHC (9/15): Dist LM 50, pLAD 90 then 100, pCFX 50, mCFX 75, oRCA 75, dRCA 95, L-LAD patent, S-Dx 100, S-OM1 patent, S-PDA patent, EF 55% >> native Dx small caliber and not  well suited for PCI; native RCA amenable to PCI and would restore flow to PLA branches but would jeopardize SVG-RCA - Med Rx  . Deviated nasal septum   . Diabetes mellitus    onset 2013  . Dyslipidemia   . GERD (gastroesophageal reflux disease)   . Hx of Doppler ultrasound    Carotid US (9/13): No ICA stenosis  . Hypertension     Social History   Tobacco Use  . Smoking status: Never Smoker  . Smokeless tobacco: Never Used  Substance Use Topics  . Alcohol use: Yes    Comment: 1 beer a month or less  . Drug use: No    Family History  Problem Relation Age of Onset  . Hypertension Mother   . Stroke Father   . Hypertension Father   . Hypertension Paternal Grandfather   . Heart attack Neg Hx      Medications:  Scheduled: . aspirin EC  81 mg Oral Daily  . cholecalciferol  2,000 Units Oral Daily  . enoxaparin (LOVENOX) injection  60 mg Subcutaneous Q24H  . ezetimibe  10 mg Oral Daily  . furosemide  40 mg Intravenous Q12H  . insulin aspart  0-20 Units Subcutaneous TID WC  . insulin aspart  0-5 Units Subcutaneous QHS  . insulin aspart protamine- aspart  20 Units Subcutaneous BID AC  . metoprolol succinate  100 mg Oral Daily  . multivitamin with minerals  1 tablet Oral Daily  . omega-3 acid ethyl esters  2 g Oral BID  . potassium chloride  40 mEq Oral Daily  . pramipexole  1.5 mg Oral BID  . pravastatin  40 mg Oral Daily  . sodium chloride flush  3 mL Intravenous Q12H    Abtx:  Anti-infectives (From admission, onward)   Start     Dose/Rate Route Frequency Ordered Stop   06/23/17 0800  penicillin G potassium 3 Million Units in dextrose 30m IVPB  Status:  Discontinued     3 Million Units 100 mL/hr over 30 Minutes Intravenous Every 4 hours 06/22/17 1144 06/22/17 1912   06/23/17 0800  penicillin G potassium 3 Million Units in dextrose 541mIVPB  Status:  Discontinued     3 Million Units 100 mL/hr over 30 Minutes Intravenous Every 4 hours 06/22/17 1912 06/22/17 1914    06/23/17 0800  penicillin G potassium 3 Million Units in dextrose 5 % 100 mL IVPB     3 Million Units 200 mL/hr over 30 Minutes Intravenous Every 4 hours 06/22/17 1914     06/22/17 1400  clindamycin (CLEOCIN) IVPB 900 mg     900 mg 100 mL/hr over 30 Minutes Intravenous Every 8 hours  06/22/17 1144 06/25/17 1359   06/21/17 0800  cefTRIAXone (ROCEPHIN) 2 g in sodium chloride 0.9 % 100 mL IVPB  Status:  Discontinued     2 g 200 mL/hr over 30 Minutes Intravenous Every 24 hours 06/21/17 0722 06/22/17 1144   06/21/17 0100  vancomycin (VANCOCIN) IVPB 1000 mg/200 mL premix  Status:  Discontinued     1,000 mg 200 mL/hr over 60 Minutes Intravenous Every 12 hours 06/20/17 1215 06/21/17 0721   06/20/17 2100  piperacillin-tazobactam (ZOSYN) IVPB 3.375 g  Status:  Discontinued     3.375 g 12.5 mL/hr over 240 Minutes Intravenous Every 8 hours 06/20/17 1215 06/21/17 0721   06/20/17 1215  piperacillin-tazobactam (ZOSYN) IVPB 3.375 g     3.375 g 100 mL/hr over 30 Minutes Intravenous  Once 06/20/17 1205 06/20/17 1325   06/20/17 1215  vancomycin (VANCOCIN) IVPB 1000 mg/200 mL premix     1,000 mg 200 mL/hr over 60 Minutes Intravenous  Once 06/20/17 1205 06/20/17 1501        OBJECTIVE: Blood pressure (!) 150/82, pulse 68, temperature 98.5 F (36.9 C), temperature source Oral, resp. rate 20, height 6' (1.829 m), weight 128.4 kg (283 lb 1.1 oz), SpO2 99 %.  Physical Exam  Constitutional: He is oriented to person, place, and time. He appears well-developed and well-nourished.  HENT:  Mouth/Throat: No oropharyngeal exudate.  Eyes: Pupils are equal, round, and reactive to light. EOM are normal.  Neck: Normal range of motion. Neck supple.  Cardiovascular: Normal rate, regular rhythm and normal heart sounds.  Pulmonary/Chest: Effort normal and breath sounds normal.  Abdominal: Soft. Bowel sounds are normal. There is no tenderness.  Neurological: He is alert and oriented to person, place, and time. No  sensory deficit.  Skin:       Lab Results Results for orders placed or performed during the hospital encounter of 06/20/17 (from the past 48 hour(s))  Glucose, capillary     Status: None   Collection Time: 06/22/17  4:31 PM  Result Value Ref Range   Glucose-Capillary 74 65 - 99 mg/dL  Glucose, capillary     Status: Abnormal   Collection Time: 06/22/17  9:19 PM  Result Value Ref Range   Glucose-Capillary 63 (L) 65 - 99 mg/dL  Glucose, capillary     Status: Abnormal   Collection Time: 06/22/17  9:59 PM  Result Value Ref Range   Glucose-Capillary 119 (H) 65 - 99 mg/dL  CBC     Status: Abnormal   Collection Time: 06/23/17  7:24 AM  Result Value Ref Range   WBC 12.1 (H) 4.0 - 10.5 K/uL   RBC 4.42 4.22 - 5.81 MIL/uL   Hemoglobin 12.8 (L) 13.0 - 17.0 g/dL   HCT 38.5 (L) 39.0 - 52.0 %   MCV 87.1 78.0 - 100.0 fL   MCH 29.0 26.0 - 34.0 pg   MCHC 33.2 30.0 - 36.0 g/dL   RDW 13.6 11.5 - 15.5 %   Platelets 197 150 - 400 K/uL    Comment: Performed at [facility], [place] [address] [place], [place] [address]  Basic metabolic panel     Status: Abnormal   Collection Time: 06/23/17  7:24 AM  Result Value Ref Range   Sodium 135 135 - 145 mmol/L   Potassium 3.5 3.5 - 5.1 mmol/L   Chloride 98 (L) 101 - 111 mmol/L   CO2 26 22 - 32 mmol/L   Glucose, Bld 68 65 - 99 mg/dL   BUN  27 (H) 6 - 20 mg/dL   Creatinine, Ser 1.52 (H) 0.61 - 1.24 mg/dL   Calcium 8.2 (L) 8.9 - 10.3 mg/dL   GFR calc non Af Amer 44 (L) >60 mL/min   GFR calc Af Amer 51 (L) >60 mL/min    Comment: (NOTE) The eGFR has been calculated using the CKD EPI equation. This calculation has not been validated in all clinical situations. eGFR's persistently <60 mL/min signify possible Chronic Kidney Disease.    Anion gap 11 5 - 15    Comment: Performed at [facility] [address] [place], [place] [address]  Glucose, capillary     Status: None   Collection Time: 06/23/17  7:31 AM  Result Value Ref Range    Glucose-Capillary 67 65 - 99 mg/dL  Glucose, capillary     Status: Abnormal   Collection Time: 06/23/17 11:52 AM  Result Value Ref Range   Glucose-Capillary 157 (H) 65 - 99 mg/dL  Glucose, capillary     Status: Abnormal   Collection Time: 06/23/17  4:51 PM  Result Value Ref Range   Glucose-Capillary 178 (H) 65 - 99 mg/dL  Glucose, capillary     Status: Abnormal   Collection Time: 06/23/17  8:57 PM  Result Value Ref Range   Glucose-Capillary 214 (H) 65 - 99 mg/dL  CBC     Status: Abnormal   Collection Time: 06/24/17  3:59 AM  Result Value Ref Range   WBC 10.5 4.0 - 10.5 K/uL   RBC 4.43 4.22 - 5.81 MIL/uL   Hemoglobin 12.7 (L) 13.0 - 17.0 g/dL   HCT 38.4 (L) 39.0 - 52.0 %   MCV 86.7 78.0 - 100.0 fL   MCH 28.7 26.0 - 34.0 pg   MCHC 33.1 30.0 - 36.0 g/dL   RDW 13.6 11.5 - 15.5 %   Platelets 223 150 - 400 K/uL    Comment: Performed at [facility], [place]. [address], [place], [place] [address]  Basic metabolic panel     Status: Abnormal   Collection Time: 06/24/17  3:59 AM  Result Value Ref Range   Sodium 135 135 - 145 mmol/L   Potassium 3.4 (L) 3.5 - 5.1 mmol/L   Chloride 98 (L) 101 - 111 mmol/L   CO2 29 22 - 32 mmol/L   Glucose, Bld 97 65 - 99 mg/dL   BUN 22 (H) 6 - 20 mg/dL   Creatinine, Ser 1.35 (H) 0.61 - 1.24 mg/dL   Calcium 8.2 (L) 8.9 - 10.3 mg/dL   GFR calc non Af Amer 51 (L) >60 mL/min   GFR calc Af Amer 59 (L) >60 mL/min    Comment: (NOTE) The eGFR has been calculated using the CKD EPI equation. This calculation has not been validated in all clinical situations. eGFR's persistently <60 mL/min signify possible Chronic Kidney Disease.    Anion gap 8 5 - 15    Comment: Performed at [facility] [address], [place], [place] [address]  Glucose, capillary     Status: None   Collection Time: 06/24/17  7:38 AM  Result Value Ref Range   Glucose-Capillary 89 65 - 99 mg/dL  Glucose, capillary     Status: Abnormal   Collection Time: 06/24/17  1:39 PM    Result Value Ref Range   Glucose-Capillary 143 (H) 65 - 99 mg/dL      Component Value Date/Time   SDES BLOOD RIGHT ANTECUBITAL 06/21/2017 0955   SPECREQUEST  06/21/2017  [place] Blood Culture adequate volume   CULT  06/21/2017 0955    NO GROWTH 3 DAYS Performed at [facility], [place] [address], [place], [place] [address]    REPTSTATUS PENDING 06/21/2017 0955   No results found. Recent Results (from the past 240 hour(s))  Blood culture (routine x 2)     Status: Abnormal (Preliminary result)   Collection Time: 06/20/17 12:13 PM  Result Value Ref Range Status   Specimen Description BLOOD LEFT ANTECUBITAL  Final   Special Requests   Final    BOTTLES DRAWN AEROBIC AND ANAEROBIC Blood Culture adequate volume   Culture  Setup Time   Final    GRAM POSITIVE COCCI IN CHAINS IN BOTH AEROBIC AND ANAEROBIC BOTTLES CRITICAL VALUE NOTED.  VALUE IS CONSISTENT WITH PREVIOUSLY REPORTED AND CALLED VALUE.    Culture (A)  Final    GROUP A STREP (S.PYOGENES) ISOLATED SUSCEPTIBILITIES PERFORMED ON PREVIOUS CULTURE WITHIN THE LAST 5 DAYS. HEALTH DEPARTMENT NOTIFIED SENT TO SATE LAB FOR SEROTYPING Performed at [facility], [place] [address], [place], [place] [address]    Report Status PENDING  Incomplete  Blood culture (routine x 2)     Status: Abnormal   Collection Time: 06/20/17  1:04 PM  Result Value Ref Range Status   Specimen Description BLOOD RIGHT HAND  Final   Special Requests   Final    BOTTLES DRAWN AEROBIC AND ANAEROBIC Blood Culture adequate volume   Culture  Setup Time   Final    GRAM POSITIVE COCCI IN CHAINS IN BOTH AEROBIC AND ANAEROBIC BOTTLES CRITICAL RESULT CALLED TO, READ BACK BY AND VERIFIED WITH: L SEAY,PHARMD AT 0707 06/21/17 BY L BENFIELD    Culture (A)  Final    STREPTOCOCCUS PYOGENES HEALTH DEPARTMENT NOTIFIED Performed at [facility], [place] [address] [place], [place] [address]    Report Status 06/23/2017 FINAL  Final    Organism ID, Bacteria STREPTOCOCCUS PYOGENES  Final      Susceptibility   Streptococcus pyogenes - MIC*    PENICILLIN <=0.06 SENSITIVE Sensitive     CEFTRIAXONE <=0.12 SENSITIVE Sensitive     ERYTHROMYCIN <=0.12 SENSITIVE Sensitive     LEVOFLOXACIN <=0.25 SENSITIVE Sensitive     VANCOMYCIN 0.5 SENSITIVE Sensitive     * STREPTOCOCCUS PYOGENES  Blood Culture ID Panel (Reflexed)     Status: Abnormal   Collection Time: 06/20/17  1:04 PM  Result Value Ref Range Status   Enterococcus species NOT DETECTED NOT DETECTED Final   Listeria monocytogenes NOT DETECTED NOT DETECTED Final   Staphylococcus species NOT DETECTED NOT DETECTED Final   Staphylococcus aureus NOT DETECTED NOT DETECTED Final   Streptococcus species DETECTED (A) NOT DETECTED Final    Comment: CRITICAL RESULT CALLED TO, READ BACK BY AND VERIFIED WITH: L SEAY,PHARMD AT 0707 06/21/17 BY L BENFIELD    Streptococcus agalactiae NOT DETECTED NOT DETECTED Final   Streptococcus pneumoniae NOT DETECTED NOT DETECTED Final   Streptococcus pyogenes DETECTED (A) NOT DETECTED Final    Comment: CRITICAL RESULT CALLED TO, READ BACK BY AND VERIFIED WITH: L SEAY,PHARMD AT 0707 06/21/17 BY L BENFIELD    Acinetobacter baumannii NOT DETECTED NOT DETECTED Final   Enterobacteriaceae species NOT DETECTED NOT DETECTED Final   Enterobacter cloacae complex NOT DETECTED NOT DETECTED Final   Escherichia coli NOT DETECTED NOT DETECTED Final   Klebsiella oxytoca NOT DETECTED NOT DETECTED Final   Klebsiella pneumoniae NOT DETECTED NOT  DETECTED Final   Proteus species NOT DETECTED NOT DETECTED Final   Serratia marcescens NOT DETECTED NOT DETECTED Final   Haemophilus influenzae NOT DETECTED NOT DETECTED Final   Neisseria meningitidis NOT DETECTED NOT DETECTED Final   Pseudomonas aeruginosa NOT DETECTED NOT DETECTED Final   Candida albicans NOT DETECTED NOT DETECTED Final   Candida glabrata NOT DETECTED NOT DETECTED Final   Candida krusei NOT DETECTED NOT  DETECTED Final   Candida parapsilosis NOT DETECTED NOT DETECTED Final   Candida tropicalis NOT DETECTED NOT DETECTED Final    Comment: Performed at [facility], [place] [address], [place], [place] [address]  Culture, blood (routine x 2)     Status: None (Preliminary result)   Collection Time: 06/21/17  9:50 AM  Result Value Ref Range Status   Specimen Description BLOOD LEFT ANTECUBITAL  Final   Special Requests   Final    BOTTLES DRAWN AEROBIC ONLY Blood Culture adequate volume   Culture   Final    NO GROWTH 3 DAYS Performed at [facility], [place] [address] [place], [place] [address]    Report Status PENDING  Incomplete  Culture, blood (routine x 2)     Status: None (Preliminary result)   Collection Time: 06/21/17  9:55 AM  Result Value Ref Range Status   Specimen Description BLOOD RIGHT ANTECUBITAL  Final   Special Requests   Final    BOTTLES DRAWN AEROBIC ONLY Blood Culture adequate volume   Culture   Final    NO GROWTH 3 DAYS Performed at [facility], [place] [address] [place], [place] [address]    Report Status PENDING  Incomplete    Microbiology: Recent Results (from the past 240 hour(s))  Blood culture (routine x 2)     Status: Abnormal (Preliminary result)   Collection Time: 06/20/17 12:13 PM  Result Value Ref Range Status   Specimen Description BLOOD LEFT ANTECUBITAL  Final   Special Requests   Final    BOTTLES DRAWN AEROBIC AND ANAEROBIC Blood Culture adequate volume   Culture  Setup Time   Final    GRAM POSITIVE COCCI IN CHAINS IN BOTH AEROBIC AND ANAEROBIC BOTTLES CRITICAL VALUE NOTED.  VALUE IS CONSISTENT WITH PREVIOUSLY REPORTED AND CALLED VALUE.    Culture (A)  Final    GROUP A STREP (S.PYOGENES) ISOLATED SUSCEPTIBILITIES PERFORMED ON PREVIOUS CULTURE WITHIN THE LAST 5 DAYS. HEALTH DEPARTMENT NOTIFIED SENT TO SATE LAB FOR SEROTYPING Performed at [facility], [place] [address] [place], [place] [address]    Report Status PENDING   Incomplete  Blood culture (routine x 2)     Status: Abnormal   Collection Time: 06/20/17  1:04 PM  Result Value Ref Range Status   Specimen Description BLOOD RIGHT HAND  Final   Special Requests   Final    BOTTLES DRAWN AEROBIC AND ANAEROBIC Blood Culture adequate volume   Culture  Setup Time   Final    GRAM POSITIVE COCCI IN CHAINS IN BOTH AEROBIC AND ANAEROBIC BOTTLES CRITICAL RESULT CALLED TO, READ BACK BY AND VERIFIED WITH: L SEAY,PHARMD AT 0707 06/21/17 BY L BENFIELD    Culture (A)  Final    STREPTOCOCCUS PYOGENES HEALTH DEPARTMENT NOTIFIED Performed at [facility], [place] [address], [place], [place] [address]    Report Status 06/23/2017 FINAL  Final   Organism ID, Bacteria STREPTOCOCCUS PYOGENES  Final      Susceptibility   Streptococcus pyogenes - MIC*  PENICILLIN <=0.06 SENSITIVE Sensitive     CEFTRIAXONE <=0.12 SENSITIVE Sensitive     ERYTHROMYCIN <=0.12 SENSITIVE Sensitive     LEVOFLOXACIN <=0.25 SENSITIVE Sensitive     VANCOMYCIN 0.5 SENSITIVE Sensitive     * STREPTOCOCCUS PYOGENES  Blood Culture ID Panel (Reflexed)     Status: Abnormal   Collection Time: 06/20/17  1:04 PM  Result Value Ref Range Status   Enterococcus species NOT DETECTED NOT DETECTED Final   Listeria monocytogenes NOT DETECTED NOT DETECTED Final   Staphylococcus species NOT DETECTED NOT DETECTED Final   Staphylococcus aureus NOT DETECTED NOT DETECTED Final   Streptococcus species DETECTED (A) NOT DETECTED Final    Comment: CRITICAL RESULT CALLED TO, READ BACK BY AND VERIFIED WITH: L SEAY,PHARMD AT 0707 06/21/17 BY L BENFIELD    Streptococcus agalactiae NOT DETECTED NOT DETECTED Final   Streptococcus pneumoniae NOT DETECTED NOT DETECTED Final   Streptococcus pyogenes DETECTED (A) NOT DETECTED Final    Comment: CRITICAL RESULT CALLED TO, READ BACK BY AND VERIFIED WITH: L SEAY,PHARMD AT 0707 06/21/17 BY L BENFIELD    Acinetobacter baumannii NOT DETECTED NOT DETECTED Final   Enterobacteriaceae  species NOT DETECTED NOT DETECTED Final   Enterobacter cloacae complex NOT DETECTED NOT DETECTED Final   Escherichia coli NOT DETECTED NOT DETECTED Final   Klebsiella oxytoca NOT DETECTED NOT DETECTED Final   Klebsiella pneumoniae NOT DETECTED NOT DETECTED Final   Proteus species NOT DETECTED NOT DETECTED Final   Serratia marcescens NOT DETECTED NOT DETECTED Final   Haemophilus influenzae NOT DETECTED NOT DETECTED Final   Neisseria meningitidis NOT DETECTED NOT DETECTED Final   Pseudomonas aeruginosa NOT DETECTED NOT DETECTED Final   Candida albicans NOT DETECTED NOT DETECTED Final   Candida glabrata NOT DETECTED NOT DETECTED Final   Candida krusei NOT DETECTED NOT DETECTED Final   Candida parapsilosis NOT DETECTED NOT DETECTED Final   Candida tropicalis NOT DETECTED NOT DETECTED Final    Comment: Performed at [facility], [place]. [address] [place], [place] [address]  Culture, blood (routine x 2)     Status: None (Preliminary result)   Collection Time: 06/21/17  9:50 AM  Result Value Ref Range Status   Specimen Description BLOOD LEFT ANTECUBITAL  Final   Special Requests   Final    BOTTLES DRAWN AEROBIC ONLY Blood Culture adequate volume   Culture   Final    NO GROWTH 3 DAYS Performed at [facility], [place] [address] [place], [place] [address]    Report Status PENDING  Incomplete  Culture, blood (routine x 2)     Status: None (Preliminary result)   Collection Time: 06/21/17  9:55 AM  Result Value Ref Range Status   Specimen Description BLOOD RIGHT ANTECUBITAL  Final   Special Requests   Final    BOTTLES DRAWN AEROBIC ONLY Blood Culture adequate volume   Culture   Final    NO GROWTH 3 DAYS Performed at [facility], [place] [address], [place], [place] [address]    Report Status PENDING  Incomplete    Radiographs and labs were personally reviewed by me.   Bobby Rumpf, MD [facility] [phone] 06/24/2017, 3:16 PM

## 2017-06-25 ENCOUNTER — Ambulatory Visit (HOSPITAL_COMMUNITY): Payer: Medicare HMO

## 2017-06-25 DIAGNOSIS — B954 Other streptococcus as the cause of diseases classified elsewhere: Secondary | ICD-10-CM

## 2017-06-25 DIAGNOSIS — R7881 Bacteremia: Secondary | ICD-10-CM

## 2017-06-25 DIAGNOSIS — B955 Unspecified streptococcus as the cause of diseases classified elsewhere: Secondary | ICD-10-CM

## 2017-06-25 LAB — CBC
HCT: 37.4 % — ABNORMAL LOW (ref 39.0–52.0)
HEMOGLOBIN: 12.6 g/dL — AB (ref 13.0–17.0)
MCH: 29 pg (ref 26.0–34.0)
MCHC: 33.7 g/dL (ref 30.0–36.0)
MCV: 86.2 fL (ref 78.0–100.0)
PLATELETS: 268 10*3/uL (ref 150–400)
RBC: 4.34 MIL/uL (ref 4.22–5.81)
RDW: 13.7 % (ref 11.5–15.5)
WBC: 10.9 10*3/uL — ABNORMAL HIGH (ref 4.0–10.5)

## 2017-06-25 LAB — BASIC METABOLIC PANEL
Anion gap: 8 (ref 5–15)
BUN: 20 mg/dL (ref 6–20)
CALCIUM: 8.3 mg/dL — AB (ref 8.9–10.3)
CO2: 29 mmol/L (ref 22–32)
CREATININE: 1.31 mg/dL — AB (ref 0.61–1.24)
Chloride: 98 mmol/L — ABNORMAL LOW (ref 101–111)
GFR, EST NON AFRICAN AMERICAN: 53 mL/min — AB (ref >60)
Glucose, Bld: 109 mg/dL — ABNORMAL HIGH (ref 65–99)
Potassium: 4 mmol/L (ref 3.5–5.1)
SODIUM: 135 mmol/L (ref 135–145)

## 2017-06-25 LAB — GLUCOSE, CAPILLARY
GLUCOSE-CAPILLARY: 99 mg/dL (ref 65–99)
GLUCOSE-CAPILLARY: 230 mg/dL — AB (ref 65–99)
Glucose-Capillary: 185 mg/dL — ABNORMAL HIGH (ref 65–99)
Glucose-Capillary: 200 mg/dL — ABNORMAL HIGH (ref 65–99)

## 2017-06-25 MED ORDER — COLLAGENASE 250 UNIT/GM EX OINT
TOPICAL_OINTMENT | Freq: Every day | CUTANEOUS | Status: DC
  Administered 2017-06-25 – 2017-06-26 (×2): via TOPICAL
  Filled 2017-06-25: qty 30

## 2017-06-25 MED ORDER — FUROSEMIDE 10 MG/ML IJ SOLN
40.0000 mg | INTRAMUSCULAR | Status: DC
  Administered 2017-06-25 – 2017-06-26 (×2): 40 mg via INTRAVENOUS
  Filled 2017-06-25 (×2): qty 4

## 2017-06-25 NOTE — Consult Note (Signed)
WOC Nurse wound consult note Reason for Consult:  Cellulitis to left lower leg.  Patient does not wear compression. He has a history of coronary artery disease.  No ABI on file, we will only add modified light compression for edema management.  Wound type:infectious Pressure Injury POA: NA Measurement: LEft anterior lower leg, distal:  3 cm x 0.4 cm 100% slough Left anterior lower leg, proximal:  1 cm x 0.5 cm 100% slough Left medial/posterior leg with serum filled blister intact  Wound bed: slough Drainage (amount, consistency, odor)minimal serosanguinous no odor  Periwound:Edema, erythema, improving Dressing procedure/placement/frequency:  Cleanse left leg with NS and pat dry.  Apply Santyl to slough in anterior leg wounds. Cover with NS moist gauze and ABD pad. Wrap leg from below toes to below knee with kelrix and secure with ace wrap.  Change daily.  Bedside RN To perform.  Will not follow at this time.  Please re-consult if needed.  Maple Hudson RN BSN CWON Pager [phone]

## 2017-06-25 NOTE — Progress Notes (Addendum)
PROGRESS NOTE        PATIENT DETAILS Name: James [name] Age: 72 y.o. Sex: male Date of Birth: May 03, 1945 Admit Date: 06/20/2017 Admitting Physician Lahoma Crocker, MD ZOX:[id number], Jeannett Senior, MD  Brief Narrative: Patient is a 72 y.o. male history of chronic venous stasis dermatitis in bilateral lower extremities, chronic diastolic heart failure, hypertension, DM-2-presented to the ED with sepsis and acute metabolic encephalopathy secondary to left leg cellulitis.  Subsequent blood culture positive for group A streptococci.  Slowly improving with supportive care.  See below for further details.  Subjective: Continues to improve-no fever-feels that swelling in the left leg has decreased somewhat compared to yesterday.  Assessment/Plan: Sepsis secondary to cellulitis of the left lower extremity and group A streptococcal bacteremia: Sepsis pathophysiology has resolved-cellulitis slowly improving but continues to have erythema and swelling in the left lower extremity.  Lower extremity Dopplers were negative.  Echocardiogram did not show any vegetations.  Repeat blood cultures on 5/5 continue to's to be negative.  Seen by wound care-placed on a lower extremity compressive dressing.  Continue with penicillin G, infectious disease recommending transitioning to amoxicillin on discharge for a total of 3 weeks.  Acute metabolic encephalopathy: Secondary to sepsis-resolved-he is completely awake and alert.  Acute kidney injury on chronic kidney disease stage III: Acute kidney injury likely hemodynamically mediated due to sepsis.  Renal function has improved and currently back to usual baseline.   Acute on chronic diastolic heart failure: Compensated-continues to have significant edema in the lower extremities-continue Lasix 40 mg twice daily.  Continue to follow weights/intake/output and electrolytes closely while on Lasix.  Insulin-dependent DM-2: CBG stable with insulin 70/30  20 units twice daily.  Follow and adjust accordingly.  Continue to hold all oral hypoglycemic agents.  CAD: No anginal symptoms-continue aspirin and beta-blocker..  Chronic venous stasis dermatitis bilateral lower extremity  DVT Prophylaxis: Prophylactic Lovenox   Code Status: Full code   Family Communication: None at bedside  Disposition Plan: Remain inpatient-home on discharge over the next few days.  Antimicrobial agents: Anti-infectives (From admission, onward)   Start     Dose/Rate Route Frequency Ordered Stop   06/23/17 0800  penicillin G potassium 3 Million Units in dextrose 50mL IVPB  Status:  Discontinued     3 Million Units 100 mL/hr over 30 Minutes Intravenous Every 4 hours 06/22/17 1144 06/22/17 1912   06/23/17 0800  penicillin G potassium 3 Million Units in dextrose 50mL IVPB  Status:  Discontinued     3 Million Units 100 mL/hr over 30 Minutes Intravenous Every 4 hours 06/22/17 1912 06/22/17 1914   06/23/17 0800  penicillin G potassium 3 Million Units in dextrose 5 % 100 mL IVPB     3 Million Units 200 mL/hr over 30 Minutes Intravenous Every 4 hours 06/22/17 1914     06/22/17 1400  clindamycin (CLEOCIN) IVPB 900 mg  Status:  Discontinued     900 mg 100 mL/hr over 30 Minutes Intravenous Every 8 hours 06/22/17 1144 06/24/17 1553   06/21/17 0800  cefTRIAXone (ROCEPHIN) 2 g in sodium chloride 0.9 % 100 mL IVPB  Status:  Discontinued     2 g 200 mL/hr over 30 Minutes Intravenous Every 24 hours 06/21/17 0722 06/22/17 1144   06/21/17 0100  vancomycin (VANCOCIN) IVPB 1000 mg/200 mL premix  Status:  Discontinued  1,000 mg 200 mL/hr over 60 Minutes Intravenous Every 12 hours 06/20/17 1215 06/21/17 0721   06/20/17 2100  piperacillin-tazobactam (ZOSYN) IVPB 3.375 g  Status:  Discontinued     3.375 g 12.5 mL/hr over 240 Minutes Intravenous Every 8 hours 06/20/17 1215 06/21/17 0721   06/20/17 1215  piperacillin-tazobactam (ZOSYN) IVPB 3.375 g     3.375 g 100 mL/hr over  30 Minutes Intravenous  Once 06/20/17 1205 06/20/17 1325   06/20/17 1215  vancomycin (VANCOCIN) IVPB 1000 mg/200 mL premix     1,000 mg 200 mL/hr over 60 Minutes Intravenous  Once 06/20/17 1205 06/20/17 1501      Procedures: None  CONSULTS:  ID  Time spent: 25- minutes-Greater than 50% of this time was spent in counseling, explanation of diagnosis, planning of further management, and coordination of care.  MEDICATIONS: Scheduled Meds: . aspirin EC  81 mg Oral Daily  . cholecalciferol  2,000 Units Oral Daily  . collagenase   Topical Daily  . enoxaparin (LOVENOX) injection  60 mg Subcutaneous Q24H  . ezetimibe  10 mg Oral Daily  . furosemide  40 mg Intravenous 2 times per day  . insulin aspart  0-20 Units Subcutaneous TID WC  . insulin aspart  0-5 Units Subcutaneous QHS  . insulin aspart protamine- aspart  20 Units Subcutaneous BID AC  . metoprolol succinate  100 mg Oral Daily  . multivitamin with minerals  1 tablet Oral Daily  . omega-3 acid ethyl esters  2 g Oral BID  . potassium chloride  40 mEq Oral Daily  . pramipexole  1.5 mg Oral BID  . pravastatin  40 mg Oral Daily  . sodium chloride flush  3 mL Intravenous Q12H   Continuous Infusions: . penicillin G 2.5 - 3.5 MILLION UNITS IVPB 3 Million Units (06/25/17 1244)   PRN Meds:.acetaminophen **OR** acetaminophen, magnesium citrate, ondansetron **OR** ondansetron (ZOFRAN) IV, oxyCODONE   PHYSICAL EXAM: Vital signs: Vitals:   06/24/17 1000 06/24/17 2108 06/25/17 0508 06/25/17 0948  BP: (!) 146/78 (!) 135/91 (!) 148/91 (!) 164/75  Pulse: 68 75 63 72  Resp: Temp: 98.6 F (37 C) 98.2 F (36.8 C) 98.3 F (36.8 C) 99.1 F (37.3 C)  TempSrc: Oral Oral Oral Oral  SpO2: 98% 97% 95% 99%  Weight:      Height:       Filed Weights   06/21/17 0500 06/21/17 2107 06/22/17 0500  Weight: 128.4 kg (283 lb 1.1 oz) 128.4 kg (283 lb 1.1 oz) 128.4 kg (283 lb 1.1 oz)   Body mass index is 38.39 kg/m.  General  appearance :Awake, alert, not in any distress.  Eyes:, pupils equally reactive to light and accomodation,no scleral icterus. HEENT: Atraumatic and Normocephalic Neck: supple, no JVD. Resp:Good air entry bilaterally, to auscultation bilaterally CVS: S1 S2 regular, no murmurs.  GI: Bowel sounds present, Non tender and not distended with no gaurding, rigidity or rebound. Extremities: Lower extremity with erythema and swelling, right lower extremity still has some mild swelling-and has evidence of chronic venous stasis changes.  Neurology:  speech clear,Non focal, sensation is grossly intact. Psychiatric: Normal judgment and insight. Normal mood. Musculoskeletal:No digital cyanosis Skin:No Rash, warm and dry Wounds:N/A  I have personally reviewed following labs and imaging studies  LABORATORY DATA: CBC: Recent Labs  Lab 06/18/17 2151 06/20/17 1208  06/21/17 0521 06/22/17 0525 06/23/17 0724 06/24/17 0359 06/25/17 0708  WBC 10.2 18.7*   < > 14.3* 13.4* 12.1* 10.5 10.9*  NEUTROABS 7.0 17.2*  --   --   --   --   --   --   HGB 14.5 14.4   < > 14.0 12.9* 12.8* 12.7* 12.6*  HCT 42.6 42.5   < > 41.8 39.1 38.5* 38.4* 37.4*  MCV 86.6 86.6   < > 87.3 86.9 87.1 86.7 86.2  PLT 233 207   < > 179 177 197 223 268   < > = values in this interval not displayed.    Basic Metabolic Panel: Recent Labs  Lab 06/21/17 0521 06/22/17 0525 06/23/17 0724 06/24/17 0359 06/25/17 0708  NA 135 134* 135 135 135  K 4.0 3.9 3.5 3.4* 4.0  CL 97* 101 98* 98* 98*  CO2 26 23 26 29 29   GLUCOSE 128* 124* 68 97 109*  BUN 32* 28* 27* 22* 20  CREATININE 2.01* 1.53* 1.52* 1.35* 1.31*  CALCIUM 8.3* 7.8* 8.2* 8.2* 8.3*    GFR: Estimated Creatinine Clearance: 70.6 mL/min (A) (by C-G formula based on SCr of 1.31 mg/dL (H)).  Liver Function Tests: Recent Labs  Lab 06/20/17 1208  AST 36  ALT 17  ALKPHOS 85  BILITOT 1.4*  PROT 7.1  ALBUMIN 3.7   No results for input(s): LIPASE, AMYLASE in the last 168  hours. No results for input(s): AMMONIA in the last 168 hours.  Coagulation Profile: No results for input(s): INR, PROTIME in the last 168 hours.  Cardiac Enzymes: No results for input(s): CKTOTAL, CKMB, CKMBINDEX, TROPONINI in the last 168 hours.  BNP (last 3 results) Recent Labs    06/18/17 1001  PROBNP 274    HbA1C: No results for input(s): HGBA1C in the last 72 hours.  CBG: Recent Labs  Lab 06/24/17 1339 06/24/17 1620 06/24/17 2108 06/25/17 0742 06/25/17 1140  GLUCAP 143* 161* 139* 99 230*    Lipid Profile: No results for input(s): CHOL, HDL, LDLCALC, TRIG, CHOLHDL, LDLDIRECT in the last 72 hours.  Thyroid Function Tests: No results for input(s): TSH, T4TOTAL, FREET4, T3FREE, THYROIDAB in the last 72 hours.  Anemia Panel: No results for input(s): VITAMINB12, FOLATE, FERRITIN, TIBC, IRON, RETICCTPCT in the last 72 hours.  Urine analysis:    Component Value Date/Time   COLORURINE AMBER (A) 06/20/2017 1649   APPEARANCEUR CLOUDY (A) 06/20/2017 1649   LABSPEC 1.031 (H) 06/20/2017 1649   PHURINE 5.0 06/20/2017 1649   GLUCOSEU 50 (A) 06/20/2017 1649   HGBUR MODERATE (A) 06/20/2017 1649   BILIRUBINUR SMALL (A) 06/20/2017 1649   KETONESUR NEGATIVE 06/20/2017 1649   PROTEINUR >=300 (A) 06/20/2017 1649   UROBILINOGEN 1.0 10/24/2011 1742   NITRITE NEGATIVE 06/20/2017 1649   LEUKOCYTESUR NEGATIVE 06/20/2017 1649    Sepsis Labs: Lactic Acid, Venous    Component Value Date/Time   LATICACIDVEN 3.71 (HH) 06/20/2017 1546    MICROBIOLOGY: Recent Results (from the past 240 hour(s))  Blood culture (routine x 2)     Status: Abnormal (Preliminary result)   Collection Time: 06/20/17 12:13 PM  Result Value Ref Range Status   Specimen Description BLOOD LEFT ANTECUBITAL  Final   Special Requests   Final    BOTTLES DRAWN AEROBIC AND ANAEROBIC Blood Culture adequate volume   Culture  Setup Time   Final    GRAM POSITIVE COCCI IN CHAINS IN BOTH AEROBIC AND ANAEROBIC  BOTTLES CRITICAL VALUE NOTED.  VALUE IS CONSISTENT WITH PREVIOUSLY REPORTED AND CALLED VALUE.    Culture (A)  Final    GROUP A STREP (S.PYOGENES) ISOLATED SUSCEPTIBILITIES PERFORMED ON  PREVIOUS CULTURE WITHIN THE LAST 5 DAYS. HEALTH DEPARTMENT NOTIFIED SENT TO SATE LAB FOR SEROTYPING Performed at [facility], [address] [place], [place] [address]    Report Status PENDING  Incomplete  Blood culture (routine x 2)     Status: Abnormal   Collection Time: 06/20/17  1:04 PM  Result Value Ref Range Status   Specimen Description BLOOD RIGHT HAND  Final   Special Requests   Final    BOTTLES DRAWN AEROBIC AND ANAEROBIC Blood Culture adequate volume   Culture  Setup Time   Final    GRAM POSITIVE COCCI IN CHAINS IN BOTH AEROBIC AND ANAEROBIC BOTTLES CRITICAL RESULT CALLED TO, READ BACK BY AND VERIFIED WITH: L SEAY,PHARMD AT 0707 06/21/17 BY L BENFIELD    Culture (A)  Final    STREPTOCOCCUS PYOGENES HEALTH DEPARTMENT NOTIFIED Performed at [facility], [address] [place], [place] [address]    Report Status 06/23/2017 FINAL  Final   Organism ID, Bacteria STREPTOCOCCUS PYOGENES  Final      Susceptibility   Streptococcus pyogenes - MIC*    PENICILLIN <=0.06 SENSITIVE Sensitive     CEFTRIAXONE <=0.12 SENSITIVE Sensitive     ERYTHROMYCIN <=0.12 SENSITIVE Sensitive     LEVOFLOXACIN <=0.25 SENSITIVE Sensitive     VANCOMYCIN 0.5 SENSITIVE Sensitive     * STREPTOCOCCUS PYOGENES  Blood Culture ID Panel (Reflexed)     Status: Abnormal   Collection Time: 06/20/17  1:04 PM  Result Value Ref Range Status   Enterococcus species NOT DETECTED NOT DETECTED Final   Listeria monocytogenes NOT DETECTED NOT DETECTED Final   Staphylococcus species NOT DETECTED NOT DETECTED Final   Staphylococcus aureus NOT DETECTED NOT DETECTED Final   Streptococcus species DETECTED (A) NOT DETECTED Final    Comment: CRITICAL RESULT CALLED TO, READ BACK BY AND VERIFIED WITH: L SEAY,PHARMD AT 0707  06/21/17 BY L BENFIELD    Streptococcus agalactiae NOT DETECTED NOT DETECTED Final   Streptococcus pneumoniae NOT DETECTED NOT DETECTED Final   Streptococcus pyogenes DETECTED (A) NOT DETECTED Final    Comment: CRITICAL RESULT CALLED TO, READ BACK BY AND VERIFIED WITH: L SEAY,PHARMD AT 0707 06/21/17 BY L BENFIELD    Acinetobacter baumannii NOT DETECTED NOT DETECTED Final   Enterobacteriaceae species NOT DETECTED NOT DETECTED Final   Enterobacter cloacae complex NOT DETECTED NOT DETECTED Final   Escherichia coli NOT DETECTED NOT DETECTED Final   Klebsiella oxytoca NOT DETECTED NOT DETECTED Final   Klebsiella pneumoniae NOT DETECTED NOT DETECTED Final   Proteus species NOT DETECTED NOT DETECTED Final   Serratia marcescens NOT DETECTED NOT DETECTED Final   Haemophilus influenzae NOT DETECTED NOT DETECTED Final   Neisseria meningitidis NOT DETECTED NOT DETECTED Final   Pseudomonas aeruginosa NOT DETECTED NOT DETECTED Final   Candida albicans NOT DETECTED NOT DETECTED Final   Candida glabrata NOT DETECTED NOT DETECTED Final   Candida krusei NOT DETECTED NOT DETECTED Final   Candida parapsilosis NOT DETECTED NOT DETECTED Final   Candida tropicalis NOT DETECTED NOT DETECTED Final    Comment: Performed at [facility], [address] [place], [place] [address]  Culture, blood (routine x 2)     Status: None (Preliminary result)   Collection Time: 06/21/17  9:50 AM  Result Value Ref Range Status   Specimen Description BLOOD LEFT ANTECUBITAL  Final   Special Requests   Final    BOTTLES DRAWN AEROBIC ONLY Blood Culture adequate volume  Culture   Final    NO GROWTH 4 DAYS Performed at [facility], [address], [place], [place] [address]    Report Status PENDING  Incomplete  Culture, blood (routine x 2)     Status: None (Preliminary result)   Collection Time: 06/21/17  9:55 AM  Result Value Ref Range Status   Specimen Description BLOOD RIGHT ANTECUBITAL  Final   Special  Requests   Final    BOTTLES DRAWN AEROBIC ONLY Blood Culture adequate volume   Culture   Final    NO GROWTH 4 DAYS Performed at [facility], [address], [place], [place] [address]    Report Status PENDING  Incomplete    RADIOLOGY STUDIES/RESULTS: Ct Angio Chest Pe W And/or Wo Contrast  Result Date: 06/18/2017 CLINICAL DATA:  Shortness of breath, lower extremity edema, elevated D-dimer EXAM: CT ANGIOGRAPHY CHEST WITH CONTRAST TECHNIQUE: Multidetector CT imaging of the chest was performed using the standard protocol during bolus administration of intravenous contrast. Multiplanar CT image reconstructions and MIPs were obtained to evaluate the vascular anatomy. CONTRAST:  67mL ISOVUE-370 IOPAMIDOL (ISOVUE-370) INJECTION 76% COMPARISON:  Chest radiographs dated 12/08/2013 FINDINGS: Cardiovascular: Satisfactory opacification of the bilateral pulmonary arteries to the segmental level. No evidence of pulmonary embolism. No evidence of thoracic aortic aneurysm. Atherosclerotic calcifications of the aortic arch. Cardiomegaly.  No pericardial effusion. Three vessel atherosclerosis. Postsurgical changes related to prior CABG. Mediastinum/Nodes: No suspicious mediastinal lymphadenopathy. Visualized thyroid is unremarkable. Lungs/Pleura: Mild linear scarring/atelectasis in the left upper lobe. Masslike rounded opacity at the left lung base (series 6/image 122), likely reflecting rounded atelectasis, although technically indeterminate. Mild dependent atelectasis in the right lower lobe. Mild ground-glass opacity/mosaic attenuation in the lungs bilaterally. 3 mm subpleural nodule in the right upper lobe (series 6/image 45), likely benign. No pleural effusion or pneumothorax. Upper Abdomen: Visualized upper abdomen is grossly unremarkable. Musculoskeletal: Mild degenerative changes of the visualized thoracolumbar spine. Median sternotomy. Review of the MIP images confirms the above findings. IMPRESSION: No  evidence of pulmonary embolism. Masslike rounded opacity in the left lung base, likely reflecting rounded atelectasis, although technically indeterminate. Consider follow-up CT chest in 3 months. 3 mm subpleural nodule in the right upper lobe, likely benign. No follow-up needed if patient is low-risk. Non-contrast chest CT can be considered in 12 months if patient is high-risk. This recommendation follows the consensus statement: Guidelines for Management of Incidental Pulmonary Nodules Detected on CT Images: From the Fleischner Society 2017; Radiology 2017; 284:228-243. Aortic Atherosclerosis (ICD10-I70.0). Electronically Signed   By: Charline Bills M.D.   On: 06/18/2017 23:06   Dg Abdomen Acute W/chest  Result Date: 06/20/2017 CLINICAL DATA:  72 year old male with a history of abdominal distention and shortness of breath EXAM: DG ABDOMEN ACUTE W/ 1V CHEST COMPARISON:  CT 06/18/2017, no prior abdominal CT FINDINGS: Chest: Cardiomediastinal silhouette persistently enlarged. Surgical changes of median sternotomy and CABG. Low lung volumes with coarsened interstitial markings. Abdomen: Gas within stomach, small bowel, colon. No abnormal distention. Large stool burden. Gas extends to the rectum. No radiopaque foreign body. No unexpected calcification or soft tissue density. IMPRESSION: Chest: Low lung volumes with no evidence of lobar pneumonia. Surgical changes of median sternotomy and CABG. Abdomen: Nonobstructive bowel gas pattern. Large stool burden, potentially representing constipation. Electronically Signed   By: Gilmer Mor D.O.   On: 06/20/2017 12:41     LOS: 5 days   Jeoffrey Massed, MD  [facility] Pager:[phone]  If 7PM-7AM, please  contact night-coverage [email or web address] Password [email or web address] 06/25/2017, 3:05 PM

## 2017-06-25 NOTE — Progress Notes (Signed)
[facility]  Date of Admission:  06/20/2017             ASSESSMENT/PLAN  James [name] has continually improving cellulitis with Strep Pyogenes bacteremia. He has remained afebrile with repeat blood cultures with no growth x 3 days. On day 6 of antimicrobial therapy and Day 3 of Penicillin. Appears to be tolerating antibiotics with no adverse side effects or rashes. Discussed importance of nutrition, wound care and compression to help with edema of the legs.   1. Recommend continued penicillin while in the hospital and transition to amoxicillin for 3 weeks.  2. Continue to work on A1c control.  3. Wound care per [facility] RN recommendations.  Principal Problem:   Sepsis (HCC) Active Problems:   Hypertension   Coronary artery disease   Hyperlipidemia   DM (diabetes mellitus), type 2, uncontrolled with complications (HCC)   GERD (gastroesophageal reflux disease)   Chronic diastolic heart failure (HCC)   Cellulitis of left lower extremity   . aspirin EC  81 mg Oral Daily  . cholecalciferol  2,000 Units Oral Daily  . enoxaparin (LOVENOX) injection  60 mg Subcutaneous Q24H  . ezetimibe  10 mg Oral Daily  . furosemide  40 mg Intravenous 2 times per day  . insulin aspart  0-20 Units Subcutaneous TID WC  . insulin aspart  0-5 Units Subcutaneous QHS  . insulin aspart protamine- aspart  20 Units Subcutaneous BID AC  . metoprolol succinate  100 mg Oral Daily  . multivitamin with minerals  1 tablet Oral Daily  . omega-3 acid ethyl esters  2 g Oral BID  . potassium chloride  40 mEq Oral Daily  . pramipexole  1.5 mg Oral BID  . pravastatin  40 mg Oral Daily  . sodium chloride flush  3 mL Intravenous Q12H    SUBJECTIVE:  Afebrile overnight with stable and improving WBC. Continues to receive Penicillin and denies adverse side effects. Notes some improvement in redness.   Allergies  Allergen Reactions  . Lisinopril Cough     Review of Systems: Review of  Systems  Constitutional: Negative for chills and fever.  Respiratory: Negative for cough, shortness of breath and wheezing.   Cardiovascular: Positive for leg swelling. Negative for chest pain.  Gastrointestinal: Negative for abdominal pain, diarrhea, nausea and vomiting.  Skin: Positive for rash.      OBJECTIVE: Vitals:   06/24/17 1000 06/24/17 2108 06/25/17 0508 06/25/17 0948  BP: (!) 146/78 (!) 135/91 (!) 148/91 (!) 164/75  Pulse: 68 75 63 72  Resp: Temp: 98.6 F (37 C) 98.2 F (36.8 C) 98.3 F (36.8 C) 99.1 F (37.3 C)  TempSrc: Oral Oral Oral Oral  SpO2: 98% 97% 95% 99%  Weight:      Height:       Body mass index is 38.39 kg/m.  Physical Exam  Constitutional: He is oriented to person, place, and time. He appears well-developed and well-nourished. No distress.  Cardiovascular: Normal rate, regular rhythm, normal heart sounds and intact distal pulses.  Pulmonary/Chest: Effort normal and breath sounds normal.  Neurological: He is alert and oriented to person, place, and time.  Skin: Skin is warm and dry.  Psychiatric: He has a normal mood and affect. His behavior is normal. Judgment and thought content normal.        Lab Results Lab Results  Component Value Date   WBC 10.9 (H) 06/25/2017   HGB 12.6 (L) 06/25/2017  HCT 37.4 (L) 06/25/2017   MCV 86.2 06/25/2017   PLT 268 06/25/2017    Lab Results  Component Value Date   CREATININE 1.31 (H) 06/25/2017   BUN 20 06/25/2017   NA 135 06/25/2017   K 4.0 06/25/2017   CL 98 (L) 06/25/2017   CO2 29 06/25/2017    Lab Results  Component Value Date   ALT 17 06/20/2017   AST 36 06/20/2017   ALKPHOS 85 06/20/2017   BILITOT 1.4 (H) 06/20/2017     Microbiology: Recent Results (from the past 240 hour(s))  Blood culture (routine x 2)     Status: Abnormal (Preliminary result)   Collection Time: 06/20/17 12:13 PM  Result Value Ref Range Status   Specimen Description BLOOD LEFT ANTECUBITAL  Final    Special Requests   Final    BOTTLES DRAWN AEROBIC AND ANAEROBIC Blood Culture adequate volume   Culture  Setup Time   Final    GRAM POSITIVE COCCI IN CHAINS IN BOTH AEROBIC AND ANAEROBIC BOTTLES CRITICAL VALUE NOTED.  VALUE IS CONSISTENT WITH PREVIOUSLY REPORTED AND CALLED VALUE.    Culture (A)  Final    GROUP A STREP (S.PYOGENES) ISOLATED SUSCEPTIBILITIES PERFORMED ON PREVIOUS CULTURE WITHIN THE LAST 5 DAYS. HEALTH DEPARTMENT NOTIFIED SENT TO SATE LAB FOR SEROTYPING Performed at [facility], [address] [place], [place] [address]    Report Status PENDING  Incomplete  Blood culture (routine x 2)     Status: Abnormal   Collection Time: 06/20/17  1:04 PM  Result Value Ref Range Status   Specimen Description BLOOD RIGHT HAND  Final   Special Requests   Final    BOTTLES DRAWN AEROBIC AND ANAEROBIC Blood Culture adequate volume   Culture  Setup Time   Final    GRAM POSITIVE COCCI IN CHAINS IN BOTH AEROBIC AND ANAEROBIC BOTTLES CRITICAL RESULT CALLED TO, READ BACK BY AND VERIFIED WITH: L SEAY,PHARMD AT 0707 06/21/17 BY L BENFIELD    Culture (A)  Final    STREPTOCOCCUS PYOGENES HEALTH DEPARTMENT NOTIFIED Performed at [facility], [address], [place], [place] [address]    Report Status 06/23/2017 FINAL  Final   Organism ID, Bacteria STREPTOCOCCUS PYOGENES  Final      Susceptibility   Streptococcus pyogenes - MIC*    PENICILLIN <=0.06 SENSITIVE Sensitive     CEFTRIAXONE <=0.12 SENSITIVE Sensitive     ERYTHROMYCIN <=0.12 SENSITIVE Sensitive     LEVOFLOXACIN <=0.25 SENSITIVE Sensitive     VANCOMYCIN 0.5 SENSITIVE Sensitive     * STREPTOCOCCUS PYOGENES  Blood Culture ID Panel (Reflexed)     Status: Abnormal   Collection Time: 06/20/17  1:04 PM  Result Value Ref Range Status   Enterococcus species NOT DETECTED NOT DETECTED Final   Listeria monocytogenes NOT DETECTED NOT DETECTED Final   Staphylococcus species NOT DETECTED NOT DETECTED Final   Staphylococcus  aureus NOT DETECTED NOT DETECTED Final   Streptococcus species DETECTED (A) NOT DETECTED Final    Comment: CRITICAL RESULT CALLED TO, READ BACK BY AND VERIFIED WITH: L SEAY,PHARMD AT 0707 06/21/17 BY L BENFIELD    Streptococcus agalactiae NOT DETECTED NOT DETECTED Final   Streptococcus pneumoniae NOT DETECTED NOT DETECTED Final   Streptococcus pyogenes DETECTED (A) NOT DETECTED Final    Comment: CRITICAL RESULT CALLED TO, READ BACK BY AND VERIFIED WITH: L SEAY,PHARMD AT 0707 06/21/17 BY L BENFIELD    Acinetobacter baumannii NOT DETECTED NOT DETECTED Final   Enterobacteriaceae  species NOT DETECTED NOT DETECTED Final   Enterobacter cloacae complex NOT DETECTED NOT DETECTED Final   Escherichia coli NOT DETECTED NOT DETECTED Final   Klebsiella oxytoca NOT DETECTED NOT DETECTED Final   Klebsiella pneumoniae NOT DETECTED NOT DETECTED Final   Proteus species NOT DETECTED NOT DETECTED Final   Serratia marcescens NOT DETECTED NOT DETECTED Final   Haemophilus influenzae NOT DETECTED NOT DETECTED Final   Neisseria meningitidis NOT DETECTED NOT DETECTED Final   Pseudomonas aeruginosa NOT DETECTED NOT DETECTED Final   Candida albicans NOT DETECTED NOT DETECTED Final   Candida glabrata NOT DETECTED NOT DETECTED Final   Candida krusei NOT DETECTED NOT DETECTED Final   Candida parapsilosis NOT DETECTED NOT DETECTED Final   Candida tropicalis NOT DETECTED NOT DETECTED Final    Comment: Performed at [facility], [address], [place], [place] [address]  Culture, blood (routine x 2)     Status: None (Preliminary result)   Collection Time: 06/21/17  9:50 AM  Result Value Ref Range Status   Specimen Description BLOOD LEFT ANTECUBITAL  Final   Special Requests   Final    BOTTLES DRAWN AEROBIC ONLY Blood Culture adequate volume   Culture   Final    NO GROWTH 3 DAYS Performed at [facility], [address], [place], [place] [address]    Report Status PENDING  Incomplete  Culture, blood  (routine x 2)     Status: None (Preliminary result)   Collection Time: 06/21/17  9:55 AM  Result Value Ref Range Status   Specimen Description BLOOD RIGHT ANTECUBITAL  Final   Special Requests   Final    BOTTLES DRAWN AEROBIC ONLY Blood Culture adequate volume   Culture   Final    NO GROWTH 3 DAYS Performed at [facility], [address] [place], [place] [address]    Report Status PENDING  Incomplete     Marcos Eke, NP [facility] [phone] Pager  06/25/2017  10:31 AM

## 2017-06-25 NOTE — Progress Notes (Signed)
Expected Discharge Plan:  Home w Home Health Services Discharge planning Services  CM Consult Post Acute Care Choice:  Home Health Choice offered to:  Patient HH Arranged:  RN [facility] Agency:  [facility]  In to speak with patient. Discussed recommendation for home health RN, patient agreed. Offered choice patient selected [facility].  Referral for [facility] RN called to [facility] liaison with [facility], accepted.  NCM will continue to follow for discharge needs.  Cloyde Reams, RN Nurse Case Manager

## 2017-06-25 NOTE — Care Management Important Message (Signed)
Important Message  Patient Details  Name: MALEEK CRAVER MRN: [id number] Date of Birth: 1945/05/03   Medicare Important Message Given:  Yes    Dorena Bodo 06/25/2017, 2:13 PM

## 2017-06-25 NOTE — Consult Note (Signed)
            [facility] CM Primary Care Navigator  06/25/2017  James [name] [facility] 02-28-1945 [id number]   Went to see patient at the bedside to identify possible discharge needs but RN is in the room providing patient care and medication. RN requested for writer to return back since wound dressing change will be done as well.  Will attempt to see patient at another time when available in the room.    Addendum: (06/26/17):  Went back to see patient in the room today to identify possible discharge needs but RN had already discharged patient (with daughter). RN reports that patient was discharged home with home health services per therapy recommendation.   Patient was seen for sepsis and acute metabolic encephalopathy secondary to cellulitis of the left lower extremity and bacteremia,  .  Primary care provider's officeis listed as providingtransition of care (TOC)follow-up.   Patient has discharge instruction to follow-up withprimary care provider and cardiology in 1 week.   For additional questions please contact:  Karin Golden A. Arah Aro, BSN, RN-BC [facility] PRIMARY CARE Navigator Cell: [phone]

## 2017-06-26 ENCOUNTER — Telehealth: Payer: Self-pay | Admitting: Cardiology

## 2017-06-26 LAB — CULTURE, BLOOD (ROUTINE X 2)
CULTURE: NO GROWTH
Culture: NO GROWTH
Special Requests: ADEQUATE
Special Requests: ADEQUATE

## 2017-06-26 LAB — BASIC METABOLIC PANEL
ANION GAP: 12 (ref 5–15)
BUN: 23 mg/dL — ABNORMAL HIGH (ref 6–20)
CALCIUM: 8.9 mg/dL (ref 8.9–10.3)
CO2: 26 mmol/L (ref 22–32)
Chloride: 96 mmol/L — ABNORMAL LOW (ref 101–111)
Creatinine, Ser: 1.53 mg/dL — ABNORMAL HIGH (ref 0.61–1.24)
GFR, EST AFRICAN AMERICAN: 51 mL/min — AB (ref >60)
GFR, EST NON AFRICAN AMERICAN: 44 mL/min — AB (ref >60)
GLUCOSE: 128 mg/dL — AB (ref 65–99)
POTASSIUM: 4.5 mmol/L (ref 3.5–5.1)
Sodium: 134 mmol/L — ABNORMAL LOW (ref 135–145)

## 2017-06-26 LAB — GLUCOSE, CAPILLARY
Glucose-Capillary: 128 mg/dL — ABNORMAL HIGH (ref 65–99)
Glucose-Capillary: 243 mg/dL — ABNORMAL HIGH (ref 65–99)

## 2017-06-26 MED ORDER — COLLAGENASE 250 UNIT/GM EX OINT: TOPICAL_OINTMENT | Freq: Every day | CUTANEOUS | 0 refills | Status: DC

## 2017-06-26 MED ORDER — TORSEMIDE 20 MG PO TABS: 40.0000 mg | ORAL_TABLET | Freq: Every day | ORAL | 0 refills | Status: DC

## 2017-06-26 MED ORDER — AMOXICILLIN 500 MG PO CAPS
500.0000 mg | ORAL_CAPSULE | Freq: Three times a day (TID) | ORAL | 0 refills | Status: DC
Start: 2017-06-26 — End: 2017-07-24

## 2017-06-26 MED ORDER — AMOXICILLIN 500 MG PO CAPS
500.0000 mg | ORAL_CAPSULE | Freq: Three times a day (TID) | ORAL | Status: DC
  Filled 2017-06-26: qty 1

## 2017-06-26 MED FILL — TORSEMIDE 20 MG TABLET: 20 | 30 days supply | Qty: 60 | Fill #0

## 2017-06-26 MED FILL — AMOXICILLIN 500 MG CAPSULE: 500 | 18 days supply | Qty: 56 | Fill #0

## 2017-06-26 NOTE — Discharge Summary (Addendum)
PATIENT DETAILS Name: James [name] Age: 72 y.o. Sex: male Date of Birth: 1945/10/06 MRN: [id number]. Admitting Physician: Lahoma Crocker, MD WUJ:[id number], Jeannett Senior, MD  Admit Date: 06/20/2017 Discharge date: 06/26/2017  Recommendations for Outpatient Follow-up:  1. Follow up with PCP in 1-2 weeks 2. Please obtain BMP/CBC in one week 3. Ensure follow-up with cardiology. 4. Incidental finding: Has a rounded masslike opacity in the left lung base-likely atelectasis-repeat CT chest in 3 months (see below) 5. Incidental finding: Has a subpleural lung nodule-repeat CT chest in 12 months  Admitted From:  Home  Disposition: Home with home health services   Home Health: Yes  Equipment/Devices: None  Discharge Condition: Stable  CODE STATUS: FULL CODE  Diet recommendation:  Heart Healthy / Carb Modified   Brief Summary: See H&P, Labs, Consult and Test reports for all details in brief, Patient is a 72 y.o. male history of chronic venous stasis dermatitis in bilateral lower extremities, chronic diastolic heart failure, hypertension, DM-2-presented to the ED with sepsis and acute metabolic encephalopathy secondary to left leg cellulitis.  Subsequent blood culture positive for group A streptococci.  Slowly improving with supportive care.  See below for further details.   Brief Hospital Course: Sepsis secondary to cellulitis of the left lower extremity and group A streptococcal bacteremia: Sepsis pathophysiology has resolved-cellulitis has improved-he continues to have some erythema and swelling of his lower extremities that seems to be getting better slowly every day.  Lower extremity Dopplers were negative.  Echocardiogram did not show any vegetations.  Repeat blood cultures on 5/5 continue to be negative.  Evaluated by infectious disease recommendations are for a total of 3 weeks of antimicrobial therapy-as recommended we will transition to amoxicillin on discharge.  Have consulted  wound care-patient's lower extremity will be wrapped-home health RN arranged.  Patient instructed to keep left lower extremity elevated as much as possible.  Acute metabolic encephalopathy: Secondary to sepsis-resolved-he is completely awake and alert.  Acute kidney injury on chronic kidney disease stage III: Acute kidney injury likely hemodynamically mediated due to sepsis.  Renal function has improved and currently back to usual baseline.   Acute on chronic diastolic heart failure: Compensated-continues to have very minimal edema in the lower extremities-we will discharge him on Demadex 40 mg p.o. daily.    Hypertension: Controlled-continue with metoprolol and lisinopril on discharge-due to worsening lower extremity edema-have discontinued amlodipine.  Hold Aldactone for now-reassess at next visit and if volume status is still not optimal-consider resumption of Aldactone then.  Have discontinued Benicar.  Insulin-dependent DM-2: CBG stable with insulin 70/30 20 units twice daily.  Follow and adjust accordingly.    Due to slight bump in creatinine-we will continue to hold metformin on discharge-reassess at next visit.    CAD: No anginal symptoms-continue aspirin and beta-blocker..  Chronic venous stasis dermatitis bilateral lower extremity  Incidental finding of a masslike rounded opacity in the left lung base and a 3 mm subpleural nodule on CT chest: Per radiology-recommendations are to repeat CT chest in 3 months to evaluate the masslike rounded opacity in the left lung base, and to repeat CT chest in 12 months to evaluate the 3 mm subpleural nodule.  Procedures/Studies: None  Discharge Diagnoses:  Principal Problem:   Sepsis (HCC) Active Problems:   Hypertension   Coronary artery disease   Hyperlipidemia   DM (diabetes mellitus), type 2, uncontrolled with complications (HCC)   GERD (gastroesophageal reflux disease)   Chronic diastolic heart failure (HCC)   Cellulitis  of left  lower extremity   Streptococcal bacteremia   Discharge Instructions:  Activity:  As tolerated with Full fall precautions use walker/cane & assistance as needed   Discharge Instructions    Diet - low sodium heart healthy   Complete by:  As directed    Discharge instructions   Complete by:  As directed    Follow with Primary MD  Joycelyn Rua, MD and Dr. Carolanne Grumbling in 1 week  Keep your left lower extremity elevated as much as possible.  1)Masslike rounded opacity in the left lung base, likely reflecting rounded atelectasis-please ask your primary care MD to repeat CT chest in 3 months.  2)3 mm subpleural nodule in the right upper lobe, likely benign.  Please ask your primary care MD to repeat CT chest in 12 months.  follow-up needed if patient is low-risk. Non-contrast chest CT can    Please get a complete blood count and chemistry panel checked by your Primary MD at your next visit, and again as instructed by your Primary MD.  Get Medicines reviewed and adjusted: Please take all your medications with you for your next visit with your Primary MD  Laboratory/radiological data: Please request your Primary MD to go over all hospital tests and procedure/radiological results at the follow up, please ask your Primary MD to get all Hospital records sent to his/her office.  In some cases, they will be blood work, cultures and biopsy results pending at the time of your discharge. Please request that your primary care M.D. follows up on these results.  Also Note the following: If you experience worsening of your admission symptoms, develop shortness of breath, life threatening emergency, suicidal or homicidal thoughts you must seek medical attention immediately by calling 911 or calling your MD immediately  if symptoms less severe.  You must read complete instructions/literature along with all the possible adverse reactions/side effects for all the Medicines you take and that have  been prescribed to you. Take any new Medicines after you have completely understood and accpet all the possible adverse reactions/side effects.   Do not drive when taking Pain medications or sleeping medications (Benzodaizepines)  Do not take more than prescribed Pain, Sleep and Anxiety Medications. It is not advisable to combine anxiety,sleep and pain medications without talking with your primary care practitioner  Special Instructions: If you have smoked or chewed Tobacco  in the last 2 yrs please stop smoking, stop any regular Alcohol  and or any Recreational drug use.  Wear Seat belts while driving.  Please note: You were cared for by a hospitalist during your hospital stay. Once you are discharged, your primary care physician will handle any further medical issues. Please note that NO REFILLS for any discharge medications will be authorized once you are discharged, as it is imperative that you return to your primary care physician (or establish a relationship with a primary care physician if you do not have one) for your post hospital discharge needs so that they can reassess your need for medications and monitor your lab values.   Increase activity slowly   Complete by:  As directed      Allergies as of 06/26/2017      Reactions   Lisinopril Cough      Medication List    STOP taking these medications   amLODipine 10 MG tablet Commonly known as:  NORVASC   furosemide 20 MG tablet Commonly known as:  LASIX   metFORMIN 500 MG tablet Commonly known  as:  GLUCOPHAGE   olmesartan 40 MG tablet Commonly known as:  BENICAR   spironolactone 25 MG tablet Commonly known as:  ALDACTONE     TAKE these medications   amoxicillin 500 MG capsule Commonly known as:  AMOXIL Take 1 capsule (500 mg total) by mouth every 8 (eight) hours.   aspirin 81 MG tablet Take 81 mg by mouth daily.   cholecalciferol 1000 units tablet Commonly known as:  VITAMIN D Take 2,000 Units by mouth daily.    collagenase ointment Commonly known as:  SANTYL Apply topically daily. Cleanse left leg with NS and pat dry.  Apply Santyl to slough in anterior leg wounds. Cover with NS moist gauze and ABD pad. Wrap leg from below toes to below knee with kelrix and secure with ace wrap.  Change daily.  Bedside RN To perform.   ezetimibe 10 MG tablet Commonly known as:  ZETIA Take 1 tablet (10 mg total) by mouth daily.   fish oil-omega-3 fatty acids 1000 MG capsule Take 2 g by mouth 2 (two) times daily.   glimepiride 4 MG tablet Commonly known as:  AMARYL Take 4 mg by mouth daily.   HUMALOG MIX 75/25 KWIKPEN (75-25) 100 UNIT/ML Kwikpen Generic drug:  Insulin Lispro Prot & Lispro Inject 35 mLs as directed 2 (two) times daily.   isosorbide mononitrate 60 MG 24 hr tablet Commonly known as:  IMDUR TAKE 1 TABLET (60 MG TOTAL) BY MOUTH DAILY.   lisinopril 10 MG tablet Commonly known as:  PRINIVIL,ZESTRIL Take 10 mg by mouth daily.   metoprolol succinate 100 MG 24 hr tablet Commonly known as:  TOPROL-XL TAKE 1 TABLET BY MOUTH DAILY TAKE WITH OR IMMEDIATELY FOLLOWING A MEAL What changed:  Another medication with the same name was removed. Continue taking this medication, and follow the directions you see here.   multivitamin with minerals Tabs tablet Take 1 tablet by mouth daily.   potassium chloride 10 MEQ tablet Commonly known as:  K-DUR Take 1 tablet (10 mEq total) by mouth daily.   pramipexole 0.75 MG tablet Commonly known as:  MIRAPEX Take 1.5 mg by mouth at bedtime.   pravastatin 40 MG tablet Commonly known as:  PRAVACHOL Take 40 mg by mouth daily.   torsemide 20 MG tablet Commonly known as:  DEMADEX Take 2 tablets (40 mg total) by mouth daily.      Follow-up Information    Joycelyn Rua, MD. Schedule an appointment as soon as possible for a visit in 1 week(s).   Specialty:  Family Medicine Contact information: [address] [place]  [address] [phone]        [facility] Follow up.   Specialty:  Home Health Services Why:  They will call you to set up intial visit Contact information: [address] [place] [address] [phone]        Quintella Reichert, MD. Schedule an appointment as soon as possible for a visit in 1 week(s).   Specialty:  Cardiology Contact information: [address] [place] [address] [phone]          Allergies  Allergen Reactions  . Lisinopril Cough    Consultations:   ID  Other Procedures/Studies: Ct Angio Chest Pe W And/or Wo Contrast  Result Date: 06/18/2017 CLINICAL DATA:  Shortness of breath, lower extremity edema, elevated D-dimer EXAM: CT ANGIOGRAPHY CHEST WITH CONTRAST TECHNIQUE: Multidetector CT imaging of the chest was performed using the  standard protocol during bolus administration of intravenous contrast. Multiplanar CT image reconstructions and MIPs were obtained to evaluate the vascular anatomy. CONTRAST:  67mL ISOVUE-370 IOPAMIDOL (ISOVUE-370) INJECTION 76% COMPARISON:  Chest radiographs dated 12/08/2013 FINDINGS: Cardiovascular: Satisfactory opacification of the bilateral pulmonary arteries to the segmental level. No evidence of pulmonary embolism. No evidence of thoracic aortic aneurysm. Atherosclerotic calcifications of the aortic arch. Cardiomegaly.  No pericardial effusion. Three vessel atherosclerosis. Postsurgical changes related to prior CABG. Mediastinum/Nodes: No suspicious mediastinal lymphadenopathy. Visualized thyroid is unremarkable. Lungs/Pleura: Mild linear scarring/atelectasis in the left upper lobe. Masslike rounded opacity at the left lung base (series 6/image 122), likely reflecting rounded atelectasis, although technically indeterminate. Mild dependent atelectasis in the right lower lobe. Mild ground-glass opacity/mosaic attenuation in the lungs bilaterally. 3 mm subpleural nodule in the right upper  lobe (series 6/image 45), likely benign. No pleural effusion or pneumothorax. Upper Abdomen: Visualized upper abdomen is grossly unremarkable. Musculoskeletal: Mild degenerative changes of the visualized thoracolumbar spine. Median sternotomy. Review of the MIP images confirms the above findings. IMPRESSION: No evidence of pulmonary embolism. Masslike rounded opacity in the left lung base, likely reflecting rounded atelectasis, although technically indeterminate. Consider follow-up CT chest in 3 months. 3 mm subpleural nodule in the right upper lobe, likely benign. No follow-up needed if patient is low-risk. Non-contrast chest CT can be considered in 12 months if patient is high-risk. This recommendation follows the consensus statement: Guidelines for Management of Incidental Pulmonary Nodules Detected on CT Images: From the Fleischner Society 2017; Radiology 2017; 284:228-243. Aortic Atherosclerosis (ICD10-I70.0). Electronically Signed   By: Charline Bills M.D.   On: 06/18/2017 23:06   Dg Abdomen Acute W/chest  Result Date: 06/20/2017 CLINICAL DATA:  72 year old male with a history of abdominal distention and shortness of breath EXAM: DG ABDOMEN ACUTE W/ 1V CHEST COMPARISON:  CT 06/18/2017, no prior abdominal CT FINDINGS: Chest: Cardiomediastinal silhouette persistently enlarged. Surgical changes of median sternotomy and CABG. Low lung volumes with coarsened interstitial markings. Abdomen: Gas within stomach, small bowel, colon. No abnormal distention. Large stool burden. Gas extends to the rectum. No radiopaque foreign body. No unexpected calcification or soft tissue density. IMPRESSION: Chest: Low lung volumes with no evidence of lobar pneumonia. Surgical changes of median sternotomy and CABG. Abdomen: Nonobstructive bowel gas pattern. Large stool burden, potentially representing constipation. Electronically Signed   By: Gilmer Mor D.O.   On: 06/20/2017 12:41     TODAY-DAY OF  DISCHARGE:  Subjective:   James [name] today has no headache,no chest abdominal pain,no new weakness tingling or numbness, feels much better wants to go home today.   Objective:   Blood pressure (!) 142/73, pulse 67, temperature 98.4 F (36.9 C), temperature source Oral, resp. rate 17, height 6' (1.829 m), weight 128.4 kg (283 lb 1.1 oz), SpO2 94 %.  Intake/Output Summary (Last 24 hours) at 06/26/2017 1046 Last data filed at 06/26/2017 0600 Gross per 24 hour  Intake 2440 ml  Output 2725 ml  Net -285 ml   Filed Weights   06/21/17 2107 06/22/17 0500 06/26/17 0349  Weight: 128.4 kg (283 lb 1.1 oz) 128.4 kg (283 lb 1.1 oz) 128.4 kg (283 lb 1.1 oz)    Exam: Awake Alert, Oriented *3, No new F.N deficits, Normal affect [place].AT,PERRAL Supple Neck,No JVD, No cervical lymphadenopathy appriciated.  Symmetrical Chest wall movement, Good air movement bilaterally, CTAB RRR,No Gallops,Rubs or new Murmurs, No Parasternal Heave +ve B.Sounds, Abd Soft, Non tender, No organomegaly appriciated, No rebound -guarding or rigidity. No  Cyanosis, Clubbing or edema, No new Rash or bruise    PERTINENT RADIOLOGIC STUDIES: Ct Angio Chest Pe W And/or Wo Contrast  Result Date: 06/18/2017 CLINICAL DATA:  Shortness of breath, lower extremity edema, elevated D-dimer EXAM: CT ANGIOGRAPHY CHEST WITH CONTRAST TECHNIQUE: Multidetector CT imaging of the chest was performed using the standard protocol during bolus administration of intravenous contrast. Multiplanar CT image reconstructions and MIPs were obtained to evaluate the vascular anatomy. CONTRAST:  67mL ISOVUE-370 IOPAMIDOL (ISOVUE-370) INJECTION 76% COMPARISON:  Chest radiographs dated 12/08/2013 FINDINGS: Cardiovascular: Satisfactory opacification of the bilateral pulmonary arteries to the segmental level. No evidence of pulmonary embolism. No evidence of thoracic aortic aneurysm. Atherosclerotic calcifications of the aortic arch. Cardiomegaly.  No pericardial  effusion. Three vessel atherosclerosis. Postsurgical changes related to prior CABG. Mediastinum/Nodes: No suspicious mediastinal lymphadenopathy. Visualized thyroid is unremarkable. Lungs/Pleura: Mild linear scarring/atelectasis in the left upper lobe. Masslike rounded opacity at the left lung base (series 6/image 122), likely reflecting rounded atelectasis, although technically indeterminate. Mild dependent atelectasis in the right lower lobe. Mild ground-glass opacity/mosaic attenuation in the lungs bilaterally. 3 mm subpleural nodule in the right upper lobe (series 6/image 45), likely benign. No pleural effusion or pneumothorax. Upper Abdomen: Visualized upper abdomen is grossly unremarkable. Musculoskeletal: Mild degenerative changes of the visualized thoracolumbar spine. Median sternotomy. Review of the MIP images confirms the above findings. IMPRESSION: No evidence of pulmonary embolism. Masslike rounded opacity in the left lung base, likely reflecting rounded atelectasis, although technically indeterminate. Consider follow-up CT chest in 3 months. 3 mm subpleural nodule in the right upper lobe, likely benign. No follow-up needed if patient is low-risk. Non-contrast chest CT can be considered in 12 months if patient is high-risk. This recommendation follows the consensus statement: Guidelines for Management of Incidental Pulmonary Nodules Detected on CT Images: From the Fleischner Society 2017; Radiology 2017; 284:228-243. Aortic Atherosclerosis (ICD10-I70.0). Electronically Signed   By: Charline Bills M.D.   On: 06/18/2017 23:06   Dg Abdomen Acute W/chest  Result Date: 06/20/2017 CLINICAL DATA:  72 year old male with a history of abdominal distention and shortness of breath EXAM: DG ABDOMEN ACUTE W/ 1V CHEST COMPARISON:  CT 06/18/2017, no prior abdominal CT FINDINGS: Chest: Cardiomediastinal silhouette persistently enlarged. Surgical changes of median sternotomy and CABG. Low lung volumes with coarsened  interstitial markings. Abdomen: Gas within stomach, small bowel, colon. No abnormal distention. Large stool burden. Gas extends to the rectum. No radiopaque foreign body. No unexpected calcification or soft tissue density. IMPRESSION: Chest: Low lung volumes with no evidence of lobar pneumonia. Surgical changes of median sternotomy and CABG. Abdomen: Nonobstructive bowel gas pattern. Large stool burden, potentially representing constipation. Electronically Signed   By: Gilmer Mor D.O.   On: 06/20/2017 12:41     PERTINENT LAB RESULTS: CBC: Recent Labs    06/24/17 0359 06/25/17 0708  WBC 10.5 10.9*  HGB 12.7* 12.6*  HCT 38.4* 37.4*  PLT 223 268   CMET CMP     Component Value Date/Time   NA 134 (L) 06/26/2017 0344   NA 140 06/18/2017 1001   K 4.5 06/26/2017 0344   CL 96 (L) 06/26/2017 0344   CO2 26 06/26/2017 0344   GLUCOSE 128 (H) 06/26/2017 0344   BUN 23 (H) 06/26/2017 0344   BUN 14 06/18/2017 1001   CREATININE 1.53 (H) 06/26/2017 0344   CREATININE 1.20 (H) 11/07/2015 0824   CALCIUM 8.9 06/26/2017 0344   PROT 7.1 06/20/2017 1208   PROT 6.9 04/01/2016 0923   ALBUMIN 3.7  06/20/2017 1208   ALBUMIN 4.3 04/01/2016 0923   AST 36 06/20/2017 1208   ALT 17 06/20/2017 1208   ALKPHOS 85 06/20/2017 1208   BILITOT 1.4 (H) 06/20/2017 1208   BILITOT 0.7 04/01/2016 0923   GFRNONAA 44 (L) 06/26/2017 0344   GFRAA 51 (L) 06/26/2017 0344    GFR Estimated Creatinine Clearance: 60.4 mL/min (A) (by C-G formula based on SCr of 1.53 mg/dL (H)). No results for input(s): LIPASE, AMYLASE in the last 72 hours. No results for input(s): CKTOTAL, CKMB, CKMBINDEX, TROPONINI in the last 72 hours. Invalid input(s): POCBNP No results for input(s): DDIMER in the last 72 hours. No results for input(s): HGBA1C in the last 72 hours. No results for input(s): CHOL, HDL, LDLCALC, TRIG, CHOLHDL, LDLDIRECT in the last 72 hours. No results for input(s): TSH, T4TOTAL, T3FREE, THYROIDAB in the last 72  hours.  Invalid input(s): FREET3 No results for input(s): VITAMINB12, FOLATE, FERRITIN, TIBC, IRON, RETICCTPCT in the last 72 hours. Coags: No results for input(s): INR in the last 72 hours.  Invalid input(s): PT Microbiology: Recent Results (from the past 240 hour(s))  Blood culture (routine x 2)     Status: Abnormal (Preliminary result)   Collection Time: 06/20/17 12:13 PM  Result Value Ref Range Status   Specimen Description BLOOD LEFT ANTECUBITAL  Final   Special Requests   Final    BOTTLES DRAWN AEROBIC AND ANAEROBIC Blood Culture adequate volume   Culture  Setup Time   Final    GRAM POSITIVE COCCI IN CHAINS IN BOTH AEROBIC AND ANAEROBIC BOTTLES CRITICAL VALUE NOTED.  VALUE IS CONSISTENT WITH PREVIOUSLY REPORTED AND CALLED VALUE.    Culture (A)  Final    GROUP A STREP (S.PYOGENES) ISOLATED SUSCEPTIBILITIES PERFORMED ON PREVIOUS CULTURE WITHIN THE LAST 5 DAYS. HEALTH DEPARTMENT NOTIFIED SENT TO SATE LAB FOR SEROTYPING Performed at [facility], [address], [place], [place] [address]    Report Status PENDING  Incomplete  Blood culture (routine x 2)     Status: Abnormal   Collection Time: 06/20/17  1:04 PM  Result Value Ref Range Status   Specimen Description BLOOD RIGHT HAND  Final   Special Requests   Final    BOTTLES DRAWN AEROBIC AND ANAEROBIC Blood Culture adequate volume   Culture  Setup Time   Final    GRAM POSITIVE COCCI IN CHAINS IN BOTH AEROBIC AND ANAEROBIC BOTTLES CRITICAL RESULT CALLED TO, READ BACK BY AND VERIFIED WITH: L SEAY,PHARMD AT 0707 06/21/17 BY L BENFIELD    Culture (A)  Final    STREPTOCOCCUS PYOGENES HEALTH DEPARTMENT NOTIFIED Performed at [facility], [address], [place], [place] [address]    Report Status 06/23/2017 FINAL  Final   Organism ID, Bacteria STREPTOCOCCUS PYOGENES  Final      Susceptibility   Streptococcus pyogenes - MIC*    PENICILLIN <=0.06 SENSITIVE Sensitive     CEFTRIAXONE <=0.12 SENSITIVE Sensitive      ERYTHROMYCIN <=0.12 SENSITIVE Sensitive     LEVOFLOXACIN <=0.25 SENSITIVE Sensitive     VANCOMYCIN 0.5 SENSITIVE Sensitive     * STREPTOCOCCUS PYOGENES  Blood Culture ID Panel (Reflexed)     Status: Abnormal   Collection Time: 06/20/17  1:04 PM  Result Value Ref Range Status   Enterococcus species NOT DETECTED NOT DETECTED Final   Listeria monocytogenes NOT DETECTED NOT DETECTED Final   Staphylococcus species NOT DETECTED NOT DETECTED Final   Staphylococcus aureus NOT DETECTED NOT DETECTED Final  Streptococcus species DETECTED (A) NOT DETECTED Final    Comment: CRITICAL RESULT CALLED TO, READ BACK BY AND VERIFIED WITH: L SEAY,PHARMD AT 0707 06/21/17 BY L BENFIELD    Streptococcus agalactiae NOT DETECTED NOT DETECTED Final   Streptococcus pneumoniae NOT DETECTED NOT DETECTED Final   Streptococcus pyogenes DETECTED (A) NOT DETECTED Final    Comment: CRITICAL RESULT CALLED TO, READ BACK BY AND VERIFIED WITH: L SEAY,PHARMD AT 0707 06/21/17 BY L BENFIELD    Acinetobacter baumannii NOT DETECTED NOT DETECTED Final   Enterobacteriaceae species NOT DETECTED NOT DETECTED Final   Enterobacter cloacae complex NOT DETECTED NOT DETECTED Final   Escherichia coli NOT DETECTED NOT DETECTED Final   Klebsiella oxytoca NOT DETECTED NOT DETECTED Final   Klebsiella pneumoniae NOT DETECTED NOT DETECTED Final   Proteus species NOT DETECTED NOT DETECTED Final   Serratia marcescens NOT DETECTED NOT DETECTED Final   Haemophilus influenzae NOT DETECTED NOT DETECTED Final   Neisseria meningitidis NOT DETECTED NOT DETECTED Final   Pseudomonas aeruginosa NOT DETECTED NOT DETECTED Final   Candida albicans NOT DETECTED NOT DETECTED Final   Candida glabrata NOT DETECTED NOT DETECTED Final   Candida krusei NOT DETECTED NOT DETECTED Final   Candida parapsilosis NOT DETECTED NOT DETECTED Final   Candida tropicalis NOT DETECTED NOT DETECTED Final    Comment: Performed at [facility], [address]  [place], [place] [address]  Culture, blood (routine x 2)     Status: None   Collection Time: 06/21/17  9:50 AM  Result Value Ref Range Status   Specimen Description BLOOD LEFT ANTECUBITAL  Final   Special Requests   Final    BOTTLES DRAWN AEROBIC ONLY Blood Culture adequate volume   Culture   Final    NO GROWTH 5 DAYS Performed at [facility], [address], [place], [place] [address]    Report Status 06/26/2017 FINAL  Final  Culture, blood (routine x 2)     Status: None   Collection Time: 06/21/17  9:55 AM  Result Value Ref Range Status   Specimen Description BLOOD RIGHT ANTECUBITAL  Final   Special Requests   Final    BOTTLES DRAWN AEROBIC ONLY Blood Culture adequate volume   Culture   Final    NO GROWTH 5 DAYS Performed at [facility], [address] [place], [place] [address]    Report Status 06/26/2017 FINAL  Final    FURTHER DISCHARGE INSTRUCTIONS:  Get Medicines reviewed and adjusted: Please take all your medications with you for your next visit with your Primary MD  Laboratory/radiological data: Please request your Primary MD to go over all hospital tests and procedure/radiological results at the follow up, please ask your Primary MD to get all Hospital records sent to his/her office.  In some cases, they will be blood work, cultures and biopsy results pending at the time of your discharge. Please request that your primary care M.D. goes through all the records of your hospital data and follows up on these results.  Also Note the following: If you experience worsening of your admission symptoms, develop shortness of breath, life threatening emergency, suicidal or homicidal thoughts you must seek medical attention immediately by calling 911 or calling your MD immediately  if symptoms less severe.  You must read complete instructions/literature along with all the possible adverse reactions/side effects for all the Medicines you take and that have been prescribed  to you. Take any new Medicines after you  have completely understood and accpet all the possible adverse reactions/side effects.   Do not drive when taking Pain medications or sleeping medications (Benzodaizepines)  Do not take more than prescribed Pain, Sleep and Anxiety Medications. It is not advisable to combine anxiety,sleep and pain medications without talking with your primary care practitioner  Special Instructions: If you have smoked or chewed Tobacco  in the last 2 yrs please stop smoking, stop any regular Alcohol  and or any Recreational drug use.  Wear Seat belts while driving.  Please note: You were cared for by a hospitalist during your hospital stay. Once you are discharged, your primary care physician will handle any further medical issues. Please note that NO REFILLS for any discharge medications will be authorized once you are discharged, as it is imperative that you return to your primary care physician (or establish a relationship with a primary care physician if you do not have one) for your post hospital discharge needs so that they can reassess your need for medications and monitor your lab values.  Total Time spent coordinating discharge including counseling, education and face to face time equals 35 minutes.  SignedJeoffrey Massed 06/26/2017 10:46 AM

## 2017-06-26 NOTE — Telephone Encounter (Signed)
I spoke with patient to make him aware that Dr. Mayford Knife is not in the office today. I explained to patient that her schedule is full at this time but I will confirm with her to see if there are any available spots to add him to the schedule and I will give him a call back today or early next week to get him scheduled. He verbalized understanding and thankful for the call.

## 2017-06-26 NOTE — Telephone Encounter (Signed)
New Message:       Hospital is calling for orders for pt to see Dr. Mayford Knife of App in 1 wk for hosp f/u. Pls call pt with appt

## 2017-06-27 DIAGNOSIS — I5030 Unspecified diastolic (congestive) heart failure: Secondary | ICD-10-CM | POA: Diagnosis not present

## 2017-06-27 DIAGNOSIS — Z794 Long term (current) use of insulin: Secondary | ICD-10-CM | POA: Diagnosis not present

## 2017-06-27 DIAGNOSIS — Z7982 Long term (current) use of aspirin: Secondary | ICD-10-CM | POA: Diagnosis not present

## 2017-06-27 DIAGNOSIS — I2510 Atherosclerotic heart disease of native coronary artery without angina pectoris: Secondary | ICD-10-CM | POA: Diagnosis not present

## 2017-06-27 DIAGNOSIS — I110 Hypertensive heart disease with heart failure: Secondary | ICD-10-CM | POA: Diagnosis not present

## 2017-06-27 DIAGNOSIS — K219 Gastro-esophageal reflux disease without esophagitis: Secondary | ICD-10-CM | POA: Diagnosis not present

## 2017-06-27 DIAGNOSIS — L97829 Non-pressure chronic ulcer of other part of left lower leg with unspecified severity: Secondary | ICD-10-CM | POA: Diagnosis not present

## 2017-06-27 DIAGNOSIS — E119 Type 2 diabetes mellitus without complications: Secondary | ICD-10-CM | POA: Diagnosis not present

## 2017-06-27 DIAGNOSIS — L03116 Cellulitis of left lower limb: Secondary | ICD-10-CM | POA: Diagnosis not present

## 2017-06-29 DIAGNOSIS — I110 Hypertensive heart disease with heart failure: Secondary | ICD-10-CM | POA: Diagnosis not present

## 2017-06-29 DIAGNOSIS — I5030 Unspecified diastolic (congestive) heart failure: Secondary | ICD-10-CM | POA: Diagnosis not present

## 2017-06-29 DIAGNOSIS — Z7982 Long term (current) use of aspirin: Secondary | ICD-10-CM | POA: Diagnosis not present

## 2017-06-29 DIAGNOSIS — I2510 Atherosclerotic heart disease of native coronary artery without angina pectoris: Secondary | ICD-10-CM | POA: Diagnosis not present

## 2017-06-29 DIAGNOSIS — E119 Type 2 diabetes mellitus without complications: Secondary | ICD-10-CM | POA: Diagnosis not present

## 2017-06-29 DIAGNOSIS — K219 Gastro-esophageal reflux disease without esophagitis: Secondary | ICD-10-CM | POA: Diagnosis not present

## 2017-06-29 DIAGNOSIS — L03116 Cellulitis of left lower limb: Secondary | ICD-10-CM | POA: Diagnosis not present

## 2017-06-29 DIAGNOSIS — Z794 Long term (current) use of insulin: Secondary | ICD-10-CM | POA: Diagnosis not present

## 2017-06-29 DIAGNOSIS — L97829 Non-pressure chronic ulcer of other part of left lower leg with unspecified severity: Secondary | ICD-10-CM | POA: Diagnosis not present

## 2017-06-30 NOTE — Telephone Encounter (Signed)
I spoke with patient. Per Dr. Mayford Knife have patient f/u with an extender. Patient scheduled with Tereso Newcomer, PA on 07/07/17. Patient verbalized understanding and thankful for the call

## 2017-07-01 DIAGNOSIS — N183 Chronic kidney disease, stage 3 (moderate): Secondary | ICD-10-CM | POA: Diagnosis not present

## 2017-07-01 DIAGNOSIS — L02419 Cutaneous abscess of limb, unspecified: Secondary | ICD-10-CM | POA: Diagnosis not present

## 2017-07-01 DIAGNOSIS — Z0000 Encounter for general adult medical examination without abnormal findings: Secondary | ICD-10-CM | POA: Diagnosis not present

## 2017-07-01 DIAGNOSIS — I130 Hypertensive heart and chronic kidney disease with heart failure and stage 1 through stage 4 chronic kidney disease, or unspecified chronic kidney disease: Secondary | ICD-10-CM | POA: Diagnosis not present

## 2017-07-01 DIAGNOSIS — E1121 Type 2 diabetes mellitus with diabetic nephropathy: Secondary | ICD-10-CM | POA: Diagnosis not present

## 2017-07-01 DIAGNOSIS — I872 Venous insufficiency (chronic) (peripheral): Secondary | ICD-10-CM | POA: Diagnosis not present

## 2017-07-01 DIAGNOSIS — I25119 Atherosclerotic heart disease of native coronary artery with unspecified angina pectoris: Secondary | ICD-10-CM | POA: Diagnosis not present

## 2017-07-01 DIAGNOSIS — R911 Solitary pulmonary nodule: Secondary | ICD-10-CM | POA: Diagnosis not present

## 2017-07-01 DIAGNOSIS — I5032 Chronic diastolic (congestive) heart failure: Secondary | ICD-10-CM | POA: Diagnosis not present

## 2017-07-01 LAB — CULTURE, BLOOD (ROUTINE X 2): Special Requests: ADEQUATE

## 2017-07-01 MED FILL — METOPROLOL SUCCINATE ER 100: 100 | 30 days supply | Qty: 30 | Fill #5

## 2017-07-01 MED FILL — EZETIMIBE 10 MG TABLET: 10 | 30 days supply | Qty: 30 | Fill #0

## 2017-07-01 MED FILL — LISINOPRIL 10 MG TABS: 10 | 30 days supply | Qty: 30 | Fill #0

## 2017-07-02 DIAGNOSIS — L03116 Cellulitis of left lower limb: Secondary | ICD-10-CM | POA: Diagnosis not present

## 2017-07-02 DIAGNOSIS — I110 Hypertensive heart disease with heart failure: Secondary | ICD-10-CM | POA: Diagnosis not present

## 2017-07-02 DIAGNOSIS — Z7982 Long term (current) use of aspirin: Secondary | ICD-10-CM | POA: Diagnosis not present

## 2017-07-02 DIAGNOSIS — L97829 Non-pressure chronic ulcer of other part of left lower leg with unspecified severity: Secondary | ICD-10-CM | POA: Diagnosis not present

## 2017-07-02 DIAGNOSIS — I2510 Atherosclerotic heart disease of native coronary artery without angina pectoris: Secondary | ICD-10-CM | POA: Diagnosis not present

## 2017-07-02 DIAGNOSIS — Z794 Long term (current) use of insulin: Secondary | ICD-10-CM | POA: Diagnosis not present

## 2017-07-02 DIAGNOSIS — I5030 Unspecified diastolic (congestive) heart failure: Secondary | ICD-10-CM | POA: Diagnosis not present

## 2017-07-02 DIAGNOSIS — K219 Gastro-esophageal reflux disease without esophagitis: Secondary | ICD-10-CM | POA: Diagnosis not present

## 2017-07-02 DIAGNOSIS — E119 Type 2 diabetes mellitus without complications: Secondary | ICD-10-CM | POA: Diagnosis not present

## 2017-07-03 ENCOUNTER — Telehealth: Payer: Self-pay

## 2017-07-03 NOTE — Telephone Encounter (Signed)
-

## 2017-07-06 ENCOUNTER — Telehealth (HOSPITAL_COMMUNITY): Payer: Self-pay | Admitting: *Deleted

## 2017-07-06 NOTE — Progress Notes (Signed)
Cardiology Office Note:    Date:  07/07/2017   ID:  James [name], James [name] [birth year], MRN [id number]  PCP:  Joycelyn Rua, MD  Cardiologist:  Armanda Magic, MD   Referring MD: Joycelyn Rua, MD   Chief Complaint  Patient presents with  . Hospitalization Follow-up    admx with sepsis, CHF    History of Present Illness:    James [name] is a 72 y.o. male with coronary artery disease s/p CABG, diastolic congestive heart failure, diabetes, hypertension, hyperlipidemia.  Cardiac Catheterization in 2015 demonstrated 3/4 grafts patent.  He was last seen by Dr. Mayford Knife 5/2/219.  He complained of shortness of breath at that time and Lasix was adjusted.  Echo was arranged as well as nuclear stress test.  D-dimer was elevated.  Chest CT demonstrated no pulmonary embolism.  There was a left lung base masslike rounded opacity likely reflecting atelectasis and a 3 mm subpleural nodule right upper lobe.  Follow-up CT was recommended.  The patient was then admitted 5/4-5/10 with sepsis due to left lower extremity cellulitis.  He did develop volume overload requiring IV Lasix and AKI.  Of note, an Echo did demonstrate worsening LVF with EF 40-45.  He was not seen by Cardiology.  Of note, nuclear stress test is still pending (scheduled for 07/08/2017).  James [name] returns for posthospitalization follow-up.  He is here alone.  Since discharge, he is doing much better.  His breathing has improved significantly.  He denies chest discomfort.  He denies PND.  Lower extremity swelling is also markedly improved.  He denies dizziness or near syncope.  Prior CV studies:   The following studies were reviewed today:  Echo 06/24/17 Mild LVH, EF 40-45, poss ant-sept HK, mod RAE  Chest CTA 06/18/17 IMPRESSION: No evidence of pulmonary embolism.  Masslike rounded opacity in the left lung base, likely reflecting rounded atelectasis, although technically indeterminate. Consider follow-up CT chest in 3 months.  3 mm  subpleural nodule in the right upper lobe, likely benign. No follow-up needed if patient is low-risk. Non-contrast chest CT can be considered in 12 months if patient is high-risk. This recommendation follows the consensus statement: Guidelines for Management of Incidental Pulmonary Nodules Detected on CT Images: From the Fleischner Society 2017; Radiology 2017; 284:228-243.  Aortic Atherosclerosis (ICD10-I70.0).  Echo 11/07/15 EF 55, no RWMA, Gr 2 DD, Aortic root 38 mm, ascending aorta 38 mm, mild RVE, normal RVSF, mild RAE  Echo 04/11/14 Mod conc LVH, EF 55-60, no REWMA, Gr 1 DD, ascending aorta 39 mm, mild MVR (ant leaflet), mild LAE, mild RVE  LHC (9/15):  Dist LM 50, pLAD 90 then 100, pCFX 50, mCFX 75, oRCA 75, dRCA 95 L-LAD patent S-Dx 100 S-OM1 patent S-PDA patent EF 55%  native Dx small caliber and not well suited for PCI; native RCA amenable to PCI and would restore flow to PLA branches but would jeopardize SVG-RCA   Myoview (9/15):   ant ischemia, poss TID, EF 43%, high risk  Past Medical History:  Diagnosis Date  . Aortic atherosclerosis (HCC) 07/07/2017  . Ascending aortic aneurysm (HCC)    cMRI (2/15): Normal LV EF 54%, Mild BAE, Trileaflet Aortic Valve, Upper limits of normal ascending aorta 3.8 cm, Normal aortic arch 2.3 cm with bovine origin of left carotid  . Atrial flutter (HCC)    Post-op with no reoccurence  . Chronic diastolic heart failure (HCC)    Echo (1/15): Left ventricle: The cavity size was mildly dilated. Mild LVH.  EF 55% to 60%. Wall motion was normal; Gr 1 diastolic dysfunction Left atrium: The atrium was mildly dilated. Right atrium: The atrium was mildly to moderately dilated. Aortic arch measures 4.8 cm (moderately dilated); suggest CTA or MRA to better assess.  . Coronary artery disease 10/2011   a. s/p cabg;  b. Myoview (9/15):  ant ischemia, poss TID, EF 43%, high risk >> LHC (9/15): Dist LM 50, pLAD 90 then 100, pCFX 50, mCFX 75, oRCA 75,  dRCA 95, L-LAD patent, S-Dx 100, S-OM1 patent, S-PDA patent, EF 55% >> native Dx small caliber and not well suited for PCI; native RCA amenable to PCI and would restore flow to PLA branches but would jeopardize SVG-RCA - Med Rx  . Deviated nasal septum   . Diabetes mellitus    onset 2013  . Dyslipidemia   . GERD (gastroesophageal reflux disease)   . Hx of Doppler ultrasound    Carotid US (9/13): No ICA stenosis  . Hypertension    Surgical Hx: The patient  has a past surgical history that includes left knee arthroscopy (2003); right knee arthroscopy (2006); Cardiac catheterization; Coronary artery bypass graft (10/27/2011); left heart catheterization with coronary angiogram (N/A, 10/23/2011); and left heart catheterization with coronary angiogram (N/A, 11/16/2013).   Current Medications: Current Meds  Medication Sig  . amoxicillin (AMOXIL) 500 MG capsule Take 1 capsule (500 mg total) by mouth every 8 (eight) hours.  Marland Kitchen aspirin 81 MG tablet Take 81 mg by mouth daily.  . cholecalciferol (VITAMIN D) 1000 units tablet Take 2,000 Units by mouth daily.  . collagenase (SANTYL) ointment Apply topically daily. Cleanse left leg with NS and pat dry.  Apply Santyl to slough in anterior leg wounds. Cover with NS moist gauze and ABD pad. Wrap leg from below toes to below knee with kelrix and secure with ace wrap.  Change daily.  Bedside RN To perform.  . ezetimibe (ZETIA) 10 MG tablet Take 1 tablet (10 mg total) by mouth daily.  . fish oil-omega-3 fatty acids 1000 MG capsule Take 2 g by mouth 2 (two) times daily.   Marland Kitchen glimepiride (AMARYL) 4 MG tablet Take 4 mg by mouth daily.  Marland Kitchen HUMALOG MIX 75/25 KWIKPEN (75-25) 100 UNIT/ML Kwikpen Inject 35 mLs as directed 2 (two) times daily.   . isosorbide mononitrate (IMDUR) 60 MG 24 hr tablet TAKE 1 TABLET (60 MG TOTAL) BY MOUTH DAILY.  Marland Kitchen lisinopril (PRINIVIL,ZESTRIL) 10 MG tablet Take 10 mg by mouth daily.  . metoprolol succinate (TOPROL-XL) 100 MG 24 hr tablet TAKE 1  TABLET BY MOUTH DAILY TAKE WITH OR IMMEDIATELY FOLLOWING A MEAL  . Multiple Vitamin (MULTIVITAMIN WITH MINERALS) TABS Take 1 tablet by mouth daily.  . potassium chloride (K-DUR) 10 MEQ tablet Take 1 tablet (10 mEq total) by mouth daily.  . pramipexole (MIRAPEX) 0.75 MG tablet Take 1.5 mg by mouth at bedtime.   . pravastatin (PRAVACHOL) 40 MG tablet Take 40 mg by mouth daily.  Marland Kitchen torsemide (DEMADEX) 20 MG tablet Take 2 tablets (40 mg total) by mouth daily.     Allergies:   Lisinopril   Social History   Tobacco Use  . Smoking status: Never Smoker  . Smokeless tobacco: Never Used  Substance Use Topics  . Alcohol use: Yes    Comment: 1 beer a month or less  . Drug use: No     Family Hx: The patient's family history includes Hypertension in his father, mother, and paternal grandfather; Stroke in his father.  There is no history of Heart attack.  ROS:   Please see the history of present illness.    Review of Systems  Cardiovascular: Positive for leg swelling.  Musculoskeletal: Positive for joint pain.   All other systems reviewed and are negative.   EKGs/Labs/Other Test Reviewed:    EKG:  EKG is  ordered today.  The ekg ordered today demonstrates normal sinus rhythm, heart rate 61, normal axis, nonspecific ST-T wave changes with J-point elevation in lead III, QTC 440, similar to prior tracing dated 06/18/2017  Recent Labs: 06/18/2017: NT-Pro BNP 274; TSH 3.340 06/20/2017: ALT 17 06/25/2017: Hemoglobin 12.6; Platelets 268 06/26/2017: BUN 23; Creatinine, Ser 1.53; Potassium 4.5; Sodium 134   Recent Lipid Panel Lab Results  Component Value Date/Time   CHOL 125 04/01/2016 12:00 AM   TRIG 109 04/01/2016 12:00 AM   HDL 37 (L) 04/01/2016 12:00 AM   CHOLHDL 3.4 04/01/2016 12:00 AM   CHOLHDL 5.5 (H) 11/07/2015 08:24 AM   LDLCALC 66 04/01/2016 12:00 AM    Physical Exam:    VS:  BP 138/78   Pulse 66   Ht 6' (1.829 m)   Wt 262 lb 8 oz (119.1 kg)   SpO2 96%   BMI 35.60 kg/m     Wt  Readings from Last 3 Encounters:  07/07/17 262 lb 8 oz (119.1 kg)  06/26/17 283 lb 1.1 oz (128.4 kg)  06/18/17 283 lb (128.4 kg)     Physical Exam  Constitutional: He is oriented to person, place, and time. He appears well-developed and well-nourished. No distress.  HENT:  Head: Normocephalic and atraumatic.  Neck: Neck supple. No JVD present.  Cardiovascular: Normal rate, regular rhythm, S1 normal and S2 normal.  No murmur heard. Pulmonary/Chest: Breath sounds normal. He has no rales.  Abdominal: Soft.  Musculoskeletal: He exhibits edema (1-2+ brawny bilat edema with chronic changes; L>R).  Neurological: He is alert and oriented to person, place, and time.  Skin: Skin is warm and dry.    ASSESSMENT & PLAN:    Chronic systolic CHF (congestive heart failure) (HCC)  EF was previously normal.  In the hospital, his EF was 40-45 by echocardiogram.  Volume status is much improved on torsemide.  He is NYHA 2.  Recent labs with primary care 07/01/2017: Creatinine 1.69, BUN 27, potassium 4.5.  Etiology of his reduced ejection fraction is not clear.  Question if related to sepsis.  I reviewed this with Dr. Mayford Knife.  As his symptoms have markedly improved, we will proceed with nuclear stress testing tomorrow to further evaluate.  -Continue beta-blocker, ACE inhibitor, nitrates  -Continue current dose of torsemide  -Obtain follow-up BMET today, consider reducing dose of torsemide  -Consider resuming spironolactone at next visit if renal function stable  Coronary artery disease involving native coronary artery of native heart without angina pectoris  History of CABG.  Cardiac catheterization in 2015 with 3/4 grafts patent.  He has been managed medically.  He denies anginal symptoms.  Stress testing is arranged for tomorrow.  Continue aspirin, statin, Zetia, nitrates, beta-blocker.  Lung mass Recent CT with left lung base masslike rounded density suspicious for atelectasis.  His primary care  provider has arranged for follow-up chest CT in 3 months.  Essential hypertension Fair control.  Continue current therapy.  Continue to monitor.  Aortic atherosclerosis Continue aspirin, statin.  CKD (chronic kidney disease) stage 3, GFR 30-59 ml/min (HCC) Creatinine slightly higher on recent lab work.  Obtain follow-up BMET today.  If creatinine  is further increased, I will adjust his torsemide to 20 mg daily.   Dispo:  Return in about 8 weeks (around 09/01/2017) for Routine Follow Up w/ Dr. Mayford Knife.   Medication Adjustments/Labs and Tests Ordered: Current medicines are reviewed at length with the patient today.  Concerns regarding medicines are outlined above.  Tests Ordered: Orders Placed This Encounter  Procedures  . Basic metabolic panel  . EKG 12-Lead   Medication Changes: No orders of the defined types were placed in this encounter.   Signed, Tereso Newcomer, PA-C  07/07/2017 9:28 AM    [facility] [address] [place], [place], [place]  [address] Phone: [phone]; Fax: [phone]

## 2017-07-06 NOTE — Telephone Encounter (Signed)
Left message on voicemail per DPR in reference to upcoming appointment scheduled on 07/06/17 with detailed instructions given per Myocardial Perfusion Study Information Sheet for the test. LM to arrive 15 minutes early, and that it is imperative to arrive on time for appointment to keep from having the test rescheduled. If you need to cancel or reschedule your appointment, please call the office within 24 hours of your appointment. Failure to do so may result in a cancellation of your appointment, and a $50 no show fee. Phone number given for call back for any questions. James [name] Jacqueline    

## 2017-07-07 ENCOUNTER — Encounter: Payer: Self-pay | Admitting: Physician Assistant

## 2017-07-07 ENCOUNTER — Ambulatory Visit: Payer: Medicare HMO | Admitting: Physician Assistant

## 2017-07-07 ENCOUNTER — Telehealth: Payer: Self-pay | Admitting: *Deleted

## 2017-07-07 VITALS — BP 138/78 | HR 66 | Ht 72.0 in | Wt 262.5 lb

## 2017-07-07 DIAGNOSIS — I2510 Atherosclerotic heart disease of native coronary artery without angina pectoris: Secondary | ICD-10-CM | POA: Diagnosis not present

## 2017-07-07 DIAGNOSIS — N183 Chronic kidney disease, stage 3 unspecified: Secondary | ICD-10-CM

## 2017-07-07 DIAGNOSIS — I10 Essential (primary) hypertension: Secondary | ICD-10-CM | POA: Diagnosis not present

## 2017-07-07 DIAGNOSIS — I5022 Chronic systolic (congestive) heart failure: Secondary | ICD-10-CM | POA: Diagnosis not present

## 2017-07-07 DIAGNOSIS — I700 Atherosclerosis of aorta: Secondary | ICD-10-CM

## 2017-07-07 DIAGNOSIS — R918 Other nonspecific abnormal finding of lung field: Secondary | ICD-10-CM

## 2017-07-07 HISTORY — DX: Atherosclerosis of aorta: I70.0

## 2017-07-07 LAB — BASIC METABOLIC PANEL
BUN/Creatinine Ratio: 15 (ref 10–24)
BUN: 22 mg/dL (ref 8–27)
CALCIUM: 9.6 mg/dL (ref 8.6–10.2)
CO2: 26 mmol/L (ref 20–29)
CREATININE: 1.49 mg/dL — AB (ref 0.76–1.27)
Chloride: 96 mmol/L (ref 96–106)
GFR calc Af Amer: 53 mL/min/{1.73_m2} — ABNORMAL LOW (ref >59)
GFR, EST NON AFRICAN AMERICAN: 46 mL/min/{1.73_m2} — AB (ref >59)
GLUCOSE: 153 mg/dL — AB (ref 65–99)
Potassium: 4.6 mmol/L (ref 3.5–5.2)
SODIUM: 136 mmol/L (ref 134–144)

## 2017-07-07 NOTE — Telephone Encounter (Signed)
Pt has been notified of lab results by phone with verbal understanding. Pt thanked me for my call as well as verified his James [name] is in our office tomorrow 5/22.

## 2017-07-07 NOTE — Patient Instructions (Signed)
Medication Instructions:  Your physician recommends that you continue on your current medications as directed. Please refer to the Current Medication list given to you today.   Labwork: Today BMET  Testing/Procedures: None ordered today  Follow-Up: Follow up on July 18th at 10:00 a.m. with Dr. Mayford Knife.  Any Other Special Instructions Will Be Listed Below (If Applicable).     If you need a refill on your cardiac medications before your next appointment, please call your pharmacy.

## 2017-07-07 NOTE — Telephone Encounter (Signed)
-----   Message from Beatrice Lecher, [place] sent at 07/07/2017  4:48 PM EDT ----- Renal function stable.  Potassium normal.  Glucose elevated. PLAN: Continue current medications and follow up as planned.  Tereso Newcomer, PA-C    07/07/2017 4:47 PM

## 2017-07-08 ENCOUNTER — Ambulatory Visit (HOSPITAL_COMMUNITY): Payer: Medicare HMO | Attending: Cardiology

## 2017-07-08 DIAGNOSIS — L97829 Non-pressure chronic ulcer of other part of left lower leg with unspecified severity: Secondary | ICD-10-CM | POA: Diagnosis not present

## 2017-07-08 DIAGNOSIS — R0602 Shortness of breath: Secondary | ICD-10-CM | POA: Insufficient documentation

## 2017-07-08 DIAGNOSIS — Z7982 Long term (current) use of aspirin: Secondary | ICD-10-CM | POA: Diagnosis not present

## 2017-07-08 DIAGNOSIS — I2510 Atherosclerotic heart disease of native coronary artery without angina pectoris: Secondary | ICD-10-CM | POA: Diagnosis not present

## 2017-07-08 DIAGNOSIS — E119 Type 2 diabetes mellitus without complications: Secondary | ICD-10-CM | POA: Diagnosis not present

## 2017-07-08 DIAGNOSIS — L03116 Cellulitis of left lower limb: Secondary | ICD-10-CM | POA: Diagnosis not present

## 2017-07-08 DIAGNOSIS — K219 Gastro-esophageal reflux disease without esophagitis: Secondary | ICD-10-CM | POA: Diagnosis not present

## 2017-07-08 DIAGNOSIS — I5030 Unspecified diastolic (congestive) heart failure: Secondary | ICD-10-CM | POA: Diagnosis not present

## 2017-07-08 DIAGNOSIS — I110 Hypertensive heart disease with heart failure: Secondary | ICD-10-CM | POA: Diagnosis not present

## 2017-07-08 DIAGNOSIS — Z794 Long term (current) use of insulin: Secondary | ICD-10-CM | POA: Diagnosis not present

## 2017-07-08 MED ORDER — REGADENOSON 0.4 MG/5ML IV SOLN
0.4000 mg | Freq: Once | INTRAVENOUS | Status: AC
  Administered 2017-07-08: 0.4 mg via INTRAVENOUS

## 2017-07-08 MED ORDER — TECHNETIUM TC 99M TETROFOSMIN IV KIT
31.5000 | PACK | Freq: Once | INTRAVENOUS | Status: AC | PRN
  Administered 2017-07-08: 31.5 via INTRAVENOUS
  Filled 2017-07-08: qty 32

## 2017-07-09 ENCOUNTER — Ambulatory Visit (HOSPITAL_COMMUNITY): Payer: Medicare HMO | Attending: Cardiology

## 2017-07-09 LAB — MYOCARDIAL PERFUSION IMAGING
CHL CUP NUCLEAR SRS: 18
CHL CUP NUCLEAR SSS: 28
CHL CUP RESTING HR STRESS: 58 {beats}/min
LV dias vol: 123 mL (ref 62–150)
LVSYSVOL: 58 mL
Peak HR: 74 {beats}/min
RATE: 0.28
SDS: 10
TID: 0.98

## 2017-07-09 MED ORDER — TECHNETIUM TC 99M TETROFOSMIN IV KIT
32.6000 | PACK | Freq: Once | INTRAVENOUS | Status: AC | PRN
  Administered 2017-07-09: 32.6 via INTRAVENOUS
  Filled 2017-07-09: qty 33

## 2017-07-09 MED FILL — PRAMIPEXOLE 0.75 MG TABLET: 0.75 | 30 days supply | Qty: 60 | Fill #1

## 2017-07-14 ENCOUNTER — Telehealth: Payer: Self-pay

## 2017-07-14 ENCOUNTER — Other Ambulatory Visit: Payer: Self-pay | Admitting: Cardiology

## 2017-07-14 DIAGNOSIS — Z01810 Encounter for preprocedural cardiovascular examination: Secondary | ICD-10-CM

## 2017-07-14 NOTE — Telephone Encounter (Signed)
Notes recorded by Phineas Semen, RN on 07/14/2017 at 3:28 PM EDT Patient scheduled for right and left heart cath with Dr. Eldridge Dace on 07/20/17 at 7:30AM. Patient in agreement with plan. Pt scheduled for CBC, and PT/INR on Friday 07/17/17. Pt stated understanding and thankful for the call  Notes recorded by Phineas Semen, RN on 07/14/2017 at 3:15 PM EDT Patient made aware of stress test results. Pt informed Dr. Mayford Knife recommends a left and right heart cath due to abnormal stress test results. Patient denies chest pain and sob. He states he has diarrhea and would like to schedule heart cath for early next week. Informed patient I would set cath for Monday July 20, 2017. Pt in agreement with plan and thankful for the call.  Notes recorded by Quintella Reichert, MD on 07/13/2017 at 7:28 PM EDT Abnormal stress test with anteroapical ischemia and EF now reduced by echo and stress test. Please set patient up for right and left heart cath to assess coronary anatomy

## 2017-07-14 NOTE — Telephone Encounter (Signed)
[facility] instructions reviewed with patient. Pt informed instructions will be at the front desk for pick up during lab appt. He stated understanding and thankful for the call

## 2017-07-15 DIAGNOSIS — I2510 Atherosclerotic heart disease of native coronary artery without angina pectoris: Secondary | ICD-10-CM | POA: Diagnosis not present

## 2017-07-15 DIAGNOSIS — Z7982 Long term (current) use of aspirin: Secondary | ICD-10-CM | POA: Diagnosis not present

## 2017-07-15 DIAGNOSIS — L97829 Non-pressure chronic ulcer of other part of left lower leg with unspecified severity: Secondary | ICD-10-CM | POA: Diagnosis not present

## 2017-07-15 DIAGNOSIS — K219 Gastro-esophageal reflux disease without esophagitis: Secondary | ICD-10-CM | POA: Diagnosis not present

## 2017-07-15 DIAGNOSIS — E119 Type 2 diabetes mellitus without complications: Secondary | ICD-10-CM | POA: Diagnosis not present

## 2017-07-15 DIAGNOSIS — Z794 Long term (current) use of insulin: Secondary | ICD-10-CM | POA: Diagnosis not present

## 2017-07-15 DIAGNOSIS — I110 Hypertensive heart disease with heart failure: Secondary | ICD-10-CM | POA: Diagnosis not present

## 2017-07-15 DIAGNOSIS — I5030 Unspecified diastolic (congestive) heart failure: Secondary | ICD-10-CM | POA: Diagnosis not present

## 2017-07-15 DIAGNOSIS — L03116 Cellulitis of left lower limb: Secondary | ICD-10-CM | POA: Diagnosis not present

## 2017-07-16 ENCOUNTER — Telehealth: Payer: Self-pay | Admitting: Interventional Cardiology

## 2017-07-16 NOTE — Telephone Encounter (Signed)
New Message:        Pt states he has some kind of virus that is upsetting his stomach he is thinking he may need to reschedule for his blood work and his procedure he is due to have on monday

## 2017-07-16 NOTE — Telephone Encounter (Signed)
Pt states he has diarrhea and if symptoms do not improve he will not make it to his lab appt tomorrow. Pt states he will wait till tomorrow to see if symptoms improve. I informed patient if he is able to make it cath instructions will be available for pick up at the front desk. He states understanding and thankful for the call

## 2017-07-17 ENCOUNTER — Telehealth: Payer: Self-pay | Admitting: Cardiology

## 2017-07-17 ENCOUNTER — Telehealth: Payer: Self-pay | Admitting: Interventional Cardiology

## 2017-07-17 DIAGNOSIS — Z79899 Other long term (current) drug therapy: Secondary | ICD-10-CM

## 2017-07-17 NOTE — Telephone Encounter (Signed)
Pt states he has really bad diarrhea and he would like to reschedule his heart cath for Friday 07/24/17. Pt is rescheduled with Dr. Okey Dupre on 07/24/17 at 7:30. Pt informed to come in for labs on 07/22/17. He stated understanding and thankful for the call

## 2017-07-17 NOTE — Telephone Encounter (Signed)
New Message    Patient is calling due to him having diarrhea and not being able to do his lab work for his cath. He advised that he may need to reschedule. Please call

## 2017-07-21 DIAGNOSIS — I5030 Unspecified diastolic (congestive) heart failure: Secondary | ICD-10-CM | POA: Diagnosis not present

## 2017-07-21 DIAGNOSIS — K219 Gastro-esophageal reflux disease without esophagitis: Secondary | ICD-10-CM | POA: Diagnosis not present

## 2017-07-21 DIAGNOSIS — L97829 Non-pressure chronic ulcer of other part of left lower leg with unspecified severity: Secondary | ICD-10-CM | POA: Diagnosis not present

## 2017-07-21 DIAGNOSIS — E119 Type 2 diabetes mellitus without complications: Secondary | ICD-10-CM | POA: Diagnosis not present

## 2017-07-21 DIAGNOSIS — I2510 Atherosclerotic heart disease of native coronary artery without angina pectoris: Secondary | ICD-10-CM | POA: Diagnosis not present

## 2017-07-21 DIAGNOSIS — I110 Hypertensive heart disease with heart failure: Secondary | ICD-10-CM | POA: Diagnosis not present

## 2017-07-21 DIAGNOSIS — Z7982 Long term (current) use of aspirin: Secondary | ICD-10-CM | POA: Diagnosis not present

## 2017-07-21 DIAGNOSIS — Z794 Long term (current) use of insulin: Secondary | ICD-10-CM | POA: Diagnosis not present

## 2017-07-21 DIAGNOSIS — L03116 Cellulitis of left lower limb: Secondary | ICD-10-CM | POA: Diagnosis not present

## 2017-07-22 ENCOUNTER — Other Ambulatory Visit: Payer: Medicare HMO | Admitting: *Deleted

## 2017-07-22 ENCOUNTER — Encounter (INDEPENDENT_AMBULATORY_CARE_PROVIDER_SITE_OTHER): Payer: Self-pay

## 2017-07-22 DIAGNOSIS — Z79899 Other long term (current) drug therapy: Secondary | ICD-10-CM

## 2017-07-22 LAB — BASIC METABOLIC PANEL
BUN/Creatinine Ratio: 10 (ref 10–24)
BUN: 13 mg/dL (ref 8–27)
CALCIUM: 8.5 mg/dL — AB (ref 8.6–10.2)
CO2: 22 mmol/L (ref 20–29)
CREATININE: 1.29 mg/dL — AB (ref 0.76–1.27)
Chloride: 102 mmol/L (ref 96–106)
GFR calc Af Amer: 64 mL/min/{1.73_m2} (ref >59)
GFR, EST NON AFRICAN AMERICAN: 55 mL/min/{1.73_m2} — AB (ref >59)
GLUCOSE: 105 mg/dL — AB (ref 65–99)
Potassium: 3.4 mmol/L — ABNORMAL LOW (ref 3.5–5.2)
Sodium: 139 mmol/L (ref 134–144)

## 2017-07-22 LAB — CBC
HEMATOCRIT: 39 % (ref 37.5–51.0)
HEMOGLOBIN: 13.5 g/dL (ref 13.0–17.7)
MCH: 29.1 pg (ref 26.6–33.0)
MCHC: 34.6 g/dL (ref 31.5–35.7)
MCV: 84 fL (ref 79–97)
Platelets: 270 10*3/uL (ref 150–450)
RBC: 4.64 x10E6/uL (ref 4.14–5.80)
RDW: 13.7 % (ref 12.3–15.4)
WBC: 12.8 10*3/uL — ABNORMAL HIGH (ref 3.4–10.8)

## 2017-07-23 ENCOUNTER — Telehealth: Payer: Self-pay | Admitting: Cardiology

## 2017-07-23 ENCOUNTER — Telehealth: Payer: Self-pay | Admitting: *Deleted

## 2017-07-23 NOTE — Telephone Encounter (Signed)
Pt [facility]: Friday July 24, 2017 7:30 AM [facility] at: 5:30 AM  No solid food after midnight prior to cath, clear liquids until 5 AM day of procedure. Verified allergies in Epic.  Pt knows not to take any diabetes medications AM of cath and 1/2 of usual Lantus insulin dose PM prior to cath.  AM meds can be  taken pre-cath with sip of water including: ASA 81 mg  Confirmed patient has responsible person to drive home post procedure and observe patient for 24 hours: yes   NOTE:  Pt states he had diarrhea and was advised by his PCP about 10 days ago  to stop all medications to see if that helped.  Pt states he has been off all medications for about 10 days except mirapex that he takes for restless leg syndrome and he uses insulin if his blood sugar is greater than 120. Pt states he  did start ASA 81 mg yesterday and knows to take it tomorrow morning before he goes to hospital for procedure.  Pt states his shortness of breath has improved even though he has not been taking torsemide and denies increase in his weight.  Pt does states his diarrhea has improved, he is having about 1 loose stool per day and is aware he will not be  able to use the restroom at times during the day of the procedure.  Pt states that should not be a problem.   I discussed with Dr End, he will proceed with cath tomorrow. I will forward to Dr Mayford Knifeurner for review.

## 2017-07-23 NOTE — Telephone Encounter (Signed)
Pt is scheduled for a cardiac cath on tomorrow 07/24/17. With Dr End. Pt called to get the pre-op cardiac cath instructions. Went over the pre-cath instructions with pt. Pt verbalized understanding.

## 2017-07-23 NOTE — Telephone Encounter (Signed)
New message:       Pt is calling and states he has not received any instructions on what to do for his procedure on tomorrow

## 2017-07-24 ENCOUNTER — Emergency Department (HOSPITAL_BASED_OUTPATIENT_CLINIC_OR_DEPARTMENT_OTHER): Payer: Medicare HMO

## 2017-07-24 ENCOUNTER — Encounter (HOSPITAL_BASED_OUTPATIENT_CLINIC_OR_DEPARTMENT_OTHER): Payer: Self-pay | Admitting: Emergency Medicine

## 2017-07-24 ENCOUNTER — Ambulatory Visit (HOSPITAL_COMMUNITY)
Admission: RE | Admit: 2017-07-24 | Discharge: 2017-07-24 | Disposition: A | Discharge status: Home / Self Care | Payer: Medicare HMO | Source: Ambulatory Visit | Attending: Internal Medicine | Admitting: Internal Medicine

## 2017-07-24 ENCOUNTER — Emergency Department: Payer: Medicare HMO

## 2017-07-24 ENCOUNTER — Inpatient Hospital Stay (HOSPITAL_BASED_OUTPATIENT_CLINIC_OR_DEPARTMENT_OTHER)
Admission: EM | Admit: 2017-07-24 | Discharge: 2017-07-30 | DRG: 392 | Disposition: A | Discharge status: Home Health | Payer: Medicare HMO | Attending: Family Medicine | Admitting: Family Medicine

## 2017-07-24 ENCOUNTER — Other Ambulatory Visit: Payer: Self-pay

## 2017-07-24 ENCOUNTER — Encounter (HOSPITAL_COMMUNITY)
Admission: RE | Disposition: A | Discharge status: Home / Self Care | Payer: Self-pay | Source: Ambulatory Visit | Attending: Internal Medicine

## 2017-07-24 DIAGNOSIS — E119 Type 2 diabetes mellitus without complications: Secondary | ICD-10-CM

## 2017-07-24 DIAGNOSIS — E1122 Type 2 diabetes mellitus with diabetic chronic kidney disease: Secondary | ICD-10-CM | POA: Diagnosis not present

## 2017-07-24 DIAGNOSIS — R197 Diarrhea, unspecified: Secondary | ICD-10-CM

## 2017-07-24 DIAGNOSIS — L03116 Cellulitis of left lower limb: Secondary | ICD-10-CM | POA: Diagnosis not present

## 2017-07-24 DIAGNOSIS — Z8619 Personal history of other infectious and parasitic diseases: Secondary | ICD-10-CM

## 2017-07-24 DIAGNOSIS — K5792 Diverticulitis of intestine, part unspecified, without perforation or abscess without bleeding: Secondary | ICD-10-CM

## 2017-07-24 DIAGNOSIS — I2510 Atherosclerotic heart disease of native coronary artery without angina pectoris: Secondary | ICD-10-CM

## 2017-07-24 DIAGNOSIS — Z8249 Family history of ischemic heart disease and other diseases of the circulatory system: Secondary | ICD-10-CM

## 2017-07-24 DIAGNOSIS — E785 Hyperlipidemia, unspecified: Secondary | ICD-10-CM

## 2017-07-24 DIAGNOSIS — D72829 Elevated white blood cell count, unspecified: Secondary | ICD-10-CM

## 2017-07-24 DIAGNOSIS — R000 Tachycardia, unspecified: Secondary | ICD-10-CM | POA: Diagnosis not present

## 2017-07-24 DIAGNOSIS — N179 Acute kidney failure, unspecified: Secondary | ICD-10-CM | POA: Diagnosis not present

## 2017-07-24 DIAGNOSIS — R509 Fever, unspecified: Secondary | ICD-10-CM | POA: Insufficient documentation

## 2017-07-24 DIAGNOSIS — J90 Pleural effusion, not elsewhere classified: Secondary | ICD-10-CM | POA: Diagnosis not present

## 2017-07-24 DIAGNOSIS — Z6837 Body mass index (BMI) 37.0-37.9, adult: Secondary | ICD-10-CM | POA: Diagnosis not present

## 2017-07-24 DIAGNOSIS — Z951 Presence of aortocoronary bypass graft: Secondary | ICD-10-CM

## 2017-07-24 DIAGNOSIS — E876 Hypokalemia: Secondary | ICD-10-CM | POA: Diagnosis not present

## 2017-07-24 DIAGNOSIS — M79662 Pain in left lower leg: Secondary | ICD-10-CM | POA: Diagnosis not present

## 2017-07-24 DIAGNOSIS — I110 Hypertensive heart disease with heart failure: Secondary | ICD-10-CM

## 2017-07-24 DIAGNOSIS — E1159 Type 2 diabetes mellitus with other circulatory complications: Secondary | ICD-10-CM | POA: Diagnosis present

## 2017-07-24 DIAGNOSIS — I429 Cardiomyopathy, unspecified: Secondary | ICD-10-CM | POA: Insufficient documentation

## 2017-07-24 DIAGNOSIS — I700 Atherosclerosis of aorta: Secondary | ICD-10-CM | POA: Diagnosis present

## 2017-07-24 DIAGNOSIS — I5032 Chronic diastolic (congestive) heart failure: Secondary | ICD-10-CM | POA: Insufficient documentation

## 2017-07-24 DIAGNOSIS — I4892 Unspecified atrial flutter: Secondary | ICD-10-CM

## 2017-07-24 DIAGNOSIS — N183 Chronic kidney disease, stage 3 unspecified: Secondary | ICD-10-CM | POA: Diagnosis present

## 2017-07-24 DIAGNOSIS — E669 Obesity, unspecified: Secondary | ICD-10-CM | POA: Diagnosis present

## 2017-07-24 DIAGNOSIS — D509 Iron deficiency anemia, unspecified: Secondary | ICD-10-CM | POA: Diagnosis present

## 2017-07-24 DIAGNOSIS — R0602 Shortness of breath: Secondary | ICD-10-CM

## 2017-07-24 DIAGNOSIS — Z794 Long term (current) use of insulin: Secondary | ICD-10-CM | POA: Insufficient documentation

## 2017-07-24 DIAGNOSIS — K5732 Diverticulitis of large intestine without perforation or abscess without bleeding: Secondary | ICD-10-CM | POA: Diagnosis not present

## 2017-07-24 DIAGNOSIS — Z7982 Long term (current) use of aspirin: Secondary | ICD-10-CM

## 2017-07-24 DIAGNOSIS — Z823 Family history of stroke: Secondary | ICD-10-CM

## 2017-07-24 DIAGNOSIS — K219 Gastro-esophageal reflux disease without esophagitis: Secondary | ICD-10-CM | POA: Insufficient documentation

## 2017-07-24 DIAGNOSIS — R609 Edema, unspecified: Secondary | ICD-10-CM | POA: Diagnosis not present

## 2017-07-24 DIAGNOSIS — I130 Hypertensive heart and chronic kidney disease with heart failure and stage 1 through stage 4 chronic kidney disease, or unspecified chronic kidney disease: Secondary | ICD-10-CM | POA: Diagnosis not present

## 2017-07-24 DIAGNOSIS — A419 Sepsis, unspecified organism: Secondary | ICD-10-CM | POA: Diagnosis not present

## 2017-07-24 DIAGNOSIS — I712 Thoracic aortic aneurysm, without rupture: Secondary | ICD-10-CM | POA: Insufficient documentation

## 2017-07-24 DIAGNOSIS — I10 Essential (primary) hypertension: Secondary | ICD-10-CM | POA: Diagnosis not present

## 2017-07-24 DIAGNOSIS — R9439 Abnormal result of other cardiovascular function study: Secondary | ICD-10-CM

## 2017-07-24 DIAGNOSIS — Z872 Personal history of diseases of the skin and subcutaneous tissue: Secondary | ICD-10-CM

## 2017-07-24 DIAGNOSIS — Z5309 Procedure and treatment not carried out because of other contraindication: Secondary | ICD-10-CM | POA: Insufficient documentation

## 2017-07-24 DIAGNOSIS — I152 Hypertension secondary to endocrine disorders: Secondary | ICD-10-CM | POA: Diagnosis present

## 2017-07-24 DIAGNOSIS — J929 Pleural plaque without asbestos: Secondary | ICD-10-CM | POA: Diagnosis present

## 2017-07-24 DIAGNOSIS — Z888 Allergy status to other drugs, medicaments and biological substances status: Secondary | ICD-10-CM

## 2017-07-24 DIAGNOSIS — I5022 Chronic systolic (congestive) heart failure: Secondary | ICD-10-CM | POA: Diagnosis present

## 2017-07-24 DIAGNOSIS — L0390 Cellulitis, unspecified: Secondary | ICD-10-CM | POA: Diagnosis not present

## 2017-07-24 DIAGNOSIS — R1030 Lower abdominal pain, unspecified: Secondary | ICD-10-CM | POA: Diagnosis not present

## 2017-07-24 DIAGNOSIS — E1165 Type 2 diabetes mellitus with hyperglycemia: Secondary | ICD-10-CM | POA: Diagnosis not present

## 2017-07-24 DIAGNOSIS — Z79899 Other long term (current) drug therapy: Secondary | ICD-10-CM

## 2017-07-24 DIAGNOSIS — M7989 Other specified soft tissue disorders: Secondary | ICD-10-CM | POA: Diagnosis not present

## 2017-07-24 HISTORY — DX: Pleural plaque without asbestos: J92.9

## 2017-07-24 HISTORY — DX: Diverticulitis of large intestine without perforation or abscess without bleeding: K57.32

## 2017-07-24 HISTORY — DX: Chronic kidney disease, stage 3 unspecified: N18.30

## 2017-07-24 HISTORY — DX: Chronic systolic (congestive) heart failure: I50.22

## 2017-07-24 LAB — URINALYSIS, ROUTINE W REFLEX MICROSCOPIC
BILIRUBIN URINE: NEGATIVE
Glucose, UA: NEGATIVE mg/dL
KETONES UR: 15 mg/dL — AB
LEUKOCYTES UA: NEGATIVE
NITRITE: NEGATIVE
PH: 5.5 (ref 5.0–8.0)
Protein, ur: 100 mg/dL — AB
SPECIFIC GRAVITY, URINE: 1.025 (ref 1.005–1.030)

## 2017-07-24 LAB — CBC WITH DIFFERENTIAL/PLATELET
BASOS ABS: 0 10*3/uL (ref 0.0–0.1)
Basophils Relative: 0 %
Eosinophils Absolute: 0.2 10*3/uL (ref 0.0–0.7)
Eosinophils Relative: 1 %
HCT: 38 % — ABNORMAL LOW (ref 39.0–52.0)
HEMOGLOBIN: 13.2 g/dL (ref 13.0–17.0)
LYMPHS ABS: 1.2 10*3/uL (ref 0.7–4.0)
LYMPHS PCT: 6 %
MCH: 28.8 pg (ref 26.0–34.0)
MCHC: 34.7 g/dL (ref 30.0–36.0)
MCV: 82.8 fL (ref 78.0–100.0)
Monocytes Absolute: 1.7 10*3/uL — ABNORMAL HIGH (ref 0.1–1.0)
Monocytes Relative: 9 %
NEUTROS ABS: 16.4 10*3/uL — AB (ref 1.7–7.7)
NEUTROS PCT: 84 %
PLATELETS: 267 10*3/uL (ref 150–400)
RBC: 4.59 MIL/uL (ref 4.22–5.81)
RDW: 13.4 % (ref 11.5–15.5)
WBC: 19.5 10*3/uL — AB (ref 4.0–10.5)

## 2017-07-24 LAB — COMPREHENSIVE METABOLIC PANEL
ALT: 18 U/L (ref 17–63)
AST: 22 U/L (ref 15–41)
Albumin: 3.2 g/dL — ABNORMAL LOW (ref 3.5–5.0)
Alkaline Phosphatase: 89 U/L (ref 38–126)
Anion gap: 10 (ref 5–15)
BUN: 12 mg/dL (ref 6–20)
CHLORIDE: 103 mmol/L (ref 101–111)
CO2: 24 mmol/L (ref 22–32)
Calcium: 8 mg/dL — ABNORMAL LOW (ref 8.9–10.3)
Creatinine, Ser: 1.34 mg/dL — ABNORMAL HIGH (ref 0.61–1.24)
GFR, EST AFRICAN AMERICAN: 59 mL/min — AB (ref >60)
GFR, EST NON AFRICAN AMERICAN: 51 mL/min — AB (ref >60)
Glucose, Bld: 150 mg/dL — ABNORMAL HIGH (ref 65–99)
POTASSIUM: 3.2 mmol/L — AB (ref 3.5–5.1)
SODIUM: 137 mmol/L (ref 135–145)
Total Bilirubin: 0.9 mg/dL (ref 0.3–1.2)
Total Protein: 7.5 g/dL (ref 6.5–8.1)

## 2017-07-24 LAB — POTASSIUM: POTASSIUM: 3.4 mmol/L — AB (ref 3.5–5.1)

## 2017-07-24 LAB — PROTIME-INR
INR: 1.18
PROTHROMBIN TIME: 14.9 s (ref 11.4–15.2)

## 2017-07-24 LAB — URINALYSIS, MICROSCOPIC (REFLEX)

## 2017-07-24 LAB — GLUCOSE, CAPILLARY: Glucose-Capillary: 161 mg/dL — ABNORMAL HIGH (ref 65–99)

## 2017-07-24 LAB — I-STAT CG4 LACTIC ACID, ED: LACTIC ACID, VENOUS: 1.71 mmol/L (ref 0.5–1.9)

## 2017-07-24 SURGERY — RIGHT/LEFT HEART CATH AND CORONARY/GRAFT ANGIOGRAPHY
Anesthesia: LOCAL

## 2017-07-24 MED ORDER — HYDROCODONE-ACETAMINOPHEN 5-325 MG PO TABS
1.0000 | ORAL_TABLET | ORAL | Status: DC | PRN
  Administered 2017-07-26: 1 via ORAL
  Filled 2017-07-24: qty 2
  Filled 2017-07-24: qty 1

## 2017-07-24 MED ORDER — PRAMIPEXOLE DIHYDROCHLORIDE 1 MG PO TABS
1.5000 mg | ORAL_TABLET | Freq: Every day | ORAL | Status: DC
  Administered 2017-07-25 – 2017-07-29 (×6): 1.5 mg via ORAL
  Filled 2017-07-24 (×7): qty 2

## 2017-07-24 MED ORDER — ONDANSETRON HCL 4 MG/2ML IJ SOLN
4.0000 mg | Freq: Four times a day (QID) | INTRAMUSCULAR | Status: DC | PRN
  Administered 2017-07-25 – 2017-07-27 (×4): 4 mg via INTRAVENOUS
  Filled 2017-07-24 (×4): qty 2

## 2017-07-24 MED ORDER — ACETAMINOPHEN 325 MG PO TABS
650.0000 mg | ORAL_TABLET | Freq: Four times a day (QID) | ORAL | Status: DC | PRN
  Administered 2017-07-25 – 2017-07-26 (×3): 650 mg via ORAL
  Filled 2017-07-24 (×3): qty 2

## 2017-07-24 MED ORDER — ENOXAPARIN SODIUM 40 MG/0.4ML ~~LOC~~ SOLN
40.0000 mg | SUBCUTANEOUS | Status: DC
  Administered 2017-07-24 – 2017-07-29 (×6): 40 mg via SUBCUTANEOUS
  Filled 2017-07-24 (×6): qty 0.4

## 2017-07-24 MED ORDER — SODIUM CHLORIDE 0.9% FLUSH: 3.0000 mL | Freq: Two times a day (BID) | INTRAVENOUS | Status: DC

## 2017-07-24 MED ORDER — ASPIRIN 81 MG PO CHEW: 81.0000 mg | CHEWABLE_TABLET | ORAL | Status: DC

## 2017-07-24 MED ORDER — ONDANSETRON HCL 4 MG/2ML IJ SOLN
4.0000 mg | Freq: Once | INTRAMUSCULAR | Status: AC
  Administered 2017-07-24: 4 mg via INTRAVENOUS
  Filled 2017-07-24: qty 2

## 2017-07-24 MED ORDER — SODIUM CHLORIDE 0.9 % IV SOLN: 250.0000 mL | INTRAVENOUS | Status: DC | PRN

## 2017-07-24 MED ORDER — POTASSIUM CHLORIDE CRYS ER 20 MEQ PO TBCR
20.0000 meq | EXTENDED_RELEASE_TABLET | Freq: Once | ORAL | Status: AC
  Administered 2017-07-24: 20 meq via ORAL
  Filled 2017-07-24: qty 1

## 2017-07-24 MED ORDER — SODIUM CHLORIDE 0.9% FLUSH: 3.0000 mL | INTRAVENOUS | Status: DC | PRN

## 2017-07-24 MED ORDER — SODIUM CHLORIDE 0.9% FLUSH
3.0000 mL | Freq: Two times a day (BID) | INTRAVENOUS | Status: DC
  Administered 2017-07-25 – 2017-07-30 (×5): 3 mL via INTRAVENOUS

## 2017-07-24 MED ORDER — PIPERACILLIN-TAZOBACTAM 3.375 G IVPB 30 MIN
3.3750 g | Freq: Once | INTRAVENOUS | Status: DC
  Filled 2017-07-24: qty 50

## 2017-07-24 MED ORDER — MORPHINE SULFATE (PF) 4 MG/ML IV SOLN
4.0000 mg | Freq: Once | INTRAVENOUS | Status: AC
  Administered 2017-07-24: 4 mg via INTRAVENOUS
  Filled 2017-07-24: qty 1

## 2017-07-24 MED ORDER — SODIUM CHLORIDE 0.9 % IV BOLUS
1000.0000 mL | Freq: Once | INTRAVENOUS | Status: AC
  Administered 2017-07-24: 1000 mL via INTRAVENOUS

## 2017-07-24 MED ORDER — IOPAMIDOL (ISOVUE-300) INJECTION 61%
100.0000 mL | Freq: Once | INTRAVENOUS | Status: AC | PRN
  Administered 2017-07-24: 100 mL via INTRAVENOUS

## 2017-07-24 MED ORDER — SODIUM CHLORIDE 0.9 % WEIGHT BASED INFUSION: 1.0000 mL/kg/h | INTRAVENOUS | Status: DC

## 2017-07-24 MED ORDER — PIPERACILLIN-TAZOBACTAM 3.375 G IVPB
3.3750 g | Freq: Once | INTRAVENOUS | Status: AC
  Administered 2017-07-24: 3.375 g via INTRAVENOUS
  Filled 2017-07-24: qty 50

## 2017-07-24 MED ORDER — PIPERACILLIN-TAZOBACTAM 3.375 G IVPB
3.3750 g | Freq: Three times a day (TID) | INTRAVENOUS | Status: DC
Start: 2017-07-24 — End: 2017-07-29
  Administered 2017-07-25 – 2017-07-29 (×14): 3.375 g via INTRAVENOUS
  Filled 2017-07-24 (×14): qty 50

## 2017-07-24 MED ORDER — SODIUM CHLORIDE 0.9 % WEIGHT BASED INFUSION: 3.0000 mL/kg/h | INTRAVENOUS | Status: DC

## 2017-07-24 MED ORDER — ONDANSETRON HCL 4 MG PO TABS: 4.0000 mg | ORAL_TABLET | Freq: Four times a day (QID) | ORAL | Status: DC | PRN

## 2017-07-24 MED ORDER — ASPIRIN EC 81 MG PO TBEC
81.0000 mg | DELAYED_RELEASE_TABLET | Freq: Every day | ORAL | Status: DC
  Administered 2017-07-24 – 2017-07-29 (×6): 81 mg via ORAL
  Filled 2017-07-24 (×6): qty 1

## 2017-07-24 MED ORDER — SODIUM CHLORIDE 0.9% FLUSH
3.0000 mL | Freq: Two times a day (BID) | INTRAVENOUS | Status: DC
  Administered 2017-07-28 – 2017-07-30 (×4): 3 mL via INTRAVENOUS

## 2017-07-24 MED ORDER — ACETAMINOPHEN 325 MG PO TABS
650.0000 mg | ORAL_TABLET | Freq: Once | ORAL | Status: AC
  Administered 2017-07-24: 650 mg via ORAL
  Filled 2017-07-24: qty 2

## 2017-07-24 MED ORDER — ACETAMINOPHEN 650 MG RE SUPP: 650.0000 mg | Freq: Four times a day (QID) | RECTAL | Status: DC | PRN

## 2017-07-24 NOTE — ED Notes (Signed)
Attempted report 

## 2017-07-24 NOTE — ED Notes (Signed)
Patient is resting comfortably. 

## 2017-07-24 NOTE — H&P (Signed)
History and Physical    OTHER James [name]:[id number] DOB: 08/26/1945 DOA: 07/24/2017  PCP: James Rua, MD   Patient coming from: Home, by way of [facility] ED   Chief Complaint: lower abd pain, fever   HPI: James [name] is a 72 y.o. male with medical history significant for coronary artery disease, chronic systolic CHF, hypertension, and recent lower extremity cellulitis treated with antibiotics and complicated by diarrhea which has since resolved, now presenting to the emergency department for evaluation of fevers and lower abdominal discomfort.  The patient has been experiencing fatigue recently, prompting outpatient stress test that was found to be abnormal, and he presented to the hospital today for coronary catheterization.  He was found to be febrile, was complaining of lower abdominal pain, labs were collected and he was noted to have a marked leukocytosis, catheterization was canceled, and he was directed to the ED.  Patient denies melena or hematochezia, reports that he was recently experiencing diarrhea, but that has essentially resolved and he is now having approximately 1 loose stool daily.  He denies chest pain, palpitations, shortness breath, or cough.  Reports that his chronic bilateral lower extremity edema is better than usual.  ED Course: Upon arrival to the ED, patient is found to be febrile to 38.8 C, saturating well on room air, slightly tachycardic, and with stable blood pressure.  EKG features a sinus tachycardia with rate 100 and PAC.  Chest x-ray is notable for stable cardiomegaly and mild interstitial prominence concerning for mild CHF or atypical pneumonia.  CT of the abdomen and pelvis reveals sigmoid diverticulitis without abscess or perforation.  Also noted on the CT is small left pleural effusion with marked pleural thickening concerning for possible infection versus neoplasm.  Chemistry panel reveals a potassium 3.2 and creatinine 1.34, consistent with his apparent  baseline.  CBC is notable for leukocytosis to 19,500 and lactic acid is reassuringly normal.  Blood cultures were collected, 1 L of normal saline given, and the patient was treated with Zosyn in the ED.  Tachycardia resolved, blood pressure remained stable, and the patient will be admitted for ongoing evaluation and management of acute sigmoid diverticulitis.  Review of Systems:  All other systems reviewed and apart from HPI, are negative.  Past Medical History:  Diagnosis Date  . Aortic atherosclerosis (HCC) 07/07/2017  . Ascending aortic aneurysm (HCC)    cMRI (2/15): Normal LV EF 54%, Mild BAE, Trileaflet Aortic Valve, Upper limits of normal ascending aorta 3.8 cm, Normal aortic arch 2.3 cm with bovine origin of left carotid  . Atrial flutter (HCC)    Post-op with no reoccurence  . Chronic diastolic heart failure (HCC)    Echo (1/15): Left ventricle: The cavity size was mildly dilated. Mild LVH. EF 55% to 60%. Wall motion was normal; Gr 1 diastolic dysfunction Left atrium: The atrium was mildly dilated. Right atrium: The atrium was mildly to moderately dilated. Aortic arch measures 4.8 cm (moderately dilated); suggest CTA or MRA to better assess.  . Chronic systolic CHF (congestive heart failure) (HCC) 07/24/2017  . Coronary artery disease 10/2011   a. s/p cabg;  b. Myoview (9/15):  ant ischemia, poss TID, EF 43%, high risk >> LHC (9/15): Dist LM 50, pLAD 90 then 100, pCFX 50, mCFX 75, oRCA 75, dRCA 95, L-LAD patent, S-Dx 100, S-OM1 patent, S-PDA patent, EF 55% >> native Dx small caliber and not well suited for PCI; native RCA amenable to PCI and would restore flow to PLA branches  but would jeopardize SVG-RCA - Med Rx  . Deviated nasal septum   . Diabetes mellitus    onset 2013  . Dyslipidemia   . GERD (gastroesophageal reflux disease)   . Hx of Doppler ultrasound    Carotid US (9/13): No ICA stenosis  . Hypertension     Past Surgical History:  Procedure Laterality Date  .  CARDIAC CATHETERIZATION    . CORONARY ARTERY BYPASS GRAFT  10/27/2011   Procedure: CORONARY ARTERY BYPASS GRAFTING (CABG);  Surgeon: Kerin Perna, MD;  Location: [facility] OR;  Service: Open Heart Surgery;  Laterality: N/A;  Coronary Artery Bypass Grafting times four using left internal mammary artery and right greater saphenous vein endoscopically harvested  . LEFT HEART CATHETERIZATION WITH CORONARY ANGIOGRAM N/A 10/23/2011   Procedure: LEFT HEART CATHETERIZATION WITH CORONARY ANGIOGRAM;  Surgeon: Quintella Reichert, MD;  Location: [facility] CATH LAB;  Service: Cardiovascular;  Laterality: N/A;  . LEFT HEART CATHETERIZATION WITH CORONARY ANGIOGRAM N/A 11/16/2013   Procedure: LEFT HEART CATHETERIZATION WITH CORONARY ANGIOGRAM;  Surgeon: Micheline Chapman, MD;  Location: [facility] CATH LAB;  Service: Cardiovascular;  Laterality: N/A;  . left knee arthroscopy  2003  . right knee arthroscopy  2006     reports that he has never smoked. He has never used smokeless tobacco. He reports that he drinks alcohol. He reports that he does not use drugs.  Allergies  Allergen Reactions  . Lisinopril Cough    Family History  Problem Relation Age of Onset  . Hypertension Mother   . Stroke Father   . Hypertension Father   . Hypertension Paternal Grandfather   . Heart attack Neg Hx      Prior to Admission medications   Medication Sig Start Date End Date Taking? Authorizing Provider  aspirin 81 MG tablet Take 81 mg by mouth at bedtime.    Yes [provider]  ibuprofen (ADVIL,MOTRIN) 200 MG tablet Take 200 mg by mouth every 6 (six) hours as needed for headache or moderate pain.   Yes [provider]  loperamide (IMODIUM A-D) 2 MG tablet Take 2 mg by mouth 4 (four) times daily as needed for diarrhea or loose stools.   Yes [provider]  pramipexole (MIRAPEX) 0.75 MG tablet Take 1.5 mg by mouth at bedtime.    Yes [provider]    Physical Exam: Vitals:   07/24/17 1653 07/24/17 1700  07/24/17 1730 07/24/17 1842  BP:  (!) 148/78 139/80 138/64  Pulse:  91 89 84  Resp:  17 (!) 30 20  Temp: 100.2 F (37.9 C)   99.4 F (37.4 C)  TempSrc: Oral   Oral  SpO2:  96% 93% 96%  Weight:      Height:    5\' 9"  (1.753 m)      Constitutional: NAD, calm  Eyes: PERTLA, lids and conjunctivae normal ENMT: Mucous membranes are moist. Posterior pharynx clear of any exudate or lesions.   Neck: normal, supple, no masses, no thyromegaly Respiratory: Breath sounds slightly diminished, no wheezing, no crackles. Normal respiratory effort.    Cardiovascular: S1 & S2 heard, regular rate and rhythm. Pretibial pitting edema bilatearlly. Abdomen: No distension, soft, tender in lower quadrants without rebound pain or guarding. Bowel sounds active.  Musculoskeletal: no clubbing / cyanosis. No joint deformity upper and lower extremities.    Skin: no significant rashes, lesions, ulcers. Warm, dry, well-perfused. Neurologic: CN 2-12 grossly intact. Sensation intact. Strength 5/5 in all 4 limbs.  Psychiatric: Alert and  oriented x 3. Pleasant and cooperative.     Labs on Admission: I have personally reviewed following labs and imaging studies  CBC: Recent Labs  Lab 07/22/17 1031 07/24/17 1018  WBC 12.8* 19.5*  NEUTROABS  --  16.4*  HGB 13.5 13.2  HCT 39.0 38.0*  MCV 84 82.8  PLT 270 267   Basic Metabolic Panel: Recent Labs  Lab 07/22/17 1031 07/24/17 0627 07/24/17 1018  NA 139  --  137  K 3.4* 3.4* 3.2*  CL 102  --  103  CO2 22  --  24  GLUCOSE 105*  --  150*  BUN 13  --  12  CREATININE 1.29*  --  1.34*  CALCIUM 8.5*  --  8.0*   GFR: Estimated Creatinine Clearance: 61.6 mL/min (A) (by C-G formula based on SCr of 1.34 mg/dL (H)). Liver Function Tests: Recent Labs  Lab 07/24/17 1018  AST 22  ALT 18  ALKPHOS 89  BILITOT 0.9  PROT 7.5  ALBUMIN 3.2*   No results for input(s): LIPASE, AMYLASE in the last 168 hours. No results for input(s): AMMONIA in the last 168  hours. Coagulation Profile: Recent Labs  Lab 07/24/17 1018  INR 1.18   Cardiac Enzymes: No results for input(s): CKTOTAL, CKMB, CKMBINDEX, TROPONINI in the last 168 hours. BNP (last 3 results) Recent Labs    06/18/17 1001  PROBNP 274   HbA1C: No results for input(s): HGBA1C in the last 72 hours. CBG: Recent Labs  Lab 07/24/17 0605  GLUCAP 161*   Lipid Profile: No results for input(s): CHOL, HDL, LDLCALC, TRIG, CHOLHDL, LDLDIRECT in the last 72 hours. Thyroid Function Tests: No results for input(s): TSH, T4TOTAL, FREET4, T3FREE, THYROIDAB in the last 72 hours. Anemia Panel: No results for input(s): VITAMINB12, FOLATE, FERRITIN, TIBC, IRON, RETICCTPCT in the last 72 hours. Urine analysis:    Component Value Date/Time   COLORURINE YELLOW 07/24/2017 1101   APPEARANCEUR CLEAR 07/24/2017 1101   LABSPEC 1.025 07/24/2017 1101   PHURINE 5.5 07/24/2017 1101   GLUCOSEU NEGATIVE 07/24/2017 1101   HGBUR SMALL (A) 07/24/2017 1101   BILIRUBINUR NEGATIVE 07/24/2017 1101   KETONESUR 15 (A) 07/24/2017 1101   PROTEINUR 100 (A) 07/24/2017 1101   UROBILINOGEN 1.0 10/24/2011 1742   NITRITE NEGATIVE 07/24/2017 1101   LEUKOCYTESUR NEGATIVE 07/24/2017 1101   Sepsis Labs: @LABRCNTIP (procalcitonin:4,lacticidven:4) )No results found for this or any previous visit (from the past 240 hour(s)).   Radiological Exams on Admission: Ct Abdomen Pelvis W Contrast  Result Date: 07/24/2017 CLINICAL DATA:  Elevated white blood cell count. Lower abdominal pain and nausea for a month. Fever. EXAM: CT ABDOMEN AND PELVIS WITH CONTRAST TECHNIQUE: Multidetector CT imaging of the abdomen and pelvis was performed using the standard protocol following bolus administration of intravenous contrast. CONTRAST:  100 ml ISOVUE-300 IOPAMIDOL (ISOVUE-300) INJECTION 61% COMPARISON:  Chest and two views abdomen this same day. CT chest 06/18/2017. FINDINGS: Lower chest: The patient has a small left pleural effusion just a  convex border. Surrounding pleura is thickened and there are septations within this collection. There is mild bibasilar atelectasis. No right pleural effusion or pericardial effusion. Cardiomegaly is identified. Calcific coronary artery disease is noted. The patient is status post CABG. Hepatobiliary: No focal liver abnormality is seen. No gallstones, gallbladder wall thickening, or biliary dilatation. Pancreas: Unremarkable. No pancreatic ductal dilatation or surrounding inflammatory changes. Spleen: Normal in size without focal abnormality. Adrenals/Urinary Tract: Left renal cyst is noted. Tiny intermediate attenuating lesion off the lower pole  of the right kidney is likely a cyst but cannot be definitively characterized. The kidneys are otherwise unremarkable. Ureters and urinary bladder appear normal. Adrenal glands are normal. Stomach/Bowel: There is sigmoid diverticulosis with some stranding about the proximal mid sigmoid colon consistent with diverticulitis. No abscess. The stomach, small bowel and appendix appear normal. Vascular/Lymphatic: Aortic atherosclerosis. No enlarged abdominal or pelvic lymph nodes. Reproductive: Prostate is unremarkable. Other: Trace amount of free pelvic fluid is identified. Small fat containing inguinal hernias noted. Musculoskeletal: No fracture or worrisome lesion. IMPRESSION: Sigmoid diverticulitis without abscess or perforation. Small left pleural effusion with marked pleural thickening and septations could be due to infectious process such as empyema but could also represent pleural based neoplasm. Tissue sampling could be performed for further evaluation. Calcific aortic and coronary atherosclerosis. The patient is status post CABG. Small fat containing bilateral inguinal hernias. Electronically Signed   By: Drusilla Kanner M.D.   On: 07/24/2017 12:59   Dg Chest Port 1 View  Result Date: 07/24/2017 CLINICAL DATA:  Fever today, shortness of breath and abdominal pain.  EXAM: PORTABLE CHEST 1 VIEW COMPARISON:  Chest x-ray dated 12/08/2013. FINDINGS: Stable cardiomegaly. Overall cardiomediastinal silhouette appears stable. Chronic atelectasis/scarring within the LEFT perihilar lung. Mild interstitial prominence bilaterally. Small LEFT pleural effusion and/or pleural thickening. Osseous structures about the chest are unremarkable. IMPRESSION: 1. Stable cardiomegaly. 2. Mild interstitial prominence bilaterally indicating mild CHF/volume and/or atypical pneumonia. 3. Small LEFT pleural effusion versus chronic pleural thickening. Electronically Signed   By: Bary Richard M.D.   On: 07/24/2017 10:56    EKG: Independently reviewed. Sinus tachycardia (rate 100), PAC.   Assessment/Plan   1. Acute sigmoid diverticulitis  - Presents with lower abdominal pain and fever, found to have leukocytosis to 19k with normal lactate  - CT abd/pelvis reveals sigmoid diverticulitis without abscess or perforation  - Blood cultures collected in ED, 1 liter NS given, and empiric Zosyn started  - Continue Zosyn, follow cultures and clinical course, continue symptomatic care with prn antiemetic and analgesia, advance diet as tolerated    2. CAD  - No anginal complaints  - Had been experiencing fatigue recently, prompting outpatient stress test which was abnormal and he was planned for cath on day of admit, cancelled d/t fever and marked leukocytosis  - Of note, he reports resolution of the fatigue after losing 20 lbs recently d/t diarrhea  - Continue daily ASA 81    3. Chronic systolic CHF  - Appears well-compensated, wt down significantly after recent diarrhea  - Follow daily wt and I/O's    4. CKD stage III  - SCr is 1.34 on admission, consistent with his apparent baseline  - Renally-dose medications, avoid nephrotoxins    5. Hypokalemia - Serum potassium is 3.2 on admission  - Treated with 20 mEq oral potassium   - Repeat chem panel in am   6. Left pleural effusion/pleural  thickening  - Incidentally noted on CT  - Concerning for infection or neoplasm  - No s/s of pulmonary infection  - Recommend close outpatient follow up   DVT prophylaxis: Lovenox Code Status: Full  Family Communication: Discussed with patient Consults called: none Admission status: Inpatient    Briscoe Deutscher, MD [facility] Pager [phone]  If 7PM-7AM, please contact night-coverage [email or web address] Password [facility]  07/24/2017, 7:31 PM

## 2017-07-24 NOTE — ED Notes (Signed)
Pt returned from radiology at this time.  

## 2017-07-24 NOTE — Progress Notes (Signed)
Pharmacy Antibiotic Note  James [name] is a 72 y.o. male admitted on 07/24/2017 with intra-abdominal infection.  Pharmacy has been consulted for zosyn dosing. Tmax is 101.8 and WBC is elevated at 19.5. SCr is slightly elevated at 1.34 but appears to be patients baseline.   Plan: Zosyn 3.375gm IV Q8H (4 hr inf) F/u renal fxn, C&S, clinical status *Pharmacy will sign off as no further dose adjustments are anticipated. Thank you for the consult!  Height: 5\' 9"  (175.3 cm) Weight: 248 lb (112.5 kg) IBW/kg (Calculated) : 70.7  Temp (24hrs), Avg:101 F (38.3 C), Min:100.4 F (38 C), Max:101.8 F (38.8 C)  Recent Labs  Lab 07/22/17 1031 07/24/17 1018 07/24/17 1024  WBC 12.8* 19.5*  --   CREATININE 1.29* 1.34*  --   LATICACIDVEN  --   --  1.71    Estimated Creatinine Clearance: 61.6 mL/min (A) (by C-G formula based on SCr of 1.34 mg/dL (H)).    Allergies  Allergen Reactions  . Lisinopril Cough    Antimicrobials this admission: Zosyn 6/7>>  Dose adjustments this admission: N/A  Microbiology results: Pending  Thank you for allowing pharmacy to be a part of this patient's care.  Natahlia Hoggard, James [name] 07/24/2017 10:57 AM

## 2017-07-24 NOTE — Plan of Care (Signed)
?  Problem: Elimination: ?Goal: Will not experience complications related to bowel motility ?Outcome: Progressing ?  ?Problem: Pain Managment: ?Goal: General experience of comfort will improve ?Outcome: Progressing ?  ?Problem: Safety: ?Goal: Ability to remain free from injury will improve ?Outcome: Progressing ?  ?

## 2017-07-24 NOTE — Progress Notes (Signed)
72 yo male with CAD apparently was supposed to have cardiac cath today due to abnormal stress test apparently was febrile and sent to [facility] who sent the patient to [facility] ER.   Pt was noted to be febrile and elevated wbc 19.5, hypokalemic 3.2 . C/o lower abdominal discomfort and diarrhea.  CT scan showed sigmoid diverticulitis.

## 2017-07-24 NOTE — ED Provider Notes (Signed)
[facility] EMERGENCY DEPARTMENT Provider Note   CSN: [id number] Arrival date & time: 07/24/17  4098     History   Chief Complaint Chief Complaint  Patient presents with  . Abdominal Pain  . Fever    HPI James [name] is a 72 y.o. male.  The history is provided by the patient. No language interpreter was used.  Abdominal Pain   Associated symptoms include fever.  Fever     James [name] is a 72 y.o. male who presents to the Emergency Department complaining of fever and abdominal pain. He was referred to the emergency department for evaluation of fever when he was scheduled for a routine heart cath today. He reports intermittent fevers for the last week but has not checked his temperature at home. When he presented for heart cath he was noted to have a temperature to 100.4. He endorses two weeks of nausea with diarrhea. His diarrhea has decreased but he still has liquid stools. He also describes suprapubic abdominal pain described as a dull and constant pain. He denies any vomiting, chest pain. He does have mild shortness of breath and cough. He was admitted to the hospital a month ago for sepsis with cellulitis of the left lower extremity. He states that overall the swelling in his legs has significantly improved and he does not currently complain of pain in his legs. When the heart cath was canceled today he presented to his PCPs office who then referred him to the emergency department. Past Medical History:  Diagnosis Date  . Aortic atherosclerosis (HCC) 07/07/2017  . Ascending aortic aneurysm (HCC)    cMRI (2/15): Normal LV EF 54%, Mild BAE, Trileaflet Aortic Valve, Upper limits of normal ascending aorta 3.8 cm, Normal aortic arch 2.3 cm with bovine origin of left carotid  . Atrial flutter (HCC)    Post-op with no reoccurence  . Chronic diastolic heart failure (HCC)    Echo (1/15): Left ventricle: The cavity size was mildly dilated. Mild LVH. EF 55% to 60%. Wall motion  was normal; Gr 1 diastolic dysfunction Left atrium: The atrium was mildly dilated. Right atrium: The atrium was mildly to moderately dilated. Aortic arch measures 4.8 cm (moderately dilated); suggest CTA or MRA to better assess.  . Coronary artery disease 10/2011   a. s/p cabg;  b. Myoview (9/15):  ant ischemia, poss TID, EF 43%, high risk >> LHC (9/15): Dist LM 50, pLAD 90 then 100, pCFX 50, mCFX 75, oRCA 75, dRCA 95, L-LAD patent, S-Dx 100, S-OM1 patent, S-PDA patent, EF 55% >> native Dx small caliber and not well suited for PCI; native RCA amenable to PCI and would restore flow to PLA branches but would jeopardize SVG-RCA - Med Rx  . Deviated nasal septum   . Diabetes mellitus    onset 2013  . Dyslipidemia   . GERD (gastroesophageal reflux disease)   . Hx of Doppler ultrasound    Carotid US (9/13): No ICA stenosis  . Hypertension     Patient Active Problem List   Diagnosis Date Noted  . Diverticulitis 07/24/2017  . Aortic atherosclerosis (HCC) 07/07/2017  . Streptococcal bacteremia   . Sepsis (HCC) 06/20/2017  . Cellulitis of left lower extremity 06/20/2017  . SOB (shortness of breath) 06/18/2017  . Abnormal nuclear cardiac imaging test 11/14/2013  . Abnormal EKG 03/09/2013  . Chronic diastolic heart failure (HCC) 03/09/2013  . Dilated aortic root (HCC)   . Diastolic dysfunction   . Edema 02/09/2013  .  Encounter for long-term (current) use of other medications 02/09/2013  . Hypertension 10/24/2011  . Coronary artery disease 10/24/2011  . Hyperlipidemia 10/24/2011  . DM (diabetes mellitus), type 2, uncontrolled with complications (HCC) 10/24/2011  . GERD (gastroesophageal reflux disease) 10/24/2011    Past Surgical History:  Procedure Laterality Date  . CARDIAC CATHETERIZATION    . CORONARY ARTERY BYPASS GRAFT  10/27/2011   Procedure: CORONARY ARTERY BYPASS GRAFTING (CABG);  Surgeon: Kerin Perna, MD;  Location: [facility] OR;  Service: Open Heart Surgery;  Laterality: N/A;   Coronary Artery Bypass Grafting times four using left internal mammary artery and right greater saphenous vein endoscopically harvested  . LEFT HEART CATHETERIZATION WITH CORONARY ANGIOGRAM N/A 10/23/2011   Procedure: LEFT HEART CATHETERIZATION WITH CORONARY ANGIOGRAM;  Surgeon: Quintella Reichert, MD;  Location: [facility] CATH LAB;  Service: Cardiovascular;  Laterality: N/A;  . LEFT HEART CATHETERIZATION WITH CORONARY ANGIOGRAM N/A 11/16/2013   Procedure: LEFT HEART CATHETERIZATION WITH CORONARY ANGIOGRAM;  Surgeon: Micheline Chapman, MD;  Location: [facility] CATH LAB;  Service: Cardiovascular;  Laterality: N/A;  . left knee arthroscopy  2003  . right knee arthroscopy  2006        Home Medications    Prior to Admission medications   Medication Sig Start Date End Date Taking? Authorizing Provider  amoxicillin (AMOXIL) 500 MG capsule Take 1 capsule (500 mg total) by mouth every 8 (eight) hours. 06/26/17   Ghimire, Werner Lean, MD  aspirin 81 MG tablet Take 81 mg by mouth daily.    [provider]  cholecalciferol (VITAMIN D) 1000 units tablet Take 2,000 Units by mouth daily.    [provider]  collagenase (SANTYL) ointment Apply topically daily. Cleanse left leg with NS and pat dry.  Apply Santyl to slough in anterior leg wounds. Cover with NS moist gauze and ABD pad. Wrap leg from below toes to below knee with kelrix and secure with ace wrap.  Change daily.  Bedside RN To perform. 06/26/17   Ghimire, Werner Lean, MD  ezetimibe (ZETIA) 10 MG tablet Take 1 tablet (10 mg total) by mouth daily. 11/13/15   Quintella Reichert, MD  fish oil-omega-3 fatty acids 1000 MG capsule Take 2 g by mouth 2 (two) times daily.     [provider]  glimepiride (AMARYL) 4 MG tablet Take 4 mg by mouth daily. 06/05/17   [provider]  HUMALOG MIX 75/25 KWIKPEN (75-25) 100 UNIT/ML Kwikpen Inject 35 mLs as directed 2 (two) times daily.  03/07/14   [provider]  isosorbide mononitrate (IMDUR) 60 MG 24  hr tablet TAKE 1 TABLET (60 MG TOTAL) BY MOUTH DAILY. 02/20/17   Quintella Reichert, MD  lisinopril (PRINIVIL,ZESTRIL) 10 MG tablet Take 10 mg by mouth daily.    [provider]  metoprolol succinate (TOPROL-XL) 100 MG 24 hr tablet TAKE 1 TABLET BY MOUTH DAILY TAKE WITH OR IMMEDIATELY FOLLOWING A MEAL 01/28/17   Turner, Cornelious Bryant, MD  Multiple Vitamin (MULTIVITAMIN WITH MINERALS) TABS Take 1 tablet by mouth daily.    [provider]  potassium chloride (K-DUR) 10 MEQ tablet Take 1 tablet (10 mEq total) by mouth daily. 12/09/13   Tereso Newcomer T, PA-C  pramipexole (MIRAPEX) 0.75 MG tablet Take 1.5 mg by mouth at bedtime.     [provider]  pravastatin (PRAVACHOL) 40 MG tablet Take 40 mg by mouth daily.    [provider]  torsemide (DEMADEX) 20 MG tablet Take 2 tablets (40  mg total) by mouth daily. 06/26/17   Ghimire, Werner LeanShanker M, MD    Family History Family History  Problem Relation Age of Onset  . Hypertension Mother   . Stroke Father   . Hypertension Father   . Hypertension Paternal Grandfather   . Heart attack Neg Hx     Social History Social History   Tobacco Use  . Smoking status: Never Smoker  . Smokeless tobacco: Never Used  Substance Use Topics  . Alcohol use: Yes    Comment: 1 beer a month or less  . Drug use: No     Allergies   Lisinopril   Review of Systems Review of Systems  Constitutional: Positive for fever.  Gastrointestinal: Positive for abdominal pain.  All other systems reviewed and are negative.    Physical Exam Updated Vital Signs BP 130/69   Pulse 80   Temp 99.2 F (37.3 C) (Oral)   Resp (!) 21   Ht 5\' 9"  (1.753 m)   Wt 112.5 kg (248 lb)   SpO2 93%   BMI 36.62 kg/m   Physical Exam  Constitutional: He is oriented to person, place, and time. He appears well-developed and well-nourished.  HENT:  Head: Normocephalic and atraumatic.  Cardiovascular: Regular rhythm.  No murmur heard. Tachycardic    Pulmonary/Chest: Effort normal and breath sounds normal. No respiratory distress.  Abdominal: Soft. There is no rebound and no guarding.  Mild to moderate suprapubic tenderness without guarding or rebound. No inguinal hernias or masses.  Musculoskeletal: He exhibits no tenderness.  None pitting edema to bilateral lower extremities, left greater than right with chronic venous stasis changes to bilateral lower extremities. There is slight erythema to the left lower extremity  Neurological: He is alert and oriented to person, place, and time.  Skin: Skin is warm and dry.  Psychiatric: He has a normal mood and affect. His behavior is normal.  Nursing note and vitals reviewed.    ED Treatments / Results  Labs (all labs ordered are listed, but only abnormal results are displayed) Labs Reviewed  COMPREHENSIVE METABOLIC PANEL - Abnormal; Notable for the following components:      Result Value   Potassium 3.2 (*)    Glucose, Bld 150 (*)    Creatinine, Ser 1.34 (*)    Calcium 8.0 (*)    Albumin 3.2 (*)    GFR calc non Af Amer 51 (*)    GFR calc Af Amer 59 (*)    All other components within normal limits  CBC WITH DIFFERENTIAL/PLATELET - Abnormal; Notable for the following components:   WBC 19.5 (*)    HCT 38.0 (*)    Neutro Abs 16.4 (*)    Monocytes Absolute 1.7 (*)    All other components within normal limits  URINALYSIS, ROUTINE W REFLEX MICROSCOPIC - Abnormal; Notable for the following components:   Hgb urine dipstick SMALL (*)    Ketones, ur 15 (*)    Protein, ur 100 (*)    All other components within normal limits  URINALYSIS, MICROSCOPIC (REFLEX) - Abnormal; Notable for the following components:   Bacteria, UA RARE (*)    All other components within normal limits  CULTURE, BLOOD (ROUTINE X 2)  CULTURE, BLOOD (ROUTINE X 2)  PROTIME-INR  I-STAT CG4 LACTIC ACID, ED    EKG EKG Interpretation  Date/Time:  Friday July 24 2017 10:07:07 EDT Ventricular Rate:  100 PR  Interval:    QRS Duration: 107 QT Interval:  355 QTC Calculation: 458  R Axis:   66 Text Interpretation:  Sinus tachycardia Atrial premature complex Probable left atrial enlargement Baseline wander in lead(s) V6 Confirmed by Tilden Fossa [phone]) on 07/24/2017 10:14:32 AM   Radiology Ct Abdomen Pelvis W Contrast  Result Date: 07/24/2017 CLINICAL DATA:  Elevated white blood cell count. Lower abdominal pain and nausea for a month. Fever. EXAM: CT ABDOMEN AND PELVIS WITH CONTRAST TECHNIQUE: Multidetector CT imaging of the abdomen and pelvis was performed using the standard protocol following bolus administration of intravenous contrast. CONTRAST:  100 ml ISOVUE-300 IOPAMIDOL (ISOVUE-300) INJECTION 61% COMPARISON:  Chest and two views abdomen this same day. CT chest 06/18/2017. FINDINGS: Lower chest: The patient has a small left pleural effusion just a convex border. Surrounding pleura is thickened and there are septations within this collection. There is mild bibasilar atelectasis. No right pleural effusion or pericardial effusion. Cardiomegaly is identified. Calcific coronary artery disease is noted. The patient is status post CABG. Hepatobiliary: No focal liver abnormality is seen. No gallstones, gallbladder wall thickening, or biliary dilatation. Pancreas: Unremarkable. No pancreatic ductal dilatation or surrounding inflammatory changes. Spleen: Normal in size without focal abnormality. Adrenals/Urinary Tract: Left renal cyst is noted. Tiny intermediate attenuating lesion off the lower pole of the right kidney is likely a cyst but cannot be definitively characterized. The kidneys are otherwise unremarkable. Ureters and urinary bladder appear normal. Adrenal glands are normal. Stomach/Bowel: There is sigmoid diverticulosis with some stranding about the proximal mid sigmoid colon consistent with diverticulitis. No abscess. The stomach, small bowel and appendix appear normal. Vascular/Lymphatic: Aortic  atherosclerosis. No enlarged abdominal or pelvic lymph nodes. Reproductive: Prostate is unremarkable. Other: Trace amount of free pelvic fluid is identified. Small fat containing inguinal hernias noted. Musculoskeletal: No fracture or worrisome lesion. IMPRESSION: Sigmoid diverticulitis without abscess or perforation. Small left pleural effusion with marked pleural thickening and septations could be due to infectious process such as empyema but could also represent pleural based neoplasm. Tissue sampling could be performed for further evaluation. Calcific aortic and coronary atherosclerosis. The patient is status post CABG. Small fat containing bilateral inguinal hernias. Electronically Signed   By: Drusilla Kanner M.D.   On: 07/24/2017 12:59   Dg Chest Port 1 View  Result Date: 07/24/2017 CLINICAL DATA:  Fever today, shortness of breath and abdominal pain. EXAM: PORTABLE CHEST 1 VIEW COMPARISON:  Chest x-ray dated 12/08/2013. FINDINGS: Stable cardiomegaly. Overall cardiomediastinal silhouette appears stable. Chronic atelectasis/scarring within the LEFT perihilar lung. Mild interstitial prominence bilaterally. Small LEFT pleural effusion and/or pleural thickening. Osseous structures about the chest are unremarkable. IMPRESSION: 1. Stable cardiomegaly. 2. Mild interstitial prominence bilaterally indicating mild CHF/volume and/or atypical pneumonia. 3. Small LEFT pleural effusion versus chronic pleural thickening. Electronically Signed   By: Bary Richard M.D.   On: 07/24/2017 10:56    Procedures Procedures (including critical care time) CRITICAL CARE Performed by: Tilden Fossa   Total critical care time: 35 minutes  Critical care time was exclusive of separately billable procedures and treating other patients.  Critical care was necessary to treat or prevent imminent or life-threatening deterioration.  Critical care was time spent personally by me on the following activities: development of  treatment plan with patient and/or surrogate as well as nursing, discussions with consultants, evaluation of patient's response to treatment, examination of patient, obtaining history from patient or surrogate, ordering and performing treatments and interventions, ordering and review of laboratory studies, ordering and review of radiographic studies, pulse oximetry and re-evaluation of patient's condition.  Medications Ordered in  ED Medications  piperacillin-tazobactam (ZOSYN) IVPB 3.375 g (has no administration in time range)  sodium chloride 0.9 % bolus 1,000 mL (0 mLs Intravenous Stopped 07/24/17 1205)  acetaminophen (TYLENOL) tablet 650 mg (650 mg Oral Given 07/24/17 1104)  piperacillin-tazobactam (ZOSYN) IVPB 3.375 g (0 g Intravenous Stopped 07/24/17 1134)  iopamidol (ISOVUE-300) 61 % injection 100 mL (100 mLs Intravenous Contrast Given 07/24/17 1153)  morphine 4 MG/ML injection 4 mg (4 mg Intravenous Given 07/24/17 1410)  ondansetron (ZOFRAN) injection 4 mg (4 mg Intravenous Given 07/24/17 1409)     Initial Impression / Assessment and Plan / ED Course  I have reviewed the triage vital signs and the nursing notes.  Pertinent labs & imaging results that were available during my care of the patient were reviewed by me and considered in my medical decision making (see chart for details).     Patient here for evaluation of fever, abdominal pain, diarrhea. He does have lower abdominal tenderness on evaluation without peritoneal findings. CBC demonstrates significant leukocytosis. CT abdomen was obtained and consistent with diverticulitis. He was treated with antibiotics, IV fluids. Hospitals consulted for admission for further treatment. Patient updated findings of studies recommendation for admission and he is in agreement with treatment plan.  Final Clinical Impressions(s) / ED Diagnoses   Final diagnoses:  Diverticulitis  Sepsis, due to unspecified organism [facility])    ED Discharge Orders    None        Tilden Fossa, MD 07/24/17 1521

## 2017-07-24 NOTE — Progress Notes (Signed)
Dr. Okey DupreEnd at beside. Per him, due to redness, swelling of both legs from knee down, fever (100.4), and elevated WBC 2 days ago; will cancel heart cath today. Pt encouraged to see PCP today. Pt verbalized understanding.

## 2017-07-24 NOTE — ED Triage Notes (Signed)
[facility] reports [facility] to [facility] for cardiac cath but was unable to have procedure due to fever.  Reports he was recently on antibiotics for cellulitis and then began having lower abdominal pain with several days of diarrhea.  States stools are now down to 1 episode of diarrhea today.  Reports 1 episode of blood in urine.

## 2017-07-24 NOTE — H&P (Signed)
Cardiac Catheterization History and Physical:   Patient ID: James [name]; MRN: [id number]; DOB: 28-Sep-1945   Admission date: 07/24/2017  Primary Care Provider: Joycelyn RuaMeyers, Stephen, MD Primary Cardiologist: James Magicraci Turner, MD  Primary Electrophysiologist:  None  Chief Complaint:  Abnormal stress test  Patient Profile:   James [name] is a 72 y.o. male with a history of coronary artery disease s/p CABG, diastolic congestive heart failure, diabetes, hypertension, and hyperlipidemia, presenting for evaluation of shortness of breath and abnormal echo/stress test.  History of Present Illness:   James [name] was hospitalized last month due to sepsis secondary to LLE cellulitis.  Hospital course was complicated by volume overload and echo demonstrated moderately reduced LV function (EF 40-45%).  He subsequently followed up with Tereso NewcomerScott [name] and was referred for myocardial perfusion stress test that showed anteroapical ischemia.  [facility]  James [name] reports that his shortness of breath has actually improved since his hospitalization last month.  However, he experienced significant diarrhea and stomach pain that prompted him to cut short his antibiotic treatment.  His diarrhea has improved, though he continues to have loose stools and intermittent abdominal pain.  He denies chest pain, shortness of breath, fevers, and chills.  Due to continued loose stools, his PCP had James [name] stop all of his medications except for pramipexole to see if his symptoms would improve.  Catheterization has already been postponed once due to diarrhea.  At this time, James [name] feels well.  He reports continued redness in the left calf, though he feels that it has improved since his hospitalization for cellulitis last month.   Past Medical History:  Diagnosis Date  . Aortic atherosclerosis (HCC) 07/07/2017  . Ascending aortic aneurysm (HCC)    cMRI (2/15): Normal LV EF 54%, Mild BAE,  Trileaflet Aortic Valve, Upper limits of normal ascending aorta 3.8 cm, Normal aortic arch 2.3 cm with bovine origin of left carotid  . Atrial flutter (HCC)    Post-op with no reoccurence  . Chronic diastolic heart failure (HCC)    Echo (1/15): Left ventricle: The cavity size was mildly dilated. Mild LVH. EF 55% to 60%. Wall motion was normal; Gr 1 diastolic dysfunction Left atrium: The atrium was mildly dilated. Right atrium: The atrium was mildly to moderately dilated. Aortic arch measures 4.8 cm (moderately dilated); suggest CTA or MRA to better assess.  . Coronary artery disease 10/2011   a. s/p cabg;  b. Myoview (9/15):  ant ischemia, poss TID, EF 43%, high risk >> LHC (9/15): Dist LM 50, pLAD 90 then 100, pCFX 50, mCFX 75, oRCA 75, dRCA 95, L-LAD patent, S-Dx 100, S-OM1 patent, S-PDA patent, EF 55% >> native Dx small caliber and not well suited for PCI; native RCA amenable to PCI and would restore flow to PLA branches but would jeopardize SVG-RCA - Med Rx  . Deviated nasal septum   . Diabetes mellitus    onset 2013  . Dyslipidemia   . GERD (gastroesophageal reflux disease)   . Hx of Doppler ultrasound    Carotid US (9/13): No ICA stenosis  . Hypertension     Past Surgical History:  Procedure Laterality Date  . CARDIAC CATHETERIZATION    . CORONARY ARTERY BYPASS GRAFT  10/27/2011   Procedure: CORONARY ARTERY BYPASS GRAFTING (CABG);  Surgeon: James PernaPeter Van Trigt, MD;  [facility];  Service: Open Heart Surgery;  Laterality: N/A;  Coronary Artery Bypass Grafting times four using left internal mammary artery  and right greater saphenous vein endoscopically harvested  . LEFT HEART CATHETERIZATION WITH CORONARY ANGIOGRAM N/A 10/23/2011   Procedure: LEFT HEART CATHETERIZATION WITH CORONARY ANGIOGRAM;  Surgeon: James Reichert, MD;  Location: [facility] CATH LAB;  Service: Cardiovascular;  Laterality: N/A;  . LEFT HEART CATHETERIZATION WITH CORONARY ANGIOGRAM N/A 11/16/2013   Procedure: LEFT HEART  CATHETERIZATION WITH CORONARY ANGIOGRAM;  Surgeon: James Chapman, MD;  Location: [facility] CATH LAB;  Service: Cardiovascular;  Laterality: N/A;  . left knee arthroscopy  2003  . right knee arthroscopy  2006     Medications Prior to Admission: Prior to Admission medications   Medication Sig Start Date James [name] Date Taking? Authorizing Provider  aspirin 81 MG tablet Take 81 mg by mouth daily.   Yes [provider]  cholecalciferol (VITAMIN D) 1000 units tablet Take 2,000 Units by mouth daily.   Yes [provider]  collagenase (SANTYL) ointment Apply topically daily. Cleanse left leg with NS and pat dry.  Apply Santyl to slough in anterior leg wounds. Cover with NS moist gauze and ABD pad. Wrap leg from below toes to below knee with kelrix and secure with ace wrap.  Change daily.  Bedside RN To perform. 06/26/17  Yes Ghimire, James Lean, MD  ezetimibe (ZETIA) 10 MG tablet Take 1 tablet (10 mg total) by mouth daily. 11/13/15  Yes [name], James Bryant, MD  fish oil-omega-3 fatty acids 1000 MG capsule Take 2 g by mouth 2 (two) times daily.    Yes [provider]  glimepiride (AMARYL) 4 MG tablet Take 4 mg by mouth daily. 06/05/17  Yes [provider]  HUMALOG MIX 75/25 KWIKPEN (75-25) 100 UNIT/ML Kwikpen Inject 35 mLs as directed 2 (two) times daily.  03/07/14  Yes [provider]  isosorbide mononitrate (IMDUR) 60 MG 24 hr tablet TAKE 1 TABLET (60 MG TOTAL) BY MOUTH DAILY. 02/20/17  Yes [name], James Bryant, MD  metoprolol succinate (TOPROL-XL) 100 MG 24 hr tablet TAKE 1 TABLET BY MOUTH DAILY TAKE WITH OR IMMEDIATELY FOLLOWING A MEAL 01/28/17  Yes [name], James Bryant, MD  Multiple Vitamin (MULTIVITAMIN WITH MINERALS) TABS Take 1 tablet by mouth daily.   Yes [provider]  potassium chloride (K-DUR) 10 MEQ tablet Take 1 tablet (10 mEq total) by mouth daily. 12/09/13  Yes [name], James T, PA-C  pramipexole (MIRAPEX) 0.75 MG tablet Take 1.5 mg by mouth at bedtime.    Yes  [provider]  pravastatin (PRAVACHOL) 40 MG tablet Take 40 mg by mouth daily.   Yes [provider]  torsemide (DEMADEX) 20 MG tablet Take 2 tablets (40 mg total) by mouth daily. 06/26/17  Yes Ghimire, James Lean, MD  amoxicillin (AMOXIL) 500 MG capsule Take 1 capsule (500 mg total) by mouth every 8 (eight) hours. 06/26/17   Ghimire, James Lean, MD  lisinopril (PRINIVIL,ZESTRIL) 10 MG tablet Take 10 mg by mouth daily.    [provider]     Allergies:    Allergies  Allergen Reactions  . Lisinopril Cough    Social History:   Social History   Socioeconomic History  . Marital status: Divorced    Spouse name: Not on file  . Number of children: Not on file  . Years of education: Not on file  . Highest education level: Not on file  Occupational History  . Not on file  Social Needs  . Financial resource strain: Not on file  . Food insecurity:    Worry: Not on file  Inability: Not on file  . Transportation needs:    Medical: Not on file    Non-medical: Not on file  Tobacco Use  . Smoking status: Never Smoker  . Smokeless tobacco: Never Used  Substance and Sexual Activity  . Alcohol use: Yes    Comment: 1 beer a month or less  . Drug use: No  . Sexual activity: Not on file  Lifestyle  . Physical activity:    Days per week: Not on file    Minutes per session: Not on file  . Stress: Not on file  Relationships  . Social connections:    Talks on phone: Not on file    Gets together: Not on file    Attends religious service: Not on file    Active member of club or organization: Not on file    Attends meetings of clubs or organizations: Not on file    Relationship status: Not on file  . Intimate partner violence:    Fear of current or ex partner: Not on file    Emotionally abused: Not on file    Physically abused: Not on file    Forced sexual activity: Not on file  Other Topics Concern  . Not on file  Social History Narrative  . Not on file      Family History:   The patient's family history includes Hypertension in his father, mother, and paternal grandfather; Stroke in his father. There is no history of Heart attack.    ROS:  Please see the history of present illness.  All other ROS reviewed and negative.     Physical Exam/Data:   Vitals:   07/24/17 0559  BP: (!) 177/75  Pulse: 61  Resp: 18  Temp: (!) 100.4 F (38 C)  TempSrc: Oral  SpO2: 96%  Weight: 248 lb (112.5 kg)  Height: 5\' 9"  (1.753 m)   No intake or output data in the 24 hours ending 07/24/17 0653 Filed Weights   07/24/17 0559  Weight: 248 lb (112.5 kg)   Body mass index is 36.62 kg/m.  General:  Obese man, lying comfortably in bed.  He is warm to the touch. HEENT: normal Lymph: no adenopathy Neck: no JVD Endocrine:  No thryomegaly Vascular: No carotid bruits; Radial pulses 2+ bilaterally. Cardiac:  normal S1, S2; RRR; no murmur, rubs, or gallops. Lungs:  clear to auscultation bilaterally, no wheezing, rhonchi or rales  Abd: soft, nontender, no hepatomegaly  Ext: 1+ pretibial edema (left greater than right). Musculoskeletal:  No deformities, BUE and BLE strength normal and equal Skin: warm and dry.  Erythema and warmth noted in mid and distal left calf. Neuro:  CNs 2-12 intact, no focal abnormalities noted Psych:  Normal affect    EKG:  The ECG that was done 07/07/17 was personally reviewed and demonstrates NSR with non-specific ST changes.  Relevant CV Studies: Myoview (07/09/17): Intermediate risk study with anteroapical reversible defect and moderately reduced LVEF.  Echo (06/24/17): - Procedure narrative: Transthoracic echocardiography. Image   quality was adequate. The study was technically difficult, as a   result of poor acoustic windows and body habitus. Intravenous   contrast (Definity) was administered. - Left ventricle: The cavity size was normal. Wall thickness was   increased in a pattern of mild LVH. Systolic function was mildly    to moderately reduced. The estimated ejection fraction was in the   range of 40% to 45%. Incoordinate septal motion and possible   anteroseptal hypokinesis. The study  is not technically sufficient   to allow evaluation of LV diastolic function. - Left atrium: The atrium was normal in size. - Right atrium: Moderately dilated. - Inferior vena cava: The vessel was dilated. The respirophasic   diameter changes were blunted (< 50%), consistent with elevated   central venous pressure.  Laboratory Data:  Chemistry Recent Labs  Lab 07/22/17 1031  NA 139  K 3.4*  CL 102  CO2 22  GLUCOSE 105*  BUN 13  CREATININE 1.29*  CALCIUM 8.5*  GFRNONAA 55*  GFRAA 64    No results for input(s): PROT, ALBUMIN, AST, ALT, ALKPHOS, BILITOT in the last 168 hours. Hematology Recent Labs  Lab 07/22/17 1031  WBC 12.8*  RBC 4.64  HGB 13.5  HCT 39.0  MCV 84  MCH 29.1  MCHC 34.6  RDW 13.7  PLT 270   Cardiac EnzymesNo results for input(s): TROPONINI in the last 168 hours. No results for input(s): TROPIPOC in the last 168 hours.  BNPNo results for input(s): BNP, PROBNP in the last 168 hours.  DDimer No results for input(s): DDIMER in the last 168 hours.  Radiology/Studies:  No results found.  Assessment and Plan:   Fever and likely cellulitis Patient is asymptomatic but is mildly febrile with temperature of 100.4 degree Fahrenheit.  He also had a modest leukocytosis on precatheterization labs checked 2 days ago.  Recurrent/undertreated LLE cellulitis is the most likely cause, though given recent diarrhea and continued intermittent loose stools and abdominal pain, abdominal source (including C. diff) is also a consideration.  James [name] is not symptomatic at this time and is hemodynamically stable.  I have advised him to see his PCP today for further evaluation.  We will also reach out to his PCP's office this morning to inform them of the situation and to help facilitate close follow-up.  CAD and  cardiomyopathy No unstable symptoms.  I agree that catheterization is indicated given recent CHF during hospitalization for sepsis, with echo and subsequent nuclear stress test revealing abnormalities.  However, in the setting of active infection and lack of unstable cardiac symptoms, I think it is prudent to postpone catheterization.  Catheterization should be rescheduled once James [name] once febrile illness has been adequately addressed.  Disposition: Cardiac catheterization cancelled and patient discharged to follow-up with his PCP today.  I advised James [name] to come to the emergency department if he develops chest pain, shortness of breath, worsening abdominal pain, or leg pain/worsening redness.  I will have our office reach out to James [name] next week to reassess his status and reschedule catheterization, as appropriate.  For questions or updates, please contact [facility] Please consult [email or web address] for contact info under Cardiology/STEMI.   Signed, Yvonne Kendall, MD  07/24/2017 6:53 AM

## 2017-07-25 ENCOUNTER — Inpatient Hospital Stay (HOSPITAL_COMMUNITY): Payer: Medicare HMO

## 2017-07-25 DIAGNOSIS — L03116 Cellulitis of left lower limb: Secondary | ICD-10-CM

## 2017-07-25 DIAGNOSIS — L0390 Cellulitis, unspecified: Secondary | ICD-10-CM

## 2017-07-25 DIAGNOSIS — R609 Edema, unspecified: Secondary | ICD-10-CM

## 2017-07-25 DIAGNOSIS — M79662 Pain in left lower leg: Secondary | ICD-10-CM

## 2017-07-25 DIAGNOSIS — M7989 Other specified soft tissue disorders: Secondary | ICD-10-CM

## 2017-07-25 LAB — BASIC METABOLIC PANEL
Anion gap: 9 (ref 5–15)
BUN: 12 mg/dL (ref 6–20)
CHLORIDE: 103 mmol/L (ref 101–111)
CO2: 24 mmol/L (ref 22–32)
CREATININE: 1.33 mg/dL — AB (ref 0.61–1.24)
Calcium: 7.9 mg/dL — ABNORMAL LOW (ref 8.9–10.3)
GFR, EST NON AFRICAN AMERICAN: 52 mL/min — AB (ref >60)
Glucose, Bld: 137 mg/dL — ABNORMAL HIGH (ref 65–99)
Potassium: 3.1 mmol/L — ABNORMAL LOW (ref 3.5–5.1)
SODIUM: 136 mmol/L (ref 135–145)

## 2017-07-25 LAB — CBC WITH DIFFERENTIAL/PLATELET
BASOS PCT: 0 %
Basophils Absolute: 0 10*3/uL (ref 0.0–0.1)
EOS ABS: 0.4 10*3/uL (ref 0.0–0.7)
EOS PCT: 2 %
HCT: 35.6 % — ABNORMAL LOW (ref 39.0–52.0)
Hemoglobin: 11.7 g/dL — ABNORMAL LOW (ref 13.0–17.0)
Lymphocytes Relative: 4 %
Lymphs Abs: 0.7 10*3/uL (ref 0.7–4.0)
MCH: 28 pg (ref 26.0–34.0)
MCHC: 32.9 g/dL (ref 30.0–36.0)
MCV: 85.2 fL (ref 78.0–100.0)
Monocytes Absolute: 1.5 10*3/uL — ABNORMAL HIGH (ref 0.1–1.0)
Monocytes Relative: 9 %
Neutro Abs: 14.8 10*3/uL — ABNORMAL HIGH (ref 1.7–7.7)
Neutrophils Relative %: 85 %
PLATELETS: 252 10*3/uL (ref 150–400)
RBC: 4.18 MIL/uL — AB (ref 4.22–5.81)
RDW: 13.6 % (ref 11.5–15.5)
WBC: 17.5 10*3/uL — AB (ref 4.0–10.5)

## 2017-07-25 LAB — HEMOGLOBIN A1C
HEMOGLOBIN A1C: 8.4 % — AB (ref 4.8–5.6)
Mean Plasma Glucose: 194.38 mg/dL

## 2017-07-25 LAB — MAGNESIUM: MAGNESIUM: 1.7 mg/dL (ref 1.7–2.4)

## 2017-07-25 LAB — GLUCOSE, CAPILLARY
GLUCOSE-CAPILLARY: 141 mg/dL — AB (ref 65–99)
GLUCOSE-CAPILLARY: 139 mg/dL — AB (ref 65–99)

## 2017-07-25 MED ORDER — POTASSIUM CHLORIDE 10 MEQ/100ML IV SOLN
10.0000 meq | INTRAVENOUS | Status: AC
  Administered 2017-07-25 (×2): 10 meq via INTRAVENOUS
  Filled 2017-07-25: qty 100

## 2017-07-25 MED ORDER — POTASSIUM CHLORIDE 10 MEQ/100ML IV SOLN
INTRAVENOUS | Status: AC
  Administered 2017-07-25: 10 meq
  Filled 2017-07-25: qty 100

## 2017-07-25 MED ORDER — SODIUM CHLORIDE 0.9 % IV SOLN
INTRAVENOUS | Status: DC
  Administered 2017-07-25 – 2017-07-28 (×6): via INTRAVENOUS

## 2017-07-25 MED ORDER — SODIUM CHLORIDE 0.9 % IV BOLUS
1000.0000 mL | Freq: Once | INTRAVENOUS | Status: AC
  Administered 2017-07-25: 1000 mL via INTRAVENOUS

## 2017-07-25 MED ORDER — INSULIN ASPART 100 UNIT/ML ~~LOC~~ SOLN
0.0000 [IU] | Freq: Three times a day (TID) | SUBCUTANEOUS | Status: DC
  Administered 2017-07-25 – 2017-07-28 (×7): 1 [IU] via SUBCUTANEOUS
  Administered 2017-07-29 (×3): 2 [IU] via SUBCUTANEOUS
  Administered 2017-07-30 (×2): 3 [IU] via SUBCUTANEOUS

## 2017-07-25 MED ORDER — POTASSIUM CHLORIDE CRYS ER 20 MEQ PO TBCR
20.0000 meq | EXTENDED_RELEASE_TABLET | Freq: Once | ORAL | Status: AC
  Administered 2017-07-25: 20 meq via ORAL
  Filled 2017-07-25: qty 1

## 2017-07-25 NOTE — Progress Notes (Signed)
Patient's LLE noted to be red, hot to touch.  Patient reports he previously has had cellulitis in this leg.  Dr. Marland McalpineSheikh informed; area has been marked and dopplers ordered.  Will continue to monitor.

## 2017-07-25 NOTE — Progress Notes (Addendum)
Patient running a temp of 102. Administered tylenol. Temp 101.1 at 1700.  Checking temp q2hrs. Per patient report still having runny stools. Patient reports that pain has been at a 3 out of 10 at each check today. Checked at 1000, 1200, 1400, and 1700.

## 2017-07-25 NOTE — Progress Notes (Signed)
Patient temp 102 orally.  Dr. Marland McalpineSheikh notified; tylenol 650 mg PO administered.  Will f/u with patient.

## 2017-07-25 NOTE — Progress Notes (Signed)
VASCULAR LAB PRELIMINARY  PRELIMINARY  PRELIMINARY  PRELIMINARY  Bilateral lower extremity venous duplex completed.    Preliminary report:  There is no DVT or SVT noted in the bilateral lower extremities.   Tereasa Yilmaz, RVT 07/25/2017, 3:53 PM

## 2017-07-25 NOTE — Progress Notes (Signed)
PROGRESS NOTE    James [name]  ZOX:[id number] DOB: [birth year] DOA: 07/24/2017 PCP: Joycelyn Rua, MD   Brief Narrative:  James [name] is a 72 y.o. male with medical history significant for coronary artery disease, chronic systolic CHF, hypertension, and recent lower extremity cellulitis treated with antibiotics and complicated by diarrhea which has since resolved, now presenting to the emergency department for evaluation of fevers and lower abdominal discomfort. The patient has been experiencing fatigue recently, prompting outpatient stress test that was found to be abnormal, and he presented to the hospital today for coronary catheterization.  He was found to be febrile, was complaining of lower abdominal pain, labs were collected and he was noted to have a marked leukocytosis so catheterization was canceled, and he was directed to the ED.  Patient denied melena or hematochezia, reports that he was recently experiencing diarrhea, but that has essentially resolved and he is now having approximately 1 loose stool daily.  He denies chest pain, palpitations, shortness breath, or cough.  Reports that his chronic bilateral lower extremity edema is better than usual.  Upon arrival to the ED,  Chest x-ray was notable for stable cardiomegaly and mild interstitial prominence concerning for mild CHF or atypical pneumonia.  CT of the abdomen and pelvis reveals sigmoid diverticulitis without abscess or perforation.  Also noted on the CT is small left pleural effusion with marked pleural thickening concerning for possible infection versus neoplasm.  Patient wasadmitted for ongoing evaluation and management of acute sigmoid diverticulitis and subsequently found to have a Swollen and Erythematous Left Leg.   Assessment & Plan:   Principal Problem:   Diverticulitis of sigmoid colon Active Problems:   Hypertension   Coronary artery disease   Chronic systolic CHF (congestive heart failure) (HCC)   CKD (chronic  kidney disease), stage III (HCC)   Pleural thickening  Acute Sigmoid Diverticulitis  -Presented with lower abdominal pain and fever, found to have leukocytosis to 19k with normal lactate  -CT abd/pelvis reveals sigmoid diverticulitis without abscess or perforation  -Blood cultures collected in ED, 1 liter NS given, and empiric Zosyn started -Blood cultures x2 are still pending -Patient Spiked another Temperature of 101.8 this AM  -Repeat 1 Liter of NS and restart IVF at 75 mL/hr -WBC went from 19.5 -> 17.5 -Continue Zosyn, follow cultures and clinical course, continue symptomatic care with prn antiemetic and analgesia, advance diet as tolerated   -Continue clear liquid diet for now  Left Leg Swelling and Erythema with Concern for Cellulitis  -Left Leg with Swelling, Warmth, and Erythema and is bigger than Right Leg -C/w IV Zosyn as above -Have asked Nurse to mark area -Obtain LE Venous Duplex to r/o DVT  CAD status post CABG -No Anginal complaints currently  -Had been experiencing fatigue recently, prompting outpatient stress test which was abnormal and he was planned for cath on day of admit, cancelled d/t fever and marked leukocytosis -Myocardial Perfusion Imaging showed Abnormal stress test with anteroapical ischemia and EF now reduced by echo and stress test -Of note, he reports resolution of the fatigue after losing 20 lbs recently d/t diarrhea -CT scan showed calcified aortic and coronary atherosclerosis -Continue Daily ASA 81   -Will need Cardiac Catheterization once Infection is improved   Chronic Systolic CHF  -Appears well-compensated and on the dry side,  -Weight down significantly after recent diarrhea  -Strict I's/O's and Daily Weights -Continue to Monitor Volume Status Carefully as patient was started on IVF Hydration with NS at  75 mL/hr\  CKD stage III  -SCr is 1.34 on admission, consistent with his apparent baseline  -Renally-dose medications, avoid  nephrotoxins -Started gentle IVF Hydration with NS at 75 mL/hr -BUN/Cr is now 12/1.33 -Continue to Monitor and Repeat CMP in AM  Hypokalemia -Serum potassium was 3.2 on admission; Now is 3.1 -Replete with po Potassium Chloride 20 mEQ this AM and IV KCl 30 mEQ -Mag Level was 1.7 -Continue to monitor and replete as necessary -Repeat CMP in a.m.  Left pleural effusion/pleural thickening  -Incidentally noted on CT and noted to be a small left pleural effusion just a convex border with surrounding pleura that is thickened and there are septations within this collection -Concerning for infection such as empyema or a pleural-based neoplasm  -No s/s of pulmonary infection  -Recommend close outpatient follow up and tissue sampling to be performed for further evaluation as an outpatient  Normocytic Anemia -Patient's hemoglobin/hematocrit went from 13.2/30.0 and is now 11.7/35.6 -Likely dilutional drop and in the setting of CKD -We will check anemia panel in the a.m. -Continue to monitor for signs and symptoms of bleeding -Repeat CBC in a.m.  Hyperglycemia in the setting of Previously Diagnosed Diabetes Mellitus -Check HbA1c; Last HbA1c was 7.8 several years ago -CMP this AM showed blood sugar of 137 -Continue to Monitor CBG's -Will add Sensitive Novolog SSI AC  DVT prophylaxis: Enoxaparin 40 mg sq q24h Code Status: FULL CODE Family Communication: No family present at bedside  Disposition Plan: Remain Inpatient for current workup and treatment   Consultants:   None   Procedures:  LE VENOUS DUPLEX   Antimicrobials:  Anti-infectives (From admission, onward)   Start     Dose/Rate Route Frequency Ordered Stop   07/24/17 1800  piperacillin-tazobactam (ZOSYN) IVPB 3.375 g     3.375 g 12.5 mL/hr over 240 Minutes Intravenous Every 8 hours 07/24/17 1055     07/24/17 1100  piperacillin-tazobactam (ZOSYN) IVPB 3.375 g  Status:  Discontinued     3.375 g 100 mL/hr over 30 Minutes  Intravenous  Once 07/24/17 1050 07/24/17 1056   07/24/17 1100  piperacillin-tazobactam (ZOSYN) IVPB 3.375 g     3.375 g 100 mL/hr over 30 Minutes Intravenous  Once 07/24/17 1056 07/24/17 1134     Subjective: Seen and examined was not feeling well today.  Denies any chest pain, shortness breath but felt nauseous slightly did not want to eat.  Does not know how the leg still swollen but states he has a history of cellulitis in the left leg.  No other concerns or complaints at this time.  Objective: Vitals:   07/25/17 0139 07/25/17 0340 07/25/17 0524 07/25/17 1314  BP: (!) 148/88  (!) 115/56 132/64  Pulse: 97  84 92  Resp: 20  16 20   Temp: (!) 101 F (38.3 C) 100.3 F (37.9 C) 100.3 F (37.9 C) (!) 102 F (38.9 C)  TempSrc: Oral Oral Oral Oral  SpO2: 100%  93% 94%  Weight:      Height:        Intake/Output Summary (Last 24 hours) at 07/25/2017 1436 Last data filed at 07/25/2017 1610 Gross per 24 hour  Intake 530 ml  Output 325 ml  Net 205 ml   Filed Weights   07/24/17 1000  Weight: 112.5 kg (248 lb)   Examination: Physical Exam:  Constitutional: WN/WD obese Caucasian male appears uncomfortable Eyes: Lids and conjunctivae normal, sclerae anicteric  ENMT: External Ears, Nose appear normal. Grossly normal hearing. Mucous membranes  are slightly dry.  Neck: Appears normal, supple, no cervical masses, normal ROM, no appreciable thyromegaly; no JVD Respiratory: Diminished to auscultation bilaterally, no wheezing, rales, rhonchi or crackles. Normal respiratory effort and patient is not tachypenic. No accessory muscle use.  Cardiovascular: RRR, no murmurs / rubs / gallops. S1 and S2 auscultated. Has edema bilaterally but slightly worse in the left with erythema and warmth  Abdomen: Soft,Tender to palpate Lower Quadrants, Distended due to body habitus. No masses palpated. No appreciable hepatosplenomegaly. Bowel sounds positive x4.  GU: Deferred. Musculoskeletal: No clubbing / cyanosis  of digits/nails. No joint deformity upper and lower extremities.  Skin: Left lower extremity with warmth, swelling, erythema.  No appreciable rashes or lesions on limited skin evaluation Neurologic: CN 2-12 grossly intact with no focal deficits.  Romberg sign and cerebellar reflexes not assessed.  Psychiatric: Normal judgment and insight. Alert and oriented x 3. Normal mood and appropriate affect.   Data Reviewed: I have personally reviewed following labs and imaging studies  CBC: Recent Labs  Lab 07/22/17 1031 07/24/17 1018 07/25/17 0358  WBC 12.8* 19.5* 17.5*  NEUTROABS  --  16.4* 14.8*  HGB 13.5 13.2 11.7*  HCT 39.0 38.0* 35.6*  MCV 84 82.8 85.2  PLT 270 267 252   Basic Metabolic Panel: Recent Labs  Lab 07/22/17 1031 07/24/17 0627 07/24/17 1018 07/25/17 0358  NA 139  --  137 136  K 3.4* 3.4* 3.2* 3.1*  CL 102  --  103 103  CO2 22  --  24 24  GLUCOSE 105*  --  150* 137*  BUN 13  --  12 12  CREATININE 1.29*  --  1.34* 1.33*  CALCIUM 8.5*  --  8.0* 7.9*  MG  --   --   --  1.7   GFR: Estimated Creatinine Clearance: 62.1 mL/min (A) (by C-G formula based on SCr of 1.33 mg/dL (H)). Liver Function Tests: Recent Labs  Lab 07/24/17 1018  AST 22  ALT 18  ALKPHOS 89  BILITOT 0.9  PROT 7.5  ALBUMIN 3.2*   No results for input(s): LIPASE, AMYLASE in the last 168 hours. No results for input(s): AMMONIA in the last 168 hours. Coagulation Profile: Recent Labs  Lab 07/24/17 1018  INR 1.18   Cardiac Enzymes: No results for input(s): CKTOTAL, CKMB, CKMBINDEX, TROPONINI in the last 168 hours. BNP (last 3 results) Recent Labs    06/18/17 1001  PROBNP 274   HbA1C: No results for input(s): HGBA1C in the last 72 hours. CBG: Recent Labs  Lab 07/24/17 0605  GLUCAP 161*   Lipid Profile: No results for input(s): CHOL, HDL, LDLCALC, TRIG, CHOLHDL, LDLDIRECT in the last 72 hours. Thyroid Function Tests: No results for input(s): TSH, T4TOTAL, FREET4, T3FREE, THYROIDAB  in the last 72 hours. Anemia Panel: No results for input(s): VITAMINB12, FOLATE, FERRITIN, TIBC, IRON, RETICCTPCT in the last 72 hours. Sepsis Labs: Recent Labs  Lab 07/24/17 1024  LATICACIDVEN 1.71    No results found for this or any previous visit (from the past 240 hour(s)).   Radiology Studies: Ct Abdomen Pelvis W Contrast  Result Date: 07/24/2017 CLINICAL DATA:  Elevated white blood cell count. Lower abdominal pain and nausea for a month. Fever. EXAM: CT ABDOMEN AND PELVIS WITH CONTRAST TECHNIQUE: Multidetector CT imaging of the abdomen and pelvis was performed using the standard protocol following bolus administration of intravenous contrast. CONTRAST:  100 ml ISOVUE-300 IOPAMIDOL (ISOVUE-300) INJECTION 61% COMPARISON:  Chest and two views abdomen this same day.  CT chest 06/18/2017. FINDINGS: Lower chest: The patient has a small left pleural effusion just a convex border. Surrounding pleura is thickened and there are septations within this collection. There is mild bibasilar atelectasis. No right pleural effusion or pericardial effusion. Cardiomegaly is identified. Calcific coronary artery disease is noted. The patient is status post CABG. Hepatobiliary: No focal liver abnormality is seen. No gallstones, gallbladder wall thickening, or biliary dilatation. Pancreas: Unremarkable. No pancreatic ductal dilatation or surrounding inflammatory changes. Spleen: Normal in size without focal abnormality. Adrenals/Urinary Tract: Left renal cyst is noted. Tiny intermediate attenuating lesion off the lower pole of the right kidney is likely a cyst but cannot be definitively characterized. The kidneys are otherwise unremarkable. Ureters and urinary bladder appear normal. Adrenal glands are normal. Stomach/Bowel: There is sigmoid diverticulosis with some stranding about the proximal mid sigmoid colon consistent with diverticulitis. No abscess. The stomach, small bowel and appendix appear normal.  Vascular/Lymphatic: Aortic atherosclerosis. No enlarged abdominal or pelvic lymph nodes. Reproductive: Prostate is unremarkable. Other: Trace amount of free pelvic fluid is identified. Small fat containing inguinal hernias noted. Musculoskeletal: No fracture or worrisome lesion. IMPRESSION: Sigmoid diverticulitis without abscess or perforation. Small left pleural effusion with marked pleural thickening and septations could be due to infectious process such as empyema but could also represent pleural based neoplasm. Tissue sampling could be performed for further evaluation. Calcific aortic and coronary atherosclerosis. The patient is status post CABG. Small fat containing bilateral inguinal hernias. Electronically Signed   By: Drusilla Kannerhomas  Dalessio M.D.   On: 07/24/2017 12:59   Dg Chest Port 1 View  Result Date: 07/24/2017 CLINICAL DATA:  Fever today, shortness of breath and abdominal pain. EXAM: PORTABLE CHEST 1 VIEW COMPARISON:  Chest x-ray dated 12/08/2013. FINDINGS: Stable cardiomegaly. Overall cardiomediastinal silhouette appears stable. Chronic atelectasis/scarring within the LEFT perihilar lung. Mild interstitial prominence bilaterally. Small LEFT pleural effusion and/or pleural thickening. Osseous structures about the chest are unremarkable. IMPRESSION: 1. Stable cardiomegaly. 2. Mild interstitial prominence bilaterally indicating mild CHF/volume and/or atypical pneumonia. 3. Small LEFT pleural effusion versus chronic pleural thickening. Electronically Signed   By: Bary RichardStan  Maynard M.D.   On: 07/24/2017 10:56   Scheduled Meds: . aspirin EC  81 mg Oral QHS  . enoxaparin (LOVENOX) injection  40 mg Subcutaneous Q24H  . pramipexole  1.5 mg Oral QHS  . sodium chloride flush  3 mL Intravenous Q12H  . sodium chloride flush  3 mL Intravenous Q12H   Continuous Infusions: . sodium chloride    . sodium chloride    . piperacillin-tazobactam (ZOSYN)  IV Stopped (07/25/17 1303)    LOS: 1 day   Merlene Laughtermair Latif  Cylee Dattilo, DO [facility] Pager [phone]  If 7PM-7AM, please contact night-coverage [email or web address] Password [email or web address] 07/25/2017, 2:36 PM

## 2017-07-26 LAB — PHOSPHORUS: Phosphorus: 2.4 mg/dL — ABNORMAL LOW (ref 2.5–4.6)

## 2017-07-26 LAB — COMPREHENSIVE METABOLIC PANEL
ALBUMIN: 2.7 g/dL — AB (ref 3.5–5.0)
ALT: 19 U/L (ref 17–63)
AST: 17 U/L (ref 15–41)
Alkaline Phosphatase: 95 U/L (ref 38–126)
Anion gap: 9 (ref 5–15)
BUN: 13 mg/dL (ref 6–20)
CHLORIDE: 104 mmol/L (ref 101–111)
CO2: 24 mmol/L (ref 22–32)
Calcium: 7.9 mg/dL — ABNORMAL LOW (ref 8.9–10.3)
Creatinine, Ser: 1.48 mg/dL — ABNORMAL HIGH (ref 0.61–1.24)
GFR calc Af Amer: 53 mL/min — ABNORMAL LOW (ref >60)
GFR calc non Af Amer: 46 mL/min — ABNORMAL LOW (ref >60)
GLUCOSE: 162 mg/dL — AB (ref 65–99)
POTASSIUM: 3.5 mmol/L (ref 3.5–5.1)
Sodium: 137 mmol/L (ref 135–145)
Total Bilirubin: 1.2 mg/dL (ref 0.3–1.2)
Total Protein: 6.7 g/dL (ref 6.5–8.1)

## 2017-07-26 LAB — CBC WITH DIFFERENTIAL/PLATELET
BASOS ABS: 0 10*3/uL (ref 0.0–0.1)
Basophils Relative: 0 %
Eosinophils Absolute: 0.3 10*3/uL (ref 0.0–0.7)
Eosinophils Relative: 1 %
HCT: 37.3 % — ABNORMAL LOW (ref 39.0–52.0)
Hemoglobin: 12.6 g/dL — ABNORMAL LOW (ref 13.0–17.0)
LYMPHS ABS: 0.7 10*3/uL (ref 0.7–4.0)
Lymphocytes Relative: 3 %
MCH: 28.8 pg (ref 26.0–34.0)
MCHC: 33.8 g/dL (ref 30.0–36.0)
MCV: 85.4 fL (ref 78.0–100.0)
MONO ABS: 2 10*3/uL — AB (ref 0.1–1.0)
MONOS PCT: 8 %
Neutro Abs: 21.9 10*3/uL — ABNORMAL HIGH (ref 1.7–7.7)
Neutrophils Relative %: 88 %
PLATELETS: 267 10*3/uL (ref 150–400)
RBC: 4.37 MIL/uL (ref 4.22–5.81)
RDW: 13.8 % (ref 11.5–15.5)
WBC: 24.9 10*3/uL — AB (ref 4.0–10.5)

## 2017-07-26 LAB — GLUCOSE, CAPILLARY
GLUCOSE-CAPILLARY: 146 mg/dL — AB (ref 65–99)
GLUCOSE-CAPILLARY: 101 mg/dL — AB (ref 65–99)
Glucose-Capillary: 138 mg/dL — ABNORMAL HIGH (ref 65–99)
Glucose-Capillary: 125 mg/dL — ABNORMAL HIGH (ref 65–99)

## 2017-07-26 LAB — PROCALCITONIN: PROCALCITONIN: 0.72 ng/mL

## 2017-07-26 LAB — LACTIC ACID, PLASMA
Lactic Acid, Venous: 1.8 mmol/L (ref 0.5–1.9)
Lactic Acid, Venous: 1.1 mmol/L (ref 0.5–1.9)

## 2017-07-26 LAB — MAGNESIUM: Magnesium: 1.7 mg/dL (ref 1.7–2.4)

## 2017-07-26 MED ORDER — POTASSIUM PHOSPHATES 15 MMOLE/5ML IV SOLN
20.0000 mmol | Freq: Once | INTRAVENOUS | Status: AC
  Administered 2017-07-26: 20 mmol via INTRAVENOUS
  Filled 2017-07-26: qty 6.67

## 2017-07-26 MED ORDER — MORPHINE SULFATE (PF) 2 MG/ML IV SOLN
2.0000 mg | INTRAVENOUS | Status: DC | PRN
  Administered 2017-07-26 – 2017-07-27 (×5): 2 mg via INTRAVENOUS
  Filled 2017-07-26 (×5): qty 1

## 2017-07-26 MED ORDER — PROMETHAZINE HCL 25 MG/ML IJ SOLN
12.5000 mg | Freq: Four times a day (QID) | INTRAMUSCULAR | Status: DC | PRN
  Administered 2017-07-26: 12.5 mg via INTRAVENOUS
  Filled 2017-07-26: qty 1

## 2017-07-26 MED ORDER — SODIUM CHLORIDE 0.9 % IV BOLUS
1000.0000 mL | Freq: Once | INTRAVENOUS | Status: AC
  Administered 2017-07-26: 1000 mL via INTRAVENOUS

## 2017-07-26 NOTE — Progress Notes (Signed)
Nutrition Brief Note  Patient identified on the Malnutrition Screening Tool (MST) Report  Weight flucuating with history of CHF and CKD. Weight loss is most likely related to fluid loss. Pt with history of volume overload during a recent hospital stay last month.  Will monitor for further diet advancement and any needs at that time.  Wt Readings from Last 15 Encounters:  07/26/17 255 lb 1.2 oz (115.7 kg)  07/24/17 248 lb (112.5 kg)  07/08/17 283 lb (128.4 kg)  07/07/17 262 lb 8 oz (119.1 kg)  06/26/17 283 lb 1.1 oz (128.4 kg)  06/18/17 283 lb (128.4 kg)  12/24/16 268 lb 12.8 oz (121.9 kg)  11/13/15 260 lb 4 oz (118 kg)  10/23/15 252 lb (114.3 kg)  11/28/14 254 lb 12.8 oz (115.6 kg)  04/24/14 246 lb 6.4 oz (111.8 kg)  01/30/14 267 lb (121.1 kg)  01/09/14 267 lb (121.1 kg)  12/08/13 273 lb (123.8 kg)  11/16/13 265 lb (120.2 kg)    Body mass index is 37.67 kg/m. Patient meets criteria for obesity based on current BMI.   Current diet order is clear liquids.Labs and medications reviewed.   No nutrition interventions warranted at this time. If nutrition issues arise, please consult RD.   Tilda FrancoLindsey Traven Davids, MS, RD, LDN [facility] Clinical Dietitian Pager: [phone] After Hours Pager: [phone]

## 2017-07-26 NOTE — Progress Notes (Signed)
PROGRESS NOTE    James [name]  ZOX:[id number] DOB: 1945-04-09 DOA: 07/24/2017 PCP: Joycelyn Rua, MD   Brief Narrative:  James [name] is a 72 y.o. male with medical history significant for coronary artery disease, chronic systolic CHF, hypertension, and recent lower extremity cellulitis treated with antibiotics and complicated by diarrhea which has since resolved, now presenting to the emergency department for evaluation of fevers and lower abdominal discomfort. The patient has been experiencing fatigue recently, prompting outpatient stress test that was found to be abnormal, and he presented to the hospital today for coronary catheterization.  He was found to be febrile, was complaining of lower abdominal pain, labs were collected and he was noted to have a marked leukocytosis so catheterization was canceled, and he was directed to the ED.  Patient denied melena or hematochezia, reports that he was recently experiencing diarrhea, but that has essentially resolved and he is now having approximately 1 loose stool daily.  He denies chest pain, palpitations, shortness breath, or cough.  Reports that his chronic bilateral lower extremity edema is better than usual.  Upon arrival to the ED,  Chest x-ray was notable for stable cardiomegaly and mild interstitial prominence concerning for mild CHF or atypical pneumonia.  CT of the abdomen and pelvis reveals sigmoid diverticulitis without abscess or perforation.  Also noted on the CT is small left pleural effusion with marked pleural thickening concerning for possible infection versus neoplasm. Patient was admitted for ongoing evaluation and management of acute sigmoid diverticulitis and subsequently found to have a Swollen and Erythematous Left Leg. Continues to be febrile and was Nauseous and vomiting and is still complaining of abdominal pain.   Assessment & Plan:   Principal Problem:   Diverticulitis of sigmoid colon Active Problems:   Hypertension  Coronary artery disease   Chronic systolic CHF (congestive heart failure) (HCC)   CKD (chronic kidney disease), stage III (HCC)   Pleural thickening  Acute Sigmoid Diverticulitis  -Presented with lower abdominal pain and fever, found to have leukocytosis to 19k with normal lactate  -CT abd/pelvis reveals sigmoid diverticulitis without abscess or perforation  -Blood cultures collected in ED, 1 liter NS given, and empiric Zosyn started -Blood cultures x2 are still pending -Patient Spiked another Temperature of 101.8 this AM  -Repeat 1 Liter of NS yesterday and restarted IVF at 75 mL/hr; Will give 2 liter boluses today  -WBC went from 19.5 -> 17.5 -> 24.9 -Check GI Pathogen Panel and place on Enteric Precautions and if continues to worsen consider C Difficile  -Continue Zosyn, follow cultures and clinical course, continue symptomatic care with prn antiemetic and analgesia, advance diet as tolerated   -Continue clear liquid diet or now if patient can tolerate it -Check Lactic Acid Level and Procalcitonin Level -LA was 1.8 and improved to 1.1 -Procalcitonin Level was 0.72 -Repeat CBC in AM  Left Leg Swelling and Erythema with Concern for Cellulitis  -Left Leg with Swelling, Warmth, and Erythema and is bigger than Right Leg -C/w IV Zosyn as above -Have asked Nurse to mark area -Obtain LE Venous Duplex to r/o DVT and showed no DVT -Improved slightly -Elevate Legs  CAD status post CABG -No Anginal complaints currently  -Had been experiencing fatigue recently, prompting outpatient stress test which was abnormal and he was planned for cath on day of admit, cancelled d/t fever and marked Leukocytosis -Myocardial Perfusion Imaging showed Abnormal stress test with anteroapical ischemia and EF now reduced by echo and stress test -Of note,  he reports resolution of the fatigue after losing 20 lbs recently d/t diarrhea -CT scan showed calcified aortic and coronary atherosclerosis -Continue Daily  ASA 81   -Will need Cardiac Catheterization once Infection is improved   Chronic Systolic CHF  -Appears well-compensated and on the dry side,  -Weight down significantly after recent diarrhea  -Strict I's/O's and Daily Weights -Continue to Monitor Volume Status Carefully as patient was started on IVF Hydration with NS at 75 mL/hr and given boluses  -Patient is +4.131 Liters and Weight is up 7 lbs  CKD stage III  -SCr is 1.34 on admission, consistent with his apparent baseline  -Renally-dose medications, avoid nephrotoxins -Started gentle IVF Hydration with NS at 75 mL/hr -BUN/Cr went from 12/1.33 -> 13/1.48 -Continue to Monitor and Repeat CMP in AM  Hypokalemia -Serum potassium was 3.2 on admission; Now is 3.5 -Replete with po Potassium Chloride 20 mEQ this AM and IV KCl 30 mEQ yesterday -Mag Level was 1.7 -Continue to monitor and replete as necessary -Repeat CMP in a.m.  Left pleural effusion/pleural thickening  -Incidentally noted on CT and noted to be a small left pleural effusion just a convex border with surrounding pleura that is thickened and there are septations within this collection -Concerning for infection such as empyema or a pleural-based neoplasm  -No s/s of pulmonary infection  -Recommend close outpatient follow up and tissue sampling to be performed for further evaluation as an outpatient  Normocytic Anemia -Patient's hemoglobin/hematocrit went from 13.2/30.0 and is now 12.6/37.3 -Likely dilutional drop and in the setting of CKD -We will check anemia panel in the a.m. -Continue to monitor for signs and symptoms of bleeding -Repeat CBC in AM.  Hyperglycemia in the setting of Previously Diagnosed Diabetes Mellitus -Check HbA1c; Last HbA1c was 7.8 several years ago -CMP this AM showed blood sugar of 137 -Continue to Monitor CBG's -Will add Sensitive Novolog SSI AC -CBG ranging from 125-146  Hypophosphatemia -Patient's Phos Level was 2.4 -Replet with  Phosphate 20 mmol IV  -Continue to Monitor and Replete as Necessary -Repeat Phos Level in AM  DVT prophylaxis: Enoxaparin 40 mg sq q24h Code Status: FULL CODE Family Communication: No family present at bedside  Disposition Plan: Remain Inpatient for current workup and treatment   Consultants:   None   Procedures:  LE VENOUS DUPLEX There is no DVT or SVT noted in the bilateral lower extremities.    Antimicrobials:  Anti-infectives (From admission, onward)   Start     Dose/Rate Route Frequency Ordered Stop   07/24/17 1800  piperacillin-tazobactam (ZOSYN) IVPB 3.375 g     3.375 g 12.5 mL/hr over 240 Minutes Intravenous Every 8 hours 07/24/17 1055     07/24/17 1100  piperacillin-tazobactam (ZOSYN) IVPB 3.375 g  Status:  Discontinued     3.375 g 100 mL/hr over 30 Minutes Intravenous  Once 07/24/17 1050 07/24/17 1056   07/24/17 1100  piperacillin-tazobactam (ZOSYN) IVPB 3.375 g     3.375 g 100 mL/hr over 30 Minutes Intravenous  Once 07/24/17 1056 07/24/17 1134     Subjective: Seen and examined and still not feeling well and was nauseous and vomiting last night. Still not wanting to eat. Still having abdominal pain and loose bowel sounds.   Objective: Vitals:   07/26/17 0116 07/26/17 0343 07/26/17 0345 07/26/17 1505  BP:  135/69  139/75  Pulse:  94  97  Resp:  (!) 24  (!) 24  Temp: (!) 100.5 F (38.1 C) 100.1 F (37.8 C)  (!)  101 F (38.3 C)  TempSrc: Oral Oral  Oral  SpO2:  96%  95%  Weight:   115.7 kg (255 lb 1.2 oz)   Height:        Intake/Output Summary (Last 24 hours) at 07/26/2017 2013 Last data filed at 07/26/2017 1846 Gross per 24 hour  Intake 1677.5 ml  Output -  Net 1677.5 ml   Filed Weights   07/24/17 1000 07/26/17 0345  Weight: 112.5 kg (248 lb) 115.7 kg (255 lb 1.2 oz)   Examination: Physical Exam:  Constitutional: WN/WD obese Caucasian male appears uncomfortable and appears ill Eyes:  Lids and conjunctivae normal; Anicteric sclerae ENMT:   External Ears and nose appear normal. Grossly normal hearing. MMM are still somewhat dry Neck: Supple with no JVD Respiratory: Diminished to auscultation bilaterally; No appreciable wheezing, rales, rhonchi. Patient was not tachypenic or using any accessory muscles to breathe  Cardiovascular: RRR; No appreciable m/r/g. Left leg still more swollen than Right  Abdomen: Soft, Tender to palpate in the Lower Abdomen. Distended 2/2 body habitus. Bowel Sounds present x4 GU: Deferred Musculoskeletal: No clubbing or cyanosis. No joint deformities noted Skin: Warm and Dry. Left lower extremity with warmth and swelling but erythema looks like it is improving Neurologic: CN 2-12 grossly intact. No appreciable focal deficits Psychiatric: Normal mood and affect. Intact Judgement and insight. Alert and Oriented x3  Data Reviewed: I have personally reviewed following labs and imaging studies  CBC: Recent Labs  Lab 07/22/17 1031 07/24/17 1018 07/25/17 0358 07/26/17 0348  WBC 12.8* 19.5* 17.5* 24.9*  NEUTROABS  --  16.4* 14.8* 21.9*  HGB 13.5 13.2 11.7* 12.6*  HCT 39.0 38.0* 35.6* 37.3*  MCV 84 82.8 85.2 85.4  PLT 270 267 252 267   Basic Metabolic Panel: Recent Labs  Lab 07/22/17 1031 07/24/17 0627 07/24/17 1018 07/25/17 0358 07/26/17 0348  NA 139  --  137 136 137  K 3.4* 3.4* 3.2* 3.1* 3.5  CL 102  --  103 103 104  CO2 22  --  24 24 24   GLUCOSE 105*  --  150* 137* 162*  BUN 13  --  12 12 13   CREATININE 1.29*  --  1.34* 1.33* 1.48*  CALCIUM 8.5*  --  8.0* 7.9* 7.9*  MG  --   --   --  1.7 1.7  PHOS  --   --   --   --  2.4*   GFR: Estimated Creatinine Clearance: 56.6 mL/min (A) (by C-G formula based on SCr of 1.48 mg/dL (H)). Liver Function Tests: Recent Labs  Lab 07/24/17 1018 07/26/17 0348  AST 22 17  ALT 18 19  ALKPHOS 89 95  BILITOT 0.9 1.2  PROT 7.5 6.7  ALBUMIN 3.2* 2.7*   No results for input(s): LIPASE, AMYLASE in the last 168 hours. No results for input(s): AMMONIA  in the last 168 hours. Coagulation Profile: Recent Labs  Lab 07/24/17 1018  INR 1.18   Cardiac Enzymes: No results for input(s): CKTOTAL, CKMB, CKMBINDEX, TROPONINI in the last 168 hours. BNP (last 3 results) Recent Labs    06/18/17 1001  PROBNP 274   HbA1C: Recent Labs    07/25/17 0358  HGBA1C 8.4*   CBG: Recent Labs  Lab 07/25/17 1644 07/25/17 2157 07/26/17 0757 07/26/17 1120 07/26/17 1632  GLUCAP 141* 139* 138* 125* 146*   Lipid Profile: No results for input(s): CHOL, HDL, LDLCALC, TRIG, CHOLHDL, LDLDIRECT in the last 72 hours. Thyroid Function Tests: No results for input(s):  TSH, T4TOTAL, FREET4, T3FREE, THYROIDAB in the last 72 hours. Anemia Panel: No results for input(s): VITAMINB12, FOLATE, FERRITIN, TIBC, IRON, RETICCTPCT in the last 72 hours. Sepsis Labs: Recent Labs  Lab 07/24/17 1024 07/26/17 1110 07/26/17 1423  PROCALCITON  --  0.72  --   LATICACIDVEN 1.71 1.8 1.1    Recent Results (from the past 240 hour(s))  Culture, blood (Routine x 2)     Status: None (Preliminary result)   Collection Time: 07/24/17 10:18 AM  Result Value Ref Range Status   Specimen Description   Final    BLOOD RIGHT ARM Performed at [facility], [address] [place], [place] [address]    Special Requests   Final    BOTTLES DRAWN AEROBIC AND ANAEROBIC Blood Culture adequate volume Performed at [facility], [address] [place], [place] [address]    Culture   Final    NO GROWTH 2 DAYS Performed at [facility], [address], [place], [place] [address]    Report Status PENDING  Incomplete  Culture, blood (Routine x 2)     Status: None (Preliminary result)   Collection Time: 07/24/17 11:00 AM  Result Value Ref Range Status   Specimen Description   Final    BLOOD RIGHT FOREARM Performed at [facility] [address] [place], [place] [address]    Special Requests   Final    BOTTLES DRAWN AEROBIC AND ANAEROBIC Blood  Culture adequate volume Performed at [facility], [address] [place], [place] [address]    Culture   Final    NO GROWTH 2 DAYS Performed at [facility], [address], [place], [place] [address]    Report Status PENDING  Incomplete     Radiology Studies: No results found. Scheduled Meds: . aspirin EC  81 mg Oral QHS  . enoxaparin (LOVENOX) injection  40 mg Subcutaneous Q24H  . insulin aspart  0-9 Units Subcutaneous TID WC  . pramipexole  1.5 mg Oral QHS  . sodium chloride flush  3 mL Intravenous Q12H  . sodium chloride flush  3 mL Intravenous Q12H   Continuous Infusions: . sodium chloride    . sodium chloride 75 mL/hr at 07/26/17 1603  . piperacillin-tazobactam (ZOSYN)  IV 3.375 g (07/26/17 1747)  . sodium chloride      LOS: 2 days   Merlene Laughter, DO [facility] Pager [phone]  If 7PM-7AM, please contact night-coverage [email or web address] Password [facility] 07/26/2017, 8:13 PM

## 2017-07-27 DIAGNOSIS — E876 Hypokalemia: Secondary | ICD-10-CM

## 2017-07-27 LAB — GASTROINTESTINAL PANEL BY PCR, STOOL (REPLACES STOOL CULTURE)

## 2017-07-27 LAB — COMPREHENSIVE METABOLIC PANEL
ALBUMIN: 2.1 g/dL — AB (ref 3.5–5.0)
ALT: 16 U/L — AB (ref 17–63)
AST: 16 U/L (ref 15–41)
Alkaline Phosphatase: 115 U/L (ref 38–126)
Anion gap: 10 (ref 5–15)
BILIRUBIN TOTAL: 0.6 mg/dL (ref 0.3–1.2)
BUN: 14 mg/dL (ref 6–20)
CHLORIDE: 105 mmol/L (ref 101–111)
CO2: 21 mmol/L — ABNORMAL LOW (ref 22–32)
Calcium: 7.6 mg/dL — ABNORMAL LOW (ref 8.9–10.3)
Creatinine, Ser: 1.37 mg/dL — ABNORMAL HIGH (ref 0.61–1.24)
GFR calc Af Amer: 58 mL/min — ABNORMAL LOW (ref >60)
GFR calc non Af Amer: 50 mL/min — ABNORMAL LOW (ref >60)
GLUCOSE: 122 mg/dL — AB (ref 65–99)
POTASSIUM: 3 mmol/L — AB (ref 3.5–5.1)
Sodium: 136 mmol/L (ref 135–145)
Total Protein: 5.8 g/dL — ABNORMAL LOW (ref 6.5–8.1)

## 2017-07-27 LAB — GLUCOSE, CAPILLARY
GLUCOSE-CAPILLARY: 116 mg/dL — AB (ref 65–99)
Glucose-Capillary: 113 mg/dL — ABNORMAL HIGH (ref 65–99)
Glucose-Capillary: 145 mg/dL — ABNORMAL HIGH (ref 65–99)

## 2017-07-27 LAB — CBC WITH DIFFERENTIAL/PLATELET
BASOS ABS: 0 10*3/uL (ref 0.0–0.1)
BASOS PCT: 0 %
Eosinophils Absolute: 0.5 10*3/uL (ref 0.0–0.7)
Eosinophils Relative: 3 %
HEMATOCRIT: 35.1 % — AB (ref 39.0–52.0)
Hemoglobin: 11.7 g/dL — ABNORMAL LOW (ref 13.0–17.0)
Lymphocytes Relative: 4 %
Lymphs Abs: 0.8 10*3/uL (ref 0.7–4.0)
MCH: 28.3 pg (ref 26.0–34.0)
MCHC: 33.3 g/dL (ref 30.0–36.0)
MCV: 85 fL (ref 78.0–100.0)
MONO ABS: 1.5 10*3/uL — AB (ref 0.1–1.0)
Monocytes Relative: 7 %
NEUTROS ABS: 18.3 10*3/uL — AB (ref 1.7–7.7)
Neutrophils Relative %: 86 %
PLATELETS: 275 10*3/uL (ref 150–400)
RBC: 4.13 MIL/uL — AB (ref 4.22–5.81)
RDW: 13.9 % (ref 11.5–15.5)
WBC: 21.2 10*3/uL — AB (ref 4.0–10.5)

## 2017-07-27 LAB — PHOSPHORUS: Phosphorus: 2.3 mg/dL — ABNORMAL LOW (ref 2.5–4.6)

## 2017-07-27 LAB — MAGNESIUM: Magnesium: 1.7 mg/dL (ref 1.7–2.4)

## 2017-07-27 LAB — PROCALCITONIN: PROCALCITONIN: 0.72 ng/mL

## 2017-07-27 MED ORDER — POTASSIUM CHLORIDE 10 MEQ/100ML IV SOLN
10.0000 meq | INTRAVENOUS | Status: DC
  Administered 2017-07-27: 10 meq via INTRAVENOUS
  Filled 2017-07-27: qty 100

## 2017-07-27 MED ORDER — DEXTROSE 5 % IV SOLN
20.0000 mmol | Freq: Once | INTRAVENOUS | Status: AC
  Administered 2017-07-27: 20 mmol via INTRAVENOUS
  Filled 2017-07-27: qty 6.67

## 2017-07-27 MED ORDER — MAGNESIUM SULFATE 2 GM/50ML IV SOLN
2.0000 g | Freq: Once | INTRAVENOUS | Status: AC
  Administered 2017-07-27: 2 g via INTRAVENOUS
  Filled 2017-07-27: qty 50

## 2017-07-27 MED ORDER — POTASSIUM CHLORIDE CRYS ER 20 MEQ PO TBCR
40.0000 meq | EXTENDED_RELEASE_TABLET | Freq: Two times a day (BID) | ORAL | Status: DC
  Administered 2017-07-27 (×2): 40 meq via ORAL
  Filled 2017-07-27 (×2): qty 2

## 2017-07-27 NOTE — Progress Notes (Signed)
PROGRESS NOTE    [name]  ZOX:[id number] DOB: 02/01/1946 DOA: 07/24/2017 PCP: Joycelyn Rua, MD   Brief Narrative:  James [name] is a 72 y.o. male with medical history significant for coronary artery disease, chronic systolic CHF, hypertension, and recent lower extremity cellulitis treated with antibiotics and complicated by diarrhea which has since resolved, now presenting to the emergency department for evaluation of fevers and lower abdominal discomfort. The patient has been experiencing fatigue recently, prompting outpatient stress test that was found to be abnormal, and he presented to the hospital today for coronary catheterization.  He was found to be febrile, was complaining of lower abdominal pain, labs were collected and he was noted to have a marked leukocytosis so catheterization was canceled, and he was directed to the ED.  Patient denied melena or hematochezia, reports that he was recently experiencing diarrhea, but that has essentially resolved and he is now having approximately 1 loose stool daily.  He denies chest pain, palpitations, shortness breath, or cough.  Reports that his chronic bilateral lower extremity edema is better than usual.  Upon arrival to the ED,  Chest x-ray was notable for stable cardiomegaly and mild interstitial prominence concerning for mild CHF or atypical pneumonia.  CT of the abdomen and pelvis reveals sigmoid diverticulitis without abscess or perforation.  Also noted on the CT is small left pleural effusion with marked pleural thickening concerning for possible infection versus neoplasm. Patient was admitted for ongoing evaluation and management of acute sigmoid diverticulitis and subsequently found to have a Swollen and Erythematous Left Leg. Continues to be febrile intermittently and was Nauseous and vomiting and is still complaining of abdominal pain but is improving   Assessment & Plan:   Principal Problem:   Diverticulitis of sigmoid  colon Active Problems:   Hypertension   Coronary artery disease   Chronic systolic CHF (congestive heart failure) (HCC)   CKD (chronic kidney disease), stage III (HCC)   Pleural thickening  Acute Sigmoid Diverticulitis  -Presented with lower abdominal pain and fever, found to have leukocytosis to 19k with normal lactate  -CT abd/pelvis reveals sigmoid diverticulitis without abscess or perforation  -Blood cultures collected in ED, 1 liter NS given, and empiric Zosyn started -Blood cultures x2 Show NGTD at 3 Days  -Patient spiking intermittent temperatures -S/p Total of 4 Liters of boluses and will keep on IV Maintenance but reduce rate from 75 mL/hr to 50 mL/hr -WBC went from 19.5 -> 17.5 -> 24.9 -> 21.2 -Check GI Pathogen Panel Negative so will D/C Enteric Precautions -Continue Zosyn for now, follow cultures and clinical course, continue symptomatic care with prn antiemetic and analgesia, advance diet as tolerated   -Continue clear liquid diet or now if patient can tolerate it -LA was 1.8 and improved to 1.1 -Procalcitonin Level was 0.72 x2 -Continue to Monitor and Repeat CBC in AM  Left Leg Swelling and Erythema with Concern for Cellulitis, improving -Left Leg with Swelling, Warmth, and Erythema and is bigger than Right Leg -C/w IV Zosyn as above -Have asked Nurse to mark area -Obtain LE Venous Duplex to r/o DVT and showed no DVT -Improved slightly -Elevate Legs  CAD status post CABG -No Anginal complaints currently  -Had been experiencing fatigue recently, prompting outpatient stress test which was abnormal and he was planned for cath on day of admit, cancelled d/t fever and marked Leukocytosis -Myocardial Perfusion Imaging showed Abnormal stress test with anteroapical ischemia and EF now reduced by echo and stress test -Of  note, he reports resolution of the fatigue after losing 20 lbs recently d/t diarrhea -CT scan showed calcified aortic and coronary  atherosclerosis -Continue Daily ASA 81   -Will need Cardiac Catheterization once Infection is improved and close Cardiology Follow up   Chronic Systolic CHF  -Appears well-compensated and was on the dry side but seems more euvolemic today ,  -Weight down significantly after recent diarrhea per patient   -Strict I's/O's and Daily Weights -Continue to Monitor Volume Status Carefully as patient was started on IVF Hydration with NS and given boluses (4 Liters); Rate reduced to 50 mL/hr -Patient is +5.724 Liters and Weight is up 11 lbs  CKD stage III  -SCr is 1.34 on admission, consistent with his apparent baseline  -Renally-dose medications, avoid nephrotoxins -Started gentle IVF Hydration with NS at 75 mL/hr and reduced rate to 50 mL/hr -BUN/Cr went from 12/1.33 -> 13/1.48 -> 14/1.37 -Continue to Monitor and Repeat CMP in AM  Hypokalemia -Serum potassium was 3.20 -Replete with IV KCl but patient was not able to tolerate more than 1 Run of KCl so will give po Potassium Chloride 40 mEQ BID and also replete with IV KPhos 20 mmol  -Mag Level was 1.7 again so  -Continue to monitor and replete as necessary -Repeat CMP in a.m.  Left pleural effusion/pleural thickening  -Incidentally noted on CT and noted to be a small left pleural effusion just a convex border with surrounding pleura that is thickened and there are septations within this collection -Concerning for infection such as empyema or a pleural-based neoplasm  -No s/s of pulmonary infection  -Recommend close outpatient follow up and tissue sampling to be performed for further evaluation as an outpatient -Repeat CXR in AM   Normocytic Anemia -Patient's hemoglobin/hematocrit went from 13.2/30.0 and is now 11.7/35.1 -Likely dilutional drop and in the setting of CKD -We will check anemia panel in the a.m. -Continue to monitor for signs and symptoms of bleeding -Repeat CBC in AM.  Hyperglycemia in the setting of Previously  Diagnosed Diabetes Mellitus -Checked  HbA1c and was 8.4; Last HbA1c was 7.8 several years ago -CMP this AM showed blood sugar of 122 -Continue to Monitor CBG's -Will add Sensitive Novolog SSI AC -CBG ranging from 101-146  Hypophosphatemia -Patient's Phos Level was 2.3 -Replet with Phosphate 20 mmol IV again today  -Continue to Monitor and Replete as Necessary -Repeat Phos Level in AM  DVT prophylaxis: Enoxaparin 40 mg sq q24h Code Status: FULL CODE Family Communication: No family present at bedside  Disposition Plan: Remain Inpatient for current workup and treatment   Consultants:   None   Procedures:  LE VENOUS DUPLEX There is no DVT or SVT noted in the bilateral lower extremities.    Antimicrobials:  Anti-infectives (From admission, onward)   Start     Dose/Rate Route Frequency Ordered Stop   07/24/17 1800  piperacillin-tazobactam (ZOSYN) IVPB 3.375 g     3.375 g 12.5 mL/hr over 240 Minutes Intravenous Every 8 hours 07/24/17 1055     07/24/17 1100  piperacillin-tazobactam (ZOSYN) IVPB 3.375 g  Status:  Discontinued     3.375 g 100 mL/hr over 30 Minutes Intravenous  Once 07/24/17 1050 07/24/17 1056   07/24/17 1100  piperacillin-tazobactam (ZOSYN) IVPB 3.375 g     3.375 g 100 mL/hr over 30 Minutes Intravenous  Once 07/24/17 1056 07/24/17 1134     Subjective: Seen and examined and.  He was feeling a little bit better today.  States that the  diarrhea has been slowing down only had 2 bowel movements since last night.  No chest pain, shortness breath,.  Still has abdominal pain and it comes intermittently.  States that it is a sharp pain and then it slowly goes away.  No other concerns or complaints at this time.  Objective: Vitals:   07/26/17 1505 07/26/17 2035 07/27/17 0418 07/27/17 0420  BP: 139/75 129/90 129/75   Pulse: 97 (!) 108 84   Resp: (!) 24 16 16    Temp: (!) 101 F (38.3 C) 99.7 F (37.6 C) 99.1 F (37.3 C)   TempSrc: Oral Oral Oral   SpO2: 95% 99% 96%    Weight:    117.5 kg (259 lb 0.7 oz)  Height:        Intake/Output Summary (Last 24 hours) at 07/27/2017 1648 Last data filed at 07/27/2017 1400 Gross per 24 hour  Intake 2220 ml  Output -  Net 2220 ml   Filed Weights   07/24/17 1000 07/26/17 0345 07/27/17 0420  Weight: 112.5 kg (248 lb) 115.7 kg (255 lb 1.2 oz) 117.5 kg (259 lb 0.7 oz)   Examination: Physical Exam:  Constitutional: Well-nourished, well-developed obese Caucasian male who appears more comfortable today.  He does not appear sick as he was yesterday. Eyes: Sclera anicteric.  Lids and conjunctive are normal. ENMT: External ears and nose appear normal.  Grossly normal hearing.  Mucous membranes are better today and not as dry. Neck: Supple no JVD Respiratory: Diminished to auscultation bilaterally.  No appreciable wheezing, rales, rhonchi.  Patient was not tachypneic or using accessory muscles to breathe Cardiovascular: Regular rate and rhythm no appreciable murmurs, rubs, gallops.  Has bilateral lower extremity edema with left leg slightly bigger than right and is 1+ lower extremity edema. Abdomen: Soft, tender to palpate in the lower abdomen but not as bad as yesterday.  Distended secondary body habitus.  Bowel sounds present hyperactive GU: Deferred Musculoskeletal: No contractures or cyanosis.  No joint deformities noted Skin: Warm and dry with no appreciable rashes or lesions.  Left leg extremity has some warmth but is improved and swelling has also improved as well as erythema.  Not as erythematous or swollen today.  Left leg is still slightly better than the right Neurologic: Cranial nerves II through XII grossly intact no appreciable focal deficits Psychiatric: Normal mood and affect.  Intact judgment and insight.  Is awake, alert, and oriented x3  Data Reviewed: I have personally reviewed following labs and imaging studies  CBC: Recent Labs  Lab 07/22/17 1031 07/24/17 1018 07/25/17 0358 07/26/17 0348  07/27/17 0415  WBC 12.8* 19.5* 17.5* 24.9* 21.2*  NEUTROABS  --  16.4* 14.8* 21.9* 18.3*  HGB 13.5 13.2 11.7* 12.6* 11.7*  HCT 39.0 38.0* 35.6* 37.3* 35.1*  MCV 84 82.8 85.2 85.4 85.0  PLT 270 267 252 267 275   Basic Metabolic Panel: Recent Labs  Lab 07/22/17 1031 07/24/17 0627 07/24/17 1018 07/25/17 0358 07/26/17 0348 07/27/17 0415  NA 139  --  137 136 137 136  K 3.4* 3.4* 3.2* 3.1* 3.5 3.0*  CL 102  --  103 103 104 105  CO2 22  --  24 24 24  21*  GLUCOSE 105*  --  150* 137* 162* 122*  BUN 13  --  12 12 13 14   CREATININE 1.29*  --  1.34* 1.33* 1.48* 1.37*  CALCIUM 8.5*  --  8.0* 7.9* 7.9* 7.6*  MG  --   --   --  1.7  1.7 1.7  PHOS  --   --   --   --  2.4* 2.3*   GFR: Estimated Creatinine Clearance: 61.6 mL/min (A) (by C-G formula based on SCr of 1.37 mg/dL (H)). Liver Function Tests: Recent Labs  Lab 07/24/17 1018 07/26/17 0348 07/27/17 0415  AST 22 17 16   ALT 18 19 16*  ALKPHOS 89 95 115  BILITOT 0.9 1.2 0.6  PROT 7.5 6.7 5.8*  ALBUMIN 3.2* 2.7* 2.1*   No results for input(s): LIPASE, AMYLASE in the last 168 hours. No results for input(s): AMMONIA in the last 168 hours. Coagulation Profile: Recent Labs  Lab 07/24/17 1018  INR 1.18   Cardiac Enzymes: No results for input(s): CKTOTAL, CKMB, CKMBINDEX, TROPONINI in the last 168 hours. BNP (last 3 results) Recent Labs    06/18/17 1001  PROBNP 274   HbA1C: Recent Labs    07/25/17 0358  HGBA1C 8.4*   CBG: Recent Labs  Lab 07/26/17 1120 07/26/17 1632 07/26/17 2037 07/27/17 0735 07/27/17 1122  GLUCAP 125* 146* 101* 113* 116*   Lipid Profile: No results for input(s): CHOL, HDL, LDLCALC, TRIG, CHOLHDL, LDLDIRECT in the last 72 hours. Thyroid Function Tests: No results for input(s): TSH, T4TOTAL, FREET4, T3FREE, THYROIDAB in the last 72 hours. Anemia Panel: No results for input(s): VITAMINB12, FOLATE, FERRITIN, TIBC, IRON, RETICCTPCT in the last 72 hours. Sepsis Labs: Recent Labs  Lab  07/24/17 1024 07/26/17 1110 07/26/17 1423 07/27/17 0415  PROCALCITON  --  0.72  --  0.72  LATICACIDVEN 1.71 1.8 1.1  --     Recent Results (from the past 240 hour(s))  Culture, blood (Routine x 2)     Status: None (Preliminary result)   Collection Time: 07/24/17 10:18 AM  Result Value Ref Range Status   Specimen Description   Final    BLOOD RIGHT ARM Performed at [facility], [address] [place], [place] [address]    Special Requests   Final    BOTTLES DRAWN AEROBIC AND ANAEROBIC Blood Culture adequate volume Performed at [facility], [address], [place], [place] [address]    Culture   Final    NO GROWTH 3 DAYS Performed at [facility], [address], [place], [place] [address]    Report Status PENDING  Incomplete  Culture, blood (Routine x 2)     Status: None (Preliminary result)   Collection Time: 07/24/17 11:00 AM  Result Value Ref Range Status   Specimen Description   Final    BLOOD RIGHT FOREARM Performed at [facility], [address] [place], [place] [address]    Special Requests   Final    BOTTLES DRAWN AEROBIC AND ANAEROBIC Blood Culture adequate volume Performed at [facility], [address] [place], [place] [address]    Culture   Final    NO GROWTH 3 DAYS Performed at [facility], [address], [place], [place] [address]    Report Status PENDING  Incomplete  Gastrointestinal Panel by PCR , Stool     Status: None   Collection Time: 07/26/17 12:30 PM  Result Value Ref Range Status   Campylobacter species NOT DETECTED NOT DETECTED Final   Plesimonas shigelloides NOT DETECTED NOT DETECTED Final   Salmonella species NOT DETECTED NOT DETECTED Final   Yersinia enterocolitica NOT DETECTED NOT DETECTED Final   Vibrio species NOT DETECTED NOT DETECTED Final   Vibrio cholerae NOT  DETECTED NOT DETECTED Final   Enteroaggregative E coli (EAEC) NOT DETECTED NOT DETECTED Final   Enteropathogenic  E coli (EPEC) NOT DETECTED NOT DETECTED Final   Enterotoxigenic E coli (ETEC) NOT DETECTED NOT DETECTED Final   Shiga like toxin producing E coli (STEC) NOT DETECTED NOT DETECTED Final   Shigella/Enteroinvasive E coli (EIEC) NOT DETECTED NOT DETECTED Final   Cryptosporidium NOT DETECTED NOT DETECTED Final   Cyclospora cayetanensis NOT DETECTED NOT DETECTED Final   Entamoeba histolytica NOT DETECTED NOT DETECTED Final   Giardia lamblia NOT DETECTED NOT DETECTED Final   Adenovirus F40/41 NOT DETECTED NOT DETECTED Final   Astrovirus NOT DETECTED NOT DETECTED Final   Norovirus GI/GII NOT DETECTED NOT DETECTED Final   Rotavirus A NOT DETECTED NOT DETECTED Final   Sapovirus (I, II, IV, and V) NOT DETECTED NOT DETECTED Final    Comment: Performed at [facility], [address] [place], [place] [address]     Radiology Studies: No results found. Scheduled Meds: . aspirin EC  81 mg Oral QHS  . enoxaparin (LOVENOX) injection  40 mg Subcutaneous Q24H  . insulin aspart  0-9 Units Subcutaneous TID WC  . potassium chloride  40 mEq Oral BID  . pramipexole  1.5 mg Oral QHS  . sodium chloride flush  3 mL Intravenous Q12H  . sodium chloride flush  3 mL Intravenous Q12H   Continuous Infusions: . sodium chloride    . sodium chloride 75 mL/hr at 07/27/17 1012  . piperacillin-tazobactam (ZOSYN)  IV Stopped (07/27/17 1311)  . potassium PHOSPHATE IVPB (mmol) 20 mmol (07/27/17 1108)    LOS: 3 days   Merlene Laughter, DO [facility] Pager [phone]  If 7PM-7AM, please contact night-coverage [email or web address] Password [facility] 07/27/2017, 4:48 PM

## 2017-07-27 NOTE — Care Management Important Message (Signed)
Important Message  Patient Details IM Letter given to Cookie/Case Manager to present to the Patient Name: James [name] MRN: [id number] Date of Birth: 06-Mar-1945   Medicare Important Message Given:  Yes    Caren MacadamFuller, Josue Falconi 07/27/2017, 1:20 PMImportant Message  Patient Details  Name: James [name] MRN: [id number] Date of Birth: 06-Mar-1945   Medicare Important Message Given:  Yes    Caren MacadamFuller, Fontella Shan 07/27/2017, 1:19 PM

## 2017-07-28 ENCOUNTER — Encounter: Payer: Self-pay | Admitting: *Deleted

## 2017-07-28 LAB — COMPREHENSIVE METABOLIC PANEL
ALT: 23 U/L (ref 17–63)
AST: 31 U/L (ref 15–41)
Albumin: 2.1 g/dL — ABNORMAL LOW (ref 3.5–5.0)
Alkaline Phosphatase: 89 U/L (ref 38–126)
Anion gap: 9 (ref 5–15)
BILIRUBIN TOTAL: 0.6 mg/dL (ref 0.3–1.2)
BUN: 14 mg/dL (ref 6–20)
CHLORIDE: 107 mmol/L (ref 101–111)
CO2: 21 mmol/L — ABNORMAL LOW (ref 22–32)
CREATININE: 1.25 mg/dL — AB (ref 0.61–1.24)
Calcium: 7.8 mg/dL — ABNORMAL LOW (ref 8.9–10.3)
GFR, EST NON AFRICAN AMERICAN: 56 mL/min — AB (ref >60)
Glucose, Bld: 127 mg/dL — ABNORMAL HIGH (ref 65–99)
Potassium: 3.6 mmol/L (ref 3.5–5.1)
Sodium: 137 mmol/L (ref 135–145)
TOTAL PROTEIN: 6 g/dL — AB (ref 6.5–8.1)

## 2017-07-28 LAB — CBC WITH DIFFERENTIAL/PLATELET
Basophils Absolute: 0 10*3/uL (ref 0.0–0.1)
Basophils Relative: 0 %
EOS PCT: 5 %
Eosinophils Absolute: 0.9 10*3/uL — ABNORMAL HIGH (ref 0.0–0.7)
HEMATOCRIT: 36.5 % — AB (ref 39.0–52.0)
Hemoglobin: 12.2 g/dL — ABNORMAL LOW (ref 13.0–17.0)
LYMPHS ABS: 1 10*3/uL (ref 0.7–4.0)
LYMPHS PCT: 6 %
MCH: 28.1 pg (ref 26.0–34.0)
MCHC: 33.4 g/dL (ref 30.0–36.0)
MCV: 84.1 fL (ref 78.0–100.0)
MONO ABS: 1.6 10*3/uL — AB (ref 0.1–1.0)
Monocytes Relative: 9 %
NEUTROS ABS: 14.3 10*3/uL — AB (ref 1.7–7.7)
Neutrophils Relative %: 80 %
Platelets: 329 10*3/uL (ref 150–400)
RBC: 4.34 MIL/uL (ref 4.22–5.81)
RDW: 14.2 % (ref 11.5–15.5)
WBC: 17.9 10*3/uL — ABNORMAL HIGH (ref 4.0–10.5)

## 2017-07-28 LAB — PROCALCITONIN: Procalcitonin: 0.63 ng/mL

## 2017-07-28 LAB — MAGNESIUM: MAGNESIUM: 2 mg/dL (ref 1.7–2.4)

## 2017-07-28 LAB — GLUCOSE, CAPILLARY
Glucose-Capillary: 105 mg/dL — ABNORMAL HIGH (ref 65–99)
Glucose-Capillary: 116 mg/dL — ABNORMAL HIGH (ref 65–99)
Glucose-Capillary: 131 mg/dL — ABNORMAL HIGH (ref 65–99)
Glucose-Capillary: 138 mg/dL — ABNORMAL HIGH (ref 65–99)

## 2017-07-28 LAB — PHOSPHORUS: PHOSPHORUS: 2.2 mg/dL — AB (ref 2.5–4.6)

## 2017-07-28 MED ORDER — POTASSIUM CHLORIDE CRYS ER 20 MEQ PO TBCR
40.0000 meq | EXTENDED_RELEASE_TABLET | Freq: Once | ORAL | Status: AC
  Administered 2017-07-28: 40 meq via ORAL
  Filled 2017-07-28: qty 2

## 2017-07-28 MED ORDER — K PHOS MONO-SOD PHOS DI & MONO 155-852-130 MG PO TABS
500.0000 mg | ORAL_TABLET | Freq: Two times a day (BID) | ORAL | Status: AC
  Administered 2017-07-28 (×2): 500 mg via ORAL
  Filled 2017-07-28 (×2): qty 2

## 2017-07-28 MED ORDER — HYDRALAZINE HCL 20 MG/ML IJ SOLN
10.0000 mg | Freq: Four times a day (QID) | INTRAMUSCULAR | Status: DC | PRN
  Administered 2017-07-28: 10 mg via INTRAVENOUS
  Filled 2017-07-28: qty 1

## 2017-07-28 NOTE — Evaluation (Signed)
Physical Therapy Evaluation Patient Details Name: James [name] MRN: [id number] DOB: 11/01/1945 Today's Date: 07/28/2017   History of Present Illness  72 y.o. male with medical history significant for coronary artery disease, chronic systolic CHF, hypertension, and recent lower extremity cellulitis treated with antibiotics and complicated by diarrhea which has since resolved, now presenting to the emergency department for evaluation of fevers and lower abdominal discomfort; CT of the abdomen and pelvis reveals sigmoid diverticulitis without abscess or perforation.  Also noted on the CT is small left pleural effusion with marked pleural thickening concerning for possible infection versus neoplasm.    Clinical Impression  On eval, pt was Min guard assist for mobility. He walked ~150 feet with an IV pole. Noted some mild dyspnea with activity. Mildly unsteady. Discussed d/c plan-he plans to return home alone. Will recommend HHPT f/u to ensure safe transition back into home environment. PT will follow during hospital stay.     Follow Up Recommendations Home health PT;Supervision - Intermittent    Equipment Recommendations  None recommended by PT    Recommendations for Other Services       Precautions / Restrictions Precautions Precautions: Fall Restrictions Weight Bearing Restrictions: No      Mobility  Bed Mobility Overal bed mobility: Needs Assistance Bed Mobility: Supine to Sit;Sit to Supine     Supine to sit: Supervision Sit to supine: Supervision   General bed mobility comments: for safety, increased time and HOB slightly elevated   Transfers Overall transfer level: Needs assistance Equipment used: None Transfers: Sit to/from Stand Sit to Stand: Min guard         General transfer comment: close guard for safety.   Ambulation/Gait Ambulation/Gait assistance: Min guard Ambulation Distance (Feet): 150 Feet Assistive device: IV Pole Gait Pattern/deviations:  Step-through pattern;Decreased stride length;Drifts right/left     General Gait Details: fair gait speed. close guard for safety. dyspnea 2/4. Pt tolerated distance fairly well.   Stairs            Wheelchair Mobility    Modified Rankin (Stroke Patients Only)       Balance Overall balance assessment: Mild deficits observed, not formally tested                                           Pertinent Vitals/Pain Pain Assessment: No/denies pain    Home Living Family/patient expects to be discharged to:: Private residence Living Arrangements: Alone Available Help at Discharge: Family;Friend(s);Available PRN/intermittently Type of Home: House Home Access: Stairs to enter Entrance Stairs-Rails: Right Entrance Stairs-Number of Steps: 4 Home Layout: One level;Laundry or work area in [employer]basement Home Equipment: [employer]ane - single point;Grab bars - tub/shower      Prior Function Level of Independence: Independent               Hand Dominance   Dominant Hand: Right    Extremity/Trunk Assessment   Upper Extremity Assessment Upper Extremity Assessment: Defer to OT evaluation    Lower Extremity Assessment Lower Extremity Assessment: Generalized weakness    Cervical / Trunk Assessment Cervical / Trunk Assessment: Normal  Communication   Communication: No difficulties  Cognition Arousal/Alertness: Awake/alert Behavior During Therapy: WFL for tasks assessed/performed Overall Cognitive Status: Within Functional Limits for tasks assessed  General Comments      Exercises     Assessment/Plan    PT Assessment Patient needs continued PT services  PT Problem List Decreased mobility;Decreased activity tolerance;Decreased strength       PT Treatment Interventions Gait training;Functional mobility training;Therapeutic activities;Balance training;Patient/family education;Therapeutic exercise    PT  Goals (Current goals can be found in the Care Plan section)  Acute Rehab PT Goals Patient Stated Goal: return home to his dog  PT Goal Formulation: With patient Time For Goal Achievement: 08/11/17 Potential to Achieve Goals: Good    Frequency Min 3X/week   Barriers to discharge        Co-evaluation               AM-PAC PT "6 Clicks" Daily Activity  Outcome Measure Difficulty turning over in bed (including adjusting bedclothes, sheets and blankets)?: None Difficulty moving from lying on back to sitting on the side of the bed? : A Little Difficulty sitting down on and standing up from a chair with arms (e.g., wheelchair, bedside commode, etc,.)?: A Little Help needed moving to and from a bed to chair (including a wheelchair)?: A Little Help needed walking in hospital room?: A Little Help needed climbing 3-5 steps with a railing? : A Little 6 Click Score: 19    End of Session Equipment Utilized During Treatment: Gait belt Activity Tolerance: Patient tolerated treatment well Patient left: in bed;with call bell/phone within reach;with bed alarm set;with family/visitor present   PT Visit Diagnosis: Difficulty in walking, not elsewhere classified (R26.2);Muscle weakness (generalized) (M62.81)    Time: 1350-1403 PT Time Calculation (min) (ACUTE ONLY): 13 min   Charges:   PT Evaluation $PT Eval Moderate Complexity: 1 Mod     PT G Codes:          Rebeca Alert, MPT Pager: [phone]

## 2017-07-28 NOTE — Progress Notes (Signed)
PROGRESS NOTE    James [name]  ZOX:[id number] DOB: 1946/01/18 DOA: 07/24/2017 PCP: Joycelyn Rua, MD   Brief Narrative:  James [name] is a 72 y.o. male with medical history significant for coronary artery disease, chronic systolic CHF, hypertension, and recent lower extremity cellulitis treated with antibiotics and complicated by diarrhea which has since resolved, now presenting to the emergency department for evaluation of fevers and lower abdominal discomfort. The patient has been experiencing fatigue recently, prompting outpatient stress test that was found to be abnormal, and he presented to the hospital today for coronary catheterization.  He was found to be febrile, was complaining of lower abdominal pain, labs were collected and he was noted to have a marked leukocytosis so catheterization was canceled, and he was directed to the ED.  Patient denied melena or hematochezia, reports that he was recently experiencing diarrhea, but that has essentially resolved and he is now having approximately 1 loose stool daily.  He denies chest pain, palpitations, shortness breath, or cough.  Reports that his chronic bilateral lower extremity edema is better than usual.  Upon arrival to the ED,  Chest x-ray was notable for stable cardiomegaly and mild interstitial prominence concerning for mild CHF or atypical pneumonia.  CT of the abdomen and pelvis reveals sigmoid diverticulitis without abscess or perforation.  Also noted on the CT is small left pleural effusion with marked pleural thickening concerning for possible infection versus neoplasm. Patient was admitted for ongoing evaluation and management of acute sigmoid diverticulitis and subsequently found to have a Swollen and Erythematous Left Leg. Continued to be febrile intermittently and was Nauseous and vomiting but is now improving. Loose bowel movements are slowing down. Patient improving but not eating much so encouraged to eat to advance diet.    Assessment & Plan:   Principal Problem:   Diverticulitis of sigmoid colon Active Problems:   Hypertension   Coronary artery disease   Chronic systolic CHF (congestive heart failure) (HCC)   CKD (chronic kidney disease), stage III (HCC)   Pleural thickening  Acute Sigmoid Diverticulitis  -Presented with lower abdominal pain and fever, found to have leukocytosis to 19k with normal lactate  -CT abd/pelvis reveals sigmoid diverticulitis without abscess or perforation  -Blood cultures collected in ED, 1 liter NS given, and empiric Zosyn started -Blood cultures x2 Show NGTD at 4 Days  -Patient spiking intermittent temperatures -S/p Total of 4 Liters of boluses and on IV Maintenance but reduce rate from 75 mL/hr to 50 mL/hr and will Stop today  -WBC went from 19.5 -> 17.5 -> 24.9 -> 21.2 -> 17.9 -Check GI Pathogen Panel Negative so will D/C Enteric Precautions -Continue Zosyn for now, follow cultures and clinical course, continue symptomatic care with prn antiemetic and analgesia, advance diet as tolerated   -Continue clear liquid diet or now if patient can tolerate it will increase to FULL Liquid Diet -LA was 1.8 and improved to 1.1 -Procalcitonin Level was 0.72 -> 0.63 -Continue to Monitor and Repeat CBC in AM  Left Leg Swelling and Erythema with Concern for Cellulitis, improving -Left Leg with Swelling, Warmth, and Erythema and is bigger than Right Leg -C/w IV Zosyn as above -Have asked Nurse to mark area and it looks improved  -Obtain LE Venous Duplex to r/o DVT and showed no DVT -Improved slightly -Elevate Legs  CAD status post CABG -No Anginal complaints currently  -Had been experiencing fatigue recently, prompting outpatient stress test which was abnormal and he was planned for cath  on day of admit, cancelled d/t fever and marked Leukocytosis -Myocardial Perfusion Imaging showed Abnormal stress test with anteroapical ischemia and EF now reduced by echo and stress test -Of  note, he reports resolution of the fatigue after losing 20 lbs recently d/t diarrhea -CT scan showed calcified aortic and coronary atherosclerosis -Continue Daily ASA 81   -Will need Cardiac Catheterization once Infection is improved and close Cardiology Follow up   Chronic Systolic CHF  -Appears well-compensated and was on the dry side but seems more euvolemic today ,  -Weight down significantly after recent diarrhea per patient   -Strict I's/O's and Daily Weights -Continue to Monitor Volume Status Carefully as patient was started on IVF Hydration with NS and given boluses (4 Liters); Rate reduced to 50 mL/hr -Patient is +7.738 Liters and Weight is up 10 lbs  CKD stage III, stable -SCr is 1.34 on admission, consistent with his apparent baseline  -Renally-dose medications, avoid nephrotoxins -Started gentle IVF Hydration with NS at 75 mL/hr and reduced rate to 50 mL/hr and will stop later today  -BUN/Cr went from 12/1.33 -> 13/1.48 -> 14/1.37 -> 14/1.25 -Continue to Monitor and Repeat CMP in AM  Hypokalemia -Serum potassium was 3.2 and improved to 3.6 -Replete today with 40 mEq of potassium chloride x1 -Continue to monitor and replete as necessary -Repeat CMP in a.m.  Hypophosphatemia -Patient's Phos Level was 2.2 this AM -Replete with K-Phos neutral 500 mg p.o. twice daily -Continue to monitor and replete as necessary -Repeat phosphorus level in a.m.  Left pleural effusion/pleural thickening  -Incidentally noted on CT and noted to be a small left pleural effusion just a convex border with surrounding pleura that is thickened and there are septations within this collection -Concerning for infection such as empyema or a pleural-based neoplasm  -No s/s of pulmonary infection  -Recommend close outpatient follow up and tissue sampling to be performed for further evaluation as an outpatient -Repeat CXR in AM   Normocytic Anemia -Patient's hemoglobin/hematocrit went from  13.2/30.0 and is now 12.2/36.5 -Likely dilutional drop and in the setting of CKD -We will check anemia panel in the a.m. -Continue to monitor for signs and symptoms of bleeding -Repeat CBC in AM.  Hyperglycemia in the setting of Previously Diagnosed Diabetes Mellitus -Checked  HbA1c and was 8.4; Last HbA1c was 7.8 several years ago -CMP this AM showed blood sugar of 122 -Continue to Monitor CBG's -Will add Sensitive Novolog SSI AC -CBG ranging from 105-138  DVT prophylaxis: Enoxaparin 40 mg sq q24h Code Status: FULL CODE Family Communication: No family present at bedside  Disposition Plan: Remain Inpatient for current workup and treatment and Advanced Diet to Full Liquid; Advance to Soft and possible D/C Home with Home with Home Health PT in next 24-48 hours on Oral Abx if medically stable   Consultants:   None   Procedures:  LE VENOUS DUPLEX There is no DVT or SVT noted in the bilateral lower extremities.    Antimicrobials:  Anti-infectives (From admission, onward)   Start     Dose/Rate Route Frequency Ordered Stop   07/24/17 1800  piperacillin-tazobactam (ZOSYN) IVPB 3.375 g     3.375 g 12.5 mL/hr over 240 Minutes Intravenous Every 8 hours 07/24/17 1055     07/24/17 1100  piperacillin-tazobactam (ZOSYN) IVPB 3.375 g  Status:  Discontinued     3.375 g 100 mL/hr over 30 Minutes Intravenous  Once 07/24/17 1050 07/24/17 1056   07/24/17 1100  piperacillin-tazobactam (ZOSYN) IVPB 3.375 g  3.375 g 100 mL/hr over 30 Minutes Intravenous  Once 07/24/17 1056 07/24/17 1134     Subjective: Seen and examined and better today.  Sitting in chair bedside with no acute complaints.  States he has had loose bowel movements and he slowing down.  No abdominal pain at this time.  No nausea or vomiting.  Patient still not eating very much and encouraged to eat.  No chest pain, lightheadedness, or dizziness at this time.  Objective: Vitals:   07/27/17 2019 07/28/17 0523 07/28/17 0648  07/28/17 1447  BP: (!) 146/89  (!) 172/94 (!) 175/81  Pulse: 98  81 69  Resp: 20  20 16   Temp: 98.8 F (37.1 C)  98.8 F (37.1 C) 98.5 F (36.9 C)  TempSrc: Oral  Oral Oral  SpO2: 97%  96% 97%  Weight:  117.1 kg (258 lb 3.2 oz)    Height:        Intake/Output Summary (Last 24 hours) at 07/28/2017 1643 Last data filed at 07/28/2017 1500 Gross per 24 hour  Intake 2014.58 ml  Output -  Net 2014.58 ml   Filed Weights   07/26/17 0345 07/27/17 0420 07/28/17 0523  Weight: 115.7 kg (255 lb 1.2 oz) 117.5 kg (259 lb 0.7 oz) 117.1 kg (258 lb 3.2 oz)   Examination: Physical Exam:  Constitutional: Well-nourished, well-developed obese Caucasian who is sitting in chair comfortable and improved  Eyes: Sclera anicteric.  Conjunctive and lids normal. ENMT: External ears and nose appear normal.  Grossly normal hearing.  Mucous members are moist Neck: Supple with no JVD Respiratory: Diminished to auscultation bilaterally.  No appreciable wheezing, rales, rhonchi.  Patient is not tachypneic using accessory muscle breathe Cardiovascular: Regular rate and rhythm.  No appreciable murmurs, rubs, gallops.  Has bilateral lower 1+ extremity edema left leg still slightly better than the right Abdomen: Soft, not tender to palpate today.  Distended secondary body habitus.  Bowel sounds present all 4 quadrants and slightly hyperactive GU: Deferred Musculoskeletal: No contractures or cyanosis.  No joint deformities noted Skin: Skin is warm and dry.  Left leg extremity is not as warm and erythema has improved significantly.  Leg is still swollen slightly but is improved from admission. Neurologic: Cranial nerves II through XII grossly intact no appreciable focal deficits. Psychiatric: Normal mood and affect.  Intact judgment and insight.  Is awake, alert, and oriented x3  Data Reviewed: I have personally reviewed following labs and imaging studies  CBC: Recent Labs  Lab 07/24/17 1018 07/25/17 0358  07/26/17 0348 07/27/17 0415 07/28/17 0352  WBC 19.5* 17.5* 24.9* 21.2* 17.9*  NEUTROABS 16.4* 14.8* 21.9* 18.3* 14.3*  HGB 13.2 11.7* 12.6* 11.7* 12.2*  HCT 38.0* 35.6* 37.3* 35.1* 36.5*  MCV 82.8 85.2 85.4 85.0 84.1  PLT 267 252 267 275 329   Basic Metabolic Panel: Recent Labs  Lab 07/24/17 1018 07/25/17 0358 07/26/17 0348 07/27/17 0415 07/28/17 0352  NA 137 136 137 136 137  K 3.2* 3.1* 3.5 3.0* 3.6  CL 103 103 104 105 107  CO2 24 24 24  21* 21*  GLUCOSE 150* 137* 162* 122* 127*  BUN 12 12 13 14 14   CREATININE 1.34* 1.33* 1.48* 1.37* 1.25*  CALCIUM 8.0* 7.9* 7.9* 7.6* 7.8*  MG  --  1.7 1.7 1.7 2.0  PHOS  --   --  2.4* 2.3* 2.2*   GFR: Estimated Creatinine Clearance: 67.5 mL/min (A) (by C-G formula based on SCr of 1.25 mg/dL (H)). Liver Function Tests: Recent Labs  Lab 07/24/17 1018 07/26/17 0348 07/27/17 0415 07/28/17 0352  AST 22 17 16 31   ALT 18 19 16* 23  ALKPHOS 89 95 115 89  BILITOT 0.9 1.2 0.6 0.6  PROT 7.5 6.7 5.8* 6.0*  ALBUMIN 3.2* 2.7* 2.1* 2.1*   No results for input(s): LIPASE, AMYLASE in the last 168 hours. No results for input(s): AMMONIA in the last 168 hours. Coagulation Profile: Recent Labs  Lab 07/24/17 1018  INR 1.18   Cardiac Enzymes: No results for input(s): CKTOTAL, CKMB, CKMBINDEX, TROPONINI in the last 168 hours. BNP (last 3 results) Recent Labs    06/18/17 1001  PROBNP 274   HbA1C: No results for input(s): HGBA1C in the last 72 hours. CBG: Recent Labs  Lab 07/27/17 1122 07/27/17 1706 07/28/17 0007 07/28/17 0717 07/28/17 1142  GLUCAP 116* 145* 105* 138* 116*   Lipid Profile: No results for input(s): CHOL, HDL, LDLCALC, TRIG, CHOLHDL, LDLDIRECT in the last 72 hours. Thyroid Function Tests: No results for input(s): TSH, T4TOTAL, FREET4, T3FREE, THYROIDAB in the last 72 hours. Anemia Panel: No results for input(s): VITAMINB12, FOLATE, FERRITIN, TIBC, IRON, RETICCTPCT in the last 72 hours. Sepsis Labs: Recent Labs    Lab 07/24/17 1024 07/26/17 1110 07/26/17 1423 07/27/17 0415 07/28/17 0352  PROCALCITON  --  0.72  --  0.72 0.63  LATICACIDVEN 1.71 1.8 1.1  --   --     Recent Results (from the past 240 hour(s))  Culture, blood (Routine x 2)     Status: None (Preliminary result)   Collection Time: 07/24/17 10:18 AM  Result Value Ref Range Status   Specimen Description   Final    BLOOD RIGHT ARM Performed at [facility], [address] [place], [place] [address]    Special Requests   Final    BOTTLES DRAWN AEROBIC AND ANAEROBIC Blood Culture adequate volume Performed at [facility], [address] [place], [place] [address]    Culture   Final    NO GROWTH 4 DAYS Performed at [facility], [address], [place], [place] [address]    Report Status PENDING  Incomplete  Culture, blood (Routine x 2)     Status: None (Preliminary result)   Collection Time: 07/24/17 11:00 AM  Result Value Ref Range Status   Specimen Description   Final    BLOOD RIGHT FOREARM Performed at [facility], [address] [place], [place] [address]    Special Requests   Final    BOTTLES DRAWN AEROBIC AND ANAEROBIC Blood Culture adequate volume Performed at [facility], [address] [place], [place] [address]    Culture   Final    NO GROWTH 4 DAYS Performed at [facility], [address], [place], [place] [address]    Report Status PENDING  Incomplete  Gastrointestinal Panel by PCR , Stool     Status: None   Collection Time: 07/26/17 12:30 PM  Result Value Ref Range Status   Campylobacter species NOT DETECTED NOT DETECTED Final   Plesimonas shigelloides NOT DETECTED NOT DETECTED Final   Salmonella species NOT DETECTED NOT DETECTED Final   Yersinia enterocolitica NOT DETECTED NOT DETECTED Final   Vibrio species NOT DETECTED NOT DETECTED Final   Vibrio cholerae NOT DETECTED NOT DETECTED Final   Enteroaggregative E coli (EAEC) NOT DETECTED NOT  DETECTED Final   Enteropathogenic E coli (EPEC) NOT DETECTED NOT DETECTED Final   Enterotoxigenic  E coli (ETEC) NOT DETECTED NOT DETECTED Final   Shiga like toxin producing E coli (STEC) NOT DETECTED NOT DETECTED Final   Shigella/Enteroinvasive E coli (EIEC) NOT DETECTED NOT DETECTED Final   Cryptosporidium NOT DETECTED NOT DETECTED Final   Cyclospora cayetanensis NOT DETECTED NOT DETECTED Final   Entamoeba histolytica NOT DETECTED NOT DETECTED Final   Giardia lamblia NOT DETECTED NOT DETECTED Final   Adenovirus F40/41 NOT DETECTED NOT DETECTED Final   Astrovirus NOT DETECTED NOT DETECTED Final   Norovirus GI/GII NOT DETECTED NOT DETECTED Final   Rotavirus A NOT DETECTED NOT DETECTED Final   Sapovirus (I, II, IV, and V) NOT DETECTED NOT DETECTED Final    Comment: Performed at [facility], [address] [place], [place] [address]     Radiology Studies: No results found. Scheduled Meds: . aspirin EC  81 mg Oral QHS  . enoxaparin (LOVENOX) injection  40 mg Subcutaneous Q24H  . insulin aspart  0-9 Units Subcutaneous TID WC  . phosphorus  500 mg Oral BID  . pramipexole  1.5 mg Oral QHS  . sodium chloride flush  3 mL Intravenous Q12H  . sodium chloride flush  3 mL Intravenous Q12H   Continuous Infusions: . sodium chloride    . sodium chloride 50 mL/hr at 07/28/17 1349  . piperacillin-tazobactam (ZOSYN)  IV Stopped (07/28/17 1447)    LOS: 4 days   Merlene Laughter, DO [facility] Pager [phone]  If 7PM-7AM, please contact night-coverage [email or web address] Password [facility] 07/28/2017, 4:43 PM

## 2017-07-28 NOTE — Consult Note (Addendum)
   [facility]   07/28/2017  [name] W [facility] Center[facility] April 03, 1945 [id number]   Patient screened for potential [facility] services due to hospital readmission.   Spoke with Mr. Precious GildingBoose at bedside to discuss [facility]. Mr. Precious GildingBoose is agreeable and [facility] written consent obtained and folder provided.   Mr. Precious GildingBoose endorses that he lives in [place].  Confirmed Primary Care MD is Dr. Lenise ArenaMeyers [facility]( [facility] listed as doing transition of care call post discharge). Best contact number for Mr. Precious GildingBoose is [phone]. Mr. Precious GildingBoose agreeable for [facility] Coach follow up for likely DM management. Noted HgbA1C is 8.4. Mr. Precious GildingBoose also indicates he will have to come back to the hospital at later time for a cardiac cath. Mr. Precious GildingBoose lives alone. Denies concerns with medications or transportation.   Will make referral for [facility] RN Health Coach for DM or CHF.    Raiford NobleAtika Royale Swamy, MSN-Ed, RN,BSN [facility] Care [facility] Hospital Liaison [phone]

## 2017-07-28 NOTE — Evaluation (Signed)
Occupational Therapy Evaluation Patient Details Name: James [name] MRN: [id number] DOB: [birth year] Today's Date: 07/28/2017    History of Present Illness 72 y.o. male with medical history significant for coronary artery disease, chronic systolic CHF, hypertension, and recent lower extremity cellulitis treated with antibiotics and complicated by diarrhea which has since resolved, now presenting to the emergency department for evaluation of fevers and lower abdominal discomfort; CT of the abdomen and pelvis reveals sigmoid diverticulitis without abscess or perforation.  Also noted on the CT is small left pleural effusion with marked pleural thickening concerning for possible infection versus neoplasm.   Clinical Impression   This 72 y/o male presents with the above. Pt lives alone, at baseline is independent with ADLs and functional mobility, was driving and completing some iADLs. Pt completing room level functional mobility and standing grooming ADLs without AD this session with overall minguard assist; demonstrates LB ADLs with minguard. Pt presenting with some decreased activity tolerance, with DOE 2/4 with room level activity. Will continue to follow while pt remains in acute setting to maximize his overall safety and independence with ADLs and mobility prior to return home. Do not anticipate pt will require follow up OT services.     Follow Up Recommendations  No OT follow up;Supervision - Intermittent    Equipment Recommendations  None recommended by OT           Precautions / Restrictions Precautions Precautions: Fall Restrictions Weight Bearing Restrictions: No      Mobility Bed Mobility Overal bed mobility: Needs Assistance Bed Mobility: Supine to Sit     Supine to sit: Supervision     General bed mobility comments: for safety, increased time and HOB slightly elevated   Transfers Overall transfer level: Needs assistance Equipment used: None Transfers: Sit to/from  Stand Sit to Stand: Min guard         General transfer comment: for safety, no physical assist needed     Balance Overall balance assessment: Mild deficits observed, not formally tested                                         ADL either performed or assessed with clinical judgement   ADL Overall ADL's : Needs assistance/impaired Eating/Feeding: Modified independent;Sitting   Grooming: Wash/dry hands;Min guard;Standing   Upper Body Bathing: Supervision/ safety;Sitting   Lower Body Bathing: Min guard;Sit to/from stand   Upper Body Dressing : Set up;Sitting   Lower Body Dressing: Min guard;Sit to/from stand   Toilet Transfer: Min guard;Ambulation;Regular Teacher, adult education Details (indicate cue type and reason): simulated in transfer EOB to recliner  Toileting- Clothing Manipulation and Hygiene: Min guard;Sit to/from stand       Functional mobility during ADLs: Min guard General ADL Comments: pt completing room level ambulation, standing to wash hands at sink prior to transfer to recliner                          Pertinent Vitals/Pain Pain Assessment: No/denies pain     Hand Dominance Right   Extremity/Trunk Assessment Upper Extremity Assessment Upper Extremity Assessment: Overall WFL for tasks assessed   Lower Extremity Assessment Lower Extremity Assessment: Overall WFL for tasks assessed       Communication Communication Communication: No difficulties   Cognition Arousal/Alertness: Awake/alert Behavior During Therapy: WFL for tasks assessed/performed Overall Cognitive Status: Within Functional Limits  for tasks assessed                                                      Home Living Family/patient expects to be discharged to:: Private residence Living Arrangements: Alone Available Help at Discharge: Family;Friend(s);Available PRN/intermittently Type of Home: House Home Access: Stairs to  enter Entergy CorporationEntrance Stairs-Number of Steps: 4 Entrance Stairs-Rails: Right Home Layout: One level;Laundry or work area in basement("man cave" in basement, hasn't used it much in the past 3/4 months )     Bathroom Shower/Tub: Producer, television/film/videoWalk-in shower   Bathroom Toilet: Handicapped height     Home Equipment: Medical laboratory scientific officerCane - single point;Grab bars - tub/shower          Prior Functioning/Environment Level of Independence: Independent        Comments: driving, completing some iADLs         OT Problem List: Decreased activity tolerance;Obesity      OT Treatment/Interventions: Self-care/ADL training;DME and/or AE instruction;Therapeutic activities;Balance training;Therapeutic exercise;Patient/family education    OT Goals(Current goals can be found in the care plan section) Acute Rehab OT Goals Patient Stated Goal: return home to his dog  OT Goal Formulation: With patient Time For Goal Achievement: 08/11/17 Potential to Achieve Goals: Good  OT Frequency: Min 2X/week    AM-PAC PT "6 Clicks" Daily Activity     Outcome Measure Help from another person eating meals?: None Help from another person taking care of personal grooming?: None Help from another person toileting, which includes using toliet, bedpan, or urinal?: A Little Help from another person bathing (including washing, rinsing, drying)?: A Little Help from another person to put on and taking off regular upper body clothing?: None Help from another person to put on and taking off regular lower body clothing?: A Little 6 Click Score: 21   End of Session Equipment Utilized During Treatment: Gait belt Nurse Communication: Mobility status  Activity Tolerance: Patient tolerated treatment well Patient left: in chair;with call bell/phone within reach;with chair alarm set  OT Visit Diagnosis: Muscle weakness (generalized) (M62.81)                Time: [id number] OT Time Calculation (min): 22 min Charges:  OT General Charges $OT Visit: 1  Visit OT Evaluation $OT Eval Moderate Complexity: 1 Mod G-Codes:     Marcy SirenBreanna Blaike Newburn, OT Pager [phone] 07/28/2017   Orlando PennerBreanna L Zadin Lange 07/28/2017, 11:43 AM

## 2017-07-29 ENCOUNTER — Inpatient Hospital Stay (HOSPITAL_COMMUNITY): Payer: Medicare HMO

## 2017-07-29 DIAGNOSIS — K5732 Diverticulitis of large intestine without perforation or abscess without bleeding: Principal | ICD-10-CM

## 2017-07-29 DIAGNOSIS — J929 Pleural plaque without asbestos: Secondary | ICD-10-CM

## 2017-07-29 DIAGNOSIS — I5022 Chronic systolic (congestive) heart failure: Secondary | ICD-10-CM

## 2017-07-29 DIAGNOSIS — I10 Essential (primary) hypertension: Secondary | ICD-10-CM

## 2017-07-29 DIAGNOSIS — N183 Chronic kidney disease, stage 3 (moderate): Secondary | ICD-10-CM

## 2017-07-29 DIAGNOSIS — I2510 Atherosclerotic heart disease of native coronary artery without angina pectoris: Secondary | ICD-10-CM

## 2017-07-29 LAB — CBC WITH DIFFERENTIAL/PLATELET
Basophils Absolute: 0.1 10*3/uL (ref 0.0–0.1)
Basophils Relative: 0 %
EOS ABS: 1 10*3/uL — AB (ref 0.0–0.7)
Eosinophils Relative: 8 %
HCT: 35.6 % — ABNORMAL LOW (ref 39.0–52.0)
Hemoglobin: 11.9 g/dL — ABNORMAL LOW (ref 13.0–17.0)
LYMPHS ABS: 1.1 10*3/uL (ref 0.7–4.0)
Lymphocytes Relative: 8 %
MCH: 28.1 pg (ref 26.0–34.0)
MCHC: 33.4 g/dL (ref 30.0–36.0)
MCV: 84 fL (ref 78.0–100.0)
MONO ABS: 1.1 10*3/uL — AB (ref 0.1–1.0)
Monocytes Relative: 9 %
NEUTROS PCT: 75 %
Neutro Abs: 9.7 10*3/uL — ABNORMAL HIGH (ref 1.7–7.7)
PLATELETS: 340 10*3/uL (ref 150–400)
RBC: 4.24 MIL/uL (ref 4.22–5.81)
RDW: 14.1 % (ref 11.5–15.5)
WBC: 13 10*3/uL — ABNORMAL HIGH (ref 4.0–10.5)

## 2017-07-29 LAB — COMPREHENSIVE METABOLIC PANEL
ALBUMIN: 2.2 g/dL — AB (ref 3.5–5.0)
ALT: 47 U/L (ref 17–63)
AST: 61 U/L — AB (ref 15–41)
Alkaline Phosphatase: 86 U/L (ref 38–126)
Anion gap: 7 (ref 5–15)
BUN: 11 mg/dL (ref 6–20)
CHLORIDE: 106 mmol/L (ref 101–111)
CO2: 24 mmol/L (ref 22–32)
CREATININE: 1.12 mg/dL (ref 0.61–1.24)
Calcium: 7.7 mg/dL — ABNORMAL LOW (ref 8.9–10.3)
GFR calc Af Amer: >60 mL/min (ref >60)
GLUCOSE: 142 mg/dL — AB (ref 65–99)
POTASSIUM: 3 mmol/L — AB (ref 3.5–5.1)
Sodium: 137 mmol/L (ref 135–145)
Total Bilirubin: 0.6 mg/dL (ref 0.3–1.2)
Total Protein: 5.9 g/dL — ABNORMAL LOW (ref 6.5–8.1)

## 2017-07-29 LAB — RETICULOCYTES
RBC.: 4.24 MIL/uL (ref 4.22–5.81)
RETIC CT PCT: 0.9 % (ref 0.4–3.1)
Retic Count, Absolute: 38.2 10*3/uL (ref 19.0–186.0)

## 2017-07-29 LAB — FERRITIN: Ferritin: 976 ng/mL — ABNORMAL HIGH (ref 24–336)

## 2017-07-29 LAB — MAGNESIUM: Magnesium: 1.8 mg/dL (ref 1.7–2.4)

## 2017-07-29 LAB — PHOSPHORUS: Phosphorus: 2.3 mg/dL — ABNORMAL LOW (ref 2.5–4.6)

## 2017-07-29 LAB — GLUCOSE, CAPILLARY
GLUCOSE-CAPILLARY: 157 mg/dL — AB (ref 65–99)
Glucose-Capillary: 130 mg/dL — ABNORMAL HIGH (ref 65–99)
Glucose-Capillary: 184 mg/dL — ABNORMAL HIGH (ref 65–99)

## 2017-07-29 LAB — IRON AND TIBC
Iron: 25 ug/dL — ABNORMAL LOW (ref 45–182)
Saturation Ratios: 18 % (ref 17.9–39.5)
TIBC: 137 ug/dL — AB (ref 250–450)
UIBC: 112 ug/dL

## 2017-07-29 LAB — CULTURE, BLOOD (ROUTINE X 2)
CULTURE: NO GROWTH
Culture: NO GROWTH
SPECIAL REQUESTS: ADEQUATE
Special Requests: ADEQUATE

## 2017-07-29 LAB — VITAMIN B12: VITAMIN B 12: 668 pg/mL (ref 180–914)

## 2017-07-29 LAB — FOLATE: Folate: 11.2 ng/mL (ref >5.9)

## 2017-07-29 MED ORDER — MAGNESIUM SULFATE 2 GM/50ML IV SOLN
2.0000 g | Freq: Once | INTRAVENOUS | Status: AC
  Administered 2017-07-29: 2 g via INTRAVENOUS
  Filled 2017-07-29: qty 50

## 2017-07-29 MED ORDER — POLYSACCHARIDE IRON COMPLEX 150 MG PO CAPS
150.0000 mg | ORAL_CAPSULE | Freq: Every day | ORAL | Status: DC
  Administered 2017-07-29 – 2017-07-30 (×2): 150 mg via ORAL
  Filled 2017-07-29 (×2): qty 1

## 2017-07-29 MED ORDER — POTASSIUM CHLORIDE CRYS ER 20 MEQ PO TBCR
40.0000 meq | EXTENDED_RELEASE_TABLET | Freq: Once | ORAL | Status: AC
  Administered 2017-07-29: 40 meq via ORAL
  Filled 2017-07-29: qty 2

## 2017-07-29 MED ORDER — AMOXICILLIN-POT CLAVULANATE 875-125 MG PO TABS
1.0000 | ORAL_TABLET | Freq: Two times a day (BID) | ORAL | Status: DC
  Administered 2017-07-29 – 2017-07-30 (×2): 1 via ORAL
  Filled 2017-07-29 (×2): qty 1

## 2017-07-29 NOTE — Progress Notes (Signed)
PROGRESS NOTE [facility]   DEV DHONDT   ZOX:[id number] DOB: [birth year]  DOA: 07/24/2017 PCP: Joycelyn Rua, MD   Brief Narrative:  Loma Sender 72 year old male with medical history significant for CAD, chronic systolic CHF, hypertension and recent lower extremity  cellulitis for which completed course of antibiotic.  Patient presented to the emergency department complaining of lower abdominal pain and fever associated with loose stools.  Upon ED evaluation chest x-ray concerning for mild CHF, CT of the abdomen and pelvis revealed sigmoid diverticulitis with no abscess or perforation, elevated WBC.  Patient admitted with working diagnosis of acute sigmoid diverticulitis  Subjective: Patient seen and examined, he is tolerating full liquid diet well, denies abdominal pain, nausea, vomiting and diarrhea.  No acute events overnight.  Remains afebrile.  Assessment & Plan: Acute sigmoid diverticulitis CT of the abdomen and pelvis revealed diverticulitis without abscess or perforation Patient was started on empiric antibiotic with Zosyn, will de-escalate to Augmentin. Advance diet as tolerated.  WBC trending down.  Blood cultures no growth so far.  Left leg swelling with erythema suspect cellulitis Treated with Zosyn Lower extremity venous Doppler negative for DVT Improving, elevate legs.  CAD status post CABG/chronic systolic CHF Asymptomatic, patient is plan for cardiac cath as an outpatient. Continue ASA 81 mg Strict I&O's and daily weights No signs of fluid overload  Left pleural effusion/pleural thickening  Incidentally noted on CT and noted to be a small left pleural effusion just a convex border with surrounding pleura that is thickened and there are septations within this collection No s/s of pulmonary infection  Recommend close outpatient follow up and tissue sampling to be performed for further evaluation as an outpatient  Normocytic Anemia, iron  deficiency Hemoglobin stable, delusional drop in setting of CKD Will start iron supplement on discharge Hemoglobin stable, no signs of overt bleeding Continue to monitor  Diabetes type 2 uncontrolled with hyperglycemia A1c 8.4 CBGs acceptable Continue SSI, may need metformin upon discharge.  DVT prophylaxis: Lovenox Code Status: Full code Family Communication: None at bedside Disposition Plan: Home in a.m. if continues to tolerate diet well and afebrile    Consultants:  None   Procedures:  None  Antimicrobials: Anti-infectives (From admission, onward)   Start     Dose/Rate Route Frequency Ordered Stop   07/29/17 1800  amoxicillin-clavulanate (AUGMENTIN) 875-125 MG per tablet 1 tablet     1 tablet Oral Every 12 hours 07/29/17 1725     07/24/17 1800  piperacillin-tazobactam (ZOSYN) IVPB 3.375 g  Status:  Discontinued     3.375 g 12.5 mL/hr over 240 Minutes Intravenous Every 8 hours 07/24/17 1055 07/29/17 1725   07/24/17 1100  piperacillin-tazobactam (ZOSYN) IVPB 3.375 g  Status:  Discontinued     3.375 g 100 mL/hr over 30 Minutes Intravenous  Once 07/24/17 1050 07/24/17 1056   07/24/17 1100  piperacillin-tazobactam (ZOSYN) IVPB 3.375 g     3.375 g 100 mL/hr over 30 Minutes Intravenous  Once 07/24/17 1056 07/24/17 1134       Objective: Vitals:   07/28/17 1758 07/28/17 2228 07/29/17 0425 07/29/17 1325  BP: (!) 146/64 (!) 157/81 (!) 160/76 (!) 143/87  Pulse: 81 79 70 81  Resp: 16 20 20 16   Temp: 98.8 F (37.1 C)  97.9 F (36.6 C) 98.2 F (36.8 C)  TempSrc: Oral Oral Oral Oral  SpO2: 98% 94% 97% 97%  Weight:   114.4 kg (252 lb 3.3 oz)   Height:  Intake/Output Summary (Last 24 hours) at 07/29/2017 1718 Last data filed at 07/29/2017 1325 Gross per 24 hour  Intake 810 ml  Output -  Net 810 ml   Filed Weights   07/27/17 0420 07/28/17 0523 07/29/17 0425  Weight: 117.5 kg (259 lb 0.7 oz) 117.1 kg (258 lb 3.2 oz) 114.4 kg (252 lb 3.3 oz)     Examination:  General exam: Appears calm and comfortable  HEENT: AC/AT, PERRLA, OP moist and clear Respiratory system: BS diminished b/l, no wheezing or crackles  Cardiovascular system: S1 & S2 heard, RRR. No JVD, murmurs, rubs or gallops Gastrointestinal system: Obese. Abdomen is nondistended, soft and nontender. No organomegaly or masses felt. Normal bowel sounds heard. Central nervous system: Alert and oriented. No focal neurological deficits. Extremities: Trace LE edema, mild left leg erythema  Skin: No rashes Psychiatry: Judgement and insight appear normal. Mood & affect appropriate.    Data Reviewed: I have personally reviewed following labs and imaging studies  CBC: Recent Labs  Lab 07/25/17 0358 07/26/17 0348 07/27/17 0415 07/28/17 0352 07/29/17 0405  WBC 17.5* 24.9* 21.2* 17.9* 13.0*  NEUTROABS 14.8* 21.9* 18.3* 14.3* 9.7*  HGB 11.7* 12.6* 11.7* 12.2* 11.9*  HCT 35.6* 37.3* 35.1* 36.5* 35.6*  MCV 85.2 85.4 85.0 84.1 84.0  PLT 252 267 275 329 340   Basic Metabolic Panel: Recent Labs  Lab 07/25/17 0358 07/26/17 0348 07/27/17 0415 07/28/17 0352 07/29/17 0405  NA 136 137 136 137 137  K 3.1* 3.5 3.0* 3.6 3.0*  CL 103 104 105 107 106  CO2 24 24 21* 21* 24  GLUCOSE 137* 162* 122* 127* 142*  BUN 12 13 14 14 11   CREATININE 1.33* 1.48* 1.37* 1.25* 1.12  CALCIUM 7.9* 7.9* 7.6* 7.8* 7.7*  MG 1.7 1.7 1.7 2.0 1.8  PHOS  --  2.4* 2.3* 2.2* 2.3*   GFR: Estimated Creatinine Clearance: 74.4 mL/min (by C-G formula based on SCr of 1.12 mg/dL). Liver Function Tests: Recent Labs  Lab 07/24/17 1018 07/26/17 0348 07/27/17 0415 07/28/17 0352 07/29/17 0405  AST 22 17 16 31  61*  ALT 18 19 16* 23 47  ALKPHOS 89 95 115 89 86  BILITOT 0.9 1.2 0.6 0.6 0.6  PROT 7.5 6.7 5.8* 6.0* 5.9*  ALBUMIN 3.2* 2.7* 2.1* 2.1* 2.2*   No results for input(s): LIPASE, AMYLASE in the last 168 hours. No results for input(s): AMMONIA in the last 168 hours. Coagulation Profile: Recent  Labs  Lab 07/24/17 1018  INR 1.18   Cardiac Enzymes: No results for input(s): CKTOTAL, CKMB, CKMBINDEX, TROPONINI in the last 168 hours. BNP (last 3 results) Recent Labs    06/18/17 1001  PROBNP 274   HbA1C: No results for input(s): HGBA1C in the last 72 hours. CBG: Recent Labs  Lab 07/28/17 1142 07/28/17 1728 07/29/17 0824 07/29/17 1132 07/29/17 1644  GLUCAP 116* 131* 130* 184* 157*   Lipid Profile: No results for input(s): CHOL, HDL, LDLCALC, TRIG, CHOLHDL, LDLDIRECT in the last 72 hours. Thyroid Function Tests: No results for input(s): TSH, T4TOTAL, FREET4, T3FREE, THYROIDAB in the last 72 hours. Anemia Panel: Recent Labs    07/29/17 0405  VITAMINB12 668  FOLATE 11.2  FERRITIN 976*  TIBC 137*  IRON 25*  RETICCTPCT 0.9   Sepsis Labs: Recent Labs  Lab 07/24/17 1024 07/26/17 1110 07/26/17 1423 07/27/17 0415 07/28/17 0352  PROCALCITON  --  0.72  --  0.72 0.63  LATICACIDVEN 1.71 1.8 1.1  --   --  Recent Results (from the past 240 hour(s))  Culture, blood (Routine x 2)     Status: None   Collection Time: 07/24/17 10:18 AM  Result Value Ref Range Status   Specimen Description   Final    BLOOD RIGHT ARM Performed at [facility], [address] [place], [place] [address]    Special Requests   Final    BOTTLES DRAWN AEROBIC AND ANAEROBIC Blood Culture adequate volume Performed at [facility], [address] [place], [place] [address]    Culture   Final    NO GROWTH 5 DAYS Performed at [facility], [address] [place], [place] [address]    Report Status 07/29/2017 FINAL  Final  Culture, blood (Routine x 2)     Status: None   Collection Time: 07/24/17 11:00 AM  Result Value Ref Range Status   Specimen Description   Final    BLOOD RIGHT FOREARM Performed at [facility], [address] [place], [place] [address]    Special Requests   Final    BOTTLES DRAWN AEROBIC AND ANAEROBIC Blood Culture  adequate volume Performed at [facility], [address] [place], [place] [address]    Culture   Final    NO GROWTH 5 DAYS Performed at [facility], [address] [place], [place] [address]    Report Status 07/29/2017 FINAL  Final  Gastrointestinal Panel by PCR , Stool     Status: None   Collection Time: 07/26/17 12:30 PM  Result Value Ref Range Status   Campylobacter species NOT DETECTED NOT DETECTED Final   Plesimonas shigelloides NOT DETECTED NOT DETECTED Final   Salmonella species NOT DETECTED NOT DETECTED Final   Yersinia enterocolitica NOT DETECTED NOT DETECTED Final   Vibrio species NOT DETECTED NOT DETECTED Final   Vibrio cholerae NOT DETECTED NOT DETECTED Final   Enteroaggregative E coli (EAEC) NOT DETECTED NOT DETECTED Final   Enteropathogenic E coli (EPEC) NOT DETECTED NOT DETECTED Final   Enterotoxigenic E coli (ETEC) NOT DETECTED NOT DETECTED Final   Shiga like toxin producing E coli (STEC) NOT DETECTED NOT DETECTED Final   Shigella/Enteroinvasive E coli (EIEC) NOT DETECTED NOT DETECTED Final   Cryptosporidium NOT DETECTED NOT DETECTED Final   Cyclospora cayetanensis NOT DETECTED NOT DETECTED Final   Entamoeba histolytica NOT DETECTED NOT DETECTED Final   Giardia lamblia NOT DETECTED NOT DETECTED Final   Adenovirus F40/41 NOT DETECTED NOT DETECTED Final   Astrovirus NOT DETECTED NOT DETECTED Final   Norovirus GI/GII NOT DETECTED NOT DETECTED Final   Rotavirus A NOT DETECTED NOT DETECTED Final   Sapovirus (I, II, IV, and V) NOT DETECTED NOT DETECTED Final    Comment: Performed at [facility], [address], [place], [place] [address]      Radiology Studies: Dg Chest Port 1 View  Result Date: 07/29/2017 CLINICAL DATA:  Shortness of breath EXAM: PORTABLE CHEST 1 VIEW COMPARISON:  July 24, 2017 FINDINGS: There is airspace consolidation in the lateral left base with small left pleural effusion. The lungs elsewhere are clear. There is  cardiomegaly with pulmonary vascularity normal. No adenopathy. Patient is status post coronary artery bypass grafting. No evident bone lesions. IMPRESSION: Airspace consolidation lateral left base consistent with pneumonia. Small left pleural effusion. Lungs elsewhere clear. Stable cardiomegaly. Electronically Signed   By: Bretta Bang III M.D.   On: 07/29/2017 07:13  Scheduled Meds: . aspirin EC  81 mg Oral QHS  . enoxaparin (LOVENOX) injection  40 mg Subcutaneous Q24H  . insulin aspart  0-9 Units Subcutaneous TID WC  . pramipexole  1.5 mg Oral QHS  . sodium chloride flush  3 mL Intravenous Q12H  . sodium chloride flush  3 mL Intravenous Q12H   Continuous Infusions: . sodium chloride    . piperacillin-tazobactam (ZOSYN)  IV 3.375 g (07/29/17 1045)     LOS: 5 days    Time spent: Total of 25 minutes spent with pt, greater than 50% of which was spent in discussion of  treatment, counseling and coordination of care   Latrelle Dodrill, MD Pager: Text Page via [email or web address]   If 7PM-7AM, please contact night-coverage [email or web address] 07/29/2017, 5:18 PM   Note - This record has been created using [employer]. Chart creation errors have been sought, but may not always have been located. Such creation errors do not reflect on the standard of medical care.

## 2017-07-29 NOTE — Progress Notes (Signed)
Physical Therapy Treatment Patient Details Name: James [name] MRN: [id number] DOB: Nov 05, 1945 Today's Date: 07/29/2017    History of Present Illness 72 y.o. male with medical history significant for coronary artery disease, chronic systolic CHF, hypertension, and recent lower extremity cellulitis treated with antibiotics and complicated by diarrhea which has since resolved, now presenting to the emergency department for evaluation of fevers and lower abdominal discomfort; CT of the abdomen and pelvis reveals sigmoid diverticulitis without abscess or perforation.  Also noted on the CT is small left pleural effusion with marked pleural thickening concerning for possible infection versus neoplasm.    PT Comments    Progressing with mobility.    Follow Up Recommendations  Home health PT;Supervision - Intermittent     Equipment Recommendations  None recommended by PT    Recommendations for Other Services       Precautions / Restrictions Precautions Precautions: Fall Restrictions Weight Bearing Restrictions: No    Mobility  Bed Mobility               General bed mobility comments: oob in recliner  Transfers Overall transfer level: Needs assistance   Transfers: Sit to/from Stand Sit to Stand: Min guard         General transfer comment: close guard for safety.   Ambulation/Gait Ambulation/Gait assistance: Min guard Ambulation Distance (Feet): 500 Feet Assistive device: IV Pole Gait Pattern/deviations: Step-through pattern     General Gait Details: fair gait speed. close guard for safety. dyspnea 2/4. O2 sat >90% on RA.  Pt tolerated distance fairly well.    Stairs             Wheelchair Mobility    Modified Rankin (Stroke Patients Only)       Balance Overall balance assessment: Mild deficits observed, not formally tested                                          Cognition Arousal/Alertness: Awake/alert Behavior During Therapy:  WFL for tasks assessed/performed Overall Cognitive Status: Within Functional Limits for tasks assessed                                        Exercises      General Comments        Pertinent Vitals/Pain Pain Assessment: No/denies pain    Home Living                      Prior Function            PT Goals (current goals can now be found in the care plan section) Progress towards PT goals: Progressing toward goals    Frequency    Min 3X/week      PT Plan Current plan remains appropriate    Co-evaluation              AM-PAC PT "6 Clicks" Daily Activity  Outcome Measure  Difficulty turning over in bed (including adjusting bedclothes, sheets and blankets)?: None Difficulty moving from lying on back to sitting on the side of the bed? : A Little Difficulty sitting down on and standing up from a chair with arms (e.g., wheelchair, bedside commode, etc,.)?: A Little Help needed moving to and from a bed to chair (including a wheelchair)?: A Little  Help needed walking in hospital room?: A Little Help needed climbing 3-5 steps with a railing? : A Little 6 Click Score: 19    End of Session Equipment Utilized During Treatment: Gait belt Activity Tolerance: Patient tolerated treatment well Patient left: in chair;with call bell/phone within reach   PT Visit Diagnosis: Difficulty in walking, not elsewhere classified (R26.2);Muscle weakness (generalized) (M62.81)     Time: 1610-9604 PT Time Calculation (min) (ACUTE ONLY): 13 min  Charges:  $Gait Training: 8-22 mins                    G Codes:          Rebeca Alert, MPT Pager: [phone]

## 2017-07-30 LAB — CBC WITH DIFFERENTIAL/PLATELET
BASOS PCT: 0 %
Basophils Absolute: 0.1 10*3/uL (ref 0.0–0.1)
EOS ABS: 1.2 10*3/uL — AB (ref 0.0–0.7)
Eosinophils Relative: 10 %
HCT: 34.9 % — ABNORMAL LOW (ref 39.0–52.0)
HEMOGLOBIN: 11.7 g/dL — AB (ref 13.0–17.0)
LYMPHS ABS: 1 10*3/uL (ref 0.7–4.0)
Lymphocytes Relative: 9 %
MCH: 28 pg (ref 26.0–34.0)
MCHC: 33.5 g/dL (ref 30.0–36.0)
MCV: 83.5 fL (ref 78.0–100.0)
MONOS PCT: 10 %
Monocytes Absolute: 1.1 10*3/uL — ABNORMAL HIGH (ref 0.1–1.0)
NEUTROS PCT: 71 %
Neutro Abs: 7.9 10*3/uL — ABNORMAL HIGH (ref 1.7–7.7)
Platelets: 356 10*3/uL (ref 150–400)
RBC: 4.18 MIL/uL — AB (ref 4.22–5.81)
RDW: 14 % (ref 11.5–15.5)
WBC: 11.2 10*3/uL — AB (ref 4.0–10.5)

## 2017-07-30 LAB — BASIC METABOLIC PANEL
Anion gap: 6 (ref 5–15)
BUN: 9 mg/dL (ref 6–20)
CHLORIDE: 106 mmol/L (ref 101–111)
CO2: 26 mmol/L (ref 22–32)
CREATININE: 1.01 mg/dL (ref 0.61–1.24)
Calcium: 7.7 mg/dL — ABNORMAL LOW (ref 8.9–10.3)
GFR calc non Af Amer: >60 mL/min (ref >60)
Glucose, Bld: 145 mg/dL — ABNORMAL HIGH (ref 65–99)
POTASSIUM: 2.7 mmol/L — AB (ref 3.5–5.1)
SODIUM: 138 mmol/L (ref 135–145)

## 2017-07-30 LAB — GLUCOSE, CAPILLARY
GLUCOSE-CAPILLARY: 175 mg/dL — AB (ref 65–99)
GLUCOSE-CAPILLARY: 130 mg/dL — AB (ref 65–99)
GLUCOSE-CAPILLARY: 202 mg/dL — AB (ref 65–99)

## 2017-07-30 LAB — MAGNESIUM: Magnesium: 2 mg/dL (ref 1.7–2.4)

## 2017-07-30 MED ORDER — POTASSIUM CHLORIDE 10 MEQ/100ML IV SOLN
10.0000 meq | INTRAVENOUS | Status: AC
  Administered 2017-07-30 (×3): 10 meq via INTRAVENOUS
  Filled 2017-07-30: qty 100

## 2017-07-30 MED ORDER — METFORMIN HCL 1000 MG PO TABS: 1000.0000 mg | ORAL_TABLET | Freq: Two times a day (BID) | ORAL | 0 refills | Status: DC

## 2017-07-30 MED ORDER — POLYSACCHARIDE IRON COMPLEX 150 MG PO CAPS: 150.0000 mg | ORAL_CAPSULE | Freq: Every day | ORAL | 0 refills | Status: DC

## 2017-07-30 MED ORDER — POLYETHYLENE GLYCOL 3350 17 G PO PACK: 17.0000 g | PACK | Freq: Every day | ORAL | 0 refills | Status: DC

## 2017-07-30 MED ORDER — AMOXICILLIN-POT CLAVULANATE 875-125 MG PO TABS: 1.0000 | ORAL_TABLET | Freq: Two times a day (BID) | ORAL | 0 refills | Status: AC

## 2017-07-30 MED ORDER — POTASSIUM CHLORIDE ER 20 MEQ PO TBCR: 20.0000 meq | EXTENDED_RELEASE_TABLET | Freq: Every day | ORAL | 0 refills | Status: DC

## 2017-07-30 MED ORDER — POTASSIUM CHLORIDE CRYS ER 20 MEQ PO TBCR
40.0000 meq | EXTENDED_RELEASE_TABLET | ORAL | Status: DC
  Administered 2017-07-30: 40 meq via ORAL
  Filled 2017-07-30: qty 2

## 2017-07-30 MED FILL — POTASSIUM CL ER 20 MEQ TAB: 20 | 3 days supply | Qty: 3 | Fill #0

## 2017-07-30 MED FILL — FERREX 150 CAPSULE: 150 | 30 days supply | Qty: 30 | Fill #0

## 2017-07-30 MED FILL — AMOX-CLAV 875-125 MG TABLET: 875-125 | 4 days supply | Qty: 8 | Fill #0

## 2017-07-30 MED FILL — SM CLEARLAX POWDER: 30 days supply | Qty: 510 | Fill #0

## 2017-07-30 MED FILL — metFORMIN HCL 1000 MG TABS: 1000 | 30 days supply | Qty: 60 | Fill #0

## 2017-07-30 NOTE — Progress Notes (Signed)
CRITICAL VALUE ALERT  Critical Value: K 2.7  Date & Time Notied:  07/30/17 0524  Provider Notified: K.Kirby  Orders Received/Actions taken: Potassium Pill q4

## 2017-07-30 NOTE — Progress Notes (Signed)
Occupational Therapy Treatment Patient Details Name: James [name] MRN: [id number] DOB: 1945-10-30 Today's Date: 07/30/2017    History of present illness 72 y.o. male with medical history significant for coronary artery disease, chronic systolic CHF, hypertension, and recent lower extremity cellulitis treated with antibiotics and complicated by diarrhea which has since resolved, now presenting to the emergency department for evaluation of fevers and lower abdominal discomfort; CT of the abdomen and pelvis reveals sigmoid diverticulitis without abscess or perforation.  Also noted on the CT is small left pleural effusion with marked pleural thickening concerning for possible infection versus neoplasm.   OT comments  Pt states he may be d/c today. Pt up in room for ADL with supervision without difficulty. Educated on energy conservation and safety with ADL and emphasized importance of pacing self with return home and to activities. Pt verbalized understanding of all education.   Follow Up Recommendations  No OT follow up;Supervision - Intermittent    Equipment Recommendations  None recommended by OT    Recommendations for Other Services      Precautions / Restrictions Precautions Precautions: Fall Restrictions Weight Bearing Restrictions: No       Mobility Bed Mobility               General bed mobility comments: OOB in chair.   Transfers   Equipment used: None   Sit to Stand: Supervision         General transfer comment: supervison for safety.    Balance                                           ADL either performed or assessed with clinical judgement   ADL       Grooming: Wash/dry hands;Oral care;Standing;Supervision/safety                   Toilet Transfer: Supervision/safety;Comfort height toilet   Toileting- Clothing Manipulation and Hygiene: Supervision/safety;Sit to/from stand Toileting - Clothing Manipulation Details  (indicate cue type and reason): with gown standing       General ADL Comments: Discussed safety with showering at home and have daughter be present/nearby for initial showers for safety. Pt verbalized understanding. Educated on pacing self with returning to activities in order to not overdo himself. HR monitored during session. HR 68 at rest, 93 with activity, 77 with initial rest after activity. No complaint of pain or discomfort. Pt states he has a shower chair so discussed sitting down to shower initially for safety and energy conservation also.      Vision Patient Visual Report: No change from baseline     Perception     Praxis      Cognition Arousal/Alertness: Awake/alert Behavior During Therapy: WFL for tasks assessed/performed Overall Cognitive Status: Within Functional Limits for tasks assessed                                          Exercises     Shoulder Instructions       General Comments      Pertinent Vitals/ Pain       Pain Assessment: No/denies pain  Home Living  Prior Functioning/Environment              Frequency  Min 2X/week        Progress Toward Goals  OT Goals(current goals can now be found in the care plan section)  Progress towards OT goals: Progressing toward goals     Plan      Co-evaluation                 AM-PAC PT "6 Clicks" Daily Activity     Outcome Measure   Help from another person eating meals?: None Help from another person taking care of personal grooming?: A Little Help from another person toileting, which includes using toliet, bedpan, or urinal?: A Little Help from another person bathing (including washing, rinsing, drying)?: A Little Help from another person to put on and taking off regular upper body clothing?: None Help from another person to put on and taking off regular lower body clothing?: A Little 6 Click Score: 20     End of Session    OT Visit Diagnosis: Muscle weakness (generalized) (M62.81)   Activity Tolerance Patient tolerated treatment well   Patient Left in chair;with call bell/phone within reach   Nurse Communication          Time: 9604-5409 OT Time Calculation (min): 16 min  Charges: OT General Charges $OT Visit: 1 Visit OT Treatments $Self Care/Home Management : 8-22 mins     Zannie Kehr Wanya Bangura 07/30/2017, 10:48 AM

## 2017-07-30 NOTE — Discharge Summary (Signed)
Physician Discharge Summary  Pravin Perezperez [facility]  ZOX:[id number]  DOB: 01-02-1946  DOA: 07/24/2017 PCP: Joycelyn Rua, MD  Admit date: 07/24/2017 Discharge date: 07/30/2017  Admitted From: Home  Disposition: Home   Recommendations for Outpatient Follow-up:  1. Follow up with PCP in 1 week  2. Please obtain BMP/CBC in one week to monitor hemoglobin and renal function 3. Recommend lung tissue sampling to be performed for further evaluation of pleural thickening.   4. Monitor diabetes, he has been started on metformin  Home Health: PT/OT Equipment/Devices: None   Discharge Condition: Stable CODE STATUS: Full code Diet recommendation: Heart Healthy / Carb Modified - low residue  Brief/Interim Summary: For full details see H&P/Progress note, but in brief, James [name] is a 72 year old male with medical history significant for CAD, chronic systolic CHF, hypertension and recent lower extremity  cellulitis for which completed course of antibiotic.  Patient presented to the emergency department complaining of lower abdominal pain and fever associated with loose stools.  Upon ED evaluation chest x-ray concerning for mild CHF, CT of the abdomen and pelvis revealed sigmoid diverticulitis with no abscess or perforation, elevated WBC.  Patient admitted with working diagnosis of acute sigmoid diverticulitis  Subjective: Patient seen and examined, tolerating diet well.  Having formed bowel movements.  Remains afebrile.  Denies abdominal pain, nausea vomiting.  Leg swelling significantly improved.  No acute events overnight.  Discharge Diagnoses/Hospital Course:  Acute sigmoid diverticulitis CT of the abdomen and pelvis revealed diverticulitis without abscess or perforation Patient was started on empiric antibiotic with Zosyn, transitioned to Augmentin will complete total of 10 days. WBC trending down.  Remains afebrile. Blood cultures no growth so far. Advised low residue diet for now.  Follow-up with PCP  in 1 week  Left leg swelling with erythema suspect cellulitis - Improved Treated with Zosyn, will be discharged on Augmentin which will cover cellulitis as well Lower extremity venous Doppler negative for DVT Keep legs elevated.  CAD status post CABG/chronic systolic CHF Asymptomatic, patient is plan for cardiac cath as an outpatient. Continue ASA 81 mg No signs of fluid overload Follow-up with cardiology  Left pleural effusion/pleural thickening  Incidental finding on CT and noted to be a small left pleural effusion just a convex border with surrounding pleura that is thickened and there are septations within this collection No s/s of pulmonary infection  Recommend close outpatient follow up and tissue sampling to be performed for further evaluation.   Normocytic Anemia, iron deficiency Hemoglobin stable, delusional drop in setting of CKD Start Niferex 150 mg daily  Hemoglobin stable, no signs of overt bleeding Check CBC in 1 week   Diabetes type 2 uncontrolled with hyperglycemia A1c 8.4 CBGs acceptable Will start metformin 1000 mg BID, follow up with PCP   Hypokalemia  Repleted with IV KCL Will send Kdur 20 mEq x 3 days Repeat BMP in 1 week   AKI on CKD stage III Stable  Check renal fx in 1 week   All other chronic medical condition were stable during the hospitalization.  Patient was seen by physical therapy, recommending HHPT  On the day of the discharge the patient's vitals were stable, and no other acute medical condition were reported by patient. the patient was felt safe to be discharge to home.   Discharge Instructions  You were cared for by a hospitalist during your hospital stay. If you have any questions about your discharge medications or the care you received while you were in the  hospital after you are discharged, you can call the unit and asked to speak with the hospitalist on call if the hospitalist that took care of you is not available. Once you are  discharged, your primary care physician will handle any further medical issues. Please note that NO REFILLS for any discharge medications will be authorized once you are discharged, as it is imperative that you return to your primary care physician (or establish a relationship with a primary care physician if you do not have one) for your aftercare needs so that they can reassess your need for medications and monitor your lab values.  Discharge Instructions    AMB Referral to [facility] Care Management   Complete by:  As directed    Please assign to [facility] RN Health Coach for DM education or CHF education. PCP Deboraha Sprang) is listed as doing toc. Patient lives in [place]. Written consent obtained. Currently at [facility]. Please call with questions. Thanks.Raiford Noble, MSN-Ed, RN,BSN-[facility] Liaison-[phone]   Reason for consult:  Please assign to [facility] RN Health Coach   Diagnoses of:  Diabetes   Expected date of contact:  1-3 days (reserved for hospital discharges)   Call MD for:  difficulty breathing, headache or visual disturbances   Complete by:  As directed    Call MD for:  extreme fatigue   Complete by:  As directed    Call MD for:  hives   Complete by:  As directed    Call MD for:  persistant dizziness or light-headedness   Complete by:  As directed    Call MD for:  persistant nausea and vomiting   Complete by:  As directed    Call MD for:  redness, tenderness, or signs of infection (pain, swelling, redness, odor or green/yellow discharge around incision site)   Complete by:  As directed    Call MD for:  severe uncontrolled pain   Complete by:  As directed    Call MD for:  temperature >100.4   Complete by:  As directed    Diet - low sodium heart healthy   Complete by:  As directed    Increase activity slowly   Complete by:  As directed      Allergies as of 07/30/2017      Reactions   Lisinopril Cough      Medication List    STOP taking these medications    loperamide 2 MG tablet Commonly known as:  IMODIUM A-D     TAKE these medications   amoxicillin-clavulanate 875-125 MG tablet Commonly known as:  AUGMENTIN Take 1 tablet by mouth every 12 (twelve) hours for 4 days.   aspirin 81 MG tablet Take 81 mg by mouth at bedtime.   ibuprofen 200 MG tablet Commonly known as:  ADVIL,MOTRIN Take 200 mg by mouth every 6 (six) hours as needed for headache or moderate pain.   iron polysaccharides 150 MG capsule Commonly known as:  NIFEREX Take 1 capsule (150 mg total) by mouth daily.   metFORMIN 1000 MG tablet Commonly known as:  GLUCOPHAGE Take 1 tablet (1,000 mg total) by mouth 2 (two) times daily with a meal.   polyethylene glycol packet Commonly known as:  MIRALAX Take 17 g by mouth daily.   Potassium Chloride ER 20 MEQ Tbcr Take 20 mEq by mouth daily for 3 days.   pramipexole 0.75 MG tablet Commonly known as:  MIRAPEX Take 1.5 mg by mouth at bedtime.  Follow-up Information    Joycelyn Rua, MD. Schedule an appointment as soon as possible for a visit in 1 week(s).   Specialty:  Family Medicine Why:  Hospital follow-up Contact information: [address] [place] [address] [phone]          Allergies  Allergen Reactions  . Lisinopril Cough    Consultations:  None    Procedures/Studies: Ct Abdomen Pelvis W Contrast  Result Date: 07/24/2017 CLINICAL DATA:  Elevated white blood cell count. Lower abdominal pain and nausea for a month. Fever. EXAM: CT ABDOMEN AND PELVIS WITH CONTRAST TECHNIQUE: Multidetector CT imaging of the abdomen and pelvis was performed using the standard protocol following bolus administration of intravenous contrast. CONTRAST:  100 ml ISOVUE-300 IOPAMIDOL (ISOVUE-300) INJECTION 61% COMPARISON:  Chest and two views abdomen this same day. CT chest 06/18/2017. FINDINGS: Lower chest: The patient has a small left pleural effusion just a convex border. Surrounding pleura is thickened  and there are septations within this collection. There is mild bibasilar atelectasis. No right pleural effusion or pericardial effusion. Cardiomegaly is identified. Calcific coronary artery disease is noted. The patient is status post CABG. Hepatobiliary: No focal liver abnormality is seen. No gallstones, gallbladder wall thickening, or biliary dilatation. Pancreas: Unremarkable. No pancreatic ductal dilatation or surrounding inflammatory changes. Spleen: Normal in size without focal abnormality. Adrenals/Urinary Tract: Left renal cyst is noted. Tiny intermediate attenuating lesion off the lower pole of the right kidney is likely a cyst but cannot be definitively characterized. The kidneys are otherwise unremarkable. Ureters and urinary bladder appear normal. Adrenal glands are normal. Stomach/Bowel: There is sigmoid diverticulosis with some stranding about the proximal mid sigmoid colon consistent with diverticulitis. No abscess. The stomach, small bowel and appendix appear normal. Vascular/Lymphatic: Aortic atherosclerosis. No enlarged abdominal or pelvic lymph nodes. Reproductive: Prostate is unremarkable. Other: Trace amount of free pelvic fluid is identified. Small fat containing inguinal hernias noted. Musculoskeletal: No fracture or worrisome lesion. IMPRESSION: Sigmoid diverticulitis without abscess or perforation. Small left pleural effusion with marked pleural thickening and septations could be due to infectious process such as empyema but could also represent pleural based neoplasm. Tissue sampling could be performed for further evaluation. Calcific aortic and coronary atherosclerosis. The patient is status post CABG. Small fat containing bilateral inguinal hernias. Electronically Signed   By: Drusilla Kanner M.D.   On: 07/24/2017 12:59   Dg Chest Port 1 View  Result Date: 07/29/2017 CLINICAL DATA:  Shortness of breath EXAM: PORTABLE CHEST 1 VIEW COMPARISON:  July 24, 2017 FINDINGS: There is airspace  consolidation in the lateral left base with small left pleural effusion. The lungs elsewhere are clear. There is cardiomegaly with pulmonary vascularity normal. No adenopathy. Patient is status post coronary artery bypass grafting. No evident bone lesions. IMPRESSION: Airspace consolidation lateral left base consistent with pneumonia. Small left pleural effusion. Lungs elsewhere clear. Stable cardiomegaly. Electronically Signed   By: Bretta Bang III M.D.   On: 07/29/2017 07:13   Dg Chest Port 1 View  Result Date: 07/24/2017 CLINICAL DATA:  Fever today, shortness of breath and abdominal pain. EXAM: PORTABLE CHEST 1 VIEW COMPARISON:  Chest x-ray dated 12/08/2013. FINDINGS: Stable cardiomegaly. Overall cardiomediastinal silhouette appears stable. Chronic atelectasis/scarring within the LEFT perihilar lung. Mild interstitial prominence bilaterally. Small LEFT pleural effusion and/or pleural thickening. Osseous structures about the chest are unremarkable. IMPRESSION: 1. Stable cardiomegaly. 2. Mild interstitial prominence bilaterally indicating mild CHF/volume and/or atypical pneumonia. 3. Small LEFT pleural effusion versus chronic pleural  thickening. Electronically Signed   By: Bary RichardStan  Maynard M.D.   On: 07/24/2017 10:56     Discharge Exam: Vitals:   07/29/17 1325 07/30/17 0551  BP: (!) 143/87 (!) 155/85  Pulse: 81 73  Resp: 16 18  Temp: 98.2 F (36.8 C) 98.7 F (37.1 C)  SpO2: 97% 95%   Vitals:   07/29/17 0425 07/29/17 1325 07/30/17 0500 07/30/17 0551  BP: (!) 160/76 (!) 143/87  (!) 155/85  Pulse: 70 81  73  Resp: 20 16  18   Temp: 97.9 F (36.6 C) 98.2 F (36.8 C)  98.7 F (37.1 C)  TempSrc: Oral Oral  Oral  SpO2: 97% 97%  95%  Weight: 114.4 kg (252 lb 3.3 oz)  114.1 kg (251 lb 8.7 oz)   Height:        General: Pt is alert, awake, not in acute distress Cardiovascular: RRR, S1/S2 +, no rubs, no gallops Respiratory: CTA bilaterally, no wheezing, no rhonchi Abdominal: Soft, NT, ND,  bowel sounds + Extremities: Trace LE edema b/l, skin discoloration consistent with chronic venous stasis    The results of significant diagnostics from this hospitalization (including imaging, microbiology, ancillary and laboratory) are listed below for reference.     Microbiology: Recent Results (from the past 240 hour(s))  Culture, blood (Routine x 2)     Status: None   Collection Time: 07/24/17 10:18 AM  Result Value Ref Range Status   Specimen Description   Final    BLOOD RIGHT ARM Performed at [facility], [address] [place], [place] [address]    Special Requests   Final    BOTTLES DRAWN AEROBIC AND ANAEROBIC Blood Culture adequate volume Performed at [facility], [address] [place], [place] [address]    Culture   Final    NO GROWTH 5 DAYS Performed at [facility], [address] [place], [place] [address]    Report Status 07/29/2017 FINAL  Final  Culture, blood (Routine x 2)     Status: None   Collection Time: 07/24/17 11:00 AM  Result Value Ref Range Status   Specimen Description   Final    BLOOD RIGHT FOREARM Performed at [facility] [place], [address] [place], [place] [address]    Special Requests   Final    BOTTLES DRAWN AEROBIC AND ANAEROBIC Blood Culture adequate volume Performed at [facility] [place], [address] [place], [place] [address]    Culture   Final    NO GROWTH 5 DAYS Performed at [facility], [address] [place], [place] [address]    Report Status 07/29/2017 FINAL  Final  Gastrointestinal Panel by PCR , Stool     Status: None   Collection Time: 07/26/17 12:30 PM  Result Value Ref Range Status   Campylobacter species NOT DETECTED NOT DETECTED Final   Plesimonas shigelloides NOT DETECTED NOT DETECTED Final   Salmonella species NOT DETECTED NOT DETECTED Final   Yersinia enterocolitica NOT DETECTED NOT DETECTED Final   Vibrio species NOT DETECTED NOT DETECTED Final   Vibrio  cholerae NOT DETECTED NOT DETECTED Final   Enteroaggregative E coli (EAEC) NOT DETECTED NOT DETECTED Final   Enteropathogenic E coli (EPEC) NOT DETECTED NOT DETECTED Final   Enterotoxigenic E coli (ETEC) NOT DETECTED NOT DETECTED Final   Shiga like toxin producing E coli (STEC) NOT DETECTED NOT DETECTED Final   Shigella/Enteroinvasive E coli (EIEC) NOT DETECTED  NOT DETECTED Final   Cryptosporidium NOT DETECTED NOT DETECTED Final   Cyclospora cayetanensis NOT DETECTED NOT DETECTED Final   Entamoeba histolytica NOT DETECTED NOT DETECTED Final   Giardia lamblia NOT DETECTED NOT DETECTED Final   Adenovirus F40/41 NOT DETECTED NOT DETECTED Final   Astrovirus NOT DETECTED NOT DETECTED Final   Norovirus GI/GII NOT DETECTED NOT DETECTED Final   Rotavirus A NOT DETECTED NOT DETECTED Final   Sapovirus (I, II, IV, and V) NOT DETECTED NOT DETECTED Final    Comment: Performed at [facility], [address] [place], [place] [address]     Labs: BNP (last 3 results) No results for input(s): BNP in the last 8760 hours. Basic Metabolic Panel: Recent Labs  Lab 07/26/17 0348 07/27/17 0415 07/28/17 0352 07/29/17 0405 07/30/17 0405  NA 137 136 137 137 138  K 3.5 3.0* 3.6 3.0* 2.7*  CL 104 105 107 106 106  CO2 24 21* 21* 24 26  GLUCOSE 162* 122* 127* 142* 145*  BUN 13 14 14 11 9   CREATININE 1.48* 1.37* 1.25* 1.12 1.01  CALCIUM 7.9* 7.6* 7.8* 7.7* 7.7*  MG 1.7 1.7 2.0 1.8 2.0  PHOS 2.4* 2.3* 2.2* 2.3*  --    Liver Function Tests: Recent Labs  Lab 07/24/17 1018 07/26/17 0348 07/27/17 0415 07/28/17 0352 07/29/17 0405  AST 22 17 16 31  61*  ALT 18 19 16* 23 47  ALKPHOS 89 95 115 89 86  BILITOT 0.9 1.2 0.6 0.6 0.6  PROT 7.5 6.7 5.8* 6.0* 5.9*  ALBUMIN 3.2* 2.7* 2.1* 2.1* 2.2*   No results for input(s): LIPASE, AMYLASE in the last 168 hours. No results for input(s): AMMONIA in the last 168 hours. CBC: Recent Labs  Lab 07/26/17 0348 07/27/17 0415 07/28/17 0352  07/29/17 0405 07/30/17 0405  WBC 24.9* 21.2* 17.9* 13.0* 11.2*  NEUTROABS 21.9* 18.3* 14.3* 9.7* 7.9*  HGB 12.6* 11.7* 12.2* 11.9* 11.7*  HCT 37.3* 35.1* 36.5* 35.6* 34.9*  MCV 85.4 85.0 84.1 84.0 83.5  PLT 267 275 329 340 356   Cardiac Enzymes: No results for input(s): CKTOTAL, CKMB, CKMBINDEX, TROPONINI in the last 168 hours. BNP: Invalid input(s): POCBNP CBG: Recent Labs  Lab 07/29/17 1132 07/29/17 1644 07/29/17 2202 07/30/17 0734 07/30/17 1139  GLUCAP 184* 157* 175* 130* 202*   D-Dimer No results for input(s): DDIMER in the last 72 hours. Hgb A1c No results for input(s): HGBA1C in the last 72 hours. Lipid Profile No results for input(s): CHOL, HDL, LDLCALC, TRIG, CHOLHDL, LDLDIRECT in the last 72 hours. Thyroid function studies No results for input(s): TSH, T4TOTAL, T3FREE, THYROIDAB in the last 72 hours.  Invalid input(s): FREET3 Anemia work up Recent Labs    07/29/17 0405  VITAMINB12 668  FOLATE 11.2  FERRITIN 976*  TIBC 137*  IRON 25*  RETICCTPCT 0.9   Urinalysis    Component Value Date/Time   COLORURINE YELLOW 07/24/2017 1101   APPEARANCEUR CLEAR 07/24/2017 1101   LABSPEC 1.025 07/24/2017 1101   PHURINE 5.5 07/24/2017 1101   GLUCOSEU NEGATIVE 07/24/2017 1101   HGBUR SMALL (A) 07/24/2017 1101   BILIRUBINUR NEGATIVE 07/24/2017 1101   KETONESUR 15 (A) 07/24/2017 1101   PROTEINUR 100 (A) 07/24/2017 1101   UROBILINOGEN 1.0 10/24/2011 1742   NITRITE NEGATIVE 07/24/2017 1101   LEUKOCYTESUR NEGATIVE 07/24/2017 1101   Sepsis Labs Invalid input(s): PROCALCITONIN,  WBC,  LACTICIDVEN Microbiology Recent Results (from the past 240 hour(s))  Culture, blood (Routine x 2)     Status: None  Collection Time: 07/24/17 10:18 AM  Result Value Ref Range Status   Specimen Description   Final    BLOOD RIGHT ARM Performed at [facility], [address] [place], [place] [address]    Special Requests   Final    BOTTLES DRAWN AEROBIC AND ANAEROBIC  Blood Culture adequate volume Performed at [facility], [address] [place], [place] [address]    Culture   Final    NO GROWTH 5 DAYS Performed at [facility], [address], [place], [place] [address]    Report Status 07/29/2017 FINAL  Final  Culture, blood (Routine x 2)     Status: None   Collection Time: 07/24/17 11:00 AM  Result Value Ref Range Status   Specimen Description   Final    BLOOD RIGHT FOREARM Performed at [facility], [address] [place], [place] [address]    Special Requests   Final    BOTTLES DRAWN AEROBIC AND ANAEROBIC Blood Culture adequate volume Performed at [facility], [address] [place], [place] [address]    Culture   Final    NO GROWTH 5 DAYS Performed at [facility], [address], [place], [place] [address]    Report Status 07/29/2017 FINAL  Final  Gastrointestinal Panel by PCR , Stool     Status: None   Collection Time: 07/26/17 12:30 PM  Result Value Ref Range Status   Campylobacter species NOT DETECTED NOT DETECTED Final   Plesimonas shigelloides NOT DETECTED NOT DETECTED Final   Salmonella species NOT DETECTED NOT DETECTED Final   Yersinia enterocolitica NOT DETECTED NOT DETECTED Final   Vibrio species NOT DETECTED NOT DETECTED Final   Vibrio cholerae NOT DETECTED NOT DETECTED Final   Enteroaggregative E coli (EAEC) NOT DETECTED NOT DETECTED Final   Enteropathogenic E coli (EPEC) NOT DETECTED NOT DETECTED Final   Enterotoxigenic E coli (ETEC) NOT DETECTED NOT DETECTED Final   Shiga like toxin producing E coli (STEC) NOT DETECTED NOT DETECTED Final   Shigella/Enteroinvasive E coli (EIEC) NOT DETECTED NOT DETECTED Final   Cryptosporidium NOT DETECTED NOT DETECTED Final   Cyclospora cayetanensis NOT DETECTED NOT DETECTED Final   Entamoeba histolytica NOT DETECTED NOT DETECTED Final   Giardia lamblia NOT DETECTED NOT DETECTED Final   Adenovirus F40/41 NOT DETECTED NOT DETECTED Final    Astrovirus NOT DETECTED NOT DETECTED Final   Norovirus GI/GII NOT DETECTED NOT DETECTED Final   Rotavirus A NOT DETECTED NOT DETECTED Final   Sapovirus (I, II, IV, and V) NOT DETECTED NOT DETECTED Final    Comment: Performed at [facility], [address] [place], [place] [address]     Time coordinating discharge: 35 minutes  SIGNED:  Latrelle Dodrill, MD  [facility] 07/30/2017, 12:45 PM  Pager please text page via  [email or web address]  Note - This record has been created using [employer]. Chart creation errors have been sought, but may not always have been located. Such creation errors do not reflect on the standard of medical care.

## 2017-07-30 NOTE — Progress Notes (Signed)
[facility] Referral given to in rep.

## 2017-08-04 ENCOUNTER — Telehealth: Payer: Self-pay | Admitting: Cardiology

## 2017-08-04 NOTE — Telephone Encounter (Signed)
New Message:      Pt is returning a call about rescheduling a cath. I let the pt know that an appt was scheduled with Bhagat to discuss rescheduling this.

## 2017-08-06 ENCOUNTER — Ambulatory Visit: Payer: Medicare HMO | Admitting: Cardiology

## 2017-08-06 DIAGNOSIS — M79642 Pain in left hand: Secondary | ICD-10-CM | POA: Diagnosis not present

## 2017-08-06 DIAGNOSIS — E1121 Type 2 diabetes mellitus with diabetic nephropathy: Secondary | ICD-10-CM | POA: Diagnosis not present

## 2017-08-06 DIAGNOSIS — Z09 Encounter for follow-up examination after completed treatment for conditions other than malignant neoplasm: Secondary | ICD-10-CM | POA: Diagnosis not present

## 2017-08-06 DIAGNOSIS — Z794 Long term (current) use of insulin: Secondary | ICD-10-CM | POA: Diagnosis not present

## 2017-08-06 DIAGNOSIS — L03119 Cellulitis of unspecified part of limb: Secondary | ICD-10-CM | POA: Diagnosis not present

## 2017-08-06 MED FILL — ULTICARE PEN NDL 8MM 31G: 31G X 8 MM | 50 days supply | Qty: 100 | Fill #1

## 2017-08-07 DIAGNOSIS — I25119 Atherosclerotic heart disease of native coronary artery with unspecified angina pectoris: Secondary | ICD-10-CM | POA: Diagnosis not present

## 2017-08-07 DIAGNOSIS — Z794 Long term (current) use of insulin: Secondary | ICD-10-CM | POA: Diagnosis not present

## 2017-08-07 DIAGNOSIS — E1121 Type 2 diabetes mellitus with diabetic nephropathy: Secondary | ICD-10-CM | POA: Diagnosis not present

## 2017-08-07 DIAGNOSIS — L03114 Cellulitis of left upper limb: Secondary | ICD-10-CM | POA: Diagnosis not present

## 2017-08-07 MED FILL — DOXYCYCLINE HYCLATE 100 MG: 100 | 10 days supply | Qty: 20 | Fill #0

## 2017-08-07 NOTE — Telephone Encounter (Signed)
Disregard opened in error °

## 2017-08-12 ENCOUNTER — Other Ambulatory Visit: Payer: Self-pay

## 2017-08-12 DIAGNOSIS — H5203 Hypermetropia, bilateral: Secondary | ICD-10-CM | POA: Diagnosis not present

## 2017-08-12 DIAGNOSIS — Z794 Long term (current) use of insulin: Secondary | ICD-10-CM | POA: Diagnosis not present

## 2017-08-12 DIAGNOSIS — H524 Presbyopia: Secondary | ICD-10-CM | POA: Diagnosis not present

## 2017-08-12 DIAGNOSIS — H52223 Regular astigmatism, bilateral: Secondary | ICD-10-CM | POA: Diagnosis not present

## 2017-08-12 DIAGNOSIS — E119 Type 2 diabetes mellitus without complications: Secondary | ICD-10-CM | POA: Diagnosis not present

## 2017-08-12 NOTE — Patient Outreach (Signed)
[facility] Care Management  08/12/2017  [name] [facility] Mar 11, 1945 [id number]   1st outreach attempt to the patient for initial assessment. No answer. HIPAA compliant voicemail left with contact information.  Plan: RN Health Coach will send letter. RN Health Coach will make another attempt to the patient within four business days.  Juanell Fairlyraci Eyana Stolze RN, BSN, [facility] RN Health Coach Disease Management [facility] Direct Dial:  [phone]  Fax: [phone]

## 2017-08-13 DIAGNOSIS — L03114 Cellulitis of left upper limb: Secondary | ICD-10-CM | POA: Diagnosis not present

## 2017-08-13 DIAGNOSIS — M25532 Pain in left wrist: Secondary | ICD-10-CM | POA: Diagnosis not present

## 2017-08-13 DIAGNOSIS — M79642 Pain in left hand: Secondary | ICD-10-CM | POA: Diagnosis not present

## 2017-08-13 DIAGNOSIS — I130 Hypertensive heart and chronic kidney disease with heart failure and stage 1 through stage 4 chronic kidney disease, or unspecified chronic kidney disease: Secondary | ICD-10-CM | POA: Diagnosis not present

## 2017-08-14 DIAGNOSIS — M79642 Pain in left hand: Secondary | ICD-10-CM | POA: Diagnosis not present

## 2017-08-14 DIAGNOSIS — M25532 Pain in left wrist: Secondary | ICD-10-CM | POA: Diagnosis not present

## 2017-08-17 NOTE — Progress Notes (Deleted)
Cardiology Office Note    Date:  08/17/2017   ID:  [name], [name] 1945-12-02, MRN [id number]  PCP:  James Rua, MD  Cardiologist: Dr. Mayford Knife  Chief Complaint: cath discussion   History of Present Illness:   James [name] is a 72 y.o. male with a history of coronary artery diseases/p CABG, diastoliccongestive heart failure,diabetes,hypertension,and hyperlipidemia presented for cath discussion.    James [name] was hospitalized 06/2017 due to sepsis secondary to LLE cellulitis.  Hospital course was complicated by volume overload and echo demonstrated moderately reduced LV function (EF 40-45%).  He subsequently followed up with James [name] and was referred for myocardial perfusion stress test that showed anteroapical ischemia.    Seen by James [name] 07/24/17 to discuss LHC/RHC today. Noted improved shortness of breath since his hospitalization last month. However noted febrile and abdominal pain. Subsequently admitted for acute sigmoid diverticulitis and possible left leg cellulitis. Treated with abx with improvement.  Recommended outpatient pulmonary follow up for left pleural effusion/pleural thickening.   Here today for follow up.          However, he experienced significant diarrhea and stomach pain that prompted him to cut short his antibiotic treatment.  His diarrhea has improved, though he continues to have loose stools and intermittent abdominal pain.  He denies chest pain, shortness of breath, fevers, and chills.  Due to continued loose stools, his PCP had James [name] stop all of his medications except for pramipexole to see if his symptoms would improve.  Catheterization has already been postponed once due to diarrhea.      Past Medical History:  Diagnosis Date  . Aortic atherosclerosis (HCC) 07/07/2017  . Ascending aortic aneurysm (HCC)    cMRI (2/15): Normal LV EF 54%, Mild BAE, Trileaflet Aortic Valve, Upper limits of normal ascending aorta 3.8 cm, Normal aortic  arch 2.3 cm with bovine origin of left carotid  . Atrial flutter (HCC)    Post-op with no reoccurence  . Chronic diastolic heart failure (HCC)    Echo (1/15): Left ventricle: The cavity size was mildly dilated. Mild LVH. EF 55% to 60%. Wall motion was normal; Gr 1 diastolic dysfunction Left atrium: The atrium was mildly dilated. Right atrium: The atrium was mildly to moderately dilated. Aortic arch measures 4.8 cm (moderately dilated); suggest CTA or MRA to better assess.  . Chronic systolic CHF (congestive heart failure) (HCC) 07/24/2017  . Coronary artery disease 10/2011   a. s/p cabg;  b. Myoview (9/15):  ant ischemia, poss TID, EF 43%, high risk >> LHC (9/15): Dist LM 50, pLAD 90 then 100, pCFX 50, mCFX 75, oRCA 75, dRCA 95, L-LAD patent, S-Dx 100, S-OM1 patent, S-PDA patent, EF 55% >> native Dx small caliber and not well suited for PCI; native RCA amenable to PCI and would restore flow to PLA branches but would jeopardize SVG-RCA - Med Rx  . Deviated nasal septum   . Diabetes mellitus    onset 2013  . Dyslipidemia   . GERD (gastroesophageal reflux disease)   . Hx of Doppler ultrasound    Carotid US (9/13): No ICA stenosis  . Hypertension     Past Surgical History:  Procedure Laterality Date  . CARDIAC CATHETERIZATION    . CORONARY ARTERY BYPASS GRAFT  10/27/2011   Procedure: CORONARY ARTERY BYPASS GRAFTING (CABG);  Surgeon: Kerin Perna, MD;  Location: [facility] OR;  Service: Open Heart Surgery;  Laterality: N/A;  Coronary Artery Bypass Grafting times four using left  internal mammary artery and right greater saphenous vein endoscopically harvested  . LEFT HEART CATHETERIZATION WITH CORONARY ANGIOGRAM N/A 10/23/2011   Procedure: LEFT HEART CATHETERIZATION WITH CORONARY ANGIOGRAM;  Surgeon: Quintella Reichertraci R Turner, MD;  Location: [facility] CATH LAB;  Service: Cardiovascular;  Laterality: N/A;  . LEFT HEART CATHETERIZATION WITH CORONARY ANGIOGRAM N/A 11/16/2013   Procedure: LEFT HEART CATHETERIZATION WITH  CORONARY ANGIOGRAM;  Surgeon: Micheline ChapmanMichael D Cooper, MD;  [facility];  Service: Cardiovascular;  Laterality: N/A;  . left knee arthroscopy  2003  . right knee arthroscopy  2006    Current Medications: Prior to Admission medications   Medication Sig Start Date End Date Taking? Authorizing Provider  aspirin 81 MG tablet Take 81 mg by mouth at bedtime.     [provider]  ibuprofen (ADVIL,MOTRIN) 200 MG tablet Take 200 mg by mouth every 6 (six) hours as needed for headache or moderate pain.    [provider]  iron polysaccharides (NIFEREX) 150 MG capsule Take 1 capsule (150 mg total) by mouth daily. 07/30/17   Lenox PondsSilva Zapata, Edwin, MD  metFORMIN (GLUCOPHAGE) 1000 MG tablet Take 1 tablet (1,000 mg total) by mouth 2 (two) times daily with a meal. 07/30/17 08/29/17  Randel PiggSilva Zapata, Dorma RussellEdwin, MD  polyethylene glycol [facility](MIRALAX) packet Take 17 g by mouth daily. 07/30/17   Lenox PondsSilva Zapata, Edwin, MD  potassium chloride 20 MEQ TBCR Take 20 mEq by mouth daily for 3 days. 07/30/17 08/02/17  Lenox PondsSilva Zapata, Edwin, MD  pramipexole (MIRAPEX) 0.75 MG tablet Take 1.5 mg by mouth at bedtime.     [provider]    Allergies:   Lisinopril   Social History   Socioeconomic History  . Marital status: Divorced    Spouse name: Not on file  . Number of children: Not on file  . Years of education: Not on file  . Highest education level: Not on file  Occupational History  . Not on file  Social Needs  . Financial resource strain: Not on file  . Food insecurity:    Worry: Not on file    Inability: Not on file  . Transportation needs:    Medical: Not on file    Non-medical: Not on file  Tobacco Use  . Smoking status: Never Smoker  . Smokeless tobacco: Never Used  Substance and Sexual Activity  . Alcohol use: Yes    Comment: 1 beer a month or less  . Drug use: No  . Sexual activity: Not on file  Lifestyle  . Physical activity:    Days per week: Not on file    Minutes per session: Not  on file  . Stress: Not on file  Relationships  . Social connections:    Talks on phone: Not on file    Gets together: Not on file    Attends religious service: Not on file    Active member of club or organization: Not on file    Attends meetings of clubs or organizations: Not on file    Relationship status: Not on file  Other Topics Concern  . Not on file  Social History Narrative  . Not on file     Family History:  The patient's family history includes Hypertension in his father, mother, and paternal grandfather; Stroke in his father. ***  ROS:   Please see the history of present illness.    ROS All other systems reviewed and are negative.   PHYSICAL EXAM:   VS:  There were no  vitals taken for this visit.   GEN: Well nourished, well developed, in no acute distress  HEENT: normal  Neck: no JVD, carotid bruits, or masses Cardiac: ***RRR; no murmurs, rubs, or gallops,no edema  Respiratory:  clear to auscultation bilaterally, normal work of breathing GI: soft, nontender, nondistended, + BS MS: no deformity or atrophy  Skin: warm and dry, no rash Neuro:  Alert and Oriented x 3, Strength and sensation are intact Psych: euthymic mood, full affect  Wt Readings from Last 3 Encounters:  07/30/17 251 lb 8.7 oz (114.1 kg)  07/24/17 248 lb (112.5 kg)  07/08/17 283 lb (128.4 kg)      Studies/Labs Reviewed:   EKG:  EKG is ordered today.  The ekg ordered today demonstrates ***  Recent Labs: 06/18/2017: NT-Pro BNP 274; TSH 3.340 07/29/2017: ALT 47 07/30/2017: BUN 9; Creatinine, Ser 1.01; Hemoglobin 11.7; Magnesium 2.0; Platelets 356; Potassium 2.7; Sodium 138   Lipid Panel    Component Value Date/Time   CHOL 125 04/01/2016 0000   TRIG 109 04/01/2016 0000   HDL 37 (L) 04/01/2016 0000   CHOLHDL 3.4 04/01/2016 0000   CHOLHDL 5.5 (H) 11/07/2015 0824   VLDL 23 11/07/2015 0824   LDLCALC 66 04/01/2016 0000    Additional studies/ records that were reviewed today include:    Echocardiogram:  06/24/17 Study Conclusions  - Procedure narrative: Transthoracic echocardiography. Image   quality was adequate. The study was technically difficult, as a   result of poor acoustic windows and body habitus. Intravenous   contrast (Definity) was administered. - Left ventricle: The cavity size was normal. Wall thickness was   increased in a pattern of mild LVH. Systolic function was mildly   to moderately reduced. The estimated ejection fraction was in the   range of 40% to 45%. Incoordinate septal motion and possible   anteroseptal hypokinesis. The study is not technically sufficient   to allow evaluation of LV diastolic function. - Left atrium: The atrium was normal in size. - Right atrium: Moderately dilated. - Inferior vena cava: The vessel was dilated. The respirophasic   diameter changes were blunted (< 50%), consistent with elevated   central venous pressure.  Impressions:  - Technically difficult study. Definity contrast given. LVEF   40-45%, mild LVH, incoordinate septal motion and possible   anteroseptal hypokinesis, normal LA size, moderate RAE, dilated   IVC.   Stress test 07/09/17  The left ventricular ejection fraction is moderately decreased (30-44%). Asynchronous contraction  Nuclear stress EF: 40%.  There was no ST segment deviation noted during stress.  Defect 1: There is a medium defect of mild severity present in the mid anterior, apical anterior and apex location. Small area of ischemia cannot be excluded  Intermediate risk study is mainly on reduced EF. Post CABG.    ASSESSMENT & PLAN:    1. CAD and cardiomyopathy - Recent low new EF with abnormal stress test. He will need L&R cath.       Medication Adjustments/Labs and Tests Ordered: Current medicines are reviewed at length with the patient today.  Concerns regarding medicines are outlined above.  Medication changes, Labs and Tests ordered today are listed in the Patient  Instructions below. There are no Patient Instructions on file for this visit.   Lorelei Pont, [place]  08/17/2017 3:29 PM    [facility] [address] [place], [place], [place]  [address] Phone: [phone]; Fax: [phone]

## 2017-08-18 ENCOUNTER — Ambulatory Visit: Payer: Medicare HMO | Admitting: Physician Assistant

## 2017-08-19 MED FILL — FLUOROMETHOLONE 0.1% DROPS: 0.1 | 25 days supply | Qty: 5 | Fill #0

## 2017-08-21 DIAGNOSIS — H40051 Ocular hypertension, right eye: Secondary | ICD-10-CM | POA: Diagnosis not present

## 2017-08-21 DIAGNOSIS — H11421 Conjunctival edema, right eye: Secondary | ICD-10-CM | POA: Diagnosis not present

## 2017-08-21 DIAGNOSIS — H0520 Unspecified exophthalmos: Secondary | ICD-10-CM | POA: Diagnosis not present

## 2017-08-21 MED FILL — CEPHALEXIN 500 MG CAPSULE: 500 | 14 days supply | Qty: 28 | Fill #0

## 2017-08-24 DIAGNOSIS — Z794 Long term (current) use of insulin: Secondary | ICD-10-CM | POA: Diagnosis not present

## 2017-08-24 DIAGNOSIS — Z7982 Long term (current) use of aspirin: Secondary | ICD-10-CM | POA: Diagnosis not present

## 2017-08-24 DIAGNOSIS — L03116 Cellulitis of left lower limb: Secondary | ICD-10-CM | POA: Diagnosis not present

## 2017-08-24 DIAGNOSIS — K219 Gastro-esophageal reflux disease without esophagitis: Secondary | ICD-10-CM | POA: Diagnosis not present

## 2017-08-24 DIAGNOSIS — I2510 Atherosclerotic heart disease of native coronary artery without angina pectoris: Secondary | ICD-10-CM | POA: Diagnosis not present

## 2017-08-24 DIAGNOSIS — H0520 Unspecified exophthalmos: Secondary | ICD-10-CM | POA: Diagnosis not present

## 2017-08-24 DIAGNOSIS — I110 Hypertensive heart disease with heart failure: Secondary | ICD-10-CM | POA: Diagnosis not present

## 2017-08-24 DIAGNOSIS — H11421 Conjunctival edema, right eye: Secondary | ICD-10-CM | POA: Diagnosis not present

## 2017-08-24 DIAGNOSIS — H40051 Ocular hypertension, right eye: Secondary | ICD-10-CM | POA: Diagnosis not present

## 2017-08-24 DIAGNOSIS — I5030 Unspecified diastolic (congestive) heart failure: Secondary | ICD-10-CM | POA: Diagnosis not present

## 2017-08-24 DIAGNOSIS — L97829 Non-pressure chronic ulcer of other part of left lower leg with unspecified severity: Secondary | ICD-10-CM | POA: Diagnosis not present

## 2017-08-24 DIAGNOSIS — E119 Type 2 diabetes mellitus without complications: Secondary | ICD-10-CM | POA: Diagnosis not present

## 2017-08-24 MED FILL — NEO/POLY/DEXAMET EYE OINT: 3.5-10000-0 | 30 days supply | Qty: 4 | Fill #0

## 2017-08-26 MED FILL — PRAMIPEXOLE 0.75 MG TABLET: 0.75 | 30 days supply | Qty: 60 | Fill #2

## 2017-08-26 MED FILL — METOPROLOL SUCCINATE ER 100: 100 | 30 days supply | Qty: 30 | Fill #6

## 2017-08-28 DIAGNOSIS — H0520 Unspecified exophthalmos: Secondary | ICD-10-CM | POA: Diagnosis not present

## 2017-08-28 DIAGNOSIS — H532 Diplopia: Secondary | ICD-10-CM | POA: Diagnosis not present

## 2017-08-28 DIAGNOSIS — H11421 Conjunctival edema, right eye: Secondary | ICD-10-CM | POA: Diagnosis not present

## 2017-08-31 MED FILL — CEPHALEXIN 500 MG CAPSULE: 500 | 14 days supply | Qty: 28 | Fill #0

## 2017-09-02 ENCOUNTER — Other Ambulatory Visit: Payer: Self-pay

## 2017-09-02 NOTE — Patient Outreach (Signed)
[facility] Care Management  09/02/2017  [name] [birth year] [id number]   2nd outreach to the patient for initial assessment. HIPAA verified.  The patient stated that this time was not a good time to this week.  He asked if I would call him next week in the morning.  Plan:  RN Health Coach will call the patient next week at his request.  James Fairlyraci Leandre Wien RN, BSN, [facility] RN Health Coach Disease Management [facility] Direct Dial:  [phone]  Fax: [phone]

## 2017-09-03 ENCOUNTER — Ambulatory Visit: Payer: Medicare HMO | Admitting: Cardiology

## 2017-09-07 ENCOUNTER — Other Ambulatory Visit: Payer: Self-pay | Admitting: Surgery

## 2017-09-07 DIAGNOSIS — H532 Diplopia: Secondary | ICD-10-CM | POA: Diagnosis not present

## 2017-09-07 DIAGNOSIS — E119 Type 2 diabetes mellitus without complications: Secondary | ICD-10-CM | POA: Diagnosis not present

## 2017-09-07 DIAGNOSIS — H0520 Unspecified exophthalmos: Secondary | ICD-10-CM | POA: Diagnosis not present

## 2017-09-07 DIAGNOSIS — H35033 Hypertensive retinopathy, bilateral: Secondary | ICD-10-CM | POA: Diagnosis not present

## 2017-09-08 ENCOUNTER — Other Ambulatory Visit: Payer: Self-pay

## 2017-09-08 NOTE — Patient Outreach (Signed)
[facility]) Care Management  09/08/2017  James [name] James [name] James [name] [birth year] [id number]   3rd outreach attempt to the patient for initial assessment. Spoke with the patient and given information about services.He stated that he was unable to talk today and did not verify HIPAA.  He reports having an appointment for his eyes today and does not know when he will be able to complete the assessment.  The patient was given contact information and ask to return the call.  Plan: RN Health Coach will send successful letter with [facility]. If no response from the patient within 10 business day RN Health Coach will proceed with case closure.  Juanell Fairlyraci Estel Tonelli RN, BSN, [facility] RN Health Coach Disease Management [facility] Direct Dial:  [phone]  Fax: [phone]

## 2017-09-10 ENCOUNTER — Ambulatory Visit
Admission: RE | Admit: 2017-09-10 | Discharge: 2017-09-10 | Disposition: A | Discharge status: Home / Self Care | Payer: Medicare HMO | Source: Ambulatory Visit | Attending: Surgery | Admitting: Surgery

## 2017-09-10 DIAGNOSIS — H0520 Unspecified exophthalmos: Secondary | ICD-10-CM

## 2017-09-10 DIAGNOSIS — J320 Chronic maxillary sinusitis: Secondary | ICD-10-CM | POA: Diagnosis not present

## 2017-09-10 MED ORDER — IOPAMIDOL (ISOVUE-300) INJECTION 61%
75.0000 mL | Freq: Once | INTRAVENOUS | Status: AC | PRN
  Administered 2017-09-10: 75 mL via INTRAVENOUS

## 2017-09-10 MED FILL — NOVOLOG MIX 70-30 FLEXPEN S: (70-30) 100 | 4 days supply | Qty: 3 | Fill #2

## 2017-09-11 ENCOUNTER — Other Ambulatory Visit: Payer: Self-pay

## 2017-09-11 DIAGNOSIS — H532 Diplopia: Secondary | ICD-10-CM | POA: Diagnosis not present

## 2017-09-11 DIAGNOSIS — H0520 Unspecified exophthalmos: Secondary | ICD-10-CM | POA: Diagnosis not present

## 2017-09-11 MED FILL — CEPHALEXIN 500 MG CAPSULE: 500 | 10 days supply | Qty: 30 | Fill #0

## 2017-09-11 NOTE — Patient Outreach (Signed)
[facility]) Care Management  09/11/2017  [name] [birth year] [id number]    The patient has not responded to calls or letter.   Plan: RN Health Coach will proceed with case closure due to unable to contact.    Juanell Fairlyraci Briahnna Harries RN, BSN, [facility] RN Health Coach Disease Management [facility] Direct Dial:  [phone]  Fax: [phone]

## 2017-09-14 DIAGNOSIS — J320 Chronic maxillary sinusitis: Secondary | ICD-10-CM | POA: Diagnosis not present

## 2017-09-14 DIAGNOSIS — E1121 Type 2 diabetes mellitus with diabetic nephropathy: Secondary | ICD-10-CM | POA: Diagnosis not present

## 2017-09-14 DIAGNOSIS — Z794 Long term (current) use of insulin: Secondary | ICD-10-CM | POA: Diagnosis not present

## 2017-09-16 ENCOUNTER — Other Ambulatory Visit: Payer: Medicare HMO

## 2017-09-24 ENCOUNTER — Ambulatory Visit (INDEPENDENT_AMBULATORY_CARE_PROVIDER_SITE_OTHER): Payer: Medicare HMO | Admitting: Physician Assistant

## 2017-09-24 ENCOUNTER — Encounter: Payer: Self-pay | Admitting: Physician Assistant

## 2017-09-24 VITALS — BP 148/94 | HR 68 | Ht 69.0 in | Wt 244.2 lb

## 2017-09-24 DIAGNOSIS — N183 Chronic kidney disease, stage 3 unspecified: Secondary | ICD-10-CM

## 2017-09-24 DIAGNOSIS — I5022 Chronic systolic (congestive) heart failure: Secondary | ICD-10-CM

## 2017-09-24 DIAGNOSIS — E7800 Pure hypercholesterolemia, unspecified: Secondary | ICD-10-CM

## 2017-09-24 DIAGNOSIS — I10 Essential (primary) hypertension: Secondary | ICD-10-CM

## 2017-09-24 DIAGNOSIS — R9439 Abnormal result of other cardiovascular function study: Secondary | ICD-10-CM

## 2017-09-24 LAB — BASIC METABOLIC PANEL
BUN/Creatinine Ratio: 13 (ref 10–24)
BUN: 14 mg/dL (ref 8–27)
CALCIUM: 9.6 mg/dL (ref 8.6–10.2)
CHLORIDE: 96 mmol/L (ref 96–106)
CO2: 25 mmol/L (ref 20–29)
Creatinine, Ser: 1.04 mg/dL (ref 0.76–1.27)
GFR calc Af Amer: 83 mL/min/{1.73_m2} (ref >59)
GFR calc non Af Amer: 71 mL/min/{1.73_m2} (ref >59)
GLUCOSE: 138 mg/dL — AB (ref 65–99)
Potassium: 4.4 mmol/L (ref 3.5–5.2)
Sodium: 135 mmol/L (ref 134–144)

## 2017-09-24 LAB — CBC
HEMATOCRIT: 34.2 % — AB (ref 37.5–51.0)
HEMOGLOBIN: 11.2 g/dL — AB (ref 13.0–17.7)
MCH: 27.4 pg (ref 26.6–33.0)
MCHC: 32.7 g/dL (ref 31.5–35.7)
MCV: 84 fL (ref 79–97)
Platelets: 362 10*3/uL (ref 150–450)
RBC: 4.09 x10E6/uL — ABNORMAL LOW (ref 4.14–5.80)
RDW: 15.9 % — ABNORMAL HIGH (ref 12.3–15.4)
WBC: 9.2 10*3/uL (ref 3.4–10.8)

## 2017-09-24 MED ORDER — EZETIMIBE 10 MG PO TABS: 10.0000 mg | ORAL_TABLET | Freq: Every day | ORAL | 3 refills | Status: DC

## 2017-09-24 MED ORDER — PRAVASTATIN SODIUM 40 MG PO TABS: 40.0000 mg | ORAL_TABLET | Freq: Every evening | ORAL | 3 refills | Status: DC

## 2017-09-24 MED FILL — EZETIMIBE 10 MG TABLET: 10 | 90 days supply | Qty: 90 | Fill #0

## 2017-09-24 MED FILL — PRAVASTATIN NA 40 MG TAB: 40 | 90 days supply | Qty: 90 | Fill #0

## 2017-09-24 NOTE — Progress Notes (Signed)
 Cardiology Office Note    Date:  09/24/2017   ID:  James [name], DOB 12/02/1945, MRN [id number]  PCP:  Meyers, Stephen, MD  Cardiologist:  Dr. Turner  Chief Complaint: cath discussion   History of Present Illness:   James [name] is a 72 y.o. male  with a history of coronary artery diseases/p CABG, diastoliccongestive heart failure,diabetes,hypertension,and hyperlipidemia presents for cath discussion.   Recently echo demonstrated moderately reduced LV function (EF 40-45% during admission sepsis 2nd to LE cellulitis. Outpatient myocardial perfusion stress test showed anteroapical ischemia. He was scheduled for R & L cath however complained diarrhea and stomach pain at day of cath leading to cancellation. Send to ER and admitted for acute sigmoid diverticulitis. Treated with broad spectrum antibiotics.   Now here for cath discussion.  His lisinopril and statin was discontinued during admission.  He denies chest pain, shortness of breath, orthopnea,, syncope or lower extremity edema.  Having vision issue may be upcoming cataract surgery.  Also dealing with sinus congestion for the past couple of weeks, has appointment with ENT next Tuesday.  Blood pressure elevated today however runs normal at home in 120-130s/70-80s.  Compliant with low-sodium diet.   Past Medical History:  Diagnosis Date  . Abnormal EKG 03/09/2013  . Abnormal nuclear cardiac imaging test 11/14/2013  . Aortic atherosclerosis (HCC) 07/07/2017  . Ascending aortic aneurysm (HCC)    cMRI (2/15): Normal LV EF 54%, Mild BAE, Trileaflet Aortic Valve, Upper limits of normal ascending aorta 3.8 cm, Normal aortic arch 2.3 cm with bovine origin of left carotid  . Atrial flutter (HCC)    Post-op with no reoccurence  . Chronic diastolic heart failure (HCC)    Echo (1/15): Left ventricle: The cavity size was mildly dilated. Mild LVH. EF 55% to 60%. Wall motion was normal; Gr 1 diastolic dysfunction Left atrium: The atrium was  mildly dilated. Right atrium: The atrium was mildly to moderately dilated. Aortic arch measures 4.8 cm (moderately dilated); suggest CTA or MRA to better assess.  . Chronic systolic CHF (congestive heart failure) (HCC) 07/24/2017  . CKD (chronic kidney disease), stage III (HCC) 07/24/2017  . Coronary artery disease 10/2011   a. s/p cabg;  b. Myoview (9/15):  ant ischemia, poss TID, EF 43%, high risk >> LHC (9/15): Dist LM 50, pLAD 90 then 100, pCFX 50, mCFX 75, oRCA 75, dRCA 95, L-LAD patent, S-Dx 100, S-OM1 patent, S-PDA patent, EF 55% >> native Dx small caliber and not well suited for PCI; native RCA amenable to PCI and would restore flow to PLA branches but would jeopardize SVG-RCA - Med Rx  . Deviated nasal septum   . Diabetes mellitus    onset 2013  . Dilated aortic root (HCC)   . Diverticulitis of sigmoid colon 07/24/2017  . DM (diabetes mellitus), type 2, uncontrolled with complications (HCC) 10/24/2011  . Dyslipidemia   . Edema 02/09/2013  . GERD (gastroesophageal reflux disease)   . Hx of Doppler ultrasound    Carotid US (9/13): No ICA stenosis  . Hyperlipidemia 10/24/2011  . Hypertension   . Pleural thickening 07/24/2017    Past Surgical History:  Procedure Laterality Date  . CARDIAC CATHETERIZATION    . CORONARY ARTERY BYPASS GRAFT  10/27/2011   Procedure: CORONARY ARTERY BYPASS GRAFTING (CABG);  Surgeon: Peter Van Trigt, MD;  Location: [facility] OR;  Service: Open Heart Surgery;  Laterality: N/A;  Coronary Artery Bypass Grafting times four using left internal mammary artery and right greater saphenous   vein endoscopically harvested  . LEFT HEART CATHETERIZATION WITH CORONARY ANGIOGRAM N/A 10/23/2011   Procedure: LEFT HEART CATHETERIZATION WITH CORONARY ANGIOGRAM;  Surgeon: Traci R Turner, MD;  Location: [facility] CATH LAB;  Service: Cardiovascular;  Laterality: N/A;  . LEFT HEART CATHETERIZATION WITH CORONARY ANGIOGRAM N/A 11/16/2013   Procedure: LEFT HEART CATHETERIZATION WITH CORONARY ANGIOGRAM;   Surgeon: Michael D Cooper, MD;  Location: [facility] CATH LAB;  Service: Cardiovascular;  Laterality: N/A;  . left knee arthroscopy  2003  . right knee arthroscopy  2006    Current Medications:  Prior to Admission medications   Medication Sig Start Date End Date Taking? Authorizing Provider  aspirin 81 MG tablet Take 81 mg by mouth at bedtime.    Yes [provider]  iron polysaccharides (NIFEREX) 150 MG capsule Take 1 capsule (150 mg total) by mouth daily. 07/30/17  Yes Silva Zapata, Edwin, MD  metoprolol succinate (TOPROL-XL) 100 MG 24 hr tablet Take 1 tablet by mouth daily. 08/26/17  Yes [provider]  NOVOLOG MIX 70/30 FLEXPEN (70-30) 100 UNIT/ML FlexPen Inject 1 Dose into the skin daily. 09/10/17  Yes [provider]  polyethylene glycol (MIRALAX) packet Take 17 g by mouth daily. 07/30/17  Yes Silva Zapata, Edwin, MD  pramipexole (MIRAPEX) 0.75 MG tablet Take 1.5 mg by mouth at bedtime.    Yes [provider]  ezetimibe (ZETIA) 10 MG tablet Take 1 tablet (10 mg total) by mouth daily. 09/24/17 12/23/17  Konner Warrior, PA  metFORMIN (GLUCOPHAGE) 1000 MG tablet Take 1 tablet (1,000 mg total) by mouth 2 (two) times daily with a meal. 07/30/17 08/29/17  Silva Zapata, Edwin, MD  potassium chloride 20 MEQ TBCR Take 20 mEq by mouth daily for 3 days. 07/30/17 08/02/17  Silva Zapata, Edwin, MD  pravastatin (PRAVACHOL) 40 MG tablet Take 1 tablet (40 mg total) by mouth every evening. 09/24/17 12/23/17  Payne Garske, PA     Allergies:   Lisinopril   Social History   Socioeconomic History  . Marital status: Divorced    Spouse name: Not on file  . Number of children: Not on file  . Years of education: Not on file  . Highest education level: Not on file  Occupational History  . Not on file  Social Needs  . Financial resource strain: Not on file  . Food insecurity:    Worry: Not on file    Inability: Not on file  . Transportation needs:    Medical: Not on file     Non-medical: Not on file  Tobacco Use  . Smoking status: Never Smoker  . Smokeless tobacco: Never Used  Substance and Sexual Activity  . Alcohol use: Yes    Comment: 1 beer a month or less  . Drug use: No  . Sexual activity: Not on file  Lifestyle  . Physical activity:    Days per week: Not on file    Minutes per session: Not on file  . Stress: Not on file  Relationships  . Social connections:    Talks on phone: Not on file    Gets together: Not on file    Attends religious service: Not on file    Active member of club or organization: Not on file    Attends meetings of clubs or organizations: Not on file    Relationship status: Not on file  Other Topics Concern  . Not on file  Social History Narrative  . Not on file     Family   History:  The patient's family history includes Hypertension in his father, mother, and paternal grandfather; Stroke in his father.   ROS:   Please see the history of present illness.    ROS All other systems reviewed and are negative.   PHYSICAL EXAM:   VS:  BP (!) 148/94   Pulse 68   Ht 5' 9" (1.753 m)   Wt 244 lb 3.2 oz (110.8 kg)   SpO2 97%   BMI 36.06 kg/m    GEN: Well nourished, well developed, in no acute distress  HEENT: normal  Neck: no JVD, carotid bruits, or masses Cardiac: RRR; no murmurs, rubs, or gallops, trace left lower extremity edema with venous stasis Respiratory:  clear to auscultation bilaterally, normal work of breathing GI: soft, nontender, nondistended, + BS MS: no deformity or atrophy  Skin: warm and dry, no rash Neuro:  Alert and Oriented x 3, Strength and sensation are intact Psych: euthymic mood, full affect  Wt Readings from Last 3 Encounters:  09/24/17 244 lb 3.2 oz (110.8 kg)  07/30/17 251 lb 8.7 oz (114.1 kg)  07/24/17 248 lb (112.5 kg)      Studies/Labs Reviewed:   EKG:  EKG is ordered today.  The ekg ordered today demonstrates normal sinus rhythm with nonspecific ST/T wave  abnormality  Recent Labs: 06/18/2017: NT-Pro BNP 274; TSH 3.340 07/29/2017: ALT 47 07/30/2017: BUN 9; Creatinine, Ser 1.01; Hemoglobin 11.7; Magnesium 2.0; Platelets 356; Potassium 2.7; Sodium 138   Lipid Panel    Component Value Date/Time   CHOL 125 04/01/2016 0000   TRIG 109 04/01/2016 0000   HDL 37 (L) 04/01/2016 0000   CHOLHDL 3.4 04/01/2016 0000   CHOLHDL 5.5 (H) 11/07/2015 0824   VLDL 23 11/07/2015 0824   LDLCALC 66 04/01/2016 0000    Additional studies/ records that were reviewed today include:   Echo (06/24/17): - Procedure narrative: Transthoracic echocardiography. Image quality was adequate. The study was technically difficult, as a result of poor acoustic windows and body habitus. Intravenous contrast (Definity) was administered. - Left ventricle: The cavity size was normal. Wall thickness was increased in a pattern of mild LVH. Systolic function was mildly to moderately reduced. The estimated ejection fraction was in the range of 40% to 45%. Incoordinate septal motion and possible anteroseptal hypokinesis. The study is not technically sufficient to allow evaluation of LV diastolic function. - Left atrium: The atrium was normal in size. - Right atrium: Moderately dilated. - Inferior vena cava: The vessel was dilated. The respirophasic diameter changes were blunted (<50%), consistent with elevated central venous pressure.   Myoview (07/09/17): Intermediate risk study with anteroapical reversible defect and moderately reduced LVEF.  ASSESSMENT & PLAN:    1. Abnormal stress test - Intermediate risk study with anteroapical reversible defect and moderately reduced LVEF.  Previously cancer left and right cath due to diarrhea found to have diverticulitis.  No anginal symptoms. -We will schedule left and right cath as recommended. The patient understands that risks include but are not limited to stroke (1 in 1000), death (1 in 1000), kidney failure  [usually temporary] (1 in 500), bleeding (1 in 200), allergic reaction [possibly serious] (1 in 200), and agrees to proceed.  -Continue aspirin 81 mg and Toprol-XL 100 mg daily.  Will restart his Pravachol and Zetia.  Recheck labs during follow-up.  He already has today.  2.  Chronic systolic heart failure -LV function of 40 to 45% by echocardiogram.  Previously on torsemide however discontinued in the hospital.    Euvolemic by exam. -Lisinopril discontinued due to cough.  Discussed initiation of ARB however he wants to hold until cath. -Continue beta-blocker.  3.  Diabetes mellitus -Will hold metformin for cath.  Consider ARB given CHF and diabetes during follow-up.   4.  Hypertensive heart disease -Blood pressure normal at home however elevated today.  Continue beta-blocker.  Consider ARB as above during follow up.   Medication Adjustments/Labs and Tests Ordered: Current medicines are reviewed at length with the patient today.  Concerns regarding medicines are outlined above.  Medication changes, Labs and Tests ordered today are listed in the Patient Instructions below. Patient Instructions  Medication Instructions:  1. START PRAVACHOL 40 MG DAILY; RX HAS BEEN SENT IN 2. START ZETIA 10 MG DAILY; RX HAS BEEN SENT IN  Labwork: 1. TODAY BMET, CBC  2. FASTING LIPID AND LIVER PANEL TO BE DONE AT YOUR FOLLOW UP APPT   Testing/Procedures: Your physician has requested that you have a cardiac catheterization. Cardiac catheterization is used to diagnose and/or treat various heart conditions. Doctors may recommend this procedure for a number of different reasons. The most common reason is to evaluate chest pain. Chest pain can be a symptom of coronary artery disease (CAD), and cardiac catheterization can show whether plaque is narrowing or blocking your heart's arteries. This procedure is also used to evaluate the valves, as well as measure the blood flow and oxygen levels in different parts of your  heart. For further information please visit [email or web address]. Please follow instruction sheet, as given.    Follow-Up:  10/14/17 @ 12 PM WITH MICHELE LENZE, PAC...    Any Other Special Instructions Will Be Listed Below (If Applicable).  If you need a refill on your cardiac medications before your next appointment, please call your pharmacy.      [facility] [address], [address] [place] [address] Dept: [phone] Loc: [phone]  James [name]  09/24/2017  You are scheduled for a Cardiac Catheterization on Wednesday, August 14 with Dr. Jayadeep Varanasi.  1. Please arrive at the North Tower (Main Entrance A) at [facility]: [address] [place], [place] [address] at 6:30 AM (This time is two hours before your procedure to ensure your preparation). Free valet parking service is available.   Special note: Every effort is made to have your procedure done on time. Please understand that emergencies sometimes delay scheduled procedures.  2. Diet: Do not eat solid foods after midnight.  The patient may have clear liquids until 5am upon the day of the procedure.  3. Labs: You will need to have blood drawn on Thursday, August 8 at [facility] [address], [place]  Open: 7:30am - 5pm    Phone: [phone]. You do not need to be fasting.  4. Medication instructions in preparation for your procedure:   Contrast Allergy: No   MAKE SURE TO NOT TAKE ANY NSAID'S, ADVIL, IBUPROFEN, MOTRIN FOR 24 HOURS BEFORE   INSULIN INSTRUCTIONS: THE NIGHT BEFORE YOU WILL NEED TO TAKE 1/2 DOSE OF YOUR NOVOLOG.  THE MORNING OF CATH 09/30/17 YOU WILL NOT TAKE ANY INSULIN BEFORE CATH   ORAL DIABETES MEDICATIONS INSTRUCTIONS: BEGINNING ON 09/30/17 YOU WILL NOT TAKE ANY METFORMIN THE MORNING OF CATH AND HOLD FOR 48 HOURS AFTER YOUR CATH  On the morning of your  procedure, take your Aspirin 81 MG and any morning medicines NOT listed above.    You may use sips of water.  5. Plan for one night stay--bring personal belongings. 6. Bring a current list of your medications and current insurance cards. 7. You MUST have a responsible person to drive you home. 8. Someone MUST be with you the first 24 hours after you arrive home or your discharge will be delayed. 9. Please wear clothes that are easy to get on and off and wear slip-on shoes.  Thank you for allowing us to care for you!   -- [facility]      Signed, Sui Kasparek, PA  09/24/2017 10:51 AM    [facility] [address], [place], [place]  [address] Phone: [phone]; Fax: [phone]  

## 2017-09-24 NOTE — Patient Instructions (Addendum)
Medication Instructions:  1. START PRAVACHOL 40 MG DAILY; RX HAS BEEN SENT IN 2. START ZETIA 10 MG DAILY; RX HAS BEEN SENT IN  Labwork: 1. TODAY BMET, CBC  2. FASTING LIPID AND LIVER PANEL TO BE DONE AT YOUR FOLLOW UP APPT   Testing/Procedures: Your physician has requested that you have a cardiac catheterization. Cardiac catheterization is used to diagnose and/or treat various heart conditions. Doctors may recommend this procedure for a number of different reasons. The most common reason is to evaluate chest pain. Chest pain can be a symptom of coronary artery disease (CAD), and cardiac catheterization can show whether plaque is narrowing or blocking your heart's arteries. This procedure is also used to evaluate the valves, as well as measure the blood flow and oxygen levels in different parts of your heart. For further information please visit [email or web address] Please follow instruction sheet, as given.    Follow-Up:  10/14/17 @ 12 PM WITH MICHELE LENZE, PAC...    Any Other Special Instructions Will Be Listed Below (If Applicable).  If you need a refill on your cardiac medications before your next appointment, please call your pharmacy.     [facility] [facility] [address], [address] [place] [address] Dept: [phone] Loc: [phone]  [name] W [facility]  09/24/2017  You are scheduled for a Cardiac Catheterization on Wednesday, August 14 with Dr. Lance MussJayadeep Varanasi.  1. [facility]: [address] [place], [place] [address] at 6:30 AM (This time is two hours before your procedure to ensure your preparation). Free valet parking service is available.   Special note: Every effort is made to have your procedure done on time. Please understand that emergencies sometimes delay scheduled procedures.  2. Diet: Do not eat solid foods after  midnight.  The patient may have clear liquids until 5am upon the day of the procedure.  3. Labs: You [facility] August [facility] [address], [place]  Open: 7:30am - 5pm    Phone: [phone]. You do not need to be fasting.  4. Medication instructions in preparation for your procedure:   Contrast Allergy: No   MAKE SURE TO NOT TAKE ANY NSAID'S, ADVIL, IBUPROFEN, MOTRIN FOR 24 HOURS BEFORE   INSULIN INSTRUCTIONS: THE NIGHT BEFORE YOU WILL NEED TO TAKE 1/2 DOSE OF YOUR NOVOLOG.  THE MORNING OF CATH 09/30/17 YOU WILL NOT TAKE ANY INSULIN BEFORE CATH   ORAL DIABETES MEDICATIONS INSTRUCTIONS: BEGINNING ON 09/30/17 YOU WILL NOT TAKE ANY METFORMIN THE MORNING OF CATH AND HOLD FOR 48 HOURS AFTER YOUR CATH  On the morning of your procedure, take your Aspirin 81 MG and any morning medicines NOT listed above.  You may use sips of water.  5. Plan for one night stay--bring personal belongings. 6. Bring a current list of your medications and current insurance cards. 7. You MUST have a responsible person to drive you home. 8. Someone MUST be with you the first 24 hours after you arrive home or your discharge will be delayed. 9. Please wear clothes that are easy to get on and off and wear slip-on shoes.  Thank you for allowing us to care for you!   -- [place] [facility]

## 2017-09-24 NOTE — H&P (View-Only) (Signed)
Cardiology Office Note    Date:  09/24/2017   ID:  James [name], James [name] 05-Feb-1946, MRN [id number]  PCP:  Joycelyn Rua, MD  Cardiologist:  Dr. Mayford Knife  Chief Complaint: cath discussion   History of Present Illness:   James [name] is a 72 y.o. male  with a history of coronary artery diseases/p CABG, diastoliccongestive heart failure,diabetes,hypertension,and hyperlipidemia presents for cath discussion.   Recently echo demonstrated moderately reduced LV function (EF 40-45% during admission sepsis 2nd to LE cellulitis. Outpatient myocardial perfusion stress test showed anteroapical ischemia. He was scheduled for R & L cath however complained diarrhea and stomach pain at day of cath leading to cancellation. Send to ER and admitted for acute sigmoid diverticulitis. Treated with broad spectrum antibiotics.   Now here for cath discussion.  His lisinopril and statin was discontinued during admission.  He denies chest pain, shortness of breath, orthopnea,, syncope or lower extremity edema.  Having vision issue may be upcoming cataract surgery.  Also dealing with sinus congestion for the past couple of weeks, has appointment with ENT next Tuesday.  Blood pressure elevated today however runs normal at home in 120-130s/70-80s.  Compliant with low-sodium diet.   Past Medical History:  Diagnosis Date  . Abnormal EKG 03/09/2013  . Abnormal nuclear cardiac imaging test 11/14/2013  . Aortic atherosclerosis (HCC) 07/07/2017  . Ascending aortic aneurysm (HCC)    cMRI (2/15): Normal LV EF 54%, Mild BAE, Trileaflet Aortic Valve, Upper limits of normal ascending aorta 3.8 cm, Normal aortic arch 2.3 cm with bovine origin of left carotid  . Atrial flutter (HCC)    Post-op with no reoccurence  . Chronic diastolic heart failure (HCC)    Echo (1/15): Left ventricle: The cavity size was mildly dilated. Mild LVH. EF 55% to 60%. Wall motion was normal; Gr 1 diastolic dysfunction Left atrium: The atrium was  mildly dilated. Right atrium: The atrium was mildly to moderately dilated. Aortic arch measures 4.8 cm (moderately dilated); suggest CTA or MRA to better assess.  . Chronic systolic CHF (congestive heart failure) (HCC) 07/24/2017  . CKD (chronic kidney disease), stage III (HCC) 07/24/2017  . Coronary artery disease 10/2011   a. s/p cabg;  b. Myoview (9/15):  ant ischemia, poss TID, EF 43%, high risk >> LHC (9/15): Dist LM 50, pLAD 90 then 100, pCFX 50, mCFX 75, oRCA 75, dRCA 95, L-LAD patent, S-Dx 100, S-OM1 patent, S-PDA patent, EF 55% >> native Dx small caliber and not well suited for PCI; native RCA amenable to PCI and would restore flow to PLA branches but would jeopardize SVG-RCA - Med Rx  . Deviated nasal septum   . Diabetes mellitus    onset 2013  . Dilated aortic root (HCC)   . Diverticulitis of sigmoid colon 07/24/2017  . DM (diabetes mellitus), type 2, uncontrolled with complications (HCC) 10/24/2011  . Dyslipidemia   . Edema 02/09/2013  . GERD (gastroesophageal reflux disease)   . Hx of Doppler ultrasound    Carotid US (9/13): No ICA stenosis  . Hyperlipidemia 10/24/2011  . Hypertension   . Pleural thickening 07/24/2017    Past Surgical History:  Procedure Laterality Date  . CARDIAC CATHETERIZATION    . CORONARY ARTERY BYPASS GRAFT  10/27/2011   Procedure: CORONARY ARTERY BYPASS GRAFTING (CABG);  Surgeon: Kerin Perna, MD;  Location: [facility] OR;  Service: Open Heart Surgery;  Laterality: N/A;  Coronary Artery Bypass Grafting times four using left internal mammary artery and right greater saphenous  vein endoscopically harvested  . LEFT HEART CATHETERIZATION WITH CORONARY ANGIOGRAM N/A 10/23/2011   Procedure: LEFT HEART CATHETERIZATION WITH CORONARY ANGIOGRAM;  Surgeon: Quintella Reichertraci R Turner, MD;  Location: [facility] CATH LAB;  Service: Cardiovascular;  Laterality: N/A;  . LEFT HEART CATHETERIZATION WITH CORONARY ANGIOGRAM N/A 11/16/2013   Procedure: LEFT HEART CATHETERIZATION WITH CORONARY ANGIOGRAM;   Surgeon: Micheline ChapmanMichael D Cooper, MD;  [facility];  Service: Cardiovascular;  Laterality: N/A;  . left knee arthroscopy  2003  . right knee arthroscopy  2006    Current Medications:  Prior to Admission medications   Medication Sig Start Date End Date Taking? Authorizing Provider  aspirin 81 MG tablet Take 81 mg by mouth at bedtime.    Yes [provider]  iron polysaccharides (NIFEREX) 150 MG capsule Take 1 capsule (150 mg total) by mouth daily. 07/30/17  Yes Randel PiggSilva Zapata, Dorma RussellEdwin, MD  metoprolol succinate (TOPROL-XL) 100 MG 24 hr tablet Take 1 tablet by mouth daily. 08/26/17  Yes [provider]  NOVOLOG MIX 70/30 FLEXPEN (70-30) 100 UNIT/ML FlexPen Inject 1 Dose into the skin daily. 09/10/17  Yes [provider]  polyethylene glycol (MIRALAX) packet Take 17 g by mouth daily. 07/30/17  Yes Randel PiggSilva Zapata, Dorma RussellEdwin, MD  pramipexole (MIRAPEX) 0.75 MG tablet Take 1.5 mg by mouth at bedtime.    Yes [provider]  ezetimibe (ZETIA) 10 MG tablet Take 1 tablet (10 mg total) by mouth daily. 09/24/17 12/23/17  James PasseyBhagat, Captain Blucher, PA  metFORMIN (GLUCOPHAGE) 1000 MG tablet Take 1 tablet (1,000 mg total) by mouth 2 (two) times daily with a meal. 07/30/17 08/29/17  Randel PiggSilva Zapata, Dorma RussellEdwin, MD  potassium chloride 20 MEQ TBCR Take 20 mEq by mouth daily for 3 days. 07/30/17 08/02/17  Lenox PondsSilva Zapata, Edwin, MD  pravastatin (PRAVACHOL) 40 MG tablet Take 1 tablet (40 mg total) by mouth every evening. 09/24/17 12/23/17  James PasseyBhagat, Jarryd Gratz, PA     Allergies:   Lisinopril   Social History   Socioeconomic History  . Marital status: Divorced    Spouse name: Not on file  . Number of children: Not on file  . Years of education: Not on file  . Highest education level: Not on file  Occupational History  . Not on file  Social Needs  . Financial resource strain: Not on file  . Food insecurity:    Worry: Not on file    Inability: Not on file  . Transportation needs:    Medical: Not on file     Non-medical: Not on file  Tobacco Use  . Smoking status: Never Smoker  . Smokeless tobacco: Never Used  Substance and Sexual Activity  . Alcohol use: Yes    Comment: 1 beer a month or less  . Drug use: No  . Sexual activity: Not on file  Lifestyle  . Physical activity:    Days per week: Not on file    Minutes per session: Not on file  . Stress: Not on file  Relationships  . Social connections:    Talks on phone: Not on file    Gets together: Not on file    Attends religious service: Not on file    Active member of club or organization: Not on file    Attends meetings of clubs or organizations: Not on file    Relationship status: Not on file  Other Topics Concern  . Not on file  Social History Narrative  . Not on file     Family  History:  The patient's family history includes Hypertension in his father, mother, and paternal grandfather; Stroke in his father.   ROS:   Please see the history of present illness.    ROS All other systems reviewed and are negative.   PHYSICAL EXAM:   VS:  BP (!) 148/94   Pulse 68   Ht 5\' 9"  (1.753 m)   Wt 244 lb 3.2 oz (110.8 kg)   SpO2 97%   BMI 36.06 kg/m    GEN: Well nourished, well developed, in no acute distress  HEENT: normal  Neck: no JVD, carotid bruits, or masses Cardiac: RRR; no murmurs, rubs, or gallops, trace left lower extremity edema with venous stasis Respiratory:  clear to auscultation bilaterally, normal work of breathing GI: soft, nontender, nondistended, + BS MS: no deformity or atrophy  Skin: warm and dry, no rash Neuro:  Alert and Oriented x 3, Strength and sensation are intact Psych: euthymic mood, full affect  Wt Readings from Last 3 Encounters:  09/24/17 244 lb 3.2 oz (110.8 kg)  07/30/17 251 lb 8.7 oz (114.1 kg)  07/24/17 248 lb (112.5 kg)      Studies/Labs Reviewed:   EKG:  EKG is ordered today.  The ekg ordered today demonstrates normal sinus rhythm with nonspecific ST/T wave  abnormality  Recent Labs: 06/18/2017: NT-Pro BNP 274; TSH 3.340 07/29/2017: ALT 47 07/30/2017: BUN 9; Creatinine, Ser 1.01; Hemoglobin 11.7; Magnesium 2.0; Platelets 356; Potassium 2.7; Sodium 138   Lipid Panel    Component Value Date/Time   CHOL 125 04/01/2016 0000   TRIG 109 04/01/2016 0000   HDL 37 (L) 04/01/2016 0000   CHOLHDL 3.4 04/01/2016 0000   CHOLHDL 5.5 (H) 11/07/2015 0824   VLDL 23 11/07/2015 0824   LDLCALC 66 04/01/2016 0000    Additional studies/ records that were reviewed today include:   Echo (06/24/17): - Procedure narrative: Transthoracic echocardiography. Image quality was adequate. The study was technically difficult, as a result of poor acoustic windows and body habitus. Intravenous contrast (Definity) was administered. - Left ventricle: The cavity size was normal. Wall thickness was increased in a pattern of mild LVH. Systolic function was mildly to moderately reduced. The estimated ejection fraction was in the range of 40% to 45%. Incoordinate septal motion and possible anteroseptal hypokinesis. The study is not technically sufficient to allow evaluation of LV diastolic function. - Left atrium: The atrium was normal in size. - Right atrium: Moderately dilated. - Inferior vena cava: The vessel was dilated. The respirophasic diameter changes were blunted (<50%), consistent with elevated central venous pressure.   Myoview (07/09/17): Intermediate risk study with anteroapical reversible defect and moderately reduced LVEF.  ASSESSMENT & PLAN:    1. Abnormal stress test - Intermediate risk study with anteroapical reversible defect and moderately reduced LVEF.  Previously cancer left and right cath due to diarrhea found to have diverticulitis.  No anginal symptoms. -We will schedule left and right cath as recommended. The patient understands that risks include but are not limited to stroke (1 in 1000), death (1 in 1000), kidney failure  [usually temporary] (1 in 500), bleeding (1 in 200), allergic reaction [possibly serious] (1 in 200), and agrees to proceed.  -Continue aspirin 81 mg and Toprol-XL 100 mg daily.  Will restart his Pravachol and Zetia.  Recheck labs during follow-up.  He already has today.  2.  Chronic systolic heart failure -LV function of 40 to 45% by echocardiogram.  Previously on torsemide however discontinued in the hospital.  Euvolemic by exam. -Lisinopril discontinued due to cough.  Discussed initiation of ARB however he wants to hold until cath. -Continue beta-blocker.  3.  Diabetes mellitus -Will hold metformin for cath.  Consider ARB given CHF and diabetes during follow-up.   4.  Hypertensive heart disease -Blood pressure normal at home however elevated today.  Continue beta-blocker.  Consider ARB as above during follow up.   Medication Adjustments/Labs and Tests Ordered: Current medicines are reviewed at length with the patient today.  Concerns regarding medicines are outlined above.  Medication changes, Labs and Tests ordered today are listed in the Patient Instructions below. Patient Instructions  Medication Instructions:  1. START PRAVACHOL 40 MG DAILY; RX HAS BEEN SENT IN 2. START ZETIA 10 MG DAILY; RX HAS BEEN SENT IN  Labwork: 1. TODAY BMET, CBC  2. FASTING LIPID AND LIVER PANEL TO BE DONE AT YOUR FOLLOW UP APPT   Testing/Procedures: Your physician has requested that you have a cardiac catheterization. Cardiac catheterization is used to diagnose and/or treat various heart conditions. Doctors may recommend this procedure for a number of different reasons. The most common reason is to evaluate chest pain. Chest pain can be a symptom of coronary artery disease (CAD), and cardiac catheterization can show whether plaque is narrowing or blocking your heart's arteries. This procedure is also used to evaluate the valves, as well as measure the blood flow and oxygen levels in different parts of your  heart. For further information please visit [email or web address] Please follow instruction sheet, as given.    Follow-Up:  10/14/17 @ 12 PM WITH MICHELE LENZE, PAC...    Any Other Special Instructions Will Be Listed Below (If Applicable).  If you need a refill on your cardiac medications before your next appointment, please call your pharmacy.     [facility] CARDIOVASCULAR DIVISION [facility] OFFICE [address] [place] [address] Dept: [phone] Loc: [phone]  Jacques Fife [facility]  09/24/2017  You are scheduled for a Cardiac Catheterization on Wednesday, August 14 with Dr. Lance Muss.  1. Please arrive at the [facility] (Main Entrance A) at [facility]: [address] [place], [place] [address] at 6:30 AM (This time is two hours before your procedure to ensure your preparation). Free valet parking service is available.   Special note: Every effort is made to have your procedure done on time. Please understand that emergencies sometimes delay scheduled procedures.  2. Diet: Do not eat solid foods after midnight.  The patient may have clear liquids until 5am upon the day of the procedure.  3. Labs: You will need to have blood drawn on Thursday, August 8 at [facility] at [facility]. [address], [place]  Open: 7:30am - 5pm    Phone: [phone]. You do not need to be fasting.  4. Medication instructions in preparation for your procedure:   Contrast Allergy: No   MAKE SURE TO NOT TAKE ANY NSAID'S, ADVIL, IBUPROFEN, MOTRIN FOR 24 HOURS BEFORE   INSULIN INSTRUCTIONS: THE NIGHT BEFORE YOU WILL NEED TO TAKE 1/2 DOSE OF YOUR NOVOLOG.  THE MORNING OF CATH 09/30/17 YOU WILL NOT TAKE ANY INSULIN BEFORE CATH   ORAL DIABETES MEDICATIONS INSTRUCTIONS: BEGINNING ON 09/30/17 YOU WILL NOT TAKE ANY METFORMIN THE MORNING OF CATH AND HOLD FOR 48 HOURS AFTER YOUR CATH  On the morning of your  procedure, take your Aspirin 81 MG and any morning medicines NOT listed above.  You may use sips of water.  5. Plan for one night stay--bring personal belongings. 6. Bring a current list of your medications and current insurance cards. 7. You MUST have a responsible person to drive you home. 8. Someone MUST be with you the first 24 hours after you arrive home or your discharge will be delayed. 9. Please wear clothes that are easy to get on and off and wear slip-on shoes.  Thank you for allowing [place] to care for you!   -- [facility] Invasive Cardiovascular services      Signed, James [name], [place]  09/24/2017 10:51 AM    [facility] [address] [place], [place], [place]  [address] Phone: [phone]; Fax: [phone]

## 2017-09-29 ENCOUNTER — Telehealth: Payer: Self-pay | Admitting: *Deleted

## 2017-09-29 MED FILL — METOPROLOL SUCCINATE ER 100: 100 | 30 days supply | Qty: 30 | Fill #7

## 2017-09-29 MED FILL — PRAMIPEXOLE 0.75 MG TABLET: 0.75 | 30 days supply | Qty: 60 | Fill #3

## 2017-09-29 NOTE — Telephone Encounter (Addendum)
[facility] for: Wednesday September 30, 2017 8:30 AM Verify arrival time and place: [facility] Main Entrance A at: 6:30 AM  No solid food after midnight prior to cath, clear liquids until 5 AM day of procedure. Verify allergies in Epic  Hold: Metformin -day of procedure and 48 hours post procedure. Insulin -AM of procedure. 1/2 Insulin PM prior to procedure. Glimepiride- AM of procedure. Torsemide-AM of procedure. KCl -AM of procedure.  Except hold medications AM meds can be  taken pre-cath with sip of water including: ASA 81 mg  Confirm patient has responsible person to drive home post procedure and for 24 hours after you arrive home.   Discussed instructions with patient, he verbalized understanding, thanked me for call.

## 2017-09-30 ENCOUNTER — Encounter (HOSPITAL_COMMUNITY): Payer: Self-pay | Admitting: Interventional Cardiology

## 2017-09-30 ENCOUNTER — Ambulatory Visit (HOSPITAL_COMMUNITY)
Admission: RE | Admit: 2017-09-30 | Discharge: 2017-10-01 | Disposition: A | Discharge status: Home / Self Care | Payer: Medicare HMO | Source: Ambulatory Visit | Attending: Interventional Cardiology | Admitting: Interventional Cardiology

## 2017-09-30 ENCOUNTER — Other Ambulatory Visit: Payer: Self-pay

## 2017-09-30 ENCOUNTER — Encounter (HOSPITAL_COMMUNITY)
Admission: RE | Disposition: A | Discharge status: Home / Self Care | Payer: Self-pay | Source: Ambulatory Visit | Attending: Interventional Cardiology

## 2017-09-30 DIAGNOSIS — E785 Hyperlipidemia, unspecified: Secondary | ICD-10-CM | POA: Insufficient documentation

## 2017-09-30 DIAGNOSIS — I25700 Atherosclerosis of coronary artery bypass graft(s), unspecified, with unstable angina pectoris: Secondary | ICD-10-CM

## 2017-09-30 DIAGNOSIS — I130 Hypertensive heart and chronic kidney disease with heart failure and stage 1 through stage 4 chronic kidney disease, or unspecified chronic kidney disease: Secondary | ICD-10-CM | POA: Diagnosis not present

## 2017-09-30 DIAGNOSIS — Z7982 Long term (current) use of aspirin: Secondary | ICD-10-CM | POA: Insufficient documentation

## 2017-09-30 DIAGNOSIS — I5042 Chronic combined systolic (congestive) and diastolic (congestive) heart failure: Secondary | ICD-10-CM | POA: Diagnosis not present

## 2017-09-30 DIAGNOSIS — Z79899 Other long term (current) drug therapy: Secondary | ICD-10-CM | POA: Insufficient documentation

## 2017-09-30 DIAGNOSIS — Z8249 Family history of ischemic heart disease and other diseases of the circulatory system: Secondary | ICD-10-CM | POA: Insufficient documentation

## 2017-09-30 DIAGNOSIS — N183 Chronic kidney disease, stage 3 unspecified: Secondary | ICD-10-CM | POA: Diagnosis present

## 2017-09-30 DIAGNOSIS — Z951 Presence of aortocoronary bypass graft: Secondary | ICD-10-CM | POA: Insufficient documentation

## 2017-09-30 DIAGNOSIS — I152 Hypertension secondary to endocrine disorders: Secondary | ICD-10-CM | POA: Diagnosis present

## 2017-09-30 DIAGNOSIS — E118 Type 2 diabetes mellitus with unspecified complications: Secondary | ICD-10-CM

## 2017-09-30 DIAGNOSIS — E1122 Type 2 diabetes mellitus with diabetic chronic kidney disease: Secondary | ICD-10-CM | POA: Insufficient documentation

## 2017-09-30 DIAGNOSIS — J342 Deviated nasal septum: Secondary | ICD-10-CM | POA: Diagnosis not present

## 2017-09-30 DIAGNOSIS — K219 Gastro-esophageal reflux disease without esophagitis: Secondary | ICD-10-CM | POA: Diagnosis not present

## 2017-09-30 DIAGNOSIS — Z823 Family history of stroke: Secondary | ICD-10-CM | POA: Insufficient documentation

## 2017-09-30 DIAGNOSIS — E1165 Type 2 diabetes mellitus with hyperglycemia: Secondary | ICD-10-CM | POA: Diagnosis present

## 2017-09-30 DIAGNOSIS — I4892 Unspecified atrial flutter: Secondary | ICD-10-CM | POA: Insufficient documentation

## 2017-09-30 DIAGNOSIS — I2510 Atherosclerotic heart disease of native coronary artery without angina pectoris: Secondary | ICD-10-CM | POA: Insufficient documentation

## 2017-09-30 DIAGNOSIS — I712 Thoracic aortic aneurysm, without rupture: Secondary | ICD-10-CM | POA: Insufficient documentation

## 2017-09-30 DIAGNOSIS — Z9889 Other specified postprocedural states: Secondary | ICD-10-CM | POA: Insufficient documentation

## 2017-09-30 DIAGNOSIS — Z794 Long term (current) use of insulin: Secondary | ICD-10-CM | POA: Diagnosis not present

## 2017-09-30 DIAGNOSIS — Z888 Allergy status to other drugs, medicaments and biological substances status: Secondary | ICD-10-CM | POA: Insufficient documentation

## 2017-09-30 DIAGNOSIS — Z955 Presence of coronary angioplasty implant and graft: Secondary | ICD-10-CM | POA: Diagnosis not present

## 2017-09-30 DIAGNOSIS — I519 Heart disease, unspecified: Secondary | ICD-10-CM

## 2017-09-30 DIAGNOSIS — E7800 Pure hypercholesterolemia, unspecified: Secondary | ICD-10-CM

## 2017-09-30 DIAGNOSIS — IMO0002 Reserved for concepts with insufficient information to code with codable children: Secondary | ICD-10-CM | POA: Diagnosis present

## 2017-09-30 DIAGNOSIS — I10 Essential (primary) hypertension: Secondary | ICD-10-CM | POA: Diagnosis present

## 2017-09-30 HISTORY — PX: RIGHT/LEFT HEART CATH AND CORONARY/GRAFT ANGIOGRAPHY: CATH118267

## 2017-09-30 HISTORY — DX: Unspecified osteoarthritis, unspecified site: M19.90

## 2017-09-30 HISTORY — PX: CORONARY STENT INTERVENTION: CATH118234

## 2017-09-30 LAB — POCT I-STAT 3, VENOUS BLOOD GAS (G3P V)
ACID-BASE EXCESS: 1 mmol/L (ref 0.0–2.0)
Bicarbonate: 26.1 mmol/L (ref 20.0–28.0)
Bicarbonate: 27.6 mmol/L (ref 20.0–28.0)
O2 SAT: 75 %
O2 Saturation: 73 %
PO2 VEN: 42 mmHg (ref 32.0–45.0)
TCO2: 27 mmol/L (ref 22–32)
TCO2: 29 mmol/L (ref 22–32)
pCO2, Ven: 46.9 mmHg (ref 44.0–60.0)
pCO2, Ven: 49.5 mmHg (ref 44.0–60.0)
pH, Ven: 7.354 (ref 7.250–7.430)
pH, Ven: 7.354 (ref 7.250–7.430)
pO2, Ven: 41 mmHg (ref 32.0–45.0)

## 2017-09-30 LAB — GLUCOSE, CAPILLARY
Glucose-Capillary: 162 mg/dL — ABNORMAL HIGH (ref 70–99)
Glucose-Capillary: 144 mg/dL — ABNORMAL HIGH (ref 70–99)
Glucose-Capillary: 180 mg/dL — ABNORMAL HIGH (ref 70–99)
Glucose-Capillary: 215 mg/dL — ABNORMAL HIGH (ref 70–99)

## 2017-09-30 LAB — POCT I-STAT 3, ART BLOOD GAS (G3+)
BICARBONATE: 25.8 mmol/L (ref 20.0–28.0)
O2 Saturation: 98 %
TCO2: 27 mmol/L (ref 22–32)
pCO2 arterial: 43.8 mmHg (ref 32.0–48.0)
pH, Arterial: 7.378 (ref 7.350–7.450)
pO2, Arterial: 111 mmHg — ABNORMAL HIGH (ref 83.0–108.0)

## 2017-09-30 LAB — POCT ACTIVATED CLOTTING TIME
ACTIVATED CLOTTING TIME: 246 s
ACTIVATED CLOTTING TIME: 329 s
Activated Clotting Time: 268 seconds

## 2017-09-30 SURGERY — RIGHT/LEFT HEART CATH AND CORONARY/GRAFT ANGIOGRAPHY
Anesthesia: LOCAL

## 2017-09-30 MED ORDER — MIDAZOLAM HCL 2 MG/2ML IJ SOLN
INTRAMUSCULAR | Status: AC
  Filled 2017-09-30: qty 2

## 2017-09-30 MED ORDER — METOPROLOL SUCCINATE ER 25 MG PO TB24
125.0000 mg | ORAL_TABLET | Freq: Every day | ORAL | Status: DC
  Administered 2017-10-01: 11:00:00 125 mg via ORAL
  Filled 2017-09-30: qty 1

## 2017-09-30 MED ORDER — FENTANYL CITRATE (PF) 100 MCG/2ML IJ SOLN
INTRAMUSCULAR | Status: AC
  Filled 2017-09-30: qty 2

## 2017-09-30 MED ORDER — HYDRALAZINE HCL 20 MG/ML IJ SOLN
INTRAMUSCULAR | Status: DC | PRN
  Administered 2017-09-30 (×2): 10 mg via INTRAVENOUS

## 2017-09-30 MED ORDER — ONDANSETRON HCL 4 MG/2ML IJ SOLN
4.0000 mg | Freq: Four times a day (QID) | INTRAMUSCULAR | Status: DC | PRN
  Administered 2017-09-30: 14:00:00 4 mg via INTRAVENOUS
  Filled 2017-09-30: qty 2

## 2017-09-30 MED ORDER — IOPAMIDOL (ISOVUE-370) INJECTION 76%
INTRAVENOUS | Status: AC
  Filled 2017-09-30: qty 125

## 2017-09-30 MED ORDER — PRAMIPEXOLE DIHYDROCHLORIDE 1.5 MG PO TABS
1.5000 mg | ORAL_TABLET | Freq: Every evening | ORAL | Status: DC
  Filled 2017-09-30: qty 1

## 2017-09-30 MED ORDER — SODIUM CHLORIDE 0.9 % IV SOLN: 1.7500 mg/kg/h | INTRAVENOUS | Status: DC

## 2017-09-30 MED ORDER — SODIUM CHLORIDE 0.9% FLUSH: 3.0000 mL | Freq: Two times a day (BID) | INTRAVENOUS | Status: DC

## 2017-09-30 MED ORDER — SODIUM CHLORIDE 0.9 % IV SOLN
INTRAVENOUS | Status: AC | PRN
  Administered 2017-09-30: 10 mL/h via INTRAVENOUS

## 2017-09-30 MED ORDER — INSULIN ASPART 100 UNIT/ML ~~LOC~~ SOLN
25.0000 [IU] | Freq: Two times a day (BID) | SUBCUTANEOUS | Status: DC
  Administered 2017-09-30 – 2017-10-01 (×2): 25 [IU] via SUBCUTANEOUS

## 2017-09-30 MED ORDER — ISOSORBIDE MONONITRATE ER 60 MG PO TB24
60.0000 mg | ORAL_TABLET | Freq: Every day | ORAL | Status: DC
  Administered 2017-09-30 – 2017-10-01 (×2): 60 mg via ORAL
  Filled 2017-09-30 (×2): qty 1

## 2017-09-30 MED ORDER — OMEGA 3 1200 MG PO CAPS: 2400.0000 mg | ORAL_CAPSULE | Freq: Every day | ORAL | Status: DC

## 2017-09-30 MED ORDER — GLIMEPIRIDE 4 MG PO TABS
4.0000 mg | ORAL_TABLET | Freq: Every day | ORAL | Status: DC
  Administered 2017-10-01: 08:00:00 4 mg via ORAL
  Filled 2017-09-30: qty 1

## 2017-09-30 MED ORDER — BIVALIRUDIN BOLUS VIA INFUSION - CUPID
INTRAVENOUS | Status: DC | PRN
  Administered 2017-09-30: 83.025 mg via INTRAVENOUS

## 2017-09-30 MED ORDER — HYDRALAZINE HCL 20 MG/ML IJ SOLN
INTRAMUSCULAR | Status: AC
  Filled 2017-09-30: qty 1

## 2017-09-30 MED ORDER — MIDAZOLAM HCL 2 MG/2ML IJ SOLN
INTRAMUSCULAR | Status: DC | PRN
  Administered 2017-09-30: 1 mg via INTRAVENOUS
  Administered 2017-09-30: 2 mg via INTRAVENOUS
  Administered 2017-09-30: 1 mg via INTRAVENOUS

## 2017-09-30 MED ORDER — ENSURE ENLIVE PO LIQD
237.0000 mL | Freq: Two times a day (BID) | ORAL | Status: DC
  Filled 2017-09-30 (×2): qty 237

## 2017-09-30 MED ORDER — HEPARIN (PORCINE) IN NACL 1000-0.9 UT/500ML-% IV SOLN
INTRAVENOUS | Status: AC
  Filled 2017-09-30: qty 500

## 2017-09-30 MED ORDER — TICAGRELOR 90 MG PO TABS
ORAL_TABLET | ORAL | Status: AC
  Filled 2017-09-30: qty 2

## 2017-09-30 MED ORDER — OMEGA-3-ACID ETHYL ESTERS 1 G PO CAPS
1.0000 g | ORAL_CAPSULE | Freq: Every day | ORAL | Status: DC
  Administered 2017-10-01: 1 g via ORAL
  Filled 2017-09-30 (×2): qty 1

## 2017-09-30 MED ORDER — PRAVASTATIN SODIUM 40 MG PO TABS
40.0000 mg | ORAL_TABLET | Freq: Every evening | ORAL | Status: DC
  Administered 2017-09-30: 20:00:00 40 mg via ORAL
  Filled 2017-09-30: qty 1

## 2017-09-30 MED ORDER — IOPAMIDOL (ISOVUE-370) INJECTION 76%
INTRAVENOUS | Status: AC
  Filled 2017-09-30: qty 100

## 2017-09-30 MED ORDER — LIDOCAINE HCL (PF) 1 % IJ SOLN
INTRAMUSCULAR | Status: DC | PRN
  Administered 2017-09-30: 15 mL via INTRADERMAL

## 2017-09-30 MED ORDER — SODIUM CHLORIDE 0.9 % IV SOLN
INTRAVENOUS | Status: DC
  Administered 2017-09-30: 07:00:00 via INTRAVENOUS

## 2017-09-30 MED ORDER — HEPARIN (PORCINE) IN NACL 1000-0.9 UT/500ML-% IV SOLN
INTRAVENOUS | Status: DC | PRN
  Administered 2017-09-30 (×3): 500 mL

## 2017-09-30 MED ORDER — TICAGRELOR 90 MG PO TABS
ORAL_TABLET | ORAL | Status: DC | PRN
  Administered 2017-09-30: 180 mg via ORAL

## 2017-09-30 MED ORDER — ADULT MULTIVITAMIN W/MINERALS CH
1.0000 | ORAL_TABLET | Freq: Every day | ORAL | Status: DC
  Administered 2017-10-01: 1 via ORAL
  Filled 2017-09-30 (×2): qty 1

## 2017-09-30 MED ORDER — EZETIMIBE 10 MG PO TABS
10.0000 mg | ORAL_TABLET | Freq: Every day | ORAL | Status: DC
  Administered 2017-09-30 – 2017-10-01 (×2): 10 mg via ORAL
  Filled 2017-09-30 (×2): qty 1

## 2017-09-30 MED ORDER — BIVALIRUDIN TRIFLUOROACETATE 250 MG IV SOLR
INTRAVENOUS | Status: AC
  Filled 2017-09-30: qty 250

## 2017-09-30 MED ORDER — ASPIRIN 81 MG PO TABS: 81.0000 mg | ORAL_TABLET | Freq: Every day | ORAL | Status: DC

## 2017-09-30 MED ORDER — VERAPAMIL HCL 2.5 MG/ML IV SOLN
INTRAVENOUS | Status: AC
  Filled 2017-09-30: qty 2

## 2017-09-30 MED ORDER — TORSEMIDE 20 MG PO TABS
40.0000 mg | ORAL_TABLET | Freq: Every day | ORAL | Status: DC
Start: 2017-09-30 — End: 2017-10-01
  Administered 2017-10-01: 08:00:00 40 mg via ORAL
  Filled 2017-09-30: qty 2

## 2017-09-30 MED ORDER — LIDOCAINE HCL (PF) 1 % IJ SOLN
INTRAMUSCULAR | Status: AC
  Filled 2017-09-30: qty 30

## 2017-09-30 MED ORDER — IOPAMIDOL (ISOVUE-370) INJECTION 76%
INTRAVENOUS | Status: DC | PRN
  Administered 2017-09-30: 160 mL via INTRA_ARTERIAL

## 2017-09-30 MED ORDER — TICAGRELOR 90 MG PO TABS
90.0000 mg | ORAL_TABLET | Freq: Two times a day (BID) | ORAL | Status: DC
  Administered 2017-09-30 – 2017-10-01 (×2): 90 mg via ORAL
  Filled 2017-09-30 (×2): qty 1

## 2017-09-30 MED ORDER — POLYETHYLENE GLYCOL 3350 17 G PO PACK
17.0000 g | PACK | Freq: Every day | ORAL | Status: DC
  Filled 2017-09-30: qty 1

## 2017-09-30 MED ORDER — ANGIOPLASTY BOOK
Freq: Once | Status: AC
  Administered 2017-09-30: 1
  Filled 2017-09-30: qty 1

## 2017-09-30 MED ORDER — HYDRALAZINE HCL 20 MG/ML IJ SOLN: 5.0000 mg | INTRAMUSCULAR | Status: DC | PRN

## 2017-09-30 MED ORDER — SODIUM CHLORIDE 0.9 % IV SOLN: 250.0000 mL | INTRAVENOUS | Status: DC | PRN

## 2017-09-30 MED ORDER — SODIUM CHLORIDE 0.9% FLUSH: 3.0000 mL | INTRAVENOUS | Status: DC | PRN

## 2017-09-30 MED ORDER — SODIUM CHLORIDE 0.9 % IV SOLN: INTRAVENOUS | Status: DC

## 2017-09-30 MED ORDER — SODIUM CHLORIDE 0.9 % IV SOLN
INTRAVENOUS | Status: DC | PRN
  Administered 2017-09-30 (×2): 1.75 mg/kg/h via INTRAVENOUS

## 2017-09-30 MED ORDER — PRAMIPEXOLE DIHYDROCHLORIDE 0.125 MG PO TABS
0.7500 mg | ORAL_TABLET | ORAL | Status: DC
  Administered 2017-09-30: 20:00:00 0.75 mg via ORAL
  Filled 2017-09-30: qty 6
  Filled 2017-09-30: qty 3
  Filled 2017-09-30: qty 6

## 2017-09-30 MED ORDER — FENTANYL CITRATE (PF) 100 MCG/2ML IJ SOLN
INTRAMUSCULAR | Status: DC | PRN
  Administered 2017-09-30 (×3): 25 ug via INTRAVENOUS

## 2017-09-30 MED ORDER — PRAMIPEXOLE DIHYDROCHLORIDE 0.25 MG PO TABS
0.7500 mg | ORAL_TABLET | Freq: Two times a day (BID) | ORAL | Status: DC
  Filled 2017-09-30: qty 3

## 2017-09-30 MED ORDER — ASPIRIN 81 MG PO CHEW
81.0000 mg | CHEWABLE_TABLET | Freq: Every day | ORAL | Status: DC
  Administered 2017-10-01: 81 mg via ORAL
  Filled 2017-09-30: qty 1

## 2017-09-30 MED ORDER — LABETALOL HCL 5 MG/ML IV SOLN: 10.0000 mg | INTRAVENOUS | Status: DC | PRN

## 2017-09-30 MED ORDER — ASPIRIN 81 MG PO CHEW: 81.0000 mg | CHEWABLE_TABLET | ORAL | Status: DC

## 2017-09-30 MED ORDER — ACETAMINOPHEN 325 MG PO TABS: 650.0000 mg | ORAL_TABLET | ORAL | Status: DC | PRN

## 2017-09-30 SURGICAL SUPPLY — 28 items
BALLN EMERGE MR 3.5X15 (BALLOONS) ×2
BALLN SAPPHIRE 2.5X15 (BALLOONS) ×2
BALLN SAPPHIRE ~~LOC~~ 3.25X15 (BALLOONS) ×2 IMPLANT
BALLN ~~LOC~~ EMERGE MR 4.0X8 (BALLOONS) ×2
BALLOON EMERGE MR 3.5X15 (BALLOONS) ×1 IMPLANT
BALLOON SAPPHIRE 2.5X15 (BALLOONS) ×1 IMPLANT
BALLOON ~~LOC~~ EMERGE MR 4.0X8 (BALLOONS) ×1 IMPLANT
CATH INFINITI 5 FR IM (CATHETERS) ×2 IMPLANT
CATH INFINITI 5FR MULTPACK ANG (CATHETERS) ×2 IMPLANT
CATH INFINITI 6F AL1 (CATHETERS) ×2 IMPLANT
CATH SWAN GANZ 7F STRAIGHT (CATHETERS) ×2 IMPLANT
CATH VISTA GUIDE 6FR JR4 SH (CATHETERS) ×2 IMPLANT
CATHETER LAUNCHER 6FR JR4 SH (CATHETERS) ×2 IMPLANT
KIT ENCORE 26 ADVANTAGE (KITS) ×2 IMPLANT
KIT HEART LEFT (KITS) ×2 IMPLANT
KIT HEMO VALVE WATCHDOG (MISCELLANEOUS) ×2 IMPLANT
PACK CARDIAC CATHETERIZATION (CUSTOM PROCEDURE TRAY) ×2 IMPLANT
SHEATH PINNACLE 5F 10CM (SHEATH) ×2 IMPLANT
SHEATH PINNACLE 6F 10CM (SHEATH) ×2 IMPLANT
SHEATH PINNACLE 7F 10CM (SHEATH) ×2 IMPLANT
SHEATH PROBE COVER 6X72 (BAG) ×2 IMPLANT
STENT SYNERGY DES 2.75X38 (Permanent Stent) ×2 IMPLANT
STENT SYNERGY DES 3.5X16 (Permanent Stent) ×2 IMPLANT
TRANSDUCER W/STOPCOCK (MISCELLANEOUS) ×2 IMPLANT
TUBING CIL FLEX 10 FLL-RA (TUBING) ×2 IMPLANT
WIRE ASAHI PROWATER 180CM (WIRE) ×2 IMPLANT
WIRE EMERALD 3MM-J .035X150CM (WIRE) ×2 IMPLANT
WIRE HI TORQ BMW 190CM (WIRE) ×2 IMPLANT

## 2017-09-30 NOTE — Discharge Summary (Addendum)
Discharge Summary    Patient ID: James [name],  MRN: [id number], DOB/AGE: [birth year] 72 y.o.  Admit date: 09/30/2017 Discharge date: 10/01/2017  Primary Care Provider: Joycelyn Rua Primary Cardiologist: Armanda Magic, MD  Discharge Diagnoses    Principal Problem:   Coronary artery disease Active Problems:   Hypertension   Hyperlipidemia   DM (diabetes mellitus), type 2, uncontrolled with complications (HCC)   CKD (chronic kidney disease), stage III (HCC)   Left ventricular systolic dysfunction   S/P CABG (coronary artery bypass graft)   Allergies Allergies  Allergen Reactions  . Lisinopril Cough    Diagnostic Studies/Procedures    Right and left heart catheterization 09/30/17:  Ost RCA to Prox RCA lesion is 90% stenosed.  A drug-eluting stent was successfully placed using a STENT SYNERGY DES [id number].  Post intervention, there is a 0% residual stenosis.  Dist RCA lesion is 80% stenosed.  Post intervention, there is a 0% residual stenosis.  A drug-eluting stent was successfully placed using a STENT SYNERGY DES [id number].  Ost RPDA to RPDA lesion is 100% stenosed. SVG to PDA is patent.  Ost LAD to Prox LAD lesion is 99% stenosed. LIMA to LAD is patent and fills a diagonal retrograde.  Ost 2nd Mrg to 2nd Mrg lesion is 95% stenosed. Prox Cx to Mid Cx lesion is 80% stenosed. SVG to OM is patent.  Origin to Prox Graft lesion is 100% stenosed.  LV end diastolic pressure is normal.  The left ventricular ejection fraction is 35-45% by visual estimate.  There is mild to moderate left ventricular systolic dysfunction.  There is no aortic valve stenosis.  Hemodynamic findings consistent with mild pulmonary hypertension.  CO 8.1 L/min; CI 3.6, PA pressure 40/20; PCWP mean 8 mm Hg. Ao sat 98%; PA 74%   Recommend uninterrupted dual antiplatelet therapy with Aspirin 81mg  daily and Ticagrelor 90mg  twice daily for a minimum of 6 months (stable ischemic heart disease -  Class I recommendation).   OK to switch to Plavix if there are cost issues or bleeding problems.    Would consider longterm clopidogrel therapy given the extent of his disease.  _____________   History of Present Illness     72 y.o. male with a history of coronary artery diseases/p CABG, diastoliccongestive heart failure,diabetes,hypertension,andhyperlipidemia presents for cath discussion.   Recently echo demonstrated moderately reduced LV function (EF 40-45% during admission sepsis 2nd to LE cellulitis. Outpatient myocardial perfusion stress test showed anteroapical ischemia. He was scheduled for R & L cath however complained diarrhea and stomach pain at day of cath leading to cancellation. Sent to ER and admitted for acute sigmoid diverticulitis. Treated with broad spectrum antibiotics.   He presented outpatient on 09/24/17 for cath discussion.  His lisinopril and statin were discontinued during previous admission.  He denied chest pain, shortness of breath, orthopnea,, syncope or lower extremity edema.  Having vision issue but has upcoming cataract surgery. Blood pressure elevated however runs normal at home in 120-130s/70-80s.  Compliant with low-sodium diet.  Hospital Course     Consultants: None   1. CAD s/p CABG: patient presented for [facility] 09/30/17 to evaluate an abnormal NST which revealed proximal and distal RCA stenosis, both managed with PCI/DES with patent LIMA to LAD, SVG to OM, and SVG to PDA. EF was estimated to be 35-45%. He was recommended for DAPT with ASA and brilinta for a minimum of 6 months with consideration for long-term plavix therapy given extent of disease. - Continue aspirin  and brilinta for a minimum of 6 months - Continue statin, zetia, imdur, metoprolol   2. Chronic systolic CHF: EF 14-78% on R/LHC this admission. Patient has allergy to lisinopril but would benefit from ARB therapy. Unfortunately Cr elevated above baseline limiting ability to start  losartan inpatient. He appeared euvolemic on exam  - Continue home metoprolol - Consider starting losartan outpatient if Cr and BP will allow at next follow-up visit  3. HTN: BP stable throughout admission.  - Continue metoprolol XL - Consider starting losartan outpatient if Cr and BP will allow at next follow-up visit  4. DM type 2: last A1C was 8.4 07/2017; goal <7 - Home medications resumed at discharge  5. HLD: no recent lipids. Transitioned to high intensity statin - Continue statin and zetia _____________  Discharge Vitals Blood pressure 137/61, pulse 84, temperature 98.2 F (36.8 C), temperature source Oral, resp. rate (!) 21, height 5\' 9"  (1.753 m), weight 107.2 kg, SpO2 94 %.  Filed Weights   09/30/17 0619 10/01/17 0230  Weight: 110.7 kg 107.2 kg    Labs & Radiologic Studies    CBC Recent Labs    10/01/17 0350  WBC 10.6*  HGB 11.1*  HCT 35.1*  MCV 85.8  PLT 293   Basic Metabolic Panel Recent Labs    29/56/21 0350  NA 137  K 4.0  CL 101  CO2 26  GLUCOSE 124*  BUN 18  CREATININE 1.53*  CALCIUM 8.5*   Liver Function Tests No results for input(s): AST, ALT, ALKPHOS, BILITOT, PROT, ALBUMIN in the last 72 hours. No results for input(s): LIPASE, AMYLASE in the last 72 hours. Cardiac Enzymes No results for input(s): CKTOTAL, CKMB, CKMBINDEX, TROPONINI in the last 72 hours. BNP Invalid input(s): POCBNP D-Dimer No results for input(s): DDIMER in the last 72 hours. Hemoglobin A1C No results for input(s): HGBA1C in the last 72 hours. Fasting Lipid Panel No results for input(s): CHOL, HDL, LDLCALC, TRIG, CHOLHDL, LDLDIRECT in the last 72 hours. Thyroid Function Tests No results for input(s): TSH, T4TOTAL, T3FREE, THYROIDAB in the last 72 hours.  Invalid input(s): FREET3 _____________  Ct [place] Contrast  Result Date: 09/10/2017 CLINICAL DATA:  72 year old male with proptosis for 2 weeks. EXAM: CT ORBITS WITH CONTRAST TECHNIQUE: Multidetector CT images  was performed according to the standard protocol following intravenous contrast administration. CONTRAST:  75mL ISOVUE-300 IOPAMIDOL (ISOVUE-300) INJECTION 61% COMPARISON:  None. FINDINGS: Orbits: Intact orbital walls. Normal CT appearance of the pituitary, suprasellar cistern and optic chiasm. Symmetric appearing retro-bulbar orbital soft tissues with no intraorbital mass or convincing inflammation. Symmetric optic nerves and globes. Extraocular muscles appear symmetric and within normal limits. No preseptal orbital inflammation or mass is evident. Bones: Carious maxillary dentition. Skull base and other visible facial bones appear intact. Visualized sinuses: Moderate left maxillary sinus mucosal thickening with mild generalized sinus mucosal thickening elsewhere. No sinus fluid levels. Leftward nasal septal deviation. Symmetric appearing nasal cavity mucosal thickening. Bilateral tympanic cavities and visible mastoids are clear. Soft tissues: Negative visible nasopharynx, bilateral masticator spaces. Visualized scalp soft tissues are within normal limits. Limited intracranial: Calcified ICA siphon atherosclerosis. The major vascular structures at the skull base including the cavernous sinus appear patent. Visible brain parenchyma is within normal limits for age. IMPRESSION: 1. Negative CT appearance of the orbits. No explanation for proptosis. 2. Moderate left maxillary sinus mucosal thickening. Symmetric nasal cavity mucosal thickening raising the possibility of rhinitis. 3. Carious maxillary dentition. Electronically Signed   By: Odessa Fleming  M.D.   On: 09/10/2017 14:09   Disposition   Patient was seen and examined by Dr. Royann Shiversroitoru who deemed patient as stable for discharge. Follow-up has been arranged. Discharge medications as listed below.   Follow-up Plans & Appointments    Follow-up Information    Dyann KiefLenze, Michele M, PA-C Follow up on 10/13/2017.   Specialty:  Cardiology Why:  Please arrive 15 minutes  early for your 8:00am post hospital cardiology appointment Contact information: [address] [place] [address] [phone]          Discharge Instructions    Amb Referral to Cardiac Rehabilitation   Complete by:  As directed    Diagnosis:   Coronary Stents PTCA        Discharge Medications   Allergies as of 10/01/2017      Reactions   Lisinopril Cough      Medication List    STOP taking these medications   pravastatin 40 MG tablet Commonly known as:  PRAVACHOL     TAKE these medications   acetaminophen 500 MG tablet Commonly known as:  TYLENOL Take 1,000 mg by mouth every 6 (six) hours as needed for headache.   aspirin 81 MG tablet Take 81 mg by mouth at bedtime.   atorvastatin 80 MG tablet Commonly known as:  LIPITOR Take 1 tablet (80 mg total) by mouth daily.   cholecalciferol 1000 units tablet Commonly known as:  VITAMIN D Take 1,000 Units by mouth daily.   ezetimibe 10 MG tablet Commonly known as:  ZETIA Take 1 tablet (10 mg total) by mouth daily.   glimepiride 4 MG tablet Commonly known as:  AMARYL Take 4 mg by mouth daily with breakfast.   insulin lispro 100 UNIT/ML injection Commonly known as:  HUMALOG Inject 25 Units into the skin 2 (two) times daily.   iron polysaccharides 150 MG capsule Commonly known as:  NIFEREX Take 1 capsule (150 mg total) by mouth daily.   isosorbide mononitrate 60 MG 24 hr tablet Commonly known as:  IMDUR Take 60 mg by mouth daily.   metFORMIN 1000 MG tablet Commonly known as:  GLUCOPHAGE Take 1 tablet (1,000 mg total) by mouth 2 (two) times daily with a meal.   metoprolol succinate 100 MG 24 hr tablet Commonly known as:  TOPROL-XL Take 125 mg by mouth daily.   multivitamin with minerals Tabs tablet Take 1 tablet by mouth daily.   nitroGLYCERIN 0.4 MG SL tablet Commonly known as:  NITROSTAT Place 1 tablet (0.4 mg total) under the tongue every 5 (five) minutes as needed for chest  pain.   Omega 3 1200 MG Caps Take 2,400 mg by mouth daily.   polyethylene glycol packet Commonly known as:  MIRALAX / GLYCOLAX Take 17 g by mouth daily.   Potassium Chloride ER 20 MEQ Tbcr Take 20 mEq by mouth daily for 3 days.   pramipexole 0.75 MG tablet Commonly known as:  MIRAPEX Take 1.5 mg by mouth every evening.   ticagrelor 90 MG Tabs tablet Commonly known as:  BRILINTA Take 1 tablet (90 mg total) by mouth 2 (two) times daily.   ticagrelor 90 MG Tabs tablet Commonly known as:  BRILINTA Take 1 tablet (90 mg total) by mouth 2 (two) times daily.   torsemide 20 MG tablet Commonly known as:  DEMADEX Take 40 mg by mouth daily.        Aspirin prescribed at discharge?  Yes High Intensity Statin Prescribed? (Lipitor 40-80mg  or Crestor  20-40mg ): Yes Beta Blocker Prescribed? Yes For EF <40%, was ACEI/ARB Prescribed? No: Cr elevated above baseline ADP Receptor Inhibitor Prescribed? (i.e. Plavix etc.-Includes Medically Managed Patients): Yes For EF <40%, Aldosterone Inhibitor Prescribed? No: EF >40% Was EF assessed during THIS hospitalization? Yes Was Cardiac Rehab II ordered? (Included Medically managed Patients): Yes   Outstanding Labs/Studies   Check BMET at next follow-up visit  Duration of Discharge Encounter   Greater than 30 minutes including physician time.  Signed, Beatriz StallionKrista M. Kroeger PA-C 10/01/2017, 9:29 AM  I have seen and examined the patient along with Beatriz StallionKrista M. Kroeger PA-C.  I have reviewed the chart, notes and new data.  I agree with PA/NP's note.  Key new complaints: He already feels substantial improvement following his stent placement.  No groin complications. Key examination changes: No groin hematoma or bleeding. General: Alert, oriented x3, no distress, obese Head: no evidence of trauma, PERRL, EOMI, no exophtalmos or lid lag, no myxedema, no xanthelasma; normal ears, nose and oropharynx Neck: normal jugular venous pulsations and no  hepatojugular reflux; brisk carotid pulses without delay and no carotid bruits Chest: clear to auscultation, no signs of consolidation by percussion or palpation, normal fremitus, symmetrical and full respiratory excursions Cardiovascular: normal position and quality of the apical impulse, regular rhythm, normal first and second heart sounds, no murmurs, rubs or gallops Abdomen: no tenderness or distention, no masses by palpation, no abnormal pulsatility or arterial bruits, normal bowel sounds, no hepatosplenomegaly Extremities: no clubbing, cyanosis or edema; 2+ radial, ulnar and brachial pulses bilaterally; 2+ right femoral, posterior tibial and dorsalis pedis pulses; 2+ left femoral, posterior tibial and dorsalis pedis pulses; no subclavian or femoral bruits Neurological: grossly nonfocal Psych: Normal mood and affect  Key new findings / data: Reviewed coronary angiogram results with the patient.  Mildly elevated creatinine  PLAN: Discussed mandatory dual antiplatelet therapy for the next 6 months, preferably 12 months. Seen by cardiac rehab. Increase intensity of statin therapy.  Even though his LDL cholesterol last checked seem to be in target range, he has had progression of disease despite statin therapy. Scheduled for early office follow-up.  Thurmon FairMihai Garnell Begeman, MD, [facility] Campus[facility]ACC [facility] [phone] 10/01/2017, 11:18 AM

## 2017-09-30 NOTE — Interval H&P Note (Signed)
Cath Lab Visit (complete for each Cath Lab visit)  Clinical Evaluation Leading to the Procedure:   ACS: Yes.   Prior ACS. Cath delayed due to diverticulitis.  Non-ACS:    Anginal Classification: CCS IV  Anti-ischemic medical therapy: Minimal Therapy (1 class of medications)  Non-Invasive Test Results: Intermediate-risk stress test findings: cardiac mortality 1-3%/year  Prior CABG: Previous CABG      History and Physical Interval Note:  09/30/2017 8:22 AM  James [name]  has presented today for surgery, with the diagnosis of AST  The various methods of treatment have been discussed with the patient and family. After consideration of risks, benefits and other options for treatment, the patient has consented to  Procedure(s): RIGHT/LEFT HEART CATH AND CORONARY/GRAFT ANGIOGRAPHY (N/A) as a surgical intervention .  The patient's history has been reviewed, patient examined, no change in status, stable for surgery.  I have reviewed the patient's chart and labs.  Questions were answered to the patient's satisfaction.     Lance MussJayadeep Ketra Duchesne

## 2017-09-30 NOTE — Progress Notes (Signed)
Site area: right groin  Site Prior to Removal:  Level 0  Pressure Applied For 22 MINUTES    Minutes Beginning at 1353  Manual:   Yes.    Patient Status During Pull:  Stable during sheath pull  Post Pull Groin Site:  Level 0  Post Pull Instructions Given:  Yes.    Post Pull Pulses Present:  Yes.    Dressing Applied:  Yes.    Comments:  At 1415, site stable and dressing applied. Patients blood pressure was stable, now is 81/41and heart rate 77 sinus rhythm without change. Site stable area around site and hip softly palpated. No discomfort in groin hip or flank area. Patient laid flat with legs elevated. Complained of nausea and given zofran 4mg .  1420 complained of feeling cold and having chills, noted slight shaking. Given 150 ml bolus of NS and watched for s/s of heart failure, none noted. Called Laverda PageLindsay Roberts NP and completed 12 lead ECG as ordered. No noted changes when compared to post PCI 12 lead. Laverda PageLindsay Roberts into see patient and evaluate 12 lead. Patient feeling warmer, no nausea, pain or shortness of breath. Feeling tired and complains of having no appetite. Blood pressure elevated and stable. 1435 Patient sitting up at 30 degrees with blood pressure 119/51. Heart rate 72 sinus rhythm. Patient resting quietly. Encouraged to sleep. Monitored closely for blood pressure and bleeding signs.

## 2017-09-30 NOTE — Care Management (Signed)
09-30-17  BENEFITS CHECK :  # 4.   S/W TICHINA  @ [employer]  # [phone]  BRILINTA  90 MG BID  COVER- YES  CO-PAY- $ 98.33 TIER- 3 DRUG PRIOR APPROVAL- NO  NO DEDUCTIBLE AND  STAGE THREE COVERAGE  GAP  PREFERRED PHARMACY : YES   [facility]

## 2017-09-30 NOTE — Care Management Note (Signed)
Case Management Note  Patient Details  Name: James [name] MRN: [id number] Date of Birth: [birth year]  Subjective/Objective:   From home, s/p stent intervention , will be on brilinta,  Co pay is high at 98.33, RN Vernona RiegerLaura will give patient the 30 day savings co pay card for brilinta,  MD will discuss on follow up apt regarding changing to different medication. [facility].                 Action/Plan: DC home when ready.   Expected Discharge Date:                  Expected Discharge Plan:  Home/Self Care  In-House Referral:     Discharge planning Services  CM Consult, Medication Assistance  Post Acute Care Choice:    Choice offered to:     DME Arranged:    DME Agency:     HH Arranged:    HH Agency:     Status of Service:  Completed, signed off  If discussed at [employer] Length of Stay Meetings, dates discussed:    Additional Comments:  Leone Havenaylor, Brick Ketcher [place], RN 09/30/2017, 3:10 PM

## 2017-10-01 DIAGNOSIS — Z951 Presence of aortocoronary bypass graft: Secondary | ICD-10-CM | POA: Diagnosis not present

## 2017-10-01 DIAGNOSIS — I10 Essential (primary) hypertension: Secondary | ICD-10-CM | POA: Diagnosis not present

## 2017-10-01 DIAGNOSIS — E1165 Type 2 diabetes mellitus with hyperglycemia: Secondary | ICD-10-CM | POA: Diagnosis not present

## 2017-10-01 DIAGNOSIS — E1122 Type 2 diabetes mellitus with diabetic chronic kidney disease: Secondary | ICD-10-CM | POA: Diagnosis not present

## 2017-10-01 DIAGNOSIS — I130 Hypertensive heart and chronic kidney disease with heart failure and stage 1 through stage 4 chronic kidney disease, or unspecified chronic kidney disease: Secondary | ICD-10-CM | POA: Diagnosis not present

## 2017-10-01 DIAGNOSIS — I712 Thoracic aortic aneurysm, without rupture: Secondary | ICD-10-CM | POA: Diagnosis not present

## 2017-10-01 DIAGNOSIS — I5042 Chronic combined systolic (congestive) and diastolic (congestive) heart failure: Secondary | ICD-10-CM | POA: Diagnosis not present

## 2017-10-01 DIAGNOSIS — N183 Chronic kidney disease, stage 3 (moderate): Secondary | ICD-10-CM

## 2017-10-01 DIAGNOSIS — J342 Deviated nasal septum: Secondary | ICD-10-CM | POA: Diagnosis not present

## 2017-10-01 DIAGNOSIS — E785 Hyperlipidemia, unspecified: Secondary | ICD-10-CM | POA: Diagnosis not present

## 2017-10-01 DIAGNOSIS — I4892 Unspecified atrial flutter: Secondary | ICD-10-CM | POA: Diagnosis not present

## 2017-10-01 DIAGNOSIS — E118 Type 2 diabetes mellitus with unspecified complications: Secondary | ICD-10-CM | POA: Diagnosis not present

## 2017-10-01 DIAGNOSIS — Z955 Presence of coronary angioplasty implant and graft: Secondary | ICD-10-CM

## 2017-10-01 DIAGNOSIS — I2510 Atherosclerotic heart disease of native coronary artery without angina pectoris: Secondary | ICD-10-CM | POA: Diagnosis not present

## 2017-10-01 DIAGNOSIS — I519 Heart disease, unspecified: Secondary | ICD-10-CM | POA: Diagnosis not present

## 2017-10-01 LAB — CBC
HCT: 35.1 % — ABNORMAL LOW (ref 39.0–52.0)
HEMOGLOBIN: 11.1 g/dL — AB (ref 13.0–17.0)
MCH: 27.1 pg (ref 26.0–34.0)
MCHC: 31.6 g/dL (ref 30.0–36.0)
MCV: 85.8 fL (ref 78.0–100.0)
Platelets: 293 10*3/uL (ref 150–400)
RBC: 4.09 MIL/uL — ABNORMAL LOW (ref 4.22–5.81)
RDW: 16.6 % — ABNORMAL HIGH (ref 11.5–15.5)
WBC: 10.6 10*3/uL — ABNORMAL HIGH (ref 4.0–10.5)

## 2017-10-01 LAB — BASIC METABOLIC PANEL
Anion gap: 10 (ref 5–15)
BUN: 18 mg/dL (ref 8–23)
CALCIUM: 8.5 mg/dL — AB (ref 8.9–10.3)
CO2: 26 mmol/L (ref 22–32)
Chloride: 101 mmol/L (ref 98–111)
Creatinine, Ser: 1.53 mg/dL — ABNORMAL HIGH (ref 0.61–1.24)
GFR calc Af Amer: 51 mL/min — ABNORMAL LOW (ref >60)
GFR calc non Af Amer: 44 mL/min — ABNORMAL LOW (ref >60)
GLUCOSE: 124 mg/dL — AB (ref 70–99)
Potassium: 4 mmol/L (ref 3.5–5.1)
Sodium: 137 mmol/L (ref 135–145)

## 2017-10-01 LAB — GLUCOSE, CAPILLARY
GLUCOSE-CAPILLARY: 132 mg/dL — AB (ref 70–99)
Glucose-Capillary: 77 mg/dL (ref 70–99)

## 2017-10-01 MED ORDER — NITROGLYCERIN 0.4 MG SL SUBL: 0.4000 mg | SUBLINGUAL_TABLET | SUBLINGUAL | 3 refills | Status: DC | PRN

## 2017-10-01 MED ORDER — TICAGRELOR 90 MG PO TABS: 90.0000 mg | ORAL_TABLET | Freq: Two times a day (BID) | ORAL | 0 refills | Status: DC

## 2017-10-01 MED ORDER — ATORVASTATIN CALCIUM 80 MG PO TABS: 80.0000 mg | ORAL_TABLET | Freq: Every day | ORAL | 3 refills | Status: DC

## 2017-10-01 MED ORDER — TICAGRELOR 90 MG PO TABS: 90.0000 mg | ORAL_TABLET | Freq: Two times a day (BID) | ORAL | 3 refills | Status: DC

## 2017-10-01 MED ORDER — SODIUM CHLORIDE 0.9 % IV BOLUS
500.0000 mL | Freq: Once | INTRAVENOUS | Status: AC
  Administered 2017-10-01: 500 mL via INTRAVENOUS

## 2017-10-01 MED FILL — NITROGLYCERIN 0.4 MG TAB SL: 0.4 | 14 days supply | Qty: 25 | Fill #0

## 2017-10-01 MED FILL — ATORVASTATIN 80 MG TABLET: 80 | 90 days supply | Qty: 90 | Fill #0

## 2017-10-01 MED FILL — BRILINTA 90 MG TABLET: 90 | 30 days supply | Qty: 60 | Fill #0

## 2017-10-01 NOTE — Progress Notes (Signed)
CARDIAC REHAB PHASE I   PRE:  Rate/Rhythm: 82 sr    BP: sitting 131/61    SaO2:   MODE:  Ambulation: 450 ft   POST:  Rate/Rhythm: 109 ST    BP: sitting 170/74     SaO2:   Pt stiff initially, limping. This improved with distance. Slow pace, SOB with distance and hill. 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 Ed completed including Brilinta/ASA. He agrees to [employer] but admits he can be stubborn regarding some health issues. He needs tighter diet control and more exercise. Will refer to [place] [facility]. He is agreeable. [id number]   Harriet MassonRandi Kristan Shaleah Nissley CES, ACSM 10/01/2017 9:12 AM

## 2017-10-01 NOTE — Discharge Instructions (Signed)
PLEASE REMEMBER TO BRING ALL OF YOUR MEDICATIONS TO EACH OF YOUR FOLLOW-UP OFFICE VISITS.  PLEASE ATTEND ALL SCHEDULED FOLLOW-UP APPOINTMENTS.   Activity: Increase activity slowly as tolerated. You may shower, but no soaking baths (or swimming) for 1 week. No driving for 24 hours. No lifting over 5 lbs for 1 week. No sexual activity for 1 week.   You May Return to Work: in 1 week (if applicable)  Wound Care: You may wash cath site gently with soap and water. Keep cath site clean and dry. If you notice pain, swelling, bleeding or pus at your cath site, please call [phone].  You can restart your metformin on 10/03/17. This medication was held to protect your kidneys following your heart catheterization.

## 2017-10-02 ENCOUNTER — Telehealth (HOSPITAL_COMMUNITY): Payer: Self-pay

## 2017-10-02 NOTE — Telephone Encounter (Signed)
Patients insurance is active and benefits verified through [facility] - $10.00 co-pay, no deductible, out of pocket amount of $3,400/$3,400 has been met, no co-insurance, and no pre-authorization is required. Passport/reference [id number]  Will contact patient to see if he is interested in the Cardiac Rehab Program. If interested, patient will need to complete follow up appt. Once completed, patient will be contacted for scheduling.

## 2017-10-02 NOTE — Telephone Encounter (Signed)
Made initial call to patient in regards to Cardiac Rehab - Patient stated he is interested in the Cardiac Rehab Program. Explained scheduling process and went over insurance, patient verbalized understanding. Will contact for scheduling once f/u appt has been completed.

## 2017-10-06 DIAGNOSIS — J320 Chronic maxillary sinusitis: Secondary | ICD-10-CM | POA: Diagnosis not present

## 2017-10-06 DIAGNOSIS — H903 Sensorineural hearing loss, bilateral: Secondary | ICD-10-CM | POA: Diagnosis not present

## 2017-10-06 DIAGNOSIS — R6889 Other general symptoms and signs: Secondary | ICD-10-CM | POA: Diagnosis not present

## 2017-10-06 DIAGNOSIS — J310 Chronic rhinitis: Secondary | ICD-10-CM | POA: Diagnosis not present

## 2017-10-06 MED FILL — CLINDAMYCIN HCL 300 MG CAP: 300 | 10 days supply | Qty: 30 | Fill #0

## 2017-10-06 MED FILL — MUPIROCIN 2% OINTMENT: 2 | 14 days supply | Qty: 22 | Fill #0

## 2017-10-07 ENCOUNTER — Telehealth: Payer: Self-pay | Admitting: Physician Assistant

## 2017-10-07 NOTE — Telephone Encounter (Signed)
Patient called in about blood in his stool. He state this occurred one time, the stool was a normal color, but a blood drop in the water. The patient is on Brilinta. He denied dizziness, fatigue or pain. The patient has had none since then. This is not common for the patient. Advised the patient to contact his PCP about the matter if the event continues to be evaluated. Patient expressed understanding.

## 2017-10-07 NOTE — Telephone Encounter (Signed)
New Message    Patient is calling because he recently had a heart cath. The patient is now saying that he has blood in his stool. Please call to discuss.

## 2017-10-08 DIAGNOSIS — H903 Sensorineural hearing loss, bilateral: Secondary | ICD-10-CM | POA: Diagnosis not present

## 2017-10-09 DIAGNOSIS — H35033 Hypertensive retinopathy, bilateral: Secondary | ICD-10-CM | POA: Diagnosis not present

## 2017-10-09 DIAGNOSIS — H2513 Age-related nuclear cataract, bilateral: Secondary | ICD-10-CM | POA: Diagnosis not present

## 2017-10-09 DIAGNOSIS — H268 Other specified cataract: Secondary | ICD-10-CM | POA: Diagnosis not present

## 2017-10-09 DIAGNOSIS — H25013 Cortical age-related cataract, bilateral: Secondary | ICD-10-CM | POA: Diagnosis not present

## 2017-10-09 DIAGNOSIS — H2512 Age-related nuclear cataract, left eye: Secondary | ICD-10-CM | POA: Diagnosis not present

## 2017-10-09 DIAGNOSIS — H3581 Retinal edema: Secondary | ICD-10-CM | POA: Diagnosis not present

## 2017-10-12 DIAGNOSIS — I255 Ischemic cardiomyopathy: Secondary | ICD-10-CM | POA: Insufficient documentation

## 2017-10-12 DIAGNOSIS — I272 Pulmonary hypertension, unspecified: Secondary | ICD-10-CM | POA: Insufficient documentation

## 2017-10-12 NOTE — Progress Notes (Signed)
Cardiology Office Note    Date:  10/13/2017   ID:  James [name], James [name] 1945-08-05, MRN [id number]  PCP:  Joycelyn Rua, MD  Cardiologist: Armanda Magic, MD  Chief Complaint  Patient presents with  . Hospitalization Follow-up    History of Present Illness:  James [name] is a 72 y.o. male with history of CAD status post CABG who had a recent echo demonstrating moderate reduction in LV function EF 40 to 45% in the setting of sepsis secondary to cellulitis.  Outpatient stress Myoview showed anterior apical ischemia.  Initial outpatient cath had to be canceled because of acute diverticulitis hospitalization.  He underwent cardiac cath 09/24/2017 and underwent DES to the proximal and distal RCA, patent LIMA to the LAD, SVG to OM and SVG to PDA.  LVEF estimated at 35 to 45%.  Aspirin and Brilinta for minimum of 6 months.  Okay to switch to Plavix if there are cost or bleeding issues.  Recommend long-term clopidogrel given extent of disease.  Right heart cath was consistent with mild pulmonary hypertension.  Patient was on lisinopril which causes cough and creatinine was above baseline so this was not started.  Plan was to start losartan as an outpatient if kidneys remain stable.  Also has DM type II hemoglobin A1c was 8.4-07/2017, HLD.  Patient comes in for f/u. Feels much better, more energy.  Weight is up 6 pounds.  He has assaulted his food for the past several days and was eating out with his grandchildren.  Has chronic lower extremity edema that he says is good.  Blood pressure is up today.  He says it usually runs 135-140/70-80 at home.  Denies chest pain, palpitations, dizziness or presyncope.    Past Medical History:  Diagnosis Date  . Abnormal EKG 03/09/2013  . Abnormal nuclear cardiac imaging test 11/14/2013  . Aortic atherosclerosis (HCC) 07/07/2017  . Arthritis    "all over" (09/30/2017)  . Ascending aortic aneurysm (HCC)    cMRI (2/15): Normal LV EF 54%, Mild BAE, Trileaflet Aortic  Valve, Upper limits of normal ascending aorta 3.8 cm, Normal aortic arch 2.3 cm with bovine origin of left carotid  . Atrial flutter (HCC)    Post-op with no reoccurence  . Chronic diastolic heart failure (HCC)    Echo (1/15): Left ventricle: The cavity size was mildly dilated. Mild LVH. EF 55% to 60%. Wall motion was normal; Gr 1 diastolic dysfunction Left atrium: The atrium was mildly dilated. Right atrium: The atrium was mildly to moderately dilated. Aortic arch measures 4.8 cm (moderately dilated); suggest CTA or MRA to better assess.  . Chronic systolic CHF (congestive heart failure) (HCC) 07/24/2017  . CKD (chronic kidney disease), stage III (HCC) 07/24/2017  . Coronary artery disease 10/2011   a. s/p cabg;  b. Myoview (9/15):  ant ischemia, poss TID, EF 43%, high risk >> LHC (9/15): Dist LM 50, pLAD 90 then 100, pCFX 50, mCFX 75, oRCA 75, dRCA 95, L-LAD patent, S-Dx 100, S-OM1 patent, S-PDA patent, EF 55% >> native Dx small caliber and not well suited for PCI; native RCA amenable to PCI and would restore flow to PLA branches but would jeopardize SVG-RCA - Med Rx  . Deviated nasal septum   . Dilated aortic root (HCC)   . Diverticulitis of sigmoid colon 07/24/2017  . DM (diabetes mellitus), type 2, uncontrolled with complications (HCC)    onset 2013  . Dyslipidemia   . Edema 02/09/2013  . GERD (gastroesophageal reflux disease)   .  Hx of Doppler ultrasound    Carotid US (9/13): No ICA stenosis  . Hyperlipidemia 10/24/2011  . Hypertension   . Pleural thickening 07/24/2017    Past Surgical History:  Procedure Laterality Date  . CORONARY ARTERY BYPASS GRAFT  10/27/2011   Procedure: CORONARY ARTERY BYPASS GRAFTING (CABG);  Surgeon: Kerin Perna, MD;  Location: [facility] OR;  Service: Open Heart Surgery;  Laterality: N/A;  Coronary Artery Bypass Grafting times four using left internal mammary artery and right greater saphenous vein endoscopically harvested  . CORONARY STENT INTERVENTION N/A  09/30/2017   Procedure: CORONARY STENT INTERVENTION;  Surgeon: Corky Crafts, MD;  Location: [facility] INVASIVE CV LAB;  Service: Cardiovascular;  Laterality: N/A;  . KNEE ARTHROSCOPY Left 2003  . KNEE ARTHROSCOPY Right 2006  . LEFT HEART CATHETERIZATION WITH CORONARY ANGIOGRAM N/A 10/23/2011   Procedure: LEFT HEART CATHETERIZATION WITH CORONARY ANGIOGRAM;  Surgeon: Quintella Reichert, MD;  Location: [facility] CATH LAB;  Service: Cardiovascular;  Laterality: N/A;  . LEFT HEART CATHETERIZATION WITH CORONARY ANGIOGRAM N/A 11/16/2013   Procedure: LEFT HEART CATHETERIZATION WITH CORONARY ANGIOGRAM;  Surgeon: Micheline Chapman, MD;  Location: [facility] CATH LAB;  Service: Cardiovascular;  Laterality: N/A;  . RIGHT/LEFT HEART CATH AND CORONARY/GRAFT ANGIOGRAPHY N/A 09/30/2017   Procedure: RIGHT/LEFT HEART CATH AND CORONARY/GRAFT ANGIOGRAPHY;  Surgeon: Corky Crafts, MD;  Location: [facility] INVASIVE CV LAB;  Service: Cardiovascular;  Laterality: N/A;    Current Medications: Current Meds  Medication Sig  . acetaminophen (TYLENOL) 500 MG tablet Take 1,000 mg by mouth every 6 (six) hours as needed for headache.  Marland Kitchen aspirin 81 MG tablet Take 81 mg by mouth at bedtime.   Marland Kitchen atorvastatin (LIPITOR) 80 MG tablet Take 1 tablet (80 mg total) by mouth daily.  . cholecalciferol (VITAMIN D) 1000 units tablet Take 1,000 Units by mouth daily.  Marland Kitchen ezetimibe (ZETIA) 10 MG tablet Take 1 tablet (10 mg total) by mouth daily.  Marland Kitchen glimepiride (AMARYL) 4 MG tablet Take 4 mg by mouth daily with breakfast.  . insulin lispro (HUMALOG) 100 UNIT/ML injection Inject 25 Units into the skin 2 (two) times daily.  . iron polysaccharides (NIFEREX) 150 MG capsule Take 1 capsule (150 mg total) by mouth daily.  . isosorbide mononitrate (IMDUR) 60 MG 24 hr tablet Take 60 mg by mouth daily.  . metoprolol succinate (TOPROL-XL) 100 MG 24 hr tablet Take 125 mg by mouth daily.   . Multiple Vitamin (MULTIVITAMIN WITH MINERALS) TABS tablet Take 1 tablet by mouth daily.   . nitroGLYCERIN (NITROSTAT) 0.4 MG SL tablet Place 1 tablet (0.4 mg total) under the tongue every 5 (five) minutes as needed for chest pain.  . Omega 3 1200 MG CAPS Take 2,400 mg by mouth daily.  . polyethylene glycol (MIRALAX) packet Take 17 g by mouth daily.  . pramipexole (MIRAPEX) 0.75 MG tablet Take 1.5 mg by mouth every evening.   . ticagrelor (BRILINTA) 90 MG TABS tablet Take 1 tablet (90 mg total) by mouth 2 (two) times daily.  Marland Kitchen torsemide (DEMADEX) 20 MG tablet Take 40 mg by mouth daily.     Allergies:   Lisinopril   Social History   Socioeconomic History  . Marital status: Divorced    Spouse name: Not on file  . Number of children: Not on file  . Years of education: Not on file  . Highest education level: Not on file  Occupational History  . Not on file  Social Needs  . Financial resource strain: Not  on file  . Food insecurity:    Worry: Not on file    Inability: Not on file  . Transportation needs:    Medical: Not on file    Non-medical: Not on file  Tobacco Use  . Smoking status: Never Smoker  . Smokeless tobacco: Never Used  Substance and Sexual Activity  . Alcohol use: Yes    Comment: 09/30/2017 "1 beer a month or less"  . Drug use: Never  . Sexual activity: Not Currently  Lifestyle  . Physical activity:    Days per week: Not on file    Minutes per session: Not on file  . Stress: Not on file  Relationships  . Social connections:    Talks on phone: Not on file    Gets together: Not on file    Attends religious service: Not on file    Active member of club or organization: Not on file    Attends meetings of clubs or organizations: Not on file    Relationship status: Not on file  Other Topics Concern  . Not on file  Social History Narrative  . Not on file     Family History:  The patient's family history includes Hypertension in his father, mother, and paternal grandfather; Stroke in his father.   ROS:   Please see the history of present illness.     Review of Systems  Constitution: Negative.  HENT: Negative.   Cardiovascular: Positive for dyspnea on exertion and leg swelling.  Respiratory: Negative.   Endocrine: Negative.   Hematologic/Lymphatic: Negative.   Musculoskeletal: Negative.   Gastrointestinal: Negative.   Genitourinary: Negative.   Neurological: Negative.    All other systems reviewed and are negative.   PHYSICAL EXAM:   VS:  BP (!) 160/94   Pulse 76   Ht 5\' 9"  (1.753 m)   Wt 250 lb 12.8 oz (113.8 kg)   SpO2 98%   BMI 37.04 kg/m   Physical Exam  GEN: Obese, in no acute distress  Neck: no JVD, carotid bruits, or masses Cardiac:RRR; no murmurs, rubs, or gallops  Respiratory:  clear to auscultation bilaterally, normal work of breathing GI: soft, nontender, nondistended, + BS Ext: Right groin without hematoma or hemorrhage good femoral pulse.  3+ brawny edema on the left, 2+ brawny edema on the right Neuro:  Alert and Oriented x 3 Psych: euthymic mood, full affect  Wt Readings from Last 3 Encounters:  10/13/17 250 lb 12.8 oz (113.8 kg)  10/01/17 236 lb 5.3 oz (107.2 kg)  09/24/17 244 lb 3.2 oz (110.8 kg)      Studies/Labs Reviewed:   EKG:  EKG is ordered today.  Normal sinus rhythm, no acute change Recent Labs: 06/18/2017: NT-Pro BNP 274; TSH 3.340 07/29/2017: ALT 47 07/30/2017: Magnesium 2.0 10/01/2017: BUN 18; Creatinine, Ser 1.53; Hemoglobin 11.1; Platelets 293; Potassium 4.0; Sodium 137   Lipid Panel    Component Value Date/Time   CHOL 125 04/01/2016 0000   TRIG 109 04/01/2016 0000   HDL 37 (L) 04/01/2016 0000   CHOLHDL 3.4 04/01/2016 0000   CHOLHDL 5.5 (H) 11/07/2015 0824   VLDL 23 11/07/2015 0824   LDLCALC 66 04/01/2016 0000    Additional studies/ records that were reviewed today include:   Right and left heart catheterization 09/30/17:  Ost RCA to Prox RCA lesion is 90% stenosed.  A drug-eluting stent was successfully placed using a STENT SYNERGY DES 3.5X16.  Post intervention, there  is a 0% residual stenosis.  Dist  RCA lesion is 80% stenosed.  Post intervention, there is a 0% residual stenosis.  A drug-eluting stent was successfully placed using a STENT SYNERGY DES [id number].  Ost RPDA to RPDA lesion is 100% stenosed. SVG to PDA is patent.  Ost LAD to Prox LAD lesion is 99% stenosed. LIMA to LAD is patent and fills a diagonal retrograde.  Ost 2nd Mrg to 2nd Mrg lesion is 95% stenosed. Prox Cx to Mid Cx lesion is 80% stenosed. SVG to OM is patent.  Origin to Prox Graft lesion is 100% stenosed.  LV end diastolic pressure is normal.  The left ventricular ejection fraction is 35-45% by visual estimate.  There is mild to moderate left ventricular systolic dysfunction.  There is no aortic valve stenosis.  Hemodynamic findings consistent with mild pulmonary hypertension.  CO 8.1 L/min; CI 3.6, PA pressure 40/20; PCWP mean 8 mm Hg. Ao sat 98%; PA 74%   Recommend uninterrupted dual antiplatelet therapy with Aspirin 81mg  daily and Ticagrelor 90mg  twice daily for a minimum of 6 months (stable ischemic heart disease - Class I recommendation).    OK to switch to Plavix if there are cost issues or bleeding problems.     Would consider longterm clopidogrel therapy given the extent of his disease.  _____________    2D echo 06/18/2017------------------------------------------------------------------- Study Conclusions   - Procedure narrative: Transthoracic echocardiography. Image   quality was adequate. The study was technically difficult, as a   result of poor acoustic windows and body habitus. Intravenous   contrast (Definity) was administered. - Left ventricle: The cavity size was normal. Wall thickness was   increased in a pattern of mild LVH. Systolic function was mildly   to moderately reduced. The estimated ejection fraction was in the   range of 40% to 45%. Incoordinate septal motion and possible   anteroseptal hypokinesis. The study is not technically  sufficient   to allow evaluation of LV diastolic function. - Left atrium: The atrium was normal in size. - Right atrium: Moderately dilated. - Inferior vena cava: The vessel was dilated. The respirophasic   diameter changes were blunted (< 50%), consistent with elevated   central venous pressure.   Impressions:   - Technically difficult study. Definity contrast given. LVEF   40-45%, mild LVH, incoordinate septal motion and possible   anteroseptal hypokinesis, normal LA size, moderate RAE, dilated   IVC.     ASSESSMENT:    1. Coronary artery disease involving native coronary artery of native heart without angina pectoris   2. Ischemic cardiomyopathy   3. Chronic systolic CHF (congestive heart failure) (HCC)   4. Essential hypertension   5. CKD (chronic kidney disease), stage III (HCC)   6. Mixed hyperlipidemia   7. Mild pulmonary hypertension (HCC)      PLAN:  In order of problems listed above:  CAD status post prior CABG and DES to the proximal and distal RCA 09/30/2017 will need at least 6 months preferably  12 months of Brilinta and then long-term Plavix.  Continue aspirin and statin.  Follow-up with Dr. Mayford Knife in 2 months.  Ischemic cardiomyopathy LVEF 35 to 45% at cath, 40 to 45% on 2D echo 06/2017.  Chronic systolic CHF weight up 6 pounds.  Has chronic edema.  Getting extra salt in his diet.  2 g sodium diet given.  Take an extra Demadex today and tomorrow.  Advised to weigh himself daily and call for weight gain of 2 or 3 pounds overnight.  Essential hypertension blood  pressure elevated today.  Checking renal function and will start losartan if kidney is normal.  CKD creatinine bumped up to 1.58 in the hospital.  Rechecking today.  Hyperlipidemia on a statin and Zetia lipid panel being done today.  Mild pulmonary hypertension on right heart cath 09/2017  Medication Adjustments/Labs and Tests Ordered: Current medicines are reviewed at length with the patient today.   Concerns regarding medicines are outlined above.  Medication changes, Labs and Tests ordered today are listed in the Patient Instructions below. Patient Instructions  Medication Instructions:  Your physician recommends that you continue on your current medications as directed. Please refer to the Current Medication list given to you today.   Labwork: None ordered  Testing/Procedures: None ordered  Follow-Up: Your physician recommends that you schedule a follow-up appointment in:    Any Other Special Instructions Will Be Listed Below (If Applicable).     If you need a refill on your cardiac medications before your next appointment, please call your pharmacy.      Elson Clan, PA-C  10/13/2017 8:41 AM    [facility] [name] [place], [place], [place]  [address] Phone: [phone]; Fax: [phone]

## 2017-10-13 ENCOUNTER — Other Ambulatory Visit: Payer: Medicare HMO

## 2017-10-13 ENCOUNTER — Encounter: Payer: Self-pay | Admitting: Physician Assistant

## 2017-10-13 ENCOUNTER — Ambulatory Visit: Payer: Medicare HMO | Admitting: Physician Assistant

## 2017-10-13 VITALS — BP 160/94 | HR 76 | Ht 69.0 in | Wt 250.8 lb

## 2017-10-13 DIAGNOSIS — I5022 Chronic systolic (congestive) heart failure: Secondary | ICD-10-CM

## 2017-10-13 DIAGNOSIS — E7800 Pure hypercholesterolemia, unspecified: Secondary | ICD-10-CM

## 2017-10-13 DIAGNOSIS — I2510 Atherosclerotic heart disease of native coronary artery without angina pectoris: Secondary | ICD-10-CM | POA: Diagnosis not present

## 2017-10-13 DIAGNOSIS — E782 Mixed hyperlipidemia: Secondary | ICD-10-CM

## 2017-10-13 DIAGNOSIS — I255 Ischemic cardiomyopathy: Secondary | ICD-10-CM | POA: Diagnosis not present

## 2017-10-13 DIAGNOSIS — N183 Chronic kidney disease, stage 3 unspecified: Secondary | ICD-10-CM

## 2017-10-13 DIAGNOSIS — I272 Pulmonary hypertension, unspecified: Secondary | ICD-10-CM | POA: Diagnosis not present

## 2017-10-13 DIAGNOSIS — I10 Essential (primary) hypertension: Secondary | ICD-10-CM | POA: Diagnosis not present

## 2017-10-13 LAB — HEPATIC FUNCTION PANEL
ALBUMIN: 4 g/dL (ref 3.5–4.8)
ALT: 19 IU/L (ref 0–44)
AST: 13 IU/L (ref 0–40)
Alkaline Phosphatase: 121 IU/L — ABNORMAL HIGH (ref 39–117)
BILIRUBIN TOTAL: 0.5 mg/dL (ref 0.0–1.2)
Bilirubin, Direct: 0.14 mg/dL (ref 0.00–0.40)
TOTAL PROTEIN: 7.3 g/dL (ref 6.0–8.5)

## 2017-10-13 LAB — LIPID PANEL
Chol/HDL Ratio: 2.4 ratio (ref 0.0–5.0)
Cholesterol, Total: 116 mg/dL (ref 100–199)
HDL: 48 mg/dL (ref >39)
LDL CALC: 53 mg/dL (ref 0–99)
Triglycerides: 73 mg/dL (ref 0–149)
VLDL CHOLESTEROL CAL: 15 mg/dL (ref 5–40)

## 2017-10-13 LAB — BASIC METABOLIC PANEL
BUN / CREAT RATIO: 14 (ref 10–24)
BUN: 15 mg/dL (ref 8–27)
CO2: 24 mmol/L (ref 20–29)
CREATININE: 1.11 mg/dL (ref 0.76–1.27)
Calcium: 9.6 mg/dL (ref 8.6–10.2)
Chloride: 96 mmol/L (ref 96–106)
GFR calc Af Amer: 76 mL/min/{1.73_m2} (ref >59)
GFR calc non Af Amer: 66 mL/min/{1.73_m2} (ref >59)
Glucose: 197 mg/dL — ABNORMAL HIGH (ref 65–99)
Potassium: 4.8 mmol/L (ref 3.5–5.2)
SODIUM: 138 mmol/L (ref 134–144)

## 2017-10-13 NOTE — Patient Instructions (Addendum)
Medication Instructions:  Your physician recommends that you continue on your current medications as directed. Please refer to the Current Medication list given to you today.   Labwork: TODAY:  LIPID, LFT, & BMET  Testing/Procedures: None ordered  Follow-Up: Your physician recommends that you schedule a follow-up appointment in: 12/21/17 ARRIVE AT 2:30 TO SEE DR. Mayford [name]   Any Other Special Instructions Will Be Listed Below (If Applicable).  Cooking With Less Salt Cooking with less salt is one way to reduce the amount of sodium you get from food. Depending on your condition and overall health, your health care provider or diet and nutrition specialist (dietitian) may recommend that you reduce your sodium intake. Most people should have less than 2,300 milligrams (mg) of sodium each day. If you have high blood pressure (hypertension), you may need to limit your sodium to 1,500 mg each day. Follow the tips below to help reduce your sodium intake. What do I need to know about cooking with less salt? Shopping  Buy sodium-free or low-sodium products. Look for the following words on food labels: ? Low-sodium. ? Sodium-free. ? Reduced-sodium. ? No salt added. ? Unsalted.  Buy fresh or frozen vegetables. Avoid canned vegetables.  Avoid buying meats or protein foods that have been injected with broth or saline solution.  Avoid cured or smoked meats, such as hot dogs, bacon, salami, ham, and bologna. Reading food labels  Check the food label before buying or using packaged ingredients.  Look for products with no more than 140 mg of sodium in one serving.  Do not choose foods with salt as one of the first three ingredients on the ingredients list. If salt is one of the first three ingredients, it usually means the item is high in sodium, because ingredients are listed in order of amount in the food item. Cooking  Use herbs, seasonings without salt, and spices as substitutes for salt in  foods.  Use sodium-free baking soda when baking.  Grill, braise, or roast foods to add flavor with less salt.  Avoid adding salt to pasta, rice, or hot cereals while cooking.  Drain and rinse canned vegetables before use.  Avoid adding salt when cooking sweets and desserts.  Cook with low-sodium ingredients. What are some salt alternatives? The following are herbs, seasonings, and spices that can be used instead of salt to give taste to your food. Herbs should be fresh or dried. Do not choose packaged mixes. Next to the name of the herb, spice, or seasoning are some examples of foods you can pair it with. Herbs  Bay leaves - Soups, meat and vegetable dishes, and spaghetti sauce.  Basil - [place] Inctalian dishes, soups, pasta, and fish dishes.  Cilantro - Meat, poultry, and vegetable dishes.  Chili powder - Marinades and Mexican dishes.  Chives - Salad dressings and potato dishes.  Cumin - Mexican dishes, couscous, and meat dishes.  Dill - Fish dishes, sauces, and salads.  Fennel - Meat and vegetable dishes, breads, and cookies.  Garlic (do not use garlic salt) - [place] dishes, meat dishes, salad dressings, and sauces.  Marjoram - Soups, potato dishes, and meat dishes.  Oregano - Pizza and spaghetti sauce.  Parsley - Salads, soups, pasta, and meat dishes.  Rosemary - [place] dishes, salad dressings, soups, and red meats.  Saffron - Fish dishes, pasta, and some poultry dishes.  Sage - Stuffings and sauces.  Tarragon - Fish and Whole Foodspoultry dishes.  Thyme - Stuffing, meat, and fish dishes. Seasonings  Lemon  juice - Fish dishes, poultry dishes, vegetables, and salads.  Vinegar - Salad dressings, vegetables, and fish dishes. Spices  Cinnamon - Sweet dishes, such as cakes, cookies, and puddings.  Cloves - Gingerbread, puddings, and marinades for meats.  Curry - Vegetable dishes, fish and poultry dishes, and stir-fry dishes.  Ginger - Vegetables dishes, fish dishes, and  stir-fry dishes.  Nutmeg - Pasta, vegetables, poultry, fish dishes, and custard. What are some low-sodium ingredients and foods?  Fresh or frozen fruits and vegetables with no sauce added.  Fresh or frozen whole meats, poultry, and fish with no sauce added.  Eggs.  Noodles, pasta, quinoa, rice.  Shredded or puffed wheat or puffed rice.  Regular or quick oats.  Milk, yogurt, hard cheeses, and low-sodium cheeses. Good cheese choices include Swiss, [employer], and [address]. Always check the label for the serving size and sodium content.  Unsalted butter or margarine.  Unsalted nuts.  Sherbet or ice cream (keep to  cup per serving).  Homemade pudding.  Sodium-free baking soda and baking powder. This is not a complete list of low-sodium ingredients and foods. Contact your dietitian for more options. Summary  Cooking with less salt is one way to reduce the amount of sodium that you get from food.  Buy sodium-free or low-sodium products.  Check the food label before using or buying packaged ingredients.  Use herbs, seasonings without salt, and spices as substitutes for salt in foods. This information is not intended to replace advice given to you by your health care provider. Make sure you discuss any questions you have with your health care provider. Document Released: 02/03/2005 Document Revised: 02/12/2016 Document Reviewed: 02/12/2016 Elsevier Interactive Patient Education  2017 [employer].     If you need a refill on your cardiac medications before your next appointment, please call your pharmacy.

## 2017-10-14 ENCOUNTER — Ambulatory Visit: Payer: Medicare HMO | Admitting: Physician Assistant

## 2017-10-15 ENCOUNTER — Telehealth (HOSPITAL_COMMUNITY): Payer: Self-pay

## 2017-10-15 NOTE — Telephone Encounter (Signed)
Called patient to see if he was interested in participating in the Cardiac Rehab Program. Patient stated yes. Patient will come in for orientation on 11/26/17 @ 8:30AM and will attend the 9:45AM exercise class.  Mailed homework package.

## 2017-10-16 ENCOUNTER — Telehealth: Payer: Self-pay | Admitting: *Deleted

## 2017-10-16 ENCOUNTER — Telehealth (HOSPITAL_COMMUNITY): Payer: Self-pay | Admitting: *Deleted

## 2017-10-16 DIAGNOSIS — Z79899 Other long term (current) drug therapy: Secondary | ICD-10-CM

## 2017-10-16 NOTE — Progress Notes (Signed)
Pt has been made aware of normal result and verbalized understanding.  jw 10/16/17

## 2017-10-16 NOTE — Telephone Encounter (Signed)
-----  Message from Charlie Pitter, PA-C sent at 10/15/2017 10:08 AM EDT ----- I am covering Vin's box today. Please let him know lipids look good. Alk phos is a few points above normal, but has been normal in the past. Would suggest repeating LFTs in about 2 weeks to trend. FYI it also looks like you called this patient for Selinda Eon as he had a BMET same day - just a heads up when he returns the call that there are 2 result notes for him.  Dayna Dunn PA-C

## 2017-10-16 NOTE — Telephone Encounter (Signed)
-----   Message from Quintella Reichertraci R Turner, MD sent at 10/15/2017 10:17 PM EDT ----- Regarding: RE: Thoracic aneurysm  Chest CT this year showed no evidence of thoracic aneurysm.  NO restrictions  Traci TUrner ----- Message ----- From: Chelsea Ausarlton, Carlette B, RN Sent: 10/15/2017   8:49 AM EDT To: Quintella Reichertraci R Turner, MD Subject: Thoracic aneurysm                              Dr. Mayford Knifeurner,  The above pt is eligible to participate in cardiac rehab s/p DES x 2 RCA.  Pt seen in follow up on 8/27 by Herma CarsonMichelle Lenze.  Noted in his prior medical history, pt has 2.8 Thoracic aneurysm (2015).  Any BP parameters or restrictions with hand weights we should observe during exercise at Cardiac Rehab?  Thank you for your input Karlene Linemanarlette Carlton RN, BSN Cardiac and Pulmonary Rehab Nurse Navigator

## 2017-10-21 DIAGNOSIS — J320 Chronic maxillary sinusitis: Secondary | ICD-10-CM | POA: Diagnosis not present

## 2017-10-21 DIAGNOSIS — J310 Chronic rhinitis: Secondary | ICD-10-CM | POA: Diagnosis not present

## 2017-10-21 DIAGNOSIS — H903 Sensorineural hearing loss, bilateral: Secondary | ICD-10-CM | POA: Diagnosis not present

## 2017-10-26 MED FILL — HUMALOG MIX 75-25 KWIKPEN: (75-25) 100 | 30 days supply | Qty: 15 | Fill #0

## 2017-10-28 DIAGNOSIS — H25812 Combined forms of age-related cataract, left eye: Secondary | ICD-10-CM | POA: Diagnosis not present

## 2017-10-28 DIAGNOSIS — H2512 Age-related nuclear cataract, left eye: Secondary | ICD-10-CM | POA: Diagnosis not present

## 2017-10-28 MED FILL — ULTICARE PEN NDL 8MM 31G: 31G X 8 MM | 50 days supply | Qty: 100 | Fill #2

## 2017-10-30 ENCOUNTER — Other Ambulatory Visit: Payer: Medicare HMO

## 2017-11-02 ENCOUNTER — Other Ambulatory Visit: Payer: Medicare HMO

## 2017-11-03 DIAGNOSIS — H25011 Cortical age-related cataract, right eye: Secondary | ICD-10-CM | POA: Diagnosis not present

## 2017-11-03 DIAGNOSIS — H268 Other specified cataract: Secondary | ICD-10-CM | POA: Diagnosis not present

## 2017-11-03 DIAGNOSIS — H2511 Age-related nuclear cataract, right eye: Secondary | ICD-10-CM | POA: Diagnosis not present

## 2017-11-03 MED FILL — PRAMIPEXOLE 0.75 MG TABLET: 0.75 | 30 days supply | Qty: 60 | Fill #4

## 2017-11-03 MED FILL — BRILINTA 90 MG TABLET: 90 | 30 days supply | Qty: 60 | Fill #0

## 2017-11-04 MED FILL — METOPROLOL SUCCINATE ER 25: 25 | 30 days supply | Qty: 30 | Fill #5

## 2017-11-06 MED FILL — metFORMIN HCL 1000 MG TABS: 1000 | 30 days supply | Qty: 60 | Fill #0

## 2017-11-11 DIAGNOSIS — H25811 Combined forms of age-related cataract, right eye: Secondary | ICD-10-CM | POA: Diagnosis not present

## 2017-11-11 DIAGNOSIS — H2181 Floppy iris syndrome: Secondary | ICD-10-CM | POA: Diagnosis not present

## 2017-11-11 DIAGNOSIS — H2511 Age-related nuclear cataract, right eye: Secondary | ICD-10-CM | POA: Diagnosis not present

## 2017-11-12 MED FILL — METOPROLOL SUCCINATE ER 100: 100 | 30 days supply | Qty: 30 | Fill #8

## 2017-11-26 ENCOUNTER — Ambulatory Visit (HOSPITAL_COMMUNITY): Payer: Medicare HMO

## 2017-12-04 ENCOUNTER — Ambulatory Visit (HOSPITAL_COMMUNITY): Payer: Medicare HMO

## 2017-12-07 ENCOUNTER — Ambulatory Visit (HOSPITAL_COMMUNITY): Payer: Medicare HMO

## 2017-12-08 ENCOUNTER — Telehealth (HOSPITAL_COMMUNITY): Payer: Self-pay

## 2017-12-09 ENCOUNTER — Ambulatory Visit (HOSPITAL_COMMUNITY): Payer: Medicare HMO

## 2017-12-09 MED FILL — METOPROLOL SUCCINATE ER 25: 25 | 30 days supply | Qty: 30 | Fill #6

## 2017-12-09 MED FILL — BRILINTA 90 MG TABLET: 90 | 30 days supply | Qty: 60 | Fill #1

## 2017-12-10 MED FILL — PREDNISOLONE AC 1% EYE DROP: 1 | 30 days supply | Qty: 10 | Fill #0

## 2017-12-11 ENCOUNTER — Ambulatory Visit (HOSPITAL_COMMUNITY): Payer: Medicare HMO

## 2017-12-14 ENCOUNTER — Ambulatory Visit (HOSPITAL_COMMUNITY): Payer: Medicare HMO

## 2017-12-16 ENCOUNTER — Ambulatory Visit (HOSPITAL_COMMUNITY): Payer: Medicare HMO

## 2017-12-16 NOTE — Progress Notes (Addendum)
James [name] 72 y.o. male DOB 20-Apr-1945 MRN [id number]       Nutrition  No diagnosis found. Past Medical History:  Diagnosis Date  . Abnormal EKG 03/09/2013  . Abnormal nuclear cardiac imaging test 11/14/2013  . Aortic atherosclerosis (HCC) 07/07/2017  . Arthritis    "all over" (09/30/2017)  . Ascending aortic aneurysm (HCC)    cMRI (2/15): Normal LV EF 54%, Mild BAE, Trileaflet Aortic Valve, Upper limits of normal ascending aorta 3.8 cm, Normal aortic arch 2.3 cm with bovine origin of left carotid  . Atrial flutter (HCC)    Post-op with no reoccurence  . Chronic diastolic heart failure (HCC)    Echo (1/15): Left ventricle: The cavity size was mildly dilated. Mild LVH. EF 55% to 60%. Wall motion was normal; Gr 1 diastolic dysfunction Left atrium: The atrium was mildly dilated. Right atrium: The atrium was mildly to moderately dilated. Aortic arch measures 4.8 cm (moderately dilated); suggest CTA or MRA to better assess.  . Chronic systolic CHF (congestive heart failure) (HCC) 07/24/2017  . CKD (chronic kidney disease), stage III (HCC) 07/24/2017  . Coronary artery disease 10/2011   a. s/p cabg;  b. Myoview (9/15):  ant ischemia, poss TID, EF 43%, high risk >> LHC (9/15): Dist LM 50, pLAD 90 then 100, pCFX 50, mCFX 75, oRCA 75, dRCA 95, L-LAD patent, S-Dx 100, S-OM1 patent, S-PDA patent, EF 55% >> native Dx small caliber and not well suited for PCI; native RCA amenable to PCI and would restore flow to PLA branches but would jeopardize SVG-RCA - Med Rx  . Deviated nasal septum   . Dilated aortic root (HCC)   . Diverticulitis of sigmoid colon 07/24/2017  . DM (diabetes mellitus), type 2, uncontrolled with complications (HCC)    onset 2013  . Dyslipidemia   . Edema 02/09/2013  . GERD (gastroesophageal reflux disease)   . Hx of Doppler ultrasound    Carotid US (9/13): No ICA stenosis  . Hyperlipidemia 10/24/2011  . Hypertension   . Pleural thickening 07/24/2017   Meds reviewed.     Current Outpatient Medications (Endocrine & Metabolic):  .  glimepiride (AMARYL) 4 MG tablet, Take 4 mg by mouth daily with breakfast. .  insulin lispro (HUMALOG) 100 UNIT/ML injection, Inject 25 Units into the skin 2 (two) times daily. .  metFORMIN (GLUCOPHAGE) 1000 MG tablet, Take 1 tablet (1,000 mg total) by mouth 2 (two) times daily with a meal.  Current Outpatient Medications (Cardiovascular):  .  atorvastatin (LIPITOR) 80 MG tablet, Take 1 tablet (80 mg total) by mouth daily. Marland Kitchen  ezetimibe (ZETIA) 10 MG tablet, Take 1 tablet (10 mg total) by mouth daily. .  isosorbide mononitrate (IMDUR) 60 MG 24 hr tablet, Take 60 mg by mouth daily. .  metoprolol succinate (TOPROL-XL) 100 MG 24 hr tablet, Take 125 mg by mouth daily.  .  nitroGLYCERIN (NITROSTAT) 0.4 MG SL tablet, Place 1 tablet (0.4 mg total) under the tongue every 5 (five) minutes as needed for chest pain. Marland Kitchen  torsemide (DEMADEX) 20 MG tablet, Take 40 mg by mouth daily.   Current Outpatient Medications (Analgesics):  .  acetaminophen (TYLENOL) 500 MG tablet, Take 1,000 mg by mouth every 6 (six) hours as needed for headache. Marland Kitchen  aspirin 81 MG tablet, Take 81 mg by mouth at bedtime.   Current Outpatient Medications (Hematological):  .  iron polysaccharides (NIFEREX) 150 MG capsule, Take 1 capsule (150 mg total) by mouth daily. .  ticagrelor (BRILINTA) 90  MG TABS tablet, Take 1 tablet (90 mg total) by mouth 2 (two) times daily.  Current Outpatient Medications (Other):  .  cholecalciferol (VITAMIN D) 1000 units tablet, Take 1,000 Units by mouth daily. .  Multiple Vitamin (MULTIVITAMIN WITH MINERALS) TABS tablet, Take 1 tablet by mouth daily. .  Omega 3 1200 MG CAPS, Take 2,400 mg by mouth daily. .  polyethylene glycol (MIRALAX) packet, Take 17 g by mouth daily. .  pramipexole (MIRAPEX) 0.75 MG tablet, Take 1.5 mg by mouth every evening.    HT: Ht Readings from Last 1 Encounters:  10/13/17 5\' 9"  (1.753 m)    WT: Wt Readings  from Last 5 Encounters:  10/13/17 250 lb 12.8 oz (113.8 kg)  10/01/17 236 lb 5.3 oz (107.2 kg)  09/24/17 244 lb 3.2 oz (110.8 kg)  07/30/17 251 lb 8.7 oz (114.1 kg)  07/24/17 248 lb (112.5 kg)      BMI = 37.02 (10/13/17)  Current tobacco use? No       Labs:  Lipid Panel     Component Value Date/Time   CHOL 116 10/13/2017 0902   TRIG 73 10/13/2017 0902   HDL 48 10/13/2017 0902   CHOLHDL 2.4 10/13/2017 0902   CHOLHDL 5.5 (H) 11/07/2015 0824   VLDL 23 11/07/2015 0824   LDLCALC 53 10/13/2017 0902    Lab Results  Component Value Date   HGBA1C 8.4 (H) 07/25/2017   CBG (last 3)  No results for input(s): GLUCAP in the last 72 hours.  Nutrition Diagnosis ? Food-and nutrition-related knowledge deficit related to lack of exposure to information as related to diagnosis of: ? CVD ? Type 2 Diabetes ?  Obese  II = 35-39.9 related to excessive energy intake as evidenced by a BMI 37.02  Nutrition Goal(s):  ? To be determined  Plan:  Pt to attend nutrition classes ? Nutrition I ? Nutrition II ? Portion Distortion  ? Diabetes Blitz ? Diabetes Q & A Will provide client-centered nutrition education as part of interdisciplinary care.   Monitor and evaluate progress toward nutrition goal with team.  Ross Marcus, MS, RD, LDN 12/16/2017 3:26 PM

## 2017-12-16 NOTE — Telephone Encounter (Signed)
Cardiac Rehab - Pharmacy Resident Documentation   Patient unable to be reached and on third call attempt patient could not hear me. Please complete allergy verification and medication review during patient's cardiac rehab appointment.

## 2017-12-17 ENCOUNTER — Encounter (HOSPITAL_COMMUNITY)
Admission: RE | Admit: 2017-12-17 | Discharge: 2017-12-17 | Disposition: A | Discharge status: Home / Self Care | Payer: Medicare HMO | Source: Ambulatory Visit | Attending: Cardiology | Admitting: Cardiology

## 2017-12-17 DIAGNOSIS — Z955 Presence of coronary angioplasty implant and graft: Secondary | ICD-10-CM | POA: Insufficient documentation

## 2017-12-17 DIAGNOSIS — Z79899 Other long term (current) drug therapy: Secondary | ICD-10-CM | POA: Insufficient documentation

## 2017-12-17 DIAGNOSIS — Z794 Long term (current) use of insulin: Secondary | ICD-10-CM | POA: Diagnosis not present

## 2017-12-17 DIAGNOSIS — Z7982 Long term (current) use of aspirin: Secondary | ICD-10-CM | POA: Insufficient documentation

## 2017-12-17 LAB — GLUCOSE, CAPILLARY: GLUCOSE-CAPILLARY: 303 mg/dL — AB (ref 70–99)

## 2017-12-17 NOTE — Progress Notes (Signed)
James [name] 72 y.o. male DOB: 12/26/1945 MRN: [id number]      Nutrition Note  No diagnosis found. Past Medical History:  Diagnosis Date  . Abnormal EKG 03/09/2013  . Abnormal nuclear cardiac imaging test 11/14/2013  . Aortic atherosclerosis (HCC) 07/07/2017  . Arthritis    "all over" (09/30/2017)  . Ascending aortic aneurysm (HCC)    cMRI (2/15): Normal LV EF 54%, Mild BAE, Trileaflet Aortic Valve, Upper limits of normal ascending aorta 3.8 cm, Normal aortic arch 2.3 cm with bovine origin of left carotid  . Atrial flutter (HCC)    Post-op with no reoccurence  . Chronic diastolic heart failure (HCC)    Echo (1/15): Left ventricle: The cavity size was mildly dilated. Mild LVH. EF 55% to 60%. Wall motion was normal; Gr 1 diastolic dysfunction Left atrium: The atrium was mildly dilated. Right atrium: The atrium was mildly to moderately dilated. Aortic arch measures 4.8 cm (moderately dilated); suggest CTA or MRA to better assess.  . Chronic systolic CHF (congestive heart failure) (HCC) 07/24/2017  . CKD (chronic kidney disease), stage III (HCC) 07/24/2017  . Coronary artery disease 10/2011   a. s/p cabg;  b. Myoview (9/15):  ant ischemia, poss TID, EF 43%, high risk >> LHC (9/15): Dist LM 50, pLAD 90 then 100, pCFX 50, mCFX 75, oRCA 75, dRCA 95, L-LAD patent, S-Dx 100, S-OM1 patent, S-PDA patent, EF 55% >> native Dx small caliber and not well suited for PCI; native RCA amenable to PCI and would restore flow to PLA branches but would jeopardize SVG-RCA - Med Rx  . Deviated nasal septum   . Dilated aortic root (HCC)   . Diverticulitis of sigmoid colon 07/24/2017  . DM (diabetes mellitus), type 2, uncontrolled with complications (HCC)    onset 2013  . Dyslipidemia   . Edema 02/09/2013  . GERD (gastroesophageal reflux disease)   . Hx of Doppler ultrasound    Carotid US (9/13): No ICA stenosis  . Hyperlipidemia 10/24/2011  . Hypertension   . Pleural thickening 07/24/2017   Meds reviewed.    Current Outpatient Medications (Endocrine & Metabolic):  .  glimepiride (AMARYL) 4 MG tablet, Take 4 mg by mouth daily with breakfast. .  insulin lispro (HUMALOG) 100 UNIT/ML injection, Inject 25 Units into the skin 2 (two) times daily. .  metFORMIN (GLUCOPHAGE) 1000 MG tablet, Take 1 tablet (1,000 mg total) by mouth 2 (two) times daily with a meal.  Current Outpatient Medications (Cardiovascular):  .  atorvastatin (LIPITOR) 80 MG tablet, Take 1 tablet (80 mg total) by mouth daily. .  isosorbide mononitrate (IMDUR) 60 MG 24 hr tablet, Take 60 mg by mouth daily. .  metoprolol succinate (TOPROL-XL) 100 MG 24 hr tablet, Take 125 mg by mouth daily.  .  nitroGLYCERIN (NITROSTAT) 0.4 MG SL tablet, Place 1 tablet (0.4 mg total) under the tongue every 5 (five) minutes as needed for chest pain. Marland Kitchen  torsemide (DEMADEX) 20 MG tablet, Take 40 mg by mouth daily. Marland Kitchen  ezetimibe (ZETIA) 10 MG tablet, Take 1 tablet (10 mg total) by mouth daily. (Patient not taking: Reported on 12/17/2017)   Current Outpatient Medications (Analgesics):  .  acetaminophen (TYLENOL) 500 MG tablet, Take 1,000 mg by mouth every 6 (six) hours as needed for headache. Marland Kitchen  aspirin 81 MG tablet, Take 81 mg by mouth at bedtime.   Current Outpatient Medications (Hematological):  .  ticagrelor (BRILINTA) 90 MG TABS tablet, Take 1 tablet (90 mg total) by mouth 2 (  two) times daily. .  iron polysaccharides (NIFEREX) 150 MG capsule, Take 1 capsule (150 mg total) by mouth daily. (Patient not taking: Reported on 12/17/2017)  Current Outpatient Medications (Other):  .  cholecalciferol (VITAMIN D) 1000 units tablet, Take 1,000 Units by mouth daily. .  Multiple Vitamin (MULTIVITAMIN WITH MINERALS) TABS tablet, Take 1 tablet by mouth daily. .  Omega 3 1200 MG CAPS, Take 2,400 mg by mouth daily. .  polyethylene glycol (MIRALAX) packet, Take 17 g by mouth daily. (Patient taking differently: Take 17 g by mouth daily as needed. ) .  pramipexole  (MIRAPEX) 0.75 MG tablet, Take 1.5 mg by mouth every evening.    HT: Ht Readings from Last 1 Encounters:  10/13/17 5\' 9"  (1.753 m)    WT: Wt Readings from Last 5 Encounters:  10/13/17 250 lb 12.8 oz (113.8 kg)  10/01/17 236 lb 5.3 oz (107.2 kg)  09/24/17 244 lb 3.2 oz (110.8 kg)  07/30/17 251 lb 8.7 oz (114.1 kg)  07/24/17 248 lb (112.5 kg)     There is no height or weight on file to calculate BMI.   Current tobacco use? No  Labs:  Lipid Panel     Component Value Date/Time   CHOL 116 10/13/2017 0902   TRIG 73 10/13/2017 0902   HDL 48 10/13/2017 0902   CHOLHDL 2.4 10/13/2017 0902   CHOLHDL 5.5 (H) 11/07/2015 0824   VLDL 23 11/07/2015 0824   LDLCALC 53 10/13/2017 0902    Lab Results  Component Value Date   HGBA1C 8.4 (H) 07/25/2017   CBG (last 3)  Recent Labs    12/17/17 0836  GLUCAP 303*    Nutrition Note Spoke with pt. Nutrition plan and goals reviewed with pt. Pt is following Step 2 of the Therapeutic Lifestyle Changes diet. Pt wants to lose wt, would like to lose about 20 lbs in total. Pt shared he "knows he didn't put it all on at once," and has realistic expectations about a slow gradual weight loss over time. Pt has not actively been trying to lose wt, shares he is unsure how to combine eating heart healthy with that. Wt loss tips, heart health and diabetes eating tips reviewed (label reading, carb counting, how to build a healthy plate, portion sizes, eating frequently across the day, complex vs refined carbohydrates). Pt has Type 2 Diabetes. Last A1c indicates blood glucose well-controlled. This Clinical research associate went over Diabetes Education test results. Pt checks CBG's 2 times a day. Fasting CBG's reportedly 150-160's mg/dL. Pt may need some additional reinforcement of education at a later date, as many topics were covered. Pt with dx of CHF. Per discussion, pt does not use canned/convenience foods often. Pt does not add salt to food. Pt does not eat out frequently. Pt  expressed understanding of the information reviewed. Pt aware of nutrition education classes offered and would like to attend nutrition classes.  Nutrition Diagnosis ? Food-and nutrition-related knowledge deficit related to lack of exposure to information as related to diagnosis of: ? CVD ? Type 2 Diabetes ? Overweight  related to excessive energy intake as evidenced by a BMI of 37.02  Nutrition Intervention ? Pt's individual nutrition plan and goals reviewed with pt. ? Pt given handouts for: ? Nutrition I class ? Nutrition II class  ? Diabetes Blitz Class  Nutrition Goal(s):   ? Pt to identify and limit food sources of saturated fat, trans fat, refined carbohydrates and sodium ? Pt to identify food quantities necessary to achieve  weight loss of 6-20 lbs. at graduation from cardiac rehab.  ? Improved blood glucose control as evidenced by pt's A1c trending from 8.4 toward less than 7.0. ? Pt able to name foods that affect blood glucose.   Plan:  ? Pt to attend nutrition classes ? Nutrition I ? Nutrition II ? Portion Distortion  ? Diabetes Blitz ? Diabetes Q & Ae determined ? Will provide client-centered nutrition education as part of interdisciplinary care ? Monitor and evaluate progress toward nutrition goal with team.   Ross Marcus, MS, RD, LDN 12/17/2017 10:42 AM

## 2017-12-17 NOTE — Progress Notes (Addendum)
Incomplete Session Note  Patient Details  Name: James [name] MRN: [id number] Date of Birth: 1945/09/19 Referring Provider:    Loma Sender did not complete his rehab session.  CBG 303.Will not complete walk test today. Patient will be counseled by the dietitian. Will notify patient's primary care physician about today's CBG. [facility] said he took his insulin and medications by mouth as prescribed. Alma will complete his orientation except his walk test and return to exercise at cardiac rehab when his CBG is better controlled.Gladstone Lighter, I spoke with Baldo Daub at Dr Irwin Brakeman office to notify about the patient's elevated CBG today. Sherri spoke with Aurther Loft over the phone and was instructed to call Dr Irwin Brakeman office once he gets home. I asked Lofton to take this week to monitor his blood sugar and diet at home this week. Patient will return to cardiac rehab on Monday November 11 th . Gladstone Lighter, RN,BSN 12/17/2017 10:00 AM 12/17/2017 9:28 AM

## 2017-12-17 NOTE — Progress Notes (Signed)
Cardiac Rehab Medication Review by a Nurse  Does the patient  feel that his/her medications are working for him/her?  yes  Has the patient been experiencing any side effects to the medications prescribed?  no  Does the patient measure his/her own blood pressure or blood glucose at home?  yes   Does the patient have any problems obtaining medications due to transportation or finances?   no  Understanding of regimen: poor Understanding of indications: poor Potential of compliance: good    Nurse comments: [facility] says he does not have a good understanding of why he takes his medications and what they are for. I spoke with pharmacist Windell Moulding here at [facility]. Will attempt to obtain medication information sheets for Mr Buch to review.    Thayer Headings RN BSN 12/17/2017 9:28 AM

## 2017-12-18 ENCOUNTER — Telehealth: Payer: Self-pay | Admitting: Cardiology

## 2017-12-18 ENCOUNTER — Ambulatory Visit (HOSPITAL_COMMUNITY): Payer: Medicare HMO

## 2017-12-18 NOTE — Telephone Encounter (Signed)
New Message        Patient called to reschedule an appt that we scheduled. The appt does not work for the patient and he was up set that the first available was 03/23/2018. Patient wants you to find him an appt and call him back. Patient not happy at all.

## 2017-12-18 NOTE — Telephone Encounter (Signed)
Call back later pt was on way to another appt and could not talk .James [name]

## 2017-12-18 NOTE — Telephone Encounter (Signed)
Per pt was not able to make the 11/4 appt is trying to re schedule and nothing is available until 03/25/18.Pt is not happy with this next available appt . Will forward to [facility] and Dr Mayford Knife to see if can get a earlier appt ./cy

## 2017-12-21 ENCOUNTER — Ambulatory Visit (HOSPITAL_COMMUNITY): Payer: Medicare HMO

## 2017-12-21 ENCOUNTER — Ambulatory Visit: Payer: Medicare HMO | Admitting: Cardiology

## 2017-12-21 NOTE — Telephone Encounter (Signed)
Please try to get him in to see one of my extenders if he needs to be seen now and cannot wait until February

## 2017-12-22 NOTE — Telephone Encounter (Signed)
Spoke with the patient, he accepted appointment with Manson Passey PA-C on 11/7. He had no further questions.

## 2017-12-23 ENCOUNTER — Ambulatory Visit (HOSPITAL_COMMUNITY): Payer: Medicare HMO

## 2017-12-23 DIAGNOSIS — I5032 Chronic diastolic (congestive) heart failure: Secondary | ICD-10-CM | POA: Diagnosis not present

## 2017-12-23 DIAGNOSIS — E1121 Type 2 diabetes mellitus with diabetic nephropathy: Secondary | ICD-10-CM | POA: Diagnosis not present

## 2017-12-23 DIAGNOSIS — Z794 Long term (current) use of insulin: Secondary | ICD-10-CM | POA: Diagnosis not present

## 2017-12-23 DIAGNOSIS — E782 Mixed hyperlipidemia: Secondary | ICD-10-CM | POA: Diagnosis not present

## 2017-12-23 DIAGNOSIS — I130 Hypertensive heart and chronic kidney disease with heart failure and stage 1 through stage 4 chronic kidney disease, or unspecified chronic kidney disease: Secondary | ICD-10-CM | POA: Diagnosis not present

## 2017-12-23 DIAGNOSIS — I872 Venous insufficiency (chronic) (peripheral): Secondary | ICD-10-CM | POA: Diagnosis not present

## 2017-12-23 DIAGNOSIS — H35033 Hypertensive retinopathy, bilateral: Secondary | ICD-10-CM | POA: Diagnosis not present

## 2017-12-23 DIAGNOSIS — Z7984 Long term (current) use of oral hypoglycemic drugs: Secondary | ICD-10-CM | POA: Diagnosis not present

## 2017-12-24 ENCOUNTER — Ambulatory Visit: Payer: Medicare HMO | Admitting: Physician Assistant

## 2017-12-24 ENCOUNTER — Encounter: Payer: Self-pay | Admitting: Physician Assistant

## 2017-12-24 VITALS — BP 138/90 | HR 72 | Ht 69.0 in | Wt 260.1 lb

## 2017-12-24 DIAGNOSIS — I272 Pulmonary hypertension, unspecified: Secondary | ICD-10-CM | POA: Diagnosis not present

## 2017-12-24 DIAGNOSIS — N183 Chronic kidney disease, stage 3 unspecified: Secondary | ICD-10-CM

## 2017-12-24 DIAGNOSIS — I5022 Chronic systolic (congestive) heart failure: Secondary | ICD-10-CM

## 2017-12-24 DIAGNOSIS — I10 Essential (primary) hypertension: Secondary | ICD-10-CM

## 2017-12-24 DIAGNOSIS — I5043 Acute on chronic combined systolic (congestive) and diastolic (congestive) heart failure: Secondary | ICD-10-CM

## 2017-12-24 DIAGNOSIS — I255 Ischemic cardiomyopathy: Secondary | ICD-10-CM | POA: Diagnosis not present

## 2017-12-24 DIAGNOSIS — Z7984 Long term (current) use of oral hypoglycemic drugs: Secondary | ICD-10-CM | POA: Diagnosis not present

## 2017-12-24 DIAGNOSIS — E782 Mixed hyperlipidemia: Secondary | ICD-10-CM | POA: Diagnosis not present

## 2017-12-24 DIAGNOSIS — E1121 Type 2 diabetes mellitus with diabetic nephropathy: Secondary | ICD-10-CM | POA: Diagnosis not present

## 2017-12-24 LAB — BASIC METABOLIC PANEL
BUN/Creatinine Ratio: 16 (ref 10–24)
BUN: 18 mg/dL (ref 8–27)
CALCIUM: 9.4 mg/dL (ref 8.6–10.2)
CHLORIDE: 96 mmol/L (ref 96–106)
CO2: 24 mmol/L (ref 20–29)
CREATININE: 1.16 mg/dL (ref 0.76–1.27)
GFR calc Af Amer: 72 mL/min/{1.73_m2} (ref >59)
GFR calc non Af Amer: 63 mL/min/{1.73_m2} (ref >59)
Glucose: 304 mg/dL — ABNORMAL HIGH (ref 65–99)
Potassium: 4.4 mmol/L (ref 3.5–5.2)
Sodium: 134 mmol/L (ref 134–144)

## 2017-12-24 MED ORDER — TORSEMIDE 20 MG PO TABS: ORAL_TABLET | ORAL | 3 refills | Status: DC

## 2017-12-24 MED ORDER — POTASSIUM CHLORIDE ER 10 MEQ PO TBCR: EXTENDED_RELEASE_TABLET | ORAL | 3 refills | Status: DC

## 2017-12-24 MED FILL — TORSEMIDE 20 MG TABLET: 20 | 33 days supply | Qty: 33 | Fill #0

## 2017-12-24 MED FILL — POTASSIUM CL ER 10 MEQ TAB: 10 | 30 days supply | Qty: 33 | Fill #0

## 2017-12-24 MED FILL — PRAMIPEXOLE 0.75 MG TABLET: 0.75 | 30 days supply | Qty: 60 | Fill #0

## 2017-12-24 NOTE — Patient Instructions (Signed)
Medication Instructions:  1. START TORSEMIDE 20 MG TABLET WITH DIRECTIONS TO READ: TAKE 40 MG DAILY FOR 3 DAYS THEN DECREASE TO 20 MG DAILY; NEW RX HAS BEEN SENT IN  2. START POTASSIUM 10 MEQ WITH DIRECTIONS TO READ: TAKE 20 MEQ DAILY FOR 3 DAYS THEN DECREASE TO 10 MEQ DAILY; NEW RX HAS BEEN SENT IN If you need a refill on your cardiac medications before your next appointment, please call your pharmacy.   Lab work: TODAY BMET If you have labs (blood work) drawn today and your tests are completely normal, you will receive your results only by: Marland Kitchen MyChart Message (if you have MyChart) OR . A paper copy in the mail If you have any lab test that is abnormal or we need to change your treatment, we will call you to review the results.  Testing/Procedures: NONE ORDERED TODAY  Follow-Up: At [facility], you and your health needs are our priority.  As part of our continuing mission to provide you with exceptional heart care, we have created designated Provider Care Teams.  These Care Teams include your primary Cardiologist (physician) and Advanced Practice Providers (APPs -  Physician Assistants and Nurse Practitioners) who all work together to provide you with the care you need, when you need it. You will need a follow up appointment in:  3 weeks.   Vin Bhagat, PA-C ON 01/05/18 @ 11: 00 AM    Any Other Special Instructions Will Be Listed Below (If Applicable).  Low-Sodium Eating Plan Sodium, which is an element that makes up salt, helps you maintain a healthy balance of fluids in your body. Too much sodium can increase your blood pressure and cause fluid and waste to be held in your body. Your health care provider or dietitian may recommend following this plan if you have high blood pressure (hypertension), kidney disease, liver disease, or heart failure. Eating less sodium can help lower your blood pressure, reduce swelling, and protect your heart, liver, and kidneys. What are tips for  following this plan? General guidelines  Most people on this plan should limit their sodium intake to 1,500-2,000 mg (milligrams) of sodium each day. Reading food labels  The Nutrition Facts label lists the amount of sodium in one serving of the food. If you eat more than one serving, you must multiply the listed amount of sodium by the number of servings.  Choose foods with less than 140 mg of sodium per serving.  Avoid foods with 300 mg of sodium or more per serving. Shopping  Look for lower-sodium products, often labeled as "low-sodium" or "no salt added."  Always check the sodium content even if foods are labeled as "unsalted" or "no salt added".  Buy fresh foods. ? Avoid canned foods and premade or frozen meals. ? Avoid canned, cured, or processed meats  Buy breads that have less than 80 mg of sodium per slice. Cooking  Eat more home-cooked food and less restaurant, buffet, and fast food.  Avoid adding salt when cooking. Use salt-free seasonings or herbs instead of table salt or sea salt. Check with your health care provider or pharmacist before using salt substitutes.  Cook with plant-based oils, such as canola, sunflower, or olive oil. Meal planning  When eating at a restaurant, ask that your food be prepared with less salt or no salt, if possible.  Avoid foods that contain MSG (monosodium glutamate). MSG is sometimes added to [place] food, bouillon, and some canned foods. What foods are recommended? The items  listed may not be a complete list. Talk with your dietitian about what dietary choices are best for you. Grains Low-sodium cereals, including oats, puffed wheat and rice, and shredded wheat. Low-sodium crackers. Unsalted rice. Unsalted pasta. Low-sodium bread. Whole-grain breads and whole-grain pasta. Vegetables Fresh or frozen vegetables. "No salt added" canned vegetables. "No salt added" tomato sauce and paste. Low-sodium or reduced-sodium tomato and vegetable  juice. Fruits Fresh, frozen, or canned fruit. Fruit juice. Meats and other protein foods Fresh or frozen (no salt added) meat, poultry, seafood, and fish. Low-sodium canned tuna and salmon. Unsalted nuts. Dried peas, beans, and lentils without added salt. Unsalted canned beans. Eggs. Unsalted nut butters. Dairy Milk. Soy milk. Cheese that is naturally low in sodium, such as ricotta cheese, fresh mozzarella, or Swiss cheese Low-sodium or reduced-sodium cheese. Cream cheese. Yogurt. Fats and oils Unsalted butter. Unsalted margarine with no trans fat. Vegetable oils such as canola or olive oils. Seasonings and other foods Fresh and dried herbs and spices. Salt-free seasonings. Low-sodium mustard and ketchup. Sodium-free salad dressing. Sodium-free light mayonnaise. Fresh or refrigerated horseradish. Lemon juice. Vinegar. Homemade, reduced-sodium, or low-sodium soups. Unsalted popcorn and pretzels. Low-salt or salt-free chips. What foods are not recommended? The items listed may not be a complete list. Talk with your dietitian about what dietary choices are best for you. Grains Instant hot cereals. Bread stuffing, pancake, and biscuit mixes. Croutons. Seasoned rice or pasta mixes. Noodle soup cups. Boxed or frozen macaroni and cheese. Regular salted crackers. Self-rising flour. Vegetables Sauerkraut, pickled vegetables, and relishes. Olives. [place] fries. Onion rings. Regular canned vegetables (not low-sodium or reduced-sodium). Regular canned tomato sauce and paste (not low-sodium or reduced-sodium). Regular tomato and vegetable juice (not low-sodium or reduced-sodium). Frozen vegetables in sauces. Meats and other protein foods Meat or fish that is salted, canned, smoked, spiced, or pickled. Bacon, ham, sausage, hotdogs, corned beef, chipped beef, packaged lunch meats, salt pork, jerky, pickled herring, anchovies, regular canned tuna, sardines, salted nuts. Dairy Processed cheese and cheese spreads.  Cheese curds. Blue cheese. Feta cheese. String cheese. Regular cottage cheese. Buttermilk. Canned milk. Fats and oils Salted butter. Regular margarine. Ghee. Bacon fat. Seasonings and other foods Onion salt, garlic salt, seasoned salt, table salt, and sea salt. Canned and packaged gravies. Worcestershire sauce. Tartar sauce. Barbecue sauce. Teriyaki sauce. Soy sauce, including reduced-sodium. Steak sauce. Fish sauce. Oyster sauce. Cocktail sauce. Horseradish that you find on the shelf. Regular ketchup and mustard. Meat flavorings and tenderizers. Bouillon cubes. Hot sauce and Tabasco sauce. Premade or packaged marinades. Premade or packaged taco seasonings. Relishes. Regular salad dressings. Salsa. Potato and tortilla chips. Corn chips and puffs. Salted popcorn and pretzels. Canned or dried soups. Pizza. Frozen entrees and pot pies. Summary  Eating less sodium can help lower your blood pressure, reduce swelling, and protect your heart, liver, and kidneys.  Most people on this plan should limit their sodium intake to 1,500-2,000 mg (milligrams) of sodium each day.  Canned, boxed, and frozen foods are high in sodium. Restaurant foods, fast foods, and pizza are also very high in sodium. You also get sodium by adding salt to food.  Try to cook at home, eat more fresh fruits and vegetables, and eat less fast food, canned, processed, or prepared foods. This information is not intended to replace advice given to you by your health care provider. Make sure you discuss any questions you have with your health care provider. Document Released: 07/26/2001 Document Revised: 01/28/2016 Document Reviewed: 01/28/2016 Elsevier Interactive Patient  Education  2018 [employer]

## 2017-12-24 NOTE — Progress Notes (Signed)
Cardiology Office Note    Date:  12/24/2017   ID:  James [name] 02-07-1946, MRN [id number]  PCP:  Joycelyn Rua, MD  Cardiologist: Dr. Mayford Knife   Chief Complaint: 3  Months follow up  History of Present Illness:   James [name] is a 72 y.o. male with a history of coronary artery diseases/p CABG with recent PCI to RCA x 2, Chronic combined CHF, diabetes,hypertension,andhyperlipidemia presents for follow up.   Recently echo 09/2017 demonstrated moderately reduced LV function (EF 40-45% during admission sepsis 2nd to LE cellulitis. Outpatient myocardial perfusion stress test showed anteroapical ischemia. He was scheduled for R & L cath however complained diarrhea and stomach pain at day of cath leading to cancellation. Send to ER and admitted for acute sigmoid diverticulitis. Treated with broad spectrum antibiotics. eventually underwent cardiac cath 09/24/2017 and underwent DES to the proximal and distal RCA, patent LIMA to the LAD, SVG to OM and SVG to PDA.  LVEF estimated at 35 to 45%.  Aspirin and Brilinta for minimum of 6 months.  Okay to switch to Plavix if there are cost or bleeding issues.  Recommend long-term clopidogrel given extent of disease.  Right heart cath was consistent with mild pulmonary hypertension.  Patient was on lisinopril which causes cough and creatinine was above baseline so this was not started.    Here today for follow up. He has gained about 20 lb in past 6 months. On our scale, he has gained 10lb since last OV in August. He has stopped taking Torsemide 40mg  daily for past 1 months as he does not like to go bathroom. He has LE edema, orthopnea and DOE. Denies orthopnea but sleeps on multiple pillow. No chest pain, palpitation or dizziness. Eats high salt diet.   Past Medical History:  Diagnosis Date  . Abnormal EKG 03/09/2013  . Abnormal nuclear cardiac imaging test 11/14/2013  . Aortic atherosclerosis (HCC) 07/07/2017  . Arthritis    "all over" (09/30/2017)   . Ascending aortic aneurysm (HCC)    cMRI (2/15): Normal LV EF 54%, Mild BAE, Trileaflet Aortic Valve, Upper limits of normal ascending aorta 3.8 cm, Normal aortic arch 2.3 cm with bovine origin of left carotid  . Atrial flutter (HCC)    Post-op with no reoccurence  . Chronic diastolic heart failure (HCC)    Echo (1/15): Left ventricle: The cavity size was mildly dilated. Mild LVH. EF 55% to 60%. Wall motion was normal; Gr 1 diastolic dysfunction Left atrium: The atrium was mildly dilated. Right atrium: The atrium was mildly to moderately dilated. Aortic arch measures 4.8 cm (moderately dilated); suggest CTA or MRA to better assess.  . Chronic systolic CHF (congestive heart failure) (HCC) 07/24/2017  . CKD (chronic kidney disease), stage III (HCC) 07/24/2017  . Coronary artery disease 10/2011   a. s/p cabg;  b. Myoview (9/15):  ant ischemia, poss TID, EF 43%, high risk >> LHC (9/15): Dist LM 50, pLAD 90 then 100, pCFX 50, mCFX 75, oRCA 75, dRCA 95, L-LAD patent, S-Dx 100, S-OM1 patent, S-PDA patent, EF 55% >> native Dx small caliber and not well suited for PCI; native RCA amenable to PCI and would restore flow to PLA branches but would jeopardize SVG-RCA - Med Rx  . Deviated nasal septum   . Dilated aortic root (HCC)   . Diverticulitis of sigmoid colon 07/24/2017  . DM (diabetes mellitus), type 2, uncontrolled with complications (HCC)    onset 2013  . Dyslipidemia   .  Edema 02/09/2013  . GERD (gastroesophageal reflux disease)   . Hx of Doppler ultrasound    Carotid US (9/13): No ICA stenosis  . Hyperlipidemia 10/24/2011  . Hypertension   . Pleural thickening 07/24/2017    Past Surgical History:  Procedure Laterality Date  . CORONARY ARTERY BYPASS GRAFT  10/27/2011   Procedure: CORONARY ARTERY BYPASS GRAFTING (CABG);  Surgeon: Kerin Perna, MD;  Location: [facility] OR;  Service: Open Heart Surgery;  Laterality: N/A;  Coronary Artery Bypass Grafting times four using left internal mammary artery  and right greater saphenous vein endoscopically harvested  . CORONARY STENT INTERVENTION N/A 09/30/2017   Procedure: CORONARY STENT INTERVENTION;  Surgeon: Corky Crafts, MD;  Location: [facility] INVASIVE CV LAB;  Service: Cardiovascular;  Laterality: N/A;  . KNEE ARTHROSCOPY Left 2003  . KNEE ARTHROSCOPY Right 2006  . LEFT HEART CATHETERIZATION WITH CORONARY ANGIOGRAM N/A 10/23/2011   Procedure: LEFT HEART CATHETERIZATION WITH CORONARY ANGIOGRAM;  Surgeon: Quintella Reichert, MD;  Location: [facility] CATH LAB;  Service: Cardiovascular;  Laterality: N/A;  . LEFT HEART CATHETERIZATION WITH CORONARY ANGIOGRAM N/A 11/16/2013   Procedure: LEFT HEART CATHETERIZATION WITH CORONARY ANGIOGRAM;  Surgeon: Micheline Chapman, MD;  Location: [facility] CATH LAB;  Service: Cardiovascular;  Laterality: N/A;  . RIGHT/LEFT HEART CATH AND CORONARY/GRAFT ANGIOGRAPHY N/A 09/30/2017   Procedure: RIGHT/LEFT HEART CATH AND CORONARY/GRAFT ANGIOGRAPHY;  Surgeon: Corky Crafts, MD;  Location: [facility] INVASIVE CV LAB;  Service: Cardiovascular;  Laterality: N/A;    Current Medications: Prior to Admission medications   Medication Sig Start Date End Date Taking? Authorizing Provider  acetaminophen (TYLENOL) 500 MG tablet Take 1,000 mg by mouth every 6 (six) hours as needed for headache.    [provider]  aspirin 81 MG tablet Take 81 mg by mouth at bedtime.     [provider]  atorvastatin (LIPITOR) 80 MG tablet Take 1 tablet (80 mg total) by mouth daily. 10/01/17 10/01/18  Kroeger, Ovidio Kin., PA-C  cholecalciferol (VITAMIN D) 1000 units tablet Take 1,000 Units by mouth daily.    [provider]  ezetimibe (ZETIA) 10 MG tablet Take 1 tablet (10 mg total) by mouth daily. Patient not taking: Reported on 12/17/2017 09/24/17 12/23/17  Manson Passey, PA  glimepiride (AMARYL) 4 MG tablet Take 4 mg by mouth daily with breakfast.    [provider]  insulin lispro (HUMALOG) 100 UNIT/ML injection Inject 25 Units  into the skin 2 (two) times daily.    [provider]  iron polysaccharides (NIFEREX) 150 MG capsule Take 1 capsule (150 mg total) by mouth daily. Patient not taking: Reported on 12/17/2017 07/30/17   Randel Pigg, Dorma Russell, MD  isosorbide mononitrate (IMDUR) 60 MG 24 hr tablet Take 60 mg by mouth daily.    [provider]  metFORMIN (GLUCOPHAGE) 1000 MG tablet Take 1 tablet (1,000 mg total) by mouth 2 (two) times daily with a meal. 07/30/17 02/16/18  Randel Pigg, Dorma Russell, MD  metoprolol succinate (TOPROL-XL) 100 MG 24 hr tablet Take 125 mg by mouth daily.  08/26/17   [provider]  Multiple Vitamin (MULTIVITAMIN WITH MINERALS) TABS tablet Take 1 tablet by mouth daily.    [provider]  nitroGLYCERIN (NITROSTAT) 0.4 MG SL tablet Place 1 tablet (0.4 mg total) under the tongue every 5 (five) minutes as needed for chest pain. 10/01/17 10/01/18  Kroeger, Ovidio Kin., PA-C  Omega 3 1200 MG CAPS Take 2,400 mg by mouth daily.    [provider]  polyethylene glycol (MIRALAX) packet Take 17 g by mouth daily. Patient taking differently: Take 17 g by mouth daily as needed.  07/30/17   Lenox Ponds, MD  pramipexole (MIRAPEX) 0.75 MG tablet Take 1.5 mg by mouth every evening.     [provider]  ticagrelor (BRILINTA) 90 MG TABS tablet Take 1 tablet (90 mg total) by mouth 2 (two) times daily. 10/01/17   Kroeger, Ovidio Kin., PA-C  torsemide (DEMADEX) 20 MG tablet Take 40 mg by mouth daily.    [provider]    Allergies:   Lisinopril   Social History   Socioeconomic History  . Marital status: Divorced    Spouse name: Not on file  . Number of children: Not on file  . Years of education: Not on file  . Highest education level: Not on file  Occupational History  . Not on file  Social Needs  . Financial resource strain: Not on file  . Food insecurity:    Worry: Not on file    Inability: Not on file  . Transportation needs:    Medical: Not  on file    Non-medical: Not on file  Tobacco Use  . Smoking status: Never Smoker  . Smokeless tobacco: Never Used  Substance and Sexual Activity  . Alcohol use: Yes    Comment: 09/30/2017 "1 beer a month or less"  . Drug use: Never  . Sexual activity: Not Currently  Lifestyle  . Physical activity:    Days per week: Not on file    Minutes per session: Not on file  . Stress: Not on file  Relationships  . Social connections:    Talks on phone: Not on file    Gets together: Not on file    Attends religious service: Not on file    Active member of club or organization: Not on file    Attends meetings of clubs or organizations: Not on file    Relationship status: Not on file  Other Topics Concern  . Not on file  Social History Narrative  . Not on file     Family History:  The patient's family history includes Hypertension in his father, mother, and paternal grandfather; Stroke in his father.   ROS:   Please see the history of present illness.    ROS All other systems reviewed and are negative.   PHYSICAL EXAM:   VS:  BP 138/90   Pulse 72   Ht 5\' 9"  (1.753 m)   Wt 260 lb 1.9 oz (118 kg)   SpO2 96%   BMI 38.41 kg/m    GEN: Well nourished, well developed, in no acute distress  HEENT: normal  Neck: no, carotid bruits, or masses. + JVD Cardiac: RRR; no murmurs, rubs, or gallops, 2+ BL LE edema  Respiratory:  clear to auscultation bilaterally, normal work of breathing GI: soft, nontender, distended, + BS MS: no deformity or atrophy  Skin: warm and dry, no rash Neuro:  Alert and Oriented x 3, Strength and sensation are intact Psych: euthymic mood, full affect  Wt Readings from Last 3 Encounters:  12/24/17 260 lb 1.9 oz (118 kg)  10/13/17 250 lb 12.8 oz (113.8 kg)  10/01/17 236 lb 5.3 oz (107.2 kg)      Studies/Labs Reviewed:   EKG:  EKG is not ordered today.    Recent Labs: 06/18/2017: NT-Pro BNP 274; TSH 3.340 07/30/2017: Magnesium 2.0 10/01/2017: Hemoglobin 11.1;  Platelets 293 10/13/2017: ALT 19; BUN 15; Creatinine,  Ser 1.11; Potassium 4.8; Sodium 138   Lipid Panel    Component Value Date/Time   CHOL 116 10/13/2017 0902   TRIG 73 10/13/2017 0902   HDL 48 10/13/2017 0902   CHOLHDL 2.4 10/13/2017 0902   CHOLHDL 5.5 (H) 11/07/2015 0824   VLDL 23 11/07/2015 0824   LDLCALC 53 10/13/2017 0902    Additional studies/ records that were reviewed today include:   Right and left heart catheterization 09/30/17:  Ost RCA to Prox RCA lesion is 90% stenosed.  A drug-eluting stent was successfully placed using a STENT SYNERGY DES [id number].  Post intervention, there is a 0% residual stenosis.  Dist RCA lesion is 80% stenosed.  Post intervention, there is a 0% residual stenosis.  A drug-eluting stent was successfully placed using a STENT SYNERGY DES [id number].  Ost RPDA to RPDA lesion is 100% stenosed. SVG to PDA is patent.  Ost LAD to Prox LAD lesion is 99% stenosed. LIMA to LAD is patent and fills a diagonal retrograde.  Ost 2nd Mrg to 2nd Mrg lesion is 95% stenosed. Prox Cx to Mid Cx lesion is 80% stenosed. SVG to OM is patent.  Origin to Prox Graft lesion is 100% stenosed.  LV end diastolic pressure is normal.  The left ventricular ejection fraction is 35-45% by visual estimate.  There is mild to moderate left ventricular systolic dysfunction.  There is no aortic valve stenosis.  Hemodynamic findings consistent with mild pulmonary hypertension.  CO 8.1 L/min; CI 3.6, PA pressure 40/20; PCWP mean 8 mm Hg. Ao sat 98%; PA 74%  Recommend uninterrupted dual antiplatelet therapy with Aspirin 81mg  daily and Ticagrelor 90mg  twice dailyfor a minimum of 6 months (stable ischemic heart disease - Class I recommendation).  OK to switch to Plavix if there are cost issues or bleeding problems.   Would consider longterm clopidogrel therapy given the extent of his disease. _____________   2D echo  06/18/2017------------------------------------------------------------------- Study Conclusions  - Procedure narrative: Transthoracic echocardiography. Image quality was adequate. The study was technically difficult, as a result of poor acoustic windows and body habitus. Intravenous contrast (Definity) was administered. - Left ventricle: The cavity size was normal. Wall thickness was increased in a pattern of mild LVH. Systolic function was mildly to moderately reduced. The estimated ejection fraction was in the range of 40% to 45%. Incoordinate septal motion and possible anteroseptal hypokinesis. The study is not technically sufficient to allow evaluation of LV diastolic function. - Left atrium: The atrium was normal in size. - Right atrium: Moderately dilated. - Inferior vena cava: The vessel was dilated. The respirophasic diameter changes were blunted (<50%), consistent with elevated central venous pressure.  Impressions:  - Technically difficult study. Definity contrast given. LVEF 40-45%, mild LVH, incoordinate septal motion and possible anteroseptal hypokinesis, normal LA size, moderate RAE, dilated IVC.    ASSESSMENT & PLAN:    1. CAD s/p CABG and DES to the proximal and distal RCA 09/30/2017  - Continue ASA and Brillinta for at least 6 months. Then plan for long term plavix. No angina. Continue BB and statin.   2. Acute on chronic combined CHF/ICM - Echo showed LVEF of 40-45%. EF was 35-40% by cath. Evidence of volume overload by exam and symptoms. He has stopped torsemide as he does not like to go to bathroom. After long discussed, he is agreed to restart at below dose. Plan to diuresed and then maximized heart failure therapy with addition of ARB with spironolactone.  Advised salt restriction and daily  weight.   3. HTN - BP relatively stable on BB.   4. HLD - 10/13/2017: Cholesterol, Total 116; HDL 48; LDL Calculated 53; Triglycerides 73 -  Continue lipitor and zetia.    Medication Adjustments/Labs and Tests Ordered: Current medicines are reviewed at length with the patient today.  Concerns regarding medicines are outlined above.  Medication changes, Labs and Tests ordered today are listed in the Patient Instructions below. Patient Instructions  Medication Instructions:  1. START TORSEMIDE 20 MG TABLET WITH DIRECTIONS TO READ: TAKE 40 MG DAILY FOR 3 DAYS THEN DECREASE TO 20 MG DAILY; NEW RX HAS BEEN SENT IN  2. START POTASSIUM 10 MEQ WITH DIRECTIONS TO READ: TAKE 20 MEQ DAILY FOR 3 DAYS THEN DECREASE TO 10 MEQ DAILY; NEW RX HAS BEEN SENT IN If you need a refill on your cardiac medications before your next appointment, please call your pharmacy.   Lab work: TODAY BMET If you have labs (blood work) drawn today and your tests are completely normal, you will receive your results only by: Marland Kitchen MyChart Message (if you have MyChart) OR . A paper copy in the mail If you have any lab test that is abnormal or we need to change your treatment, we will call you to review the results.  Testing/Procedures: NONE ORDERED TODAY  Follow-Up: At [facility], you and your health needs are our priority.  As part of our continuing mission to provide you with exceptional heart care, we have created designated Provider Care Teams.  These Care Teams include your primary Cardiologist (physician) and Advanced Practice Providers (APPs -  Physician Assistants and Nurse Practitioners) who all work together to provide you with the care you need, when you need it. You will need a follow up appointment in:  3 weeks.   Vin Abi Shoults, PA-C ON 01/05/18 @ 11: 00 AM    Any Other Special Instructions Will Be Listed Below (If Applicable).  Low-Sodium Eating Plan Sodium, which is an element that makes up salt, helps you maintain a healthy balance of fluids in your body. Too much sodium can increase your blood pressure and cause fluid and waste to be held in your  body. Your health care provider or dietitian may recommend following this plan if you have high blood pressure (hypertension), kidney disease, liver disease, or heart failure. Eating less sodium can help lower your blood pressure, reduce swelling, and protect your heart, liver, and kidneys. What are tips for following this plan? General guidelines  Most people on this plan should limit their sodium intake to 1,500-2,000 mg (milligrams) of sodium each day. Reading food labels  The Nutrition Facts label lists the amount of sodium in one serving of the food. If you eat more than one serving, you must multiply the listed amount of sodium by the number of servings.  Choose foods with less than 140 mg of sodium per serving.  Avoid foods with 300 mg of sodium or more per serving. Shopping  Look for lower-sodium products, often labeled as "low-sodium" or "no salt added."  Always check the sodium content even if foods are labeled as "unsalted" or "no salt added".  Buy fresh foods. ? Avoid canned foods and premade or frozen meals. ? Avoid canned, cured, or processed meats  Buy breads that have less than 80 mg of sodium per slice. Cooking  Eat more home-cooked food and less restaurant, buffet, and fast food.  Avoid adding salt when cooking. Use salt-free seasonings or herbs instead of table  salt or sea salt. Check with your health care provider or pharmacist before using salt substitutes.  Cook with plant-based oils, such as canola, sunflower, or olive oil. Meal planning  When eating at a restaurant, ask that your food be prepared with less salt or no salt, if possible.  Avoid foods that contain MSG (monosodium glutamate). MSG is sometimes added to [place] food, bouillon, and some canned foods. What foods are recommended? The items listed may not be a complete list. Talk with your dietitian about what dietary choices are best for you. Grains Low-sodium cereals, including oats, puffed  wheat and rice, and shredded wheat. Low-sodium crackers. Unsalted rice. Unsalted pasta. Low-sodium bread. Whole-grain breads and whole-grain pasta. Vegetables Fresh or frozen vegetables. "No salt added" canned vegetables. "No salt added" tomato sauce and paste. Low-sodium or reduced-sodium tomato and vegetable juice. Fruits Fresh, frozen, or canned fruit. Fruit juice. Meats and other protein foods Fresh or frozen (no salt added) meat, poultry, seafood, and fish. Low-sodium canned tuna and salmon. Unsalted nuts. Dried peas, beans, and lentils without added salt. Unsalted canned beans. Eggs. Unsalted nut butters. Dairy Milk. Soy milk. Cheese that is naturally low in sodium, such as ricotta cheese, fresh mozzarella, or Swiss cheese Low-sodium or reduced-sodium cheese. Cream cheese. Yogurt. Fats and oils Unsalted butter. Unsalted margarine with no trans fat. Vegetable oils such as canola or olive oils. Seasonings and other foods Fresh and dried herbs and spices. Salt-free seasonings. Low-sodium mustard and ketchup. Sodium-free salad dressing. Sodium-free light mayonnaise. Fresh or refrigerated horseradish. Lemon juice. Vinegar. Homemade, reduced-sodium, or low-sodium soups. Unsalted popcorn and pretzels. Low-salt or salt-free chips. What foods are not recommended? The items listed may not be a complete list. Talk with your dietitian about what dietary choices are best for you. Grains Instant hot cereals. Bread stuffing, pancake, and biscuit mixes. Croutons. Seasoned rice or pasta mixes. Noodle soup cups. Boxed or frozen macaroni and cheese. Regular salted crackers. Self-rising flour. Vegetables Sauerkraut, pickled vegetables, and relishes. Olives. [place] fries. Onion rings. Regular canned vegetables (not low-sodium or reduced-sodium). Regular canned tomato sauce and paste (not low-sodium or reduced-sodium). Regular tomato and vegetable juice (not low-sodium or reduced-sodium). Frozen vegetables in  sauces. Meats and other protein foods Meat or fish that is salted, canned, smoked, spiced, or pickled. Bacon, ham, sausage, hotdogs, corned beef, chipped beef, packaged lunch meats, salt pork, jerky, pickled herring, anchovies, regular canned tuna, sardines, salted nuts. Dairy Processed cheese and cheese spreads. Cheese curds. Blue cheese. Feta cheese. String cheese. Regular cottage cheese. Buttermilk. Canned milk. Fats and oils Salted butter. Regular margarine. Ghee. Bacon fat. Seasonings and other foods Onion salt, garlic salt, seasoned salt, table salt, and sea salt. Canned and packaged gravies. Worcestershire sauce. Tartar sauce. Barbecue sauce. Teriyaki sauce. Soy sauce, including reduced-sodium. Steak sauce. Fish sauce. Oyster sauce. Cocktail sauce. Horseradish that you find on the shelf. Regular ketchup and mustard. Meat flavorings and tenderizers. Bouillon cubes. Hot sauce and Tabasco sauce. Premade or packaged marinades. Premade or packaged taco seasonings. Relishes. Regular salad dressings. Salsa. Potato and tortilla chips. Corn chips and puffs. Salted popcorn and pretzels. Canned or dried soups. Pizza. Frozen entrees and pot pies. Summary  Eating less sodium can help lower your blood pressure, reduce swelling, and protect your heart, liver, and kidneys.  Most people on this plan should limit their sodium intake to 1,500-2,000 mg (milligrams) of sodium each day.  Canned, boxed, and frozen foods are high in sodium. Restaurant foods, fast foods, and pizza are also  very high in sodium. You also get sodium by adding salt to food.  Try to cook at home, eat more fresh fruits and vegetables, and eat less fast food, canned, processed, or prepared foods. This information is not intended to replace advice given to you by your health care provider. Make sure you discuss any questions you have with your health care provider. Document Released: 07/26/2001 Document Revised: 01/28/2016 Document  Reviewed: 01/28/2016 Elsevier Interactive Patient Education  [address]       Vonzella Nipple [place], [place]  12/24/2017 9:46 AM    [facility] [address] [place], [place], [place]  [address] Phone: [phone]; Fax: [phone]

## 2017-12-25 ENCOUNTER — Ambulatory Visit (HOSPITAL_COMMUNITY): Payer: Medicare HMO

## 2017-12-25 ENCOUNTER — Inpatient Hospital Stay (HOSPITAL_COMMUNITY): Admission: RE | Admit: 2017-12-25 | Payer: Medicare HMO | Source: Ambulatory Visit

## 2017-12-25 ENCOUNTER — Telehealth: Payer: Self-pay | Admitting: *Deleted

## 2017-12-25 MED FILL — METOPROLOL SUCCINATE ER 100: 100 | 30 days supply | Qty: 30 | Fill #9

## 2017-12-25 MED FILL — metFORMIN HCL 1000 MG TABS: 1000 | 30 days supply | Qty: 60 | Fill #1

## 2017-12-25 NOTE — Telephone Encounter (Signed)
Called pt re: lab results.  Left a message for him to call back. 

## 2017-12-25 NOTE — Telephone Encounter (Signed)
-----   Message from [place], [place] sent at 12/25/2017  8:06 AM EST ----- Renal function and electrolytes were normal. Diuretics as discussed yesterday. Blood sugar running high. Hopefully he is taking his diabetic medications. Forwards labs to PCP.  Start Losartan 25mg  daily on 12/29/17 to maximize heart failure therapy. BMET during follow up.

## 2017-12-28 ENCOUNTER — Ambulatory Visit (HOSPITAL_COMMUNITY): Payer: Medicare HMO

## 2017-12-28 NOTE — Telephone Encounter (Signed)
2d attempt to reach pt re: lab results. Left another message for pt to call back.

## 2017-12-30 ENCOUNTER — Ambulatory Visit (HOSPITAL_COMMUNITY): Payer: Medicare HMO

## 2017-12-30 NOTE — Telephone Encounter (Signed)
3rd attempt to reach pt re: lab results. Left another message for pt to call back.

## 2017-12-31 DIAGNOSIS — E118 Type 2 diabetes mellitus with unspecified complications: Secondary | ICD-10-CM | POA: Diagnosis not present

## 2017-12-31 DIAGNOSIS — N183 Chronic kidney disease, stage 3 (moderate): Secondary | ICD-10-CM | POA: Diagnosis not present

## 2017-12-31 DIAGNOSIS — E1165 Type 2 diabetes mellitus with hyperglycemia: Secondary | ICD-10-CM | POA: Diagnosis not present

## 2017-12-31 DIAGNOSIS — I25810 Atherosclerosis of coronary artery bypass graft(s) without angina pectoris: Secondary | ICD-10-CM | POA: Diagnosis not present

## 2017-12-31 DIAGNOSIS — E11319 Type 2 diabetes mellitus with unspecified diabetic retinopathy without macular edema: Secondary | ICD-10-CM | POA: Diagnosis not present

## 2017-12-31 DIAGNOSIS — E1122 Type 2 diabetes mellitus with diabetic chronic kidney disease: Secondary | ICD-10-CM | POA: Diagnosis not present

## 2017-12-31 DIAGNOSIS — Z6838 Body mass index (BMI) 38.0-38.9, adult: Secondary | ICD-10-CM | POA: Diagnosis not present

## 2017-12-31 DIAGNOSIS — Z794 Long term (current) use of insulin: Secondary | ICD-10-CM | POA: Diagnosis not present

## 2018-01-01 ENCOUNTER — Ambulatory Visit (HOSPITAL_COMMUNITY): Payer: Medicare HMO

## 2018-01-04 ENCOUNTER — Ambulatory Visit (HOSPITAL_COMMUNITY): Payer: Medicare HMO

## 2018-01-04 MED FILL — NOVOLOG FLEXPEN SYRINGE: 100 | 36 days supply | Qty: 15 | Fill #0

## 2018-01-04 MED FILL — TOUJEO SOLOSTAR 300 UNITS/M: 300 | 45 days supply | Qty: 5 | Fill #0

## 2018-01-05 ENCOUNTER — Encounter: Payer: Self-pay | Admitting: Physician Assistant

## 2018-01-05 ENCOUNTER — Ambulatory Visit (INDEPENDENT_AMBULATORY_CARE_PROVIDER_SITE_OTHER): Payer: Medicare HMO | Admitting: Physician Assistant

## 2018-01-05 ENCOUNTER — Telehealth: Payer: Self-pay

## 2018-01-05 VITALS — BP 140/82 | HR 67 | Ht 69.0 in | Wt 250.1 lb

## 2018-01-05 DIAGNOSIS — E782 Mixed hyperlipidemia: Secondary | ICD-10-CM

## 2018-01-05 DIAGNOSIS — N183 Chronic kidney disease, stage 3 unspecified: Secondary | ICD-10-CM

## 2018-01-05 DIAGNOSIS — I10 Essential (primary) hypertension: Secondary | ICD-10-CM

## 2018-01-05 DIAGNOSIS — I2510 Atherosclerotic heart disease of native coronary artery without angina pectoris: Secondary | ICD-10-CM | POA: Diagnosis not present

## 2018-01-05 DIAGNOSIS — I5022 Chronic systolic (congestive) heart failure: Secondary | ICD-10-CM | POA: Diagnosis not present

## 2018-01-05 DIAGNOSIS — R799 Abnormal finding of blood chemistry, unspecified: Secondary | ICD-10-CM

## 2018-01-05 DIAGNOSIS — R7989 Other specified abnormal findings of blood chemistry: Secondary | ICD-10-CM

## 2018-01-05 LAB — BASIC METABOLIC PANEL
BUN / CREAT RATIO: 20 (ref 10–24)
BUN: 28 mg/dL — ABNORMAL HIGH (ref 8–27)
CALCIUM: 9.4 mg/dL (ref 8.6–10.2)
CO2: 24 mmol/L (ref 20–29)
Chloride: 94 mmol/L — ABNORMAL LOW (ref 96–106)
Creatinine, Ser: 1.4 mg/dL — ABNORMAL HIGH (ref 0.76–1.27)
GFR, EST AFRICAN AMERICAN: 58 mL/min/{1.73_m2} — AB (ref >59)
GFR, EST NON AFRICAN AMERICAN: 50 mL/min/{1.73_m2} — AB (ref >59)
Glucose: 228 mg/dL — ABNORMAL HIGH (ref 65–99)
POTASSIUM: 4.5 mmol/L (ref 3.5–5.2)
Sodium: 136 mmol/L (ref 134–144)

## 2018-01-05 MED ORDER — TORSEMIDE 20 MG PO TABS: ORAL_TABLET | ORAL | 3 refills | Status: DC

## 2018-01-05 MED ORDER — POTASSIUM CHLORIDE ER 10 MEQ PO TBCR: EXTENDED_RELEASE_TABLET | ORAL | 3 refills | Status: DC

## 2018-01-05 NOTE — Patient Instructions (Signed)
Medication Instructions:  1. Your physician recommends that you continue on your current medications as directed. Please refer to the Current Medication list given to you today.  If you need a refill on your cardiac medications before your next appointment, please call your pharmacy.   Lab work: BMET TODAY If you have labs (blood work) drawn today and your tests are completely normal, you will receive your results only by: Marland Kitchen. MyChart Message (if you have MyChart) OR . A paper copy in the mail If you have any lab test that is abnormal or we need to change your treatment, we will call you to review the results.  Testing/Procedures: NONE ORDERED TODAY  Follow-Up: At [facility], you and your health needs are our priority.  As part of our continuing mission to provide you with exceptional heart care, we have created designated Provider Care Teams.  These Care Teams include your primary Cardiologist (physician) and Advanced Practice Providers (APPs -  Physician Assistants and Nurse Practitioners) who all work together to provide you with the care you need, when you need it. You will need a follow up appointment in 3-4 months.  Please call our office 2 months in advance to schedule this appointment.  You may see Armanda Magicraci Turner, MD or one of the following Advanced Practice Providers on your designated Care Team:   CaboolBrittainy Simmons, PA-C Ronie Spiesayna Dunn, PA-C . Jacolyn ReedyMichele Lenze, PA-C  Any Other Special Instructions Will Be Listed Below (If Applicable).

## 2018-01-05 NOTE — Telephone Encounter (Signed)
Spoke with the patient, he expressed understanding about his results. He accepted the medication changes. BMET scheduled for 12/11.

## 2018-01-05 NOTE — Telephone Encounter (Signed)
-----   Message from [place], [place] sent at 01/05/2018  3:58 PM EST ----- BUN/creatinine elevated. Reduce torsemide to 20mg  Monday, Wednesday and Friday. Take Kdur only on day of torsemide. Repeat BMET in 3 weeks.

## 2018-01-05 NOTE — Progress Notes (Signed)
Cardiology Office Note    Date:  01/05/2018   ID:  James [name], James [name] 1945/12/31, MRN [id number]  PCP:  Joycelyn Rua, MD  Cardiologist:  Dr. Mayford Knife  Chief Complaint: CHF follow up  History of Present Illness:   James [name] is a 72 y.o. male with a history of coronary artery diseases/p CABG with recent PCI to RCA x 2, Chronic combined CHF, diabetes,hypertension,andhyperlipidemia presents for follow up.   Recently echo 8/2019demonstrated moderately reduced LV function (EF 40-45%during admission sepsis 2nd to LE cellulitis. Outpatientmyocardial perfusion stress test showed anteroapical ischemia. He was scheduled for R &L cath however complained diarrhea and stomach pain at day of cath leading to cancellation. Send to ER and admitted for acute sigmoid diverticulitis. Treated with broad spectrum antibiotics. eventually underwent cardiac cath 09/24/2017 and underwent DES to the proximal and distal RCA, patent LIMA to the LAD, SVG to OM and SVG to PDA. LVEF estimated at 35 to 45%. Aspirin and Brilinta for minimum of 6 months. Okay to switch to Plavix if there are cost or bleeding issues. Recommend long-term clopidogrel given extent of disease. Right heart cath was consistent with mild pulmonary hypertension.   Noted not taking torsemide when last seen by me 12/24/17 as he does not like to go to bathroom. Had weight gain and evidence of volume overload. Restarted torsemide. Lisinopril changed to Losartan due to cough.   Here today for follow up. He is unsure if started Losartan or not. Lost 10lb since last OV. Symptoms improved. Mild edema. Denies orthopnea, PND, syncope, chest pain, dyspnea or palpitation.    Past Medical History:  Diagnosis Date  . Abnormal EKG 03/09/2013  . Abnormal nuclear cardiac imaging test 11/14/2013  . Aortic atherosclerosis (HCC) 07/07/2017  . Arthritis    "all over" (09/30/2017)  . Ascending aortic aneurysm (HCC)    cMRI (2/15): Normal LV EF 54%,  Mild BAE, Trileaflet Aortic Valve, Upper limits of normal ascending aorta 3.8 cm, Normal aortic arch 2.3 cm with bovine origin of left carotid  . Atrial flutter (HCC)    Post-op with no reoccurence  . Chronic diastolic heart failure (HCC)    Echo (1/15): Left ventricle: The cavity size was mildly dilated. Mild LVH. EF 55% to 60%. Wall motion was normal; Gr 1 diastolic dysfunction Left atrium: The atrium was mildly dilated. Right atrium: The atrium was mildly to moderately dilated. Aortic arch measures 4.8 cm (moderately dilated); suggest CTA or MRA to better assess.  . Chronic systolic CHF (congestive heart failure) (HCC) 07/24/2017  . CKD (chronic kidney disease), stage III (HCC) 07/24/2017  . Coronary artery disease 10/2011   a. s/p cabg;  b. Myoview (9/15):  ant ischemia, poss TID, EF 43%, high risk >> LHC (9/15): Dist LM 50, pLAD 90 then 100, pCFX 50, mCFX 75, oRCA 75, dRCA 95, L-LAD patent, S-Dx 100, S-OM1 patent, S-PDA patent, EF 55% >> native Dx small caliber and not well suited for PCI; native RCA amenable to PCI and would restore flow to PLA branches but would jeopardize SVG-RCA - Med Rx  . Deviated nasal septum   . Dilated aortic root (HCC)   . Diverticulitis of sigmoid colon 07/24/2017  . DM (diabetes mellitus), type 2, uncontrolled with complications (HCC)    onset 2013  . Dyslipidemia   . Edema 02/09/2013  . GERD (gastroesophageal reflux disease)   . Hx of Doppler ultrasound    Carotid US (9/13): No ICA stenosis  . Hyperlipidemia 10/24/2011  .  Hypertension   . Pleural thickening 07/24/2017    Past Surgical History:  Procedure Laterality Date  . CORONARY ARTERY BYPASS GRAFT  10/27/2011   Procedure: CORONARY ARTERY BYPASS GRAFTING (CABG);  Surgeon: Kerin Perna, MD;  Location: [facility] OR;  Service: Open Heart Surgery;  Laterality: N/A;  Coronary Artery Bypass Grafting times four using left internal mammary artery and right greater saphenous vein endoscopically harvested  . CORONARY  STENT INTERVENTION N/A 09/30/2017   Procedure: CORONARY STENT INTERVENTION;  Surgeon: Corky Crafts, MD;  Location: [facility] INVASIVE CV LAB;  Service: Cardiovascular;  Laterality: N/A;  . KNEE ARTHROSCOPY Left 2003  . KNEE ARTHROSCOPY Right 2006  . LEFT HEART CATHETERIZATION WITH CORONARY ANGIOGRAM N/A 10/23/2011   Procedure: LEFT HEART CATHETERIZATION WITH CORONARY ANGIOGRAM;  Surgeon: Quintella Reichert, MD;  Location: [facility] CATH LAB;  Service: Cardiovascular;  Laterality: N/A;  . LEFT HEART CATHETERIZATION WITH CORONARY ANGIOGRAM N/A 11/16/2013   Procedure: LEFT HEART CATHETERIZATION WITH CORONARY ANGIOGRAM;  Surgeon: Micheline Chapman, MD;  Location: [facility] CATH LAB;  Service: Cardiovascular;  Laterality: N/A;  . RIGHT/LEFT HEART CATH AND CORONARY/GRAFT ANGIOGRAPHY N/A 09/30/2017   Procedure: RIGHT/LEFT HEART CATH AND CORONARY/GRAFT ANGIOGRAPHY;  Surgeon: Corky Crafts, MD;  Location[facility] IN[facility] CV LAB;  Service: Cardiovascular;  Laterality: N/A;    Current Medications: Prior to Admission medications   Medication Sig Start Date End Date Taking? Authorizing Provider  acetaminophen (TYLENOL) 500 MG tablet Take 1,000 mg by mouth every 6 (six) hours as needed for headache.    [provider]  aspirin 81 MG tablet Take 81 mg by mouth at bedtime.     [provider]  atorvastatin (LIPITOR) 80 MG tablet Take 1 tablet (80 mg total) by mouth daily. 10/01/17 10/01/18  Kroeger, Ovidio Kin., PA-C  cholecalciferol (VITAMIN D) 1000 units tablet Take 1,000 Units by mouth daily.    [provider]  ezetimibe (ZETIA) 10 MG tablet Take 1 tablet (10 mg total) by mouth daily. 09/24/17   Ameilia Rattan, PA  glimepiride (AMARYL) 4 MG tablet Take 4 mg by mouth daily with breakfast.    [provider]  insulin lispro (HUMALOG) 100 UNIT/ML injection Inject 25 Units into the skin 2 (two) times daily.    [provider]  iron polysaccharides (NIFEREX) 150 MG capsule Take 1 capsule  (150 mg total) by mouth daily. 07/30/17   Lenox Ponds, MD  isosorbide mononitrate (IMDUR) 60 MG 24 hr tablet Take 60 mg by mouth daily.    [provider]  metFORMIN (GLUCOPHAGE) 1000 MG tablet Take 1 tablet (1,000 mg total) by mouth 2 (two) times daily with a meal. 07/30/17 02/16/18  Randel Pigg, Dorma Russell, MD  metoprolol succinate (TOPROL-XL) 100 MG 24 hr tablet Take 125 mg by mouth daily.  08/26/17   [provider]  Multiple Vitamin (MULTIVITAMIN WITH MINERALS) TABS tablet Take 1 tablet by mouth daily.    [provider]  nitroGLYCERIN (NITROSTAT) 0.4 MG SL tablet Place 1 tablet (0.4 mg total) under the tongue every 5 (five) minutes as needed for chest pain. 10/01/17 10/01/18  Kroeger, Ovidio Kin., PA-C  Omega 3 1200 MG CAPS Take 2,400 mg by mouth daily.    [provider]  polyethylene glycol (MIRALAX) packet Take 17 g by mouth daily. Patient taking differently: Take 17 g by mouth daily as needed.  07/30/17   Lenox Ponds, MD  potassium chloride (K-DUR) 10 MEQ tablet Take 20 meq daily for 3  days then decrease to 10 meq daily 12/24/17   Korby Ratay, Sharrell Ku, PA  pramipexole (MIRAPEX) 0.75 MG tablet Take 1.5 mg by mouth every evening.     [provider]  ticagrelor (BRILINTA) 90 MG TABS tablet Take 1 tablet (90 mg total) by mouth 2 (two) times daily. 10/01/17   Kroeger, Ovidio Kin., PA-C  torsemide (DEMADEX) 20 MG tablet Take 40 mg for 3 days then decrease to 20 mg daily 12/24/17   Manson Passey, PA    Allergies:   Lisinopril   Social History   Socioeconomic History  . Marital status: Divorced    Spouse name: Not on file  . Number of children: Not on file  . Years of education: Not on file  . Highest education level: Not on file  Occupational History  . Not on file  Social Needs  . Financial resource strain: Not on file  . Food insecurity:    Worry: Not on file    Inability: Not on file  . Transportation needs:    Medical: Not on  file    Non-medical: Not on file  Tobacco Use  . Smoking status: Never Smoker  . Smokeless tobacco: Never Used  Substance and Sexual Activity  . Alcohol use: Yes    Comment: 09/30/2017 "1 beer a month or less"  . Drug use: Never  . Sexual activity: Not Currently  Lifestyle  . Physical activity:    Days per week: Not on file    Minutes per session: Not on file  . Stress: Not on file  Relationships  . Social connections:    Talks on phone: Not on file    Gets together: Not on file    Attends religious service: Not on file    Active member of club or organization: Not on file    Attends meetings of clubs or organizations: Not on file    Relationship status: Not on file  Other Topics Concern  . Not on file  Social History Narrative  . Not on file     Family History:  The patient's family history includes Hypertension in his father, mother, and paternal grandfather; Stroke in his father.   ROS:   Please see the history of present illness.    ROS All other systems reviewed and are negative.   PHYSICAL EXAM:   VS:  BP 140/82   Pulse 67   Ht 5\' 9"  (1.753 m)   Wt 250 lb 1.9 oz (113.5 kg)   SpO2 97%   BMI 36.94 kg/m    GEN: Well nourished, well developed, in no acute distress  HEENT: normal  Neck: no JVD, carotid bruits, or masses Cardiac: RRR; no murmurs, rubs, or gallops, trace BL LE edema  Respiratory:  clear to auscultation bilaterally, normal work of breathing GI: soft, nontender, nondistended, + BS MS: no deformity or atrophy  Skin: warm and dry, no rash Neuro:  Alert and Oriented x 3, Strength and sensation are intact Psych: euthymic mood, full affect  Wt Readings from Last 3 Encounters:  01/05/18 250 lb 1.9 oz (113.5 kg)  12/24/17 260 lb 1.9 oz (118 kg)  10/13/17 250 lb 12.8 oz (113.8 kg)      Studies/Labs Reviewed:   EKG:  EKG is not ordered today.    Recent Labs: 06/18/2017: NT-Pro BNP 274; TSH 3.340 07/30/2017: Magnesium 2.0 10/01/2017: Hemoglobin  11.1; Platelets 293 10/13/2017: ALT 19 12/24/2017: BUN 18; Creatinine, Ser 1.16; Potassium 4.4; Sodium 134   Lipid Panel  Component Value Date/Time   CHOL 116 10/13/2017 0902   TRIG 73 10/13/2017 0902   HDL 48 10/13/2017 0902   CHOLHDL 2.4 10/13/2017 0902   CHOLHDL 5.5 (H) 11/07/2015 0824   VLDL 23 11/07/2015 0824   LDLCALC 53 10/13/2017 0902    Additional studies/ records that were reviewed today include:   Right and left heart catheterization 09/30/17:  Ost RCA to Prox RCA lesion is 90% stenosed.  A drug-eluting stent was successfully placed using a STENT SYNERGY DES 3.5X16.  Post intervention, there is a 0% residual stenosis.  Dist RCA lesion is 80% stenosed.  Post intervention, there is a 0% residual stenosis.  A drug-eluting stent was successfully placed using a STENT SYNERGY DES [id number].  Ost RPDA to RPDA lesion is 100% stenosed. SVG to PDA is patent.  Ost LAD to Prox LAD lesion is 99% stenosed. LIMA to LAD is patent and fills a diagonal retrograde.  Ost 2nd Mrg to 2nd Mrg lesion is 95% stenosed. Prox Cx to Mid Cx lesion is 80% stenosed. SVG to OM is patent.  Origin to Prox Graft lesion is 100% stenosed.  LV end diastolic pressure is normal.  The left ventricular ejection fraction is 35-45% by visual estimate.  There is mild to moderate left ventricular systolic dysfunction.  There is no aortic valve stenosis.  Hemodynamic findings consistent with mild pulmonary hypertension.  CO 8.1 L/min; CI 3.6, PA pressure 40/20; PCWP mean 8 mm Hg. Ao sat 98%; PA 74%  Recommend uninterrupted dual antiplatelet therapy with Aspirin 81mg  daily and Ticagrelor 90mg  twice dailyfor a minimum of 6 months (stable ischemic heart disease - Class I recommendation).  OK to switch to Plavix if there are cost issues or bleeding problems.   Would consider longterm clopidogrel therapy given the extent of his disease. _____________   2D echo  06/18/2017------------------------------------------------------------------- Study Conclusions  - Procedure narrative: Transthoracic echocardiography. Image quality was adequate. The study was technically difficult, as a result of poor acoustic windows and body habitus. Intravenous contrast (Definity) was administered. - Left ventricle: The cavity size was normal. Wall thickness was increased in a pattern of mild LVH. Systolic function was mildly to moderately reduced. The estimated ejection fraction was in the range of 40% to 45%. Incoordinate septal motion and possible anteroseptal hypokinesis. The study is not technically sufficient to allow evaluation of LV diastolic function. - Left atrium: The atrium was normal in size. - Right atrium: Moderately dilated. - Inferior vena cava: The vessel was dilated. The respirophasic diameter changes were blunted (<50%), consistent with elevated central venous pressure.  Impressions:  - Technically difficult study. Definity contrast given. LVEF 40-45%, mild LVH, incoordinate septal motion and possible anteroseptal hypokinesis, normal LA size, moderate RAE, dilated IVC.    ASSESSMENT & PLAN:    1. CAD s/p CABG and DES to the proximal and distal RCA 09/30/2017  - Continue ASA and Brillinta for at least 6 months. Then plan for long term plavix. No angina. Continue BB and statin. no angina.   2. Acute on chronic combined CHF/ICM - Echo showed LVEF of 40-45%. EF was 35-40% by cath.  Lost 10lb. Very close to euvolemic. Continue BB. Unsure if he has started lisinopril or not. Will check lab today and adjust medication tomorrow.   3. HTN - BP relatively stable on BB.   4. HLD - 10/13/2017: Cholesterol, Total 116; HDL 48; LDL Calculated 53; Triglycerides 73 - Continue lipitor and zetia.       Medication Adjustments/Labs  and Tests Ordered: Current medicines are reviewed at length with the patient today.   Concerns regarding medicines are outlined above.  Medication changes, Labs and Tests ordered today are listed in the Patient Instructions below. Patient Instructions  Medication Instructions:  1. Your physician recommends that you continue on your current medications as directed. Please refer to the Current Medication list given to you today.  If you need a refill on your cardiac medications before your next appointment, please call your pharmacy.   Lab work: BMET TODAY If you have labs (blood work) drawn today and your tests are completely normal, you will receive your results only by: Marland Kitchen MyChart Message (if you have MyChart) OR . A paper copy in the mail If you have any lab test that is abnormal or we need to change your treatment, we will call you to review the results.  Testing/Procedures: NONE ORDERED TODAY  Follow-Up: At [facility], you and your health needs are our priority.  As part of our continuing mission to provide you with exceptional heart care, we have created designated Provider Care Teams.  These Care Teams include your primary Cardiologist (physician) and Advanced Practice Providers (APPs -  Physician Assistants and Nurse Practitioners) who all work together to provide you with the care you need, when you need it. You will need a follow up appointment in 3-4 months.  Please call our office 2 months in advance to schedule this appointment.  You may see Armanda Magic, MD or one of the following Advanced Practice Providers on your designated Care Team:   Eleele, PA-C Ronie Spies, PA-C . Jacolyn Reedy, PA-C  Any Other Special Instructions Will Be Listed Below (If Applicable).       Lorelei Pont, [place]  01/05/2018 11:31 AM    [facility] [address] [place], [place], [place]  [address] Phone: [phone]; Fax: [phone]

## 2018-01-06 ENCOUNTER — Ambulatory Visit (HOSPITAL_COMMUNITY): Payer: Medicare HMO

## 2018-01-08 ENCOUNTER — Ambulatory Visit (HOSPITAL_COMMUNITY): Payer: Medicare HMO

## 2018-01-08 ENCOUNTER — Telehealth (HOSPITAL_COMMUNITY): Payer: Self-pay | Admitting: *Deleted

## 2018-01-11 ENCOUNTER — Ambulatory Visit (HOSPITAL_COMMUNITY): Payer: Medicare HMO

## 2018-01-12 ENCOUNTER — Encounter: Payer: Self-pay | Admitting: *Deleted

## 2018-01-12 NOTE — Telephone Encounter (Signed)
Several attempts made to reach pt re: lab results, messages left. Will mail letter for pt to contact the office.

## 2018-01-13 ENCOUNTER — Ambulatory Visit (HOSPITAL_COMMUNITY): Payer: Medicare HMO

## 2018-01-13 MED FILL — METOPROLOL SUCCINATE ER 25: 25 | 30 days supply | Qty: 30 | Fill #7

## 2018-01-13 MED FILL — BRILINTA 90 MG TABLET: 90 | 30 days supply | Qty: 60 | Fill #2

## 2018-01-15 ENCOUNTER — Ambulatory Visit (HOSPITAL_COMMUNITY): Payer: Medicare HMO

## 2018-01-18 ENCOUNTER — Ambulatory Visit (HOSPITAL_COMMUNITY): Payer: Medicare HMO

## 2018-01-20 ENCOUNTER — Ambulatory Visit (HOSPITAL_COMMUNITY): Payer: Medicare HMO

## 2018-01-22 ENCOUNTER — Ambulatory Visit (HOSPITAL_COMMUNITY): Payer: Medicare HMO

## 2018-01-25 ENCOUNTER — Ambulatory Visit (HOSPITAL_COMMUNITY): Payer: Medicare HMO

## 2018-01-27 ENCOUNTER — Ambulatory Visit (HOSPITAL_COMMUNITY): Payer: Medicare HMO

## 2018-01-27 ENCOUNTER — Other Ambulatory Visit: Payer: Medicare HMO

## 2018-01-29 ENCOUNTER — Ambulatory Visit (HOSPITAL_COMMUNITY): Payer: Medicare HMO

## 2018-01-29 MED FILL — METOPROLOL SUCCINATE ER 100: 100 | 30 days supply | Qty: 30 | Fill #0

## 2018-01-29 MED FILL — EZETIMIBE 10 MG TABS: 10 | 90 days supply | Qty: 90 | Fill #1

## 2018-02-01 ENCOUNTER — Ambulatory Visit (HOSPITAL_COMMUNITY): Payer: Medicare HMO

## 2018-02-02 MED FILL — TORSEMIDE 20 MG TABLET: 20 | 33 days supply | Qty: 33 | Fill #1

## 2018-02-02 MED FILL — POTASSIUM CL ER 10 MEQ TAB: 10 | 30 days supply | Qty: 33 | Fill #1

## 2018-02-03 ENCOUNTER — Ambulatory Visit (HOSPITAL_COMMUNITY): Payer: Medicare HMO

## 2018-02-03 MED FILL — metFORMIN HCL 1000 MG TABS: 1000 | 30 days supply | Qty: 60 | Fill #0

## 2018-02-05 ENCOUNTER — Ambulatory Visit (HOSPITAL_COMMUNITY): Payer: Medicare HMO

## 2018-02-05 MED FILL — ULTICARE PEN NDL 8MM 31G: 31G X 8 MM | 50 days supply | Qty: 100 | Fill #3

## 2018-02-05 MED FILL — PRAMIPEXOLE 0.75 MG TABLET: 0.75 | 30 days supply | Qty: 60 | Fill #1

## 2018-02-08 ENCOUNTER — Ambulatory Visit (HOSPITAL_COMMUNITY): Payer: Medicare HMO

## 2018-02-12 ENCOUNTER — Ambulatory Visit (HOSPITAL_COMMUNITY): Payer: Medicare HMO

## 2018-02-15 ENCOUNTER — Ambulatory Visit (HOSPITAL_COMMUNITY): Payer: Medicare HMO

## 2018-02-18 MED FILL — BRILINTA 90 MG TABLET: 90 | 30 days supply | Qty: 60 | Fill #3

## 2018-02-19 ENCOUNTER — Ambulatory Visit (HOSPITAL_COMMUNITY): Payer: Medicare HMO

## 2018-02-22 ENCOUNTER — Ambulatory Visit (HOSPITAL_COMMUNITY): Payer: Medicare HMO

## 2018-02-22 DIAGNOSIS — N183 Chronic kidney disease, stage 3 (moderate): Secondary | ICD-10-CM | POA: Diagnosis not present

## 2018-02-22 DIAGNOSIS — E11319 Type 2 diabetes mellitus with unspecified diabetic retinopathy without macular edema: Secondary | ICD-10-CM | POA: Diagnosis not present

## 2018-02-22 DIAGNOSIS — E118 Type 2 diabetes mellitus with unspecified complications: Secondary | ICD-10-CM | POA: Diagnosis not present

## 2018-02-22 DIAGNOSIS — Z6838 Body mass index (BMI) 38.0-38.9, adult: Secondary | ICD-10-CM | POA: Diagnosis not present

## 2018-02-22 DIAGNOSIS — E1165 Type 2 diabetes mellitus with hyperglycemia: Secondary | ICD-10-CM | POA: Diagnosis not present

## 2018-02-22 DIAGNOSIS — I25810 Atherosclerosis of coronary artery bypass graft(s) without angina pectoris: Secondary | ICD-10-CM | POA: Diagnosis not present

## 2018-02-23 MED FILL — metFORMIN HCL ER 500 MG TB2: 500 | 30 days supply | Qty: 30 | Fill #0

## 2018-02-23 MED FILL — OZEMPIC 0.25 OR 0.5 MG/DOSE: 2 | 28 days supply | Qty: 2 | Fill #0

## 2018-02-23 MED FILL — NOVOLOG FLEXPEN SYRINGE: 100 | 30 days supply | Qty: 15 | Fill #0

## 2018-02-23 MED FILL — TOUJEO MAX SOLOSTAR 300 UNI: 300 | 20 days supply | Qty: 6 | Fill #0

## 2018-02-24 ENCOUNTER — Ambulatory Visit (HOSPITAL_COMMUNITY): Payer: Medicare HMO

## 2018-02-26 ENCOUNTER — Ambulatory Visit (HOSPITAL_COMMUNITY): Payer: Medicare HMO

## 2018-03-01 ENCOUNTER — Ambulatory Visit (HOSPITAL_COMMUNITY): Payer: Medicare HMO

## 2018-03-03 ENCOUNTER — Ambulatory Visit (HOSPITAL_COMMUNITY): Payer: Medicare HMO

## 2018-03-05 ENCOUNTER — Ambulatory Visit (HOSPITAL_COMMUNITY): Payer: Medicare HMO

## 2018-03-08 ENCOUNTER — Ambulatory Visit (HOSPITAL_COMMUNITY): Payer: Medicare HMO

## 2018-03-10 ENCOUNTER — Ambulatory Visit (HOSPITAL_COMMUNITY): Payer: Medicare HMO

## 2018-03-10 MED FILL — PRAMIPEXOLE 0.75 MG TABLET: 0.75 | 30 days supply | Qty: 60 | Fill #2

## 2018-03-12 ENCOUNTER — Ambulatory Visit (HOSPITAL_COMMUNITY): Payer: Medicare HMO

## 2018-03-15 ENCOUNTER — Ambulatory Visit (HOSPITAL_COMMUNITY): Payer: Medicare HMO

## 2018-03-16 ENCOUNTER — Ambulatory Visit: Payer: Medicare HMO | Admitting: Registered"

## 2018-03-17 ENCOUNTER — Ambulatory Visit (HOSPITAL_COMMUNITY): Payer: Medicare HMO

## 2018-03-19 ENCOUNTER — Ambulatory Visit (HOSPITAL_COMMUNITY): Payer: Medicare HMO

## 2018-03-22 ENCOUNTER — Ambulatory Visit (HOSPITAL_COMMUNITY): Payer: Medicare HMO

## 2018-03-24 ENCOUNTER — Ambulatory Visit (HOSPITAL_COMMUNITY): Payer: Medicare HMO

## 2018-03-24 DIAGNOSIS — E118 Type 2 diabetes mellitus with unspecified complications: Secondary | ICD-10-CM | POA: Diagnosis not present

## 2018-03-24 DIAGNOSIS — I25810 Atherosclerosis of coronary artery bypass graft(s) without angina pectoris: Secondary | ICD-10-CM | POA: Diagnosis not present

## 2018-03-24 DIAGNOSIS — N183 Chronic kidney disease, stage 3 (moderate): Secondary | ICD-10-CM | POA: Diagnosis not present

## 2018-03-24 DIAGNOSIS — E1165 Type 2 diabetes mellitus with hyperglycemia: Secondary | ICD-10-CM | POA: Diagnosis not present

## 2018-03-24 DIAGNOSIS — Z6841 Body Mass Index (BMI) 40.0 and over, adult: Secondary | ICD-10-CM | POA: Diagnosis not present

## 2018-03-24 DIAGNOSIS — E11319 Type 2 diabetes mellitus with unspecified diabetic retinopathy without macular edema: Secondary | ICD-10-CM | POA: Diagnosis not present

## 2018-03-24 MED FILL — OZEMPIC 0.25 OR 0.5 MG/DOSE: 2 | 28 days supply | Qty: 2 | Fill #1

## 2018-03-24 MED FILL — TORSEMIDE 20 MG TABLET: 20 | 33 days supply | Qty: 33 | Fill #2

## 2018-03-24 MED FILL — FREESTYLE LIBRE 14 DAY READ: 30 days supply | Qty: 1 | Fill #0

## 2018-03-24 MED FILL — metFORMIN HCL ER 500 MG TB2: 500 | 30 days supply | Qty: 60 | Fill #0

## 2018-03-24 MED FILL — METOPROLOL SUCCINATE ER 100: 100 | 30 days supply | Qty: 30 | Fill #1

## 2018-03-24 MED FILL — FREESTYLE LIBRE 14 DAY SENS: 28 days supply | Qty: 2 | Fill #0

## 2018-03-26 ENCOUNTER — Ambulatory Visit (HOSPITAL_COMMUNITY): Payer: Medicare HMO

## 2018-04-06 MED FILL — BRILINTA 90 MG TABLET: 90 | 30 days supply | Qty: 60 | Fill #4

## 2018-04-09 MED FILL — PRAMIPEXOLE 0.75 MG TABLET: 0.75 | 30 days supply | Qty: 60 | Fill #3

## 2018-04-12 DIAGNOSIS — Z961 Presence of intraocular lens: Secondary | ICD-10-CM | POA: Diagnosis not present

## 2018-04-28 MED FILL — ATORVASTATIN 80 MG TABLET: 80 | 90 days supply | Qty: 90 | Fill #1

## 2018-04-28 MED FILL — BD PEN NDL SHORT 31GX5/16: 31G X 8 MM | 50 days supply | Qty: 100 | Fill #0

## 2018-04-28 MED FILL — BD PEN NDL SHORT 31GX5/16": 31G X 8 MM | 50 days supply | Qty: 100 | Fill #0

## 2018-05-03 MED FILL — TORSEMIDE 20 MG TABLET: 20 | 33 days supply | Qty: 33 | Fill #3

## 2018-05-07 MED FILL — METOPROLOL SUCCINATE ER 100: 100 | 30 days supply | Qty: 30 | Fill #2

## 2018-05-07 MED FILL — PRAMIPEXOLE 0.75 MG TABLET: 0.75 | 30 days supply | Qty: 60 | Fill #4

## 2018-05-14 MED FILL — BRILINTA 90 MG TABLET: 90 | 30 days supply | Qty: 60 | Fill #5

## 2018-05-15 MED FILL — TOUJEO MAX SOLOSTAR 300 UNI: 300 | 20 days supply | Qty: 6 | Fill #1

## 2018-05-18 MED FILL — metFORMIN HCL ER 500 MG TB2: 500 | 30 days supply | Qty: 60 | Fill #0

## 2018-06-02 MED FILL — BD PEN NDL SHORT 31GX5/16": 31G X 8 MM | 50 days supply | Qty: 100 | Fill #1

## 2018-06-02 MED FILL — BD PEN NDL SHORT 31GX5/16: 31G X 8 MM | 50 days supply | Qty: 100 | Fill #1

## 2018-06-02 MED FILL — TORSEMIDE 20 MG TABLET: 20 | 33 days supply | Qty: 33 | Fill #4

## 2018-06-09 MED FILL — EZETIMIBE 10 MG TABS: 10 | 90 days supply | Qty: 90 | Fill #2

## 2018-06-09 MED FILL — PRAMIPEXOLE 0.75 MG TABLET: 0.75 | 30 days supply | Qty: 60 | Fill #0

## 2018-06-14 DIAGNOSIS — G2581 Restless legs syndrome: Secondary | ICD-10-CM | POA: Diagnosis not present

## 2018-06-14 DIAGNOSIS — I25119 Atherosclerotic heart disease of native coronary artery with unspecified angina pectoris: Secondary | ICD-10-CM | POA: Diagnosis not present

## 2018-06-14 DIAGNOSIS — I130 Hypertensive heart and chronic kidney disease with heart failure and stage 1 through stage 4 chronic kidney disease, or unspecified chronic kidney disease: Secondary | ICD-10-CM | POA: Diagnosis not present

## 2018-06-14 DIAGNOSIS — E782 Mixed hyperlipidemia: Secondary | ICD-10-CM | POA: Diagnosis not present

## 2018-06-14 DIAGNOSIS — E1121 Type 2 diabetes mellitus with diabetic nephropathy: Secondary | ICD-10-CM | POA: Diagnosis not present

## 2018-06-14 MED FILL — METOPROLOL SUCCINATE ER 100: 100 | 90 days supply | Qty: 90 | Fill #0

## 2018-07-01 MED FILL — metFORMIN HCL ER 500 MG TB2: 500 | 30 days supply | Qty: 60 | Fill #0

## 2018-07-01 MED FILL — TORSEMIDE 20 MG TABLET: 20 | 30 days supply | Qty: 30 | Fill #5

## 2018-07-05 DIAGNOSIS — R809 Proteinuria, unspecified: Secondary | ICD-10-CM | POA: Diagnosis not present

## 2018-07-05 DIAGNOSIS — E11319 Type 2 diabetes mellitus with unspecified diabetic retinopathy without macular edema: Secondary | ICD-10-CM | POA: Diagnosis not present

## 2018-07-05 DIAGNOSIS — E118 Type 2 diabetes mellitus with unspecified complications: Secondary | ICD-10-CM | POA: Diagnosis not present

## 2018-07-05 DIAGNOSIS — Z6841 Body Mass Index (BMI) 40.0 and over, adult: Secondary | ICD-10-CM | POA: Diagnosis not present

## 2018-07-05 DIAGNOSIS — N183 Chronic kidney disease, stage 3 (moderate): Secondary | ICD-10-CM | POA: Diagnosis not present

## 2018-07-05 DIAGNOSIS — Z7189 Other specified counseling: Secondary | ICD-10-CM | POA: Diagnosis not present

## 2018-07-05 DIAGNOSIS — E1165 Type 2 diabetes mellitus with hyperglycemia: Secondary | ICD-10-CM | POA: Diagnosis not present

## 2018-07-05 DIAGNOSIS — I25810 Atherosclerosis of coronary artery bypass graft(s) without angina pectoris: Secondary | ICD-10-CM | POA: Diagnosis not present

## 2018-07-08 MED FILL — PRAMIPEXOLE 0.75 MG TABLET: 0.75 | 30 days supply | Qty: 60 | Fill #0

## 2018-07-13 DIAGNOSIS — E1165 Type 2 diabetes mellitus with hyperglycemia: Secondary | ICD-10-CM | POA: Diagnosis not present

## 2018-07-22 MED FILL — TOUJEO MAX SOLOSTAR 300 UNI: 300 | 20 days supply | Qty: 6 | Fill #2

## 2018-08-03 MED FILL — TORSEMIDE 20 MG TABLET: 20 | 30 days supply | Qty: 30 | Fill #6

## 2018-08-06 MED FILL — BD PEN NDL SHORT 31GX5/16: 31G X 8 MM | 50 days supply | Qty: 100 | Fill #2

## 2018-08-06 MED FILL — BD PEN NDL SHORT 31GX5/16": 31G X 8 MM | 50 days supply | Qty: 100 | Fill #2

## 2018-08-17 MED FILL — PRAMIPEXOLE 0.75 MG TABLET: 0.75 | 30 days supply | Qty: 60 | Fill #1

## 2018-09-06 DIAGNOSIS — Z6841 Body Mass Index (BMI) 40.0 and over, adult: Secondary | ICD-10-CM | POA: Diagnosis not present

## 2018-09-06 DIAGNOSIS — E1165 Type 2 diabetes mellitus with hyperglycemia: Secondary | ICD-10-CM | POA: Diagnosis not present

## 2018-09-06 DIAGNOSIS — E118 Type 2 diabetes mellitus with unspecified complications: Secondary | ICD-10-CM | POA: Diagnosis not present

## 2018-09-06 DIAGNOSIS — E11319 Type 2 diabetes mellitus with unspecified diabetic retinopathy without macular edema: Secondary | ICD-10-CM | POA: Diagnosis not present

## 2018-09-06 DIAGNOSIS — R809 Proteinuria, unspecified: Secondary | ICD-10-CM | POA: Diagnosis not present

## 2018-09-06 DIAGNOSIS — N183 Chronic kidney disease, stage 3 (moderate): Secondary | ICD-10-CM | POA: Diagnosis not present

## 2018-09-06 DIAGNOSIS — I25810 Atherosclerosis of coronary artery bypass graft(s) without angina pectoris: Secondary | ICD-10-CM | POA: Diagnosis not present

## 2018-09-06 DIAGNOSIS — Z7189 Other specified counseling: Secondary | ICD-10-CM | POA: Diagnosis not present

## 2018-09-10 MED FILL — TORSEMIDE 20 MG TABLET: 20 | 30 days supply | Qty: 30 | Fill #7

## 2018-09-13 MED FILL — METFORMIN HCL ER 500 MG TB2: 500 | 30 days supply | Qty: 60 | Fill #0

## 2018-09-20 MED FILL — PRAMIPEXOLE 0.75 MG TABLET: 0.75 | 30 days supply | Qty: 60 | Fill #2

## 2018-09-20 MED FILL — METOPROLOL SUCCINATE ER 100: 100 | 90 days supply | Qty: 90 | Fill #1

## 2018-09-20 MED FILL — EZETIMIBE 10 MG TABS: 10 | 90 days supply | Qty: 90 | Fill #3

## 2018-09-23 DIAGNOSIS — H0520 Unspecified exophthalmos: Secondary | ICD-10-CM | POA: Diagnosis not present

## 2018-09-23 DIAGNOSIS — H26491 Other secondary cataract, right eye: Secondary | ICD-10-CM | POA: Diagnosis not present

## 2018-09-23 DIAGNOSIS — E113293 Type 2 diabetes mellitus with mild nonproliferative diabetic retinopathy without macular edema, bilateral: Secondary | ICD-10-CM | POA: Diagnosis not present

## 2018-09-23 DIAGNOSIS — H35033 Hypertensive retinopathy, bilateral: Secondary | ICD-10-CM | POA: Diagnosis not present

## 2018-09-23 DIAGNOSIS — Z961 Presence of intraocular lens: Secondary | ICD-10-CM | POA: Diagnosis not present

## 2018-10-06 MED FILL — TOUJEO MAX SOLOSTAR 300 UNI: 300 | 20 days supply | Qty: 6 | Fill #3

## 2018-10-12 DIAGNOSIS — I25810 Atherosclerosis of coronary artery bypass graft(s) without angina pectoris: Secondary | ICD-10-CM | POA: Diagnosis not present

## 2018-10-12 DIAGNOSIS — E11319 Type 2 diabetes mellitus with unspecified diabetic retinopathy without macular edema: Secondary | ICD-10-CM | POA: Diagnosis not present

## 2018-10-12 DIAGNOSIS — E118 Type 2 diabetes mellitus with unspecified complications: Secondary | ICD-10-CM | POA: Diagnosis not present

## 2018-10-12 DIAGNOSIS — E1165 Type 2 diabetes mellitus with hyperglycemia: Secondary | ICD-10-CM | POA: Diagnosis not present

## 2018-10-12 MED FILL — GLIMEPIRIDE 4 MG TABLET: 4 | 30 days supply | Qty: 30 | Fill #0

## 2018-10-12 MED FILL — FREESTYLE LIBRE 14 DAY SENS: 28 days supply | Qty: 2 | Fill #0

## 2018-10-13 DIAGNOSIS — I130 Hypertensive heart and chronic kidney disease with heart failure and stage 1 through stage 4 chronic kidney disease, or unspecified chronic kidney disease: Secondary | ICD-10-CM | POA: Diagnosis not present

## 2018-10-13 DIAGNOSIS — Z125 Encounter for screening for malignant neoplasm of prostate: Secondary | ICD-10-CM | POA: Diagnosis not present

## 2018-10-13 DIAGNOSIS — E782 Mixed hyperlipidemia: Secondary | ICD-10-CM | POA: Diagnosis not present

## 2018-10-13 DIAGNOSIS — I25118 Atherosclerotic heart disease of native coronary artery with other forms of angina pectoris: Secondary | ICD-10-CM | POA: Diagnosis not present

## 2018-10-13 DIAGNOSIS — N183 Chronic kidney disease, stage 3 (moderate): Secondary | ICD-10-CM | POA: Diagnosis not present

## 2018-10-13 DIAGNOSIS — E1121 Type 2 diabetes mellitus with diabetic nephropathy: Secondary | ICD-10-CM | POA: Diagnosis not present

## 2018-10-13 DIAGNOSIS — Z23 Encounter for immunization: Secondary | ICD-10-CM | POA: Diagnosis not present

## 2018-10-13 DIAGNOSIS — Z0000 Encounter for general adult medical examination without abnormal findings: Secondary | ICD-10-CM | POA: Diagnosis not present

## 2018-10-13 DIAGNOSIS — I10 Essential (primary) hypertension: Secondary | ICD-10-CM | POA: Diagnosis not present

## 2018-10-13 DIAGNOSIS — Z794 Long term (current) use of insulin: Secondary | ICD-10-CM | POA: Diagnosis not present

## 2018-10-14 MED FILL — TORSEMIDE 20 MG TABLET: 20 | 30 days supply | Qty: 30 | Fill #8

## 2018-10-20 MED FILL — OZEMPIC 1 MG/DOSE SOPN: 2 | 28 days supply | Qty: 3 | Fill #0

## 2018-10-21 MED FILL — PRAMIPEXOLE 0.75 MG TABLET: 0.75 | 90 days supply | Qty: 180 | Fill #0

## 2018-10-22 MED FILL — BD PEN NDL SHORT 31GX5/16: 31G X 8 MM | 50 days supply | Qty: 100 | Fill #3

## 2018-10-22 MED FILL — BD PEN NDL SHORT 31GX5/16": 31G X 8 MM | 50 days supply | Qty: 100 | Fill #3

## 2018-11-05 MED FILL — FREESTYLE LIBRE 14 DAY SENS: 28 days supply | Qty: 2 | Fill #1

## 2018-11-05 MED FILL — GLIMEPIRIDE 4 MG TABLET: 4 | 30 days supply | Qty: 30 | Fill #1

## 2018-11-10 DIAGNOSIS — E118 Type 2 diabetes mellitus with unspecified complications: Secondary | ICD-10-CM | POA: Diagnosis not present

## 2018-11-10 DIAGNOSIS — I25810 Atherosclerosis of coronary artery bypass graft(s) without angina pectoris: Secondary | ICD-10-CM | POA: Diagnosis not present

## 2018-11-10 DIAGNOSIS — E11319 Type 2 diabetes mellitus with unspecified diabetic retinopathy without macular edema: Secondary | ICD-10-CM | POA: Diagnosis not present

## 2018-11-10 DIAGNOSIS — Z794 Long term (current) use of insulin: Secondary | ICD-10-CM | POA: Diagnosis not present

## 2018-11-10 DIAGNOSIS — E1165 Type 2 diabetes mellitus with hyperglycemia: Secondary | ICD-10-CM | POA: Diagnosis not present

## 2018-11-10 MED FILL — metFORMIN HCL ER 500 MG TB2: 500 | 30 days supply | Qty: 30 | Fill #0

## 2018-11-26 MED FILL — TORSEMIDE 20 MG TABLET: 20 | 30 days supply | Qty: 30 | Fill #9

## 2018-11-26 MED FILL — metFORMIN HCL ER 500 MG TB2: 500 | 30 days supply | Qty: 30 | Fill #0

## 2018-11-29 MED FILL — FREESTYLE LIBRE 14 DAY SENS: 14 days supply | Qty: 1 | Fill #2

## 2018-12-20 ENCOUNTER — Encounter (HOSPITAL_COMMUNITY): Payer: Self-pay | Admitting: Emergency Medicine

## 2018-12-20 ENCOUNTER — Emergency Department (HOSPITAL_COMMUNITY)
Admission: EM | Admit: 2018-12-20 | Discharge: 2018-12-20 | Disposition: A | Discharge status: Home / Self Care | Payer: Medicare HMO | Attending: Emergency Medicine | Admitting: Emergency Medicine

## 2018-12-20 ENCOUNTER — Emergency Department (HOSPITAL_COMMUNITY): Payer: Medicare HMO

## 2018-12-20 ENCOUNTER — Other Ambulatory Visit: Payer: Self-pay

## 2018-12-20 DIAGNOSIS — Z951 Presence of aortocoronary bypass graft: Secondary | ICD-10-CM | POA: Diagnosis not present

## 2018-12-20 DIAGNOSIS — I259 Chronic ischemic heart disease, unspecified: Secondary | ICD-10-CM | POA: Diagnosis not present

## 2018-12-20 DIAGNOSIS — Z209 Contact with and (suspected) exposure to unspecified communicable disease: Secondary | ICD-10-CM | POA: Diagnosis not present

## 2018-12-20 DIAGNOSIS — Z794 Long term (current) use of insulin: Secondary | ICD-10-CM | POA: Diagnosis not present

## 2018-12-20 DIAGNOSIS — Z955 Presence of coronary angioplasty implant and graft: Secondary | ICD-10-CM | POA: Insufficient documentation

## 2018-12-20 DIAGNOSIS — R609 Edema, unspecified: Secondary | ICD-10-CM | POA: Diagnosis not present

## 2018-12-20 DIAGNOSIS — Z20828 Contact with and (suspected) exposure to other viral communicable diseases: Secondary | ICD-10-CM | POA: Diagnosis not present

## 2018-12-20 DIAGNOSIS — R0602 Shortness of breath: Secondary | ICD-10-CM | POA: Insufficient documentation

## 2018-12-20 DIAGNOSIS — N183 Chronic kidney disease, stage 3 unspecified: Secondary | ICD-10-CM | POA: Diagnosis not present

## 2018-12-20 DIAGNOSIS — I130 Hypertensive heart and chronic kidney disease with heart failure and stage 1 through stage 4 chronic kidney disease, or unspecified chronic kidney disease: Secondary | ICD-10-CM | POA: Insufficient documentation

## 2018-12-20 DIAGNOSIS — I10 Essential (primary) hypertension: Secondary | ICD-10-CM | POA: Diagnosis not present

## 2018-12-20 DIAGNOSIS — E1122 Type 2 diabetes mellitus with diabetic chronic kidney disease: Secondary | ICD-10-CM | POA: Insufficient documentation

## 2018-12-20 DIAGNOSIS — R05 Cough: Secondary | ICD-10-CM | POA: Diagnosis not present

## 2018-12-20 DIAGNOSIS — Z7982 Long term (current) use of aspirin: Secondary | ICD-10-CM | POA: Diagnosis not present

## 2018-12-20 DIAGNOSIS — I5042 Chronic combined systolic (congestive) and diastolic (congestive) heart failure: Secondary | ICD-10-CM | POA: Insufficient documentation

## 2018-12-20 DIAGNOSIS — Z79899 Other long term (current) drug therapy: Secondary | ICD-10-CM | POA: Insufficient documentation

## 2018-12-20 DIAGNOSIS — R140 Abdominal distension (gaseous): Secondary | ICD-10-CM | POA: Diagnosis not present

## 2018-12-20 LAB — TROPONIN I (HIGH SENSITIVITY)
Troponin I (High Sensitivity): 17 ng/L (ref <18)
Troponin I (High Sensitivity): 21 ng/L — ABNORMAL HIGH (ref <18)

## 2018-12-20 LAB — CBC
HCT: 44.8 % (ref 39.0–52.0)
Hemoglobin: 15.2 g/dL (ref 13.0–17.0)
MCH: 29.7 pg (ref 26.0–34.0)
MCHC: 33.9 g/dL (ref 30.0–36.0)
MCV: 87.5 fL (ref 80.0–100.0)
Platelets: 230 10*3/uL (ref 150–400)
RBC: 5.12 MIL/uL (ref 4.22–5.81)
RDW: 12.4 % (ref 11.5–15.5)
WBC: 8.4 10*3/uL (ref 4.0–10.5)
nRBC: 0 % (ref 0.0–0.2)

## 2018-12-20 LAB — BASIC METABOLIC PANEL
Anion gap: 11 (ref 5–15)
BUN: 19 mg/dL (ref 8–23)
CO2: 23 mmol/L (ref 22–32)
Calcium: 8.5 mg/dL — ABNORMAL LOW (ref 8.9–10.3)
Chloride: 99 mmol/L (ref 98–111)
Creatinine, Ser: 1.34 mg/dL — ABNORMAL HIGH (ref 0.61–1.24)
GFR calc Af Amer: >60 mL/min (ref >60)
GFR calc non Af Amer: 52 mL/min — ABNORMAL LOW (ref >60)
Glucose, Bld: 327 mg/dL — ABNORMAL HIGH (ref 70–99)
Potassium: 4.7 mmol/L (ref 3.5–5.1)
Sodium: 133 mmol/L — ABNORMAL LOW (ref 135–145)

## 2018-12-20 LAB — CBG MONITORING, ED: Glucose-Capillary: 203 mg/dL — ABNORMAL HIGH (ref 70–99)

## 2018-12-20 LAB — BRAIN NATRIURETIC PEPTIDE: B Natriuretic Peptide: 246.5 pg/mL — ABNORMAL HIGH (ref 0.0–100.0)

## 2018-12-20 MED ORDER — SODIUM CHLORIDE 0.9% FLUSH
3.0000 mL | Freq: Once | INTRAVENOUS | Status: AC
  Administered 2018-12-20: 3 mL via INTRAVENOUS

## 2018-12-20 MED ORDER — IOHEXOL 350 MG/ML SOLN
75.0000 mL | Freq: Once | INTRAVENOUS | Status: AC | PRN
  Administered 2018-12-20: 20:00:00 75 mL via INTRAVENOUS

## 2018-12-20 NOTE — ED Provider Notes (Addendum)
[facility] EMERGENCY DEPARTMENT Provider Note   CSN: [id number] Arrival date & time: 12/20/18  1607     History   Chief Complaint Chief Complaint  Patient presents with   Shortness of Breath    HPI James [name] is a 73 y.o. male.     The history is provided by the patient.  Shortness of Breath Severity:  Mild Onset quality:  Gradual Duration:  2 days Timing:  Constant Progression:  Unchanged Chronicity:  New Context: activity   Relieved by:  Nothing Worsened by:  Nothing Associated symptoms: cough and fever   Associated symptoms: no abdominal pain, no chest pain, no ear pain, no rash, no sore throat and no vomiting   Risk factors: no hx of PE/DVT   Risk factors comment:  Hx of CHF, CAD    Past Medical History:  Diagnosis Date   Abnormal EKG 03/09/2013   Abnormal nuclear cardiac imaging test 11/14/2013   Aortic atherosclerosis (HCC) 07/07/2017   Arthritis    "all over" (09/30/2017)   Ascending aortic aneurysm (HCC)    cMRI (2/15): Normal LV EF 54%, Mild BAE, Trileaflet Aortic Valve, Upper limits of normal ascending aorta 3.8 cm, Normal aortic arch 2.3 cm with bovine origin of left carotid   Atrial flutter (HCC)    Post-op with no reoccurence   Chronic diastolic heart failure (HCC)    Echo (1/15): Left ventricle: The cavity size was mildly dilated. Mild LVH. EF 55% to 60%. Wall motion was normal; Gr 1 diastolic dysfunction Left atrium: The atrium was mildly dilated. Right atrium: The atrium was mildly to moderately dilated. Aortic arch measures 4.8 cm (moderately dilated); suggest CTA or MRA to better assess.   Chronic systolic CHF (congestive heart failure) (HCC) 07/24/2017   CKD (chronic kidney disease), stage III 07/24/2017   Coronary artery disease 10/2011   a. s/p cabg;  b. Myoview (9/15):  ant ischemia, poss TID, EF 43%, high risk >> LHC (9/15): Dist LM 50, pLAD 90 then 100, pCFX 50, mCFX 75, oRCA 75, dRCA 95, L-LAD patent, S-Dx  100, S-OM1 patent, S-PDA patent, EF 55% >> native Dx small caliber and not well suited for PCI; native RCA amenable to PCI and would restore flow to PLA branches but would jeopardize SVG-RCA - Med Rx   Deviated nasal septum    Dilated aortic root (HCC)    Diverticulitis of sigmoid colon 07/24/2017   DM (diabetes mellitus), type 2, uncontrolled with complications (HCC)    onset 2013   Dyslipidemia    Edema 02/09/2013   GERD (gastroesophageal reflux disease)    Hx of Doppler ultrasound    Carotid US (9/13): No ICA stenosis   Hyperlipidemia 10/24/2011   Hypertension    Pleural thickening 07/24/2017    Patient Active Problem List   Diagnosis Date Noted   Ischemic cardiomyopathy 10/12/2017   Mild pulmonary hypertension (HCC) 10/12/2017   S/P CABG (coronary artery bypass graft) 10/01/2017   Status post coronary artery stent placement    Left ventricular systolic dysfunction 09/30/2017   Diverticulitis of sigmoid colon 07/24/2017   Chronic systolic CHF (congestive heart failure) (HCC) 07/24/2017   CKD (chronic kidney disease), stage III 07/24/2017   Pleural thickening 07/24/2017   Aortic atherosclerosis (HCC) 07/07/2017   Abnormal nuclear cardiac imaging test 11/14/2013   Abnormal EKG 03/09/2013   Dilated aortic root (HCC)    Edema 02/09/2013   Hypertension 10/24/2011   Coronary artery disease 10/24/2011   Hyperlipidemia 10/24/2011  DM (diabetes mellitus), type 2, uncontrolled with complications (HCC) 10/24/2011   GERD (gastroesophageal reflux disease) 10/24/2011    Past Surgical History:  Procedure Laterality Date   CORONARY ARTERY BYPASS GRAFT  10/27/2011   Procedure: CORONARY ARTERY BYPASS GRAFTING (CABG);  Surgeon: Kerin Perna, MD;  Location: [facility] OR;  Service: Open Heart Surgery;  Laterality: N/A;  Coronary Artery Bypass Grafting times four using left internal mammary artery and right greater saphenous vein endoscopically harvested   CORONARY  STENT INTERVENTION N/A 09/30/2017   Procedure: CORONARY STENT INTERVENTION;  Surgeon: Corky Crafts, MD;  Location: [facility] INVASIVE CV LAB;  Service: Cardiovascular;  Laterality: N/A;   KNEE ARTHROSCOPY Left 2003   KNEE ARTHROSCOPY Right 2006   LEFT HEART CATHETERIZATION WITH CORONARY ANGIOGRAM N/A 10/23/2011   Procedure: LEFT HEART CATHETERIZATION WITH CORONARY ANGIOGRAM;  Surgeon: Quintella Reichert, MD;  Location: [facility] CATH LAB;  Service: Cardiovascular;  Laterality: N/A;   LEFT HEART CATHETERIZATION WITH CORONARY ANGIOGRAM N/A 11/16/2013   Procedure: LEFT HEART CATHETERIZATION WITH CORONARY ANGIOGRAM;  Surgeon: Micheline Chapman, MD;  Location: [facility] CATH LAB;  Service: Cardiovascular;  Laterality: N/A;   RIGHT/LEFT HEART CATH AND CORONARY/GRAFT ANGIOGRAPHY N/A 09/30/2017   Procedure: RIGHT/LEFT HEART CATH AND CORONARY/GRAFT ANGIOGRAPHY;  Surgeon: Corky Crafts, MD;  Location: [facility] INVASIVE CV LAB;  Service: Cardiovascular;  Laterality: N/A;        Home Medications    Prior to Admission medications   Medication Sig Start Date End Date Taking? Authorizing Provider  acetaminophen (TYLENOL) 500 MG tablet Take 1,000 mg by mouth every 6 (six) hours as needed for headache.    [provider]  aspirin 81 MG tablet Take 81 mg by mouth at bedtime.     [provider]  atorvastatin (LIPITOR) 80 MG tablet Take 1 tablet (80 mg total) by mouth daily. 10/01/17 10/01/18  Kroeger, Ovidio Kin., PA-C  cholecalciferol (VITAMIN D) 1000 units tablet Take 1,000 Units by mouth daily.    [provider]  ezetimibe (ZETIA) 10 MG tablet Take 1 tablet (10 mg total) by mouth daily. 09/24/17   Bhagat, Bhavinkumar, PA  glimepiride (AMARYL) 4 MG tablet Take 4 mg by mouth daily with breakfast.    [provider]  insulin lispro (HUMALOG) 100 UNIT/ML injection Inject 10 Units into the skin 2 (two) times daily.     [provider]  iron polysaccharides (NIFEREX) 150 MG capsule Take 1  capsule (150 mg total) by mouth daily. 07/30/17   Lenox Ponds, MD  isosorbide mononitrate (IMDUR) 60 MG 24 hr tablet Take 60 mg by mouth daily.    [provider]  metFORMIN (GLUCOPHAGE) 1000 MG tablet Take 1 tablet (1,000 mg total) by mouth 2 (two) times daily with a meal. 07/30/17 02/16/18  Randel Pigg, Dorma Russell, MD  metoprolol succinate (TOPROL-XL) 100 MG 24 hr tablet Take 125 mg by mouth daily.  08/26/17   [provider]  Multiple Vitamin (MULTIVITAMIN WITH MINERALS) TABS tablet Take 1 tablet by mouth daily.    [provider]  nitroGLYCERIN (NITROSTAT) 0.4 MG SL tablet Place 1 tablet (0.4 mg total) under the tongue every 5 (five) minutes as needed for chest pain. 10/01/17 10/01/18  Kroeger, Ovidio Kin., PA-C  Omega 3 1200 MG CAPS Take 2,400 mg by mouth daily.    [provider]  polyethylene glycol (MIRALAX) packet Take 17 g by mouth daily. 07/30/17   Lenox Ponds, MD  potassium chloride (K-DUR) 10 MEQ tablet  Take 10 meq, by mouth, on Monday, Wednesday and Friday only, with torsemide. 01/05/18   Bhagat, Sharrell Ku, PA  pramipexole (MIRAPEX) 0.75 MG tablet Take 1.5 mg by mouth every evening.     [provider]  ticagrelor (BRILINTA) 90 MG TABS tablet Take 1 tablet (90 mg total) by mouth 2 (two) times daily. 10/01/17   Kroeger, Ovidio Kin., PA-C  torsemide (DEMADEX) 20 MG tablet Take 20 mg tablet, twice daily, by mouth on Monday, Wednesday and Friday only. 01/05/18   Bhagat, Bhavinkumar, PA  TOUJEO SOLOSTAR 300 UNIT/ML SOPN Inject 1 Dose into the skin at bedtime. 01/04/18   [provider]    Family History Family History  Problem Relation Age of Onset   Hypertension Mother    Stroke Father    Hypertension Father    Hypertension Paternal Grandfather    Heart attack Neg Hx     Social History Social History   Tobacco Use   Smoking status: Never Smoker   Smokeless tobacco: Never Used  Substance Use Topics   Alcohol  use: Yes    Comment: 09/30/2017 "1 beer a month or less"   Drug use: Never     Allergies   Lisinopril   Review of Systems Review of Systems  Constitutional: Positive for fever. Negative for chills.  HENT: Negative for ear pain and sore throat.   Eyes: Negative for pain and visual disturbance.  Respiratory: Positive for cough and shortness of breath.   Cardiovascular: Negative for chest pain and palpitations.  Gastrointestinal: Negative for abdominal pain and vomiting.  Genitourinary: Negative for dysuria and hematuria.  Musculoskeletal: Negative for arthralgias and back pain.  Skin: Negative for color change and rash.  Neurological: Negative for seizures and syncope.  All other systems reviewed and are negative.    Physical Exam Updated Vital Signs  ED Triage Vitals  Enc Vitals Group     BP 12/20/18 1616 (!) 174/90     Pulse Rate 12/20/18 1616 74     Resp 12/20/18 1616 17     Temp 12/20/18 1616 98.5 F (36.9 C)     Temp Source 12/20/18 1616 Oral     SpO2 12/20/18 1616 98 %     Weight --      Height --      Head Circumference --      Peak Flow --      Pain Score 12/20/18 1611 0     Pain Loc --      Pain Edu? --      Excl. in GC? --     Physical Exam Vitals signs and nursing note reviewed.  Constitutional:      Appearance: He is well-developed.  HENT:     Head: Normocephalic and atraumatic.     Mouth/Throat:     Mouth: Mucous membranes are moist.  Eyes:     Extraocular Movements: Extraocular movements intact.     Conjunctiva/sclera: Conjunctivae normal.     Pupils: Pupils are equal, round, and reactive to light.  Neck:     Musculoskeletal: Normal range of motion and neck supple.  Cardiovascular:     Rate and Rhythm: Normal rate and regular rhythm.     Pulses: Normal pulses.     Heart sounds: Normal heart sounds. No murmur.  Pulmonary:     Effort: Pulmonary effort is normal. No tachypnea or respiratory distress.     Breath sounds: Normal breath sounds.  No decreased breath sounds, wheezing, rhonchi or rales.  Abdominal:     Palpations: Abdomen is soft.     Tenderness: There is no abdominal tenderness.  Musculoskeletal:     Right lower leg: He exhibits no tenderness. Edema (2+) present.     Left lower leg: Edema (2+) present.  Skin:    General: Skin is warm and dry.  Neurological:     General: No focal deficit present.     Mental Status: He is alert.  Psychiatric:        Mood and Affect: Mood normal.      ED Treatments / Results  Labs (all labs ordered are listed, but only abnormal results are displayed) Labs Reviewed  BASIC METABOLIC PANEL - Abnormal; Notable for the following components:      Result Value   Sodium 133 (*)    Glucose, Bld 327 (*)    Creatinine, Ser 1.34 (*)    Calcium 8.5 (*)    GFR calc non Af Amer 52 (*)    All other components within normal limits  BRAIN NATRIURETIC PEPTIDE - Abnormal; Notable for the following components:   B Natriuretic Peptide 246.5 (*)    All other components within normal limits  CBG MONITORING, ED - Abnormal; Notable for the following components:   Glucose-Capillary 203 (*)    All other components within normal limits  TROPONIN I (HIGH SENSITIVITY) - Abnormal; Notable for the following components:   Troponin I (High Sensitivity) 21 (*)    All other components within normal limits  NOVEL CORONAVIRUS, NAA (HOSP ORDER, SEND-OUT TO REF LAB; TAT 18-24 HRS)  CBC  TROPONIN I (HIGH SENSITIVITY)    EKG EKG Interpretation  Date/Time:  Monday December 20 2018 16:13:01 EST Ventricular Rate:  77 PR Interval:  198 QRS Duration: 92 QT Interval:  412 QTC Calculation: 466 R Axis:   29 Text Interpretation: Normal sinus rhythm Cannot rule out Anterior infarct , age undetermined Abnormal ECG Confirmed by Virgina NorfolkAdam, Akili Corsetti [phone]([id number]) on 12/20/2018 4:48:31 PM   Radiology Dg Chest 2 View  Result Date: 12/20/2018 CLINICAL DATA:  Shortness of breath, cough EXAM: CHEST - 2 VIEW COMPARISON:   07/29/2017, 12/08/2013, CT 07/24/2017 FINDINGS: Post sternotomy changes. The right lung is grossly clear. Suspected chronic pleural changes at the left lateral lung base. No pneumothorax. Mild cardiomegaly. Subsegmental atelectasis or scar in the left upper lobe. IMPRESSION: 1. Cardiomegaly.  Atelectasis or scarring in the left upper lobe. 2. Left CP angle opacity, likely corresponds to pleural changes noted on CT 07/24/2017. Electronically Signed   By: Jasmine PangKim  Fujinaga M.D.   On: 12/20/2018 16:48   Ct Angio Chest Pe W And/or Wo Contrast  Result Date: 12/20/2018 CLINICAL DATA:  Chest pain, complex, intermediate/high probability acute coronary syndrome pulmonary embolus. Shortness of breath and cough since yesterday. EXAM: CT ANGIOGRAPHY CHEST WITH CONTRAST TECHNIQUE: Multidetector CT imaging of the chest was performed using the standard protocol during bolus administration of intravenous contrast. Multiplanar CT image reconstructions and MIPs were obtained to evaluate the vascular anatomy. CONTRAST:  75mL OMNIPAQUE IOHEXOL 350 MG/ML SOLN COMPARISON:  Two-view chest x-ray 12/20/2018 FINDINGS: Cardiovascular: The heart is enlarged. Coronary artery calcifications are present. There is no pericardial effusion. Atherosclerotic changes are noted in the aorta without aneurysm or definite stenosis of the great vessel origins. Main pulmonary outflow tract is enlarged, measuring 4.9 cm. Right and left pulmonary arteries are enlarged as well. There are no focal filling defects to suggest pulmonary embolus. Mediastinum/Nodes: No significant mediastinal, hilar, or axillary adenopathy is present. Lungs/Pleura:  This pleural thickening in focal airspace consolidation at the left base. There is no significant interval change. Indistinct 3 mm peripheral nodule is stable and requires no additional follow-up minimal atelectasis is present dependently in the right lung. No other focal nodule, mass, or airspace disease is present. Upper  Abdomen: The visualized upper abdomen is within normal limits. Musculoskeletal: Vertebral body heights alignment are maintained. The patient is status post median sternotomy for CABG. Review of the MIP images confirms the above findings. IMPRESSION: 1. No evidence for pulmonary embolus. 2. Enlarged pulmonary arteries compatible with pulmonary arterial hypertension. 3. Cardiomegaly without failure. 4. Coronary artery disease. 5. Aortic Atherosclerosis (ICD10-I70.0). 6. Similar appearance of consolidated airspace disease and pleural thickening at the left base. This likely reflects chronic rounded atelectasis. Recommend additional 1 year follow-up to assure stability. If the patient has additional risk factors, PET scan could be used for further evaluation on a shorter interval. Electronically Signed   By: San Morelle M.D.   On: 12/20/2018 20:58    Procedures Procedures (including critical care time)  Medications Ordered in ED Medications  sodium chloride flush (NS) 0.9 % injection 3 mL (3 mLs Intravenous Given 12/20/18 1726)  iohexol (OMNIPAQUE) 350 MG/ML injection 75 mL (75 mLs Intravenous Contrast Given 12/20/18 2013)     Initial Impression / Assessment and Plan / ED Course  I have reviewed the triage vital signs and the nursing notes.  Pertinent labs & imaging results that were available during my care of the patient were reviewed by me and considered in my medical decision making (see chart for details).     James [name] is a 73 year old male with history of hypertension, CAD, heart failure who presents to the ED with shortness of breath.  Patient with overall unremarkable vitals.  Has had some shortness of breath over the last 2 days.  Concern for coronavirus as he is had some chills, body aches.  No known exposure but his daughter was recently in the hospital.  Patient denies any active chest pain.  Has some chronic swelling in his legs.  About 2+ edema bilaterally.  Does have  history of heart failure.  States that he is compliant with his medications.  No history of PE.  EKG shows sinus rhythm.  Chest x-ray overall unremarkable.  Troponin unremarkable, doubt ACS.  Overall no chest pain, EKG reassuring.  Troponin stable x2.  CT scan of his chest showed no pulmonary embolism.  Did show some pulmonary hypertension.  No obvious heart failure symptoms.  Does have some stable pleural thickening at the left lower base.  Recommend that he follow-up with his primary care doctor for repeat CT scan in a year.  Overall believe symptoms are likely secondary to some mild heart failure exacerbation, pulmonary hypertension.  Recommend increase torsemide for the next 3 days and close follow-up with cardiology/PCP.  Given return precautions.  Will swab for coronavirus.  Knows to self isolate at home until he hears back from results.  Discharged in good condition.  Understands return precautions.  This chart was dictated using voice recognition software.  Despite best efforts to proofread,  errors can occur which can change the documentation meaning.   James [name] was evaluated in Emergency Department on 12/20/2018 for the symptoms described in the history of present illness. He was evaluated in the context of the global COVID-19 pandemic, which necessitated consideration that the patient might be at risk for infection with the SARS-CoV-2 virus that causes COVID-19. Institutional  protocols and algorithms that pertain to the evaluation of patients at risk for COVID-19 are in a state of rapid change based on information released by regulatory bodies including the CDC and federal and state organizations. These policies and algorithms were followed during the patient's care in the ED.  Final Clinical Impressions(s) / ED Diagnoses   Final diagnoses:  Shortness of breath    ED Discharge Orders    None       Virgina Norfolk, DO 12/20/18 2201    Virgina Norfolk, DO 12/20/18 2201

## 2018-12-20 NOTE — Discharge Instructions (Addendum)
Self isolate until you hear back from your coronavirus test.  Follow-up with a cardiologist as discussed.  Increase your torsemide to 20 mg twice daily.  Return if symptoms get worse.  Follow-up with your primary care doctor as you will need repeat CT scan of your chest to further evaluate for stability of left lower lung atelectasis as discussed.

## 2018-12-20 NOTE — ED Notes (Signed)
Patient transported to CT 

## 2018-12-20 NOTE — ED Triage Notes (Signed)
Pt arrived [facility] from home for c/o SOB, chill, and cough. Hx CHF, BLE edema present BP 218/140 P 78 O2 98

## 2018-12-22 LAB — NOVEL CORONAVIRUS, NAA (HOSP ORDER, SEND-OUT TO REF LAB; TAT 18-24 HRS): SARS-CoV-2, NAA: NOT DETECTED

## 2018-12-27 DIAGNOSIS — E782 Mixed hyperlipidemia: Secondary | ICD-10-CM | POA: Diagnosis not present

## 2018-12-27 DIAGNOSIS — E1121 Type 2 diabetes mellitus with diabetic nephropathy: Secondary | ICD-10-CM | POA: Diagnosis not present

## 2018-12-27 DIAGNOSIS — I130 Hypertensive heart and chronic kidney disease with heart failure and stage 1 through stage 4 chronic kidney disease, or unspecified chronic kidney disease: Secondary | ICD-10-CM | POA: Diagnosis not present

## 2018-12-27 DIAGNOSIS — I25119 Atherosclerotic heart disease of native coronary artery with unspecified angina pectoris: Secondary | ICD-10-CM | POA: Diagnosis not present

## 2019-01-12 ENCOUNTER — Other Ambulatory Visit: Payer: Self-pay | Admitting: Physician Assistant

## 2019-01-12 MED FILL — METFORMIN HCL ER 500 MG TB2: 500 | 30 days supply | Qty: 30 | Fill #1

## 2019-01-12 MED FILL — EZETIMIBE 10 MG TABS: 10 | 30 days supply | Qty: 30 | Fill #0

## 2019-01-12 MED FILL — METOPROLOL SUCCINATE ER 100: 100 | 90 days supply | Qty: 90 | Fill #2

## 2019-01-12 MED FILL — BD PEN NDL SHORT 31GX5/16": 31G X 8 MM | 50 days supply | Qty: 100 | Fill #0

## 2019-01-12 MED FILL — PRAMIPEXOLE 0.75 MG TABLET: 0.75 | 90 days supply | Qty: 180 | Fill #1

## 2019-01-12 MED FILL — TOUJEO MAX SOLOSTAR 300 UNI: 300 | 38 days supply | Qty: 3 | Fill #0

## 2019-01-20 DIAGNOSIS — E119 Type 2 diabetes mellitus without complications: Secondary | ICD-10-CM | POA: Diagnosis not present

## 2019-01-20 DIAGNOSIS — I25810 Atherosclerosis of coronary artery bypass graft(s) without angina pectoris: Secondary | ICD-10-CM | POA: Diagnosis not present

## 2019-01-20 DIAGNOSIS — E118 Type 2 diabetes mellitus with unspecified complications: Secondary | ICD-10-CM | POA: Diagnosis not present

## 2019-01-20 DIAGNOSIS — E1165 Type 2 diabetes mellitus with hyperglycemia: Secondary | ICD-10-CM | POA: Diagnosis not present

## 2019-01-20 DIAGNOSIS — E785 Hyperlipidemia, unspecified: Secondary | ICD-10-CM | POA: Diagnosis not present

## 2019-01-20 DIAGNOSIS — E11319 Type 2 diabetes mellitus with unspecified diabetic retinopathy without macular edema: Secondary | ICD-10-CM | POA: Diagnosis not present

## 2019-02-05 ENCOUNTER — Inpatient Hospital Stay (HOSPITAL_COMMUNITY)
Admission: EM | Admit: 2019-02-05 | Discharge: 2019-02-07 | DRG: 065 | Disposition: A | Discharge status: Home / Self Care | Payer: Medicare HMO | Attending: Internal Medicine | Admitting: Internal Medicine

## 2019-02-05 ENCOUNTER — Other Ambulatory Visit: Payer: Self-pay

## 2019-02-05 ENCOUNTER — Encounter (HOSPITAL_COMMUNITY): Payer: Self-pay | Admitting: Emergency Medicine

## 2019-02-05 DIAGNOSIS — J181 Lobar pneumonia, unspecified organism: Secondary | ICD-10-CM | POA: Diagnosis not present

## 2019-02-05 DIAGNOSIS — E1165 Type 2 diabetes mellitus with hyperglycemia: Secondary | ICD-10-CM | POA: Diagnosis not present

## 2019-02-05 DIAGNOSIS — Z794 Long term (current) use of insulin: Secondary | ICD-10-CM | POA: Diagnosis not present

## 2019-02-05 DIAGNOSIS — Z20828 Contact with and (suspected) exposure to other viral communicable diseases: Secondary | ICD-10-CM | POA: Diagnosis not present

## 2019-02-05 DIAGNOSIS — M1990 Unspecified osteoarthritis, unspecified site: Secondary | ICD-10-CM | POA: Diagnosis present

## 2019-02-05 DIAGNOSIS — Z79899 Other long term (current) drug therapy: Secondary | ICD-10-CM

## 2019-02-05 DIAGNOSIS — I152 Hypertension secondary to endocrine disorders: Secondary | ICD-10-CM | POA: Diagnosis present

## 2019-02-05 DIAGNOSIS — N183 Chronic kidney disease, stage 3 unspecified: Secondary | ICD-10-CM | POA: Diagnosis present

## 2019-02-05 DIAGNOSIS — I5022 Chronic systolic (congestive) heart failure: Secondary | ICD-10-CM | POA: Diagnosis present

## 2019-02-05 DIAGNOSIS — Z951 Presence of aortocoronary bypass graft: Secondary | ICD-10-CM

## 2019-02-05 DIAGNOSIS — R609 Edema, unspecified: Secondary | ICD-10-CM | POA: Diagnosis not present

## 2019-02-05 DIAGNOSIS — Z6835 Body mass index (BMI) 35.0-35.9, adult: Secondary | ICD-10-CM

## 2019-02-05 DIAGNOSIS — R29702 NIHSS score 2: Secondary | ICD-10-CM | POA: Diagnosis present

## 2019-02-05 DIAGNOSIS — E1169 Type 2 diabetes mellitus with other specified complication: Secondary | ICD-10-CM | POA: Diagnosis present

## 2019-02-05 DIAGNOSIS — I130 Hypertensive heart and chronic kidney disease with heart failure and stage 1 through stage 4 chronic kidney disease, or unspecified chronic kidney disease: Secondary | ICD-10-CM | POA: Diagnosis present

## 2019-02-05 DIAGNOSIS — G2581 Restless legs syndrome: Secondary | ICD-10-CM | POA: Diagnosis present

## 2019-02-05 DIAGNOSIS — Z9119 Patient's noncompliance with other medical treatment and regimen: Secondary | ICD-10-CM

## 2019-02-05 DIAGNOSIS — Z66 Do not resuscitate: Secondary | ICD-10-CM | POA: Diagnosis present

## 2019-02-05 DIAGNOSIS — K219 Gastro-esophageal reflux disease without esophagitis: Secondary | ICD-10-CM | POA: Diagnosis present

## 2019-02-05 DIAGNOSIS — I639 Cerebral infarction, unspecified: Secondary | ICD-10-CM | POA: Diagnosis not present

## 2019-02-05 DIAGNOSIS — E785 Hyperlipidemia, unspecified: Secondary | ICD-10-CM | POA: Diagnosis not present

## 2019-02-05 DIAGNOSIS — I371 Nonrheumatic pulmonary valve insufficiency: Secondary | ICD-10-CM | POA: Diagnosis not present

## 2019-02-05 DIAGNOSIS — R29818 Other symptoms and signs involving the nervous system: Secondary | ICD-10-CM | POA: Diagnosis not present

## 2019-02-05 DIAGNOSIS — I6381 Other cerebral infarction due to occlusion or stenosis of small artery: Secondary | ICD-10-CM | POA: Diagnosis not present

## 2019-02-05 DIAGNOSIS — Z8249 Family history of ischemic heart disease and other diseases of the circulatory system: Secondary | ICD-10-CM | POA: Diagnosis not present

## 2019-02-05 DIAGNOSIS — E669 Obesity, unspecified: Secondary | ICD-10-CM | POA: Diagnosis present

## 2019-02-05 DIAGNOSIS — E1136 Type 2 diabetes mellitus with diabetic cataract: Secondary | ICD-10-CM | POA: Diagnosis present

## 2019-02-05 DIAGNOSIS — I491 Atrial premature depolarization: Secondary | ICD-10-CM | POA: Diagnosis not present

## 2019-02-05 DIAGNOSIS — I10 Essential (primary) hypertension: Secondary | ICD-10-CM | POA: Diagnosis present

## 2019-02-05 DIAGNOSIS — I700 Atherosclerosis of aorta: Secondary | ICD-10-CM | POA: Diagnosis present

## 2019-02-05 DIAGNOSIS — I712 Thoracic aortic aneurysm, without rupture: Secondary | ICD-10-CM | POA: Diagnosis not present

## 2019-02-05 DIAGNOSIS — E1122 Type 2 diabetes mellitus with diabetic chronic kidney disease: Secondary | ICD-10-CM | POA: Diagnosis present

## 2019-02-05 DIAGNOSIS — E1159 Type 2 diabetes mellitus with other circulatory complications: Secondary | ICD-10-CM | POA: Diagnosis present

## 2019-02-05 DIAGNOSIS — Z7982 Long term (current) use of aspirin: Secondary | ICD-10-CM | POA: Diagnosis not present

## 2019-02-05 DIAGNOSIS — I2510 Atherosclerotic heart disease of native coronary artery without angina pectoris: Secondary | ICD-10-CM | POA: Diagnosis present

## 2019-02-05 DIAGNOSIS — Z888 Allergy status to other drugs, medicaments and biological substances status: Secondary | ICD-10-CM | POA: Diagnosis not present

## 2019-02-05 DIAGNOSIS — R2990 Unspecified symptoms and signs involving the nervous system: Secondary | ICD-10-CM | POA: Diagnosis not present

## 2019-02-05 DIAGNOSIS — I5042 Chronic combined systolic (congestive) and diastolic (congestive) heart failure: Secondary | ICD-10-CM | POA: Diagnosis present

## 2019-02-05 DIAGNOSIS — I509 Heart failure, unspecified: Secondary | ICD-10-CM

## 2019-02-05 DIAGNOSIS — E66812 Obesity, class 2: Secondary | ICD-10-CM | POA: Diagnosis present

## 2019-02-05 DIAGNOSIS — N1831 Chronic kidney disease, stage 3a: Secondary | ICD-10-CM | POA: Diagnosis not present

## 2019-02-05 DIAGNOSIS — IMO0002 Reserved for concepts with insufficient information to code with codable children: Secondary | ICD-10-CM | POA: Diagnosis present

## 2019-02-05 DIAGNOSIS — Z9114 Patient's other noncompliance with medication regimen: Secondary | ICD-10-CM

## 2019-02-05 DIAGNOSIS — Z823 Family history of stroke: Secondary | ICD-10-CM

## 2019-02-05 DIAGNOSIS — R200 Anesthesia of skin: Secondary | ICD-10-CM | POA: Diagnosis not present

## 2019-02-05 DIAGNOSIS — R0902 Hypoxemia: Secondary | ICD-10-CM | POA: Diagnosis not present

## 2019-02-05 MED ORDER — PRAMIPEXOLE DIHYDROCHLORIDE 1.5 MG PO TABS
1.5000 mg | ORAL_TABLET | Freq: Once | ORAL | Status: AC
  Administered 2019-02-06: 1.5 mg via ORAL
  Filled 2019-02-05: qty 1

## 2019-02-05 NOTE — ED Provider Notes (Signed)
[facility] EMERGENCY DEPARTMENT Provider Note  CSN: [id number] Arrival date & time: 02/05/19 2230  Chief Complaint(s) Numbness (left side)  HPI James [name] is a 73 y.o. male with extensive past medical history listed below who presents to the emergency department with sudden onset left upper extremity numbness at 3 PM that has been persistent since onset.  Patient denied any weakness.  Reports that the arm feels like it is asleep.  Several hours later he began feeling left lower extremity numbness but that has since resolved.  He denies any falls or trauma.  Denies any associated chest pain or shortness of breath.  No recent fevers or infections.  No visual disturbance.  No change in ability to ambulate.  Denies any prior strokes but reports he might of had a TIA in the past.  Mostly complains of his restless leg syndrome and requesting his medication for this.  HPI  Past Medical History Past Medical History:  Diagnosis Date  . Abnormal EKG 03/09/2013  . Abnormal nuclear cardiac imaging test 11/14/2013  . Aortic atherosclerosis (HCC) 07/07/2017  . Arthritis    "all over" (09/30/2017)  . Ascending aortic aneurysm (HCC)    cMRI (2/15): Normal LV EF 54%, Mild BAE, Trileaflet Aortic Valve, Upper limits of normal ascending aorta 3.8 cm, Normal aortic arch 2.3 cm with bovine origin of left carotid  . Atrial flutter (HCC)    Post-op with no reoccurence  . Chronic diastolic heart failure (HCC)    Echo (1/15): Left ventricle: The cavity size was mildly dilated. Mild LVH. EF 55% to 60%. Wall motion was normal; Gr 1 diastolic dysfunction Left atrium: The atrium was mildly dilated. Right atrium: The atrium was mildly to moderately dilated. Aortic arch measures 4.8 cm (moderately dilated); suggest CTA or MRA to better assess.  . Chronic systolic CHF (congestive heart failure) (HCC) 07/24/2017  . CKD (chronic kidney disease), stage III 07/24/2017  . Coronary artery disease 10/2011     a. s/p cabg;  b. Myoview (9/15):  ant ischemia, poss TID, EF 43%, high risk >> LHC (9/15): Dist LM 50, pLAD 90 then 100, pCFX 50, mCFX 75, oRCA 75, dRCA 95, L-LAD patent, S-Dx 100, S-OM1 patent, S-PDA patent, EF 55% >> native Dx small caliber and not well suited for PCI; native RCA amenable to PCI and would restore flow to PLA branches but would jeopardize SVG-RCA - Med Rx  . Deviated nasal septum   . Dilated aortic root (HCC)   . Diverticulitis of sigmoid colon 07/24/2017  . DM (diabetes mellitus), type 2, uncontrolled with complications (HCC)    onset 2013  . Dyslipidemia   . Edema 02/09/2013  . GERD (gastroesophageal reflux disease)   . Hx of Doppler ultrasound    Carotid US (9/13): No ICA stenosis  . Hyperlipidemia 10/24/2011  . Hypertension   . Pleural thickening 07/24/2017   Patient Active Problem List   Diagnosis Date Noted  . Left sided numbness 02/06/2019  . Ischemic cardiomyopathy 10/12/2017  . Mild pulmonary hypertension (HCC) 10/12/2017  . S/P CABG (coronary artery bypass graft) 10/01/2017  . Status post coronary artery stent placement   . Left ventricular systolic dysfunction 09/30/2017  . Diverticulitis of sigmoid colon 07/24/2017  . Chronic systolic CHF (congestive heart failure) (HCC) 07/24/2017  . CKD (chronic kidney disease), stage III 07/24/2017  . Pleural thickening 07/24/2017  . Aortic atherosclerosis (HCC) 07/07/2017  . Abnormal nuclear cardiac imaging test 11/14/2013  . Abnormal EKG 03/09/2013  .  Dilated aortic root ([place])   . Edema 02/09/2013  . Hypertension 10/24/2011  . Coronary artery disease 10/24/2011  . Hyperlipidemia 10/24/2011  . DM (diabetes mellitus), type 2, uncontrolled with complications ([place]) 75/11/2583  . GERD (gastroesophageal reflux disease) 10/24/2011   Home Medication(s) Prior to Admission medications   Medication Sig Start Date End Date Taking? Authorizing Provider  acetaminophen (TYLENOL) 500 MG tablet Take 1,000 mg by mouth every  6 (six) hours as needed for headache.    [provider]  aspirin 81 MG tablet Take 81 mg by mouth at bedtime.     [provider]  atorvastatin (LIPITOR) 80 MG tablet Take 1 tablet (80 mg total) by mouth daily. 10/01/17 02/06/19  Kroeger, Lorelee Cover., PA-C  cholecalciferol (VITAMIN D) 1000 units tablet Take 1,000 Units by mouth daily.    [provider]  ezetimibe (ZETIA) 10 MG tablet Take 1 tablet (10 mg total) by mouth daily. Please make overdue appt with Dr. Radford Pax before anymore refills. 1st attempt Patient taking differently: Take 10 mg by mouth daily.  01/12/19   Bhagat, Crista Luria, PA  glimepiride (AMARYL) 4 MG tablet Take 4 mg by mouth daily with breakfast.    [provider]  insulin lispro (HUMALOG) 100 UNIT/ML injection Inject 10 Units into the skin 2 (two) times daily.     [provider]  iron polysaccharides (NIFEREX) 150 MG capsule Take 1 capsule (150 mg total) by mouth daily. 07/30/17   Doreatha Lew, MD  isosorbide mononitrate (IMDUR) 60 MG 24 hr tablet Take 60 mg by mouth daily.    [provider]  metFORMIN (GLUCOPHAGE) 1000 MG tablet Take 1 tablet (1,000 mg total) by mouth 2 (two) times daily with a meal. 07/30/17 02/06/19  Patrecia Pour, Christean Grief, MD  metoprolol succinate (TOPROL-XL) 100 MG 24 hr tablet Take 125 mg by mouth daily.  08/26/17   [provider]  Multiple Vitamin (MULTIVITAMIN WITH MINERALS) TABS tablet Take 1 tablet by mouth daily.    [provider]  nitroGLYCERIN (NITROSTAT) 0.4 MG SL tablet Place 1 tablet (0.4 mg total) under the tongue every 5 (five) minutes as needed for chest pain. 10/01/17 02/06/19  Kroeger, Lorelee Cover., PA-C  Omega 3 1200 MG CAPS Take 2,400 mg by mouth daily.    [provider]  polyethylene glycol (MIRALAX) packet Take 17 g by mouth daily. 07/30/17   Doreatha Lew, MD  potassium chloride (K-DUR) 10 MEQ tablet Take 10 meq, by mouth, on Monday, Wednesday and  Friday only, with torsemide. 01/05/18   Bhagat, Crista Luria, PA  pramipexole (MIRAPEX) 0.75 MG tablet Take 1.5 mg by mouth every evening.     [provider]  ticagrelor (BRILINTA) 90 MG TABS tablet Take 1 tablet (90 mg total) by mouth 2 (two) times daily. 10/01/17   Kroeger, Lorelee Cover., PA-C  torsemide (DEMADEX) 20 MG tablet Take 20 mg tablet, twice daily, by mouth on Monday, Wednesday and Friday only. 01/05/18   Bhagat, Bhavinkumar, PA  TOUJEO SOLOSTAR 300 UNIT/ML SOPN Inject 1 Dose into the skin at bedtime. 01/04/18   [provider]  Past Surgical History Past Surgical History:  Procedure Laterality Date  . CORONARY ARTERY BYPASS GRAFT  10/27/2011   Procedure: CORONARY ARTERY BYPASS GRAFTING (CABG);  Surgeon: Kerin Perna, MD;  Location: [facility] OR;  Service: Open Heart Surgery;  Laterality: N/A;  Coronary Artery Bypass Grafting times four using left internal mammary artery and right greater saphenous vein endoscopically harvested  . CORONARY STENT INTERVENTION N/A 09/30/2017   Procedure: CORONARY STENT INTERVENTION;  Surgeon: Corky Crafts, MD;  Location: [facility] INVASIVE CV LAB;  Service: Cardiovascular;  Laterality: N/A;  . KNEE ARTHROSCOPY Left 2003  . KNEE ARTHROSCOPY Right 2006  . LEFT HEART CATHETERIZATION WITH CORONARY ANGIOGRAM N/A 10/23/2011   Procedure: LEFT HEART CATHETERIZATION WITH CORONARY ANGIOGRAM;  Surgeon: Quintella Reichert, MD;  Location: [facility] CATH LAB;  Service: Cardiovascular;  Laterality: N/A;  . LEFT HEART CATHETERIZATION WITH CORONARY ANGIOGRAM N/A 11/16/2013   Procedure: LEFT HEART CATHETERIZATION WITH CORONARY ANGIOGRAM;  Surgeon: Micheline Chapman, MD;  Location: [facility] CATH LAB;  Service: Cardiovascular;  Laterality: N/A;  . RIGHT/LEFT HEART CATH AND CORONARY/GRAFT ANGIOGRAPHY N/A 09/30/2017   Procedure: RIGHT/LEFT HEART CATH AND  CORONARY/GRAFT ANGIOGRAPHY;  Surgeon: Corky Crafts, MD;  Location: [facility] INVASIVE CV LAB;  Service: Cardiovascular;  Laterality: N/A;   Family History Family History  Problem Relation Age of Onset  . Hypertension Mother   . Stroke Father   . Hypertension Father   . Hypertension Paternal Grandfather   . Heart attack Neg Hx     Social History Social History   Tobacco Use  . Smoking status: Never Smoker  . Smokeless tobacco: Never Used  Substance Use Topics  . Alcohol use: Yes    Comment: 09/30/2017 "1 beer a month or less"  . Drug use: Never   Allergies Lisinopril  Review of Systems Review of Systems All other systems are reviewed and are negative for acute change except as noted in the HPI  Physical Exam Vital Signs  I have reviewed the triage vital signs BP (!) 194/94 (BP Location: Left Arm)   Pulse 81   Temp 98 F (36.7 C) (Oral)   Resp 20   SpO2 97% Comment: Simultaneous filing. User may not have seen previous data.   Physical Exam Vitals reviewed.  Constitutional:      General: He is not in acute distress.    Appearance: He is well-developed. He is not diaphoretic.  HENT:     Head: Normocephalic and atraumatic.     Nose: Nose normal.  Eyes:     General: No scleral icterus.       Right eye: No discharge.        Left eye: No discharge.     Conjunctiva/sclera: Conjunctivae normal.     Pupils: Pupils are equal, round, and reactive to light.  Cardiovascular:     Rate and Rhythm: Normal rate and regular rhythm.     Heart sounds: No murmur. No friction rub. No gallop.   Pulmonary:     Effort: Pulmonary effort is normal. No respiratory distress.     Breath sounds: Normal breath sounds. No stridor. No rales.  Abdominal:     General: There is no distension.     Palpations: Abdomen is soft.     Tenderness: There is no abdominal tenderness.  Musculoskeletal:        General: No tenderness.     Cervical back: Normal range of motion and neck supple.      Right lower leg: 2+ Pitting  Edema present.     Left lower leg: 2+ Pitting Edema present.  Skin:    General: Skin is warm and dry.     Findings: No erythema or rash.  Neurological:     Mental Status: He is alert and oriented to person, place, and time.     Comments: Mental Status:  Alert and oriented to person, place, and time.  Attention and concentration normal.  Speech clear.  Recent memory is intact  Cranial Nerves:  II Visual Fields: Intact to confrontation. Visual fields intact. III, IV, VI: Pupils equal and reactive to light and near. Full eye movement without nystagmus  V Facial Sensation: Normal. No weakness of masticatory muscles  VII: No facial weakness or asymmetry  VIII Auditory Acuity: Grossly normal  IX/X: The uvula is midline; the palate elevates symmetrically  XI: Normal sternocleidomastoid and trapezius strength  XII: The tongue is midline. No atrophy or fasciculations.   Motor System: Muscle Strength: 5/5 and symmetric in the upper and lower extremities. No pronation or drift.  Muscle Tone: Tone and muscle bulk are normal in the upper and lower extremities.   Reflexes: DTRs: 1+ and symmetrical in all four extremities. No Clonus Coordination: Intact finger-to-nose.No tremor.  Sensation: Intact to light touch..  Gait: deferred      ED Results and Treatments Labs (all labs ordered are listed, but only abnormal results are displayed) Labs Reviewed  BRAIN NATRIURETIC PEPTIDE - Abnormal; Notable for the following components:      Result Value   B Natriuretic Peptide 238.2 (*)    All other components within normal limits  COMPREHENSIVE METABOLIC PANEL - Abnormal; Notable for the following components:   Glucose, Bld 208 (*)    Creatinine, Ser 1.37 (*)    Calcium 8.4 (*)    Albumin 3.3 (*)    GFR calc non Af Amer 51 (*)    GFR calc Af Amer 59 (*)    All other components within normal limits  URINALYSIS, ROUTINE W REFLEX MICROSCOPIC - Abnormal; Notable for the  following components:   Color, Urine STRAW (*)    Glucose, UA >=500 (*)    Hgb urine dipstick SMALL (*)    Protein, ur 100 (*)    All other components within normal limits  I-STAT CHEM 8, ED - Abnormal; Notable for the following components:   Glucose, Bld 260 (*)    Calcium, Ion 1.14 (*)    All other components within normal limits  TROPONIN I (HIGH SENSITIVITY) - Abnormal; Notable for the following components:   Troponin I (High Sensitivity) 18 (*)    All other components within normal limits  SARS CORONAVIRUS 2 (TAT 6-24 HRS)  CBC WITH DIFFERENTIAL/PLATELET  ETHANOL  PROTIME-INR  APTT  RAPID URINE DRUG SCREEN, HOSP PERFORMED  TROPONIN I (HIGH SENSITIVITY)  EKG  EKG Interpretation  Date/Time:  Saturday February 05 2019 22:37:38 EST Ventricular Rate:  83 PR Interval:    QRS Duration: 109 QT Interval:  400 QTC Calculation: 470 R Axis:   65 Text Interpretation: Sinus rhythm Ventricular trigeminy Probable left atrial enlargement Nonspecific repol abnormality, lateral leads Minimal ST elevation, inferior leads Otherwise no significant change Confirmed by Drema Pryardama, Poonam Woehrle [phone] on 02/06/2019 2:08:24 AM      Radiology CT HEAD WO CONTRAST  Result Date: 02/06/2019 CLINICAL DATA:  Focal neuro deficit, > 6 hrs, stroke suspected. Left-sided numbness. EXAM: CT HEAD WITHOUT CONTRAST TECHNIQUE: Contiguous axial images were obtained from the base of the skull through the vertex without intravenous contrast. COMPARISON:  None. FINDINGS: Brain: No intracranial hemorrhage, mass effect, or midline shift. Brain volume is normal for age. No hydrocephalus. The basilar cisterns are patent. Small remote lacunar infarct in the left basal ganglia. Minor periventricular white matter hypodensities, nonspecific but typically chronic small vessel ischemia. Possible chronic ischemic change  in the right pons. No evidence of territorial infarct or acute ischemia. No extra-axial or intracranial fluid collection. Vascular: Atherosclerosis of skullbase vasculature without hyperdense vessel or abnormal calcification. Skull: No fracture or focal lesion. Sinuses/Orbits: No acute findings. Bilateral cataract resection. Other: None. IMPRESSION: 1. No acute intracranial abnormality. 2. Small remote lacunar infarct in the left basal ganglia. Mild periventricular white matter hypodensity, nonspecific but typically chronic small vessel ischemia. Electronically Signed   By: Narda RutherfordMelanie  Sanford M.D.   On: 02/06/2019 00:47   DG Chest Port 1 View  Result Date: 02/06/2019 CLINICAL DATA:  Left-sided numbness. EXAM: PORTABLE CHEST 1 VIEW COMPARISON:  December 20, 2018 FINDINGS: The heart size remains enlarged. There is persistent consolidation at the left lung base. There is some elevation of the left hemidiaphragm. There are probable small bilateral pleural effusions. The patient is status post prior median sternotomy. There is no pneumothorax. There is vascular congestion. There is no acute osseous abnormality. IMPRESSION: 1. Persistent left basilar consolidation and probable small bilateral pleural effusions. 2. Cardiomegaly with vascular congestion. 3. Low lung volumes. Electronically Signed   By: Katherine Mantlehristopher  Green M.D.   On: 02/06/2019 00:30    Pertinent labs & imaging results that were available during my care of the patient were reviewed by me and considered in my medical decision making (see chart for details).  Medications Ordered in ED Medications  pramipexole (MIRAPEX) tablet 1.5 mg (1.5 mg Oral Given 02/06/19 0030)                                                                                                                                    Procedures Procedures  (including critical care time)  Medical Decision Making / ED Course I have reviewed the nursing notes for this encounter and the  patient's prior records (if available in EHR or on provided paperwork).   Loma Sendererry W Weyer was evaluated in Emergency Department on  02/06/2019 for the symptoms described in the history of present illness. He was evaluated in the context of the global COVID-19 pandemic, which necessitated consideration that the patient might be at risk for infection with the SARS-CoV-2 virus that causes COVID-19. Institutional protocols and algorithms that pertain to the evaluation of patients at risk for COVID-19 are in a state of rapid change based on information released by regulatory bodies including the CDC and federal and state organizations. These policies and algorithms were followed during the patient's care in the ED.  CT head notable for old stroke.  Neuro consulted who recommended admission for stroke work up.  Has h/o CHF. Has peripheral edema, but not in acute exacerbation.         Final Clinical Impression(s) / ED Diagnoses Final diagnoses:  Heart failure (HCC)  Left sided numbness      This chart was dictated using voice recognition software.  Despite best efforts to proofread,  errors can occur which can change the documentation meaning.   Nira Conn, MD 02/06/19 [phone]

## 2019-02-05 NOTE — ED Notes (Signed)
Pt refusing registration at this time. Pt requesting his medicine for "restless leg syndrome". Pt states that he is wanting to leave if he doesn't get his medication.

## 2019-02-05 NOTE — ED Triage Notes (Addendum)
Pt arrived via [facility] c/o L side numbess and moves from hand to entire arm which started at approx 3pm today. Pt also c/o diarerah for today. No weakness. Per pt he has not been taking his prescribed lasix. ABD is stiff at this time without pain or tenderness

## 2019-02-06 ENCOUNTER — Observation Stay (HOSPITAL_COMMUNITY): Payer: Medicare HMO

## 2019-02-06 ENCOUNTER — Inpatient Hospital Stay (HOSPITAL_COMMUNITY): Payer: Medicare HMO

## 2019-02-06 ENCOUNTER — Encounter (HOSPITAL_COMMUNITY): Payer: Self-pay | Admitting: Internal Medicine

## 2019-02-06 ENCOUNTER — Emergency Department (HOSPITAL_COMMUNITY): Payer: Medicare HMO

## 2019-02-06 DIAGNOSIS — E669 Obesity, unspecified: Secondary | ICD-10-CM | POA: Diagnosis present

## 2019-02-06 DIAGNOSIS — E1136 Type 2 diabetes mellitus with diabetic cataract: Secondary | ICD-10-CM | POA: Diagnosis present

## 2019-02-06 DIAGNOSIS — N1831 Chronic kidney disease, stage 3a: Secondary | ICD-10-CM | POA: Diagnosis not present

## 2019-02-06 DIAGNOSIS — M1990 Unspecified osteoarthritis, unspecified site: Secondary | ICD-10-CM | POA: Diagnosis present

## 2019-02-06 DIAGNOSIS — I371 Nonrheumatic pulmonary valve insufficiency: Secondary | ICD-10-CM | POA: Diagnosis not present

## 2019-02-06 DIAGNOSIS — I700 Atherosclerosis of aorta: Secondary | ICD-10-CM | POA: Diagnosis present

## 2019-02-06 DIAGNOSIS — R2990 Unspecified symptoms and signs involving the nervous system: Secondary | ICD-10-CM | POA: Diagnosis not present

## 2019-02-06 DIAGNOSIS — R200 Anesthesia of skin: Secondary | ICD-10-CM | POA: Diagnosis not present

## 2019-02-06 DIAGNOSIS — Z951 Presence of aortocoronary bypass graft: Secondary | ICD-10-CM | POA: Diagnosis not present

## 2019-02-06 DIAGNOSIS — I712 Thoracic aortic aneurysm, without rupture: Secondary | ICD-10-CM | POA: Diagnosis present

## 2019-02-06 DIAGNOSIS — Z79899 Other long term (current) drug therapy: Secondary | ICD-10-CM | POA: Diagnosis not present

## 2019-02-06 DIAGNOSIS — E1165 Type 2 diabetes mellitus with hyperglycemia: Secondary | ICD-10-CM | POA: Diagnosis present

## 2019-02-06 DIAGNOSIS — I6381 Other cerebral infarction due to occlusion or stenosis of small artery: Secondary | ICD-10-CM

## 2019-02-06 DIAGNOSIS — K219 Gastro-esophageal reflux disease without esophagitis: Secondary | ICD-10-CM | POA: Diagnosis present

## 2019-02-06 DIAGNOSIS — N183 Chronic kidney disease, stage 3 unspecified: Secondary | ICD-10-CM | POA: Diagnosis present

## 2019-02-06 DIAGNOSIS — I2510 Atherosclerotic heart disease of native coronary artery without angina pectoris: Secondary | ICD-10-CM | POA: Diagnosis present

## 2019-02-06 DIAGNOSIS — E785 Hyperlipidemia, unspecified: Secondary | ICD-10-CM | POA: Diagnosis present

## 2019-02-06 DIAGNOSIS — E1169 Type 2 diabetes mellitus with other specified complication: Secondary | ICD-10-CM | POA: Diagnosis present

## 2019-02-06 DIAGNOSIS — Z794 Long term (current) use of insulin: Secondary | ICD-10-CM | POA: Diagnosis not present

## 2019-02-06 DIAGNOSIS — R29702 NIHSS score 2: Secondary | ICD-10-CM | POA: Diagnosis present

## 2019-02-06 DIAGNOSIS — I639 Cerebral infarction, unspecified: Secondary | ICD-10-CM | POA: Diagnosis not present

## 2019-02-06 DIAGNOSIS — I130 Hypertensive heart and chronic kidney disease with heart failure and stage 1 through stage 4 chronic kidney disease, or unspecified chronic kidney disease: Secondary | ICD-10-CM | POA: Diagnosis present

## 2019-02-06 DIAGNOSIS — Z888 Allergy status to other drugs, medicaments and biological substances status: Secondary | ICD-10-CM | POA: Diagnosis not present

## 2019-02-06 DIAGNOSIS — Z20828 Contact with and (suspected) exposure to other viral communicable diseases: Secondary | ICD-10-CM | POA: Diagnosis present

## 2019-02-06 DIAGNOSIS — I5042 Chronic combined systolic (congestive) and diastolic (congestive) heart failure: Secondary | ICD-10-CM | POA: Diagnosis present

## 2019-02-06 DIAGNOSIS — Z8249 Family history of ischemic heart disease and other diseases of the circulatory system: Secondary | ICD-10-CM | POA: Diagnosis not present

## 2019-02-06 DIAGNOSIS — G2581 Restless legs syndrome: Secondary | ICD-10-CM | POA: Diagnosis present

## 2019-02-06 DIAGNOSIS — E1122 Type 2 diabetes mellitus with diabetic chronic kidney disease: Secondary | ICD-10-CM | POA: Diagnosis present

## 2019-02-06 DIAGNOSIS — Z66 Do not resuscitate: Secondary | ICD-10-CM | POA: Diagnosis present

## 2019-02-06 DIAGNOSIS — Z7982 Long term (current) use of aspirin: Secondary | ICD-10-CM | POA: Diagnosis not present

## 2019-02-06 HISTORY — DX: Other cerebral infarction due to occlusion or stenosis of small artery: I63.81

## 2019-02-06 LAB — CBC WITH DIFFERENTIAL/PLATELET
Abs Immature Granulocytes: 0.04 10*3/uL (ref 0.00–0.07)
Basophils Absolute: 0.1 10*3/uL (ref 0.0–0.1)
Basophils Relative: 1 %
Eosinophils Absolute: 0.3 10*3/uL (ref 0.0–0.5)
Eosinophils Relative: 3 %
HCT: 45.5 % (ref 39.0–52.0)
Hemoglobin: 15.2 g/dL (ref 13.0–17.0)
Immature Granulocytes: 1 %
Lymphocytes Relative: 18 %
Lymphs Abs: 1.4 10*3/uL (ref 0.7–4.0)
MCH: 29 pg (ref 26.0–34.0)
MCHC: 33.4 g/dL (ref 30.0–36.0)
MCV: 86.8 fL (ref 80.0–100.0)
Monocytes Absolute: 0.8 10*3/uL (ref 0.1–1.0)
Monocytes Relative: 10 %
Neutro Abs: 5.2 10*3/uL (ref 1.7–7.7)
Neutrophils Relative %: 67 %
Platelets: 217 10*3/uL (ref 150–400)
RBC: 5.24 MIL/uL (ref 4.22–5.81)
RDW: 12.6 % (ref 11.5–15.5)
WBC: 7.8 10*3/uL (ref 4.0–10.5)
nRBC: 0 % (ref 0.0–0.2)

## 2019-02-06 LAB — COMPREHENSIVE METABOLIC PANEL
ALT: 14 U/L (ref 0–44)
AST: 16 U/L (ref 15–41)
Albumin: 3.3 g/dL — ABNORMAL LOW (ref 3.5–5.0)
Alkaline Phosphatase: 63 U/L (ref 38–126)
Anion gap: 5 (ref 5–15)
BUN: 16 mg/dL (ref 8–23)
CO2: 27 mmol/L (ref 22–32)
Calcium: 8.4 mg/dL — ABNORMAL LOW (ref 8.9–10.3)
Chloride: 106 mmol/L (ref 98–111)
Creatinine, Ser: 1.37 mg/dL — ABNORMAL HIGH (ref 0.61–1.24)
GFR calc Af Amer: 59 mL/min — ABNORMAL LOW (ref >60)
GFR calc non Af Amer: 51 mL/min — ABNORMAL LOW (ref >60)
Glucose, Bld: 208 mg/dL — ABNORMAL HIGH (ref 70–99)
Potassium: 3.9 mmol/L (ref 3.5–5.1)
Sodium: 138 mmol/L (ref 135–145)
Total Bilirubin: 0.6 mg/dL (ref 0.3–1.2)
Total Protein: 6.5 g/dL (ref 6.5–8.1)

## 2019-02-06 LAB — URINALYSIS, ROUTINE W REFLEX MICROSCOPIC
Bacteria, UA: NONE SEEN
Bilirubin Urine: NEGATIVE
Glucose, UA: >=500 mg/dL — AB
Ketones, ur: NEGATIVE mg/dL
Leukocytes,Ua: NEGATIVE
Nitrite: NEGATIVE
Protein, ur: 100 mg/dL — AB
Specific Gravity, Urine: 1.013 (ref 1.005–1.030)
pH: 6 (ref 5.0–8.0)

## 2019-02-06 LAB — TROPONIN I (HIGH SENSITIVITY)
Troponin I (High Sensitivity): 18 ng/L — ABNORMAL HIGH (ref <18)
Troponin I (High Sensitivity): 15 ng/L (ref <18)

## 2019-02-06 LAB — RAPID URINE DRUG SCREEN, HOSP PERFORMED
Amphetamines: NOT DETECTED
Barbiturates: NOT DETECTED
Benzodiazepines: NOT DETECTED
Cocaine: NOT DETECTED
Opiates: NOT DETECTED
Tetrahydrocannabinol: NOT DETECTED

## 2019-02-06 LAB — GLUCOSE, CAPILLARY
Glucose-Capillary: 160 mg/dL — ABNORMAL HIGH (ref 70–99)
Glucose-Capillary: 217 mg/dL — ABNORMAL HIGH (ref 70–99)
Glucose-Capillary: 170 mg/dL — ABNORMAL HIGH (ref 70–99)

## 2019-02-06 LAB — I-STAT CHEM 8, ED
BUN: 18 mg/dL (ref 8–23)
Calcium, Ion: 1.14 mmol/L — ABNORMAL LOW (ref 1.15–1.40)
Chloride: 101 mmol/L (ref 98–111)
Creatinine, Ser: 1.2 mg/dL (ref 0.61–1.24)
Glucose, Bld: 260 mg/dL — ABNORMAL HIGH (ref 70–99)
HCT: 45 % (ref 39.0–52.0)
Hemoglobin: 15.3 g/dL (ref 13.0–17.0)
Potassium: 3.9 mmol/L (ref 3.5–5.1)
Sodium: 139 mmol/L (ref 135–145)
TCO2: 26 mmol/L (ref 22–32)

## 2019-02-06 LAB — ECHOCARDIOGRAM COMPLETE
Height: 69 in
Weight: 3880.1 oz

## 2019-02-06 LAB — SARS CORONAVIRUS 2 (TAT 6-24 HRS): SARS Coronavirus 2: NEGATIVE

## 2019-02-06 LAB — APTT: aPTT: 27 seconds (ref 24–36)

## 2019-02-06 LAB — PROTIME-INR
INR: 1 (ref 0.8–1.2)
Prothrombin Time: 12.7 seconds (ref 11.4–15.2)

## 2019-02-06 LAB — HEMOGLOBIN A1C
Hgb A1c MFr Bld: 12.9 % — ABNORMAL HIGH (ref 4.8–5.6)
Mean Plasma Glucose: 323.53 mg/dL

## 2019-02-06 LAB — LIPID PANEL
Cholesterol: 209 mg/dL — ABNORMAL HIGH (ref 0–200)
HDL: 42 mg/dL (ref >40)
LDL Cholesterol: 140 mg/dL — ABNORMAL HIGH (ref 0–99)
Total CHOL/HDL Ratio: 5 RATIO
Triglycerides: 137 mg/dL (ref <150)
VLDL: 27 mg/dL (ref 0–40)

## 2019-02-06 LAB — BRAIN NATRIURETIC PEPTIDE: B Natriuretic Peptide: 238.2 pg/mL — ABNORMAL HIGH (ref 0.0–100.0)

## 2019-02-06 LAB — ETHANOL: Alcohol, Ethyl (B): <10 mg/dL (ref <10)

## 2019-02-06 MED ORDER — ISOSORBIDE MONONITRATE ER 30 MG PO TB24
60.0000 mg | ORAL_TABLET | Freq: Every day | ORAL | Status: DC
  Administered 2019-02-06 – 2019-02-07 (×2): 60 mg via ORAL
  Filled 2019-02-06 (×2): qty 2

## 2019-02-06 MED ORDER — SENNOSIDES-DOCUSATE SODIUM 8.6-50 MG PO TABS: 1.0000 | ORAL_TABLET | Freq: Every evening | ORAL | Status: DC | PRN

## 2019-02-06 MED ORDER — SODIUM CHLORIDE 0.9 % IV SOLN: INTRAVENOUS | Status: DC

## 2019-02-06 MED ORDER — ACETAMINOPHEN 325 MG PO TABS: 650.0000 mg | ORAL_TABLET | ORAL | Status: DC | PRN

## 2019-02-06 MED ORDER — STROKE: EARLY STAGES OF RECOVERY BOOK
Freq: Once | Status: AC
  Filled 2019-02-06: qty 1

## 2019-02-06 MED ORDER — ACETAMINOPHEN 650 MG RE SUPP: 650.0000 mg | RECTAL | Status: DC | PRN

## 2019-02-06 MED ORDER — INSULIN ASPART 100 UNIT/ML ~~LOC~~ SOLN: 0.0000 [IU] | Freq: Every day | SUBCUTANEOUS | Status: DC

## 2019-02-06 MED ORDER — ATORVASTATIN CALCIUM 80 MG PO TABS
80.0000 mg | ORAL_TABLET | Freq: Every day | ORAL | Status: DC
  Administered 2019-02-06 – 2019-02-07 (×2): 80 mg via ORAL
  Filled 2019-02-06 (×2): qty 1

## 2019-02-06 MED ORDER — POLYETHYLENE GLYCOL 3350 17 G PO PACK
17.0000 g | PACK | Freq: Every day | ORAL | Status: DC
  Administered 2019-02-07: 17 g via ORAL
  Filled 2019-02-06 (×2): qty 1

## 2019-02-06 MED ORDER — TICAGRELOR 90 MG PO TABS
90.0000 mg | ORAL_TABLET | Freq: Two times a day (BID) | ORAL | Status: DC
  Administered 2019-02-06 – 2019-02-07 (×3): 90 mg via ORAL
  Filled 2019-02-06 (×3): qty 1

## 2019-02-06 MED ORDER — INSULIN ASPART 100 UNIT/ML ~~LOC~~ SOLN
0.0000 [IU] | Freq: Three times a day (TID) | SUBCUTANEOUS | Status: DC
  Administered 2019-02-06: 4 [IU] via SUBCUTANEOUS
  Administered 2019-02-06 – 2019-02-07 (×2): 7 [IU] via SUBCUTANEOUS
  Administered 2019-02-07: 4 [IU] via SUBCUTANEOUS

## 2019-02-06 MED ORDER — POLYSACCHARIDE IRON COMPLEX 150 MG PO CAPS
150.0000 mg | ORAL_CAPSULE | Freq: Every day | ORAL | Status: DC
  Administered 2019-02-06 – 2019-02-07 (×2): 150 mg via ORAL
  Filled 2019-02-06 (×2): qty 1

## 2019-02-06 MED ORDER — PRAMIPEXOLE DIHYDROCHLORIDE 1.5 MG PO TABS
1.5000 mg | ORAL_TABLET | Freq: Every evening | ORAL | Status: DC
  Administered 2019-02-06: 1.5 mg via ORAL
  Filled 2019-02-06 (×2): qty 1

## 2019-02-06 MED ORDER — IOHEXOL 350 MG/ML SOLN
75.0000 mL | Freq: Once | INTRAVENOUS | Status: AC | PRN
  Administered 2019-02-06: 75 mL via INTRAVENOUS

## 2019-02-06 MED ORDER — EZETIMIBE 10 MG PO TABS
10.0000 mg | ORAL_TABLET | Freq: Every day | ORAL | Status: DC
  Administered 2019-02-06 – 2019-02-07 (×2): 10 mg via ORAL
  Filled 2019-02-06 (×2): qty 1

## 2019-02-06 MED ORDER — ENOXAPARIN SODIUM 40 MG/0.4ML ~~LOC~~ SOLN
40.0000 mg | SUBCUTANEOUS | Status: DC
  Administered 2019-02-06 – 2019-02-07 (×2): 40 mg via SUBCUTANEOUS
  Filled 2019-02-06 (×2): qty 0.4

## 2019-02-06 MED ORDER — INSULIN ASPART 100 UNIT/ML ~~LOC~~ SOLN
4.0000 [IU] | Freq: Three times a day (TID) | SUBCUTANEOUS | Status: DC
  Administered 2019-02-06 – 2019-02-07 (×4): 4 [IU] via SUBCUTANEOUS

## 2019-02-06 MED ORDER — ACETAMINOPHEN 160 MG/5ML PO SOLN: 650.0000 mg | ORAL | Status: DC | PRN

## 2019-02-06 MED ORDER — INSULIN GLARGINE (1 UNIT DIAL) 300 UNIT/ML ~~LOC~~ SOPN: 1.0000 | PEN_INJECTOR | Freq: Every day | SUBCUTANEOUS | Status: DC

## 2019-02-06 MED ORDER — ASPIRIN EC 81 MG PO TBEC
81.0000 mg | DELAYED_RELEASE_TABLET | Freq: Every day | ORAL | Status: DC
  Administered 2019-02-06: 81 mg via ORAL
  Filled 2019-02-06: qty 1

## 2019-02-06 NOTE — ED Notes (Signed)
Patient transported to CT 

## 2019-02-06 NOTE — Progress Notes (Signed)
  Echocardiogram 2D Echocardiogram has been performed.  Johny Chess 02/06/2019, 4:33 PM

## 2019-02-06 NOTE — ED Notes (Addendum)
ED TO INPATIENT HANDOFF REPORT  ED Nurse Name and Phone #: Alycia Rossetti [phone]  S Name/Age/Gender James [name] 73 y.o. male Room/Bed: 037C/037C  Code Status   Code Status: DNR  Home/SNF/Other Home Patient oriented to: self, place, time and situation Is this baseline? Yes   Triage Complete: Triage complete  Chief Complaint Left sided numbness [R20.0] Acute lacunar infarction [facility]) [I63.81]  Triage Note Pt arrived via [facility] c/o L side numbess and moves from hand to entire arm which started at approx 3pm today. Pt also c/o diarerah for today. No weakness. Per pt he has not been taking his prescribed lasix. ABD is stiff at this time without pain or tenderness    Allergies Allergies  Allergen Reactions  . Lisinopril Cough    Level of Care/Admitting Diagnosis ED Disposition    ED Disposition Condition Comment   Admit  Hospital Area: [facility] [[id number]]  Level of Care: Telemetry Medical [[id number]]  Covid Evaluation: Asymptomatic Screening Protocol (No Symptoms)  Diagnosis: Acute lacunar infarction [facility]) [[id number]]  Admitting Physician: Jonah Blue [[id number]]  Attending Physician: Jonah Blue [[id number]]  Estimated length of stay: past midnight tomorrow  Certification:: I certify this patient will need inpatient services for at least 2 midnights       B Medical/Surgery History Past Medical History:  Diagnosis Date  . Abnormal EKG 03/09/2013  . Abnormal nuclear cardiac imaging test 11/14/2013  . Acute lacunar infarction (HCC) 02/06/2019  . Aortic atherosclerosis (HCC) 07/07/2017  . Arthritis    "all over" (09/30/2017)  . Ascending aortic aneurysm (HCC)    cMRI (2/15): Normal LV EF 54%, Mild BAE, Trileaflet Aortic Valve, Upper limits of normal ascending aorta 3.8 cm, Normal aortic arch 2.3 cm with bovine origin of left carotid  . Atrial flutter (HCC)    Post-op with no reoccurence  . Chronic diastolic heart failure (HCC)    Echo (1/15): Left ventricle: The  cavity size was mildly dilated. Mild LVH. EF 55% to 60%. Wall motion was normal; Gr 1 diastolic dysfunction Left atrium: The atrium was mildly dilated. Right atrium: The atrium was mildly to moderately dilated. Aortic arch measures 4.8 cm (moderately dilated); suggest CTA or MRA to better assess.  . Chronic systolic CHF (congestive heart failure) (HCC) 07/24/2017  . CKD (chronic kidney disease), stage III 07/24/2017  . Coronary artery disease 10/2011   a. s/p cabg;  b. Myoview (9/15):  ant ischemia, poss TID, EF 43%, high risk >> LHC (9/15): Dist LM 50, pLAD 90 then 100, pCFX 50, mCFX 75, oRCA 75, dRCA 95, L-LAD patent, S-Dx 100, S-OM1 patent, S-PDA patent, EF 55% >> native Dx small caliber and not well suited for PCI; native RCA amenable to PCI and would restore flow to PLA branches but would jeopardize SVG-RCA - Med Rx  . Deviated nasal septum   . Dilated aortic root (HCC)   . Diverticulitis of sigmoid colon 07/24/2017  . DM (diabetes mellitus), type 2, uncontrolled with complications (HCC)    onset 2013  . Dyslipidemia   . Edema 02/09/2013  . GERD (gastroesophageal reflux disease)   . Hx of Doppler ultrasound    Carotid US (9/13): No ICA stenosis  . Hypertension   . Pleural thickening 07/24/2017   Past Surgical History:  Procedure Laterality Date  . CORONARY ARTERY BYPASS GRAFT  10/27/2011   Procedure: CORONARY ARTERY BYPASS GRAFTING (CABG);  Surgeon: Kerin Perna, MD;  Location: [facility] OR;  Service: Open Heart Surgery;  Laterality: N/A;  Coronary Artery Bypass Grafting times four using left internal mammary artery and right greater saphenous vein endoscopically harvested  . CORONARY STENT INTERVENTION N/A 09/30/2017   Procedure: CORONARY STENT INTERVENTION;  Surgeon: Corky Crafts, MD;  Location: [facility] INVASIVE CV LAB;  Service: Cardiovascular;  Laterality: N/A;  . KNEE ARTHROSCOPY Left 2003  . KNEE ARTHROSCOPY Right 2006  . LEFT HEART CATHETERIZATION WITH CORONARY ANGIOGRAM N/A 10/23/2011    Procedure: LEFT HEART CATHETERIZATION WITH CORONARY ANGIOGRAM;  Surgeon: Quintella Reichert, MD;  Location: [facility] CATH LAB;  Service: Cardiovascular;  Laterality: N/A;  . LEFT HEART CATHETERIZATION WITH CORONARY ANGIOGRAM N/A 11/16/2013   Procedure: LEFT HEART CATHETERIZATION WITH CORONARY ANGIOGRAM;  Surgeon: Micheline Chapman, MD;  Location: [facility] CATH LAB;  Service: Cardiovascular;  Laterality: N/A;  . RIGHT/LEFT HEART CATH AND CORONARY/GRAFT ANGIOGRAPHY N/A 09/30/2017   Procedure: RIGHT/LEFT HEART CATH AND CORONARY/GRAFT ANGIOGRAPHY;  Surgeon: Corky Crafts, MD;  Location: [facility] INVASIVE CV LAB;  Service: Cardiovascular;  Laterality: N/A;     A IV Location/Drains/Wounds Patient Lines/Drains/Airways Status   Active Line/Drains/Airways    Name:   Placement date:   Placement time:   Site:   Days:   Peripheral IV 02/05/19 Right Antecubital   02/05/19    2345    Antecubital   1          Intake/Output Last 24 hours No intake or output data in the 24 hours ending 02/06/19 1037  Labs/Imaging Results for orders placed or performed during the hospital encounter of 02/05/19 (from the past 48 hour(s))  CBC w/ Diff     Status: None   Collection Time: 02/05/19 11:10 PM  Result Value Ref Range   WBC 7.8 4.0 - 10.5 K/uL   RBC 5.24 4.22 - 5.81 MIL/uL   Hemoglobin 15.2 13.0 - 17.0 g/dL   HCT 19.4 17.4 - 08.1 %   MCV 86.8 80.0 - 100.0 fL   MCH 29.0 26.0 - 34.0 pg   MCHC 33.4 30.0 - 36.0 g/dL   RDW 44.8 18.5 - 63.1 %   Platelets 217 150 - 400 K/uL   nRBC 0.0 0.0 - 0.2 %   Neutrophils Relative % 67 %   Neutro Abs 5.2 1.7 - 7.7 K/uL   Lymphocytes Relative 18 %   Lymphs Abs 1.4 0.7 - 4.0 K/uL   Monocytes Relative 10 %   Monocytes Absolute 0.8 0.1 - 1.0 K/uL   Eosinophils Relative 3 %   Eosinophils Absolute 0.3 0.0 - 0.5 K/uL   Basophils Relative 1 %   Basophils Absolute 0.1 0.0 - 0.1 K/uL   Immature Granulocytes 1 %   Abs Immature Granulocytes 0.04 0.00 - 0.07 K/uL    Comment: Performed at  [facility], [address], [place], [place] [address]  BNP     Status: Abnormal   Collection Time: 02/05/19 11:10 PM  Result Value Ref Range   B Natriuretic Peptide 238.2 (H) 0.0 - 100.0 pg/mL    Comment: Performed at [facility], [address], [place], [place] [address]  CMP     Status: Abnormal   Collection Time: 02/05/19 11:10 PM  Result Value Ref Range   Sodium 138 135 - 145 mmol/L   Potassium 3.9 3.5 - 5.1 mmol/L   Chloride 106 98 - 111 mmol/L   CO2 27 22 - 32 mmol/L   Glucose, Bld 208 (H) 70 - 99 mg/dL   BUN 16 8 - 23 mg/dL  Creatinine, Ser 1.37 (H) 0.61 - 1.24 mg/dL   Calcium 8.4 (L) 8.9 - 10.3 mg/dL   Total Protein 6.5 6.5 - 8.1 g/dL   Albumin 3.3 (L) 3.5 - 5.0 g/dL   AST 16 15 - 41 U/L   ALT 14 0 - 44 U/L   Alkaline Phosphatase 63 38 - 126 U/L   Total Bilirubin 0.6 0.3 - 1.2 mg/dL   GFR calc non Af Amer 51 (L) >60 mL/min   GFR calc Af Amer 59 (L) >60 mL/min   Anion gap 5 5 - 15    Comment: Performed at [facility], [address] [place], [place] [address]  Troponin I (High Sensitivity)     Status: Abnormal   Collection Time: 02/05/19 11:10 PM  Result Value Ref Range   Troponin I (High Sensitivity) 18 (H) <18 ng/L    Comment: (NOTE) Elevated high sensitivity troponin I (hsTnI) values and significant  changes across serial measurements may suggest ACS but many other  chronic and acute conditions are known to elevate hsTnI results.  Refer to the "Links" section for chest pain algorithms and additional  guidance. Performed at [facility], [address] [place], [place] [address]   Protime-INR     Status: None   Collection Time: 02/05/19 11:10 PM  Result Value Ref Range   Prothrombin Time 12.7 11.4 - 15.2 seconds   INR 1.0 0.8 - 1.2    Comment: (NOTE) INR goal varies based on device and disease states. Performed at [facility], [address] [place], [place] [address]   APTT     Status: None   Collection Time:  02/05/19 11:10 PM  Result Value Ref Range   aPTT 27 24 - 36 seconds    Comment: Performed at [facility], [address] [place], [place] [address]  Urine rapid drug screen (hosp performed)     Status: None   Collection Time: 02/06/19 12:07 AM  Result Value Ref Range   Opiates NONE DETECTED NONE DETECTED   Cocaine NONE DETECTED NONE DETECTED   Benzodiazepines NONE DETECTED NONE DETECTED   Amphetamines NONE DETECTED NONE DETECTED   Tetrahydrocannabinol NONE DETECTED NONE DETECTED   Barbiturates NONE DETECTED NONE DETECTED    Comment: (NOTE) DRUG SCREEN FOR MEDICAL PURPOSES ONLY.  IF CONFIRMATION IS NEEDED FOR ANY PURPOSE, NOTIFY LAB WITHIN 5 DAYS. LOWEST DETECTABLE LIMITS FOR URINE DRUG SCREEN Drug Class                     Cutoff (ng/mL) Amphetamine and metabolites    1000 Barbiturate and metabolites    200 Benzodiazepine                 200 Tricyclics and metabolites     300 Opiates and metabolites        300 Cocaine and metabolites        300 THC                            50 Performed at [facility], [address] [place], [place] [address]   Urinalysis, Routine w reflex microscopic     Status: Abnormal   Collection Time: 02/06/19 12:07 AM  Result Value Ref Range   Color, Urine STRAW (A) YELLOW   APPearance CLEAR CLEAR   Specific Gravity, Urine 1.013 1.005 - 1.030   pH 6.0 5.0 -  8.0   Glucose, UA >=500 (A) NEGATIVE mg/dL   Hgb urine dipstick SMALL (A) NEGATIVE   Bilirubin Urine NEGATIVE NEGATIVE   Ketones, ur NEGATIVE NEGATIVE mg/dL   Protein, ur 469 (A) NEGATIVE mg/dL   Nitrite NEGATIVE NEGATIVE   Leukocytes,Ua NEGATIVE NEGATIVE   RBC / HPF 0-5 0 - 5 RBC/hpf   WBC, UA 0-5 0 - 5 WBC/hpf   Bacteria, UA NONE SEEN NONE SEEN   Squamous Epithelial / LPF 0-5 0 - 5    Comment: Performed at [facility], [address], [place], [place] [address]  I-stat chem 8, ED     Status: Abnormal   Collection Time: 02/06/19 12:43 AM  Result Value Ref Range    Sodium 139 135 - 145 mmol/L   Potassium 3.9 3.5 - 5.1 mmol/L   Chloride 101 98 - 111 mmol/L   BUN 18 8 - 23 mg/dL   Creatinine, Ser 8.41 0.61 - 1.24 mg/dL   Glucose, Bld 324 (H) 70 - 99 mg/dL   Calcium, Ion 4.01 (L) 1.15 - 1.40 mmol/L   TCO2 26 22 - 32 mmol/L   Hemoglobin 15.3 13.0 - 17.0 g/dL   HCT 02.7 25.3 - 66.4 %  Ethanol     Status: None   Collection Time: 02/06/19  2:45 AM  Result Value Ref Range   Alcohol, Ethyl (B) <10 <10 mg/dL    Comment: (NOTE) Lowest detectable limit for serum alcohol is 10 mg/dL. For medical purposes only. Performed at [facility], [address] [place], [place] [address]   Troponin I (High Sensitivity)     Status: None   Collection Time: 02/06/19  9:22 AM  Result Value Ref Range   Troponin I (High Sensitivity) 15 <18 ng/L    Comment: (NOTE) Elevated high sensitivity troponin I (hsTnI) values and significant  changes across serial measurements may suggest ACS but many other  chronic and acute conditions are known to elevate hsTnI results.  Refer to the "Links" section for chest pain algorithms and additional  guidance. Performed at [facility], [address], [place], [place] [address]   Hemoglobin A1c     Status: Abnormal   Collection Time: 02/06/19  9:22 AM  Result Value Ref Range   Hgb A1c MFr Bld 12.9 (H) 4.8 - 5.6 %    Comment: (NOTE) Pre diabetes:          5.7%-6.4% Diabetes:              >6.4% Glycemic control for   <7.0% adults with diabetes    Mean Plasma Glucose 323.53 mg/dL    Comment: Performed at [facility], [address], [place], [place] [address]  Lipid panel     Status: Abnormal   Collection Time: 02/06/19  9:22 AM  Result Value Ref Range   Cholesterol 209 (H) 0 - 200 mg/dL   Triglycerides 643 <329 mg/dL   HDL 42 >51 mg/dL   Total CHOL/HDL Ratio 5.0 RATIO   VLDL 27 0 - 40 mg/dL   LDL Cholesterol 884 (H) 0 - 99 mg/dL    Comment:        Total Cholesterol/HDL:CHD Risk Coronary Heart  Disease Risk Table                     Men   Women  1/2 Average Risk   3.4   3.3  Average Risk  5.0   4.4  2 X Average Risk   9.6   7.1  3 X Average Risk  23.4   11.0        Use the calculated Patient Ratio above and the CHD Risk Table to determine the patient's CHD Risk.        ATP III CLASSIFICATION (LDL):  <100     mg/dL   Optimal  100-129  mg/dL   Near or Above                    Optimal  130-159  mg/dL   Borderline  160-189  mg/dL   High  >190     mg/dL   Very High Performed at [facility] [address] [place], [place] [address]    CT HEAD WO CONTRAST  Result Date: 02/06/2019 CLINICAL DATA:  Focal neuro deficit, > 6 hrs, stroke suspected. Left-sided numbness. EXAM: CT HEAD WITHOUT CONTRAST TECHNIQUE: Contiguous axial images were obtained from the base of the skull through the vertex without intravenous contrast. COMPARISON:  None. FINDINGS: Brain: No intracranial hemorrhage, mass effect, or midline shift. Brain volume is normal for age. No hydrocephalus. The basilar cisterns are patent. Small remote lacunar infarct in the left basal ganglia. Minor periventricular white matter hypodensities, nonspecific but typically chronic small vessel ischemia. Possible chronic ischemic change in the right pons. No evidence of territorial infarct or acute ischemia. No extra-axial or intracranial fluid collection. Vascular: Atherosclerosis of skullbase vasculature without hyperdense vessel or abnormal calcification. Skull: No fracture or focal lesion. Sinuses/Orbits: No acute findings. Bilateral cataract resection. Other: None. IMPRESSION: 1. No acute intracranial abnormality. 2. Small remote lacunar infarct in the left basal ganglia. Mild periventricular white matter hypodensity, nonspecific but typically chronic small vessel ischemia. Electronically Signed   By: Keith Rake M.D.   On: 02/06/2019 00:47   MR BRAIN WO CONTRAST  Result Date: 02/06/2019 CLINICAL DATA:  Left-sided  numbness that started yesterday EXAM: MRI HEAD WITHOUT CONTRAST TECHNIQUE: Multiplanar, multiecho pulse sequences of the brain and surrounding structures were obtained without intravenous contrast. COMPARISON:  Head CT from earlier today FINDINGS: Brain: Subcentimeter restricted diffusion in the right thalamus. Subcentimeter weakly restricted diffusion in the right centrum semiovale. There is a punctate diffusion hyperintense focus in the right pons on coronal images that is not definite based on the other sequences. There is mild to moderate FLAIR hyperintensity in the cerebral white matter with areas of chronic lacune is in the bilateral centrum semiovale and left frontal white matter. Few remote microhemorrhages with nonspecific pattern given limited extent. No acute hemorrhage, hydrocephalus, or mass. Vascular: Normal flow voids Skull and upper cervical spine: Normal marrow signal Sinuses/Orbits: Bilateral cataract resection. IMPRESSION: 1. Acute lacunar infarct in the right thalamus. 2. Subacute lacunar infarct in the right centrum semiovale. 3. Chronic small vessel ischemia with remote lacunar infarcts. Electronically Signed   By: Monte Fantasia M.D.   On: 02/06/2019 07:49   DG Chest Port 1 View  Result Date: 02/06/2019 CLINICAL DATA:  Left-sided numbness. EXAM: PORTABLE CHEST 1 VIEW COMPARISON:  December 20, 2018 FINDINGS: The heart size remains enlarged. There is persistent consolidation at the left lung base. There is some elevation of the left hemidiaphragm. There are probable small bilateral pleural effusions. The patient is status post prior median sternotomy. There is no pneumothorax. There is vascular congestion. There is no acute osseous abnormality. IMPRESSION: 1. Persistent left basilar consolidation and probable small bilateral pleural effusions. 2.  Cardiomegaly with vascular congestion. 3. Low lung volumes. Electronically Signed   By: Katherine Mantle M.D.   On: 02/06/2019 00:30     Pending Labs Unresulted Labs (From admission, onward)    Start     Ordered   02/06/19 0533  SARS CORONAVIRUS 2 (TAT 6-24 HRS) Nasopharyngeal Nasopharyngeal Swab  (Tier 3 (TAT 6-24 hrs))  Once,   STAT    Question Answer Comment  Is this test for diagnosis or screening Screening   Symptomatic for COVID-19 as defined by CDC No   Hospitalized for COVID-19 No   Admitted to ICU for COVID-19 No   Previously tested for COVID-19 Yes   Resident in a congregate (group) care setting No   Employed in healthcare setting No      02/06/19 0533          Vitals/Pain Today's Vitals   02/06/19 0545 02/06/19 0600 02/06/19 0739 02/06/19 0900  BP: (!) 152/80 (!) 149/84 (!) 166/94 (!) 177/91  Pulse: 71 68 69 69  Resp: (!) 23 18 (!) 22 16  Temp:   98.3 F (36.8 C)   TempSrc:   Oral   SpO2: 100% 96% 97% 97%  PainSc:        Isolation Precautions No active isolations  Medications Medications  aspirin tablet 81 mg (has no administration in time range)  atorvastatin (LIPITOR) tablet 80 mg (has no administration in time range)  ezetimibe (ZETIA) tablet 10 mg (has no administration in time range)  isosorbide mononitrate (IMDUR) 24 hr tablet 60 mg (has no administration in time range)  Insulin Glargine (1 Unit Dial) SOPN 1 Dose (has no administration in time range)  polyethylene glycol (MIRALAX / GLYCOLAX) packet 17 g (has no administration in time range)  iron polysaccharides (NIFEREX) capsule 150 mg (has no administration in time range)  ticagrelor (BRILINTA) tablet 90 mg (has no administration in time range)  pramipexole (MIRAPEX) tablet 1.5 mg (has no administration in time range)  enoxaparin (LOVENOX) injection 40 mg (has no administration in time range)   stroke: mapping our early stages of recovery book (has no administration in time range)  0.9 %  sodium chloride infusion (has no administration in time range)  acetaminophen (TYLENOL) tablet 650 mg (has no administration in time range)     Or  acetaminophen (TYLENOL) 160 MG/5ML solution 650 mg (has no administration in time range)    Or  acetaminophen (TYLENOL) suppository 650 mg (has no administration in time range)  senna-docusate (Senokot-S) tablet 1 tablet (has no administration in time range)  insulin aspart (novoLOG) injection 0-20 Units (has no administration in time range)  insulin aspart (novoLOG) injection 0-5 Units (has no administration in time range)  insulin aspart (novoLOG) injection 4 Units (has no administration in time range)  pramipexole (MIRAPEX) tablet 1.5 mg (1.5 mg Oral Given 02/06/19 0030)  iohexol (OMNIPAQUE) 350 MG/ML injection 75 mL (75 mLs Intravenous Contrast Given 02/06/19 1016)    Mobility walks Low fall risk   Focused Assessments    R Recommendations: See Admitting Provider Note  Report given to: [facility] RN  Additional Notes:

## 2019-02-06 NOTE — Progress Notes (Signed)
  Speech Language Pathology Treatment:    Patient Details Name: JUANDEDIOS DUDASH MRN: [id number] DOB: 02-Oct-1945 Today's Date: 02/06/2019 Time:  -     Assessment / Plan / Recommendation Clinical Impression: Pt had TIA. All symptoms has resolved. Screen revealed pt communicates at conversational level. Feels his abilities are at baseline. No skilled ST needed at this time.                 Wynelle Bourgeois., MA, CCC-SLP 02/06/2019, 2:05 PM

## 2019-02-06 NOTE — ED Notes (Signed)
Patient transported to MRI 

## 2019-02-06 NOTE — Evaluation (Signed)
Physical Therapy Evaluation Patient Details Name: James [name] MRN: [id number] DOB: Jun 26, 1945 Today's Date: 02/06/2019   History of Present Illness  James [name] is a 73 y.o. male with medical history significant of HTN; HLD; DM; CAD s/p CABG; stage 2 CKD; chronic combined CHF; RLS; and AAA presenting with left arm and hand numbness. MRI scan shows right thalamic lacunar infarct as well as tiny right basal ganglia as well as a questionable right pontine lacunar infarct as well.  Clinical Impression  Pt admitted with above. On PT evaluation, pt denies residual deficits. Ambulating 400 feet with no assistive device at a min guard assist level. Does present as high fall risk based on decreased gait speed, history of recent falls, and balance impairments. Recommend OPPT for further balance training and cane for all mobility; pt in agreement.    Follow Up Recommendations Outpatient PT    Equipment Recommendations  None recommended by PT    Recommendations for Other Services       Precautions / Restrictions Precautions Precautions: Fall Restrictions Weight Bearing Restrictions: No      Mobility  Bed Mobility               General bed mobility comments: OOB in chair  Transfers Overall transfer level: Needs assistance Equipment used: None Transfers: Sit to/from Stand Sit to Stand: Min guard            Ambulation/Gait Ambulation/Gait assistance: Min guard Gait Distance (Feet): 400 Feet Assistive device: None Gait Pattern/deviations: Step-through pattern;Decreased stride length;Decreased dorsiflexion - right;Decreased dorsiflexion - left;Shuffle Gait velocity: decreased   General Gait Details: Min guard for stability, tendency for downward gaze, shuffling gait pattern  Stairs            Wheelchair Mobility    Modified Rankin (Stroke Patients Only) Modified Rankin (Stroke Patients Only) Pre-Morbid Rankin Score: No significant disability Modified Rankin:  Moderately severe disability     Balance Overall balance assessment: Mild deficits observed, not formally tested                                           Pertinent Vitals/Pain Pain Assessment: No/denies pain    Home Living Family/patient expects to be discharged to:: Private residence Living Arrangements: Alone Available Help at Discharge: Family;Available PRN/intermittently Type of Home: House Home Access: Stairs to enter   Entrance Stairs-Number of Steps: 4 Home Layout: One level Home Equipment: Cane - single point      Prior Function Level of Independence: Independent with assistive device(s)         Comments: Uses cane intermittently, history of 2 falls in past month     Hand Dominance        Extremity/Trunk Assessment   Upper Extremity Assessment Upper Extremity Assessment: RUE deficits/detail;LUE deficits/detail RUE Deficits / Details: AROM WFL RUE Sensation: WNL LUE Deficits / Details: Shoulder flexion AROM to ~90 degrees, pt stating, "I think my rotator cuff is torn." LUE Sensation: WNL    Lower Extremity Assessment Lower Extremity Assessment: RLE deficits/detail;LLE deficits/detail RLE Deficits / Details: Strength 5/5 LLE Deficits / Details: Strength 5/5       Communication   Communication: No difficulties  Cognition Arousal/Alertness: Awake/alert Behavior During Therapy: WFL for tasks assessed/performed Overall Cognitive Status: Within Functional Limits for tasks assessed  General Comments      Exercises     Assessment/Plan    PT Assessment Patient needs continued PT services  PT Problem List Decreased strength;Decreased activity tolerance;Decreased balance;Decreased mobility       PT Treatment Interventions Gait training;Stair training;Functional mobility training;Therapeutic activities;Therapeutic exercise;Balance training;Patient/family education    PT  Goals (Current goals can be found in the Care Plan section)  Acute Rehab PT Goals Patient Stated Goal: "play with my grandson." PT Goal Formulation: With patient Time For Goal Achievement: 02/20/19 Potential to Achieve Goals: Good    Frequency Min 3X/week   Barriers to discharge        Co-evaluation               AM-PAC PT "6 Clicks" Mobility  Outcome Measure Help needed turning from your back to your side while in a flat bed without using bedrails?: None Help needed moving from lying on your back to sitting on the side of a flat bed without using bedrails?: None Help needed moving to and from a bed to a chair (including a wheelchair)?: A Little Help needed standing up from a chair using your arms (e.g., wheelchair or bedside chair)?: A Little Help needed to walk in hospital room?: A Little Help needed climbing 3-5 steps with a railing? : A Little 6 Click Score: 20    End of Session Equipment Utilized During Treatment: Gait belt Activity Tolerance: Patient tolerated treatment well Patient left: in bed;with call bell/phone within reach Nurse Communication: Mobility status PT Visit Diagnosis: Unsteadiness on feet (R26.81);History of falling (Z91.81)    Time: 1452-1510 PT Time Calculation (min) (ACUTE ONLY): 18 min   Charges:   PT Evaluation $PT Eval Moderate Complexity: 1 Mod          Ellamae Sia, [place], DPT Acute Rehabilitation Services Pager [phone] Office [phone]   Willy Eddy 02/06/2019, 3:40 PM

## 2019-02-06 NOTE — Consult Note (Signed)
Neurology Consultation  Reason for Consult: Left arm numbness Referring Physician: Dr. Eudelia Bunchardama  CC: Left arm numbness  History is obtained from: Patient  HPI: James [name] is a 73 y.o. male past medical history of ascending aortic aneurysm, atrial flutter which was postop with no recurrence, chronic diastolic heart failure, chronic systolic heart failure, CKD, coronary artery disease, diabetes, dyslipidemia, hypertension, presenting to the emergency room for sudden onset of left arm numbness. He said he was at home working with a dog, when he noticed that his left arm and leg felt as if they are going to sleep.  The leg symptoms lasted about 3 hours but the arm still feels numb. This brought him to the emergency room.  Noncontrast head CT was unremarkable. On symptoms are persistent, for which a neurological consultation was placed for possible stroke/TIA. Denies any similar symptoms in the past. Reports some ongoing mild shortness of breath.  LKW: 3 PM on 02/05/2019 tpa given?: no, outside the window Premorbid modified Rankin scale (mRS):0 ROS: ROS was performed and is negative except as noted in the HPI.   Past Medical History:  Diagnosis Date  . Abnormal EKG 03/09/2013  . Abnormal nuclear cardiac imaging test 11/14/2013  . Aortic atherosclerosis (HCC) 07/07/2017  . Arthritis    "all over" (09/30/2017)  . Ascending aortic aneurysm (HCC)    cMRI (2/15): Normal LV EF 54%, Mild BAE, Trileaflet Aortic Valve, Upper limits of normal ascending aorta 3.8 cm, Normal aortic arch 2.3 cm with bovine origin of left carotid  . Atrial flutter (HCC)    Post-op with no reoccurence  . Chronic diastolic heart failure (HCC)    Echo (1/15): Left ventricle: The cavity size was mildly dilated. Mild LVH. EF 55% to 60%. Wall motion was normal; Gr 1 diastolic dysfunction Left atrium: The atrium was mildly dilated. Right atrium: The atrium was mildly to moderately dilated. Aortic arch measures 4.8 cm  (moderately dilated); suggest CTA or MRA to better assess.  . Chronic systolic CHF (congestive heart failure) (HCC) 07/24/2017  . CKD (chronic kidney disease), stage III 07/24/2017  . Coronary artery disease 10/2011   a. s/p cabg;  b. Myoview (9/15):  ant ischemia, poss TID, EF 43%, high risk >> LHC (9/15): Dist LM 50, pLAD 90 then 100, pCFX 50, mCFX 75, oRCA 75, dRCA 95, L-LAD patent, S-Dx 100, S-OM1 patent, S-PDA patent, EF 55% >> native Dx small caliber and not well suited for PCI; native RCA amenable to PCI and would restore flow to PLA branches but would jeopardize SVG-RCA - Med Rx  . Deviated nasal septum   . Dilated aortic root (HCC)   . Diverticulitis of sigmoid colon 07/24/2017  . DM (diabetes mellitus), type 2, uncontrolled with complications (HCC)    onset 2013  . Dyslipidemia   . Edema 02/09/2013  . GERD (gastroesophageal reflux disease)   . Hx of Doppler ultrasound    Carotid US (9/13): No ICA stenosis  . Hyperlipidemia 10/24/2011  . Hypertension   . Pleural thickening 07/24/2017    Family History  Problem Relation Age of Onset  . Hypertension Mother   . Stroke Father   . Hypertension Father   . Hypertension Paternal Grandfather   . Heart attack Neg Hx    Social History:   reports that he has never smoked. He has never used smokeless tobacco. He reports current alcohol use. He reports that he does not use drugs.  Medications No current facility-administered medications for this encounter.  Current Outpatient Medications:  .  acetaminophen (TYLENOL) 500 MG tablet, Take 1,000 mg by mouth every 6 (six) hours as needed for headache., Disp: , Rfl:  .  aspirin 81 MG tablet, Take 81 mg by mouth at bedtime. , Disp: , Rfl:  .  atorvastatin (LIPITOR) 80 MG tablet, Take 1 tablet (80 mg total) by mouth daily., Disp: 90 tablet, Rfl: 3 .  cholecalciferol (VITAMIN D) 1000 units tablet, Take 1,000 Units by mouth daily., Disp: , Rfl:  .  ezetimibe (ZETIA) 10 MG tablet, Take 1 tablet (10  mg total) by mouth daily. Please make overdue appt with Dr. Radford Pax before anymore refills. 1st attempt, Disp: 30 tablet, Rfl: 0 .  glimepiride (AMARYL) 4 MG tablet, Take 4 mg by mouth daily with breakfast., Disp: , Rfl:  .  insulin lispro (HUMALOG) 100 UNIT/ML injection, Inject 10 Units into the skin 2 (two) times daily. , Disp: , Rfl:  .  iron polysaccharides (NIFEREX) 150 MG capsule, Take 1 capsule (150 mg total) by mouth daily., Disp: 30 capsule, Rfl: 0 .  isosorbide mononitrate (IMDUR) 60 MG 24 hr tablet, Take 60 mg by mouth daily., Disp: , Rfl:  .  metFORMIN (GLUCOPHAGE) 1000 MG tablet, Take 1 tablet (1,000 mg total) by mouth 2 (two) times daily with a meal., Disp: 60 tablet, Rfl: 0 .  metoprolol succinate (TOPROL-XL) 100 MG 24 hr tablet, Take 125 mg by mouth daily. , Disp: , Rfl: 10 .  Multiple Vitamin (MULTIVITAMIN WITH MINERALS) TABS tablet, Take 1 tablet by mouth daily., Disp: , Rfl:  .  nitroGLYCERIN (NITROSTAT) 0.4 MG SL tablet, Place 1 tablet (0.4 mg total) under the tongue every 5 (five) minutes as needed for chest pain., Disp: 25 tablet, Rfl: 3 .  Omega 3 1200 MG CAPS, Take 2,400 mg by mouth daily., Disp: , Rfl:  .  polyethylene glycol (MIRALAX) packet, Take 17 g by mouth daily., Disp: 14 each, Rfl: 0 .  potassium chloride (K-DUR) 10 MEQ tablet, Take 10 meq, by mouth, on Monday, Wednesday and Friday only, with torsemide., Disp: 53 tablet, Rfl: 3 .  pramipexole (MIRAPEX) 0.75 MG tablet, Take 1.5 mg by mouth every evening. , Disp: , Rfl:  .  ticagrelor (BRILINTA) 90 MG TABS tablet, Take 1 tablet (90 mg total) by mouth 2 (two) times daily., Disp: 180 tablet, Rfl: 3 .  torsemide (DEMADEX) 20 MG tablet, Take 20 mg tablet, twice daily, by mouth on Monday, Wednesday and Friday only., Disp: 105 tablet, Rfl: 3 .  TOUJEO SOLOSTAR 300 UNIT/ML SOPN, Inject 1 Dose into the skin at bedtime., Disp: , Rfl: 3  Exam: Current vital signs: BP (!) 135/121   Pulse 78   Temp 98 F (36.7 C) (Oral)    Resp (!) 22   SpO2 98%  Vital signs in last 24 hours: Temp:  [98 F (36.7 C)] 98 F (36.7 C) (12/19 2239) Pulse Rate:  [78-83] 78 (12/20 0100) Resp:  [20-30] 22 (12/20 0100) BP: (135-194)/(94-122) 135/121 (12/20 0100) SpO2:  [97 %-99 %] 98 % (12/20 0100)  GENERAL: Awake, alert in NAD HEENT: - Normocephalic and atraumatic, dry mm, no LN++, no Thyromegally LUNGS - Clear to auscultation bilaterally with no wheezes CV - S1S2 RRR, no m/r/g, equal pulses bilaterally. ABDOMEN - Soft, nontender, nondistended with normoactive BS Ext: warm, well perfused, intact peripheral pulses, trace edema bilaterally  NEURO:  Mental Status: AA&Ox3  Language: speech is nondysarthric.  Naming, repetition, fluency, and comprehension intact. Cranial Nerves: PERRL.  EOMI, visual fields full, no facial asymmetry, facial sensation intact, hearing intact, tongue/uvula/soft palate midline, normal sternocleidomastoid and trapezius muscle strength. No evidence of tongue atrophy or fibrillations Motor: Right upper and lower extremity 5/5 with no drift.  Right lower extremity 5/5 no drift.  Left lower extremity-4+/5 with mild vertical drift. Tone: is normal and bulk is normal Sensation-diminished sensation on the left hemibody Coordination: FTN intact bilaterally,  Gait- deferred  NIHSS-2   Labs I have reviewed labs in epic and the results pertinent to this consultation are:  CBC    Component Value Date/Time   WBC 7.8 02/05/2019 2310   RBC 5.24 02/05/2019 2310   HGB 15.3 02/06/2019 0043   HGB 11.2 (L) 09/24/2017 1033   HCT 45.0 02/06/2019 0043   HCT 34.2 (L) 09/24/2017 1033   PLT 217 02/05/2019 2310   PLT 362 09/24/2017 1033   MCV 86.8 02/05/2019 2310   MCV 84 09/24/2017 1033   MCH 29.0 02/05/2019 2310   MCHC 33.4 02/05/2019 2310   RDW 12.6 02/05/2019 2310   RDW 15.9 (H) 09/24/2017 1033   LYMPHSABS 1.4 02/05/2019 2310   MONOABS 0.8 02/05/2019 2310   EOSABS 0.3 02/05/2019 2310   BASOSABS 0.1  02/05/2019 2310    CMP     Component Value Date/Time   NA 139 02/06/2019 0043   NA 136 01/05/2018 1138   K 3.9 02/06/2019 0043   CL 101 02/06/2019 0043   CO2 23 12/20/2018 1625   GLUCOSE 260 (H) 02/06/2019 0043   BUN 18 02/06/2019 0043   BUN 28 (H) 01/05/2018 1138   CREATININE 1.20 02/06/2019 0043   CREATININE 1.20 (H) 11/07/2015 0824   CALCIUM 8.5 (L) 12/20/2018 1625   PROT 7.3 10/13/2017 0902   ALBUMIN 4.0 10/13/2017 0902   AST 13 10/13/2017 0902   ALT 19 10/13/2017 0902   ALKPHOS 121 (H) 10/13/2017 0902   BILITOT 0.5 10/13/2017 0902   GFRNONAA 52 (L) 12/20/2018 1625   GFRAA >60 12/20/2018 1625    Imaging I have reviewed the images obtained: CT-scan of the brain-no acute changes.  Remote left basal ganglia infarct.  Assessment:  73 year old man with above past medical history presenting for evaluation of left-sided numbness that started around 3 PM and has improved since but the left arm still feels numb. Low NIH stroke scale and outside the window hence no TPA. No cortical signs to suggest LVO. Likely small vessel stroke from the above risk factors.  Impression: Evaluate for acute ischemic stroke  Recommendations: Continue home aspirin and Brilinta Continue statin Admit -telemetry Frequent neurochecks CTA head and neck MRI brain without contrast 2D echocardiogram A1c Lipid panel Permissive hypertension-for about 24 to 48 hours allow for permissive hypertension treating only if systolic blood pressures greater than 220. Normalize blood pressure upon discharge with a goal less than 140/90. PT OT Speech therapy Bedside stroke swallow screen.  N.p.o. until clears swallow screen. Plan relayed to Dr. Eudelia Bunch Stroke team will follow with you  -- Milon Dikes, MD [facility] Pager: [phone] If 7pm to 7am, please call on call as listed on AMION.

## 2019-02-06 NOTE — H&P (Signed)
History and Physical    James Sendererry W Thew ZOX:[id number] DOB: September 08, 1945 DOA: 02/05/2019  PCP: Joycelyn RuaMeyers, Stephen, MD Consultants:  Mayford Knifeurner - cardiology; Averneni - endocrinology Patient coming from:  Home - lives alone; NOK: Daughter, Nelia ShiLindsey Gorley, [phone]  Chief Complaint: L-sided numbness  HPI: James [name] is a 73 y.o. male with medical history significant of HTN; HLD; DM; CAD s/p CABG; stage 2 CKD; chronic combined CHF; RLS; and AAA presenting with left-sided numbness.  He reports that his left arm was numb.  He was afraid he was having a stroke.  Symptoms started yesterday about 3PM.  Eventually it progressed to entire arm and left side including leg and symptoms lasted for a few hours.  His hand is still somewhat numb but markedly symptoms.  He has had some problems walking and writing for the last couple of weeks.  He used a cane some.  He has no dysphagia.  He denies dysarthria.  His daughter reports that he complains about how expensive his medication is and so he needs assistance with medications.    ED Course:  Carryover, per Dr. Antionette Charpyd:  [age] yom with hx of CAD, chronic combined CHF, IDDM, and mild CKD, presenting with left-sided numbness. No acute findings on head CT. Labs appear stable. Neurology consulting and recommending inpatient CVA workup.   Review of Systems: As per HPI; otherwise review of systems reviewed and negative.   Ambulatory Status:  Ambulates without assistance although recently with some use of cane  Past Medical History:  Diagnosis Date  . Abnormal EKG 03/09/2013  . Abnormal nuclear cardiac imaging test 11/14/2013  . Acute lacunar infarction (HCC) 02/06/2019  . Aortic atherosclerosis (HCC) 07/07/2017  . Arthritis    "all over" (09/30/2017)  . Ascending aortic aneurysm (HCC)    cMRI (2/15): Normal LV EF 54%, Mild BAE, Trileaflet Aortic Valve, Upper limits of normal ascending aorta 3.8 cm, Normal aortic arch 2.3 cm with bovine origin of left carotid  . Atrial  flutter (HCC)    Post-op with no reoccurence  . Chronic diastolic heart failure (HCC)    Echo (1/15): Left ventricle: The cavity size was mildly dilated. Mild LVH. EF 55% to 60%. Wall motion was normal; Gr 1 diastolic dysfunction Left atrium: The atrium was mildly dilated. Right atrium: The atrium was mildly to moderately dilated. Aortic arch measures 4.8 cm (moderately dilated); suggest CTA or MRA to better assess.  . Chronic systolic CHF (congestive heart failure) (HCC) 07/24/2017  . CKD (chronic kidney disease), stage III 07/24/2017  . Coronary artery disease 10/2011   a. s/p cabg;  b. Myoview (9/15):  ant ischemia, poss TID, EF 43%, high risk >> LHC (9/15): Dist LM 50, pLAD 90 then 100, pCFX 50, mCFX 75, oRCA 75, dRCA 95, L-LAD patent, S-Dx 100, S-OM1 patent, S-PDA patent, EF 55% >> native Dx small caliber and not well suited for PCI; native RCA amenable to PCI and would restore flow to PLA branches but would jeopardize SVG-RCA - Med Rx  . Deviated nasal septum   . Dilated aortic root (HCC)   . Diverticulitis of sigmoid colon 07/24/2017  . DM (diabetes mellitus), type 2, uncontrolled with complications (HCC)    onset 2013  . Dyslipidemia   . Edema 02/09/2013  . GERD (gastroesophageal reflux disease)   . Hx of Doppler ultrasound    Carotid US (9/13): No ICA stenosis  . Hypertension   . Pleural thickening 07/24/2017    Past Surgical History:  Procedure Laterality Date  .  CORONARY ARTERY BYPASS GRAFT  10/27/2011   Procedure: CORONARY ARTERY BYPASS GRAFTING (CABG);  Surgeon: Ivin Poot, MD;  Location: [place];  Service: Open Heart Surgery;  Laterality: N/A;  Coronary Artery Bypass Grafting times four using left internal mammary artery and right greater saphenous vein endoscopically harvested  . CORONARY STENT INTERVENTION N/A 09/30/2017   Procedure: CORONARY STENT INTERVENTION;  Surgeon: Jettie Booze, MD;  Location: [facility];  Service: Cardiovascular;  Laterality: N/A;  .  KNEE ARTHROSCOPY Left 2003  . KNEE ARTHROSCOPY Right 2006  . LEFT HEART CATHETERIZATION WITH CORONARY ANGIOGRAM N/A 10/23/2011   Procedure: LEFT HEART CATHETERIZATION WITH CORONARY ANGIOGRAM;  Surgeon: Sueanne Margarita, MD;  Location: [facility];  Service: Cardiovascular;  Laterality: N/A;  . LEFT HEART CATHETERIZATION WITH CORONARY ANGIOGRAM N/A 11/16/2013   Procedure: LEFT HEART CATHETERIZATION WITH CORONARY ANGIOGRAM;  Surgeon: Blane Ohara, MD;  Location: [facility];  Service: Cardiovascular;  Laterality: N/A;  . RIGHT/LEFT HEART CATH AND CORONARY/GRAFT ANGIOGRAPHY N/A 09/30/2017   Procedure: RIGHT/LEFT HEART CATH AND CORONARY/GRAFT ANGIOGRAPHY;  Surgeon: Jettie Booze, MD;  Location: [facility];  Service: Cardiovascular;  Laterality: N/A;    Social History   Socioeconomic History  . Marital status: Divorced    Spouse name: Not on file  . Number of children: Not on file  . Years of education: Not on file  . Highest education level: Not on file  Occupational History  . Occupation: retired  Tobacco Use  . Smoking status: Never Smoker  . Smokeless tobacco: Never Used  Substance and Sexual Activity  . Alcohol use: Yes    Comment: 09/30/2017 "1 beer a month or less"  . Drug use: Never  . Sexual activity: Not Currently  Other Topics Concern  . Not on file  Social History Narrative  . Not on file   Social Determinants of Health   Financial Resource Strain:   . Difficulty of Paying Living Expenses: Not on file  Food Insecurity:   . Worried About Charity fundraiser in the Last Year: Not on file  . Ran Out of Food in the Last Year: Not on file  Transportation Needs:   . Lack of Transportation (Medical): Not on file  . Lack of Transportation (Non-Medical): Not on file  Physical Activity:   . Days of Exercise per Week: Not on file  . Minutes of Exercise per Session: Not on file  Stress:   . Feeling of Stress : Not on file  Social Connections:   . Frequency of  Communication with Friends and Family: Not on file  . Frequency of Social Gatherings with Friends and Family: Not on file  . Attends Religious Services: Not on file  . Active Member of Clubs or Organizations: Not on file  . Attends Archivist Meetings: Not on file  . Marital Status: Not on file  Intimate Partner Violence:   . Fear of Current or Ex-Partner: Not on file  . Emotionally Abused: Not on file  . Physically Abused: Not on file  . Sexually Abused: Not on file    Allergies  Allergen Reactions  . Lisinopril Cough    Family History  Problem Relation Age of Onset  . Hypertension Mother   . Stroke Father 29  . Hypertension Father   . Hypertension Paternal Grandfather   . Heart attack Neg Hx     Prior to Admission medications   Medication Sig Start Date End  Date Taking? Authorizing Provider  acetaminophen (TYLENOL) 500 MG tablet Take 1,000 mg by mouth every 6 (six) hours as needed for headache.    [provider]  aspirin 81 MG tablet Take 81 mg by mouth at bedtime.     [provider]  atorvastatin (LIPITOR) 80 MG tablet Take 1 tablet (80 mg total) by mouth daily. 10/01/17 02/06/19  Kroeger, Ovidio Kin., PA-C  cholecalciferol (VITAMIN D) 1000 units tablet Take 1,000 Units by mouth daily.    [provider]  ezetimibe (ZETIA) 10 MG tablet Take 1 tablet (10 mg total) by mouth daily. Please make overdue appt with Dr. Mayford Knife before anymore refills. 1st attempt Patient taking differently: Take 10 mg by mouth daily.  01/12/19   Bhagat, Sharrell Ku, PA  glimepiride (AMARYL) 4 MG tablet Take 4 mg by mouth daily with breakfast.    [provider]  insulin lispro (HUMALOG) 100 UNIT/ML injection Inject 10 Units into the skin 2 (two) times daily.     [provider]  iron polysaccharides (NIFEREX) 150 MG capsule Take 1 capsule (150 mg total) by mouth daily. 07/30/17   Lenox Ponds, MD  isosorbide mononitrate (IMDUR) 60 MG 24 hr  tablet Take 60 mg by mouth daily.    [provider]  metFORMIN (GLUCOPHAGE) 1000 MG tablet Take 1 tablet (1,000 mg total) by mouth 2 (two) times daily with a meal. 07/30/17 02/06/19  Randel Pigg, Dorma Russell, MD  metoprolol succinate (TOPROL-XL) 100 MG 24 hr tablet Take 125 mg by mouth daily.  08/26/17   [provider]  Multiple Vitamin (MULTIVITAMIN WITH MINERALS) TABS tablet Take 1 tablet by mouth daily.    [provider]  nitroGLYCERIN (NITROSTAT) 0.4 MG SL tablet Place 1 tablet (0.4 mg total) under the tongue every 5 (five) minutes as needed for chest pain. 10/01/17 02/06/19  Kroeger, Ovidio Kin., PA-C  Omega 3 1200 MG CAPS Take 2,400 mg by mouth daily.    [provider]  polyethylene glycol (MIRALAX) packet Take 17 g by mouth daily. 07/30/17   Lenox Ponds, MD  potassium chloride (K-DUR) 10 MEQ tablet Take 10 meq, by mouth, on Monday, Wednesday and Friday only, with torsemide. 01/05/18   Bhagat, Sharrell Ku, PA  pramipexole (MIRAPEX) 0.75 MG tablet Take 1.5 mg by mouth every evening.     [provider]  ticagrelor (BRILINTA) 90 MG TABS tablet Take 1 tablet (90 mg total) by mouth 2 (two) times daily. 10/01/17   Kroeger, Ovidio Kin., PA-C  torsemide (DEMADEX) 20 MG tablet Take 20 mg tablet, twice daily, by mouth on Monday, Wednesday and Friday only. 01/05/18   Bhagat, Bhavinkumar, PA  TOUJEO SOLOSTAR 300 UNIT/ML SOPN Inject 1 Dose into the skin at bedtime. 01/04/18   [provider]    Physical Exam: Vitals:   02/06/19 0739 02/06/19 0900 02/06/19 1106 02/06/19 1300  BP: (!) 166/94 (!) 177/91 (!) 168/94 107/66  Pulse: 69 69 67 68  Resp: (!) Temp: 98.3 F (36.8 C)     TempSrc: Oral  Oral   SpO2: 97% 97%  97%  Weight:   110 kg   Height:    (1.753 m)      . General:  Appears calm and comfortable and is NAD; emotionally labile . Eyes:  PERRL, EOMI, normal lids, iris . ENT:  grossly normal hearing, lips & tongue, mmm;  poor dentition . Neck:  no LAD, masses or thyromegaly . Cardiovascular:  RRR, no m/r/g. No LE edema.  Marland Kitchen Respiratory:   CTA bilaterally with no wheezes/rales/rhonchi.  Normal respiratory effort. . Abdomen:  soft, NT, ND, NABS . Skin:  no rash or induration seen on limited exam . Musculoskeletal:  grossly normal tone BUE/BLE, good ROM, no bony abnormality . Psychiatric:  Emotionally labile mood and affect, speech fluent and appropriate, AOx3 . Neurologic:  CN 2-12 grossly intact, moves all extremities in coordinated fashion, sensation intact    Radiological Exams on Admission: CT ANGIO HEAD W OR WO CONTRAST  Result Date: 02/06/2019 CLINICAL DATA:  Stroke follow-up EXAM: CT ANGIOGRAPHY HEAD AND NECK TECHNIQUE: Multidetector CT imaging of the head and neck was performed using the standard protocol during bolus administration of intravenous contrast. Multiplanar CT image reconstructions and MIPs were obtained to evaluate the vascular anatomy. Carotid stenosis measurements (when applicable) are obtained utilizing NASCET criteria, using the distal internal carotid diameter as the denominator. CONTRAST:  75mL OMNIPAQUE IOHEXOL 350 MG/ML SOLN COMPARISON:  Brain MRI from earlier today FINDINGS: CTA NECK FINDINGS Aortic arch: Atherosclerotic calcification. There is CABG which is incompletely covered. Right carotid system: Mild for age atheromatous plaque at the common carotid bifurcation without ulceration or flow limiting stenosis. Left carotid system: Mild for age atheromatous plaque at the bifurcation without flow limiting stenosis or ulceration. Vertebral arteries: Mild atherosclerosis of the bilateral proximal subclavian artery. The left vertebral artery is strongly dominant. No significant vertebral stenosis or beading. Skeleton: No acute or aggressive finding. Other neck: Negative Upper chest: Negative Review of the MIP images confirms the above findings CTA HEAD FINDINGS Anterior circulation: Calcified  plaque along the carotid siphons without flow limiting stenosis or branch occlusion. Mild atheromatous irregularity of medium size vessels. No branch occlusion. Negative for aneurysm. Posterior circulation: Strong left vertebral artery dominance. Mild atheromatous narrowing at the left vertebrobasilar junction. The basilar is diffusely patent. Aplastic or hypoplastic left P1 segment. Robust bilateral PCA flow with mildly asymmetric atheromatous changes at the right P3 4 segment. No right P1 segment irregularity or narrowing to correlate with the acute thalamic lacune. Venous sinuses: Unremarkable in the arterial phase Anatomic variants: As above Review of the MIP images confirms the above findings IMPRESSION: 1. No emergent finding. 2. Overall mild atheromatous changes for age. Electronically Signed   By: Marnee Spring M.D.   On: 02/06/2019 11:24   CT HEAD WO CONTRAST  Result Date: 02/06/2019 CLINICAL DATA:  Focal neuro deficit, > 6 hrs, stroke suspected. Left-sided numbness. EXAM: CT HEAD WITHOUT CONTRAST TECHNIQUE: Contiguous axial images were obtained from the base of the skull through the vertex without intravenous contrast. COMPARISON:  None. FINDINGS: Brain: No intracranial hemorrhage, mass effect, or midline shift. Brain volume is normal for age. No hydrocephalus. The basilar cisterns are patent. Small remote lacunar infarct in the left basal ganglia. Minor periventricular white matter hypodensities, nonspecific but typically chronic small vessel ischemia. Possible chronic ischemic change in the right pons. No evidence of territorial infarct or acute ischemia. No extra-axial or intracranial fluid collection. Vascular: Atherosclerosis of skullbase vasculature without hyperdense vessel or abnormal calcification. Skull: No fracture or focal lesion. Sinuses/Orbits: No acute findings. Bilateral cataract resection. Other: None. IMPRESSION: 1. No acute intracranial abnormality. 2. Small remote lacunar infarct  in the left basal ganglia. Mild periventricular white matter hypodensity, nonspecific but typically chronic small vessel ischemia. Electronically Signed   By: Narda Rutherford M.D.   On: 02/06/2019 00:47   CT ANGIO NECK W OR WO CONTRAST  Result Date: 02/06/2019  CLINICAL DATA:  Stroke follow-up EXAM: CT ANGIOGRAPHY HEAD AND NECK TECHNIQUE: Multidetector CT imaging of the head and neck was performed using the standard protocol during bolus administration of intravenous contrast. Multiplanar CT image reconstructions and MIPs were obtained to evaluate the vascular anatomy. Carotid stenosis measurements (when applicable) are obtained utilizing NASCET criteria, using the distal internal carotid diameter as the denominator. CONTRAST:  24mL OMNIPAQUE IOHEXOL 350 MG/ML SOLN COMPARISON:  Brain MRI from earlier today FINDINGS: CTA NECK FINDINGS Aortic arch: Atherosclerotic calcification. There is CABG which is incompletely covered. Right carotid system: Mild for age atheromatous plaque at the common carotid bifurcation without ulceration or flow limiting stenosis. Left carotid system: Mild for age atheromatous plaque at the bifurcation without flow limiting stenosis or ulceration. Vertebral arteries: Mild atherosclerosis of the bilateral proximal subclavian artery. The left vertebral artery is strongly dominant. No significant vertebral stenosis or beading. Skeleton: No acute or aggressive finding. Other neck: Negative Upper chest: Negative Review of the MIP images confirms the above findings CTA HEAD FINDINGS Anterior circulation: Calcified plaque along the carotid siphons without flow limiting stenosis or branch occlusion. Mild atheromatous irregularity of medium size vessels. No branch occlusion. Negative for aneurysm. Posterior circulation: Strong left vertebral artery dominance. Mild atheromatous narrowing at the left vertebrobasilar junction. The basilar is diffusely patent. Aplastic or hypoplastic left P1 segment.  Robust bilateral PCA flow with mildly asymmetric atheromatous changes at the right P3 4 segment. No right P1 segment irregularity or narrowing to correlate with the acute thalamic lacune. Venous sinuses: Unremarkable in the arterial phase Anatomic variants: As above Review of the MIP images confirms the above findings IMPRESSION: 1. No emergent finding. 2. Overall mild atheromatous changes for age. Electronically Signed   By: Marnee Spring M.D.   On: 02/06/2019 11:24   MR BRAIN WO CONTRAST  Result Date: 02/06/2019 CLINICAL DATA:  Left-sided numbness that started yesterday EXAM: MRI HEAD WITHOUT CONTRAST TECHNIQUE: Multiplanar, multiecho pulse sequences of the brain and surrounding structures were obtained without intravenous contrast. COMPARISON:  Head CT from earlier today FINDINGS: Brain: Subcentimeter restricted diffusion in the right thalamus. Subcentimeter weakly restricted diffusion in the right centrum semiovale. There is a punctate diffusion hyperintense focus in the right pons on coronal images that is not definite based on the other sequences. There is mild to moderate FLAIR hyperintensity in the cerebral white matter with areas of chronic lacune is in the bilateral centrum semiovale and left frontal white matter. Few remote microhemorrhages with nonspecific pattern given limited extent. No acute hemorrhage, hydrocephalus, or mass. Vascular: Normal flow voids Skull and upper cervical spine: Normal marrow signal Sinuses/Orbits: Bilateral cataract resection. IMPRESSION: 1. Acute lacunar infarct in the right thalamus. 2. Subacute lacunar infarct in the right centrum semiovale. 3. Chronic small vessel ischemia with remote lacunar infarcts. Electronically Signed   By: Marnee Spring M.D.   On: 02/06/2019 07:49   DG Chest Port 1 View  Result Date: 02/06/2019 CLINICAL DATA:  Left-sided numbness. EXAM: PORTABLE CHEST 1 VIEW COMPARISON:  December 20, 2018 FINDINGS: The heart size remains enlarged. There  is persistent consolidation at the left lung base. There is some elevation of the left hemidiaphragm. There are probable small bilateral pleural effusions. The patient is status post prior median sternotomy. There is no pneumothorax. There is vascular congestion. There is no acute osseous abnormality. IMPRESSION: 1. Persistent left basilar consolidation and probable small bilateral pleural effusions. 2. Cardiomegaly with vascular congestion. 3. Low lung volumes. Electronically Signed   By: Cristal Deer  Green M.D.   On: 02/06/2019 00:30    EKG: Independently reviewed.  NSR with rate 83; nonspecific ST changes with no evidence of acute ischemia   Labs on Admission: I have personally reviewed the available labs and imaging studies at the time of the admission.  Pertinent labs:   Glucose 208 BUN 16/Creatinine 1.37/GFR 51 Albumin 3.3 BNP 238.2 HS troponin 18 CBC WNL INR 1.0 UA: >500 glucose; 100 protein UDS negative ETOH <10 COVID negative   Assessment/Plan Principal Problem:   Acute lacunar infarction [facility]) Active Problems:   Hypertension   Coronary artery disease   DM (diabetes mellitus), type 2, uncontrolled with complications (HCC)   Chronic systolic CHF (congestive heart failure) (HCC)   CKD (chronic kidney disease), stage III   Dyslipidemia   Obesity, Class II, BMI 35-39.9   Acute lacunar infarction -Patient presenting with several weeks of ataxia and dysgraphia but acute onset on left-sided numbness yesterday -Concerning for TIA/CVA -MRI confirms acute lacunar infarct in the right thalamus and subacute lacunar infarct in the right centrum semiovale with evidence of chronic lacunar infarcts, as well. -Will admit for further CVA evaluation -Telemetry monitoring -CTA negative of head and neck -Echo pending -Risk stratification with FLP, A1c; will also check TSH and UDS -He reports consistent use of daily ASA and so likely will need DAPT followed by transition to  Plavix -Neurology consult -PT/OT/ST/Nutrition Consults  HTN -Allow permissive HTN for now -Treat BP only if >220/120, and then with goal of 15% reduction -Hold Toprol XL and plan to restart in 48-72 hours   HLD -Continue high-dose Lipitor and Zetia -Based on current lipid panel result (LDL 140), it seems unlikely that the patient has been consistently taking statin therapy   DM, uncontrolled -A1c shows that his hyperglycemia is grossly uncontrolled -Hold home PO medications (amaryl, Glucophage) -Continue Tresiba -Will order resistant-scale SSI  CAD s/p CABG -Continue Imdur, ASA, Brilinta  Chronic systolic CHF  -06/24/17 Echo with EF 40-45% -Appears to be compensated at this time -Hold Demadex for now  Stage 3 CKD -Creatinine appears to be at usual baseline at this time -Will follow  Obesity -BMI 35.81 -Weight loss should be encouraged -Outpatient PCP/bariatric medicine/bariatric surgery f/u encouraged  Noncompliance -Patient's daughter reports inconsistent medication use due to difficulty affording -TOC team consulted requested -I have also encouraged his daughter to have him discuss this issue with PCP and pharmacy staff   Note: This patient has been tested and is negative for the novel coronavirus COVID-19.    DVT prophylaxis:  Lovenox  Code Status: DNR - confirmed with patient Family Communication: None present; I spoke with his daughter by telephone Disposition Plan:  Home once clinically improved Consults called: Neurology; PT/OT/ST/TOC/Nutrition  Admission status: Admit - It is my clinical opinion that admission to INPATIENT is reasonable and necessary because of the expectation that this patient will require hospital care that crosses at least 2 midnights to treat this condition based on the medical complexity of the problems presented.  Given the aforementioned information, the predictability of an adverse outcome is felt to be significant.    Jonah Blue MD [facility]   How to contact the [facility] Attending or Consulting provider 7A - 7P or covering provider during after hours 7P -7A, for this patient?  1. Check the care team in [facility] and look for a) attending/consulting [facility] provider listed and b) the [facility] team listed 2. Log into [email or web address] and use [facility]'s universal password to access. If  you do not have the password, please contact the hospital operator. 3. Locate the [facility] provider you are looking for under [facility] and page to a number that you can be directly reached. 4. If you still have difficulty reaching the provider, please page the [facility] (Director on Call) for the Hospitalists listed on amion for assistance.   02/06/2019, 2:38 PM

## 2019-02-06 NOTE — Progress Notes (Signed)
STROKE TEAM PROGRESS NOTE   HISTORY OF PRESENT ILLNESS (per record) James [name] is a 73 y.o. male past medical history of ascending aortic aneurysm, atrial flutter which was postop with no recurrence, chronic diastolic heart failure, chronic systolic heart failure, CKD, coronary artery disease, diabetes, dyslipidemia, and hypertension, presenting to the emergency room for sudden onset of left arm numbness. He said he was at home working with a dog, when he noticed that his left arm and leg felt as if they are going to sleep.  The leg symptoms lasted about 3 hours but the arm still feels numb. This brought him to the emergency room.  Noncontrast head CT was unremarkable. On symptoms are persistent, for which a neurological consultation was placed for possible stroke/TIA. Denies any similar symptoms in the past. Reports some ongoing mild shortness of breath. LKW: 3 PM on 02/05/2019 tpa given?: no, outside the window Premorbid modified Rankin scale (mRS):0 ROS: ROS was performed and is negative except as noted in the HPI.    INTERVAL HISTORY I have personally reviewed history of presenting illness with the patient, electronic medical records as well as imaging films in PACS.  He presented with left arm and left leg numbness which lasted about 2 to 3 hours and then improved he still having some intermittent tingling in his left hand.  MRI scan shows right thalamic lacunar infarct as well as tiny right basal ganglia as well as a questionable right pontine lacunar infarct as well.  He has no prior history of strokes or TIAs.  CT angiogram shows no significant large vessel stenosis or occlusion.  Rest of stroke work-up is pending.    OBJECTIVE Vitals:   02/06/19 0545 02/06/19 0600 02/06/19 0739 02/06/19 0900  BP: (!) 152/80 (!) 149/84 (!) 166/94 (!) 177/91  Pulse: 71 68 69 69  Resp: (!) 23 18 (!) 22 16  Temp:   98.3 F (36.8 C)   TempSrc:   Oral   SpO2: 100% 96% 97% 97%    CBC:  Recent Labs   Lab 02/05/19 2310 02/06/19 0043  WBC 7.8  --   NEUTROABS 5.2  --   HGB 15.2 15.3  HCT 45.5 45.0  MCV 86.8  --   PLT 217  --     Basic Metabolic Panel:  Recent Labs  Lab 02/05/19 2310 02/06/19 0043  NA 138 139  K 3.9 3.9  CL 106 101  CO2 27  --   GLUCOSE 208* 260*  BUN 16 18  CREATININE 1.37* 1.20  CALCIUM 8.4*  --     Lipid Panel:     Component Value Date/Time   CHOL 209 (H) 02/06/2019 0922   CHOL 116 10/13/2017 0902   TRIG 137 02/06/2019 0922   HDL 42 02/06/2019 0922   HDL 48 10/13/2017 0902   CHOLHDL 5.0 02/06/2019 0922   VLDL 27 02/06/2019 0922   LDLCALC 140 (H) 02/06/2019 0922   LDLCALC 53 10/13/2017 0902   HgbA1c:  Lab Results  Component Value Date   HGBA1C 12.9 (H) 02/06/2019   Urine Drug Screen:     Component Value Date/Time   LABOPIA NONE DETECTED 02/06/2019 0007   COCAINSCRNUR NONE DETECTED 02/06/2019 0007   LABBENZ NONE DETECTED 02/06/2019 0007   AMPHETMU NONE DETECTED 02/06/2019 0007   THCU NONE DETECTED 02/06/2019 0007   LABBARB NONE DETECTED 02/06/2019 0007    Alcohol Level     Component Value Date/Time   ETH <10 02/06/2019 0245  IMAGING  CT HEAD WO CONTRAST 02/06/2019 IMPRESSION:  1. No acute intracranial abnormality.  2. Small remote lacunar infarct in the left basal ganglia. Mild periventricular white matter hypodensity, nonspecific but typically chronic small vessel ischemia.  MR BRAIN WO CONTRAST 02/06/2019 IMPRESSION:  1. Acute lacunar infarct in the right thalamus.  2. Subacute lacunar infarct in the right centrum semiovale.  3. Chronic small vessel ischemia with remote lacunar infarcts.   CTA Head and Neck -only minor atheromatous changes for age no large vessel stenosis or occlusion 02/06/19   DG Chest Port 1 View 02/06/2019 IMPRESSION:  1. Persistent left basilar consolidation and probable small bilateral pleural effusions.  2. Cardiomegaly with vascular congestion.  3. Low lung volumes.   Transthoracic  Echocardiogram  00/00/2020 Pending   ECG - SR rate 83 BPM. (See cardiology reading for complete details)   PHYSICAL EXAM Blood pressure (!) 177/91, pulse 69, temperature 98.3 F (36.8 C), temperature source Oral, resp. rate 16, SpO2 97 %. Obese elderly Caucasian male not in distress. . Afebrile. Head is nontraumatic. Neck is supple without bruit.    Cardiac exam no murmur or gallop. Lungs are clear to auscultation. Distal pulses are well felt.  Neurological Exam ;  Awake  Alert oriented x 3. Normal speech and language.eye movements full without nystagmus.fundi were not visualized. Vision acuity and fields appear normal. Hearing is normal. Palatal movements are normal. Face symmetric. Tongue midline. Normal strength, tone, reflexes and coordination.  Diminished fine finger movements on the left.  Orbits right over left upper extremity.  Normal sensation.  But subjective paresthesias in the left hand.  Gait deferred.  NIH stroke scale 0.  Premorbid modified Rankin scale 0    ASSESSMENT/PLAN [name] is a 73 y.o. male with history of ascending aortic aneurysm, atrial flutter which was postop with no recurrence, chronic diastolic heart failure, chronic systolic heart failure, CKD, coronary artery disease, diabetes, dyslipidemia, and hypertension, presenting to the emergency room for sudden onset of left arm numbness and mild dyspnea. He did not receive IV t-PA due to late presentation (>4.5 hours from time of onset)  Stroke: Acute lacunar infarct in the right thalamus. Subacute lacunar infarct in the right centrum semiovale. Likely due to chronic small vessel ischemia.  Resultant mild left hand paresthesias  Code Stroke CT Head - not ordered  CT head -  MRI head - Acute lacunar infarct in the right thalamus. Subacute lacunar infarct in the right centrum semiovale. Chronic small vessel ischemia with remote lacunar infarcts.  MRA head - not ordered  CTA H&N -no large vessel  stenosis or occlusion.  Minor atheromatous changes.    CT Perfusion - not ordered  Carotid Doppler - CTA neck performed - carotid dopplers not indicated.  2D Echo - pending  Loyal Jacobson Virus 2 - negative  LDL - 140  HgbA1c - 12.9  UDS - negative  VTE prophylaxis - Lovenx Diet  Diet Order            Diet NPO time specified  Diet effective now              aspirin 81 mg daily and Brilinta (ticagrelor) 90 mg bid prior to admission, now on aspirin 81 mg daily and Brilinta (ticagrelor) 90 mg bid  Patient counseled to be compliant with his antithrombotic medications  Ongoing aggressive stroke risk factor management  Therapy recommendations:  pending  Disposition:  Pending  Hypertension  Home BP meds: Toprol XL 100 mg  daily   Current BP meds: none   Stable . Permissive hypertension (OK if < 220/120) but gradually normalize in 5-7 days   . Long-term BP goal normotensive  Hyperlipidemia  Home Lipid lowering medication: Lipitor 80 mg daily and Zetia 10 mg daily  LDL 140, goal < 70  Current lipid lowering medication: Lipitor 40 mg daily and Zetia 10 mg daily  Continue statin at discharge  Diabetes   Home diabetic meds: Amaryl, Glucophage , insulin  Current diabetic meds: insulin  HgbA1c 12.9, goal < 7.0 No results for input(s): GLUCAP in the last 72 hours.  Other Stroke Risk Factors  Advanced age  ETOH use, advised to drink no more than 1 alcoholic beverage per day.  Mild Obesity, There is no height or weight on file to calculate BMI., recommend weight loss, diet and exercise as appropriate   Family hx stroke (father)  Coronary artery disease  Congestive Heart Failure  One known post op episode of  A Flutter  Old lacunar infarcts  Other Active Problems  CKD - creatinine - 1.37->1.2  Lisinopril allergy  DNR  NPO  CXR - Persistent left basilar consolidation and probable small bilateral pleural effusions. Cardiomegaly with vascular  congestion.    Hospital day # 0  I have personally obtained history,examined this patient, reviewed notes, independently viewed imaging studies, participated in medical decision making and plan of care.ROS completed by me personally and pertinent positives fully documented  I have made any additions or clarifications directly to the above note.  He presented with left arm and leg greater than face paresthesias likely due to right thalamic lacunar infarct.  MRI also shows tiny right subcortical as well as pontine and additional lacunar infarcts due to small vessel disease.  Recommend continue aspirin and Brilinta given his cardiac history and aggressive risk factor modification.  Continue ongoing stroke work-up.  Patient appears to be at risk for obstructive sleep apnea given his body habitus and may benefit with consideration for participation in the sleep smart stroke prevention study.  He appears interested and will give him written information to review and decide.  Discussed with Dr. Jonah BlueJennifer Yates.  Greater than 50% time during this 35-minute visit was spent on counseling and coordination of care about his lacunar infarct and discussion about stroke prevention and treatment and answering questions  Delia HeadyPramod Dannika Hilgeman, MD Medical Director [facility] Pager: [phone] 02/06/2019 2:55 PM   To contact Stroke Continuity provider, please refer to [email or web address]. After hours, contact General Neurology

## 2019-02-06 NOTE — ED Notes (Signed)
Breakfast Tray Ordered @ [id number].

## 2019-02-07 ENCOUNTER — Other Ambulatory Visit: Payer: Self-pay | Admitting: Medical

## 2019-02-07 DIAGNOSIS — G459 Transient cerebral ischemic attack, unspecified: Secondary | ICD-10-CM

## 2019-02-07 DIAGNOSIS — G4733 Obstructive sleep apnea (adult) (pediatric): Secondary | ICD-10-CM

## 2019-02-07 DIAGNOSIS — E785 Hyperlipidemia, unspecified: Secondary | ICD-10-CM

## 2019-02-07 DIAGNOSIS — N1831 Chronic kidney disease, stage 3a: Secondary | ICD-10-CM

## 2019-02-07 LAB — GLUCOSE, CAPILLARY
Glucose-Capillary: 188 mg/dL — ABNORMAL HIGH (ref 70–99)
Glucose-Capillary: 213 mg/dL — ABNORMAL HIGH (ref 70–99)

## 2019-02-07 MED ORDER — GLUCERNA SHAKE PO LIQD
237.0000 mL | Freq: Two times a day (BID) | ORAL | Status: DC
  Filled 2019-02-07: qty 237

## 2019-02-07 MED ORDER — METFORMIN HCL 1000 MG PO TABS: 1000.0000 mg | ORAL_TABLET | Freq: Every day | ORAL | Status: DC

## 2019-02-07 MED ORDER — CLOPIDOGREL BISULFATE 75 MG PO TABS: 75.0000 mg | ORAL_TABLET | Freq: Every day | ORAL | 1 refills | Status: AC

## 2019-02-07 MED ORDER — ASPIRIN 81 MG PO TABS: 81.0000 mg | ORAL_TABLET | Freq: Every day | ORAL | Status: DC

## 2019-02-07 MED FILL — CLOPIDOGREL 75 MG TABLET: 75 | 30 days supply | Qty: 30 | Fill #0

## 2019-02-07 NOTE — Progress Notes (Signed)
Initial Nutrition Assessment  DOCUMENTATION CODES:   Obesity unspecified  INTERVENTION:  -Glucerna Shake po BID, each supplement provides 220 kcal and 10 grams of protein   NUTRITION DIAGNOSIS:   Increased nutrient needs related to acute illness, chronic illness(acute lacunar infarction; CKD3) as evidenced by estimated needs.  GOAL:   Patient will meet greater than or equal to 90% of their needs  MONITOR:   PO intake, I & O's, Supplement acceptance, Weight trends, Labs  REASON FOR ASSESSMENT:   Malnutrition Screening Tool, Consult Assessment of nutrition requirement/status  ASSESSMENT:  RD working remotely.  73 year old male with medical history significant of HTN, HLD, T2DM, CAD s/p CABG, CKD2, chronic combined CHF, RLS, and AAA who presented with left-sided numbness. MRI showed acute lacunar infarct in right thalamus and subacute lacunar infarct in right centrum semiovale with evidence of chronic lacunar infarct.  Patient eating 100% of 2 recorded meals this admission. Will provide Glucerna twice daily to aid with calorie/protein needs.  Per chart, pt seen by diabetes coordinator this morning for A1c 12.9.  Pt reports inconsistent with taking home medications for DM secondary to price of medications. Diabetes coordinator educated pt on discussing financial hardships with Endo and pharmacy to help assist in finding more affordable alternatives.   Patient reports a new dog and a grandson at home that he needs to be healthy for and motivated to make lifestyle changes.   I/Os: +48 ml since admit UOP 830 ml x 24 hrs  Current wt 110 kg (242 lbs) +2 BLE edema per review of RN assessment No recent wt history available for review. In 2019 pt wts 107.2 kg - 119.1 kg  Medications reviewed and include: SS novolog, Niferex, Imdur  Labs: CBGs 170-217 x 24 hrs Lab Results  Component Value Date   HGBA1C 12.9 (H) 02/06/2019    NUTRITION - FOCUSED PHYSICAL EXAM: Unable to  complete at this time, RD working remotely.   Diet Order:   Diet Order            Diet heart healthy/carb modified Room service appropriate? Yes; Fluid consistency: Thin  Diet effective now              EDUCATION NEEDS:   No education needs have been identified at this time  Skin:  Skin Assessment: Reviewed RN Assessment  Last BM:  12/19  Height:   Ht Readings from Last 1 Encounters:  02/06/19 5\' 9"  (1.753 m)    Weight:   Wt Readings from Last 1 Encounters:  02/06/19 110 kg    Ideal Body Weight:  72.7 kg  BMI:  Body mass index is 35.81 kg/m.  Estimated Nutritional Needs:   Kcal:  6389-3734  Protein:  92-101  Fluid:  >/= 1.8 L/day    Lajuan Lines, RD, LDN Clinical Nutrition Office [phone] After Hours/Weekend Pager: [phone]

## 2019-02-07 NOTE — Progress Notes (Signed)
Inpatient Diabetes Program Recommendations  AACE/ADA: New Consensus Statement on Inpatient Glycemic Control (2015)  Target Ranges:  Prepandial:   less than 140 mg/dL      Peak postprandial:   less than 180 mg/dL (1-2 hours)      Critically ill patients:  140 - 180 mg/dL   Results for TRIPTON, NED (MRN [id number]) as of 02/07/2019 11:34  Ref. Range 02/06/2019 11:24 02/06/2019 16:36 02/06/2019 21:22 02/07/2019 05:46 02/07/2019 11:00  Glucose-Capillary Latest Ref Range: 70 - 99 mg/dL 160 (H)  8 units NOVOLOG  217 (H)  11 units NOVOLOG  170 (H) 188 (H)  8 units NOVOLOG  213 (H)  11 units NOVOLOG    Results for ALEXANDE, SHEERIN (MRN [id number]) as of 02/07/2019 11:34  Ref. Range 07/25/2017 03:58 02/06/2019 09:22  Hemoglobin A1C Latest Ref Range: 4.8 - 5.6 % 8.4 (H) 12.9 (H)  (323 mg/dl)    Admit with: Acute CVA  History: DM, CABG, CKD, CHF  Home DM Meds: Toujeo 24 units QHS       Humalog 10 units BID       Metformin 1000 mg Daily       Amaryl 4 mg Daily  Current Orders: Novolog Resistant Correction Scale/ SSI (0-20 units) TID AC + HS      Novolog 4 units TID with meals     Endocrinologist: Dr. Garnet Koyanagi with [facility]--Last seen 11/10/2018   Called ENDO office and spoke w/ Medical Assistant--Patient was instructed to restart his Ozempic 0.5 mg Qweek at the last visit. Also supposed to be taking the following:  Toujeo 24 units QHS Novolog 8 units TID with meals + SSI for elevated CBGs Metformin 500 mg BID Amaryl 4 mg Daily    Spoke w/ this pt this AM and clarified home meds as he is taking them.  The only difference in the above list is that patient is taking his Metformin 1000 mg Daily (not 500 mg BID).  Pt told me is just restarted his home dose of Ozempic 0.5 mg once weekly about 2 weeks ago.  Stated the Ozempic is expensive and that his ENDO MD gave him some samples to help.  Stated the price of his Novolog should reduce to $35 and that his  Nelva Nay is the only other med that is very expensive (>$100 per month).  Explored w/ pt the possibility of calling his ENDO office or using his pharmacy to help him find alternative lower cost basal insulin to take at home.  Pt stated this sounded like a good plan and plans to check into less expensive basal insulin options.  Pt stated to me that he also has to pay $100 for his Freestyle libre CGM for home.  Discussed less expensive CBG meter options with pt, however, pt stated he likes being able to scan for CBGs frequently.    Reviewed current A1c of 12.9% with pt.  Reminded pt about the extreme importance of good CBG control at home to help prevent further cardiac events.  Pt stated he knew it was high and knows he needs to be more consistent with taking meds at home.  Stated to me he has a new dog and a grandson living with him and needs to be healthy for them as well.  Seems motivated to make changes for better CBG control at home.     --Will follow patient during hospitalization--  Wyn Quaker RN, MSN, CDE Diabetes Coordinator Inpatient Glycemic Control Team Team Pager:  [phone] (8a-5p)

## 2019-02-07 NOTE — Progress Notes (Signed)
STROKE TEAM PROGRESS NOTE    INTERVAL HISTORY Patient is doing well.  No neurological complaints he is able to ambulate without assistance.  He has no therapy needs.  He wants to go home.  The patient decided not to participate in the sleep smart stroke prevention study Echo shows slightly diminished ejection fraction of 40 to 45% but no definite clot or wall motion abnormality.  He has remote history of transient postop atrial flutter but no other atrial fibrillation history.   OBJECTIVE Vitals:   02/06/19 2325 02/07/19 0433 02/07/19 0804 02/07/19 1222  BP: 138/86 (!) 162/88 132/78 (!) 165/85  Pulse: 77 77 63 89  Resp: 18 18 16 16   Temp: 98.4 F (36.9 C) 98 F (36.7 C) 98 F (36.7 C) 98 F (36.7 C)  TempSrc: Oral Oral Oral Oral  SpO2: 98% 97% 99% 96%  Weight:      Height:        CBC:  Recent Labs  Lab 02/05/19 2310 02/06/19 0043  WBC 7.8  --   NEUTROABS 5.2  --   HGB 15.2 15.3  HCT 45.5 45.0  MCV 86.8  --   PLT 217  --     Basic Metabolic Panel:  Recent Labs  Lab 02/05/19 2310 02/06/19 0043  NA 138 139  K 3.9 3.9  CL 106 101  CO2 27  --   GLUCOSE 208* 260*  BUN 16 18  CREATININE 1.37* 1.20  CALCIUM 8.4*  --     Lipid Panel:     Component Value Date/Time   CHOL 209 (H) 02/06/2019 0922   CHOL 116 10/13/2017 0902   TRIG 137 02/06/2019 0922   HDL 42 02/06/2019 0922   HDL 48 10/13/2017 0902   CHOLHDL 5.0 02/06/2019 0922   VLDL 27 02/06/2019 0922   LDLCALC 140 (H) 02/06/2019 0922   LDLCALC 53 10/13/2017 0902   HgbA1c:  Lab Results  Component Value Date   HGBA1C 12.9 (H) 02/06/2019   Urine Drug Screen:     Component Value Date/Time   LABOPIA NONE DETECTED 02/06/2019 0007   COCAINSCRNUR NONE DETECTED 02/06/2019 0007   LABBENZ NONE DETECTED 02/06/2019 0007   AMPHETMU NONE DETECTED 02/06/2019 0007   THCU NONE DETECTED 02/06/2019 0007   LABBARB NONE DETECTED 02/06/2019 0007    Alcohol Level     Component Value Date/Time   ETH <10 02/06/2019  0245    IMAGING  CT HEAD WO CONTRAST 02/06/2019 IMPRESSION:  1. No acute intracranial abnormality.  2. Small remote lacunar infarct in the left basal ganglia. Mild periventricular white matter hypodensity, nonspecific but typically chronic small vessel ischemia.  MR BRAIN WO CONTRAST 02/06/2019 IMPRESSION:  1. Acute lacunar infarct in the right thalamus.  2. Subacute lacunar infarct in the right centrum semiovale.  3. Chronic small vessel ischemia with remote lacunar infarcts.   CTA Head and Neck -only minor atheromatous changes for age no large vessel stenosis or occlusion 02/06/19   DG Chest Port 1 View 02/06/2019 IMPRESSION:  1. Persistent left basilar consolidation and probable small bilateral pleural effusions.  2. Cardiomegaly with vascular congestion.  3. Low lung volumes.   Transthoracic Echocardiogram  Mildly diminished ejection fraction 40 to 45%.  No clot  ECG - SR rate 83 BPM. (See cardiology reading for complete details)   PHYSICAL EXAM Blood pressure (!) 165/85, pulse 89, temperature 98 F (36.7 C), temperature source Oral, resp. rate 16, height 5\' 9"  (1.753 m), weight 110 kg, SpO2 96 %.  Obese elderly Caucasian male not in distress. . Afebrile. Head is nontraumatic. Neck is supple without bruit.    Cardiac exam no murmur or gallop. Lungs are clear to auscultation. Distal pulses are well felt.  Neurological Exam ;  Awake  Alert oriented x 3. Normal speech and language.eye movements full without nystagmus.fundi were not visualized. Vision acuity and fields appear normal. Hearing is normal. Palatal movements are normal. Face symmetric. Tongue midline. Normal strength, tone, reflexes and coordination.  Diminished fine finger movements on the left.  Orbits right over left upper extremity.  Normal sensation.  But subjective paresthesias in the left hand.  Gait deferred.  NIH stroke scale 0.  Premorbid modified Rankin scale 0    ASSESSMENT/PLAN James [name]  is a 73 y.o. male with history of ascending aortic aneurysm, atrial flutter which was postop with no recurrence, chronic diastolic heart failure, chronic systolic heart failure, CKD, coronary artery disease, diabetes, dyslipidemia, and hypertension, presenting to the emergency room for sudden onset of left arm numbness and mild dyspnea. He did not receive IV t-PA due to late presentation (>4.5 hours from time of onset)  Stroke: Acute lacunar infarct in the right thalamus. Subacute lacunar infarct in the right centrum semiovale. Likely due to chronic small vessel ischemia.  Resultant mild left hand paresthesias  Code Stroke CT Head - not ordered  CT head -  MRI head - Acute lacunar infarct in the right thalamus. Subacute lacunar infarct in the right centrum semiovale. Chronic small vessel ischemia with remote lacunar infarcts.  MRA head - not ordered  CTA H&N -no large vessel stenosis or occlusion.  Minor atheromatous changes.    CT Perfusion - not ordered  Carotid Doppler - CTA neck performed - carotid dopplers not indicated.  2D Echo -diminished ejection fraction 40 to 45%.  No clot.  Sars Corona Virus 2 - negative  LDL - 140  HgbA1c - 12.9  UDS - negative  VTE prophylaxis - Lovenx Diet  Diet Order            Diet heart healthy/carb modified Room service appropriate? Yes; Fluid consistency: Thin  Diet effective now              aspirin 81 mg daily and Brilinta (ticagrelor) 90 mg bid prior to admission, now on aspirin 81 mg daily and Brilinta (ticagrelor) 90 mg bid  Patient counseled to be compliant with his antithrombotic medications  Ongoing aggressive stroke risk factor management  Therapy recommendations: None  disposition: Home Hypertension  Home BP meds: Toprol XL 100 mg daily   Current BP meds: none   Stable . Permissive hypertension (OK if < 220/120) but gradually normalize in 5-7 days   . Long-term BP goal normotensive  Hyperlipidemia  Home Lipid  lowering medication: Lipitor 80 mg daily and Zetia 10 mg daily  LDL 140, goal < 70  Current lipid lowering medication: Lipitor 40 mg daily and Zetia 10 mg daily  Continue statin at discharge  Diabetes   Home diabetic meds: Amaryl, Glucophage , insulin  Current diabetic meds: insulin  HgbA1c 12.9, goal < 7.0 Recent Labs    02/06/19 2122 02/07/19 0546 02/07/19 1100  GLUCAP 170* 188* 213*    Other Stroke Risk Factors  Advanced age  ETOH use, advised to drink no more than 1 alcoholic beverage per day.  Mild Obesity, Body mass index is 35.81 kg/m., recommend weight loss, diet and exercise as appropriate   Family hx stroke (father)  Coronary  artery disease  Congestive Heart Failure  One known post op episode of  A Flutter  Old lacunar infarcts  Other Active Problems  CKD - creatinine - 1.37->1.2  Lisinopril allergy  DNR  NPO  CXR - Persistent left basilar consolidation and probable small bilateral pleural effusions. Cardiomegaly with vascular congestion.    Hospital day # 1    He presented with left arm and leg greater than face paresthesias likely due to right thalamic lacunar infarct.  MRI also shows tiny right subcortical as well as pontine and additional lacunar infarcts due to small vessel disease.  Recommend continue aspirin and Brilinta given his cardiac history and aggressive risk factor modification.  Recommend 30-day heart monitor for paroxysmal A. fib and outpatient referral for polysomnogram for sleep apnea.  He also consider switching to the new PCSK9 inhibitor injections for his elevated lipids despite full dose of statin.  Discussed with Dr. Marcellus Scott. greater than 50% time during this 25-minute visit was spent on counseling and coordination of care about his lacunar infarct and discussion about stroke prevention and treatment and answering questions.  Follow-up as outpatient stroke clinic in 6 weeks.  Delia Heady, MD Medical Director [facility] Pager: [phone] 02/07/2019 1:45 PM   To contact Stroke Continuity provider, please refer to [email or web address]. After hours, contact General Neurology

## 2019-02-07 NOTE — Evaluation (Signed)
Occupational Therapy Evaluation Patient Details Name: James [name] MRN: [id number] DOB: Dec 01, 1945 Today's Date: 02/07/2019    History of Present Illness James [name] is a 73 y.o. male with medical history significant of HTN; HLD; DM; CAD s/p CABG; stage 2 CKD; chronic combined CHF; RLS; and AAA presenting with left arm and hand numbness. MRI scan shows right thalamic lacunar infarct as well as tiny right basal ganglia as well as a questionable right pontine lacunar infarct as well.   Clinical Impression   Patient is a 73 year old male that lives alone in a 2 story home, stays main level with 4 steps to enter. Patient uses a single point cane intermittently however has had falls recently. Currently patient is supervision level for self care and functional mobility + transfers. Observe mild dyspnea with self care tasks and balance deficits however no overt loss of balance during session. Will recommend continued acute OT services to maximize patient safety and independence with daily self care.     Follow Up Recommendations  No OT follow up;Supervision - Intermittent    Equipment Recommendations  None recommended by OT       Precautions / Restrictions Precautions Precautions: Fall Precaution Comments: Fall risk present, but minimal Restrictions Weight Bearing Restrictions: No      Mobility Bed Mobility               General bed mobility comments: sitting in chair upon arrival  Transfers Overall transfer level: Needs assistance Equipment used: None Transfers: Sit to/from Stand Sit to Stand: Supervision         General transfer comment: not requiring physical assist, needs increased time.     Balance Overall balance assessment: Mild deficits observed, not formally tested                                         ADL either performed or assessed with clinical judgement   ADL Overall ADL's : Needs assistance/impaired     Grooming: Set  up;Sitting   Upper Body Bathing: Set up;Sitting   Lower Body Bathing: Set up;Supervison/ safety;Sitting/lateral leans;Sit to/from stand   Upper Body Dressing : Set up;Sitting   Lower Body Dressing: Set up;Supervision/safety;Sitting/lateral leans;Sit to/from stand Lower Body Dressing Details (indicate cue type and reason): patient demo ability to doff/don socks, supervision for safety as patient leans very far foward, mild dyspnea with this activity Toilet Transfer: Supervision/safety;Ambulation Toilet Transfer Details (indicate cue type and reason): simulated with sit to stand from chair, supervision for safety d/t initial stand patient LE feeling stiff with mild balance deficits Toileting- Clothing Manipulation and Hygiene: Supervision/safety;Sitting/lateral lean;Sit to/from stand       Functional mobility during ADLs: Supervision/safety General ADL Comments: pt at supervision level however observe labored breathing with LB dressing, functional ambulation and mild balance deficits     Vision Patient Visual Report: No change from baseline Vision Assessment?: No apparent visual deficits     Perception Perception Comments: dysmetria testing negative       Pertinent Vitals/Pain Pain Assessment: No/denies pain     Hand Dominance Right   Extremity/Trunk Assessment Upper Extremity Assessment Upper Extremity Assessment: LUE deficits/detail RUE Deficits / Details: AROM WFL RUE Sensation: WNL LUE Deficits / Details: shoulder flexion AROM ~90 degrees, pt reports hurt it lifting up dog for a bath years ago LUE Sensation: WNL LUE Coordination: WNL   Lower Extremity  Assessment Lower Extremity Assessment: Defer to PT evaluation   Cervical / Trunk Assessment Cervical / Trunk Assessment: Normal   Communication Communication Communication: No difficulties   Cognition Arousal/Alertness: Awake/alert Behavior During Therapy: WFL for tasks assessed/performed Overall Cognitive Status:  Within Functional Limits for tasks assessed                                     General Comments  Educated/reviewed signs and symptoms of CVA and risk factors, including modifiable risk factors            Home Living Family/patient expects to be discharged to:: Private residence Living Arrangements: Alone Available Help at Discharge: Family;Available PRN/intermittently Type of Home: House Home Access: Stairs to enter Entrance Stairs-Number of Steps: 4   Home Layout: Two level;Able to live on main level with bedroom/bathroom     Bathroom Shower/Tub: Walk-in shower;Tub/shower unit   Bathroom Toilet: Handicapped height Bathroom Accessibility: Yes How Accessible: Accessible via walker Home Equipment: Grab bars - tub/shower;Shower seat;Cane - single point   Additional Comments: pt reports he goes into the basement once every 2 months, has exercise equipment in the basement      Prior Functioning/Environment Level of Independence: Independent with assistive device(s)        Comments: Uses cane intermittently, history of 2 falls in past month        OT Problem List: Decreased activity tolerance;Impaired balance (sitting and/or standing);Decreased safety awareness;Obesity      OT Treatment/Interventions: Self-care/ADL training;Therapeutic exercise;Energy conservation;DME and/or AE instruction;Therapeutic activities;Patient/family education;Balance training    OT Goals(Current goals can be found in the care plan section) Acute Rehab OT Goals Patient Stated Goal: "play with my grandson." OT Goal Formulation: With patient Time For Goal Achievement: 02/21/19 Potential to Achieve Goals: Good  OT Frequency: Min 2X/week    AM-PAC OT "6 Clicks" Daily Activity     Outcome Measure Help from another person eating meals?: None Help from another person taking care of personal grooming?: A Little Help from another person toileting, which includes using toliet,  bedpan, or urinal?: A Little Help from another person bathing (including washing, rinsing, drying)?: A Little Help from another person to put on and taking off regular upper body clothing?: A Little Help from another person to put on and taking off regular lower body clothing?: A Little 6 Click Score: 19   End of Session Nurse Communication: Mobility status  Activity Tolerance: Patient tolerated treatment well Patient left: in chair;with call bell/phone within reach  OT Visit Diagnosis: Unsteadiness on feet (R26.81);History of falling (Z91.81)                Time: 6295-2841 OT Time Calculation (min): 15 min Charges:  OT General Charges $OT Visit: 1 Visit OT Evaluation $OT Eval Moderate Complexity: [place] OT OT office: [place] 02/07/2019, 12:23 PM

## 2019-02-07 NOTE — Discharge Summary (Signed)
Physician Discharge Summary  James [name] [facility] WGN:[id number] DOB: 11/02/1945  PCP: Orpah Melter, MD  Admitted from: Home Discharged to: Home  Admit date: 02/05/2019 Discharge date: 02/07/2019  Recommendations for Outpatient Follow-up:   Follow-up Information    [facility]. Schedule an appointment as soon as possible for a visit.   Specialty: Rehabilitation Contact information: [place] [phone] [place]       Orpah Melter, MD. Schedule an appointment as soon as possible for a visit in 1 week(s).   Specialty: Family Medicine Why: To be seen with repeat labs (CBC & BMP).  Follow-up regarding recent stroke and better control of your diabetes. Contact information: [address] [place] [address] [phone]        Sueanne Margarita, MD. Schedule an appointment as soon as possible for a visit.   Specialty: Cardiology Why: Office will call you to arrange for outpatient heart monitor and sleep study. Contact information: [address] [place] [address] [phone]        Madelin Rear, MD. Schedule an appointment as soon as possible for a visit in 1 week(s).   Specialty: Endocrinology Why: Please call for an early appointment for better management of your poorly controlled diabetes. Contact information: [address] [place] [address] [phone]        Garvin Fila, MD. Schedule an appointment as soon as possible for a visit in 6 week(s).   Specialties: Neurology, Radiology Contact information: [address] [place] [address] [place]: Outpatient OT Equipment/Devices: None  Discharge Condition: Improved and stable CODE STATUS: DNR Diet recommendation: Heart healthy & diabetic diet  Discharge Diagnoses:  Principal Problem:   Acute lacunar infarction  [facility]) Active Problems:   Hypertension   Coronary artery disease   DM (diabetes mellitus), type 2, uncontrolled with complications (HCC)   Chronic systolic CHF (congestive heart failure) (HCC)   CKD (chronic kidney disease), stage III   Dyslipidemia   Obesity, Class II, BMI 35-39.9   Brief Summary: 73 year old male lives alone, ambulates with the help of cane, PMH of poorly controlled type II DM/IDDM, HTN, HLD, CAD s/p CABG, stage III CKD, chronic combined CHF, RLS, AAA, postop atrial flutter presented with left-sided numbness.  He was admitted for acute stroke.  Neurology consulted.  Assessment and plan:  Acute ischemic stroke  Neurology consulted and assisted with evaluation and management  Acute lacunar infarct in the right thalamus.  Subacute lacunar infarct in the right centrum semiovale.  Etiology: Felt to be due to chronic small vessel ischemia but embolic etiology not completely ruled out.  Resultant mild left hand paresthesia, almost resolved.  CT head 12/20: No acute abnormality.  Small remote lacunar infarct in the left basal ganglia.  Chronic small vessel ischemia.  MRI brain: Acute lacunar infarct in the right thalamus.  Subacute lacunar infarct in the right centrum semiovale.  CTA head and neck: No emergent finding.  TTE: LVEF 40-45%, mild LVH, LV demonstrates global hypokinesis.  COVID-19 testing negative.  LDL 140, A1c 12.9  In the hospital, briefly on aspirin 81 mg daily and Brilinta until it was confirmed that patient was not taking Brilinta PTA.  I discussed with Dr. Leonie Man, neurology who recommended aspirin plus Plavix for 3 weeks followed by Plavix alone.  I confirmed with Dr. Radford Pax, cardiology that he  does not need Brilinta and can be switched to antiplatelet recommendations as noted above.  Aggressive stroke risk reduction, especially poorly controlled DM and HLD.  Close outpatient follow-up with his endocrinologist.  As recommended by neurology,  discussed with cardiology to arrange outpatient 30-day heart monitor and sleep study.  As per therapy's recommendation, outpatient PT arranged.   Poorly controlled type II DM/IDDM with hyperglycemia  A1c 12.9, goal <7  Concern regarding compliance with medications,?  Affordability  Discussed in detail with diabetes coordinator who met with patient, verified his regimen with his endocrinologist office.  Patient reportedly has not been taking all of his medications as he was supposed to.  Discussed in detail with him regarding importance of compliance with medications and all aspects of his medical care.  He verbalized understanding.  He is on Amaryl, Metformin, Toujeo, NovoLog and samples of Ozempic.  Continue current regimen as below with close outpatient follow-up with his endocrinologist for further management.  Hyperlipidemia  LDL 140, goal <70  Continue atorvastatin and Zetia.  Again question regarding compliance with medications.  Essential hypertension  Mildly uncontrolled.  Allowed for permissive hypertension.  Continue prior home dose of Toprol-XL.  CAD s/p CABG  No anginal symptoms.  Continue aspirin (to be discontinued in 3 weeks), Plavix, statins and Imdur.  Was not on Brilinta PTA.  Chronic combined systolic and diastolic CHF/cardiomyopathy.  Compensated.  Continue prior home dose of diuretics.  I discussed with patient's primary cardiologist who reassessed his TTE from this admission.  She feels that current echo and previous echo are similar without change in LV EF.  Stage III chronic kidney disease  Creatinine appears to be at baseline.  Obesity/Body mass index is 35.81 kg/m.  Recommend outpatient sleep study, Dr. Radford Pax will arrange.  Medication noncompliance  Discussed in detail with patient and his daughter, counseled regarding importance of compliance with all aspects of medical care.  Consultations:  Neurology  Cardiology  Procedures:  As  above   Discharge Instructions  Discharge Instructions    (HEART FAILURE PATIENTS) Call MD:  Anytime you have any of the following symptoms: 1) 3 pound weight gain in 24 hours or 5 pounds in 1 week 2) shortness of breath, with or without a dry hacking cough 3) swelling in the hands, feet or stomach 4) if you have to sleep on extra pillows at night in order to breathe.   Complete by: As directed    Ambulatory referral to Physical Therapy   Complete by: As directed    Iontophoresis - 4 mg/ml of dexamethasone: No   T.E.N.S. Unit Evaluation and Dispense as Indicated: No   Call MD for:   Complete by: As directed    Recurrent strokelike symptoms.   Call MD for:  difficulty breathing, headache or visual disturbances   Complete by: As directed    Call MD for:  extreme fatigue   Complete by: As directed    Call MD for:  persistant dizziness or light-headedness   Complete by: As directed    Call MD for:  persistant nausea and vomiting   Complete by: As directed    Call MD for:  severe uncontrolled pain   Complete by: As directed    Call MD for:  temperature >100.4   Complete by: As directed    Diet - low sodium heart healthy   Complete by: As directed    Diet Carb Modified   Complete by: As directed    Increase activity slowly  Complete by: As directed        Medication List    STOP taking these medications   ticagrelor 90 MG Tabs tablet Commonly known as: BRILINTA     TAKE these medications   acetaminophen 500 MG tablet Commonly known as: TYLENOL Take 1,000 mg by mouth every 6 (six) hours as needed for headache.   aspirin 81 MG tablet Take 1 tablet (81 mg total) by mouth at bedtime. Take for next 21 days and then stop. What changed: additional instructions   atorvastatin 80 MG tablet Commonly known as: Lipitor Take 1 tablet (80 mg total) by mouth daily.   cholecalciferol 1000 units tablet Commonly known as: VITAMIN D Take 1,000 Units by mouth daily.   clopidogrel  75 MG tablet Commonly known as: Plavix Take 1 tablet (75 mg total) by mouth daily.   ezetimibe 10 MG tablet Commonly known as: ZETIA Take 1 tablet (10 mg total) by mouth daily. Please make overdue appt with Dr. Radford Pax before anymore refills. 1st attempt What changed: additional instructions   glimepiride 4 MG tablet Commonly known as: AMARYL Take 4 mg by mouth daily with breakfast.   insulin aspart 100 UNIT/ML injection Commonly known as: novoLOG Inject 8 Units into the skin See admin instructions. Three times a day with meals and as needed using sliding scale.   iron polysaccharides 150 MG capsule Commonly known as: NIFEREX Take 1 capsule (150 mg total) by mouth daily.   isosorbide mononitrate 60 MG 24 hr tablet Commonly known as: IMDUR Take 60 mg by mouth daily.   metFORMIN 1000 MG tablet Commonly known as: Glucophage Take 1 tablet (1,000 mg total) by mouth daily with breakfast.   metoprolol succinate 100 MG 24 hr tablet Commonly known as: TOPROL-XL Take 125 mg by mouth daily.   multivitamin with minerals Tabs tablet Take 1 tablet by mouth daily.   nitroGLYCERIN 0.4 MG SL tablet Commonly known as: Nitrostat Place 1 tablet (0.4 mg total) under the tongue every 5 (five) minutes as needed for chest pain.   Omega 3 1200 MG Caps Take 2,400 mg by mouth daily.   Ozempic (0.25 or 0.5 MG/DOSE) 2 MG/1.5ML Sopn Generic drug: Semaglutide(0.25 or 0.5MG/DOS) Inject 0.5 mg into the skin once a week.   polyethylene glycol 17 g packet Commonly known as: MiraLax Take 17 g by mouth daily.   potassium chloride 10 MEQ tablet Commonly known as: KLOR-CON Take 10 meq, by mouth, on Monday, Wednesday and Friday only, with torsemide. What changed:   how much to take  how to take this  when to take this  additional instructions   pramipexole 0.75 MG tablet Commonly known as: MIRAPEX Take 1.5 mg by mouth every evening.   torsemide 20 MG tablet Commonly known as:  DEMADEX Take 20 mg tablet, twice daily, by mouth on Monday, Wednesday and Friday only.   Toujeo SoloStar 300 UNIT/ML Sopn Generic drug: Insulin Glargine (1 Unit Dial) Inject 24 Units into the skin at bedtime.      Allergies  Allergen Reactions  . Lisinopril Cough      Procedures/Studies: CT ANGIO HEAD W OR WO CONTRAST  Result Date: 02/06/2019 CLINICAL DATA:  Stroke follow-up EXAM: CT ANGIOGRAPHY HEAD AND NECK TECHNIQUE: Multidetector CT imaging of the head and neck was performed using the standard protocol during bolus administration of intravenous contrast. Multiplanar CT image reconstructions and MIPs were obtained to evaluate the vascular anatomy. Carotid stenosis measurements (when applicable) are obtained utilizing NASCET criteria, using  the distal internal carotid diameter as the denominator. CONTRAST:  75mL OMNIPAQUE IOHEXOL 350 MG/ML SOLN COMPARISON:  Brain MRI from earlier today FINDINGS: CTA NECK FINDINGS Aortic arch: Atherosclerotic calcification. There is CABG which is incompletely covered. Right carotid system: Mild for age atheromatous plaque at the common carotid bifurcation without ulceration or flow limiting stenosis. Left carotid system: Mild for age atheromatous plaque at the bifurcation without flow limiting stenosis or ulceration. Vertebral arteries: Mild atherosclerosis of the bilateral proximal subclavian artery. The left vertebral artery is strongly dominant. No significant vertebral stenosis or beading. Skeleton: No acute or aggressive finding. Other neck: Negative Upper chest: Negative Review of the MIP images confirms the above findings CTA HEAD FINDINGS Anterior circulation: Calcified plaque along the carotid siphons without flow limiting stenosis or branch occlusion. Mild atheromatous irregularity of medium size vessels. No branch occlusion. Negative for aneurysm. Posterior circulation: Strong left vertebral artery dominance. Mild atheromatous narrowing at the left  vertebrobasilar junction. The basilar is diffusely patent. Aplastic or hypoplastic left P1 segment. Robust bilateral PCA flow with mildly asymmetric atheromatous changes at the right P3 4 segment. No right P1 segment irregularity or narrowing to correlate with the acute thalamic lacune. Venous sinuses: Unremarkable in the arterial phase Anatomic variants: As above Review of the MIP images confirms the above findings IMPRESSION: 1. No emergent finding. 2. Overall mild atheromatous changes for age. Electronically Signed   By: Jonathon  Watts M.D.   On: 02/06/2019 11:24   CT HEAD WO CONTRAST  Result Date: 02/06/2019 CLINICAL DATA:  Focal neuro deficit, > 6 hrs, stroke suspected. Left-sided numbness. EXAM: CT HEAD WITHOUT CONTRAST TECHNIQUE: Contiguous axial images were obtained from the base of the skull through the vertex without intravenous contrast. COMPARISON:  None. FINDINGS: Brain: No intracranial hemorrhage, mass effect, or midline shift. Brain volume is normal for age. No hydrocephalus. The basilar cisterns are patent. Small remote lacunar infarct in the left basal ganglia. Minor periventricular white matter hypodensities, nonspecific but typically chronic small vessel ischemia. Possible chronic ischemic change in the right pons. No evidence of territorial infarct or acute ischemia. No extra-axial or intracranial fluid collection. Vascular: Atherosclerosis of skullbase vasculature without hyperdense vessel or abnormal calcification. Skull: No fracture or focal lesion. Sinuses/Orbits: No acute findings. Bilateral cataract resection. Other: None. IMPRESSION: 1. No acute intracranial abnormality. 2. Small remote lacunar infarct in the left basal ganglia. Mild periventricular white matter hypodensity, nonspecific but typically chronic small vessel ischemia. Electronically Signed   By: Melanie  Sanford M.D.   On: 02/06/2019 00:47   CT ANGIO NECK W OR WO CONTRAST  Result Date: 02/06/2019 CLINICAL DATA:   Stroke follow-up EXAM: CT ANGIOGRAPHY HEAD AND NECK TECHNIQUE: Multidetector CT imaging of the head and neck was performed using the standard protocol during bolus administration of intravenous contrast. Multiplanar CT image reconstructions and MIPs were obtained to evaluate the vascular anatomy. Carotid stenosis measurements (when applicable) are obtained utilizing NASCET criteria, using the distal internal carotid diameter as the denominator. CONTRAST:  75mL OMNIPAQUE IOHEXOL 350 MG/ML SOLN COMPARISON:  Brain MRI from earlier today FINDINGS: CTA NECK FINDINGS Aortic arch: Atherosclerotic calcification. There is CABG which is incompletely covered. Right carotid system: Mild for age atheromatous plaque at the common carotid bifurcation without ulceration or flow limiting stenosis. Left carotid system: Mild for age atheromatous plaque at the bifurcation without flow limiting stenosis or ulceration. Vertebral arteries: Mild atherosclerosis of the bilateral proximal subclavian artery. The left vertebral artery is strongly dominant. No significant vertebral stenosis or   beading. Skeleton: No acute or aggressive finding. Other neck: Negative Upper chest: Negative Review of the MIP images confirms the above findings CTA HEAD FINDINGS Anterior circulation: Calcified plaque along the carotid siphons without flow limiting stenosis or branch occlusion. Mild atheromatous irregularity of medium size vessels. No branch occlusion. Negative for aneurysm. Posterior circulation: Strong left vertebral artery dominance. Mild atheromatous narrowing at the left vertebrobasilar junction. The basilar is diffusely patent. Aplastic or hypoplastic left P1 segment. Robust bilateral PCA flow with mildly asymmetric atheromatous changes at the right P3 4 segment. No right P1 segment irregularity or narrowing to correlate with the acute thalamic lacune. Venous sinuses: Unremarkable in the arterial phase Anatomic variants: As above Review of the  MIP images confirms the above findings IMPRESSION: 1. No emergent finding. 2. Overall mild atheromatous changes for age. Electronically Signed   By: Jonathon  Watts M.D.   On: 02/06/2019 11:24   MR BRAIN WO CONTRAST  Result Date: 02/06/2019 CLINICAL DATA:  Left-sided numbness that started yesterday EXAM: MRI HEAD WITHOUT CONTRAST TECHNIQUE: Multiplanar, multiecho pulse sequences of the brain and surrounding structures were obtained without intravenous contrast. COMPARISON:  Head CT from earlier today FINDINGS: Brain: Subcentimeter restricted diffusion in the right thalamus. Subcentimeter weakly restricted diffusion in the right centrum semiovale. There is a punctate diffusion hyperintense focus in the right pons on coronal images that is not definite based on the other sequences. There is mild to moderate FLAIR hyperintensity in the cerebral white matter with areas of chronic lacune is in the bilateral centrum semiovale and left frontal white matter. Few remote microhemorrhages with nonspecific pattern given limited extent. No acute hemorrhage, hydrocephalus, or mass. Vascular: Normal flow voids Skull and upper cervical spine: Normal marrow signal Sinuses/Orbits: Bilateral cataract resection. IMPRESSION: 1. Acute lacunar infarct in the right thalamus. 2. Subacute lacunar infarct in the right centrum semiovale. 3. Chronic small vessel ischemia with remote lacunar infarcts. Electronically Signed   By: Jonathon  Watts M.D.   On: 02/06/2019 07:49   DG Chest Port 1 View  Result Date: 02/06/2019 CLINICAL DATA:  Left-sided numbness. EXAM: PORTABLE CHEST 1 VIEW COMPARISON:  December 20, 2018 FINDINGS: The heart size remains enlarged. There is persistent consolidation at the left lung base. There is some elevation of the left hemidiaphragm. There are probable small bilateral pleural effusions. The patient is status post prior median sternotomy. There is no pneumothorax. There is vascular congestion. There is no  acute osseous abnormality. IMPRESSION: 1. Persistent left basilar consolidation and probable small bilateral pleural effusions. 2. Cardiomegaly with vascular congestion. 3. Low lung volumes. Electronically Signed   By: Christopher  Green M.D.   On: 02/06/2019 00:30   ECHOCARDIOGRAM COMPLETE  Result Date: 02/06/2019   ECHOCARDIOGRAM REPORT   Patient Name:   Shirley W Lembke Date of Exam: 02/06/2019 Medical Rec #:  [id number]     Height:       69.0 in Accession #:    [id number]    Weight:       242.5 lb Date of Birth:  04/22/1945     BSA:          2.24 m Patient Age:    73 years      BP:           151/82 mmHg Patient Gender: M             HR:           74 bpm. Exam Location:  Inpatient Procedure: 2D Echo Indications:      stroke 434.91  History:        Patient has prior history of Echocardiogram examinations, most                 recent 06/24/2017. CHF, CAD, chronic kidney disease.; Risk                 Factors:Diabetes, Hypertension and Dyslipidemia.  Sonographer:    Johny Chess Referring Phys: Dulce  1. Left ventricular ejection fraction, by visual estimation, is 40 to 45%. The left ventricle has mild to moderately decreased function. There is mildly increased left ventricular hypertrophy.  2. Mild to moderately dilated left ventricular internal cavity size.  3. The left ventricle demonstrates global hypokinesis.  4. Global right ventricle was not well visualized.The right ventricular size is not well visualized. Right vetricular wall thickness was not assessed.  5. Left atrial size was mildly dilated.  6. Right atrial size was normal.  7. Trivial pericardial effusion is present.  8. The mitral valve is grossly normal. Trivial mitral valve regurgitation.  9. The tricuspid valve is grossly normal. Tricuspid valve regurgitation is trivial. 10. The aortic valve is tricuspid. Aortic valve regurgitation is not visualized. Mild aortic valve sclerosis without stenosis. 11. Pulmonic  regurgitation is mild. 12. The pulmonic valve was grossly normal. Pulmonic valve regurgitation is mild. 13. The inferior vena cava is dilated in size with >50% respiratory variability, suggesting right atrial pressure of 8 mmHg. 14. No significant change from prior study. 15. The atrial septum is grossly normal. FINDINGS  Left Ventricle: Left ventricular ejection fraction, by visual estimation, is 40 to 45%. The left ventricle has mild to moderately decreased function. The left ventricle demonstrates global hypokinesis. The left ventricular internal cavity size was mildly to moderately dilated left ventricle. There is mildly increased left ventricular hypertrophy. Concentric left ventricular hypertrophy. Right Ventricle: The right ventricular size is not well visualized. Right vetricular wall thickness was not assessed. Global RV systolic function is was not well visualized. Left Atrium: Left atrial size was mildly dilated. Right Atrium: Right atrial size was normal in size Pericardium: Trivial pericardial effusion is present. Mitral Valve: The mitral valve is grossly normal. Trivial mitral valve regurgitation. Tricuspid Valve: The tricuspid valve is grossly normal. Tricuspid valve regurgitation is trivial. Aortic Valve: The aortic valve is tricuspid. Aortic valve regurgitation is not visualized. Mild aortic valve sclerosis is present, with no evidence of aortic valve stenosis. Pulmonic Valve: The pulmonic valve was grossly normal. Pulmonic valve regurgitation is mild. Pulmonic regurgitation is mild. Aorta: The aortic root, ascending aorta and aortic arch are all structurally normal, with no evidence of dilitation or obstruction. Venous: The inferior vena cava is dilated in size with greater than 50% respiratory variability, suggesting right atrial pressure of 8 mmHg. IAS/Shunts: The atrial septum is grossly normal.  LEFT VENTRICLE PLAX 2D LVIDd:         5.50 cm  Diastology LVIDs:         4.30 cm  LV e' lateral:    9.46 cm/s LV PW:         1.50 cm  LV E/e' lateral: 7.5 LV IVS:        1.20 cm  LV e' medial:    8.38 cm/s LVOT diam:     2.50 cm  LV E/e' medial:  8.4 LV SV:         64 ml LV SV Index:   27.31 LVOT Area:     4.91 cm  RIGHT VENTRICLE RV S prime:     11.90 cm/s TAPSE (M-mode): 1.6 cm LEFT ATRIUM           Index       RIGHT ATRIUM           Index LA diam:      4.70 cm 2.10 cm/m  RA Area:     18.50 cm LA Vol (A2C): 42.7 ml 19.04 ml/m RA Volume:   45.20 ml  20.16 ml/m LA Vol (A4C): 72.0 ml 32.11 ml/m  AORTIC VALVE LVOT Vmax:   81.60 cm/s LVOT Vmean:  52.600 cm/s LVOT VTI:    0.159 m MITRAL VALVE MV Area (PHT): 3.85 cm             SHUNTS MV PHT:        57.13 msec           Systemic VTI:  0.16 m MV Decel Time: 197 msec             Systemic Diam: 2.50 cm MV E velocity: 70.70 cm/s 103 cm/s MV A velocity: 91.30 cm/s 70.3 cm/s MV E/A ratio:  0.77       1.5  Bridgette Christopher MD Electronically signed by Bridgette Christopher MD Signature Date/Time: 02/06/2019/4:47:17 PM    Final       Subjective: Minimal numbness at left fingertips but otherwise denied complaints and anxious to go home.  He is anxious to return to his 9-year-old grandson at home he recently got a dog.  No chest pain, dyspnea, dizziness or lightheadedness.  As per RN, ambulated comfortably in the halls without distress.  Discharge Exam:  Vitals:   02/06/19 2325 02/07/19 0433 02/07/19 0804 02/07/19 1222  BP: 138/86 (!) 162/88 132/78 (!) 165/85  Pulse: 77 77 63 89  Resp: 18 18 16 16  Temp: 98.4 F (36.9 C) 98 F (36.7 C) 98 F (36.7 C) 98 F (36.7 C)  TempSrc: Oral Oral Oral Oral  SpO2: 98% 97% 99% 96%  Weight:      Height:        General: Pleasant elderly male, moderately built and obese sitting up comfortably in chair this morning. Cardiovascular: S1 & S2 heard, RRR, S1/S2 +. No murmurs, rubs, gallops or clicks. No JVD or pedal edema.  Telemetry personally reviewed: Sinus rhythm with few bigemini and trigeminy. Respiratory:  Clear to auscultation without wheezing, rhonchi or crackles. No increased work of breathing. Abdominal:  Non distended, non tender & soft. No organomegaly or masses appreciated. Normal bowel sounds heard. CNS: Alert and oriented. No focal deficits. Extremities: no edema, no cyanosis    The results of significant diagnostics from this hospitalization (including imaging, microbiology, ancillary and laboratory) are listed below for reference.     Microbiology: Recent Results (from the past 240 hour(s))  SARS CORONAVIRUS 2 (TAT 6-24 HRS) Nasopharyngeal Nasopharyngeal Swab     Status: None   Collection Time: 02/06/19  5:46 AM   Specimen: Nasopharyngeal Swab  Result Value Ref Range Status   SARS Coronavirus 2 NEGATIVE NEGATIVE Final    Comment: (NOTE) SARS-CoV-2 target nucleic acids are NOT DETECTED. The SARS-CoV-2 RNA is generally detectable in upper and lower respiratory specimens during the acute phase of infection. Negative results do not preclude SARS-CoV-2 infection, do not rule out co-infections with other pathogens, and should not be used as the sole basis for treatment or other patient management decisions. Negative results must be combined with clinical observations, patient history, and epidemiological information. The   expected result is Negative. Fact Sheet for Patients: https://www.fda.gov/media/138098/download Fact Sheet for Healthcare Providers: https://www.fda.gov/media/138095/download This test is not yet approved or cleared by the [place] FDA and  has been authorized for detection and/or diagnosis of SARS-CoV-2 by FDA under an Emergency Use Authorization (EUA). This EUA will remain  in effect (meaning this test can be used) for the duration of the COVID-19 declaration under Section 56 4(b)(1) of the Act, 21 U.S.C. section 360bbb-3(b)(1), unless the authorization is terminated or revoked sooner. Performed at [facility], [address] [place],  [place] [address]      Labs: CBC: Recent Labs  Lab 02/05/19 2310 02/06/19 0043  WBC 7.8  --   NEUTROABS 5.2  --   HGB 15.2 15.3  HCT 45.5 45.0  MCV 86.8  --   PLT 217  --     Basic Metabolic Panel: Recent Labs  Lab 02/05/19 2310 02/06/19 0043  NA 138 139  K 3.9 3.9  CL 106 101  CO2 27  --   GLUCOSE 208* 260*  BUN 16 18  CREATININE 1.37* 1.20  CALCIUM 8.4*  --     Liver Function Tests: Recent Labs  Lab 02/05/19 2310  AST 16  ALT 14  ALKPHOS 63  BILITOT 0.6  PROT 6.5  ALBUMIN 3.3*    CBG: Recent Labs  Lab 02/06/19 1124 02/06/19 1636 02/06/19 2122 02/07/19 0546 02/07/19 1100  GLUCAP 160* 217* 170* 188* 213*    Hgb A1c Recent Labs    02/06/19 0922  HGBA1C 12.9*    Lipid Profile Recent Labs    02/06/19 0922  CHOL 209*  HDL 42  LDLCALC 140*  TRIG 137  CHOLHDL 5.0     Urinalysis    Component Value Date/Time   COLORURINE STRAW (A) 02/06/2019 0007   APPEARANCEUR CLEAR 02/06/2019 0007   LABSPEC 1.013 02/06/2019 0007   PHURINE 6.0 02/06/2019 0007   GLUCOSEU >=500 (A) 02/06/2019 0007   HGBUR SMALL (A) 02/06/2019 0007   BILIRUBINUR NEGATIVE 02/06/2019 0007   KETONESUR NEGATIVE 02/06/2019 0007   PROTEINUR 100 (A) 02/06/2019 0007   UROBILINOGEN 1.0 10/24/2011 1742   NITRITE NEGATIVE 02/06/2019 0007   LEUKOCYTESUR NEGATIVE 02/06/2019 0007    I discussed in detail with patient's daughter, updated care and answered questions.  Time coordinating discharge: 35 minutes  SIGNED:  Anand Hongalgi, MD, FACP, SFHM. [facility]  To contact the attending provider between 7A-7P or the covering provider during after hours 7P-7A, please log into the web site [email or web address] and access using universal [place] password for that web site. If you do not have the password, please call the hospital operator.  

## 2019-02-07 NOTE — Progress Notes (Signed)
Physical Therapy Treatment Patient Details Name: James [name] MRN: [id number] DOB: January 04, 1946 Today's Date: 02/07/2019    History of Present Illness James [name] is a 73 y.o. male with medical history significant of HTN; HLD; DM; CAD s/p CABG; stage 2 CKD; chronic combined CHF; RLS; and AAA presenting with left arm and hand numbness. MRI scan shows right thalamic lacunar infarct as well as tiny right basal ganglia as well as a questionable right pontine lacunar infarct as well.    PT Comments    Continuing work on functional mobility and activity tolerance;  Notably better gait which improved with practice; Still, continue to recommend Outpt PT to work on gait and balance and give education on exercise and activity in the home -- to work on increasing physical activity , a modifiable risk factor for CVA   Follow Up Recommendations  Outpatient PT(for gait and balance; and education on initiating exercise)     Equipment Recommendations  None recommended by PT    Recommendations for Other Services       Precautions / Restrictions Precautions Precautions: Fall Precaution Comments: Fall risk present, but minimal Restrictions Weight Bearing Restrictions: No    Mobility  Bed Mobility                  Transfers Overall transfer level: Needs assistance Equipment used: None Transfers: Sit to/from Stand Sit to Stand: Min guard(without physical contact )         General transfer comment: Slow rise, but not needing physical assist  Ambulation/Gait Ambulation/Gait assistance: Min guard;Supervision Gait Distance (Feet): 250 Feet Assistive device: None Gait Pattern/deviations: Step-through pattern;Decreased stride length;Decreased dorsiflexion - right;Decreased dorsiflexion - left;Shuffle Gait velocity: decreased   General Gait Details: Min guard initially for stability, tendency for downward gaze, shuffling gait pattern; Progressed from minguard without physical contact  to Supervision; Cues to self-monitor for activity tolerance; Gait pattern stabilized to moderately better with more distance and practice   Stairs             Wheelchair Mobility    Modified Rankin (Stroke Patients Only) Modified Rankin (Stroke Patients Only) Pre-Morbid Rankin Score: No significant disability Modified Rankin: Moderate disability     Balance Overall balance assessment: Mild deficits observed, not formally tested                                          Cognition Arousal/Alertness: Awake/alert Behavior During Therapy: WFL for tasks assessed/performed Overall Cognitive Status: Within Functional Limits for tasks assessed                                        Exercises      General Comments General comments (skin integrity, edema, etc.): Educated/reviewed signs and symptoms of CVA and risk factors, including modifiable risk factors      Pertinent Vitals/Pain Pain Assessment: No/denies pain    Home Living                      Prior Function            PT Goals (current goals can now be found in the care plan section) Acute Rehab PT Goals Patient Stated Goal: "play with my grandson." PT Goal Formulation: With patient Time For Goal Achievement: 02/20/19  Potential to Achieve Goals: Good Progress towards PT goals: Progressing toward goals    Frequency    Min 3X/week      PT Plan Current plan remains appropriate    Co-evaluation              AM-PAC PT "6 Clicks" Mobility   Outcome Measure  Help needed turning from your back to your side while in a flat bed without using bedrails?: None Help needed moving from lying on your back to sitting on the side of a flat bed without using bedrails?: None Help needed moving to and from a bed to a chair (including a wheelchair)?: None Help needed standing up from a chair using your arms (e.g., wheelchair or bedside chair)?: None Help needed to walk in  hospital room?: None Help needed climbing 3-5 steps with a railing? : A Little 6 Click Score: 23    End of Session Equipment Utilized During Treatment: Gait belt Activity Tolerance: Patient tolerated treatment well Patient left: in chair;with call bell/phone within reach Nurse Communication: Mobility status PT Visit Diagnosis: Unsteadiness on feet (R26.81);History of falling (Z91.81)     Time: 1610-9604 PT Time Calculation (min) (ACUTE ONLY): 13 min  Charges:  $Gait Training: 8-22 mins                     Van Clines, [place]  Acute Rehabilitation Services Pager [phone] Office [phone]    Levi Aland 02/07/2019, 10:16 AM

## 2019-02-07 NOTE — Discharge Instructions (Signed)

## 2019-02-07 NOTE — TOC Initial Note (Signed)
Transition of Care [facility]) - Initial/Assessment Note    Patient Details  Name: James [name] MRN: [id number] Date of Birth: 02-01-1946  [facility]) CM/SW Contact:    Kingsley Plan, RN Phone Number: 02/07/2019, 1:46 PM  Clinical Narrative:                 Patient from home, recommended outpatient PT. Provided list of [place] OP PT Centers. Patient would like [facility] OP PT in [place], referral entered in Epic.  Expected Discharge Plan: Home/Self Care Barriers to Discharge: Continued Medical Work up   Patient Goals and CMS Choice Patient states their goals for this hospitalization and ongoing recovery are:: to return to home CMS Medicare.gov Compare Post Acute Care list provided to:: Patient Choice offered to / list presented to : Patient  Expected Discharge Plan and Services Expected Discharge Plan: Home/Self Care   Discharge Planning Services: CM Consult   Living arrangements for the past 2 months: Single Family Home                 DME Arranged: N/A         HH Arranged: NA          Prior Living Arrangements/Services Living arrangements for the past 2 months: Single Family Home Lives with:: Self Patient language and need for interpreter reviewed:: Yes Do you feel safe going back to the place where you live?: Yes      Need for Family Participation in Patient Care: Yes (Comment) Care giver support system in place?: Yes (comment)   Criminal Activity/Legal Involvement Pertinent to Current Situation/Hospitalization: No - Comment as needed  Activities of Daily Living Home Assistive Devices/Equipment: Other (Comment)("wooden stick used occassionally") ADL Screening (condition at time of admission) Patient's cognitive ability adequate to safely complete daily activities?: Yes Is the patient deaf or have difficulty hearing?: Yes Does the patient have difficulty seeing, even when wearing glasses/contacts?: No Does the patient have difficulty  concentrating, remembering, or making decisions?: No Patient able to express need for assistance with ADLs?: Yes Does the patient have difficulty dressing or bathing?: Yes Independently performs ADLs?: Yes (appropriate for developmental age) Does the patient have difficulty walking or climbing stairs?: Yes Weakness of Legs: Both Weakness of Arms/Hands: None  Permission Sought/Granted   Permission granted to share information with : No              Emotional Assessment Appearance:: Appears stated age Attitude/Demeanor/Rapport: Engaged Affect (typically observed): Accepting Orientation: : Oriented to Self, Oriented to Place, Oriented to  Time, Oriented to Situation Alcohol / Substance Use: Not Applicable Psych Involvement: No (comment)  Admission diagnosis:  Acute lacunar infarction (HCC) [I63.81] Left sided numbness [R20.0] Heart failure (HCC) [I50.9] Patient Active Problem List   Diagnosis Date Noted  . Left sided numbness 02/06/2019  . Acute lacunar infarction (HCC) 02/06/2019  . Obesity, Class II, BMI 35-39.9 02/06/2019  . Dyslipidemia   . Ischemic cardiomyopathy 10/12/2017  . Mild pulmonary hypertension (HCC) 10/12/2017  . S/P CABG (coronary artery bypass graft) 10/01/2017  . Status post coronary artery stent placement   . Left ventricular systolic dysfunction 09/30/2017  . Diverticulitis of sigmoid colon 07/24/2017  . Chronic systolic CHF (congestive heart failure) (HCC) 07/24/2017  . CKD (chronic kidney disease), stage III 07/24/2017  . Pleural thickening 07/24/2017  . Aortic atherosclerosis (HCC) 07/07/2017  . Abnormal nuclear cardiac imaging test 11/14/2013  . Abnormal EKG 03/09/2013  . Dilated aortic root (HCC)   . Edema  02/09/2013  . Hypertension 10/24/2011  . Coronary artery disease 10/24/2011  . Hyperlipidemia 10/24/2011  . DM (diabetes mellitus), type 2, uncontrolled with complications ([place]) 33/00/7622  . GERD (gastroesophageal reflux disease) 10/24/2011    PCP:  Orpah Melter, MD Pharmacy:   [facility], [place] [address] [place] [address] Phone: [phone] Fax: [phone]  [facility] [place] [place] - [address] [place] [address] Phone: [phone] Fax: [phone]     Social Determinants of Health (SDOH) Interventions    Readmission Risk Interventions No flowsheet data found.

## 2019-02-07 NOTE — Progress Notes (Signed)
Asked to evaluate 2D echo done this hospitalization for acute CVA.  Patient had 2D echo 06/2017 that was a poor study due to poor acoustical windows.  EF was 40-45% with possible anteroseptal HK that he has at baseline.  2D echo this admit showed stable EF at 40-45% with mild diffuse hypokinesis.  I reviewed the echoes side by side and feel there is no change in LVF.  Per request of Dr. Leonie Man, will order outpt event monitor 30 day for CVA and also home sleep study.  Will need followup with me or extender after his heart monitor.

## 2019-02-08 ENCOUNTER — Telehealth: Payer: Self-pay | Admitting: *Deleted

## 2019-02-08 ENCOUNTER — Other Ambulatory Visit: Payer: Self-pay | Admitting: Cardiology

## 2019-02-08 ENCOUNTER — Other Ambulatory Visit: Payer: Self-pay | Admitting: *Deleted

## 2019-02-08 ENCOUNTER — Other Ambulatory Visit: Payer: Self-pay

## 2019-02-08 DIAGNOSIS — I10 Essential (primary) hypertension: Secondary | ICD-10-CM

## 2019-02-08 DIAGNOSIS — R0683 Snoring: Secondary | ICD-10-CM

## 2019-02-08 DIAGNOSIS — E669 Obesity, unspecified: Secondary | ICD-10-CM

## 2019-02-08 DIAGNOSIS — I5022 Chronic systolic (congestive) heart failure: Secondary | ICD-10-CM

## 2019-02-08 DIAGNOSIS — I272 Pulmonary hypertension, unspecified: Secondary | ICD-10-CM

## 2019-02-08 NOTE — Telephone Encounter (Signed)
[employer] to ship a 30 day cardiac event monitor to the patients home.  Instructions included in the monitor kit,

## 2019-02-08 NOTE — Patient Outreach (Signed)
[facility]) Care Management  02/08/2019  James [name] Oct 19, 1945 [id number]     Transition of Care Referral  Referral Date: 02/08/2019 Referral Source: [place] Date of Admission: 02/05/2019 Diagnosis: 'acute stroke" Date of Discharge: 02/07/2019 Facility: [place] Medicare PCP Office Does [facility]   Outreach attempt #1 to patient. Spoke with patient briefly due to bad phone connection and having difficulty hearing patient. He denies any acute issues or concerns at present. He voices that he has "almost all" his meds in the home and reports there are few more that he still needs to pick up. He denies any issues affording and/or managing meds at this time. He states that he has several follow MD appts and has made some but still needs to make a few. He denies any issues with transportation. Patient agreeable to RN CM calling back at another time due to inability to hear him clearly.    Plan: RN CM will make outreach attempt to patient within 3-4 business days.  Enzo Montgomery, RN,BSN,CCM [place] Management Telephonic Care Management Coordinator Direct Phone: [phone] Toll Free: [phone] Fax: [phone]

## 2019-02-08 NOTE — Telephone Encounter (Signed)
PA for sleep study sent to [facility].

## 2019-02-08 NOTE — Telephone Encounter (Addendum)
From: Antony Madura, PA-C Sent: 02/07/2019   3:19 PM EST To: Cv Div Nl Scheduling Subject: Heart Monitor and Sleep study                  Good afternoon,  This patient is being discharged today for stroke. Dr. Radford Pax wants a 30 day heart monitor and an outpatient sleep study. I put in the orders for both. Can someone please call the patient to arrange the heart monitor as well as the sleep study. He should also get into be seen some time 4 weeks after monitor is completed (can be virtual). Let me know if I need to adjust any of the orders.   Thanks! Cadence

## 2019-02-08 NOTE — Consult Note (Signed)
   [facility] Inpatient Consult   02/08/2019  James [name] [facility] 08-02-1945 [id number]    Patientreviewedfor15%risk score for unplanned readmission and hospitalization under his [place]; and to check for potential [facility] care management services needed.  Patient had been outreached by [place] RN CM in the past but was unable to maintain contact.   Chart reviewed andMD brief summary on 02/07/19 reveals as follows: 73 year old male lives alone, ambulates with the help of cane, PMH of poorly controlled type II DM/IDDM, HTN, HLD, CAD s/p CABG, stage III CKD, chronic combined CHF, RLS, AAA, postop atrial flutter     presented with left-sided numbness and was admitted for acute stroke. Neurology consulted. [Poorly controlled type II DM/IDDM with hyperglycemia with A1c= 12.9, goal <7. He is seen by inpatient diabetes coordinator during this admission (verified his regimen with his endocrinologist office). Patient reportedly has not been taking all of his medications as he was supposed to].    Patient transitioned to home yesterday, and was able to speakto him over the phone briefly today. HIPAA (2) identifiers verified.  Explained to patient aboutthe call and [facility] care management services very briefly and mentioned about his poor controlledDM(recent A1C of 12.9).    Patient voiced not wanting to stay on the phone long because breakfast will get cold but he verbally agreed with services to help control and manage chronic health issues- mainly DM. Patient reports that he has a scheduled appointment for follow-up with his endocrinologist on 02/16/19 at 2:30 pm. He has not indicated current barriers with medications/ pharmacy and transportation at this time and he lives at home alone.   Patient agreed with follow-up calls from [place] (care management coordinator).  Patient'sprimary care provider as Dr. Orpah Melter with [facility], listed as providing  transitionof care follow-up.   Referral made to THNRNcaremanagementcoordinator for complexcare and disease management of poor controlled DM and assessforfurther needs post discharge.   For questions and additional information, please call:  Pristine Gladhill A. Will Schier, BSN, RN-BC [facility] Liaison Cell: [phone]

## 2019-02-09 ENCOUNTER — Other Ambulatory Visit: Payer: Self-pay

## 2019-02-09 ENCOUNTER — Telehealth: Payer: Self-pay | Admitting: *Deleted

## 2019-02-09 NOTE — Patient Outreach (Signed)
[facility]) Care Management  02/09/2019  James [name] 1946-01-18 [id number]   Transition of Care Referral  Referral Date: 02/08/2019 Referral Source: [place] Date of Admission: 02/05/2019 Diagnosis: 'acute stroke" Date of Discharge: 02/07/2019 Facility: [place] Medicare PCP Office Does [facility]    Outreach attempt #2 to patient. Line busy after multiple attempts.    Plan: RN CM will make outreach attempt to patient within 3-4 business days.   Enzo Montgomery, RN,BSN,CCM [place] Management Telephonic Care Management Coordinator Direct Phone: [phone] Toll Free: [phone] Fax: [phone]

## 2019-02-09 NOTE — Telephone Encounter (Signed)
Staff message sent to Gae Bon ok to schedule sleep study. [employer] Auth received. Auth # [id number]. Valid dates 02/16/19 to 03/18/19.

## 2019-02-14 ENCOUNTER — Other Ambulatory Visit: Payer: Self-pay

## 2019-02-14 NOTE — Patient Outreach (Signed)
[facility]) Care Management  02/14/2019  Hiroto Saltzman [facility] August 18, 1945 [id number]   Transition of Care Referral  Referral Date:02/08/2019 Referral [place] Date of Admission:02/05/2019 Diagnosis:'acute stroke" Date of Discharge:02/07/2019 Facility:[place] Insurance:[employer] Medicare PCP Office Does [facility]   Outreach attempt #3 to patient. No answer at present.    Plan: RN CM will close case if no response from letter mailed to patient.   Enzo Montgomery, RN,BSN,CCM [place] Management Telephonic Care Management Coordinator Direct Phone: [phone] Toll Free: [phone] Fax: [phone]

## 2019-02-15 MED FILL — metFORMIN HCL ER 500 MG TB2: 500 | 30 days supply | Qty: 30 | Fill #2

## 2019-02-16 DIAGNOSIS — I130 Hypertensive heart and chronic kidney disease with heart failure and stage 1 through stage 4 chronic kidney disease, or unspecified chronic kidney disease: Secondary | ICD-10-CM | POA: Diagnosis not present

## 2019-02-16 DIAGNOSIS — E1165 Type 2 diabetes mellitus with hyperglycemia: Secondary | ICD-10-CM | POA: Diagnosis not present

## 2019-02-16 DIAGNOSIS — E119 Type 2 diabetes mellitus without complications: Secondary | ICD-10-CM | POA: Diagnosis not present

## 2019-02-16 DIAGNOSIS — I10 Essential (primary) hypertension: Secondary | ICD-10-CM | POA: Diagnosis not present

## 2019-02-16 DIAGNOSIS — Z09 Encounter for follow-up examination after completed treatment for conditions other than malignant neoplasm: Secondary | ICD-10-CM | POA: Diagnosis not present

## 2019-02-16 DIAGNOSIS — I5022 Chronic systolic (congestive) heart failure: Secondary | ICD-10-CM | POA: Diagnosis not present

## 2019-02-16 DIAGNOSIS — E11319 Type 2 diabetes mellitus with unspecified diabetic retinopathy without macular edema: Secondary | ICD-10-CM | POA: Diagnosis not present

## 2019-02-16 DIAGNOSIS — E782 Mixed hyperlipidemia: Secondary | ICD-10-CM | POA: Diagnosis not present

## 2019-02-16 DIAGNOSIS — I25810 Atherosclerosis of coronary artery bypass graft(s) without angina pectoris: Secondary | ICD-10-CM | POA: Diagnosis not present

## 2019-02-16 DIAGNOSIS — E1121 Type 2 diabetes mellitus with diabetic nephropathy: Secondary | ICD-10-CM | POA: Diagnosis not present

## 2019-02-16 DIAGNOSIS — Z794 Long term (current) use of insulin: Secondary | ICD-10-CM | POA: Diagnosis not present

## 2019-02-16 DIAGNOSIS — I25118 Atherosclerotic heart disease of native coronary artery with other forms of angina pectoris: Secondary | ICD-10-CM | POA: Diagnosis not present

## 2019-02-16 DIAGNOSIS — N183 Chronic kidney disease, stage 3 unspecified: Secondary | ICD-10-CM | POA: Diagnosis not present

## 2019-02-16 MED FILL — FREESTYLE LIBRE 14 DAY SENS: 28 days supply | Qty: 2 | Fill #0

## 2019-02-16 MED FILL — GLIMEPIRIDE 4 MG TABLET: 4 | 30 days supply | Qty: 30 | Fill #0

## 2019-02-16 MED FILL — FREESTYLE LIBRE 14 DAY READ: 30 days supply | Qty: 1 | Fill #0

## 2019-02-17 ENCOUNTER — Telehealth: Payer: Self-pay | Admitting: *Deleted

## 2019-02-17 NOTE — Telephone Encounter (Signed)
Patient is scheduled for lab study on 03/06/19. PT is scheduled for COVID screening on 03/03/19 2:45 prior to titration.   Patient understands his sleep study will be done at [facility] sleep lab. Patient understands he will receive a sleep packet in a week or so. Patient understands to call if he does not receive the sleep packet in a timely manner.  Left detailed message on voicemail with date and time of titration and informed patient to call back to confirm or reschedule.

## 2019-02-17 NOTE — Telephone Encounter (Signed)
-----   Message from Lauralee Evener, [place] sent at 02/09/2019  1:06 PM EST ----- Regarding: RE: [place] [employer] received. Ok to schedule.  Josem Kaufmann #[id number]. Valid dates 02/16/19 to 03/18/19. ----- Message ----- From: Freada Bergeron, CMA Sent: 02/08/2019   5:42 PM EST To: Cv Div Sleep Studies Subject: precert                                        Your physician has recommended that you have a sleep study. Split night

## 2019-02-21 ENCOUNTER — Other Ambulatory Visit: Payer: Self-pay

## 2019-02-21 NOTE — Patient Outreach (Signed)
[facility]) Care Management  02/21/2019  James [name] [facility] 03-05-1945 [id number]   Transition of Care Referral  Referral Date:02/08/2019 [facility] Source[facility] Date of Admission:02/05/2019 Diagnosis:'acute stroke" Date of Discharge:02/07/2019 Facility:[place] Insurance:[employer] Medicare PCP Office Does TOC   Multiple attempts to establish contact with patient without success. No response from letter mailed to patient. Case is being closed at this time.   Plan: RN CM will close case at this time.  Antionette Fairy, RN,BSN,CCM [facility] Care Management Telephonic Care Management Coordinator Direct Phone: [phone] Toll Free: [phone] Fax: [phone]

## 2019-02-22 ENCOUNTER — Other Ambulatory Visit: Payer: Self-pay

## 2019-02-22 ENCOUNTER — Encounter: Payer: Self-pay | Admitting: Physical Therapy

## 2019-02-22 ENCOUNTER — Ambulatory Visit (INDEPENDENT_AMBULATORY_CARE_PROVIDER_SITE_OTHER): Payer: Medicare HMO | Admitting: Physical Therapy

## 2019-02-22 DIAGNOSIS — M25512 Pain in left shoulder: Secondary | ICD-10-CM | POA: Diagnosis not present

## 2019-02-22 DIAGNOSIS — R2689 Other abnormalities of gait and mobility: Secondary | ICD-10-CM | POA: Diagnosis not present

## 2019-02-22 DIAGNOSIS — M25612 Stiffness of left shoulder, not elsewhere classified: Secondary | ICD-10-CM | POA: Diagnosis not present

## 2019-02-22 DIAGNOSIS — R2681 Unsteadiness on feet: Secondary | ICD-10-CM

## 2019-02-22 NOTE — Therapy (Signed)
[facility] [address] [place], [place], [address] Phone: [phone]   Fax:  [phone]  Physical Therapy Evaluation  Patient Details  Name: James [name] MRN: [id number] Date of Birth: 1946-01-07 Referring Provider (PT): Marcellus ScottAnand Hongalgi, MD   Encounter Date: 02/22/2019  PT End of Session - 02/22/19 1100    Visit Number  1    Number of Visits  12    Date for PT Re-Evaluation  04/05/19    Authorization Type  [employer] MCR    PT Start Time  1100    PT Stop Time  1146    PT Time Calculation (min)  46 min    Activity Tolerance  Patient tolerated treatment well    Behavior During Therapy  [facility] for tasks assessed/performed       Past Medical History:  Diagnosis Date  . Abnormal EKG 03/09/2013  . Abnormal nuclear cardiac imaging test 11/14/2013  . Acute lacunar infarction (HCC) 02/06/2019  . Aortic atherosclerosis (HCC) 07/07/2017  . Arthritis    "all over" (09/30/2017)  . Ascending aortic aneurysm (HCC)    cMRI (2/15): Normal LV EF 54%, Mild BAE, Trileaflet Aortic Valve, Upper limits of normal ascending aorta 3.8 cm, Normal aortic arch 2.3 cm with bovine origin of left carotid  . Atrial flutter (HCC)    Post-op with no reoccurence  . Chronic diastolic heart failure (HCC)    Echo (1/15): Left ventricle: The cavity size was mildly dilated. Mild LVH. EF 55% to 60%. Wall motion was normal; Gr 1 diastolic dysfunction Left atrium: The atrium was mildly dilated. Right atrium: The atrium was mildly to moderately dilated. Aortic arch measures 4.8 cm (moderately dilated); suggest CTA or MRA to better assess.  . Chronic systolic CHF (congestive heart failure) (HCC) 07/24/2017  . CKD (chronic kidney disease), stage III 07/24/2017  . Coronary artery disease 10/2011   a. s/p cabg;  b. Myoview (9/15):  ant ischemia, poss TID, EF 43%, high risk >> LHC (9/15): Dist LM 50, pLAD 90 then 100, pCFX 50, mCFX 75, oRCA 75, dRCA 95, L-LAD patent, S-Dx  100, S-OM1 patent, S-PDA patent, EF 55% >> native Dx small caliber and not well suited for PCI; native RCA amenable to PCI and would restore flow to PLA branches but would jeopardize SVG-RCA - Med Rx  . Deviated nasal septum   . Dilated aortic root (HCC)   . Diverticulitis of sigmoid colon 07/24/2017  . DM (diabetes mellitus), type 2, uncontrolled with complications (HCC)    onset 2013  . Dyslipidemia   . Edema 02/09/2013  . GERD (gastroesophageal reflux disease)   . Hx of Doppler ultrasound    Carotid US (9/13): No ICA stenosis  . Hypertension   . Pleural thickening 07/24/2017    Past Surgical History:  Procedure Laterality Date  . CORONARY ARTERY BYPASS GRAFT  10/27/2011   Procedure: CORONARY ARTERY BYPASS GRAFTING (CABG);  Surgeon: Kerin PernaPeter Van Trigt, MD;  [facility];  Service: Open Heart Surgery;  Laterality: N/A;  Coronary Artery Bypass Grafting times four using left internal mammary artery and right greater saphenous vein endoscopically harvested  . CORONARY STENT INTERVENTION N/A 09/30/2017   Procedure: CORONARY STENT INTERVENTION;  Surgeon: Corky CraftsVaranasi, Jayadeep S, MD;  [facility];  Service: Cardiovascular;  Laterality: N/A;  . KNEE ARTHROSCOPY Left 2003  . KNEE ARTHROSCOPY Right 2006  . LEFT HEART CATHETERIZATION WITH CORONARY ANGIOGRAM N/A 10/23/2011   Procedure: LEFT HEART CATHETERIZATION WITH CORONARY  ANGIOGRAM;  Surgeon: Quintella Reichert, MD;  Location: [facility] CATH LAB;  Service: Cardiovascular;  Laterality: N/A;  . LEFT HEART CATHETERIZATION WITH CORONARY ANGIOGRAM N/A 11/16/2013   Procedure: LEFT HEART CATHETERIZATION WITH CORONARY ANGIOGRAM;  Surgeon: Micheline Chapman, MD;  Location: [facility] CATH LAB;  Service: Cardiovascular;  Laterality: N/A;  . RIGHT/LEFT HEART CATH AND CORONARY/GRAFT ANGIOGRAPHY N/A 09/30/2017   Procedure: RIGHT/LEFT HEART CATH AND CORONARY/GRAFT ANGIOGRAPHY;  Surgeon: Corky Crafts, MD;  Location[facility] INV[facility] CV LAB;  Service: Cardiovascular;   Laterality: N/A;    There were no vitals filed for this visit.   Subjective Assessment - 02/22/19 1103    Subjective  In November, pt tried to lift dog with left arm and his shoulder bruised anteriorly and he has had pain ever since. He has pain reaching out and overhead. He says it is more stiff this week compared to last week.  In December, he felt numbness in his left arm and leg and was dx with lacunar infarct.  He still has tingling mainly in the arm from fingers to top of shoulder and also in his left leg.  He denies weakness. He says he usually uses a cane, but felt steady today.    Pertinent History  TIA December 2020, CAD, DM, HTN, arthritis, bypass surgery 2013, bil knee arthroscopies    Limitations  Lifting    Patient Stated Goals  less pain in shoulder    Currently in Pain?  Yes    Pain Score  6     Pain Location  Shoulder    Pain Orientation  Left    Pain Descriptors / Indicators  Sharp;Dull    Pain Type  Acute pain    Pain Onset  More than a month ago    Pain Frequency  Constant    Aggravating Factors   reaching overhead, horizontal ABD, reaching behind back    Pain Relieving Factors  rest    Effect of Pain on Daily Activities  limited with ADLS         [facility] PT Assessment - 02/22/19 0001      Assessment   Medical Diagnosis  Left sided numbness    Referring Provider (PT)  Marcellus Scott, MD    Onset Date/Surgical Date  01/02/19    Hand Dominance  Right      Precautions   Precautions  Fall    Precaution Comments  2 falls in past 6 months      Restrictions   Weight Bearing Restrictions  No      Balance Screen   Has the patient fallen in the past 6 months  Yes    How many times?  2   lost balance outside in Nov, 2nd getting out of bed fell bwd   Has the patient had a decrease in activity level because of a fear of falling?   No    Is the patient reluctant to leave their home because of a fear of falling?   No      Home Environment   Living Environment   Private residence    Living Arrangements  Alone    Available Help at Discharge  Family    Type of Home  House    Additional Comments  lives on main floor; basement but doesn't go down much      Prior Function   Level of Independence  Independent    Vocation  Retired      Observation/Other Assessments  Focus on Therapeutic Outcomes (FOTO)   53% limited      Posture/Postural Control   Posture Comments  stands in bil knee flex, post pelvic tilt rounded shoulders fwd head      ROM / Strength   AROM / PROM / Strength  AROM;PROM;Strength      AROM   Overall AROM Comments  Bil hips knees WFL but flexibility deficits in HS/Hip flexors    AROM Assessment Site  Shoulder    Right/Left Shoulder  Left    Left Shoulder Extension  50 Degrees    Left Shoulder Flexion  98 Degrees    Left Shoulder ABduction  78 Degrees    Left Shoulder Internal Rotation  33 Degrees    Left Shoulder External Rotation  33 Degrees      PROM   PROM Assessment Site  Shoulder    Right/Left Shoulder  Left    Left Shoulder Extension  57 Degrees    Left Shoulder Flexion  115 Degrees    Left Shoulder ABduction  114 Degrees    Left Shoulder Internal Rotation  35 Degrees    Left Shoulder External Rotation  40 Degrees      Strength   Overall Strength Comments  right shoulder 5/5 except ER 4+/5,; Bil hip flex and ADD (in sitting) 4+/5, bil knees 5/5, DF 5/5    Strength Assessment Site  Shoulder    Right/Left Shoulder  Left    Left Shoulder Flexion  4/5   pain   Left Shoulder Extension  4+/5    Left Shoulder ABduction  4+/5   paiin   Left Shoulder Internal Rotation  5/5    Left Shoulder External Rotation  4+/5      Special Tests   Other special tests  Postive Michel Bickers and Impingement sign on left      Ambulation/Gait   Ambulation/Gait  Yes    Ambulation/Gait Assistance  6: Modified independent (Device/Increase time)    Ambulation Distance (Feet)  80 Feet    Assistive device  Straight cane   2nd 40  ft.    Gait Pattern  Decreased step length - right;Decreased step length - left;Decreased stance time - left;Decreased stride length;Right flexed knee in stance;Left flexed knee in stance;Wide base of support;Poor foot clearance - left;Poor foot clearance - right    Ambulation Surface  Level                Objective measurements completed on examination: See above findings.              PT Education - 02/22/19 1302    Education Details  HEP    Person(s) Educated  Patient    Methods  Explanation;Demonstration;Handout    Comprehension  Verbalized understanding;Returned demonstration       PT Short Term Goals - 02/22/19 1615      PT SHORT TERM GOAL #1   Title  Independent with Initial HEP    Time  2    Period  Weeks    Status  New    Target Date  03/08/19      PT SHORT TERM GOAL #2   Title  decreased pain in left shoulder with ADLS by 52%    Time  4    Period  Weeks    Status  New    Target Date  03/22/19        PT Long Term Goals - 02/22/19 1615      PT LONG  TERM GOAL #1   Title  Independent with HEP for strengthening and flexibility to increase function.    Time  8    Period  Weeks    Status  New    Target Date  04/19/19      PT LONG TERM GOAL #2   Title  Improved left shoulder ROM to [facility] to perform normal ADLS with 2/10 pain or less.    Time  8    Period  Weeks    Status  New      PT LONG TERM GOAL #3   Title  Improved left shoulder strength to 5/5 to normalize ADLS    Time  8    Period  Weeks    Status  New      PT LONG TERM GOAL #4   Title  Patient able to safely amb 500 feet with least restrictive AD and without verbal cueing for correct form.    Time  8    Period  Weeks    Status  New      PT LONG TERM GOAL #5   Title  FOTO limitations <= 34%      Additional Long Term Goals   Additional Long Term Goals  Yes      PT LONG TERM GOAL #6   Title  Deceased N/T in left side by 50% or more.    Time  8    Period  Weeks    Status   New             Plan - 02/22/19 1148    Clinical Impression Statement  Patient presents today s/p lacunar infarct in December 2020. He has residual N/T in his left UE and LE. His main complaint is of left shoulder pain which began in November 2020 after lifting his 60#  dog from the bath with just his left arm. He now has pain with reaching forward or out to the side, overhead movements and reaching behind his back. He has marked limitations in ROM as well and pain with MMT. Patient also reports two falls in the past 6 months and has gait abnormalites making him a potential fall risk. He reports using a cane sometimes, but did not have one today. PT advised use of cane until futher asessment. Patient will benefit from PT to restore left shoulder ROM, decrease pain and improve balance.    Personal Factors and Comorbidities  Age;Comorbidity 3+    Comorbidities  TIA December 2020, CAD, DM, HTN, arthritis, bypass surgery 2013, bil knee arthroscopies    Examination-Activity Limitations  Carry;Lift;Stairs    Stability/Clinical Decision Making  Evolving/Moderate complexity    Clinical Decision Making  Moderate    Rehab Potential  Good    PT Frequency  2x / week    PT Duration  8 weeks    PT Treatment/Interventions  ADLs/Self Care Home Management;Electrical Stimulation;Iontophoresis 4mg /ml Dexamethasone;Moist Heat;Therapeutic exercise;Balance training;Neuromuscular re-education;Patient/family education;Manual techniques;Dry needling;Passive range of motion;Joint Manipulations;Cryotherapy;Ultrasound;Gait training    PT Next Visit Plan  BERG, Shoulder ROM/strength, gait/balance, add goals for balance prn    PT Home Exercise Plan     Consulted and Agree with Plan of Care  Patient       Patient will benefit from skilled therapeutic intervention in order to improve the following deficits and impairments:  Abnormal gait, Decreased balance, Pain, Postural dysfunction, Impaired flexibility,  Decreased strength, Decreased activity tolerance, Decreased range of motion, Impaired UE functional use, Impaired sensation  Visit Diagnosis: Stiffness of left shoulder, not elsewhere classified - Plan: PT plan of care cert/re-cert  Acute pain of left shoulder - Plan: PT plan of care cert/re-cert  Unsteadiness on feet - Plan: PT plan of care cert/re-cert  Other abnormalities of gait and mobility - Plan: PT plan of care cert/re-cert     Problem List Patient Active Problem List   Diagnosis Date Noted  . Left sided numbness 02/06/2019  . Acute lacunar infarction (HCC) 02/06/2019  . Obesity, Class II, BMI 35-39.9 02/06/2019  . Dyslipidemia   . Ischemic cardiomyopathy 10/12/2017  . Mild pulmonary hypertension (HCC) 10/12/2017  . S/P CABG (coronary artery bypass graft) 10/01/2017  . Status post coronary artery stent placement   . Left ventricular systolic dysfunction 09/30/2017  . Diverticulitis of sigmoid colon 07/24/2017  . Chronic systolic CHF (congestive heart failure) (HCC) 07/24/2017  . CKD (chronic kidney disease), stage III 07/24/2017  . Pleural thickening 07/24/2017  . Aortic atherosclerosis (HCC) 07/07/2017  . Abnormal nuclear cardiac imaging test 11/14/2013  . Abnormal EKG 03/09/2013  . Dilated aortic root (HCC)   . Edema 02/09/2013  . Hypertension 10/24/2011  . Coronary artery disease 10/24/2011  . Hyperlipidemia 10/24/2011  . DM (diabetes mellitus), type 2, uncontrolled with complications (HCC) 10/24/2011  . GERD (gastroesophageal reflux disease) 10/24/2011    Solon Palm PT 02/22/2019, 4:27 PM  [facility] [address] [place], [place], [address] Phone: [phone]   Fax:  [phone]  Name: James [name] MRN: [id number] Date of Birth: February 05, 1946

## 2019-02-22 NOTE — Patient Instructions (Signed)
Access Code: [email or web address]  URL: [email or web address]  Date: 02/22/2019  Prepared by: Raynelle Fanning Elvy Mclarty   Exercises Supine Shoulder Flexion Extension AAROM with Dowel - 10 reps - 5 seconds hold - 1x daily - 7x weekly Supine Shoulder External Rotation with Dowel - 10 reps - 5 sseconds hold - 1x daily - 7x weekly Supine Chest Stretch with Elbows Bent - 2 reps - 1 sets - 30-60seconds hold - 2x daily - 7x weekly Standing Shoulder Extension with Dowel - 10 reps - 3 sets - 1x daily - 7x weekly Standing Shoulder Row - 20 reps - 3-5 seconds hold - 2x daily - 7x weekly Standing Bilateral Shoulder Internal Rotation AAROM with Dowel - 10 reps - 1 sets - 3-5 hold - 2x daily - 7x weekly Standing Shoulder Extension with Dowel - 10 reps - 1 sets - 3-5 hold - 2x daily - 7x weekly

## 2019-02-24 ENCOUNTER — Ambulatory Visit (INDEPENDENT_AMBULATORY_CARE_PROVIDER_SITE_OTHER): Payer: Medicare HMO | Admitting: Physical Therapy

## 2019-02-24 ENCOUNTER — Other Ambulatory Visit: Payer: Self-pay

## 2019-02-24 DIAGNOSIS — M25512 Pain in left shoulder: Secondary | ICD-10-CM

## 2019-02-24 DIAGNOSIS — M25612 Stiffness of left shoulder, not elsewhere classified: Secondary | ICD-10-CM

## 2019-02-24 NOTE — Therapy (Signed)
[facility] [place], [place], [address] Phone: [phone]   Fax:  [phone]  Physical Therapy Treatment  Patient Details  Name: James [name] MRN: [id number] Date of Birth: [birth year] Referring Provider (PT): Vernell Leep, MD   Encounter Date: 02/24/2019  PT End of Session - 02/24/19 1145    Visit Number  2    Number of Visits  12    Date for PT Re-Evaluation  04/05/19    Authorization Type  [employer] MCR    PT Start Time  1105    PT Stop Time  1144    PT Time Calculation (min)  39 min    Activity Tolerance  Patient tolerated treatment well    Behavior During Therapy  [facility] for tasks assessed/performed       Past Medical History:  Diagnosis Date  . Abnormal EKG 03/09/2013  . Abnormal nuclear cardiac imaging test 11/14/2013  . Acute lacunar infarction ([place]) 02/06/2019  . Aortic atherosclerosis ([place]) 07/07/2017  . Arthritis    "all over" (09/30/2017)  . Ascending aortic aneurysm ([place])    cMRI (2/15): Normal LV EF 54%, Mild BAE, Trileaflet Aortic Valve, Upper limits of normal ascending aorta 3.8 cm, Normal aortic arch 2.3 cm with bovine origin of left carotid  . Atrial flutter (HCC)    Post-op with no reoccurence  . Chronic diastolic heart failure ([place])    Echo (1/15): Left ventricle: The cavity size was mildly dilated. Mild LVH. EF 55% to 60%. Wall motion was normal; Gr 1 diastolic dysfunction Left atrium: The atrium was mildly dilated. Right atrium: The atrium was mildly to moderately dilated. Aortic arch measures 4.8 cm (moderately dilated); suggest CTA or MRA to better assess.  . Chronic systolic CHF (congestive heart failure) ([place]) 07/24/2017  . CKD (chronic kidney disease), stage III 07/24/2017  . Coronary artery disease 10/2011   a. s/p cabg;  b. Myoview (9/15):  ant ischemia, poss TID, EF 43%, high risk >> LHC (9/15): Dist LM 50, pLAD 90 then 100, pCFX 50, mCFX 75, oRCA 75, dRCA 95, L-LAD patent, S-Dx  100, S-OM1 patent, S-PDA patent, EF 55% >> native Dx small caliber and not well suited for PCI; native RCA amenable to PCI and would restore flow to PLA branches but would jeopardize SVG-RCA - Med Rx  . Deviated nasal septum   . Dilated aortic root ([place])   . Diverticulitis of sigmoid colon 07/24/2017  . DM (diabetes mellitus), type 2, uncontrolled with complications ([place])    onset 2013  . Dyslipidemia   . Edema 02/09/2013  . GERD (gastroesophageal reflux disease)   . Hx of Doppler ultrasound    Carotid US (9/13): No ICA stenosis  . Hypertension   . Pleural thickening 07/24/2017    Past Surgical History:  Procedure Laterality Date  . CORONARY ARTERY BYPASS GRAFT  10/27/2011   Procedure: CORONARY ARTERY BYPASS GRAFTING (CABG);  Surgeon: Ivin Poot, MD;  Location: [place];  Service: Open Heart Surgery;  Laterality: N/A;  Coronary Artery Bypass Grafting times four using left internal mammary artery and right greater saphenous vein endoscopically harvested  . CORONARY STENT INTERVENTION N/A 09/30/2017   Procedure: CORONARY STENT INTERVENTION;  Surgeon: Jettie Booze, MD;  Location: [place] [facility];  Service: Cardiovascular;  Laterality: N/A;  . KNEE ARTHROSCOPY Left 2003  . KNEE ARTHROSCOPY Right 2006  . LEFT HEART CATHETERIZATION WITH CORONARY ANGIOGRAM N/A 10/23/2011   Procedure: LEFT HEART CATHETERIZATION WITH CORONARY  ANGIOGRAM;  Surgeon: Quintella Reichert, MD;  Location: [facility] C[facility] LAB;  Service: Cardiovascular;  Laterality: N/A;  . LEFT HEART CATHETERIZATION WITH CORONARY ANGIOGRAM N/A 11/16/2013   Procedure: LEFT HEART CATHETERIZATION WITH CORONARY ANGIOGRAM;  Surgeon: Micheline Chapman, MD;  Location: [facility] CATH LAB;  Service: Cardiovascular;  Laterality: N/A;  . RIGHT/LEFT HEART CATH AND CORONARY/GRAFT ANGIOGRAPHY N/A 09/30/2017   Procedure: RIGHT/LEFT HEART CATH AND CORONARY/GRAFT ANGIOGRAPHY;  Surgeon: Corky Crafts, MD;  Location: [facility] INVASIVE CV LAB;  Service: Cardiovascular;   Laterality: N/A;    There were no vitals filed for this visit.  Subjective Assessment - 02/24/19 1323    Subjective  Pt reports he had trouble reading the exercises, performed what he could.  His Lt shoulder is slightly better.    Currently in Pain?  Yes   0 at rest, 3 with abd/ flexion   Pain Score  3     Pain Location  Shoulder    Pain Orientation  Left    Pain Descriptors / Indicators  Lambert Mody         [facility] PT Assessment - 02/24/19 0001      Assessment   Medical Diagnosis  Left sided numbness    Referring Provider (PT)  Marcellus Scott, MD    Onset Date/Surgical Date  01/02/19    Hand Dominance  Right      AROM   Left Shoulder Flexion  110 Degrees      PROM   Right/Left Shoulder  Left    Left Shoulder Flexion  135 Degrees    Left Shoulder External Rotation  65 Degrees       [facility] Adult PT Treatment/Exercise - 02/24/19 0001      Shoulder Exercises: Supine   External Rotation  5 reps;AAROM;Left   cane, 10 sec hold.    Flexion  AAROM;Both;10 reps   cane   ABduction  Left;5 reps;AAROM   with cane     Shoulder Exercises: Seated   Flexion  Both;10 reps   to 90 with row motion, holding cane     Shoulder Exercises: Standing   Internal Rotation  AAROM;Both;10 reps   cane behind back   Extension  AAROM;Both;10 reps   cane behind back     Shoulder Exercises: Stretch   Star Gazer Stretch  3 reps;20 seconds    Other Shoulder Stretches  trial of sustained stretch into abdct ~60 deg    Other Shoulder Stretches  trial of snow angels, to tolerance x 5 rpes      Manual Therapy   Manual Therapy  Myofascial release;Soft tissue mobilization    Soft tissue mobilization  to Lt pec, bicep, ant shoulder     Myofascial Release  to Lt pec               PT Short Term Goals - 02/22/19 1615      PT SHORT TERM GOAL #1   Title  Independent with Initial HEP    Time  2    Period  Weeks    Status  New    Target Date  03/08/19      PT SHORT TERM GOAL #2   Title   decreased pain in left shoulder with ADLS by 50%    Time  4    Period  Weeks    Status  New    Target Date  03/22/19        PT Long Term Goals - 02/22/19 1615  PT LONG TERM GOAL #1   Title  Independent with HEP for strengthening and flexibility to increase function.    Time  8    Period  Weeks    Status  New    Target Date  04/19/19      PT LONG TERM GOAL #2   Title  Improved left shoulder ROM to [facility] to perform normal ADLS with 2/10 pain or less.    Time  8    Period  Weeks    Status  New      PT LONG TERM GOAL #3   Title  Improved left shoulder strength to 5/5 to normalize ADLS    Time  8    Period  Weeks    Status  New      PT LONG TERM GOAL #4   Title  Patient able to safely amb 500 feet with least restrictive AD and without verbal cueing for correct form.    Time  8    Period  Weeks    Status  New      PT LONG TERM GOAL #5   Title  FOTO limitations <= 34%      Additional Long Term Goals   Additional Long Term Goals  Yes      PT LONG TERM GOAL #6   Title  Deceased N/T in left side by 50% or more.    Time  8    Period  Weeks    Status  New            Plan - 02/24/19 1319    Clinical Impression Statement  Pt demonstrating improved Lt shoulder ROM.  HEP reviewed and reprinted with larger pictures per pt request.  Pt had some discomfort with exercises initially, but subsided after a few reps of each. Pt reported reduction of pain to 1/10 by end of session.   Pt ambulating with his cane today; may focus on balance next visit if shoulder still progressing.  Progressing towards goals.    Personal Factors and Comorbidities  Age;Comorbidity 3+    Comorbidities  TIA December 2020, CAD, DM, HTN, arthritis, bypass surgery 2013, bil knee arthroscopies    Examination-Activity Limitations  Carry;Lift;Stairs    Stability/Clinical Decision Making  Evolving/Moderate complexity    Rehab Potential  Good    PT Frequency  2x / week    PT Duration  8 weeks    PT  Treatment/Interventions  ADLs/Self Care Home Management;Electrical Stimulation;Iontophoresis 4mg /ml Dexamethasone;Moist Heat;Therapeutic exercise;Balance training;Neuromuscular re-education;Patient/family education;Manual techniques;Dry needling;Passive range of motion;Joint Manipulations;Cryotherapy;Ultrasound;Gait training    PT Next Visit Plan  BERG, Shoulder ROM/strength, gait/balance, add goals for balance prn    PT Home Exercise Plan     Consulted and Agree with Plan of Care  Patient       Patient will benefit from skilled therapeutic intervention in order to improve the following deficits and impairments:  Abnormal gait, Decreased balance, Pain, Postural dysfunction, Impaired flexibility, Decreased strength, Decreased activity tolerance, Decreased range of motion, Impaired UE functional use, Impaired sensation  Visit Diagnosis: Stiffness of left shoulder, not elsewhere classified  Acute pain of left shoulder     Problem List Patient Active Problem List   Diagnosis Date Noted  . Left sided numbness 02/06/2019  . Acute lacunar infarction (HCC) 02/06/2019  . Obesity, Class II, BMI 35-39.9 02/06/2019  . Dyslipidemia   . Ischemic cardiomyopathy 10/12/2017  . Mild pulmonary hypertension (HCC) 10/12/2017  . S/P CABG (coronary artery bypass  graft) 10/01/2017  . Status post coronary artery stent placement   . Left ventricular systolic dysfunction 09/30/2017  . Diverticulitis of sigmoid colon 07/24/2017  . Chronic systolic CHF (congestive heart failure) (HCC) 07/24/2017  . CKD (chronic kidney disease), stage III 07/24/2017  . Pleural thickening 07/24/2017  . Aortic atherosclerosis (HCC) 07/07/2017  . Abnormal nuclear cardiac imaging test 11/14/2013  . Abnormal EKG 03/09/2013  . Dilated aortic root (HCC)   . Edema 02/09/2013  . Hypertension 10/24/2011  . Coronary artery disease 10/24/2011  . Hyperlipidemia 10/24/2011  . DM (diabetes mellitus), type 2, uncontrolled with  complications (HCC) 10/24/2011  . GERD (gastroesophageal reflux disease) 10/24/2011   Mayer Camel, PTA 02/24/19 1:31 PM  [facility] [place] [address] [place], [place], [address] Phone: [phone]   Fax:  [phone]  Name: James [name] MRN: [id number] Date of Birth: 28-Feb-1945

## 2019-03-01 ENCOUNTER — Ambulatory Visit (INDEPENDENT_AMBULATORY_CARE_PROVIDER_SITE_OTHER): Payer: Medicare HMO | Admitting: Physical Therapy

## 2019-03-01 ENCOUNTER — Encounter: Payer: Self-pay | Admitting: Physical Therapy

## 2019-03-01 ENCOUNTER — Other Ambulatory Visit: Payer: Self-pay

## 2019-03-01 DIAGNOSIS — M25612 Stiffness of left shoulder, not elsewhere classified: Secondary | ICD-10-CM

## 2019-03-01 DIAGNOSIS — R2681 Unsteadiness on feet: Secondary | ICD-10-CM

## 2019-03-01 DIAGNOSIS — R2689 Other abnormalities of gait and mobility: Secondary | ICD-10-CM | POA: Diagnosis not present

## 2019-03-01 NOTE — Therapy (Signed)
[facility] [place], [place], [address] Phone: [phone]   Fax:  [phone]  Physical Therapy Treatment  Patient Details  Name: James [name] MRN: [id number] Date of Birth: 05-07-1945 Referring Provider (PT): Vernell Leep, MD   Encounter Date: 03/01/2019  PT End of Session - 03/01/19 1117    Visit Number  3    Number of Visits  12    Date for PT Re-Evaluation  04/05/19    Authorization Type  [employer] MCR    PT Start Time  1107   pt arrived late   PT Stop Time  1145    PT Time Calculation (min)  38 min    Activity Tolerance  Patient tolerated treatment well;No increased pain    Behavior During Therapy  W[facility] for tasks assessed/performed       Past Medical History:  Diagnosis Date  . Abnormal EKG 03/09/2013  . Abnormal nuclear cardiac imaging test 11/14/2013  . Acute lacunar infarction ([place]) 02/06/2019  . Aortic atherosclerosis ([place]) 07/07/2017  . Arthritis    "all over" (09/30/2017)  . Ascending aortic aneurysm ([place])    cMRI (2/15): Normal LV EF 54%, Mild BAE, Trileaflet Aortic Valve, Upper limits of normal ascending aorta 3.8 cm, Normal aortic arch 2.3 cm with bovine origin of left carotid  . Atrial flutter (HCC)    Post-op with no reoccurence  . Chronic diastolic heart failure ([place])    Echo (1/15): Left ventricle: The cavity size was mildly dilated. Mild LVH. EF 55% to 60%. Wall motion was normal; Gr 1 diastolic dysfunction Left atrium: The atrium was mildly dilated. Right atrium: The atrium was mildly to moderately dilated. Aortic arch measures 4.8 cm (moderately dilated); suggest CTA or MRA to better assess.  . Chronic systolic CHF (congestive heart failure) ([place]) 07/24/2017  . CKD (chronic kidney disease), stage III 07/24/2017  . Coronary artery disease 10/2011   a. s/p cabg;  b. Myoview (9/15):  ant ischemia, poss TID, EF 43%, high risk >> LHC (9/15): Dist LM 50, pLAD 90 then 100, pCFX 50, mCFX 75,  oRCA 75, dRCA 95, L-LAD patent, S-Dx 100, S-OM1 patent, S-PDA patent, EF 55% >> native Dx small caliber and not well suited for PCI; native RCA amenable to PCI and would restore flow to PLA branches but would jeopardize SVG-RCA - Med Rx  . Deviated nasal septum   . Dilated aortic root ([place])   . Diverticulitis of sigmoid colon 07/24/2017  . DM (diabetes mellitus), type 2, uncontrolled with complications ([place])    onset 2013  . Dyslipidemia   . Edema 02/09/2013  . GERD (gastroesophageal reflux disease)   . Hx of Doppler ultrasound    Carotid US (9/13): No ICA stenosis  . Hypertension   . Pleural thickening 07/24/2017    Past Surgical History:  Procedure Laterality Date  . CORONARY ARTERY BYPASS GRAFT  10/27/2011   Procedure: CORONARY ARTERY BYPASS GRAFTING (CABG);  Surgeon: Ivin Poot, MD;  Location: [place];  Service: Open Heart Surgery;  Laterality: N/A;  Coronary Artery Bypass Grafting times four using left internal mammary artery and right greater saphenous vein endoscopically harvested  . CORONARY STENT INTERVENTION N/A 09/30/2017   Procedure: CORONARY STENT INTERVENTION;  Surgeon: Jettie Booze, MD;  Location: [place] [facility];  Service: Cardiovascular;  Laterality: N/A;  . KNEE ARTHROSCOPY Left 2003  . KNEE ARTHROSCOPY Right 2006  . LEFT HEART CATHETERIZATION WITH CORONARY ANGIOGRAM N/A 10/23/2011  Procedure: LEFT HEART CATHETERIZATION WITH CORONARY ANGIOGRAM;  Surgeon: Sueanne Margarita, MD;  Location: [place] CATH LAB;  Service: Cardiovascular;  Laterality: N/A;  . LEFT HEART CATHETERIZATION WITH CORONARY ANGIOGRAM N/A 11/16/2013   Procedure: LEFT HEART CATHETERIZATION WITH CORONARY ANGIOGRAM;  Surgeon: Blane Ohara, MD;  Location: [facility] CATH LAB;  Service: Cardiovascular;  Laterality: N/A;  . RIGHT/LEFT HEART CATH AND CORONARY/GRAFT ANGIOGRAPHY N/A 09/30/2017   Procedure: RIGHT/LEFT HEART CATH AND CORONARY/GRAFT ANGIOGRAPHY;  Surgeon: Jettie Booze, MD;  Location: [facility];  Service: Cardiovascular;  Laterality: N/A;    There were no vitals filed for this visit.  Subjective Assessment - 03/01/19 1108    Subjective  Pt reports he can move his Lt shoulder a lot better.  He noticed he can reach with greater ease.  His biggest concern today is his balance.    Currently in Pain?  No/denies   2/10 with L arm movement; baseline.   Pain Location  Shoulder    Pain Orientation  Left         [facility] PT Assessment - 03/01/19 0001      Assessment   Medical Diagnosis  Left sided numbness    Referring Provider (PT)  Vernell Leep, MD    Onset Date/Surgical Date  01/02/19    Hand Dominance  Right      AROM   Right/Left Shoulder  Left    Left Shoulder Extension  60 Degrees    Left Shoulder Flexion  130 Degrees    Left Shoulder ABduction  111 Degrees    Left Shoulder Internal Rotation  38 Degrees    Left Shoulder External Rotation  59 Degrees       [facility] Adult PT Treatment/Exercise - 03/01/19 0001      Knee/Hip Exercises: Stretches   Passive Hamstring Stretch  Right;Left;2 reps;20 seconds    Quad Stretch  Left;Right;2 reps;20 seconds   seated, foot under chair   Hip Flexor Stretch Limitations  trial of seated; unable to attain position     Press photographer  Right;Left;2 reps;20 seconds    Gastroc Stretch Limitations  cues for counting and form    Other Knee/Hip Stretches  standing trunk ext with hands on counter, leaning hips forward x 5 sec hold x 3 reps       Knee/Hip Exercises: Standing   Hip Extension  Stengthening;Right;Left;1 set;5 reps      Knee/Hip Exercises: Seated   Sit to Sand  5 reps;without UE support      Shoulder Exercises: Supine   External Rotation  5 reps;AAROM;Left   cane, 10 sec hold.    Flexion  AAROM;Both;5 reps   cane     Shoulder Exercises: Standing   Internal Rotation  AAROM;Both;5 reps   cane behind back   Extension  AAROM;Both;5 reps   cane behind back              PT Short Term Goals - 03/01/19 1239       PT SHORT TERM GOAL #1   Title  Independent with Initial HEP    Time  2    Period  Weeks    Status  On-going    Target Date  03/08/19      PT SHORT TERM GOAL #2   Title  decreased pain in left shoulder with ADLS by 16%    Time  4    Period  Weeks    Status  Achieved    Target Date  03/22/19        PT Long Term Goals - 03/01/19 1236      PT LONG TERM GOAL #1   Title  Independent with HEP for strengthening and flexibility to increase function.    Time  8    Period  Weeks    Status  On-going      PT LONG TERM GOAL #2   Title  Improved left shoulder ROM to [facility] to perform normal ADLS with 6/27 pain or less.    Time  8    Period  Weeks    Status  On-going      PT LONG TERM GOAL #3   Title  Improved left shoulder strength to 5/5 to normalize ADLS    Time  8    Period  Weeks    Status  On-going      PT LONG TERM GOAL #4   Title  Patient able to safely amb 500 feet with least restrictive AD and without verbal cueing for correct form.    Time  8    Period  Weeks    Status  On-going      PT LONG TERM GOAL #5   Title  FOTO limitations <= 34%    Status  On-going      PT LONG TERM GOAL #6   Title  Deceased N/T in left side by 50% or more.    Time  8    Period  Weeks    Status  On-going            Plan - 03/01/19 1212    Clinical Impression Statement  Pt demonstrated much improved Lt shoulder ROM with decreased pain level.  Session focused on increasing LE flexibility, with plans for future sessions with balance training.  Pt reported some discomfort in Lt knee with seated quad stretch; reduced after completion of stretch. Pt has met LTG #2 and STG #2.    Personal Factors and Comorbidities  Age;Comorbidity 3+    Comorbidities  TIA December 2020, CAD, DM, HTN, arthritis, bypass surgery 2013, bil knee arthroscopies    Examination-Activity Limitations  Carry;Lift;Stairs    Stability/Clinical Decision Making  Evolving/Moderate complexity    Rehab Potential  Good     PT Frequency  2x / week    PT Duration  8 weeks    PT Treatment/Interventions  ADLs/Self Care Home Management;Electrical Stimulation;Iontophoresis 76m/ml Dexamethasone;Moist Heat;Therapeutic exercise;Balance training;Neuromuscular re-education;Patient/family education;Manual techniques;Dry needling;Passive range of motion;Joint Manipulations;Cryotherapy;Ultrasound;Gait training    PT Next Visit Plan  BERG (PT to create balance goals as needed); gait/ balance. review LE stretches. work towards being able to get out of recliner without getting onto knees and pushing up with arms.    PT Home Exercise Plan  [email or web address]   Consulted and Agree with Plan of Care  Patient       Patient will benefit from skilled therapeutic intervention in order to improve the following deficits and impairments:  Abnormal gait, Decreased balance, Pain, Postural dysfunction, Impaired flexibility, Decreased strength, Decreased activity tolerance, Decreased range of motion, Impaired UE functional use, Impaired sensation  Visit Diagnosis: Stiffness of left shoulder, not elsewhere classified  Unsteadiness on feet  Other abnormalities of gait and mobility     Problem List Patient Active Problem List   Diagnosis Date Noted  . Left sided numbness 02/06/2019  . Acute [place]unar infarction ([place] 02/06/2019  . Obesity, Class II, BMI 35-39.9 02/06/2019  . Dyslipidemia   . Ischemic cardiomyopathy 10/12/2017  .  Mild pulmonary hypertension ([place]) 10/12/2017  . S/P CABG (coronary artery bypass graft) 10/01/2017  . Status post coronary artery stent placement   . Left ventricular systolic dysfunction 91/69/4503  . Diverticulitis of sigmoid colon 07/24/2017  . Chronic systolic CHF (congestive heart failure) ([place]) 07/24/2017  . CKD (chronic kidney disease), stage III 07/24/2017  . Pleural thickening 07/24/2017  . Aortic atherosclerosis ([place]) 07/07/2017  . Abnormal nuclear cardiac imaging test 11/14/2013  . Abnormal EKG  03/09/2013  . Dilated aortic root ([place])   . Edema 02/09/2013  . Hypertension 10/24/2011  . Coronary artery disease 10/24/2011  . Hyperlipidemia 10/24/2011  . DM (diabetes mellitus), type 2, uncontrolled with complications ([place]) 88/82/8003  . GERD (gastroesophageal reflux disease) 10/24/2011   Kerin Perna, PTA 03/01/19 12:45 PM  [place], [place], [address] Phone: [phone]   Fax:  [phone]  Name: DIONISIO ARAGONES MRN: [id number] Date of Birth: 04-Sep-1945

## 2019-03-01 NOTE — Patient Instructions (Signed)
Access Code: [email or web address]  URL: [email or web address]  Date: 03/01/2019  Prepared by: Mayer Camel   Exercises  Supine Shoulder Flexion Extension AAROM with Dowel - 10 reps - 5 seconds hold - 1-2x daily - 7x weekly  Supine Shoulder External Rotation with Dowel - 5 reps - 10 sseconds hold - 2x daily - 7x weekly  Supine Chest Stretch with Elbows Bent - 3-5 reps - 1 sets - 15 hold - 2x daily - 7x weekly  Standing Shoulder Extension with Dowel - 10 reps - 1 sets - 2x daily - 7x weekly  Standing Bilateral Shoulder Internal Rotation AAROM with Dowel - 10 reps - 1 sets - 3-5 hold - 2x daily - 7x weekly  Gastroc Stretch on Wall - 2 reps - 1 sets - 15 seconds hold - 1x daily - 7x weekly  Seated Hamstring Stretch - 2-3 reps - 1 sets - 15 seconds hold - 1x daily - 7x weekly  Standing Hip Extension - 5-10 reps - 1 sets - 1x daily - 7x weekly  Sit to Stand - 5 reps - 1 sets - 1-2x daily - 7x weekly

## 2019-03-02 ENCOUNTER — Encounter (INDEPENDENT_AMBULATORY_CARE_PROVIDER_SITE_OTHER): Payer: Medicare HMO

## 2019-03-02 DIAGNOSIS — I480 Paroxysmal atrial fibrillation: Secondary | ICD-10-CM

## 2019-03-02 DIAGNOSIS — I639 Cerebral infarction, unspecified: Secondary | ICD-10-CM | POA: Diagnosis not present

## 2019-03-02 DIAGNOSIS — G459 Transient cerebral ischemic attack, unspecified: Secondary | ICD-10-CM

## 2019-03-03 ENCOUNTER — Encounter: Payer: Medicare HMO | Admitting: Physical Therapy

## 2019-03-03 ENCOUNTER — Other Ambulatory Visit (HOSPITAL_COMMUNITY): Payer: Medicare HMO

## 2019-03-06 ENCOUNTER — Encounter (HOSPITAL_BASED_OUTPATIENT_CLINIC_OR_DEPARTMENT_OTHER): Payer: Medicare HMO | Admitting: Cardiology

## 2019-03-08 ENCOUNTER — Ambulatory Visit (INDEPENDENT_AMBULATORY_CARE_PROVIDER_SITE_OTHER): Payer: Medicare HMO | Admitting: Physical Therapy

## 2019-03-08 ENCOUNTER — Encounter: Payer: Self-pay | Admitting: Physical Therapy

## 2019-03-08 ENCOUNTER — Other Ambulatory Visit: Payer: Self-pay

## 2019-03-08 DIAGNOSIS — R2681 Unsteadiness on feet: Secondary | ICD-10-CM

## 2019-03-08 DIAGNOSIS — M25612 Stiffness of left shoulder, not elsewhere classified: Secondary | ICD-10-CM

## 2019-03-08 DIAGNOSIS — R2689 Other abnormalities of gait and mobility: Secondary | ICD-10-CM | POA: Diagnosis not present

## 2019-03-08 DIAGNOSIS — M25512 Pain in left shoulder: Secondary | ICD-10-CM | POA: Diagnosis not present

## 2019-03-08 NOTE — Therapy (Signed)
[facility] [place] [address] [place], [place], [address] Phone: [phone]   Fax:  [phone]  Physical Therapy Treatment  Patient Details  Name: James [name] MRN: [id number] Date of Birth: August 24, 1945 Referring Provider (PT): Marcellus Scott, MD   Encounter Date: 03/08/2019  PT End of Session - 03/08/19 1107    Visit Number  4    Number of Visits  12    Date for PT Re-Evaluation  04/05/19    Authorization Type  [employer] MCR    PT Start Time  1107    PT Stop Time  1146    PT Time Calculation (min)  39 min    Activity Tolerance  Patient tolerated treatment well;No increased pain    Behavior During Therapy  WFL for tasks assessed/performed       Past Medical History:  Diagnosis Date  . Abnormal EKG 03/09/2013  . Abnormal nuclear cardiac imaging test 11/14/2013  . Acute lacunar infarction (HCC) 02/06/2019  . Aortic atherosclerosis (HCC) 07/07/2017  . Arthritis    "all over" (09/30/2017)  . Ascending aortic aneurysm (HCC)    cMRI (2/15): Normal LV EF 54%, Mild BAE, Trileaflet Aortic Valve, Upper limits of normal ascending aorta 3.8 cm, Normal aortic arch 2.3 cm with bovine origin of left carotid  . Atrial flutter (HCC)    Post-op with no reoccurence  . Chronic diastolic heart failure (HCC)    Echo (1/15): Left ventricle: The cavity size was mildly dilated. Mild LVH. EF 55% to 60%. Wall motion was normal; Gr 1 diastolic dysfunction Left atrium: The atrium was mildly dilated. Right atrium: The atrium was mildly to moderately dilated. Aortic arch measures 4.8 cm (moderately dilated); suggest CTA or MRA to better assess.  . Chronic systolic CHF (congestive heart failure) (HCC) 07/24/2017  . CKD (chronic kidney disease), stage III 07/24/2017  . Coronary artery disease 10/2011   a. s/p cabg;  b. Myoview (9/15):  ant ischemia, poss TID, EF 43%, high risk >> LHC (9/15): Dist LM 50, pLAD 90 then 100, pCFX 50, mCFX 75, oRCA 75, dRCA 95,  L-LAD patent, S-Dx 100, S-OM1 patent, S-PDA patent, EF 55% >> native Dx small caliber and not well suited for PCI; native RCA amenable to PCI and would restore flow to PLA branches but would jeopardize SVG-RCA - Med Rx  . Deviated nasal septum   . Dilated aortic root (HCC)   . Diverticulitis of sigmoid colon 07/24/2017  . DM (diabetes mellitus), type 2, uncontrolled with complications (HCC)    onset 2013  . Dyslipidemia   . Edema 02/09/2013  . GERD (gastroesophageal reflux disease)   . Hx of Doppler ultrasound    Carotid US (9/13): No ICA stenosis  . Hypertension   . Pleural thickening 07/24/2017    Past Surgical History:  Procedure Laterality Date  . CORONARY ARTERY BYPASS GRAFT  10/27/2011   Procedure: CORONARY ARTERY BYPASS GRAFTING (CABG);  Surgeon: Kerin Perna, MD;  Location: [facility] OR;  Service: Open Heart Surgery;  Laterality: N/A;  Coronary Artery Bypass Grafting times four using left internal mammary artery and right greater saphenous vein endoscopically harvested  . CORONARY STENT INTERVENTION N/A 09/30/2017   Procedure: CORONARY STENT INTERVENTION;  Surgeon: Corky Crafts, MD;  Location: [facility] INVASIVE CV LAB;  Service: Cardiovascular;  Laterality: N/A;  . KNEE ARTHROSCOPY Left 2003  . KNEE ARTHROSCOPY Right 2006  . LEFT HEART CATHETERIZATION WITH CORONARY ANGIOGRAM N/A 10/23/2011   Procedure: LEFT HEART CATHETERIZATION  WITH CORONARY ANGIOGRAM;  Surgeon: Sueanne Margarita, MD;  Location: [facility] CATH LAB;  Service: Cardiovascular;  Laterality: N/A;  . LEFT HEART CATHETERIZATION WITH CORONARY ANGIOGRAM N/A 11/16/2013   Procedure: LEFT HEART CATHETERIZATION WITH CORONARY ANGIOGRAM;  Surgeon: Blane Ohara, MD;  Location: [facility] CATH LAB;  Service: Cardiovascular;  Laterality: N/A;  . RIGHT/LEFT HEART CATH AND CORONARY/GRAFT ANGIOGRAPHY N/A 09/30/2017   Procedure: RIGHT/LEFT HEART CATH AND CORONARY/GRAFT ANGIOGRAPHY;  Surgeon: Jettie Booze, MD;  Location: [facility] CV LAB;  Service:  Cardiovascular;  Laterality: N/A;    There were no vitals filed for this visit.  Subjective Assessment - 03/08/19 1108    Subjective  Patient reports his shoulder popped this morning when he reached down for something. It hurt when he did it but no pain now and he can move it fine.    Pertinent History  TIA December 2020, CAD, DM, HTN, arthritis, bypass surgery 2013, bil knee arthroscopies    Patient Stated Goals  less pain in shoulder    Currently in Pain?  No/denies         [facility] PT Assessment - 03/08/19 0001      Strength   Left Shoulder Flexion  4/5    Left Shoulder Extension  4+/5    Left Shoulder ABduction  4+/5   paiin   Left Shoulder Internal Rotation  5/5    Left Shoulder External Rotation  4+/5      Standardized Balance Assessment   Standardized Balance Assessment  Berg Balance Test;Timed Up and Go Test;Five Times Sit to Stand    Five times sit to stand comments   13      Berg Balance Test   Sit to Stand  Able to stand without using hands and stabilize independently    Standing Unsupported  Able to stand safely 2 minutes    Sitting with Back Unsupported but Feet Supported on Floor or Stool  Able to sit safely and securely 2 minutes    Stand to Sit  Sits safely with minimal use of hands    Transfers  Able to transfer safely, minor use of hands    Standing Unsupported with Eyes Closed  Able to stand 10 seconds safely    Standing Unsupported with Feet Together  Able to place feet together independently and stand 1 minute safely    From Standing, Reach Forward with Outstretched Arm  Can reach forward >12 cm safely (5")    From Standing Position, Pick up Object from Floor  Able to pick up shoe safely and easily    From Standing Position, Turn to Look Behind Over each Shoulder  Looks behind from both sides and weight shifts well    Turn 360 Degrees  Able to turn 360 degrees safely in 4 seconds or less    Standing Unsupported, Alternately Place Feet on Step/Stool  Able to  stand independently and complete 8 steps >20 seconds    Standing Unsupported, One Foot in [employer] balance while stepping or standing    Standing on One Leg  Tries to lift leg/unable to hold 3 seconds but remains standing independently    Total Score  47      Timed Up and Go Test   Normal TUG (seconds)  13                   [facility] Adult PT Treatment/Exercise - 03/08/19 0001      Exercises   Exercises  Shoulder  Knee/Hip Exercises: Stretches   Passive Hamstring Stretch  Right;Left;20 seconds;1 rep    Gastroc Stretch  Right;Left;2 reps;20 seconds      Knee/Hip Exercises: Standing   Hip Flexion  Stengthening;Both;1 set;10 reps;Knee bent    Hip Flexion Limitations  BUE support    Hip Extension  Both;1 set;10 reps;Knee straight      Shoulder Exercises: Supine   ABduction  Strengthening;Left;5 reps    ABduction Limitations  painful    Other Supine Exercises  flex/scap 2x10 flex with 1#, scaption hurt with 1#      Shoulder Exercises: Sidelying   ABduction  Left;20 reps    ABduction Limitations  to 90 deg    Other Sidelying Exercises  empty can x 10      Shoulder Exercises: Standing   External Rotation  Left;20 reps;Theraband    Theraband Level (Shoulder External Rotation)  Level 1 (Yellow)               PT Short Term Goals - 03/08/19 1211      PT SHORT TERM GOAL #1   Title  Independent with Initial HEP    Status  Achieved        PT Long Term Goals - 03/08/19 1156      PT LONG TERM GOAL #1   Title  Independent with HEP for strengthening and flexibility to increase function.    Status  On-going      PT LONG TERM GOAL #2   Title  Improved left shoulder ROM to [facility] to perform normal ADLS with 2/10 pain or less.    Baseline  some pain with IR and ABD today    Status  On-going      PT LONG TERM GOAL #3   Title  Improved left shoulder strength to 5/5 to normalize ADLS    Status  On-going      PT LONG TERM GOAL #7   Title  Improved score on  BERG to 52/56 to decrease fall risk.    Status  New    Target Date  04/05/19            Plan - 03/08/19 1158    Clinical Impression Statement  Patient's balance assessed today with BERG 47/56, 5x sit to stand 13 sec and TUG 15 sec. Patient has difficulty with higher level balance activities like tandem stance and SLS including alternating steps and will benefit from work on these. We started some painfree shoulder strengthening. He requires continual cueing from correct form and speed control.    Comorbidities  TIA December 2020, CAD, DM, HTN, arthritis, bypass surgery 2013, bil knee arthroscopies    Rehab Potential  Good    PT Frequency  2x / week    PT Duration  8 weeks    PT Treatment/Interventions  ADLs/Self Care Home Management;Electrical Stimulation;Iontophoresis 4mg /ml Dexamethasone;Moist Heat;Therapeutic exercise;Balance training;Neuromuscular re-education;Patient/family education;Manual techniques;Dry needling;Passive range of motion;Joint Manipulations;Cryotherapy;Ultrasound;Gait training    PT Next Visit Plan  gait/ balance; work on COG on incline. work towards being able to get out of recliner without getting onto knees and pushing up with arms, continue shoulder strengthening/ROM    PT Home Exercise Plan     Consulted and Agree with Plan of Care  Patient       Patient will benefit from skilled therapeutic intervention in order to improve the following deficits and impairments:  Abnormal gait, Decreased balance, Pain, Postural dysfunction, Impaired flexibility, Decreased strength, Decreased activity tolerance, Decreased range of  motion, Impaired UE functional use, Impaired sensation  Visit Diagnosis: Stiffness of left shoulder, not elsewhere classified  Unsteadiness on feet  Other abnormalities of gait and mobility  Acute pain of left shoulder     Problem List Patient Active Problem List   Diagnosis Date Noted  . Left sided numbness 02/06/2019  . Acute  lacunar infarction (HCC) 02/06/2019  . Obesity, Class II, BMI 35-39.9 02/06/2019  . Dyslipidemia   . Ischemic cardiomyopathy 10/12/2017  . Mild pulmonary hypertension (HCC) 10/12/2017  . S/P CABG (coronary artery bypass graft) 10/01/2017  . Status post coronary artery stent placement   . Left ventricular systolic dysfunction 09/30/2017  . Diverticulitis of sigmoid colon 07/24/2017  . Chronic systolic CHF (congestive heart failure) (HCC) 07/24/2017  . CKD (chronic kidney disease), stage III 07/24/2017  . Pleural thickening 07/24/2017  . Aortic atherosclerosis (HCC) 07/07/2017  . Abnormal nuclear cardiac imaging test 11/14/2013  . Abnormal EKG 03/09/2013  . Dilated aortic root (HCC)   . Edema 02/09/2013  . Hypertension 10/24/2011  . Coronary artery disease 10/24/2011  . Hyperlipidemia 10/24/2011  . DM (diabetes mellitus), type 2, uncontrolled with complications (HCC) 10/24/2011  . GERD (gastroesophageal reflux disease) 10/24/2011   Solon Palm PT 03/08/2019, 12:12 PM  [facility] [address] [place], [place], [address] Phone: [phone]   Fax:  [phone]  Name: SAVVA BEAMER MRN: [id number] Date of Birth: 07-08-1945

## 2019-03-10 ENCOUNTER — Encounter (INDEPENDENT_AMBULATORY_CARE_PROVIDER_SITE_OTHER): Payer: Self-pay

## 2019-03-10 ENCOUNTER — Encounter: Payer: Self-pay | Admitting: Physical Therapy

## 2019-03-10 ENCOUNTER — Ambulatory Visit (INDEPENDENT_AMBULATORY_CARE_PROVIDER_SITE_OTHER): Payer: Medicare HMO | Admitting: Physical Therapy

## 2019-03-10 ENCOUNTER — Other Ambulatory Visit: Payer: Self-pay

## 2019-03-10 DIAGNOSIS — R2681 Unsteadiness on feet: Secondary | ICD-10-CM | POA: Diagnosis not present

## 2019-03-10 DIAGNOSIS — M25612 Stiffness of left shoulder, not elsewhere classified: Secondary | ICD-10-CM | POA: Diagnosis not present

## 2019-03-10 DIAGNOSIS — R2689 Other abnormalities of gait and mobility: Secondary | ICD-10-CM

## 2019-03-10 NOTE — Patient Instructions (Signed)
Access Code: [email or web address]  URL: [email or web address]  Date: 03/10/2019  Prepared by: Mayer Camel   Exercises  Supine Shoulder Flexion Extension AAROM with Dowel - 10 reps - 5 seconds hold - 1-2x daily - 7x weekly  Supine Shoulder External Rotation with Dowel - 5 reps - 10 sseconds hold - 2x daily - 7x weekly  Supine Chest Stretch with Elbows Bent - 3-5 reps - 1 sets - 15 hold - 2x daily - 7x weekly  Standing Shoulder Extension with Dowel - 10 reps - 1 sets - 2x daily - 7x weekly  Standing Bilateral Shoulder Internal Rotation AAROM with Dowel - 10 reps - 1 sets - 3-5 hold - 2x daily - 7x weekly  Gastroc Stretch on Wall - 2 reps - 1 sets - 15 seconds hold - 1x daily - 7x weekly  Seated Hamstring Stretch - 2-3 reps - 1 sets - 15 seconds hold - 1x daily - 7x weekly  Standing Hip Extension - 5-10 reps - 1 sets - 1x daily - 7x weekly  Sit to Stand - 5 reps - 1 sets - 1-2x daily - 7x weekly  Shoulder External Rotation and Scapular Retraction with Resistance - 10 reps - 2 sets - 1x daily - 7x weekly  Standing Single Leg Stance with Counter Support - 3 reps - 1 sets - 10 seconds hold - 1x daily - 7x weekly  Tandem Stance - 10 reps - 3 sets - 1x daily - 7x weekly  Backward Walking with Counter Support - 10 reps - 2 sets - 1x daily - 7x weekly  Side Stepping with Counter Support - 10 reps - 1 sets - 1x daily - 7x weekly

## 2019-03-10 NOTE — Therapy (Signed)
[facility] [place] [address] [place], [place], [address] Phone: [phone]   Fax:  [phone]  Physical Therapy Treatment  Patient Details  Name: James [name] MRN: [id number] Date of Birth: December 06, 1945 Referring Provider (PT): Marcellus Scott, MD   Encounter Date: 03/10/2019  PT End of Session - 03/10/19 1145    Visit Number  5    Number of Visits  12    Date for PT Re-Evaluation  04/05/19    Authorization Type  [employer] MCR    PT Start Time  1101    PT Stop Time  1141    PT Time Calculation (min)  40 min    Activity Tolerance  Patient tolerated treatment well    Behavior During Therapy  [facility] for tasks assessed/performed       Past Medical History:  Diagnosis Date  . Abnormal EKG 03/09/2013  . Abnormal nuclear cardiac imaging test 11/14/2013  . Acute lacunar infarction (HCC) 02/06/2019  . Aortic atherosclerosis (HCC) 07/07/2017  . Arthritis    "all over" (09/30/2017)  . Ascending aortic aneurysm (HCC)    cMRI (2/15): Normal LV EF 54%, Mild BAE, Trileaflet Aortic Valve, Upper limits of normal ascending aorta 3.8 cm, Normal aortic arch 2.3 cm with bovine origin of left carotid  . Atrial flutter (HCC)    Post-op with no reoccurence  . Chronic diastolic heart failure (HCC)    Echo (1/15): Left ventricle: The cavity size was mildly dilated. Mild LVH. EF 55% to 60%. Wall motion was normal; Gr 1 diastolic dysfunction Left atrium: The atrium was mildly dilated. Right atrium: The atrium was mildly to moderately dilated. Aortic arch measures 4.8 cm (moderately dilated); suggest CTA or MRA to better assess.  . Chronic systolic CHF (congestive heart failure) (HCC) 07/24/2017  . CKD (chronic kidney disease), stage III 07/24/2017  . Coronary artery disease 10/2011   a. s/p cabg;  b. Myoview (9/15):  ant ischemia, poss TID, EF 43%, high risk >> LHC (9/15): Dist LM 50, pLAD 90 then 100, pCFX 50, mCFX 75, oRCA 75, dRCA 95, L-LAD patent, S-Dx  100, S-OM1 patent, S-PDA patent, EF 55% >> native Dx small caliber and not well suited for PCI; native RCA amenable to PCI and would restore flow to PLA branches but would jeopardize SVG-RCA - Med Rx  . Deviated nasal septum   . Dilated aortic root (HCC)   . Diverticulitis of sigmoid colon 07/24/2017  . DM (diabetes mellitus), type 2, uncontrolled with complications (HCC)    onset 2013  . Dyslipidemia   . Edema 02/09/2013  . GERD (gastroesophageal reflux disease)   . Hx of Doppler ultrasound    Carotid US (9/13): No ICA stenosis  . Hypertension   . Pleural thickening 07/24/2017    Past Surgical History:  Procedure Laterality Date  . CORONARY ARTERY BYPASS GRAFT  10/27/2011   Procedure: CORONARY ARTERY BYPASS GRAFTING (CABG);  Surgeon: Kerin Perna, MD;  Location: [facility] OR;  Service: Open Heart Surgery;  Laterality: N/A;  Coronary Artery Bypass Grafting times four using left internal mammary artery and right greater saphenous vein endoscopically harvested  . CORONARY STENT INTERVENTION N/A 09/30/2017   Procedure: CORONARY STENT INTERVENTION;  Surgeon: Corky Crafts, MD;  Location: [facility] INVASIVE CV LAB;  Service: Cardiovascular;  Laterality: N/A;  . KNEE ARTHROSCOPY Left 2003  . KNEE ARTHROSCOPY Right 2006  . LEFT HEART CATHETERIZATION WITH CORONARY ANGIOGRAM N/A 10/23/2011   Procedure: LEFT HEART CATHETERIZATION WITH CORONARY  ANGIOGRAM;  Surgeon: Quintella Reichert, MD;  Location: [facility] CATH LAB;  Service: Cardiovascular;  Laterality: N/A;  . LEFT HEART CATHETERIZATION WITH CORONARY ANGIOGRAM N/A 11/16/2013   Procedure: LEFT HEART CATHETERIZATION WITH CORONARY ANGIOGRAM;  Surgeon: Micheline Chapman, MD;  Location: [facility] CATH LAB;  Service: Cardiovascular;  Laterality: N/A;  . RIGHT/LEFT HEART CATH AND CORONARY/GRAFT ANGIOGRAPHY N/A 09/30/2017   Procedure: RIGHT/LEFT HEART CATH AND CORONARY/GRAFT ANGIOGRAPHY;  Surgeon: Corky Crafts, MD;  Location: [facility] INVASIVE CV LAB;  Service: Cardiovascular;   Laterality: N/A;    There were no vitals filed for this visit.  Subjective Assessment - 03/10/19 1107    Subjective  Pt reports he feels his cane is more of a neusance now; hasn't been using it the last couple days. He would like to continue working on his balance.  His Lt shoulder only hurts when reaching forward or overhead.    Pertinent History  TIA December 2020, CAD, DM, HTN, arthritis, bypass surgery 2013, bil knee arthroscopies    Patient Stated Goals  less pain in shoulder    Currently in Pain?  No/denies    Pain Score  --   up to 4/10 with reaching, no pain at rest   Pain Location  Shoulder    Pain Orientation  Left    Pain Descriptors / Indicators  Sore         [facility] PT Assessment - 03/10/19 0001      Assessment   Medical Diagnosis  Left sided numbness    Referring Provider (PT)  Marcellus Scott, MD    Onset Date/Surgical Date  01/02/19    Hand Dominance  Right       [facility] Adult PT Treatment/Exercise - 03/10/19 0001      Lumbar Exercises: Stretches   Passive Hamstring Stretch  Right;Left;3 reps;20 seconds   seated     Shoulder Exercises: Seated   Row  Both;12 reps;Strengthening    Theraband Level (Shoulder Row)  Level 2 (Red)    External Rotation  Both;Strengthening;12 reps    Theraband Level (Shoulder External Rotation)  Level 2 (Red)      Shoulder Exercises: Standing   Other Standing Exercises  sliding arms up/down on cabinets for ROM and strength x 8 reps      Balance Exercises - 03/10/19 1132      Balance Exercises: Standing   Standing Eyes Opened  Narrow base of support (BOS);Foam/compliant surface;2 reps    Standing Eyes Closed  Foam/compliant surface;2 reps;20 secs    Tandem Stance  Eyes open;Intermittent upper extremity support;2 reps;15 secs    SLS  Eyes open;3 reps;10 secs;Upper extremity support 2;Solid surface    Stepping Strategy  Anterior;Posterior;Lateral;Foam/compliant surface;5 reps   3" foam; intermittent UE; 5 reps each leg   Tandem  Gait  Forward;Retro;Intermittent upper extremity support;2 reps    Retro Gait  4 reps    Sidestepping  2 reps   with increased step height.          PT Short Term Goals - 03/08/19 1211      PT SHORT TERM GOAL #1   Title  Independent with Initial HEP    Status  Achieved        PT Long Term Goals - 03/08/19 1156      PT LONG TERM GOAL #1   Title  Independent with HEP for strengthening and flexibility to increase function.    Status  On-going      PT LONG TERM GOAL #  2   Title  Improved left shoulder ROM to [facility] to perform normal ADLS with 3/26 pain or less.    Baseline  some pain with IR and ABD today    Status  On-going      PT LONG TERM GOAL #3   Title  Improved left shoulder strength to 5/5 to normalize ADLS    Status  On-going      PT LONG TERM GOAL #7   Title  Improved score on BERG to 52/56 to decrease fall risk.    Status  New    Target Date  04/05/19            Plan - 03/10/19 1146    Clinical Impression Statement  Pt demonstrated decreased balance with static and dynamic activities with Lt single leg stance.  Pt challenged with with narrow BOS and normal stance with EC on compliant surface.  Pt  would benefit work on stepping strategy.  Progressing towards goals.    Comorbidities  TIA December 2020, CAD, DM, HTN, arthritis, bypass surgery 2013, bil knee arthroscopies    Rehab Potential  Good    PT Frequency  2x / week    PT Duration  8 weeks    PT Treatment/Interventions  ADLs/Self Care Home Management;Electrical Stimulation;Iontophoresis 4mg /ml Dexamethasone;Moist Heat;Therapeutic exercise;Balance training;Neuromuscular re-education;Patient/family education;Manual techniques;Dry needling;Passive range of motion;Joint Manipulations;Cryotherapy;Ultrasound;Gait training    PT Next Visit Plan  continue work towards being able to get out of recliner without getting onto knees and pushing up with arms, continue shoulder strengthening/ROM; balance exercises.    PT  Home Exercise Plan  [email or web address]    Consulted and Agree with Plan of Care  Patient       Patient will benefit from skilled therapeutic intervention in order to improve the following deficits and impairments:  Abnormal gait, Decreased balance, Pain, Postural dysfunction, Impaired flexibility, Decreased strength, Decreased activity tolerance, Decreased range of motion, Impaired UE functional use, Impaired sensation  Visit Diagnosis: Stiffness of left shoulder, not elsewhere classified  Unsteadiness on feet  Other abnormalities of gait and mobility     Problem List Patient Active Problem List   Diagnosis Date Noted  . Left sided numbness 02/06/2019  . Acute lacunar infarction ([place]) 02/06/2019  . Obesity, Class II, BMI 35-39.9 02/06/2019  . Dyslipidemia   . Ischemic cardiomyopathy 10/12/2017  . Mild pulmonary hypertension ([place]) 10/12/2017  . S/P CABG (coronary artery bypass graft) 10/01/2017  . Status post coronary artery stent placement   . Left ventricular systolic dysfunction 98/33/8250  . Diverticulitis of sigmoid colon 07/24/2017  . Chronic systolic CHF (congestive heart failure) ([place]) 07/24/2017  . CKD (chronic kidney disease), stage III 07/24/2017  . Pleural thickening 07/24/2017  . Aortic atherosclerosis ([place]) 07/07/2017  . Abnormal nuclear cardiac imaging test 11/14/2013  . Abnormal EKG 03/09/2013  . Dilated aortic root ([place])   . Edema 02/09/2013  . Hypertension 10/24/2011  . Coronary artery disease 10/24/2011  . Hyperlipidemia 10/24/2011  . DM (diabetes mellitus), type 2, uncontrolled with complications ([place]) 53/97/6734  . GERD (gastroesophageal reflux disease) 10/24/2011   Kerin Perna, PTA 03/10/19 1:24 PM  [facility] [place] [address] [place], [place], [address] Phone: [phone]   Fax:  [phone]  Name: MARCELLO TUZZOLINO MRN: [id number] Date of Birth: 09-14-1945

## 2019-03-11 ENCOUNTER — Other Ambulatory Visit: Payer: Self-pay | Admitting: Medical

## 2019-03-11 ENCOUNTER — Other Ambulatory Visit: Payer: Self-pay | Admitting: Physician Assistant

## 2019-03-11 MED FILL — ATORVASTATIN 80 MG TABLET: 80 | 30 days supply | Qty: 30 | Fill #0

## 2019-03-11 MED FILL — OZEMPIC 1 MG/DOSE SOPN: 2 | 28 days supply | Qty: 3 | Fill #0

## 2019-03-11 MED FILL — BD PEN NDL SHORT 31GX5/16: 31G X 8 MM | 50 days supply | Qty: 100 | Fill #0

## 2019-03-11 MED FILL — POTASSIUM CL ER 10 MEQ TAB: 10 | 30 days supply | Qty: 30 | Fill #0

## 2019-03-11 MED FILL — BD PEN NDL SHORT 31GX5/16": 31G X 8 MM | 50 days supply | Qty: 100 | Fill #0

## 2019-03-11 MED FILL — FREESTYLE LIBRE 14 DAY SENS: 28 days supply | Qty: 2 | Fill #0

## 2019-03-15 ENCOUNTER — Encounter: Payer: Self-pay | Admitting: Physical Therapy

## 2019-03-15 ENCOUNTER — Ambulatory Visit: Payer: Medicare HMO | Admitting: Physical Therapy

## 2019-03-15 ENCOUNTER — Other Ambulatory Visit (HOSPITAL_COMMUNITY): Payer: Medicare HMO

## 2019-03-15 ENCOUNTER — Other Ambulatory Visit: Payer: Self-pay

## 2019-03-15 DIAGNOSIS — M25612 Stiffness of left shoulder, not elsewhere classified: Secondary | ICD-10-CM

## 2019-03-15 DIAGNOSIS — M25512 Pain in left shoulder: Secondary | ICD-10-CM | POA: Diagnosis not present

## 2019-03-15 DIAGNOSIS — R2681 Unsteadiness on feet: Secondary | ICD-10-CM | POA: Diagnosis not present

## 2019-03-15 DIAGNOSIS — R2689 Other abnormalities of gait and mobility: Secondary | ICD-10-CM | POA: Diagnosis not present

## 2019-03-15 NOTE — Therapy (Signed)
[place] [address] [place], [place], [address] Phone: [phone]   Fax:  [phone]  Physical Therapy Treatment  Patient Details  Name: James [name] MRN: [id number] Date of Birth: May 26, 1945 Referring Provider (PT): Vernell Leep, MD   Encounter Date: 03/15/2019  PT End of Session - 03/15/19 1100    Visit Number  6    Number of Visits  12    Date for PT Re-Evaluation  04/05/19    Authorization Type  [employer] MCR    PT Start Time  1100    PT Stop Time  1146    PT Time Calculation (min)  46 min    Activity Tolerance  Patient tolerated treatment well    Behavior During Therapy  [facility] for tasks assessed/performed       Past Medical History:  Diagnosis Date  . Abnormal EKG 03/09/2013  . Abnormal nuclear cardiac imaging test 11/14/2013  . Acute lacunar infarction ([place]) 02/06/2019  . Aortic atherosclerosis ([place]) 07/07/2017  . Arthritis    "all over" (09/30/2017)  . Ascending aortic aneurysm ([place])    cMRI (2/15): Normal LV EF 54%, Mild BAE, Trileaflet Aortic Valve, Upper limits of normal ascending aorta 3.8 cm, Normal aortic arch 2.3 cm with bovine origin of left carotid  . Atrial flutter (HCC)    Post-op with no reoccurence  . Chronic diastolic heart failure ([place])    Echo (1/15): Left ventricle: The cavity size was mildly dilated. Mild LVH. EF 55% to 60%. Wall motion was normal; Gr 1 diastolic dysfunction Left atrium: The atrium was mildly dilated. Right atrium: The atrium was mildly to moderately dilated. Aortic arch measures 4.8 cm (moderately dilated); suggest CTA or MRA to better assess.  . Chronic systolic CHF (congestive heart failure) ([place]) 07/24/2017  . CKD (chronic kidney disease), stage III 07/24/2017  . Coronary artery disease 10/2011   a. s/p cabg;  b. Myoview (9/15):  ant ischemia, poss TID, EF 43%, high risk >> LHC (9/15): Dist LM 50, pLAD 90 then 100, pCFX 50, mCFX 75, oRCA 75, dRCA 95, L-LAD patent, S-Dx  100, S-OM1 patent, S-PDA patent, EF 55% >> native Dx small caliber and not well suited for PCI; native RCA amenable to PCI and would restore flow to PLA branches but would jeopardize SVG-RCA - Med Rx  . Deviated nasal septum   . Dilated aortic root ([place])   . Diverticulitis of sigmoid colon 07/24/2017  . DM (diabetes mellitus), type 2, uncontrolled with complications ([place])    onset 2013  . Dyslipidemia   . Edema 02/09/2013  . GERD (gastroesophageal reflux disease)   . Hx of Doppler ultrasound    Carotid US (9/13): No ICA stenosis  . Hypertension   . Pleural thickening 07/24/2017    Past Surgical History:  Procedure Laterality Date  . CORONARY ARTERY BYPASS GRAFT  10/27/2011   Procedure: CORONARY ARTERY BYPASS GRAFTING (CABG);  Surgeon: Ivin Poot, MD;  Location: [place];  Service: Open Heart Surgery;  Laterality: N/A;  Coronary Artery Bypass Grafting times four using left internal mammary artery and right greater saphenous vein endoscopically harvested  . CORONARY STENT INTERVENTION N/A 09/30/2017   Procedure: CORONARY STENT INTERVENTION;  Surgeon: Jettie Booze, MD;  Location: [place] CV [facility];  Service: Cardiovascular;  Laterality: N/A;  . KNEE ARTHROSCOPY Left 2003  . KNEE ARTHROSCOPY Right 2006  . LEFT HEART CATHETERIZATION WITH CORONARY ANGIOGRAM N/A 10/23/2011   Procedure: LEFT HEART CATHETERIZATION WITH CORONARY  ANGIOGRAM;  Surgeon: Sueanne Margarita, MD;  [facility] C[facility];  Service: Cardiovascular;  Laterality: N/A;  . LEFT HEART CATHETERIZATION WITH CORONARY ANGIOGRAM N/A 11/16/2013   Procedure: LEFT HEART CATHETERIZATION WITH CORONARY ANGIOGRAM;  Surgeon: Blane Ohara, MD;  Location: [facility] CATH LAB;  Service: Cardiovascular;  Laterality: N/A;  . RIGHT/LEFT HEART CATH AND CORONARY/GRAFT ANGIOGRAPHY N/A 09/30/2017   Procedure: RIGHT/LEFT HEART CATH AND CORONARY/GRAFT ANGIOGRAPHY;  Surgeon: Jettie Booze, MD;  Location: [place] CV LAB;  Service: Cardiovascular;   Laterality: N/A;    There were no vitals filed for this visit.      [facility] PT Assessment - 03/15/19 0001      Strength   Left Shoulder Flexion  4+/5    Left Shoulder Extension  5/5    Left Shoulder ABduction  4/5    Left Shoulder Internal Rotation  5/5    Left Shoulder External Rotation  4+/5                   [facility] Adult PT Treatment/Exercise - 03/15/19 0001      Knee/Hip Exercises: Seated   Sit to Sand  5 reps;without UE support      Shoulder Exercises: Supine   Flexion  Left;10 reps    ABduction Limitations  still painful in supine    Other Supine Exercises  scaption 1# x 10 to 90 deg no pain      Shoulder Exercises: Sidelying   Other Sidelying Exercises  horizontal ABD x 10 no wt; then 1# with t.       Shoulder Exercises: Standing   Flexion  Strengthening;Left;Weights;5 reps    Shoulder Flexion Weight (lbs)  1    Flexion Limitations  elbow bends    ABduction  Left;10 reps    Shoulder ABduction Weight (lbs)  1    ABduction Limitations  stopping before pain (less than 90 deg)    Other Standing Exercises  sliding arms up/down on cabinets for ROM and strength x 10 reps          Balance Exercises - 03/15/19 1131      Balance Exercises: Standing   Standing Eyes Opened  Narrow base of support (BOS);Foam/compliant surface   head turns x 5 each way   SLS  Eyes open;Solid surface;Upper extremity support 1;3 reps    Step Ups  Forward;Lateral;2 inch   onto foam x 8 bil   Other Standing Exercises  standing with toes on 1/2 foam to challenge center of gravity; look ups x 3 pt did well          PT Short Term Goals - 03/08/19 1211      PT SHORT TERM GOAL #1   Title  Independent with Initial HEP    Status  Achieved        PT Long Term Goals - 03/15/19 1101      PT LONG TERM GOAL #1   Title  Independent with HEP for strengthening and flexibility to increase function.    Status  On-going      PT LONG TERM GOAL #2   Title  Improved left shoulder  ROM to [facility] to perform normal ADLS with 3/00 pain or less.    Baseline  WFL with pain of 6/10 with ABD    Status  Partially Met      PT LONG TERM GOAL #3   Title  Improved left shoulder strength to 5/5 to normalize ADLS    Status  On-going            Plan - 03/15/19 1240    Clinical Impression Statement  Patient continues to have difficulties and LOB with balance activities, but gets stronger as exercises progress. He did require short rest breaks intemittently with steps ups Difficulty with activities requiring SLS on left.    PT Treatment/Interventions  ADLs/Self Care Home Management;Electrical Stimulation;Iontophoresis 67m/ml Dexamethasone;Moist Heat;Therapeutic exercise;Balance training;Neuromuscular re-education;Patient/family education;Manual techniques;Dry needling;Passive range of motion;Joint Manipulations;Cryotherapy;Ultrasound;Gait training    PT Next Visit Plan  balance exercises including steppiing strategy, continue work towards being able to get out of recliner without getting onto knees and pushing up with arms, continue shoulder strengthening/ROM;    PT [place]      Patient will benefit from skilled therapeutic intervention in order to improve the following deficits and impairments:  Abnormal gait, Decreased balance, Pain, Postural dysfunction, Impaired flexibility, Decreased strength, Decreased activity tolerance, Decreased range of motion, Impaired UE functional use, Impaired sensation  Visit Diagnosis: Unsteadiness on feet  Stiffness of left shoulder, not elsewhere classified  Other abnormalities of gait and mobility  Acute pain of left shoulder     Problem List Patient Active Problem List   Diagnosis Date Noted  . Left sided numbness 02/06/2019  . Acute [place]unar infarction ([place] 02/06/2019  . Obesity, Class II, BMI 35-39.9 02/06/2019  . Dyslipidemia   . Ischemic cardiomyopathy 10/12/2017  . Mild pulmonary hypertension ([place]  10/12/2017  . S/P CABG (coronary artery bypass graft) 10/01/2017  . Status post coronary artery stent placement   . Left ventricular systolic dysfunction 060/73/7106 . Diverticulitis of sigmoid colon 07/24/2017  . Chronic systolic CHF (congestive heart failure) ([place] 07/24/2017  . CKD (chronic kidney disease), stage III 07/24/2017  . Pleural thickening 07/24/2017  . Aortic atherosclerosis ([place] 07/07/2017  . Abnormal nuclear cardiac imaging test 11/14/2013  . Abnormal EKG 03/09/2013  . Dilated aortic root ([place]   . Edema 02/09/2013  . Hypertension 10/24/2011  . Coronary artery disease 10/24/2011  . Hyperlipidemia 10/24/2011  . DM (diabetes mellitus), type 2, uncontrolled with complications ([place] 026/94/8546 . GERD (gastroesophageal reflux disease) 10/24/2011    JMadelyn FlavorsPT 03/15/2019, 12:46 PM  [facility] [place] [address]Phone: [phone]  Fax:  [phone] Name: TJULUS KELLEYMRN: [id number] of Birth: 419-Dec-1947

## 2019-03-17 ENCOUNTER — Encounter (HOSPITAL_BASED_OUTPATIENT_CLINIC_OR_DEPARTMENT_OTHER): Payer: Medicare HMO | Admitting: Cardiology

## 2019-03-17 ENCOUNTER — Encounter: Payer: Medicare HMO | Admitting: Physical Therapy

## 2019-03-18 ENCOUNTER — Ambulatory Visit: Payer: Medicare HMO | Admitting: Physical Therapy

## 2019-03-18 ENCOUNTER — Other Ambulatory Visit: Payer: Self-pay

## 2019-03-18 DIAGNOSIS — M25512 Pain in left shoulder: Secondary | ICD-10-CM

## 2019-03-18 DIAGNOSIS — R2681 Unsteadiness on feet: Secondary | ICD-10-CM

## 2019-03-18 DIAGNOSIS — R2689 Other abnormalities of gait and mobility: Secondary | ICD-10-CM | POA: Diagnosis not present

## 2019-03-18 DIAGNOSIS — M25612 Stiffness of left shoulder, not elsewhere classified: Secondary | ICD-10-CM | POA: Diagnosis not present

## 2019-03-18 NOTE — Therapy (Signed)
[place], [place], [address] Phone: [phone]   Fax:  [phone]  Physical Therapy Treatment  Patient Details  Name: James [name] MRN: [id number] Date of Birth: [birth year] Referring Provider (PT): Vernell Leep, MD   Encounter Date: 03/18/2019  PT End of Session - 03/18/19 1336    Visit Number  7    Number of Visits  12    Date for PT Re-Evaluation  04/05/19    Authorization Type  [employer] MCR    PT Start Time  1151    PT Stop Time  1232    PT Time Calculation (min)  41 min    Activity Tolerance  Patient tolerated treatment well    Behavior During Therapy  [facility] for tasks assessed/performed       Past Medical History:  Diagnosis Date  . Abnormal EKG 03/09/2013  . Abnormal nuclear cardiac imaging test 11/14/2013  . Acute lacunar infarction ([place]) 02/06/2019  . Aortic atherosclerosis ([place]) 07/07/2017  . Arthritis    "all over" (09/30/2017)  . Ascending aortic aneurysm ([place])    cMRI (2/15): Normal LV EF 54%, Mild BAE, Trileaflet Aortic Valve, Upper limits of normal ascending aorta 3.8 cm, Normal aortic arch 2.3 cm with bovine origin of left carotid  . Atrial flutter (HCC)    Post-op with no reoccurence  . Chronic diastolic heart failure ([place])    Echo (1/15): Left ventricle: The cavity size was mildly dilated. Mild LVH. EF 55% to 60%. Wall motion was normal; Gr 1 diastolic dysfunction Left atrium: The atrium was mildly dilated. Right atrium: The atrium was mildly to moderately dilated. Aortic arch measures 4.8 cm (moderately dilated); suggest CTA or MRA to better assess.  . Chronic systolic CHF (congestive heart failure) ([place]) 07/24/2017  . CKD (chronic kidney disease), stage III 07/24/2017  . Coronary artery disease 10/2011   a. s/p cabg;  b. Myoview (9/15):  ant ischemia, poss TID, EF 43%, high risk >> LHC (9/15): Dist LM 50, pLAD 90 then 100, pCFX 50, mCFX 75, oRCA 75, dRCA 95, L-LAD patent, S-Dx  100, S-OM1 patent, S-PDA patent, EF 55% >> native Dx small caliber and not well suited for PCI; native RCA amenable to PCI and would restore flow to PLA branches but would jeopardize SVG-RCA - Med Rx  . Deviated nasal septum   . Dilated aortic root ([place])   . Diverticulitis of sigmoid colon 07/24/2017  . DM (diabetes mellitus), type 2, uncontrolled with complications ([place])    onset 2013  . Dyslipidemia   . Edema 02/09/2013  . GERD (gastroesophageal reflux disease)   . Hx of Doppler ultrasound    Carotid US (9/13): No ICA stenosis  . Hypertension   . Pleural thickening 07/24/2017    Past Surgical History:  Procedure Laterality Date  . CORONARY ARTERY BYPASS GRAFT  10/27/2011   Procedure: CORONARY ARTERY BYPASS GRAFTING (CABG);  Surgeon: Ivin Poot, MD;  Location: [facility];  Service: Open Heart Surgery;  Laterality: N/A;  Coronary Artery Bypass Grafting times four using left internal mammary artery and right greater saphenous vein endoscopically harvested  . CORONARY STENT INTERVENTION N/A 09/30/2017   Procedure: CORONARY STENT INTERVENTION;  Surgeon: Jettie Booze, MD;  Location: [place] [facility];  Service: Cardiovascular;  Laterality: N/A;  . KNEE ARTHROSCOPY Left 2003  . KNEE ARTHROSCOPY Right 2006  . LEFT HEART CATHETERIZATION WITH CORONARY ANGIOGRAM N/A 10/23/2011   Procedure: LEFT HEART CATHETERIZATION WITH CORONARY  ANGIOGRAM;  Surgeon: Sueanne Margarita, MD;  [facility];  Service: Cardiovascular;  Laterality: N/A;  . LEFT HEART CATHETERIZATION WITH CORONARY ANGIOGRAM N/A 11/16/2013   Procedure: LEFT HEART CATHETERIZATION WITH CORONARY ANGIOGRAM;  Surgeon: Blane Ohara, MD;  Location: [facility] CATH LAB;  Service: Cardiovascular;  Laterality: N/A;  . RIGHT/LEFT HEART CATH AND CORONARY/GRAFT ANGIOGRAPHY N/A 09/30/2017   Procedure: RIGHT/LEFT HEART CATH AND CORONARY/GRAFT ANGIOGRAPHY;  Surgeon: Jettie Booze, MD;  Location: [place] CV LAB;  Service: Cardiovascular;   Laterality: N/A;    There were no vitals filed for this visit.  Subjective Assessment - 03/18/19 1203    Subjective  Pt reports his Lt shoulder is hurting less these days.  He still thinks his balance is off.  He has noticed stairs are getting easier, but has to focus on lifting feet heigh enough.    Currently in Pain?  Yes    Pain Score  1     Pain Location  Shoulder    Pain Orientation  Left    Pain Descriptors / Indicators  Sore    Aggravating Factors   reaching overhead    Pain Relieving Factors  rest         [facility] PT Assessment - 03/18/19 0001      Assessment   Medical Diagnosis  Left sided numbness    Referring Provider (PT)  Vernell Leep, MD    Onset Date/Surgical Date  01/02/19    Hand Dominance  Right        [facility] Adult PT Treatment/Exercise - 03/18/19 0001      Knee/Hip Exercises: Seated   Marching  1 set;10 reps      Shoulder Exercises: Seated   Row  Both;10 reps    Theraband Level (Shoulder Row)  Level 3 (Green)    Flexion  Strengthening;Left;10 reps    Flexion Weight (lbs)  1,2    to 90 deg   Abduction  Left;10 reps    ABduction Weight (lbs)  1,2    to 90 deg   Other Seated Exercises  elbow flexion LUE x 15 with 4#       Shoulder Exercises: Standing   Flexion  Strengthening;Left;Weights;5 reps    ABduction  Left;10 reps      Shoulder Exercises: Pulleys   Flexion  3 minutes      Balance Exercises - 03/18/19 1217      Balance Exercises: Standing   Standing Eyes Opened  Narrow base of support (BOS);Foam/compliant surface;Head turns;2 reps    Standing Eyes Closed  Foam/compliant surface;2 reps;10 secs   CGA due to sway   Tandem Stance  Eyes open;15 secs;1 rep;Intermittent upper extremity support    SLS  Eyes open;Solid surface;Upper extremity support 1;2 reps;10 secs    Stepping Strategy  Lateral;Foam/compliant surface;5 reps   3" foam   Retro Gait  2 reps   cues to take bigger step with RLE   Sidestepping  4 reps   with increased step  height, intermittent UE to steady         PT Short Term Goals - 03/18/19 1323      PT SHORT TERM GOAL #1   Title  Independent with Initial HEP    Status  Achieved      PT SHORT TERM GOAL #2   Title  decreased pain in left shoulder with ADLS by 09%    Time  4    Period  Weeks    Status  Achieved        PT Long Term Goals - 03/18/19 1323      PT LONG TERM GOAL #1   Title  Independent with HEP for strengthening and flexibility to increase function.    Status  On-going      PT LONG TERM GOAL #2   Title  Improved left shoulder ROM to [facility] to perform normal ADLS with 4/62 pain or less.    Baseline  WFL with pain of 6/10 with ABD    Status  Partially Met      PT LONG TERM GOAL #3   Title  Improved left shoulder strength to 5/5 to normalize ADLS    Status  On-going      PT LONG TERM GOAL #4   Title  Patient able to safely amb 500 feet with least restrictive AD and without verbal cueing for correct form.    Time  8    Period  Weeks    Status  On-going      PT LONG TERM GOAL #5   Title  FOTO limitations <= 34%    Status  On-going      PT LONG TERM GOAL #6   Title  Deceased N/T in left side by 50% or more.    Period  Weeks    Status  On-going      PT LONG TERM GOAL #7   Title  Improved score on BERG to 52/56 to decrease fall risk.    Status  On-going            Plan - 03/18/19 1320    Clinical Impression Statement  Pt reporting improved ability to stand up from recliner and stairs.  He was able to tolerate increased resistance wth LUE exercises; kept below 90 deg to decrease pain occurance.  Pt requires short seated rest breaks intermittently after standing exercises.  Tandem stance and SLS exercises continue to be challenging; retro walking demonstrated with improved balance. Pt verbalized interest in more focus on balance in future visits.    Comorbidities  TIA December 2020, CAD, DM, HTN, arthritis, bypass surgery 2013, bil knee arthroscopies    PT Frequency   2x / week    PT Duration  8 weeks    PT Treatment/Interventions  ADLs/Self Care Home Management;Electrical Stimulation;Iontophoresis 19m/ml Dexamethasone;Moist Heat;Therapeutic exercise;Balance training;Neuromuscular re-education;Patient/family education;Manual techniques;Dry needling;Passive range of motion;Joint Manipulations;Cryotherapy;Ultrasound;Gait training    PT Next Visit Plan  balance exercises including steppiing strategy, continue shoulder strengthening/ROM;    PT Home Exercise Plan  [id number]      Patient will benefit from skilled therapeutic intervention in order to improve the following deficits and impairments:  Abnormal gait, Decreased balance, Pain, Postural dysfunction, Impaired flexibility, Decreased strength, Decreased activity tolerance, Decreased range of motion, Impaired UE functional use, Impaired sensation  Visit Diagnosis: Unsteadiness on feet  Stiffness of left shoulder, not elsewhere classified  Other abnormalities of gait and mobility  Acute pain of left shoulder     Problem List Patient Active Problem List   Diagnosis Date Noted  . Left sided numbness 02/06/2019  . Acute lacunar infarction ([place] 02/06/2019  . Obesity, Class II, BMI 35-39.9 02/06/2019  . Dyslipidemia   . Ischemic cardiomyopathy 10/12/2017  . Mild pulmonary hypertension ([place] 10/12/2017  . S/P CABG (coronary artery bypass graft) 10/01/2017  . Status post coronary artery stent placement   . Left ventricular systolic dysfunction 081/82/9937 . Diverticulitis of sigmoid colon 07/24/2017  . Chronic systolic CHF (  congestive heart failure) ([place]) 07/24/2017  . CKD (chronic kidney disease), stage III 07/24/2017  . Pleural thickening 07/24/2017  . Aortic atherosclerosis ([place]) 07/07/2017  . Abnormal nuclear cardiac imaging test 11/14/2013  . Abnormal EKG 03/09/2013  . Dilated aortic root ([place])   . Edema 02/09/2013  . Hypertension 10/24/2011  . Coronary artery disease 10/24/2011  .  Hyperlipidemia 10/24/2011  . DM (diabetes mellitus), type 2, uncontrolled with complications ([place]) 79/89/2119  . GERD (gastroesophageal reflux disease) 10/24/2011   James [name], PTA 03/18/19 1:38 PM  [facility] [place], [place], [address] Phone: [phone]   Fax:  [phone]  Name: James [name] MRN: [id number] Date of Birth: 10/31/1945

## 2019-03-22 ENCOUNTER — Other Ambulatory Visit: Payer: Self-pay

## 2019-03-22 ENCOUNTER — Ambulatory Visit (INDEPENDENT_AMBULATORY_CARE_PROVIDER_SITE_OTHER): Payer: Medicare HMO | Admitting: Physical Therapy

## 2019-03-22 DIAGNOSIS — M25612 Stiffness of left shoulder, not elsewhere classified: Secondary | ICD-10-CM

## 2019-03-22 DIAGNOSIS — E11319 Type 2 diabetes mellitus with unspecified diabetic retinopathy without macular edema: Secondary | ICD-10-CM | POA: Diagnosis not present

## 2019-03-22 DIAGNOSIS — R2681 Unsteadiness on feet: Secondary | ICD-10-CM

## 2019-03-22 DIAGNOSIS — E1165 Type 2 diabetes mellitus with hyperglycemia: Secondary | ICD-10-CM | POA: Diagnosis not present

## 2019-03-22 DIAGNOSIS — E119 Type 2 diabetes mellitus without complications: Secondary | ICD-10-CM | POA: Diagnosis not present

## 2019-03-22 DIAGNOSIS — I25810 Atherosclerosis of coronary artery bypass graft(s) without angina pectoris: Secondary | ICD-10-CM | POA: Diagnosis not present

## 2019-03-22 DIAGNOSIS — R2689 Other abnormalities of gait and mobility: Secondary | ICD-10-CM

## 2019-03-22 NOTE — Therapy (Signed)
[place], [place], [address] Phone: [phone]   Fax:  [phone]  Physical Therapy Treatment  Patient Details  Name: James [name] MRN: [id number] Date of Birth: [birth year] Referring Provider (PT): Vernell Leep, MD   Encounter Date: 03/22/2019  PT End of Session - 03/22/19 0109    Visit Number  8    Number of Visits  12    Date for PT Re-Evaluation  04/05/19    Authorization Type  [employer] MCR    PT Start Time  1151    PT Stop Time  1229    PT Time Calculation (min)  38 min    Activity Tolerance  Patient tolerated treatment well    Behavior During Therapy  [facility] for tasks assessed/performed       Past Medical History:  Diagnosis Date  . Abnormal EKG 03/09/2013  . Abnormal nuclear cardiac imaging test 11/14/2013  . Acute lacunar infarction ([place]) 02/06/2019  . Aortic atherosclerosis ([place]) 07/07/2017  . Arthritis    "all over" (09/30/2017)  . Ascending aortic aneurysm ([place])    cMRI (2/15): Normal LV EF 54%, Mild BAE, Trileaflet Aortic Valve, Upper limits of normal ascending aorta 3.8 cm, Normal aortic arch 2.3 cm with bovine origin of left carotid  . Atrial flutter (HCC)    Post-op with no reoccurence  . Chronic diastolic heart failure ([place])    Echo (1/15): Left ventricle: The cavity size was mildly dilated. Mild LVH. EF 55% to 60%. Wall motion was normal; Gr 1 diastolic dysfunction Left atrium: The atrium was mildly dilated. Right atrium: The atrium was mildly to moderately dilated. Aortic arch measures 4.8 cm (moderately dilated); suggest CTA or MRA to better assess.  . Chronic systolic CHF (congestive heart failure) ([place]) 07/24/2017  . CKD (chronic kidney disease), stage III 07/24/2017  . Coronary artery disease 10/2011   a. s/p cabg;  b. Myoview (9/15):  ant ischemia, poss TID, EF 43%, high risk >> LHC (9/15): Dist LM 50, pLAD 90 then 100, pCFX 50, mCFX 75, oRCA 75, dRCA 95, L-LAD patent, S-Dx  100, S-OM1 patent, S-PDA patent, EF 55% >> native Dx small caliber and not well suited for PCI; native RCA amenable to PCI and would restore flow to PLA branches but would jeopardize SVG-RCA - Med Rx  . Deviated nasal septum   . Dilated aortic root ([place])   . Diverticulitis of sigmoid colon 07/24/2017  . DM (diabetes mellitus), type 2, uncontrolled with complications ([place])    onset 2013  . Dyslipidemia   . Edema 02/09/2013  . GERD (gastroesophageal reflux disease)   . Hx of Doppler ultrasound    Carotid US (9/13): No ICA stenosis  . Hypertension   . Pleural thickening 07/24/2017    Past Surgical History:  Procedure Laterality Date  . CORONARY ARTERY BYPASS GRAFT  10/27/2011   Procedure: CORONARY ARTERY BYPASS GRAFTING (CABG);  Surgeon: Ivin Poot, MD;  Location: [place];  Service: Open Heart Surgery;  Laterality: N/A;  Coronary Artery Bypass Grafting times four using left internal mammary artery and right greater saphenous vein endoscopically harvested  . CORONARY STENT INTERVENTION N/A 09/30/2017   Procedure: CORONARY STENT INTERVENTION;  Surgeon: Jettie Booze, MD;  Location: [place] [facility];  Service: Cardiovascular;  Laterality: N/A;  . KNEE ARTHROSCOPY Left 2003  . KNEE ARTHROSCOPY Right 2006  . LEFT HEART CATHETERIZATION WITH CORONARY ANGIOGRAM N/A 10/23/2011   Procedure: LEFT HEART CATHETERIZATION WITH CORONARY  ANGIOGRAM;  Surgeon: Sueanne Margarita, MD;  Location: [facility] CATH LAB;  Service: Cardiovascular;  Laterality: N/A;  . LEFT HEART CATHETERIZATION WITH CORONARY ANGIOGRAM N/A 11/16/2013   Procedure: LEFT HEART CATHETERIZATION WITH CORONARY ANGIOGRAM;  Surgeon: Blane Ohara, MD;  Location: [facility] CATH LAB;  Service: Cardiovascular;  Laterality: N/A;  . RIGHT/LEFT HEART CATH AND CORONARY/GRAFT ANGIOGRAPHY N/A 09/30/2017   Procedure: RIGHT/LEFT HEART CATH AND CORONARY/GRAFT ANGIOGRAPHY;  Surgeon: Jettie Booze, MD;  Location: [place] [facility];  Service: Cardiovascular;   Laterality: N/A;    There were no vitals filed for this visit.  Subjective Assessment - 03/22/19 1249    Subjective  Pt voiced that he is pleased with current level of function of LUE and would like to focus on his balance. He reports he can now go up stairs with reciprocal pattern, however "I have to think about it".    Currently in Pain?  No/denies    Pain Score  0-No pain         [facility] PT Assessment - 03/22/19 0001      Assessment   Medical Diagnosis  Left sided numbness    Referring Provider (PT)  Vernell Leep, MD    Onset Date/Surgical Date  01/02/19    Hand Dominance  Right      Strength   Right/Left Shoulder  Left    Left Shoulder Flexion  5/5    Left Shoulder Extension  5/5    Left Shoulder ABduction  5/5   with pain    Left Shoulder Internal Rotation  5/5    Left Shoulder External Rotation  4+/5       [facility] Adult PT Treatment/Exercise - 03/22/19 0001      Shoulder Exercises: Seated   Row  Both;15 reps    Theraband Level (Shoulder Row)  Level 3 (Green)    External Rotation  Strengthening;Both;10 reps;Theraband    Theraband Level (Shoulder External Rotation)  Level 3 (Green)    Flexion  Strengthening;Left;10 reps    Flexion Weight (lbs)  2    to ~80 deg   Abduction  Strengthening;Left;10 reps    ABduction Weight (lbs)  1   to 80 deg     Balance Exercises - 03/22/19 1157      Balance Exercises: Standing   Standing Eyes Closed  Foam/compliant surface;10 secs;3 reps   CGA due to sway   Tandem Stance  Eyes open;Intermittent upper extremity support;2 reps;15 secs    SLS  Eyes open;Solid surface;Upper extremity support 1;2 reps;10 secs    Wall Bumps  Hip;10 reps    Stepping Strategy  Lateral;5 reps;Foam/compliant surface    Rockerboard  Anterior/posterior;10 seconds, 2 reps, on upside down 1/2 foam roller with intermittent UE support and CGA for safety    Gait with Head Turns  Forward;1 rep    Tandem Gait  Forward;Intermittent upper extremity support;1 rep     Retro Gait  2 reps   cues to take bigger step with LLE   Sidestepping  4 reps   with increased step height, intermittent UE to steady   Turning  Right;Left;3 reps ~6 sec each direction         PT Short Term Goals - 03/18/19 1323      PT SHORT TERM GOAL #1   Title  Independent with Initial HEP    Status  Achieved      PT SHORT TERM GOAL #2   Title  decreased pain in left shoulder with  ADLS by 00%    Time  4    Period  Weeks    Status  Achieved        PT Long Term Goals - 03/22/19 1253      PT LONG TERM GOAL #1   Title  Independent with HEP for strengthening and flexibility to increase function.    Time  8    Period  Weeks    Status  On-going      PT LONG TERM GOAL #2   Title  Improved left shoulder ROM to [facility] to perform normal ADLS with 4/59 pain or less.    Baseline  WFL with pain of 4/10 with ABD    Time  8    Period  Weeks    Status  Partially Met      PT LONG TERM GOAL #3   Title  Improved left shoulder strength to 5/5 to normalize ADLS    Status  Partially Met      PT LONG TERM GOAL #4   Title  Patient able to safely amb 500 feet with least restrictive AD and without verbal cueing for correct form.    Time  8    Period  Weeks    Status  On-going      PT LONG TERM GOAL #5   Title  FOTO limitations <= 34%    Status  On-going      PT LONG TERM GOAL #6   Title  Deceased N/T in left side by 50% or more.    Period  Weeks    Status  On-going      PT LONG TERM GOAL #7   Title  Improved score on BERG to 52/56 to decrease fall risk.    Status  On-going            Plan - 03/22/19 1247    Clinical Impression Statement  Pt demonstrated improved Lt shoulder strength. Balance gradually improving; able to stand with eyes closed on compliant surface with less sway.  continued functional weakness in Lt quad/hip; requires occasional cues to flex at hip to clear foot with stepping strategy.  Progressing well towards goals; anticipate d/c in next few visits.     Comorbidities  TIA December 2020, CAD, DM, HTN, arthritis, bypass surgery 2013, bil knee arthroscopies    PT Frequency  2x / week    PT Duration  8 weeks    PT Treatment/Interventions  ADLs/Self Care Home Management;Electrical Stimulation;Iontophoresis 24m/ml Dexamethasone;Moist Heat;Therapeutic exercise;Balance training;Neuromuscular re-education;Patient/family education;Manual techniques;Dry needling;Passive range of motion;Joint Manipulations;Cryotherapy;Ultrasound;Gait training    PT Next Visit Plan  balance exercises including stepping strategy, retro gait.    PT Home Exercise Plan  [id number]   Consulted and Agree with Plan of Care  Patient       Patient will benefit from skilled therapeutic intervention in order to improve the following deficits and impairments:  Abnormal gait, Decreased balance, Pain, Postural dysfunction, Impaired flexibility, Decreased strength, Decreased activity tolerance, Decreased range of motion, Impaired UE functional use, Impaired sensation  Visit Diagnosis: Unsteadiness on feet  Other abnormalities of gait and mobility  Stiffness of left shoulder, not elsewhere classified     Problem List Patient Active Problem List   Diagnosis Date Noted  . Left sided numbness 02/06/2019  . Acute [place]ar [place] ([place] 02/06/2019  . Obesity, Class II, BMI 35-39.9 02/06/2019  . Dyslipidemia   . Ischemic cardiomyopathy 10/12/2017  . Mild pulmonary hypertension ([place] 10/12/2017  . S/P  CABG (coronary artery bypass graft) 10/01/2017  . Status post coronary artery stent placement   . Left ventricular systolic dysfunction 92/33/0076  . Diverticulitis of sigmoid colon 07/24/2017  . Chronic systolic CHF (congestive heart failure) ([place]) 07/24/2017  . CKD (chronic kidney disease), stage III 07/24/2017  . Pleural thickening 07/24/2017  . Aortic atherosclerosis ([place]) 07/07/2017  . Abnormal nuclear cardiac imaging test 11/14/2013  . Abnormal EKG 03/09/2013  . Dilated  aortic root ([place])   . Edema 02/09/2013  . Hypertension 10/24/2011  . Coronary artery disease 10/24/2011  . Hyperlipidemia 10/24/2011  . DM (diabetes mellitus), type 2, uncontrolled with complications ([place]) 22/63/3354  . GERD (gastroesophageal reflux disease) 10/24/2011   Kerin Perna, PTA 03/22/19 1:02 PM  [place], [place], [address] Phone: [phone]   Fax:  [phone]  Name: James [name] MRN: [id number] Date of Birth: Oct 19, 1945

## 2019-03-28 MED FILL — TOUJEO MAX SOLOSTAR 300 UNI: 300 | 38 days supply | Qty: 3 | Fill #1

## 2019-03-28 MED FILL — NOVOLOG FLEXPEN SYRINGE: 100 | 30 days supply | Qty: 15 | Fill #0

## 2019-03-29 ENCOUNTER — Other Ambulatory Visit: Payer: Self-pay

## 2019-03-29 ENCOUNTER — Ambulatory Visit: Payer: Medicare HMO | Admitting: Physical Therapy

## 2019-03-29 DIAGNOSIS — R2689 Other abnormalities of gait and mobility: Secondary | ICD-10-CM | POA: Diagnosis not present

## 2019-03-29 DIAGNOSIS — R2681 Unsteadiness on feet: Secondary | ICD-10-CM | POA: Diagnosis not present

## 2019-03-29 NOTE — Therapy (Signed)
[place], [place], [address] Phone: [phone]   Fax:  [phone]  Physical Therapy Treatment  Patient Details  Name: James [name] MRN: [id number] Date of Birth: 14-Sep-1945 Referring Provider (PT): Vernell Leep, MD   Encounter Date: 03/29/2019  PT End of Session - 03/29/19 1104    Visit Number  9    Number of Visits  12    Date for PT Re-Evaluation  04/05/19    Authorization Type  [employer] MCR    PT Start Time  1105    PT Stop Time  1144    PT Time Calculation (min)  39 min    Activity Tolerance  Patient tolerated treatment well    Behavior During Therapy  [facility] for tasks assessed/performed       Past Medical History:  Diagnosis Date  . Abnormal EKG 03/09/2013  . Abnormal nuclear cardiac imaging test 11/14/2013  . Acute lacunar infarction ([place]) 02/06/2019  . Aortic atherosclerosis ([place]) 07/07/2017  . Arthritis    "all over" (09/30/2017)  . Ascending aortic aneurysm ([place])    cMRI (2/15): Normal LV EF 54%, Mild BAE, Trileaflet Aortic Valve, Upper limits of normal ascending aorta 3.8 cm, Normal aortic arch 2.3 cm with bovine origin of left carotid  . Atrial flutter (HCC)    Post-op with no reoccurence  . Chronic diastolic heart failure ([place])    Echo (1/15): Left ventricle: The cavity size was mildly dilated. Mild LVH. EF 55% to 60%. Wall motion was normal; Gr 1 diastolic dysfunction Left atrium: The atrium was mildly dilated. Right atrium: The atrium was mildly to moderately dilated. Aortic arch measures 4.8 cm (moderately dilated); suggest CTA or MRA to better assess.  . Chronic systolic CHF (congestive heart failure) ([place]) 07/24/2017  . CKD (chronic kidney disease), stage III 07/24/2017  . Coronary artery disease 10/2011   a. s/p cabg;  b. Myoview (9/15):  ant ischemia, poss TID, EF 43%, high risk >> LHC (9/15): Dist LM 50, pLAD 90 then 100, pCFX 50, mCFX 75, oRCA 75, dRCA 95, L-LAD patent, S-Dx  100, S-OM1 patent, S-PDA patent, EF 55% >> native Dx small caliber and not well suited for PCI; native RCA amenable to PCI and would restore flow to PLA branches but would jeopardize SVG-RCA - Med Rx  . Deviated nasal septum   . Dilated aortic root ([place])   . Diverticulitis of sigmoid colon 07/24/2017  . DM (diabetes mellitus), type 2, uncontrolled with complications ([place])    onset 2013  . Dyslipidemia   . Edema 02/09/2013  . GERD (gastroesophageal reflux disease)   . Hx of Doppler ultrasound    Carotid US (9/13): No ICA stenosis  . Hypertension   . Pleural thickening 07/24/2017    Past Surgical History:  Procedure Laterality Date  . CORONARY ARTERY BYPASS GRAFT  10/27/2011   Procedure: CORONARY ARTERY BYPASS GRAFTING (CABG);  Surgeon: Ivin Poot, MD;  Location: [place];  Service: Open Heart Surgery;  Laterality: N/A;  Coronary Artery Bypass Grafting times four using left internal mammary artery and right greater saphenous vein endoscopically harvested  . CORONARY STENT INTERVENTION N/A 09/30/2017   Procedure: CORONARY STENT INTERVENTION;  Surgeon: Jettie Booze, MD;  Location: [place] CV LAB;  Service: Cardiovascular;  Laterality: N/A;  . KNEE ARTHROSCOPY Left 2003  . KNEE ARTHROSCOPY Right 2006  . LEFT HEART CATHETERIZATION WITH CORONARY ANGIOGRAM N/A 10/23/2011   Procedure: LEFT HEART CATHETERIZATION WITH CORONARY  ANGIOGRAM;  Surgeon: Sueanne Margarita, MD;  Location: [facility] CATH LAB;  Service: Cardiovascular;  Laterality: N/A;  . LEFT HEART CATHETERIZATION WITH CORONARY ANGIOGRAM N/A 11/16/2013   Procedure: LEFT HEART CATHETERIZATION WITH CORONARY ANGIOGRAM;  Surgeon: Blane Ohara, MD;  Location: [facility] CATH LAB;  Service: Cardiovascular;  Laterality: N/A;  . RIGHT/LEFT HEART CATH AND CORONARY/GRAFT ANGIOGRAPHY N/A 09/30/2017   Procedure: RIGHT/LEFT HEART CATH AND CORONARY/GRAFT ANGIOGRAPHY;  Surgeon: Jettie Booze, MD;  Location: [place] [facility] LAB;  Service: Cardiovascular;   Laterality: N/A;    There were no vitals filed for this visit.                    [place] Adult PT Treatment/Exercise - 03/29/19 0001      Ambulation/Gait   Ambulation/Gait  Yes    Ambulation/Gait Assistance  6: Modified independent (Device/Increase time)    Ambulation Distance (Feet)  320 Feet    Assistive device  None    Gait Pattern  Decreased step length - right;Decreased step length - left;Decreased stride length;Right flexed knee in stance;Left flexed knee in stance;Wide base of support;Poor foot clearance - left;Poor foot clearance - right;Decreased dorsiflexion - right;Decreased dorsiflexion - left    Ambulation Surface  Level    Stairs  Yes    Stairs Assistance  7: Independent    Stair Management Technique  One rail Left;Alternating pattern    Number of Stairs  13    Height of Stairs  6    Gait Comments  poor eccentric quad control left       Knee/Hip Exercises: Standing   Step Down  Left;2 sets;5 reps;Hand Hold: 2;Step Height: 4"    Step Down Limitations  heel taps fwd and lateral; fwd too difficult and had some pain with lateral          Balance Exercises - 03/29/19 1304      Balance Exercises: Standing   Standing Eyes Opened  Narrow base of support (BOS);Foam/compliant surface;5 reps   head flex/ext; and simulated washing hair   Standing Eyes Closed  Foam/compliant surface;10 secs;3 reps   CGA due to sway   Tandem Stance  Eyes open;Intermittent upper extremity support;15 secs;4 reps    SLS with Vectors  Solid surface;Intermittent upper extremity assist;5 reps   tapping opp foot fwd and behind   Stepping Strategy  Lateral;5 reps;Foam/compliant surface    Tandem Gait  Forward;4 reps    Retro Gait  4 reps   cues to take bigger step with LLE   Sidestepping  4 reps   cues to take wider step with left   Turning  Right;Left   2 reps each         PT Short Term Goals - 03/18/19 1323      PT SHORT TERM GOAL #1   Title  Independent with Initial  HEP    Status  Achieved      PT SHORT TERM GOAL #2   Title  decreased pain in left shoulder with ADLS by 50%    Time  4    Period  Weeks    Status  Achieved        PT Long Term Goals - 03/22/19 1253      PT LONG TERM GOAL #1   Title  Independent with HEP for strengthening and flexibility to increase function.    Time  8    Period  Weeks    Status  On-going  PT LONG TERM GOAL #2   Title  Improved left shoulder ROM to [facility] to perform normal ADLS with 4/88 pain or less.    Baseline  WFL with pain of 4/10 with ABD    Time  8    Period  Weeks    Status  Partially Met      PT LONG TERM GOAL #3   Title  Improved left shoulder strength to 5/5 to normalize ADLS    Status  Partially Met      PT LONG TERM GOAL #4   Title  Patient able to safely amb 500 feet with least restrictive AD and without verbal cueing for correct form.    Time  8    Period  Weeks    Status  On-going      PT LONG TERM GOAL #5   Title  FOTO limitations <= 34%    Status  On-going      PT LONG TERM GOAL #6   Title  Deceased N/T in left side by 50% or more.    Period  Weeks    Status  On-going      PT LONG TERM GOAL #7   Title  Improved score on BERG to 52/56 to decrease fall risk.    Status  On-going            Plan - 03/29/19 1251    Clinical Impression Statement  Patient continuing to progress with balance activities. He demonstrates left quad weakness with descending stairs and for safety PT advised step to gait pattern at home. He did well with ascending stairs with one UE support. He safely ambulated 320 feet without AD. He still demonstrates decreased heel strike bil but had no LOB. Continued cues required to increase step length with retro gait and left side stepping. SLS was modified to SLS with foot taps front and back.    Comorbidities  TIA December 2020, CAD, DM, HTN, arthritis, bypass surgery 2013, bil knee arthroscopies    PT Frequency  2x / week    PT Duration  8 weeks    PT  Treatment/Interventions  ADLs/Self Care Home Management;Electrical Stimulation;Iontophoresis '4mg'$ /ml Dexamethasone;Moist Heat;Therapeutic exercise;Balance training;Neuromuscular re-education;Patient/family education;Manual techniques;Dry needling;Passive range of motion;Joint Manipulations;Cryotherapy;Ultrasound;Gait training    PT Next Visit Plan  10th visit progress note; FOTO; balance exercises including stepping strategy, retro gait.    PT Home Exercise Plan  [id number]    Consulted and Agree with Plan of Care  Patient       Patient will benefit from skilled therapeutic intervention in order to improve the following deficits and impairments:  Abnormal gait, Decreased balance, Pain, Postural dysfunction, Impaired flexibility, Decreased strength, Decreased activity tolerance, Decreased range of motion, Impaired UE functional use, Impaired sensation  Visit Diagnosis: Unsteadiness on feet  Other abnormalities of gait and mobility     Problem List Patient Active Problem List   Diagnosis Date Noted  . Left sided numbness 02/06/2019  . Acute lacunar infarction ([place]) 02/06/2019  . Obesity, Class II, BMI 35-39.9 02/06/2019  . Dyslipidemia   . Ischemic cardiomyopathy 10/12/2017  . Mild pulmonary hypertension ([place]) 10/12/2017  . S/P CABG (coronary artery bypass graft) 10/01/2017  . Status post coronary artery stent placement   . Left ventricular systolic dysfunction 38/88/2800  . Diverticulitis of sigmoid colon 07/24/2017  . Chronic systolic CHF (congestive heart failure) ([place]) 07/24/2017  . CKD (chronic kidney disease), stage III 07/24/2017  . Pleural thickening 07/24/2017  . Aortic atherosclerosis ([place])  07/07/2017  . Abnormal nuclear cardiac imaging test 11/14/2013  . Abnormal EKG 03/09/2013  . Dilated aortic root ([place])   . Edema 02/09/2013  . Hypertension 10/24/2011  . Coronary artery disease 10/24/2011  . Hyperlipidemia 10/24/2011  . DM (diabetes mellitus), type 2, uncontrolled  with complications ([place]) 22/29/7989  . GERD (gastroesophageal reflux disease) 10/24/2011    Madelyn Flavors PT 03/29/2019, 1:15 PM  [facility] [place], [place], [address] Phone: [phone]   Fax:  [phone]  Name: James [name] MRN: [id number] Date of Birth: May 10, 1945

## 2019-03-31 ENCOUNTER — Encounter: Payer: Self-pay | Admitting: Physical Therapy

## 2019-03-31 ENCOUNTER — Other Ambulatory Visit: Payer: Self-pay

## 2019-03-31 ENCOUNTER — Ambulatory Visit: Payer: Medicare HMO | Admitting: Physical Therapy

## 2019-03-31 DIAGNOSIS — R2681 Unsteadiness on feet: Secondary | ICD-10-CM | POA: Diagnosis not present

## 2019-03-31 DIAGNOSIS — M25612 Stiffness of left shoulder, not elsewhere classified: Secondary | ICD-10-CM

## 2019-03-31 DIAGNOSIS — M25512 Pain in left shoulder: Secondary | ICD-10-CM

## 2019-03-31 DIAGNOSIS — R2689 Other abnormalities of gait and mobility: Secondary | ICD-10-CM

## 2019-03-31 NOTE — Therapy (Signed)
[place], [place], [address] Phone: [phone]   Fax:  [phone]  Physical Therapy Treatment and Discharge Summary  Patient Details  Name: James [name] MRN: [id number] Date of Birth: [birth year] Referring Provider (PT): Vernell Leep, MD   Encounter Date: 03/31/2019   Physical Therapy Progress Note  Dates of Reporting Period: 02/22/19 to 03/31/19   PT End of Session - 03/31/19 1122    Visit Number  10    Number of Visits  12    Date for PT Re-Evaluation  04/05/19    Authorization Type  [employer] MCR    PT Start Time  1105    PT Stop Time  1150    PT Time Calculation (min)  45 min    Activity Tolerance  Patient tolerated treatment well    Behavior During Therapy  James [name] for tasks assessed/performed       Past Medical History:  Diagnosis Date  . Abnormal EKG 03/09/2013  . Abnormal nuclear cardiac imaging test 11/14/2013  . Acute lacunar infarction ([place]) 02/06/2019  . Aortic atherosclerosis ([place]) 07/07/2017  . Arthritis    "all over" (09/30/2017)  . Ascending aortic aneurysm ([place])    cMRI (2/15): Normal LV EF 54%, Mild BAE, Trileaflet Aortic Valve, Upper limits of normal ascending aorta 3.8 cm, Normal aortic arch 2.3 cm with bovine origin of left carotid  . Atrial flutter (HCC)    Post-op with no reoccurence  . Chronic diastolic heart failure ([place])    Echo (1/15): Left ventricle: The cavity size was mildly dilated. Mild LVH. EF 55% to 60%. Wall motion was normal; Gr 1 diastolic dysfunction Left atrium: The atrium was mildly dilated. Right atrium: The atrium was mildly to moderately dilated. Aortic arch measures 4.8 cm (moderately dilated); suggest CTA or MRA to better assess.  . Chronic systolic CHF (congestive heart failure) ([place]) 07/24/2017  . CKD (chronic kidney disease), stage III 07/24/2017  . Coronary artery disease 10/2011   a. s/p cabg;  b. Myoview (9/15):  ant ischemia, poss TID, EF 43%,  high risk >> LHC (9/15): Dist LM 50, pLAD 90 then 100, pCFX 50, mCFX 75, oRCA 75, dRCA 95, L-LAD patent, S-Dx 100, S-OM1 patent, S-PDA patent, EF 55% >> native Dx small caliber and not well suited for PCI; native RCA amenable to PCI and would restore flow to PLA branches but would jeopardize SVG-RCA - Med Rx  . Deviated nasal septum   . Dilated aortic root ([place])   . Diverticulitis of sigmoid colon 07/24/2017  . DM (diabetes mellitus), type 2, uncontrolled with complications ([place])    onset 2013  . Dyslipidemia   . Edema 02/09/2013  . GERD (gastroesophageal reflux disease)   . Hx of Doppler ultrasound    Carotid US (9/13): No ICA stenosis  . Hypertension   . Pleural thickening 07/24/2017    Past Surgical History:  Procedure Laterality Date  . CORONARY ARTERY BYPASS GRAFT  10/27/2011   Procedure: CORONARY ARTERY BYPASS GRAFTING (CABG);  Surgeon: Ivin Poot, MD;  Location: [facility];  Service: Open Heart Surgery;  Laterality: N/A;  Coronary Artery Bypass Grafting times four using left internal mammary artery and right greater saphenous vein endoscopically harvested  . CORONARY STENT INTERVENTION N/A 09/30/2017   Procedure: CORONARY STENT INTERVENTION;  Surgeon: Jettie Booze, MD;  Location: [facility];  Service: Cardiovascular;  Laterality: N/A;  . KNEE ARTHROSCOPY Left 2003  . KNEE ARTHROSCOPY Right 2006  .  LEFT HEART CATHETERIZATION WITH CORONARY ANGIOGRAM N/A 10/23/2011   Procedure: LEFT HEART CATHETERIZATION WITH CORONARY ANGIOGRAM;  Surgeon: Sueanne Margarita, MD;  Location: [facility] CATH LAB;  Service: Cardiovascular;  Laterality: N/A;  . LEFT HEART CATHETERIZATION WITH CORONARY ANGIOGRAM N/A 11/16/2013   Procedure: LEFT HEART CATHETERIZATION WITH CORONARY ANGIOGRAM;  Surgeon: Blane Ohara, MD;  Location: [facility] CATH LAB;  Service: Cardiovascular;  Laterality: N/A;  . RIGHT/LEFT HEART CATH AND CORONARY/GRAFT ANGIOGRAPHY N/A 09/30/2017   Procedure: RIGHT/LEFT HEART CATH AND  CORONARY/GRAFT ANGIOGRAPHY;  Surgeon: Jettie Booze, MD;  Location: [facility] CV LAB;  Service: Cardiovascular;  Laterality: N/A;    There were no vitals filed for this visit.  Subjective Assessment - 03/31/19 1107    Subjective  The shoulder is much better. It is still limited with reaching out but is happy with level of function. Patent states his balance has improved a lot.  This morning he tripped over his compressor hose, but caught himself. "I didn't even come close to falling".    Pertinent History  TIA December 2020, CAD, DM, HTN, arthritis, bypass surgery 2013, bil knee arthroscopies    Currently in Pain?  No/denies         [facility] PT Assessment - 03/31/19 0001      Strength   Strength Assessment Site  Shoulder    Right/Left Shoulder  Left    Left Shoulder Flexion  5/5    Left Shoulder Extension  5/5    Left Shoulder ABduction  5/5    Left Shoulder Internal Rotation  5/5    Left Shoulder External Rotation  5/5      Berg Balance Test   Sit to Stand  Able to stand without using hands and stabilize independently    Standing Unsupported  Able to stand safely 2 minutes    Sitting with Back Unsupported but Feet Supported on Floor or Stool  Able to sit safely and securely 2 minutes    Stand to Sit  Sits safely with minimal use of hands    Transfers  Able to transfer safely, minor use of hands    Standing Unsupported with Eyes Closed  Able to stand 10 seconds safely    Standing Unsupported with Feet Together  Able to place feet together independently and stand 1 minute safely    From Standing, Reach Forward with Outstretched Arm  Can reach confidently >25 cm (10")    From Standing Position, Pick up Object from Floor  Able to pick up shoe safely and easily    From Standing Position, Turn to Look Behind Over each Shoulder  Looks behind from both sides and weight shifts well    Turn 360 Degrees  Able to turn 360 degrees safely in 4 seconds or less    Standing Unsupported,  Alternately Place Feet on Step/Stool  Able to stand independently and safely and complete 8 steps in 20 seconds    Standing Unsupported, One Foot in Front  Able to plae foot ahead of the other independently and hold 30 seconds    Standing on One Leg  Tries to lift leg/unable to hold 3 seconds but remains standing independently   able to stand 3 sec on right after using finger to get balan   Total Score  52                   [facility] Adult PT Treatment/Exercise - 03/31/19 0001      Shoulder Exercises: Standing  Flexion  Left    Shoulder Flexion Weight (lbs)  8    Flexion Limitations  able to lift 8 pounds above 90 degrees x 2    tested for FOTO         Balance Exercises - 03/31/19 1208      Balance Exercises: Standing   Standing Eyes Closed  Narrow base of support (BOS);Foam/compliant surface;2 reps;30 secs    Tandem Stance  Eyes open;1 rep   30 sec   SLS with Vectors  Solid surface;Intermittent upper extremity assist;5 reps   tapping opp foot fwd and behind   Step Ups  Forward;4 inch   alt steps x 24   Retro Gait  4 reps;Theraband   cues to take bigger step with LLE   Theraband Level (Retro Gait)  Level 3 (Green)    Sidestepping  4 reps;Theraband   cues to take wider step with left   Theraband Level (Sidestepping)  Level 2 (Red)    Other Standing Exercises  resisted forward walking 1 lap of gym        PT Education - 03/31/19 1200    Education Details  Reviewed HEP, discussed minimizing fall risk by being slow and deliberate, using night lights (which he does) and ensuring someone is with him when doing more risky ADLS.    Person(s) Educated  Patient    Methods  Explanation    Comprehension  Verbalized understanding       PT Short Term Goals - 03/18/19 1323      PT SHORT TERM GOAL #1   Title  Independent with Initial HEP    Status  Achieved      PT SHORT TERM GOAL #2   Title  decreased pain in left shoulder with ADLS by 02%    Time  4    Period   Weeks    Status  Achieved        PT Long Term Goals - 03/31/19 1108      PT LONG TERM GOAL #1   Title  Independent with HEP for strengthening and flexibility to increase function.    Status  Achieved      PT LONG TERM GOAL #2   Title  Improved left shoulder ROM to [place] [name] to perform normal ADLS with 4/09 pain or less.    Status  Achieved      PT LONG TERM GOAL #3   Title  Improved left shoulder strength to 5/5 to normalize ADLS    Status  Achieved      PT LONG TERM GOAL #4   Title  Patient able to safely amb 500 feet with least restrictive AD and without verbal cueing for correct form.    Baseline  He walked 320 feet safely in the clinic without AD.    Status  Partially Met      PT LONG TERM GOAL #5   Title  FOTO limitations <= 34%    Baseline  33% limited with shoulder 03/30/18    Status  Achieved      PT LONG TERM GOAL #6   Title  Deceased N/T in left side by 50% or more.    Baseline  pt reports 70% improvement    Status  Achieved      PT LONG TERM GOAL #7   Title  Improved score on BERG to 52/56 to decrease fall risk.    Status  Achieved  Plan - 03/31/19 1211    Clinical Impression Statement  Patient has met all of his LTGs for shoulder and balance except his goal for distance of 500 ft. He safely walked 320 feet without AD at his last visit and overall is pleased with his current functional level. He has had no falls since eval and his BERG has improved 5 points. HEP was reviewed. Patient is compliant with HEP.    Comorbidities  TIA December 2020, CAD, DM, HTN, arthritis, bypass surgery 2013, bil knee arthroscopies    PT Frequency  2x / week    PT Duration  8 weeks    PT Treatment/Interventions  ADLs/Self Care Home Management;Electrical Stimulation;Iontophoresis 70m/ml Dexamethasone;Moist Heat;Therapeutic exercise;Balance training;Neuromuscular re-education;Patient/family education;Manual techniques;Dry needling;Passive range of motion;Joint  Manipulations;Cryotherapy;Ultrasound;Gait training    PT Next Visit Plan  see D/C summary    PT Home Exercise Plan  [id number]   Consulted and Agree with Plan of Care  Patient       Patient will benefit from skilled therapeutic intervention in order to improve the following deficits and impairments:  Abnormal gait, Decreased balance, Pain, Postural dysfunction, Impaired flexibility, Decreased strength, Decreased activity tolerance, Decreased range of motion, Impaired UE functional use, Impaired sensation  Visit Diagnosis: Unsteadiness on feet  Other abnormalities of gait and mobility  Stiffness of left shoulder, not elsewhere classified  Acute pain of left shoulder     Problem List Patient Active Problem List   Diagnosis Date Noted  . Left sided numbness 02/06/2019  . Acute lacunar infarction ([place] 02/06/2019  . Obesity, Class II, BMI 35-39.9 02/06/2019  . Dyslipidemia   . Ischemic cardiomyopathy 10/12/2017  . Mild pulmonary hypertension ([place] 10/12/2017  . S/P CABG (coronary artery bypass graft) 10/01/2017  . Status post coronary artery stent placement   . Left ventricular systolic dysfunction 023/76/2831 . Diverticulitis of sigmoid colon 07/24/2017  . Chronic systolic CHF (congestive heart failure) ([place] 07/24/2017  . CKD (chronic kidney disease), stage III 07/24/2017  . Pleural thickening 07/24/2017  . Aortic atherosclerosis ([place] 07/07/2017  . Abnormal nuclear cardiac imaging test 11/14/2013  . Abnormal EKG 03/09/2013  . Dilated aortic root ([place]   . Edema 02/09/2013  . Hypertension 10/24/2011  . Coronary artery disease 10/24/2011  . Hyperlipidemia 10/24/2011  . DM (diabetes mellitus), type 2, uncontrolled with complications ([place] 051/76/1607 . GERD (gastroesophageal reflux disease) 10/24/2011    JMadelyn FlavorsPT 03/31/2019, 12:17 PM  [facility] [place] [address]Phone:  [phone]  Fax:  [phone] Name: TMACKINLEY KIEHNMRN: [id number] of Birth: 406-18-1947 PHYSICAL THERAPY DISCHARGE SUMMARY  Visits from Start of Care: 10  Current functional level related to goals / functional outcomes: See above   Remaining deficits: See above   Education / Equipment: HEP  Plan: Patient agrees to discharge.  Patient goals were partially met. Patient is being discharged due to being pleased with the current functional level.  ?????     JMadelyn Flavors PT 03/31/19 12:21 PM  [facility] [address] [place] [address] [phone](office) [phone](fax)

## 2019-04-06 MED FILL — metFORMIN HCL ER 500 MG TB2: 500 | 30 days supply | Qty: 60 | Fill #0

## 2019-04-06 MED FILL — TOUJEO MAX SOLOSTAR 300 UNI: 300 | 38 days supply | Qty: 3 | Fill #1

## 2019-04-06 MED FILL — NOVOLOG FLEXPEN SYRINGE: 100 | 30 days supply | Qty: 15 | Fill #0

## 2019-04-08 ENCOUNTER — Other Ambulatory Visit: Payer: Self-pay | Admitting: Medical

## 2019-04-08 DIAGNOSIS — I480 Paroxysmal atrial fibrillation: Secondary | ICD-10-CM

## 2019-04-08 DIAGNOSIS — G459 Transient cerebral ischemic attack, unspecified: Secondary | ICD-10-CM

## 2019-04-12 ENCOUNTER — Telehealth: Payer: Self-pay

## 2019-04-12 DIAGNOSIS — I493 Ventricular premature depolarization: Secondary | ICD-10-CM

## 2019-04-12 NOTE — Telephone Encounter (Signed)
-----   Message from Quintella Reichert, MD sent at 04/11/2019  1:34 PM EST ----- Event monitor showed frequent PVCs with PVC load 9%.  Please refer to EP for ILR as no PAF on event monitor ordered for CVA.  They can also comment on increased PVCs.

## 2019-04-18 ENCOUNTER — Other Ambulatory Visit: Payer: Self-pay | Admitting: Physician Assistant

## 2019-04-18 ENCOUNTER — Other Ambulatory Visit: Payer: Self-pay | Admitting: Medical

## 2019-04-18 MED FILL — PRAMIPEXOLE 0.75 MG TABLET: 0.75 | 90 days supply | Qty: 180 | Fill #0

## 2019-04-18 MED FILL — METOPROLOL SUCCINATE ER 100: 100 | 90 days supply | Qty: 90 | Fill #3

## 2019-04-18 MED FILL — FREESTYLE LIBRE 14 DAY SENS: 28 days supply | Qty: 2 | Fill #0

## 2019-04-19 MED FILL — ATORVASTATIN 80 MG TABLET: 80 | 30 days supply | Qty: 30 | Fill #0

## 2019-04-19 MED FILL — POTASSIUM CL ER 10 MEQ TAB: 10 | 30 days supply | Qty: 30 | Fill #0

## 2019-04-22 ENCOUNTER — Ambulatory Visit: Payer: Medicare HMO | Admitting: Internal Medicine

## 2019-04-22 ENCOUNTER — Encounter: Payer: Self-pay | Admitting: Internal Medicine

## 2019-04-22 ENCOUNTER — Other Ambulatory Visit: Payer: Self-pay

## 2019-04-22 VITALS — BP 152/84 | HR 80 | Ht 69.0 in | Wt 262.4 lb

## 2019-04-22 DIAGNOSIS — I639 Cerebral infarction, unspecified: Secondary | ICD-10-CM | POA: Diagnosis not present

## 2019-04-22 NOTE — Progress Notes (Signed)
HPI James [name] is referred today by Dr. Mayford Knife for evaluation of a cryptogenic stroke. He was hospitalized several weeks ago and has worn a 30 day monitor and only had occaisional PVC's and no evidence of atrial fib. He has had a full neuro recovery. The etiology of his stroke is unclear. The patient denies chest pain or sob. Some leg swelling.  Allergies  Allergen Reactions  . Lisinopril Cough     Current Outpatient Medications  Medication Sig Dispense Refill  . acetaminophen (TYLENOL) 500 MG tablet Take 1,000 mg by mouth every 6 (six) hours as needed for headache.    Marland Kitchen aspirin 81 MG tablet Take 1 tablet (81 mg total) by mouth at bedtime. Take for next 21 days and then stop.    Marland Kitchen atorvastatin (LIPITOR) 80 MG tablet TAKE 1 TABLET BY MOUTH ONCE DAILY *PLEASE MAKE APPOINTMENT FOR FUTURE REFILLS* 30 tablet 0  . cholecalciferol (VITAMIN D) 1000 units tablet Take 1,000 Units by mouth daily.    Marland Kitchen ezetimibe (ZETIA) 10 MG tablet Take 1 tablet (10 mg total) by mouth daily. Please make overdue appt with Dr. Mayford Knife before anymore refills. 1st attempt 30 tablet 0  . glimepiride (AMARYL) 4 MG tablet Take 4 mg by mouth daily with breakfast.    . insulin aspart (NOVOLOG) 100 UNIT/ML injection Inject 8 Units into the skin See admin instructions. Three times a day with meals and as needed using sliding scale.    . iron polysaccharides (NIFEREX) 150 MG capsule Take 1 capsule (150 mg total) by mouth daily. 30 capsule 0  . isosorbide mononitrate (IMDUR) 60 MG 24 hr tablet Take 60 mg by mouth daily.    . metFORMIN (GLUCOPHAGE-XR) 500 MG 24 hr tablet Take 1 tablet by mouth daily.    . metoprolol succinate (TOPROL-XL) 100 MG 24 hr tablet Take 125 mg by mouth daily.   10  . Multiple Vitamin (MULTIVITAMIN WITH MINERALS) TABS tablet Take 1 tablet by mouth daily.    . Omega 3 1200 MG CAPS Take 2,400 mg by mouth daily.    . polyethylene glycol (MIRALAX) packet Take 17 g by mouth daily. 14 each 0  . potassium  chloride (KLOR-CON) 10 MEQ tablet TAKE 1 TABLET (10 MEQ TOTAL) BY MOUTH SEE ADMIN INSTRUCTIONS. PLEASE MAKE APT FOR FUTURE REFILLS 1ST ATTEMPT [phone]. 30 tablet 0  . pramipexole (MIRAPEX) 0.75 MG tablet Take 1.5 mg by mouth every evening.     . Semaglutide,0.25 or 0.5MG /DOS, (OZEMPIC, 0.25 OR 0.5 MG/DOSE,) 2 MG/1.5ML SOPN Inject 0.5 mg into the skin once a week.    [name] SOLOSTAR 300 UNIT/ML SOPN Inject 24 Units into the skin at bedtime.   3  . nitroGLYCERIN (NITROSTAT) 0.4 MG SL tablet Place 1 tablet (0.4 mg total) under the tongue every 5 (five) minutes as needed for chest pain. 25 tablet 3   No current facility-administered medications for this visit.     Past Medical History:  Diagnosis Date  . Abnormal EKG 03/09/2013  . Abnormal nuclear cardiac imaging test 11/14/2013  . Acute lacunar infarction (HCC) 02/06/2019  . Aortic atherosclerosis (HCC) 07/07/2017  . Arthritis    "all over" (09/30/2017)  . Ascending aortic aneurysm (HCC)    cMRI (2/15): Normal LV EF 54%, Mild BAE, Trileaflet Aortic Valve, Upper limits of normal ascending aorta 3.8 cm, Normal aortic arch 2.3 cm with bovine origin of left carotid  . Atrial flutter (HCC)    Post-op with  no reoccurence  . Chronic diastolic heart failure ([place])    Echo (1/15): Left ventricle: The cavity size was mildly dilated. Mild LVH. EF 55% to 60%. Wall motion was normal; Gr 1 diastolic dysfunction Left atrium: The atrium was mildly dilated. Right atrium: The atrium was mildly to moderately dilated. Aortic arch measures 4.8 cm (moderately dilated); suggest CTA or MRA to better assess.  . Chronic systolic CHF (congestive heart failure) ([place]) 07/24/2017  . CKD (chronic kidney disease), stage III 07/24/2017  . Coronary artery disease 10/2011   a. s/p cabg;  b. Myoview (9/15):  ant ischemia, poss TID, EF 43%, high risk >> LHC (9/15): Dist LM 50, pLAD 90 then 100, pCFX 50, mCFX 75, oRCA 75, dRCA 95, L-LAD patent, S-Dx 100, S-OM1 patent, S-PDA  patent, EF 55% >> native Dx small caliber and not well suited for PCI; native RCA amenable to PCI and would restore flow to PLA branches but would jeopardize SVG-RCA - Med Rx  . Deviated nasal septum   . Dilated aortic root ([place])   . Diverticulitis of sigmoid colon 07/24/2017  . DM (diabetes mellitus), type 2, uncontrolled with complications ([place])    onset 2013  . Dyslipidemia   . Edema 02/09/2013  . GERD (gastroesophageal reflux disease)   . Hx of Doppler ultrasound    Carotid US (9/13): No ICA stenosis  . Hypertension   . Pleural thickening 07/24/2017    ROS:   All systems reviewed and negative except as noted in the HPI.   Past Surgical History:  Procedure Laterality Date  . CORONARY ARTERY BYPASS GRAFT  10/27/2011   Procedure: CORONARY ARTERY BYPASS GRAFTING (CABG);  Surgeon: Ivin Poot, MD;  Location: [facility];  Service: Open Heart Surgery;  Laterality: N/A;  Coronary Artery Bypass Grafting times four using left internal mammary artery and right greater saphenous vein endoscopically harvested  . CORONARY STENT INTERVENTION N/A 09/30/2017   Procedure: CORONARY STENT INTERVENTION;  Surgeon: Jettie Booze, MD;  Location: [facility] CV LAB;  Service: Cardiovascular;  Laterality: N/A;  . KNEE ARTHROSCOPY Left 2003  . KNEE ARTHROSCOPY Right 2006  . LEFT HEART CATHETERIZATION WITH CORONARY ANGIOGRAM N/A 10/23/2011   Procedure: LEFT HEART CATHETERIZATION WITH CORONARY ANGIOGRAM;  Surgeon: Sueanne Margarita, MD;  Location: [facility] CATH LAB;  Service: Cardiovascular;  Laterality: N/A;  . LEFT HEART CATHETERIZATION WITH CORONARY ANGIOGRAM N/A 11/16/2013   Procedure: LEFT HEART CATHETERIZATION WITH CORONARY ANGIOGRAM;  Surgeon: Blane Ohara, MD;  Location[facility] CATH LAB;  Service: Cardiovascular;  Laterality: N/A;  . RIGHT/LEFT HEART CATH AND CORONARY/GRAFT ANGIOGRAPHY N/A 09/30/2017   Procedure: RIGHT/LEFT HEART CATH AND CORONARY/GRAFT ANGIOGRAPHY;  Surgeon: Jettie Booze, MD;   Location: [facility] CV LAB;  Service: Cardiovascular;  Laterality: N/A;     Family History  Problem Relation Age of Onset  . Hypertension Mother   . Stroke Father 24  . Hypertension Father   . Hypertension Paternal Grandfather   . Heart attack Neg Hx      Social History   Socioeconomic History  . Marital status: Divorced    Spouse name: Not on file  . Number of children: Not on file  . Years of education: Not on file  . Highest education level: Not on file  Occupational History  . Occupation: retired  Tobacco Use  . Smoking status: Never Smoker  . Smokeless tobacco: Never Used  Substance and Sexual Activity  . Alcohol use: Yes    Comment: 09/30/2017 "1  beer a month or less"  . Drug use: Never  . Sexual activity: Not Currently  Other Topics Concern  . Not on file  Social History Narrative  . Not on file   Social Determinants of Health   Financial Resource Strain:   . Difficulty of Paying Living Expenses: Not on file  Food Insecurity:   . Worried About Programme researcher, broadcasting/film/video in the Last Year: Not on file  . Ran Out of Food in the Last Year: Not on file  Transportation Needs:   . Lack of Transportation (Medical): Not on file  . Lack of Transportation (Non-Medical): Not on file  Physical Activity:   . Days of Exercise per Week: Not on file  . Minutes of Exercise per Session: Not on file  Stress:   . Feeling of Stress : Not on file  Social Connections:   . Frequency of Communication with Friends and Family: Not on file  . Frequency of Social Gatherings with Friends and Family: Not on file  . Attends Religious Services: Not on file  . Active Member of Clubs or Organizations: Not on file  . Attends Banker Meetings: Not on file  . Marital Status: Not on file  Intimate Partner Violence:   . Fear of Current or Ex-Partner: Not on file  . Emotionally Abused: Not on file  . Physically Abused: Not on file  . Sexually Abused: Not on file     BP (!)  152/84   Pulse 80   Ht 5\' 9"  (1.753 m)   Wt 262 lb 6.4 oz (119 kg)   SpO2 96%   BMI 38.75 kg/m   Physical Exam:  Well appearing NAD HEENT: Unremarkable Neck:  No JVD, no thyromegally Lymphatics:  No adenopathy Back:  No CVA tenderness Lungs:  Clear with no wheezes HEART:  Regular rate rhythm, no murmurs, no rubs, no clicks Abd:  soft, positive bowel sounds, no organomegally, no rebound, no guarding Ext:  2 plus pulses, no edema, no cyanosis, no clubbing Skin:  No rashes no nodules Neuro:  CN II through XII intact, motor grossly intact  DEVICE  Normal device function.  See PaceArt for details.   Assess/Plan: 1. Cryptogenic stroke - the etiology of his stroke is unclear. He has worn a cardiac monitor and no clear cause of his symptoms. I have recommended insertion of an ILR. 2. HTN - his sbp is increased and he is encouraged to lose weight.   .D.

## 2019-04-22 NOTE — Patient Instructions (Addendum)
Medication Instructions:  Your physician recommends that you continue on your current medications as directed. Please refer to the Current Medication list given to you today.  Labwork: None ordered.  Testing/Procedures: None ordered.  Follow-Up:  April 27, 2019 at 9:15 am with Dr. Ladona Ridgel for loop implant   Any Other Special Instructions Will Be Listed Below (If Applicable).  If you need a refill on your cardiac medications before your next appointment, please call your pharmacy.    Implantable Loop Recorder Placement  An implantable loop recorder is a small electronic device that is placed under the skin of your chest. It is about the size of an AA ("double A") battery. The device records the electrical activity of your heart over a long period of time. Your health care provider can download these recordings to monitor your heart. You may need an implantable loop recorder if you have periods of abnormal heart activity (arrhythmias) or unexplained fainting (syncope). The recorder can be left in place for 1 year or longer. Tell a health care provider about:  Any allergies you have.  All medicines you are taking, including vitamins, herbs, eye drops, creams, and over-the-counter medicines.  Any problems you or family members have had with anesthetic medicines.  Any blood disorders you have.  Any surgeries you have had.  Any medical conditions you have.  Whether you are pregnant or may be pregnant. What are the risks? Generally, this is a safe procedure. However, problems may occur, including:  Infection.  Bleeding.  Allergic reactions to anesthetic medicines.  Damage to nerves or blood vessels.  Failure of the device to work. This could require another surgery to replace it. What happens before the procedure?   You may have a physical exam, blood tests, and imaging tests of your heart, such as a chest X-ray.  Follow instructions from your health care provider about  eating or drinking restrictions.  Ask your health care provider about: ? Changing or stopping your regular medicines. This is especially important if you are taking diabetes medicines or blood thinners. ? Taking medicines such as aspirin and ibuprofen. These medicines can thin your blood. Do not take these medicines unless your health care provider tells you to take them. ? Taking over-the-counter medicines, vitamins, herbs, and supplements.  Ask your health care provider how your surgical site will be marked or identified.  Ask your health care provider what steps will be taken to help prevent infection. These may include: ? Removing hair at the surgery site. ? Washing skin with a germ-killing soap.  Plan to have someone take you home from the hospital or clinic.  Plan to have a responsible adult care for you for at least 24 hours after you leave the hospital or clinic. This is important.  Do not use any products that contain nicotine or tobacco, such as cigarettes and e-cigarettes. If you need help quitting, ask your health care provider. What happens during the procedure?  An IV will be inserted into one of your veins.  You may be given one or more of the following: ? A medicine to help you relax (sedative). ? A medicine to numb the area (local anesthetic).  A small incision will be made on the left side of your upper chest.  A pocket will be created under your skin.  The device will be placed in the pocket.  The incision will be closed with stitches (sutures) or adhesive strips.  A bandage (dressing) will be placed over the  incision. The procedure may vary among health care providers and hospitals. What happens after the procedure?  Your blood pressure, heart rate, breathing rate, and blood oxygen level will be monitored until you leave the hospital or clinic.  You may be able to go home on the day of your surgery. Before you go home: ? Your health care provider will  program your recorder. ? You will learn how to trigger your device with a handheld activator. ? You will learn how to send recordings to your health care provider. ? You will get an ID card for your device, and you will be told when to use it.  Do not drive for 24 hours if you were given a sedative during your procedure. Summary  An implantable loop recorder is a small electronic device that is placed under the skin of your chest to monitor your heart over a long period of time.  The recorder can be left in place for 1 year or longer.  Plan to have someone take you home from the hospital or clinic. This information is not intended to replace advice given to you by your health care provider. Make sure you discuss any questions you have with your health care provider. Document Revised: 04/09/2017 Document Reviewed: 03/21/2017 Elsevier Patient Education  2020 [employer].

## 2019-04-27 ENCOUNTER — Other Ambulatory Visit: Payer: Self-pay

## 2019-04-27 ENCOUNTER — Ambulatory Visit (INDEPENDENT_AMBULATORY_CARE_PROVIDER_SITE_OTHER): Payer: Medicare HMO | Admitting: Internal Medicine

## 2019-04-27 ENCOUNTER — Encounter: Payer: Self-pay | Admitting: Internal Medicine

## 2019-04-27 DIAGNOSIS — I639 Cerebral infarction, unspecified: Secondary | ICD-10-CM

## 2019-04-27 NOTE — Patient Instructions (Addendum)
Medication Instructions:  Your physician recommends that you continue on your current medications as directed. Please refer to the Current Medication list given to you today.  Labwork: None ordered.  Testing/Procedures: None ordered.  Follow-Up:  You will are scheduled for a wound check with device clinic at the [facility] office on May 05, 2019 at 8:30 am.  Your physician wants you to follow-up in: one year with Dr. Ladona Ridgel.  You will receive a reminder letter in the mail two months in advance. If you don't receive a letter, please call our office to schedule the follow-up appointment.   Any Other Special Instructions Will Be Listed Below (If Applicable).  If you need a refill on your cardiac medications before your next appointment, please call your pharmacy.    Implantable Loop Recorder Placement, Care After This sheet gives you information about how to care for yourself after your procedure. Your health care provider may also give you more specific instructions. If you have problems or questions, contact your health care provider. What can I expect after the procedure? After the procedure, it is common to have:  Soreness or discomfort near the incision.  Some swelling or bruising near the incision. Follow these instructions at home: Incision care   Follow instructions from your health care provider about how to take care of your incision. Make sure you: ? Leave your outer dressing on for 96 hours.  After 96 hours you can remove that and shower. ? Leave adhesive strips in place. These skin closures may need to stay in place for 2 weeks or longer. If adhesive strip edges start to loosen and curl up, you may trim the loose edges. Do not remove adhesive strips completely unless your health care provider tells you to do that.  Check your incision area every day for signs of infection. Check for: ? Redness, swelling, or pain. ? Fluid or blood. ? Warmth. ? Pus or a bad  smell. Do not take baths, swim, or use a hot tub until your incision is completely healed.  Activity  Return to your normal activities.  General instructions  Follow instructions from your health care provider about how to manage your implantable loop recorder and transmit the information. Learn how to activate a recording if this is necessary for your type of device.  Do not go through a metal detection gate, and do not let someone hold a metal detector over your chest. Show your ID card.  Do not have an MRI unless you check with your health care provider first.  Take over-the-counter and prescription medicines only as told by your health care provider.  Keep all follow-up visits as told by your health care provider. This is important. Contact a health care provider if:  You have redness, swelling, or pain around your incision.  You have a fever.  You have pain that is not relieved by your pain medicine.  You have triggered your device because of fainting (syncope) or because of a heartbeat that feels like it is racing, slow, fluttering, or skipping (palpitations). Get help right away if you have:  Chest pain.  Difficulty breathing. Summary  After the procedure, it is common to have soreness or discomfort near the incision.  Change your dressing as told by your health care provider.  Follow instructions from your health care provider about how to manage your implantable loop recorder and transmit the information.  Keep all follow-up visits as told by your health care provider. This is  important. This information is not intended to replace advice given to you by your health care provider. Make sure you discuss any questions you have with your health care provider. Document Released: 01/15/2015 Document Revised: 03/21/2017 Document Reviewed: 03/21/2017 Elsevier Patient Education  2020 [employer].

## 2019-04-27 NOTE — Progress Notes (Signed)
HPI James [name] returns today for followup of his cryptogenic stroke. He has not had additional symptoms. He presents to undergo ILR insertion. Allergies  Allergen Reactions  . Lisinopril Cough     Current Outpatient Medications  Medication Sig Dispense Refill  . acetaminophen (TYLENOL) 500 MG tablet Take 1,000 mg by mouth every 6 (six) hours as needed for headache.    Marland Kitchen atorvastatin (LIPITOR) 80 MG tablet TAKE 1 TABLET BY MOUTH ONCE DAILY *PLEASE MAKE APPOINTMENT FOR FUTURE REFILLS* 30 tablet 0  . cholecalciferol (VITAMIN D) 1000 units tablet Take 1,000 Units by mouth daily.    Marland Kitchen ezetimibe (ZETIA) 10 MG tablet Take 1 tablet (10 mg total) by mouth daily. Please make overdue appt with Dr. Radford Pax before anymore refills. 1st attempt 30 tablet 0  . glimepiride (AMARYL) 4 MG tablet Take 4 mg by mouth daily with breakfast.    . insulin aspart (NOVOLOG) 100 UNIT/ML injection Inject 8 Units into the skin See admin instructions. Three times a day with meals and as needed using sliding scale.    . iron polysaccharides (NIFEREX) 150 MG capsule Take 1 capsule (150 mg total) by mouth daily. 30 capsule 0  . isosorbide mononitrate (IMDUR) 60 MG 24 hr tablet Take 60 mg by mouth daily.    . metFORMIN (GLUCOPHAGE-XR) 500 MG 24 hr tablet Take 1 tablet by mouth daily.    . metoprolol succinate (TOPROL-XL) 100 MG 24 hr tablet Take 125 mg by mouth daily.   10  . Multiple Vitamin (MULTIVITAMIN WITH MINERALS) TABS tablet Take 1 tablet by mouth daily.    . Omega 3 1200 MG CAPS Take 2,400 mg by mouth daily.    . polyethylene glycol (MIRALAX) packet Take 17 g by mouth daily. 14 each 0  . potassium chloride (KLOR-CON) 10 MEQ tablet TAKE 1 TABLET (10 MEQ TOTAL) BY MOUTH SEE ADMIN INSTRUCTIONS. PLEASE MAKE APT FOR FUTURE REFILLS 1ST ATTEMPT [phone]. 30 tablet 0  . pramipexole (MIRAPEX) 0.75 MG tablet Take 1.5 mg by mouth every evening.     . Semaglutide,0.25 or 0.5MG /DOS, (OZEMPIC, 0.25 OR 0.5 MG/DOSE,) 2  MG/1.5ML SOPN Inject 0.5 mg into the skin once a week.    Nelva Nay SOLOSTAR 300 UNIT/ML SOPN Inject 24 Units into the skin at bedtime.   3  . nitroGLYCERIN (NITROSTAT) 0.4 MG SL tablet Place 1 tablet (0.4 mg total) under the tongue every 5 (five) minutes as needed for chest pain. 25 tablet 3   No current facility-administered medications for this visit.     Past Medical History:  Diagnosis Date  . Abnormal EKG 03/09/2013  . Abnormal nuclear cardiac imaging test 11/14/2013  . Acute lacunar infarction ([place]) 02/06/2019  . Aortic atherosclerosis ([place]) 07/07/2017  . Arthritis    "all over" (09/30/2017)  . Ascending aortic aneurysm ([place])    cMRI (2/15): Normal LV EF 54%, Mild BAE, Trileaflet Aortic Valve, Upper limits of normal ascending aorta 3.8 cm, Normal aortic arch 2.3 cm with bovine origin of left carotid  . Atrial flutter (HCC)    Post-op with no reoccurence  . Chronic diastolic heart failure ([place])    Echo (1/15): Left ventricle: The cavity size was mildly dilated. Mild LVH. EF 55% to 60%. Wall motion was normal; Gr 1 diastolic dysfunction Left atrium: The atrium was mildly dilated. Right atrium: The atrium was mildly to moderately dilated. Aortic arch measures 4.8 cm (moderately dilated); suggest CTA or MRA to better assess.  Marland Kitchen  Chronic systolic CHF (congestive heart failure) (HCC) 07/24/2017  . CKD (chronic kidney disease), stage III 07/24/2017  . Coronary artery disease 10/2011   a. s/p cabg;  b. Myoview (9/15):  ant ischemia, poss TID, EF 43%, high risk >> LHC (9/15): Dist LM 50, pLAD 90 then 100, pCFX 50, mCFX 75, oRCA 75, dRCA 95, L-LAD patent, S-Dx 100, S-OM1 patent, S-PDA patent, EF 55% >> native Dx small caliber and not well suited for PCI; native RCA amenable to PCI and would restore flow to PLA branches but would jeopardize SVG-RCA - Med Rx  . Deviated nasal septum   . Dilated aortic root (HCC)   . Diverticulitis of sigmoid colon 07/24/2017  . DM (diabetes mellitus), type 2,  uncontrolled with complications (HCC)    onset 2013  . Dyslipidemia   . Edema 02/09/2013  . GERD (gastroesophageal reflux disease)   . Hx of Doppler ultrasound    Carotid US (9/13): No ICA stenosis  . Hypertension   . Pleural thickening 07/24/2017    ROS:   All systems reviewed and negative except as noted in the HPI.   Past Surgical History:  Procedure Laterality Date  . CORONARY ARTERY BYPASS GRAFT  10/27/2011   Procedure: CORONARY ARTERY BYPASS GRAFTING (CABG);  Surgeon: Kerin Perna, MD;  Location: [facility] OR;  Service: Open Heart Surgery;  Laterality: N/A;  Coronary Artery Bypass Grafting times four using left internal mammary artery and right greater saphenous vein endoscopically harvested  . CORONARY STENT INTERVENTION N/A 09/30/2017   Procedure: CORONARY STENT INTERVENTION;  Surgeon: Corky Crafts, MD;  Location: [facility] INVASIVE CV LAB;  Service: Cardiovascular;  Laterality: N/A;  . KNEE ARTHROSCOPY Left 2003  . KNEE ARTHROSCOPY Right 2006  . LEFT HEART CATHETERIZATION WITH CORONARY ANGIOGRAM N/A 10/23/2011   Procedure: LEFT HEART CATHETERIZATION WITH CORONARY ANGIOGRAM;  Surgeon: Quintella Reichert, MD;  Location: [facility] CATH LAB;  Service: Cardiovascular;  Laterality: N/A;  . LEFT HEART CATHETERIZATION WITH CORONARY ANGIOGRAM N/A 11/16/2013   Procedure: LEFT HEART CATHETERIZATION WITH CORONARY ANGIOGRAM;  Surgeon: Micheline Chapman, MD;  Location: [facility] CATH LAB;  Service: Cardiovascular;  Laterality: N/A;  . RIGHT/LEFT HEART CATH AND CORONARY/GRAFT ANGIOGRAPHY N/A 09/30/2017   Procedure: RIGHT/LEFT HEART CATH AND CORONARY/GRAFT ANGIOGRAPHY;  Surgeon: Corky Crafts, MD;  Location: [facility] INVASIVE CV LAB;  Service: Cardiovascular;  Laterality: N/A;     Family History  Problem Relation Age of Onset  . Hypertension Mother   . Stroke Father 66  . Hypertension Father   . Hypertension Paternal Grandfather   . Heart attack Neg Hx      Social History   Socioeconomic History  . Marital  status: Divorced    Spouse name: Not on file  . Number of children: Not on file  . Years of education: Not on file  . Highest education level: Not on file  Occupational History  . Occupation: retired  Tobacco Use  . Smoking status: Never Smoker  . Smokeless tobacco: Never Used  Substance and Sexual Activity  . Alcohol use: Yes    Comment: 09/30/2017 "1 beer a month or less"  . Drug use: Never  . Sexual activity: Not Currently  Other Topics Concern  . Not on file  Social History Narrative  . Not on file   Social Determinants of Health   Financial Resource Strain:   . Difficulty of Paying Living Expenses: Not on file  Food Insecurity:   . Worried About Programme researcher, broadcasting/film/video  in the Last Year: Not on file  . Ran Out of Food in the Last Year: Not on file  Transportation Needs:   . Lack of Transportation (Medical): Not on file  . Lack of Transportation (Non-Medical): Not on file  Physical Activity:   . Days of Exercise per Week: Not on file  . Minutes of Exercise per Session: Not on file  Stress:   . Feeling of Stress : Not on file  Social Connections:   . Frequency of Communication with Friends and Family: Not on file  . Frequency of Social Gatherings with Friends and Family: Not on file  . Attends Religious Services: Not on file  . Active Member of Clubs or Organizations: Not on file  . Attends Banker Meetings: Not on file  . Marital Status: Not on file  Intimate Partner Violence:   . Fear of Current or Ex-Partner: Not on file  . Emotionally Abused: Not on file  . Physically Abused: Not on file  . Sexually Abused: Not on file     BP (!) 142/88   Pulse 60   Ht 5\' 9"  (1.753 m)   Wt 260 lb 3.2 oz (118 kg)   SpO2 98%   BMI 38.42 kg/m   Physical Exam:  Well appearing NAD HEENT: Unremarkable Neck:  No JVD, no thyromegally Lymphatics:  No adenopathy Back:  No CVA tenderness Lungs:  Clear with no wheezes HEART:  Regular rate rhythm, no murmurs, no  rubs, no clicks Abd:  soft, positive bowel sounds, no organomegally, no rebound, no guarding Ext:  2 plus pulses, no edema, no cyanosis, no clubbing Skin:  No rashes no nodules Neuro:  CN II through XII intact, motor grossly intact   ILR insertion  After informed consent was obtained, the patient was prepped and draped in a sterile manner. 10 cc of lidocaine was infiltrated into the left pectoral region. A one cm incision was carried out. The medtronic ILR, G, was inserted into the left pectoral region. Benzoin and steristrips were painted on the skin. A bandage was applied.   Assess/Plan: 1. Cryptogenic stroke - he has undergone ILR insertion today. He will undergo watchful waiting. 2. HTN - His SBP is a bit elevated but he was a little anxious about his procedure.  [id number].

## 2019-05-05 ENCOUNTER — Ambulatory Visit (INDEPENDENT_AMBULATORY_CARE_PROVIDER_SITE_OTHER): Payer: Medicare HMO | Admitting: *Deleted

## 2019-05-05 ENCOUNTER — Other Ambulatory Visit: Payer: Self-pay

## 2019-05-05 DIAGNOSIS — I639 Cerebral infarction, unspecified: Secondary | ICD-10-CM

## 2019-05-05 LAB — CUP PACEART INCLINIC DEVICE CHECK
Date Time Interrogation Session: 20210318083946
Implantable Pulse Generator Implant Date: 20210310

## 2019-05-05 NOTE — Progress Notes (Signed)
ILR wound check in clinic. Steri strips removed. Wound well healed. R Wave 0.75mV. Home monitor set up on patient phone. No episodes. Questions answered.

## 2019-05-05 NOTE — Patient Instructions (Signed)
Please call office [phone] for concerns of redness, drainage, swelling for any concerns of infection.

## 2019-05-06 MED FILL — OZEMPIC 1 MG/DOSE SOPN: 2 | 28 days supply | Qty: 3 | Fill #1

## 2019-05-06 MED FILL — BD PEN NDL SHORT 31GX5/16: 31G X 8 MM | 50 days supply | Qty: 100 | Fill #1

## 2019-05-06 MED FILL — BD PEN NDL SHORT 31GX5/16": 31G X 8 MM | 50 days supply | Qty: 100 | Fill #1

## 2019-05-06 MED FILL — POTASSIUM CL ER 10 MEQ TAB: 10 | 30 days supply | Qty: 30 | Fill #0

## 2019-05-06 MED FILL — ATORVASTATIN 80 MG TABLET: 80 | 30 days supply | Qty: 30 | Fill #0

## 2019-05-27 MED FILL — FREESTYLE LIBRE 14 DAY SENS: 28 days supply | Qty: 2 | Fill #1

## 2019-05-27 MED FILL — GLIMEPIRIDE 4 MG TAB: 4 | 30 days supply | Qty: 30 | Fill #2

## 2019-05-27 MED FILL — TOUJEO MAX SOLOSTAR 300 UNI: 300 | 38 days supply | Qty: 3 | Fill #2

## 2019-05-31 DIAGNOSIS — I25810 Atherosclerosis of coronary artery bypass graft(s) without angina pectoris: Secondary | ICD-10-CM | POA: Diagnosis not present

## 2019-05-31 DIAGNOSIS — E11319 Type 2 diabetes mellitus with unspecified diabetic retinopathy without macular edema: Secondary | ICD-10-CM | POA: Diagnosis not present

## 2019-05-31 DIAGNOSIS — E119 Type 2 diabetes mellitus without complications: Secondary | ICD-10-CM | POA: Diagnosis not present

## 2019-05-31 DIAGNOSIS — E1165 Type 2 diabetes mellitus with hyperglycemia: Secondary | ICD-10-CM | POA: Diagnosis not present

## 2019-06-06 ENCOUNTER — Ambulatory Visit (INDEPENDENT_AMBULATORY_CARE_PROVIDER_SITE_OTHER): Payer: Medicare HMO | Admitting: *Deleted

## 2019-06-06 DIAGNOSIS — I639 Cerebral infarction, unspecified: Secondary | ICD-10-CM

## 2019-06-06 LAB — CUP PACEART REMOTE DEVICE CHECK
Date Time Interrogation Session: 20210418082317
Implantable Pulse Generator Implant Date: 20210310

## 2019-06-06 NOTE — Progress Notes (Signed)
ILR Remote 

## 2019-06-27 ENCOUNTER — Other Ambulatory Visit (HOSPITAL_BASED_OUTPATIENT_CLINIC_OR_DEPARTMENT_OTHER): Payer: Self-pay | Admitting: Family Medicine

## 2019-06-27 DIAGNOSIS — E782 Mixed hyperlipidemia: Secondary | ICD-10-CM | POA: Diagnosis not present

## 2019-06-27 DIAGNOSIS — E1121 Type 2 diabetes mellitus with diabetic nephropathy: Secondary | ICD-10-CM | POA: Diagnosis not present

## 2019-06-27 DIAGNOSIS — Z794 Long term (current) use of insulin: Secondary | ICD-10-CM | POA: Diagnosis not present

## 2019-06-27 DIAGNOSIS — N183 Chronic kidney disease, stage 3 unspecified: Secondary | ICD-10-CM | POA: Diagnosis not present

## 2019-06-27 DIAGNOSIS — I25118 Atherosclerotic heart disease of native coronary artery with other forms of angina pectoris: Secondary | ICD-10-CM | POA: Diagnosis not present

## 2019-06-27 DIAGNOSIS — I10 Essential (primary) hypertension: Secondary | ICD-10-CM | POA: Diagnosis not present

## 2019-07-06 LAB — CUP PACEART REMOTE DEVICE CHECK
Date Time Interrogation Session: 20210519082538
Implantable Pulse Generator Implant Date: 20210310

## 2019-07-11 ENCOUNTER — Ambulatory Visit (INDEPENDENT_AMBULATORY_CARE_PROVIDER_SITE_OTHER): Payer: Medicare HMO | Admitting: *Deleted

## 2019-07-11 DIAGNOSIS — I639 Cerebral infarction, unspecified: Secondary | ICD-10-CM | POA: Diagnosis not present

## 2019-07-11 NOTE — Progress Notes (Signed)
Carelink Summary Report / Loop Recorder 

## 2019-07-12 MED FILL — PRAMIPEXOLE 0.75 MG TABLET: 0.75 | 90 days supply | Qty: 180 | Fill #0

## 2019-07-14 DIAGNOSIS — I130 Hypertensive heart and chronic kidney disease with heart failure and stage 1 through stage 4 chronic kidney disease, or unspecified chronic kidney disease: Secondary | ICD-10-CM | POA: Diagnosis not present

## 2019-07-14 DIAGNOSIS — I5032 Chronic diastolic (congestive) heart failure: Secondary | ICD-10-CM | POA: Diagnosis not present

## 2019-07-14 DIAGNOSIS — I25119 Atherosclerotic heart disease of native coronary artery with unspecified angina pectoris: Secondary | ICD-10-CM | POA: Diagnosis not present

## 2019-07-14 DIAGNOSIS — I25118 Atherosclerotic heart disease of native coronary artery with other forms of angina pectoris: Secondary | ICD-10-CM | POA: Diagnosis not present

## 2019-07-14 DIAGNOSIS — I10 Essential (primary) hypertension: Secondary | ICD-10-CM | POA: Diagnosis not present

## 2019-07-14 DIAGNOSIS — I5022 Chronic systolic (congestive) heart failure: Secondary | ICD-10-CM | POA: Diagnosis not present

## 2019-07-14 DIAGNOSIS — E782 Mixed hyperlipidemia: Secondary | ICD-10-CM | POA: Diagnosis not present

## 2019-07-14 DIAGNOSIS — E1121 Type 2 diabetes mellitus with diabetic nephropathy: Secondary | ICD-10-CM | POA: Diagnosis not present

## 2019-07-14 DIAGNOSIS — H35033 Hypertensive retinopathy, bilateral: Secondary | ICD-10-CM | POA: Diagnosis not present

## 2019-07-14 MED FILL — TOUJEO MAX SOLOSTAR 300 UNI: 300 | 38 days supply | Qty: 3 | Fill #3

## 2019-08-08 DIAGNOSIS — H35033 Hypertensive retinopathy, bilateral: Secondary | ICD-10-CM | POA: Diagnosis not present

## 2019-08-08 DIAGNOSIS — I130 Hypertensive heart and chronic kidney disease with heart failure and stage 1 through stage 4 chronic kidney disease, or unspecified chronic kidney disease: Secondary | ICD-10-CM | POA: Diagnosis not present

## 2019-08-08 DIAGNOSIS — E1121 Type 2 diabetes mellitus with diabetic nephropathy: Secondary | ICD-10-CM | POA: Diagnosis not present

## 2019-08-08 DIAGNOSIS — I25118 Atherosclerotic heart disease of native coronary artery with other forms of angina pectoris: Secondary | ICD-10-CM | POA: Diagnosis not present

## 2019-08-08 DIAGNOSIS — I5022 Chronic systolic (congestive) heart failure: Secondary | ICD-10-CM | POA: Diagnosis not present

## 2019-08-08 DIAGNOSIS — I25119 Atherosclerotic heart disease of native coronary artery with unspecified angina pectoris: Secondary | ICD-10-CM | POA: Diagnosis not present

## 2019-08-08 DIAGNOSIS — E782 Mixed hyperlipidemia: Secondary | ICD-10-CM | POA: Diagnosis not present

## 2019-08-08 DIAGNOSIS — I10 Essential (primary) hypertension: Secondary | ICD-10-CM | POA: Diagnosis not present

## 2019-08-08 DIAGNOSIS — E7800 Pure hypercholesterolemia, unspecified: Secondary | ICD-10-CM | POA: Diagnosis not present

## 2019-08-15 ENCOUNTER — Ambulatory Visit (INDEPENDENT_AMBULATORY_CARE_PROVIDER_SITE_OTHER): Payer: Medicare HMO | Admitting: *Deleted

## 2019-08-15 DIAGNOSIS — I639 Cerebral infarction, unspecified: Secondary | ICD-10-CM | POA: Diagnosis not present

## 2019-08-15 LAB — CUP PACEART REMOTE DEVICE CHECK
Date Time Interrogation Session: 20210627230534
Implantable Pulse Generator Implant Date: 20210310

## 2019-08-15 NOTE — Progress Notes (Signed)
Carelink Summary Report / Loop Recorder 

## 2019-08-22 MED FILL — metFORMIN HCL ER 500 MG TB2: 500 | 30 days supply | Qty: 60 | Fill #1

## 2019-08-22 MED FILL — BD PEN NDL SHORT 31GX5/16: 31G X 8 MM | 50 days supply | Qty: 100 | Fill #3

## 2019-08-30 MED FILL — FREESTYLE LIBRE 14 DAY SENS: 28 days supply | Qty: 2 | Fill #2

## 2019-08-31 DIAGNOSIS — I25810 Atherosclerosis of coronary artery bypass graft(s) without angina pectoris: Secondary | ICD-10-CM | POA: Diagnosis not present

## 2019-08-31 DIAGNOSIS — E1165 Type 2 diabetes mellitus with hyperglycemia: Secondary | ICD-10-CM | POA: Diagnosis not present

## 2019-08-31 DIAGNOSIS — N183 Chronic kidney disease, stage 3 unspecified: Secondary | ICD-10-CM | POA: Diagnosis not present

## 2019-08-31 DIAGNOSIS — E119 Type 2 diabetes mellitus without complications: Secondary | ICD-10-CM | POA: Diagnosis not present

## 2019-08-31 MED FILL — OZEMPIC 0.25 OR 0.5 MG/DOSE: 2 | 56 days supply | Qty: 3 | Fill #0

## 2019-09-07 ENCOUNTER — Other Ambulatory Visit: Payer: Self-pay | Admitting: Cardiology

## 2019-09-07 ENCOUNTER — Other Ambulatory Visit: Payer: Self-pay | Admitting: Physician Assistant

## 2019-09-07 ENCOUNTER — Other Ambulatory Visit: Payer: Self-pay | Admitting: Medical

## 2019-09-07 DIAGNOSIS — I5022 Chronic systolic (congestive) heart failure: Secondary | ICD-10-CM

## 2019-09-07 DIAGNOSIS — R413 Other amnesia: Secondary | ICD-10-CM | POA: Diagnosis not present

## 2019-09-07 MED FILL — metFORMIN HCL ER 500 MG TB2: 500 | 30 days supply | Qty: 60 | Fill #2

## 2019-09-07 MED FILL — CLOPIDOGREL 75 MG TABLET: 75 | 30 days supply | Qty: 30 | Fill #1

## 2019-09-07 MED FILL — METOPROLOL SUCCINATE ER 100: 100 | 90 days supply | Qty: 90 | Fill #0

## 2019-09-08 ENCOUNTER — Other Ambulatory Visit: Payer: Self-pay | Admitting: Cardiology

## 2019-09-08 MED FILL — POTASSIUM CL ER 10 MEQ TAB: 10 | 30 days supply | Qty: 30 | Fill #0

## 2019-09-08 MED FILL — TORSEMIDE 20 MG TABLET: 20 | 30 days supply | Qty: 30 | Fill #0

## 2019-09-08 MED FILL — ISOSORBIDE MN ER 60 MG TAB: 60 | 90 days supply | Qty: 90 | Fill #0

## 2019-09-08 MED FILL — EZETIMIBE 10 MG TABS: 10 | 30 days supply | Qty: 30 | Fill #0

## 2019-09-08 NOTE — Telephone Encounter (Signed)
Okay to refill atorvastatin. Brilinta has been discontinued.  Appointment was made correctly, thanks!!!

## 2019-09-09 ENCOUNTER — Encounter (HOSPITAL_COMMUNITY): Payer: Self-pay | Admitting: Emergency Medicine

## 2019-09-09 ENCOUNTER — Emergency Department (HOSPITAL_COMMUNITY): Payer: Medicare HMO

## 2019-09-09 ENCOUNTER — Other Ambulatory Visit: Payer: Self-pay

## 2019-09-09 ENCOUNTER — Inpatient Hospital Stay (HOSPITAL_COMMUNITY)
Admission: EM | Admit: 2019-09-09 | Discharge: 2019-09-13 | DRG: 291 | Disposition: A | Discharge status: Home Health | Payer: Medicare HMO | Attending: Internal Medicine | Admitting: Internal Medicine

## 2019-09-09 DIAGNOSIS — Z955 Presence of coronary angioplasty implant and graft: Secondary | ICD-10-CM

## 2019-09-09 DIAGNOSIS — I639 Cerebral infarction, unspecified: Secondary | ICD-10-CM | POA: Diagnosis present

## 2019-09-09 DIAGNOSIS — W19XXXA Unspecified fall, initial encounter: Secondary | ICD-10-CM

## 2019-09-09 DIAGNOSIS — Z8673 Personal history of transient ischemic attack (TIA), and cerebral infarction without residual deficits: Secondary | ICD-10-CM

## 2019-09-09 DIAGNOSIS — R4182 Altered mental status, unspecified: Secondary | ICD-10-CM | POA: Diagnosis not present

## 2019-09-09 DIAGNOSIS — E1122 Type 2 diabetes mellitus with diabetic chronic kidney disease: Secondary | ICD-10-CM | POA: Diagnosis present

## 2019-09-09 DIAGNOSIS — Z794 Long term (current) use of insulin: Secondary | ICD-10-CM

## 2019-09-09 DIAGNOSIS — J90 Pleural effusion, not elsewhere classified: Secondary | ICD-10-CM | POA: Diagnosis not present

## 2019-09-09 DIAGNOSIS — I2510 Atherosclerotic heart disease of native coronary artery without angina pectoris: Secondary | ICD-10-CM | POA: Diagnosis not present

## 2019-09-09 DIAGNOSIS — N183 Chronic kidney disease, stage 3 unspecified: Secondary | ICD-10-CM | POA: Diagnosis present

## 2019-09-09 DIAGNOSIS — E118 Type 2 diabetes mellitus with unspecified complications: Secondary | ICD-10-CM | POA: Diagnosis not present

## 2019-09-09 DIAGNOSIS — I5023 Acute on chronic systolic (congestive) heart failure: Secondary | ICD-10-CM | POA: Diagnosis not present

## 2019-09-09 DIAGNOSIS — Z20822 Contact with and (suspected) exposure to covid-19: Secondary | ICD-10-CM | POA: Diagnosis not present

## 2019-09-09 DIAGNOSIS — N1831 Chronic kidney disease, stage 3a: Secondary | ICD-10-CM | POA: Diagnosis not present

## 2019-09-09 DIAGNOSIS — J342 Deviated nasal septum: Secondary | ICD-10-CM | POA: Diagnosis present

## 2019-09-09 DIAGNOSIS — R2681 Unsteadiness on feet: Secondary | ICD-10-CM | POA: Diagnosis not present

## 2019-09-09 DIAGNOSIS — K219 Gastro-esophageal reflux disease without esophagitis: Secondary | ICD-10-CM | POA: Diagnosis present

## 2019-09-09 DIAGNOSIS — M6281 Muscle weakness (generalized): Secondary | ICD-10-CM | POA: Diagnosis not present

## 2019-09-09 DIAGNOSIS — R200 Anesthesia of skin: Secondary | ICD-10-CM | POA: Diagnosis not present

## 2019-09-09 DIAGNOSIS — I130 Hypertensive heart and chronic kidney disease with heart failure and stage 1 through stage 4 chronic kidney disease, or unspecified chronic kidney disease: Principal | ICD-10-CM | POA: Diagnosis present

## 2019-09-09 DIAGNOSIS — Z888 Allergy status to other drugs, medicaments and biological substances status: Secondary | ICD-10-CM

## 2019-09-09 DIAGNOSIS — I509 Heart failure, unspecified: Secondary | ICD-10-CM

## 2019-09-09 DIAGNOSIS — J9811 Atelectasis: Secondary | ICD-10-CM | POA: Diagnosis not present

## 2019-09-09 DIAGNOSIS — I712 Thoracic aortic aneurysm, without rupture: Secondary | ICD-10-CM | POA: Diagnosis present

## 2019-09-09 DIAGNOSIS — I700 Atherosclerosis of aorta: Secondary | ICD-10-CM | POA: Diagnosis present

## 2019-09-09 DIAGNOSIS — I878 Other specified disorders of veins: Secondary | ICD-10-CM | POA: Diagnosis present

## 2019-09-09 DIAGNOSIS — W1811XA Fall from or off toilet without subsequent striking against object, initial encounter: Secondary | ICD-10-CM | POA: Diagnosis present

## 2019-09-09 DIAGNOSIS — R0602 Shortness of breath: Secondary | ICD-10-CM | POA: Diagnosis not present

## 2019-09-09 DIAGNOSIS — M1990 Unspecified osteoarthritis, unspecified site: Secondary | ICD-10-CM | POA: Diagnosis present

## 2019-09-09 DIAGNOSIS — I872 Venous insufficiency (chronic) (peripheral): Secondary | ICD-10-CM | POA: Diagnosis present

## 2019-09-09 DIAGNOSIS — E669 Obesity, unspecified: Secondary | ICD-10-CM | POA: Diagnosis present

## 2019-09-09 DIAGNOSIS — I517 Cardiomegaly: Secondary | ICD-10-CM | POA: Diagnosis not present

## 2019-09-09 DIAGNOSIS — E1165 Type 2 diabetes mellitus with hyperglycemia: Secondary | ICD-10-CM | POA: Diagnosis not present

## 2019-09-09 DIAGNOSIS — R296 Repeated falls: Secondary | ICD-10-CM | POA: Diagnosis not present

## 2019-09-09 DIAGNOSIS — Z66 Do not resuscitate: Secondary | ICD-10-CM | POA: Diagnosis present

## 2019-09-09 DIAGNOSIS — Z9181 History of falling: Secondary | ICD-10-CM | POA: Diagnosis not present

## 2019-09-09 DIAGNOSIS — I5022 Chronic systolic (congestive) heart failure: Secondary | ICD-10-CM

## 2019-09-09 DIAGNOSIS — R5381 Other malaise: Secondary | ICD-10-CM | POA: Diagnosis not present

## 2019-09-09 DIAGNOSIS — Z7982 Long term (current) use of aspirin: Secondary | ICD-10-CM

## 2019-09-09 DIAGNOSIS — Z23 Encounter for immunization: Secondary | ICD-10-CM

## 2019-09-09 DIAGNOSIS — Z7902 Long term (current) use of antithrombotics/antiplatelets: Secondary | ICD-10-CM

## 2019-09-09 DIAGNOSIS — I5031 Acute diastolic (congestive) heart failure: Secondary | ICD-10-CM | POA: Diagnosis not present

## 2019-09-09 DIAGNOSIS — Z9114 Patient's other noncompliance with medication regimen: Secondary | ICD-10-CM

## 2019-09-09 DIAGNOSIS — Z951 Presence of aortocoronary bypass graft: Secondary | ICD-10-CM | POA: Diagnosis not present

## 2019-09-09 DIAGNOSIS — I110 Hypertensive heart disease with heart failure: Secondary | ICD-10-CM | POA: Diagnosis not present

## 2019-09-09 DIAGNOSIS — Z823 Family history of stroke: Secondary | ICD-10-CM

## 2019-09-09 DIAGNOSIS — I5043 Acute on chronic combined systolic (congestive) and diastolic (congestive) heart failure: Secondary | ICD-10-CM | POA: Diagnosis present

## 2019-09-09 DIAGNOSIS — E785 Hyperlipidemia, unspecified: Secondary | ICD-10-CM | POA: Diagnosis present

## 2019-09-09 DIAGNOSIS — Z8249 Family history of ischemic heart disease and other diseases of the circulatory system: Secondary | ICD-10-CM

## 2019-09-09 DIAGNOSIS — Y92002 Bathroom of unspecified non-institutional (private) residence single-family (private) house as the place of occurrence of the external cause: Secondary | ICD-10-CM | POA: Diagnosis not present

## 2019-09-09 DIAGNOSIS — R69 Illness, unspecified: Secondary | ICD-10-CM | POA: Diagnosis not present

## 2019-09-09 DIAGNOSIS — Z6835 Body mass index (BMI) 35.0-35.9, adult: Secondary | ICD-10-CM | POA: Diagnosis not present

## 2019-09-09 DIAGNOSIS — IMO0002 Reserved for concepts with insufficient information to code with codable children: Secondary | ICD-10-CM | POA: Diagnosis present

## 2019-09-09 DIAGNOSIS — S0001XA Abrasion of scalp, initial encounter: Secondary | ICD-10-CM | POA: Diagnosis not present

## 2019-09-09 DIAGNOSIS — Z79899 Other long term (current) drug therapy: Secondary | ICD-10-CM

## 2019-09-09 DIAGNOSIS — N400 Enlarged prostate without lower urinary tract symptoms: Secondary | ICD-10-CM | POA: Diagnosis present

## 2019-09-09 DIAGNOSIS — I5041 Acute combined systolic (congestive) and diastolic (congestive) heart failure: Secondary | ICD-10-CM | POA: Diagnosis not present

## 2019-09-09 LAB — CBC WITH DIFFERENTIAL/PLATELET
Abs Immature Granulocytes: 0.04 10*3/uL (ref 0.00–0.07)
Basophils Absolute: 0.1 10*3/uL (ref 0.0–0.1)
Basophils Relative: 1 %
Eosinophils Absolute: 0.3 10*3/uL (ref 0.0–0.5)
Eosinophils Relative: 4 %
HCT: 45.5 % (ref 39.0–52.0)
Hemoglobin: 14.8 g/dL (ref 13.0–17.0)
Immature Granulocytes: 1 %
Lymphocytes Relative: 13 %
Lymphs Abs: 0.9 10*3/uL (ref 0.7–4.0)
MCH: 28.6 pg (ref 26.0–34.0)
MCHC: 32.5 g/dL (ref 30.0–36.0)
MCV: 88 fL (ref 80.0–100.0)
Monocytes Absolute: 0.9 10*3/uL (ref 0.1–1.0)
Monocytes Relative: 14 %
Neutro Abs: 4.6 10*3/uL (ref 1.7–7.7)
Neutrophils Relative %: 67 %
Platelets: 209 10*3/uL (ref 150–400)
RBC: 5.17 MIL/uL (ref 4.22–5.81)
RDW: 13.1 % (ref 11.5–15.5)
WBC: 6.8 10*3/uL (ref 4.0–10.5)
nRBC: 0 % (ref 0.0–0.2)

## 2019-09-09 LAB — TSH: TSH: 1.237 u[IU]/mL (ref 0.350–4.500)

## 2019-09-09 LAB — CBC
HCT: 45.3 % (ref 39.0–52.0)
Hemoglobin: 14.9 g/dL (ref 13.0–17.0)
MCH: 29.4 pg (ref 26.0–34.0)
MCHC: 32.9 g/dL (ref 30.0–36.0)
MCV: 89.5 fL (ref 80.0–100.0)
Platelets: 222 10*3/uL (ref 150–400)
RBC: 5.06 MIL/uL (ref 4.22–5.81)
RDW: 13 % (ref 11.5–15.5)
WBC: 8.1 10*3/uL (ref 4.0–10.5)
nRBC: 0 % (ref 0.0–0.2)

## 2019-09-09 LAB — COMPREHENSIVE METABOLIC PANEL
ALT: 16 U/L (ref 0–44)
ALT: 16 U/L (ref 0–44)
AST: 21 U/L (ref 15–41)
AST: 18 U/L (ref 15–41)
Albumin: 3.9 g/dL (ref 3.5–5.0)
Albumin: 3.7 g/dL (ref 3.5–5.0)
Alkaline Phosphatase: 72 U/L (ref 38–126)
Alkaline Phosphatase: 67 U/L (ref 38–126)
Anion gap: 11 (ref 5–15)
Anion gap: 10 (ref 5–15)
BUN: 20 mg/dL (ref 8–23)
BUN: 19 mg/dL (ref 8–23)
CO2: 25 mmol/L (ref 22–32)
CO2: 24 mmol/L (ref 22–32)
Calcium: 9.2 mg/dL (ref 8.9–10.3)
Calcium: 9.1 mg/dL (ref 8.9–10.3)
Chloride: 102 mmol/L (ref 98–111)
Chloride: 103 mmol/L (ref 98–111)
Creatinine, Ser: 1.51 mg/dL — ABNORMAL HIGH (ref 0.61–1.24)
Creatinine, Ser: 1.48 mg/dL — ABNORMAL HIGH (ref 0.61–1.24)
GFR calc Af Amer: 52 mL/min — ABNORMAL LOW (ref >60)
GFR calc Af Amer: 53 mL/min — ABNORMAL LOW (ref >60)
GFR calc non Af Amer: 45 mL/min — ABNORMAL LOW (ref >60)
GFR calc non Af Amer: 46 mL/min — ABNORMAL LOW (ref >60)
Glucose, Bld: 117 mg/dL — ABNORMAL HIGH (ref 70–99)
Glucose, Bld: 118 mg/dL — ABNORMAL HIGH (ref 70–99)
Potassium: 3.7 mmol/L (ref 3.5–5.1)
Potassium: 3.6 mmol/L (ref 3.5–5.1)
Sodium: 138 mmol/L (ref 135–145)
Sodium: 137 mmol/L (ref 135–145)
Total Bilirubin: 0.9 mg/dL (ref 0.3–1.2)
Total Bilirubin: 1.1 mg/dL (ref 0.3–1.2)
Total Protein: 7 g/dL (ref 6.5–8.1)
Total Protein: 6.9 g/dL (ref 6.5–8.1)

## 2019-09-09 LAB — PROTIME-INR
INR: 1 (ref 0.8–1.2)
Prothrombin Time: 13.1 seconds (ref 11.4–15.2)

## 2019-09-09 LAB — MAGNESIUM: Magnesium: 2 mg/dL (ref 1.7–2.4)

## 2019-09-09 LAB — URINALYSIS, ROUTINE W REFLEX MICROSCOPIC
Bacteria, UA: NONE SEEN
Bilirubin Urine: NEGATIVE
Glucose, UA: NEGATIVE mg/dL
Hgb urine dipstick: NEGATIVE
Ketones, ur: NEGATIVE mg/dL
Leukocytes,Ua: NEGATIVE
Nitrite: NEGATIVE
Protein, ur: 100 mg/dL — AB
Specific Gravity, Urine: 1.01 (ref 1.005–1.030)
pH: 6 (ref 5.0–8.0)

## 2019-09-09 LAB — CBG MONITORING, ED
Glucose-Capillary: 121 mg/dL — ABNORMAL HIGH (ref 70–99)
Glucose-Capillary: 124 mg/dL — ABNORMAL HIGH (ref 70–99)
Glucose-Capillary: 201 mg/dL — ABNORMAL HIGH (ref 70–99)

## 2019-09-09 LAB — GLUCOSE, CAPILLARY
Glucose-Capillary: 126 mg/dL — ABNORMAL HIGH (ref 70–99)
Glucose-Capillary: 113 mg/dL — ABNORMAL HIGH (ref 70–99)

## 2019-09-09 LAB — SARS CORONAVIRUS 2 BY RT PCR (HOSPITAL ORDER, PERFORMED IN ~~LOC~~ HOSPITAL LAB): SARS Coronavirus 2: NEGATIVE

## 2019-09-09 LAB — SAMPLE TO BLOOD BANK

## 2019-09-09 LAB — BRAIN NATRIURETIC PEPTIDE: B Natriuretic Peptide: 175.1 pg/mL — ABNORMAL HIGH (ref 0.0–100.0)

## 2019-09-09 LAB — LACTIC ACID, PLASMA: Lactic Acid, Venous: 1.4 mmol/L (ref 0.5–1.9)

## 2019-09-09 LAB — ETHANOL: Alcohol, Ethyl (B): <10 mg/dL (ref <10)

## 2019-09-09 MED ORDER — ENOXAPARIN SODIUM 40 MG/0.4ML ~~LOC~~ SOLN
40.0000 mg | SUBCUTANEOUS | Status: DC
  Administered 2019-09-09 – 2019-09-13 (×5): 40 mg via SUBCUTANEOUS
  Filled 2019-09-09 (×5): qty 0.4

## 2019-09-09 MED ORDER — HYDRALAZINE HCL 25 MG PO TABS
25.0000 mg | ORAL_TABLET | Freq: Three times a day (TID) | ORAL | Status: DC
  Administered 2019-09-09 – 2019-09-13 (×14): 25 mg via ORAL
  Filled 2019-09-09 (×15): qty 1

## 2019-09-09 MED ORDER — FUROSEMIDE 10 MG/ML IJ SOLN
20.0000 mg | Freq: Two times a day (BID) | INTRAMUSCULAR | Status: DC
  Administered 2019-09-09 – 2019-09-10 (×2): 20 mg via INTRAVENOUS
  Filled 2019-09-09 (×2): qty 2

## 2019-09-09 MED ORDER — POTASSIUM CHLORIDE CRYS ER 10 MEQ PO TBCR
10.0000 meq | EXTENDED_RELEASE_TABLET | Freq: Every day | ORAL | Status: DC
  Administered 2019-09-09 – 2019-09-13 (×5): 10 meq via ORAL
  Filled 2019-09-09 (×5): qty 1

## 2019-09-09 MED ORDER — FUROSEMIDE 10 MG/ML IJ SOLN
40.0000 mg | Freq: Once | INTRAMUSCULAR | Status: AC
  Administered 2019-09-09: 40 mg via INTRAVENOUS
  Filled 2019-09-09: qty 4

## 2019-09-09 MED ORDER — CLOPIDOGREL BISULFATE 75 MG PO TABS
75.0000 mg | ORAL_TABLET | Freq: Every day | ORAL | Status: DC
  Administered 2019-09-09 – 2019-09-13 (×5): 75 mg via ORAL
  Filled 2019-09-09 (×5): qty 1

## 2019-09-09 MED ORDER — ATORVASTATIN CALCIUM 80 MG PO TABS
80.0000 mg | ORAL_TABLET | Freq: Every day | ORAL | Status: DC
  Administered 2019-09-09 – 2019-09-13 (×5): 80 mg via ORAL
  Filled 2019-09-09 (×5): qty 1

## 2019-09-09 MED ORDER — ONDANSETRON HCL 4 MG/2ML IJ SOLN: 4.0000 mg | Freq: Four times a day (QID) | INTRAMUSCULAR | Status: DC | PRN

## 2019-09-09 MED ORDER — POLYETHYLENE GLYCOL 3350 17 G PO PACK
17.0000 g | PACK | Freq: Every day | ORAL | Status: DC
  Administered 2019-09-09 – 2019-09-13 (×4): 17 g via ORAL
  Filled 2019-09-09 (×5): qty 1

## 2019-09-09 MED ORDER — POLYSACCHARIDE IRON COMPLEX 150 MG PO CAPS
150.0000 mg | ORAL_CAPSULE | Freq: Every day | ORAL | Status: DC
  Administered 2019-09-10 – 2019-09-13 (×4): 150 mg via ORAL
  Filled 2019-09-09 (×5): qty 1

## 2019-09-09 MED ORDER — NITROGLYCERIN 0.4 MG SL SUBL: 0.4000 mg | SUBLINGUAL_TABLET | SUBLINGUAL | Status: DC | PRN

## 2019-09-09 MED ORDER — INSULIN ASPART 100 UNIT/ML ~~LOC~~ SOLN
0.0000 [IU] | Freq: Three times a day (TID) | SUBCUTANEOUS | Status: DC
  Administered 2019-09-09 (×2): 1 [IU] via SUBCUTANEOUS
  Administered 2019-09-09: 3 [IU] via SUBCUTANEOUS
  Administered 2019-09-10: 1 [IU] via SUBCUTANEOUS
  Administered 2019-09-11: 2 [IU] via SUBCUTANEOUS
  Administered 2019-09-11: 1 [IU] via SUBCUTANEOUS
  Administered 2019-09-13 (×2): 2 [IU] via SUBCUTANEOUS

## 2019-09-09 MED ORDER — ISOSORBIDE MONONITRATE ER 60 MG PO TB24
60.0000 mg | ORAL_TABLET | Freq: Every day | ORAL | Status: DC
  Administered 2019-09-09 – 2019-09-13 (×5): 60 mg via ORAL
  Filled 2019-09-09: qty 1
  Filled 2019-09-09: qty 2
  Filled 2019-09-09 (×3): qty 1

## 2019-09-09 MED ORDER — INSULIN ASPART PROT & ASPART (70-30 MIX) 100 UNIT/ML ~~LOC~~ SUSP
8.0000 [IU] | Freq: Two times a day (BID) | SUBCUTANEOUS | Status: DC
  Administered 2019-09-09 – 2019-09-10 (×2): 8 [IU] via SUBCUTANEOUS
  Filled 2019-09-09: qty 10

## 2019-09-09 MED ORDER — INSULIN GLARGINE 100 UNIT/ML ~~LOC~~ SOLN
22.0000 [IU] | Freq: Every day | SUBCUTANEOUS | Status: DC
  Administered 2019-09-09 – 2019-09-12 (×4): 22 [IU] via SUBCUTANEOUS
  Filled 2019-09-09 (×5): qty 0.22

## 2019-09-09 MED ORDER — ASPIRIN 81 MG PO CHEW
81.0000 mg | CHEWABLE_TABLET | Freq: Every day | ORAL | Status: DC
  Administered 2019-09-09 – 2019-09-13 (×5): 81 mg via ORAL
  Filled 2019-09-09 (×5): qty 1

## 2019-09-09 MED ORDER — EZETIMIBE 10 MG PO TABS
10.0000 mg | ORAL_TABLET | Freq: Every day | ORAL | Status: DC
  Administered 2019-09-09 – 2019-09-13 (×5): 10 mg via ORAL
  Filled 2019-09-09 (×5): qty 1

## 2019-09-09 MED ORDER — PRAMIPEXOLE DIHYDROCHLORIDE 1.5 MG PO TABS
1.5000 mg | ORAL_TABLET | Freq: Every evening | ORAL | Status: DC
  Administered 2019-09-09 – 2019-09-13 (×5): 1.5 mg via ORAL
  Filled 2019-09-09 (×5): qty 1

## 2019-09-09 MED ORDER — METOPROLOL SUCCINATE ER 50 MG PO TB24
50.0000 mg | ORAL_TABLET | Freq: Every day | ORAL | Status: DC
  Administered 2019-09-09 – 2019-09-13 (×5): 50 mg via ORAL
  Filled 2019-09-09 (×2): qty 1
  Filled 2019-09-09: qty 2
  Filled 2019-09-09 (×2): qty 1

## 2019-09-09 MED ORDER — ONDANSETRON HCL 4 MG PO TABS: 4.0000 mg | ORAL_TABLET | Freq: Four times a day (QID) | ORAL | Status: DC | PRN

## 2019-09-09 MED ORDER — OMEGA-3-ACID ETHYL ESTERS 1 G PO CAPS
2000.0000 mg | ORAL_CAPSULE | Freq: Every day | ORAL | Status: DC
  Administered 2019-09-09 – 2019-09-13 (×5): 2000 mg via ORAL
  Filled 2019-09-09 (×5): qty 2

## 2019-09-09 MED ORDER — TETANUS-DIPHTH-ACELL PERTUSSIS 5-2.5-18.5 LF-MCG/0.5 IM SUSP
0.5000 mL | Freq: Once | INTRAMUSCULAR | Status: AC
  Administered 2019-09-09: 0.5 mL via INTRAMUSCULAR
  Filled 2019-09-09: qty 0.5

## 2019-09-09 MED FILL — ATORVASTATIN 80 MG TABLET: 80 | 30 days supply | Qty: 30 | Fill #0

## 2019-09-09 NOTE — ED Notes (Signed)
Patient's cell phone left by daughter. Placed at bedside.

## 2019-09-09 NOTE — ED Provider Notes (Signed)
[facility] EMERGENCY DEPARTMENT Provider Note   CSN: [id number] Arrival date & time: 09/09/19  0112     History Chief Complaint  Patient presents with  . Fall    James [name] is a 74 y.o. male with a history of cryptogenic stroke in 2020, chronic systolic and diastolic heart failure, CKD stage III, CAD, ascending aortic aneurysm, and hypertension who presents to the emergency department by EMS with a chief complaint of fall.  He reports that he was getting off the toilet when his feet slipped out from under him and he fell and landed on his buttocks.  He has a wound to the scalp, and thinks that he may have hit the tile in the bathroom.  He denies syncope, nausea, vomiting, or headache.  He denies any neck pain, back pain, hip pain.  Reports that he is feeling at his baseline and denies chest pain, shortness of breath, abdominal pain or distention, worse than baseline leg swelling, numbness, weakness.  He has no complaints at this time.  Reports that he stopped his home torsemide approximately 3 weeks ago due to frequent urination, which he felt impacted him from being able to walk his grandson's dog.  He lives alone.  He ambulates with a cane.  He believes the year to be 2013.  Per the patient's daughter, he was last seen by his PCP yesterday.  Level 5 caveat secondary to altered mental status.   The history is provided by the patient and medical records. No language interpreter was used.       Past Medical History:  Diagnosis Date  . Abnormal EKG 03/09/2013  . Abnormal nuclear cardiac imaging test 11/14/2013  . Acute lacunar infarction (HCC) 02/06/2019  . Aortic atherosclerosis (HCC) 07/07/2017  . Arthritis    "all over" (09/30/2017)  . Ascending aortic aneurysm (HCC)    cMRI (2/15): Normal LV EF 54%, Mild BAE, Trileaflet Aortic Valve, Upper limits of normal ascending aorta 3.8 cm, Normal aortic arch 2.3 cm with bovine origin of left carotid  . Atrial flutter  (HCC)    Post-op with no reoccurence  . Chronic diastolic heart failure (HCC)    Echo (1/15): Left ventricle: The cavity size was mildly dilated. Mild LVH. EF 55% to 60%. Wall motion was normal; Gr 1 diastolic dysfunction Left atrium: The atrium was mildly dilated. Right atrium: The atrium was mildly to moderately dilated. Aortic arch measures 4.8 cm (moderately dilated); suggest CTA or MRA to better assess.  . Chronic systolic CHF (congestive heart failure) (HCC) 07/24/2017  . CKD (chronic kidney disease), stage III 07/24/2017  . Coronary artery disease 10/2011   a. s/p cabg;  b. Myoview (9/15):  ant ischemia, poss TID, EF 43%, high risk >> LHC (9/15): Dist LM 50, pLAD 90 then 100, pCFX 50, mCFX 75, oRCA 75, dRCA 95, L-LAD patent, S-Dx 100, S-OM1 patent, S-PDA patent, EF 55% >> native Dx small caliber and not well suited for PCI; native RCA amenable to PCI and would restore flow to PLA branches but would jeopardize SVG-RCA - Med Rx  . Deviated nasal septum   . Dilated aortic root (HCC)   . Diverticulitis of sigmoid colon 07/24/2017  . DM (diabetes mellitus), type 2, uncontrolled with complications (HCC)    onset 2013  . Dyslipidemia   . Edema 02/09/2013  . GERD (gastroesophageal reflux disease)   . Hx of Doppler ultrasound    Carotid US (9/13): No ICA stenosis  . Hypertension   .  Pleural thickening 07/24/2017    Patient Active Problem List   Diagnosis Date Noted  . Acute CHF (congestive heart failure) (HCC) 09/09/2019  . Cryptogenic stroke (HCC) 04/27/2019  . Left sided numbness 02/06/2019  . Acute lacunar infarction (HCC) 02/06/2019  . Obesity, Class II, BMI 35-39.9 02/06/2019  . Dyslipidemia   . Ischemic cardiomyopathy 10/12/2017  . Mild pulmonary hypertension (HCC) 10/12/2017  . S/P CABG (coronary artery bypass graft) 10/01/2017  . Status post coronary artery stent placement   . Left ventricular systolic dysfunction 09/30/2017  . Diverticulitis of sigmoid colon 07/24/2017  .  Chronic systolic CHF (congestive heart failure) (HCC) 07/24/2017  . CKD (chronic kidney disease), stage III 07/24/2017  . Pleural thickening 07/24/2017  . Aortic atherosclerosis (HCC) 07/07/2017  . Abnormal nuclear cardiac imaging test 11/14/2013  . Abnormal EKG 03/09/2013  . Dilated aortic root (HCC)   . Edema 02/09/2013  . Hypertension 10/24/2011  . Coronary artery disease 10/24/2011  . Hyperlipidemia 10/24/2011  . DM (diabetes mellitus), type 2, uncontrolled with complications (HCC) 10/24/2011  . GERD (gastroesophageal reflux disease) 10/24/2011    Past Surgical History:  Procedure Laterality Date  . CORONARY ARTERY BYPASS GRAFT  10/27/2011   Procedure: CORONARY ARTERY BYPASS GRAFTING (CABG);  Surgeon: Kerin PernaPeter Van Trigt, MD;  [facility];  Service: Open Heart Surgery;  Laterality: N/A;  Coronary Artery Bypass Grafting times four using left internal mammary artery and right greater saphenous vein endoscopically harvested  . CORONARY STENT INTERVENTION N/A 09/30/2017   Procedure: CORONARY STENT INTERVENTION;  Surgeon: Corky CraftsVaranasi, Jayadeep S, MD;  [facility];  Service: Cardiovascular;  Laterality: N/A;  . KNEE ARTHROSCOPY Left 2003  . KNEE ARTHROSCOPY Right 2006  . LEFT HEART CATHETERIZATION WITH CORONARY ANGIOGRAM N/A 10/23/2011   Procedure: LEFT HEART CATHETERIZATION WITH CORONARY ANGIOGRAM;  Surgeon: Quintella Reichertraci R Turner, MD;  Location: [facility] CATH LAB;  Service: Cardiovascular;  Laterality: N/A;  . LEFT HEART CATHETERIZATION WITH CORONARY ANGIOGRAM N/A 11/16/2013   Procedure: LEFT HEART CATHETERIZATION WITH CORONARY ANGIOGRAM;  Surgeon: Micheline ChapmanMichael D Cooper, MD;  [facility];  Service: Cardiovascular;  Laterality: N/A;  . RIGHT/LEFT HEART CATH AND CORONARY/GRAFT ANGIOGRAPHY N/A 09/30/2017   Procedure: RIGHT/LEFT HEART CATH AND CORONARY/GRAFT ANGIOGRAPHY;  Surgeon: Corky CraftsVaranasi, Jayadeep S, MD;  [facility];  Service: Cardiovascular;  Laterality: N/A;        Family History  Problem Relation Age of Onset  . Hypertension Mother   . Stroke Father [id number]  . Hypertension Father   . Hypertension Paternal Grandfather   . Heart attack Neg Hx     Social History   Tobacco Use  . Smoking status: Never Smoker  . Smokeless tobacco: Never Used  Vaping Use  . Vaping Use: Never used  Substance Use Topics  . Alcohol use: Yes    Comment: 09/30/2017 "1 beer a month or less"  . Drug use: Never    Home Medications Prior to Admission medications   Medication Sig Start Date End Date Taking? Authorizing Provider  acetaminophen (TYLENOL) 500 MG tablet Take 1,000 mg by mouth every 6 (six) hours as needed for headache.   Yes [provider]  aspirin 81 MG chewable tablet Chew 81 mg by mouth daily.   Yes [provider]  atorvastatin (LIPITOR) 80 MG tablet TAKE 1 TABLET BY MOUTH ONCE DAILY *PLEASE MAKE APPOINTMENT FOR FUTURE REFILLS* Patient taking differently: Take 80 mg by mouth daily.  04/19/19  Yes Kroeger, Ovidio KinKrista M., PA-C  cholecalciferol (VITAMIN D) 1000 units tablet Take 1,000 Units by mouth daily.   Yes [provider]  clopidogrel (PLAVIX) 75 MG tablet Take 75 mg by mouth daily.   Yes [provider]  ezetimibe (ZETIA) 10 MG tablet TAKE 1 TABLET (10 MG TOTAL) BY MOUTH DAILY. PLEASE MAKE OVERDUE APPT WITH DR. Mayford Knife BEFORE ANYMORE REFILLS. Patient taking differently: Take 10 mg by mouth daily.  09/08/19  Yes Turner, Cornelious Bryant, MD  fluticasone (FLONASE) 50 MCG/ACT nasal spray Place 1 spray into both nostrils daily.   Yes [provider]  ibuprofen (ADVIL) 200 MG tablet Take 200 mg by mouth every 6 (six) hours as needed for mild pain.   Yes [provider]  insulin aspart (NOVOLOG) 100 UNIT/ML injection Inject 8 Units into the skin See admin instructions. Three times a day with meals and as needed using sliding scale.   Yes [provider]  insulin lispro protamine-lispro (HUMALOG 75/25 MIX)  (75-25) 100 UNIT/ML SUSP injection Inject 8 Units into the skin in the morning, at noon, and at bedtime.   Yes [provider]  iron polysaccharides (NIFEREX) 150 MG capsule Take 1 capsule (150 mg total) by mouth daily. 07/30/17  Yes Randel Pigg, Dorma Russell, MD  isosorbide mononitrate (IMDUR) 60 MG 24 hr tablet TAKE 1 TABLET (60 MG TOTAL) BY MOUTH DAILY. Patient taking differently: Take 60 mg by mouth daily.  09/08/19  Yes Turner, Cornelious Bryant, MD  metFORMIN (GLUCOPHAGE) 1000 MG tablet Take 1,000 mg by mouth daily with breakfast.   Yes [provider]  Multiple Vitamin (MULTIVITAMIN WITH MINERALS) TABS tablet Take 1 tablet by mouth daily.   Yes [provider]  nitroGLYCERIN (NITROSTAT) 0.4 MG SL tablet Place 1 tablet (0.4 mg total) under the tongue every 5 (five) minutes as needed for chest pain. 10/01/17 09/08/28 Yes Kroeger, Dot Lanes M., PA-C  Omega-3 1000 MG CAPS Take 2,000 mg by mouth daily.   Yes [provider]  polyethylene glycol (MIRALAX) packet Take 17 g by mouth daily. 07/30/17  Yes Randel Pigg, Dorma Russell, MD  pramipexole (MIRAPEX) 0.75 MG tablet Take 1.5 mg by mouth every evening.    Yes [provider]  Semaglutide,0.25 or 0.5MG /DOS, (OZEMPIC, 0.25 OR 0.5 MG/DOSE,) 2 MG/1.5ML SOPN Inject 1 mg into the skin every Sunday.    Yes [provider]  ticagrelor (BRILINTA) 90 MG TABS tablet Take 90 mg by mouth 2 (two) times daily.   Yes [provider]  TOUJEO SOLOSTAR 300 UNIT/ML SOPN Inject 22 Units into the skin at bedtime.  01/04/18  Yes [provider]  potassium chloride (KLOR-CON) 10 MEQ tablet TAKE 1 TABLET (10 MEQ TOTAL) BY MOUTH SEE ADMIN INSTRUCTIONS. PLEASE MAKE APT FOR FUTURE REFILLS 1ST ATTEMPT [phone]. Patient taking differently: Take 10 mEq by mouth daily.  09/08/19   Quintella Reichert, MD  torsemide (DEMADEX) 20 MG tablet TAKE 2 TABLETS BY MOUTH DAILY FOR 3 DAYS THEN DECREASE TO 1 TABLET BY MOUTH DAILY THEREAFTER Patient  not taking: Reported on 09/09/2019 09/08/19   Quintella Reichert, MD    Allergies    Lisinopril  Review of Systems   Review of Systems  Unable to perform ROS: Dementia    Physical Exam Updated Vital Signs BP (!) 141/72   Pulse 79   Temp 98.1 F (36.7 C)   Resp 20   Ht 5\' 9"  (1.753 m)   Wt (!) 110 kg   SpO2 96%   BMI 35.81  kg/m   Physical Exam Vitals and nursing note reviewed.  Constitutional:      General: He is not in acute distress.    Appearance: He is well-developed. He is not ill-appearing, toxic-appearing or diaphoretic.     Comments: Clothes are wet and soaked in urine.  HENT:     Head: Normocephalic.  Eyes:     Conjunctiva/sclera: Conjunctivae normal.  Cardiovascular:     Rate and Rhythm: Normal rate and regular rhythm.     Pulses: Normal pulses.     Heart sounds: Normal heart sounds. No murmur heard.  No friction rub. No gallop.      Comments: Peripheral pulses are 2+ and symmetric. Pulmonary:     Effort: Pulmonary effort is normal.     Comments: Crackles in the bilateral bases.  No increased work of breathing. Abdominal:     General: There is distension.     Palpations: Abdomen is soft.     Tenderness: There is no abdominal tenderness. There is no right CVA tenderness, left CVA tenderness, guarding or rebound.     Hernia: No hernia is present.     Comments: Abdomen is distended and firm, but nontender.  Musculoskeletal:        General: No tenderness.     Cervical back: Normal range of motion and neck supple.     Right lower leg: Edema present.     Left lower leg: Edema present.     Comments: Spine is nontender.  No crepitus or step-offs.  No tenderness to the paraspinal muscles.  No tenderness to the bilateral lower extremities.  Normal exam of the bilateral upper extremities.  3+ pitting edema noted to the bilateral lower extremities.  Skin:    General: Skin is warm and dry.  Neurological:     Mental Status: He is alert.     Comments: Year: 2013.  President: Jackquline Bosch Month: July.  He knows that he has been [place], [place] at [facility], which he knows is a hospital.  He is oriented to his first and last name as well as the month and day of his birthday.  Cranial nerves II through XII are grossly intact.  Moves all 4 extremities spontaneously.  GCS 14 due to confusion.  Follows simple commands.  5-5 strength against resistance of the bilateral upper and lower extremities.  Gait exam is deferred at this time.  Psychiatric:        Behavior: Behavior normal.     ED Results / Procedures / Treatments   Labs (all labs ordered are listed, but only abnormal results are displayed) Labs Reviewed  COMPREHENSIVE METABOLIC PANEL - Abnormal; Notable for the following components:      Result Value   Glucose, Bld 117 (*)    Creatinine, Ser 1.51 (*)    GFR calc non Af Amer 45 (*)    GFR calc Af Amer 52 (*)    All other components within normal limits  BRAIN NATRIURETIC PEPTIDE - Abnormal; Notable for the following components:   B Natriuretic Peptide 175.1 (*)    All other components within normal limits  SARS CORONAVIRUS 2 BY RT PCR (HOSPITAL ORDER, PERFORMED IN [facility])  CBC  ETHANOL  LACTIC ACID, PLASMA  PROTIME-INR  URINALYSIS, ROUTINE W REFLEX MICROSCOPIC  I-STAT CHEM 8, ED  SAMPLE TO BLOOD BANK    EKG EKG Interpretation  Date/Time:  Friday September 09 2019 01:40:21 EDT Ventricular Rate:  86 PR Interval:  QRS Duration: 101 QT Interval:  382 QTC Calculation: 457 R Axis:   46 Text Interpretation: Sinus rhythm Probable left atrial enlargement RSR' in V1 or V2, right VCD or RVH Borderline T abnormalities, lateral leads Minimal ST elevation, inferior leads No significant change since last tracing Confirmed by Gilda Crease [phone]) on 09/09/2019 2:00:57 AM   Radiology DG Chest 2 View  Result Date: 09/09/2019 CLINICAL DATA:  Shortness of breath EXAM: CHEST - 2 VIEW COMPARISON:  02/06/2019 FINDINGS:  Small left pleural effusion with basilar atelectasis, unchanged. Unchanged cardiomegaly status post median sternotomy. Right lung is clear. IMPRESSION: Unchanged small left pleural effusion with basilar atelectasis. Electronically Signed   By: Deatra Robinson M.D.   On: 09/09/2019 02:25   CT HEAD WO CONTRAST  Result Date: 09/09/2019 CLINICAL DATA:  Head trauma while on Plavix.  Multiple recent falls. EXAM: CT HEAD WITHOUT CONTRAST CT CERVICAL SPINE WITHOUT CONTRAST TECHNIQUE: Multidetector CT imaging of the head and cervical spine was performed following the standard protocol without intravenous contrast. Multiplanar CT image reconstructions of the cervical spine were also generated. COMPARISON:  02/06/2019 FINDINGS: CT HEAD FINDINGS Brain: There is periventricular hypoattenuation compatible with chronic microvascular disease. There are multiple old small vessel infarcts of the centrum semiovale, basal ganglia and left cerebellum. There is no acute hemorrhage. No midline shift or other mass effect. CSF spaces are normal for age. No acute cortical infarct. Vascular: No hyperdense vessel or unexpected calcification. Skull: Normal. Negative for fracture or focal lesion. Sinuses/Orbits: No acute finding. Other: None. CT CERVICAL SPINE FINDINGS Alignment: Normal. Skull base and vertebrae: No acute fracture. No primary bone lesion or focal pathologic process. Soft tissues and spinal canal: No prevertebral fluid or swelling. No visible canal hematoma. Disc levels: No bony spinal canal stenosis. Disc spaces are preserved. Upper chest: Negative. Other: None IMPRESSION: 1. No acute intracranial abnormality. 2. No acute fracture or static subluxation of the cervical spine. 3. Multiple old small vessel infarcts and findings of chronic microvascular ischemia. Electronically Signed   By: Deatra Robinson M.D.   On: 09/09/2019 02:24   CT Cervical Spine Wo Contrast  Result Date: 09/09/2019 CLINICAL DATA:  Head trauma while on  Plavix.  Multiple recent falls. EXAM: CT HEAD WITHOUT CONTRAST CT CERVICAL SPINE WITHOUT CONTRAST TECHNIQUE: Multidetector CT imaging of the head and cervical spine was performed following the standard protocol without intravenous contrast. Multiplanar CT image reconstructions of the cervical spine were also generated. COMPARISON:  02/06/2019 FINDINGS: CT HEAD FINDINGS Brain: There is periventricular hypoattenuation compatible with chronic microvascular disease. There are multiple old small vessel infarcts of the centrum semiovale, basal ganglia and left cerebellum. There is no acute hemorrhage. No midline shift or other mass effect. CSF spaces are normal for age. No acute cortical infarct. Vascular: No hyperdense vessel or unexpected calcification. Skull: Normal. Negative for fracture or focal lesion. Sinuses/Orbits: No acute finding. Other: None. CT CERVICAL SPINE FINDINGS Alignment: Normal. Skull base and vertebrae: No acute fracture. No primary bone lesion or focal pathologic process. Soft tissues and spinal canal: No prevertebral fluid or swelling. No visible canal hematoma. Disc levels: No bony spinal canal stenosis. Disc spaces are preserved. Upper chest: Negative. Other: None IMPRESSION: 1. No acute intracranial abnormality. 2. No acute fracture or static subluxation of the cervical spine. 3. Multiple old small vessel infarcts and findings of chronic microvascular ischemia. Electronically Signed   By: Deatra Robinson M.D.   On: 09/09/2019 02:24    Procedures Procedures (including critical care  time)  Medications Ordered in ED Medications  Tdap (BOOSTRIX) injection 0.5 mL (0.5 mLs Intramuscular Given 09/09/19 0308)  furosemide (LASIX) injection 40 mg (40 mg Intravenous Given 09/09/19 0427)    ED Course  I have reviewed the triage vital signs and the nursing notes.  Pertinent labs & imaging results that were available during my care of the patient were reviewed by me and considered in my medical  decision making (see chart for details).  Clinical Course as of Sep 08 433  Fri Sep 09, 2019  2774 Patient's daughter, [name], is now at bedside.  She reports that the patient has not taken his home torsemide in more than a year.  It sounds as if he is taking some of his medications, but she is uncertain as to which ones.  Over the weekend, police were called to the patient's home for a break-in.  The patient's daughter spoke with police as they were concerned about the state of array of the patient's home.  There were concerns about cleanliness as well as some of the statements that the patient was making to police.  The gave her the option of having him be evaluated by his PCP or IV seeing the patient and taking him to the ER.  Per the patient's daughter, the patient was very confused when talking to police.  She reports that recently that he was expressing to her that he was concerned about receiving threats from someone on his cell phone.  She later checked the phone and found messages that the patient was sending to an automated phone number on his phone.  States that the techs that the patient were sending were very explicit,, and not language the patient would typically use in conversation.  He has had significant personality changes over the last year since his stroke. Reports that he has been incontinent of urine for some time. He lives alone, but she lives within 5 miles of him. Both she and her son check in on him almost daily, but she is unsure of how to help him. States that he is adamant that he wants to remain in his home despite his step-wise decline over the last year after he witnessed other family members being removed from their homes into facilities as their health declined.    [MM]    Clinical Course User Index [MM] Oliviah Agostini, Coral Else, PA-C   MDM Rules/Calculators/A&P                          74 year old male with a history of cryptogenic stroke in 2020, chronic systolic and  diastolic heart failure, CKD stage III, CAD, ascending aortic aneurysm, and hypertension who presents to the emergency department by EMS from home after a fall.  The patient was seen independently evaluated by Dr. Blinda Leatherwood, attending physician, who is in agreement with work-up and plan.  Regarding his fall, he has an abrasion to the superior scalp.  No lacerations.  Will update Tdap.  CT head and cervical spine are unremarkable.  He is otherwise nontender on exam.  GCS 14 due to confusion.  He believes the year to be 2013, but he is otherwise oriented.  Vital signs are otherwise reassuring.  He has 3+ pitting edema that appears a very chronic as well as abdominal distention.  He appears volume overloaded.  Chest x-ray with small left pleural effusion and bibasilar atelectasis, which has personally been reviewed and evaluated by me.  BNP  is 175; however, patient has obese.  Weight is 118 kg.  Initially, patient reported noncompliance with his home torsemide for 3 weeks, but his daughter reported that he has been noncompliant with the medication for a year.  She is not sure about which other medications he has been regularly taking.  She lives in close proximity, but the patient lives alone.  She reports that he has had a stepwise decline over the last year, but worse since he had a cryptogenic stroke in 2020.  However, he was noted to be alert and oriented x3 during his admission in December.  There is concern about the patient's ability to live independently and care for himself at this time.  He will need to be evaluated with a transition of care consults as well as PT and OT.  Of note, patient has not been seen by neurology in the clinic since he was discharged.  I suspect his confusion today is undiagnosed dementia of the patient's daughter also reports that he has been having personality changes associated with cognitive decline.  EKG unchanged from previous.  He has a mild bump in his creatinine from  previous.  No metabolic derangements.  UA is pending.  Consult to the hospitalist team as patient's will require diuresis and further work-up and evaluation.  Dr. Toniann Fail will accept the patient for admission.  The patient appears reasonably stabilized for admission considering the current resources, flow, and capabilities available in the ED at this time, and I doubt any other [facility] requiring further screening and/or treatment in the ED prior to admission.   Final Clinical Impression(s) / ED Diagnoses Final diagnoses:  Fall, initial encounter  Altered mental status, unspecified altered mental status type  Acute on chronic combined systolic and diastolic congestive heart failure [facility])    Rx / DC Orders ED Discharge Orders    None       Barkley Boards, PA-C 09/09/19 0435    Gilda Crease, MD 09/09/19 [phone]

## 2019-09-09 NOTE — Progress Notes (Signed)
Orthopedic Tech Progress Note Patient Details:  James [name] [birth year] [id number] Level 2 Trauma  Patient ID: James [name], male   DOB: 1945-06-24, 74 y.o.   MRN: [id number]   James [name] 09/09/2019, 1:16 AM

## 2019-09-09 NOTE — ED Notes (Signed)
Patient's hearing aid placed in a denture cup. Denture cup labeled and placed in patient belongings bag.

## 2019-09-09 NOTE — ED Triage Notes (Signed)
Pt transported from home by [facility] per family pt has had several falls in last week, tonight fell forward in BR, laceration to top of head noted, Pt does take plavix. GCS 14. VSS.

## 2019-09-09 NOTE — Progress Notes (Signed)
PROGRESS NOTE  James [name] James [name] ([place]) WNI:[id number] DOB: [birth year] DOA: 09/09/2019 PCP: Koren Shiver, DO  HPI/Recap of past 24 hours: HPI from Dr Allyn Kenner is a 74 y.o. male with history of CAD status post CABG, and cryptogenic stroke in December 2020, CHF last EF measured in December 2020 was 40 to 45%, hypertension, chronic kidney disease stage III, diabetes mellitus type 2 presents to the ER after patient had a fall. Patient states there was some water leaking from the roof and he slipped on it and fell onto the floor hitting his head. Did not lose consciousness. Patient also has stopped taking his torsemide because he has been urinating frequently and he was finding it difficult to walk and he has stopped taking about 3 months ago. Denies chest pain or shortness of breath. Recently followed up with primary care physician and since then he states he has not been eating well and has lost some weight. In the ER CT head and C-spine was unremarkable labs show creatinine 1.5 BNP of 175 CBC was unremarkable chest x-ray shows no pleural effusion. Patient on exam has mild elevated JVD and bilateral lower extremity edema with venous stasis dermatitis. Patient admitted for fluid overload. Covid test was negative. EKG shows normal sinus rhythm.     Today, patient denied any new complaints, denied any worsening shortness of breath, chest pain, abdominal pain, nausea/vomiting, fever/chills.   Assessment/Plan: Principal Problem:   Acute CHF (congestive heart failure) (HCC) Active Problems:   DM (diabetes mellitus), type 2, uncontrolled with complications (HCC)   CKD (chronic kidney disease), stage III   S/P CABG (coronary artery bypass graft)   Cryptogenic stroke (HCC)   CHF (congestive heart failure) (HCC)   Possible acute on chronic systolic HF Noted to have bilateral lower extremity edema BNP 175 Chest x-ray with small pleural effusion Echo done on 01/2019 showed EF of 40 to  45% Continue IV Lasix Strict I's and O's, daily weight Telemetry  CKD stage IIIa Creatinine at baseline, 1.3-1.5 Daily BMP  Hypertension Uncontrolled, poor medication compliance Continue hydralazine, Imdur, Toprol  CAD status post CABG Currently chest pain-free Continue statins, Imdur, Toprol  History of CVA Continue statins, aspirin, Plavix  Diabetes mellitus type 2 Last A1c 12.9 on 01/2019, uncontrolled, will repeat A1c Continue SSI, NovoLog 70/30, Toujeo  Recurrent falls No focal neurologic deficit noted CT head/CT spine unremarkable Multifactorial PT/OT  Bilateral venous stasis dermatitis Appears chronic Wound care consulted  Obesity Lifestyle modification advised      Malnutrition Type:      Malnutrition Characteristics:      Nutrition Interventions:       Estimated body mass index is 35.81 kg/m as calculated from the following:   Height as of this encounter: 5\' 9"  (1.753 m).   Weight as of this encounter: 110 kg.     Code Status: DNR  Family Communication: Discussed with patient extensively  Disposition Plan: Status is: Inpatient  Remains inpatient appropriate because:Inpatient level of care appropriate due to severity of illness   Dispo: The patient is from: Home              Anticipated d/c is to: Home              Anticipated d/c date is: 2 days              Patient currently is medically stable to d/c.   Consultants:  None  Procedures:  None  Antimicrobials:  None  DVT prophylaxis: Lovenox   Objective: Vitals:   09/09/19 1338 09/09/19 1346 09/09/19 1356 09/09/19 1428  BP:  112/65  123/68  Pulse: 68 74 72 72  Resp: 13 (!) 25 18 18   Temp:      SpO2: 98%  93% 93%  Weight:      Height:        Intake/Output Summary (Last 24 hours) at 09/09/2019 1515 Last data filed at 09/09/2019 1117 Gross per 24 hour  Intake --  Output 1100 ml  Net -1100 ml   Filed Weights   09/09/19 0113 09/09/19 0431  Weight: (!)  118 kg (!) 110 kg    Exam:  General: NAD   Cardiovascular: S1, S2 present  Respiratory: CTAB  Abdomen: Soft, nontender, nondistended, bowel sounds present  Musculoskeletal: 2+ bilateral pedal edema noted  Skin:  Bilateral chronic venous stasis dermatitis  Psychiatry: Normal mood    Data Reviewed: CBC: Recent Labs  Lab 09/09/19 0126 09/09/19 0728  WBC 8.1 6.8  NEUTROABS  --  4.6  HGB 14.9 14.8  HCT 45.3 45.5  MCV 89.5 88.0  PLT 222 209   Basic Metabolic Panel: Recent Labs  Lab 09/09/19 0126 09/09/19 0728  NA 138 137  K 3.7 3.6  CL 102 103  CO2 25 24  GLUCOSE 117* 118*  BUN 20 19  CREATININE 1.51* 1.48*  CALCIUM 9.2 9.1  MG  --  2.0   GFR: Estimated Creatinine Clearance: 53.5 mL/min (A) (by C-G formula based on SCr of 1.48 mg/dL (H)). Liver Function Tests: Recent Labs  Lab 09/09/19 0126 09/09/19 0728  AST 21 18  ALT 16 16  ALKPHOS 72 67  BILITOT 0.9 1.1  PROT 7.0 6.9  ALBUMIN 3.9 3.7   No results for input(s): LIPASE, AMYLASE in the last 168 hours. No results for input(s): AMMONIA in the last 168 hours. Coagulation Profile: Recent Labs  Lab 09/09/19 0126  INR 1.0   Cardiac Enzymes: No results for input(s): CKTOTAL, CKMB, CKMBINDEX, TROPONINI in the last 168 hours. BNP (last 3 results) No results for input(s): PROBNP in the last 8760 hours. HbA1C: No results for input(s): HGBA1C in the last 72 hours. CBG: Recent Labs  Lab 09/09/19 0752 09/09/19 0828 09/09/19 1154  GLUCAP 124* 121* 201*   Lipid Profile: No results for input(s): CHOL, HDL, LDLCALC, TRIG, CHOLHDL, LDLDIRECT in the last 72 hours. Thyroid Function Tests: Recent Labs    09/09/19 0731  TSH 1.237   Anemia Panel: No results for input(s): VITAMINB12, FOLATE, FERRITIN, TIBC, IRON, RETICCTPCT in the last 72 hours. Urine analysis:    Component Value Date/Time   COLORURINE YELLOW 09/09/2019 0628   APPEARANCEUR CLEAR 09/09/2019 0628   LABSPEC 1.010 09/09/2019 0628    PHURINE 6.0 09/09/2019 0628   GLUCOSEU NEGATIVE 09/09/2019 0628   HGBUR NEGATIVE 09/09/2019 0628   BILIRUBINUR NEGATIVE 09/09/2019 0628   KETONESUR NEGATIVE 09/09/2019 0628   PROTEINUR 100 (A) 09/09/2019 0628   UROBILINOGEN 1.0 10/24/2011 1742   NITRITE NEGATIVE 09/09/2019 0628   LEUKOCYTESUR NEGATIVE 09/09/2019 0628   Sepsis Labs: @LABRCNTIP (procalcitonin:4,lacticidven:4)  ) Recent Results (from the past 240 hour(s))  SARS Coronavirus 2 by RT PCR (hospital order, performed in [name] lab) Nasopharyngeal Nasopharyngeal Swab     Status: None   Collection Time: 09/09/19  3:15 AM   Specimen: Nasopharyngeal Swab  Result Value Ref Range Status   SARS Coronavirus 2 NEGATIVE NEGATIVE Final    Comment: (NOTE) SARS-CoV-2 target nucleic acids are NOT  DETECTED.  The SARS-CoV-2 RNA is generally detectable in upper and lower respiratory specimens during the acute phase of infection. The lowest concentration of SARS-CoV-2 viral copies this assay can detect is 250 copies / mL. A negative result does not preclude SARS-CoV-2 infection and should not be used as the sole basis for treatment or other patient management decisions.  A negative result may occur with improper specimen collection / handling, submission of specimen other than nasopharyngeal swab, presence of viral mutation(s) within the areas targeted by this assay, and inadequate number of viral copies (<250 copies / mL). A negative result must be combined with clinical observations, patient history, and epidemiological information.  Fact Sheet for Patients:   BoilerBrush.com.cyhttps://www.fda.gov/media/136312/download  Fact Sheet for Healthcare Providers: https://pope.com/https://www.fda.gov/media/136313/download  This test is not yet approved or  cleared by the [place] FDA and has been authorized for detection and/or diagnosis of SARS-CoV-2 by FDA under an Emergency Use Authorization (EUA).  This EUA will remain in effect (meaning this test can  be used) for the duration of the COVID-19 declaration under Section 564(b)(1) of the Act, 21 U.S.C. section 360bbb-3(b)(1), unless the authorization is terminated or revoked sooner.  Performed at [facility], [address] [place], [place] [address]       Studies: DG Chest 2 View  Result Date: 09/09/2019 CLINICAL DATA:  Shortness of breath EXAM: CHEST - 2 VIEW COMPARISON:  02/06/2019 FINDINGS: Small left pleural effusion with basilar atelectasis, unchanged. Unchanged cardiomegaly status post median sternotomy. Right lung is clear. IMPRESSION: Unchanged small left pleural effusion with basilar atelectasis. Electronically Signed   By: Deatra RobinsonKevin  Herman M.D.   On: 09/09/2019 02:25   CT HEAD WO CONTRAST  Result Date: 09/09/2019 CLINICAL DATA:  Head trauma while on Plavix.  Multiple recent falls. EXAM: CT HEAD WITHOUT CONTRAST CT CERVICAL SPINE WITHOUT CONTRAST TECHNIQUE: Multidetector CT imaging of the head and cervical spine was performed following the standard protocol without intravenous contrast. Multiplanar CT image reconstructions of the cervical spine were also generated. COMPARISON:  02/06/2019 FINDINGS: CT HEAD FINDINGS Brain: There is periventricular hypoattenuation compatible with chronic microvascular disease. There are multiple old small vessel infarcts of the centrum semiovale, basal ganglia and left cerebellum. There is no acute hemorrhage. No midline shift or other mass effect. CSF spaces are normal for age. No acute cortical infarct. Vascular: No hyperdense vessel or unexpected calcification. Skull: Normal. Negative for fracture or focal lesion. Sinuses/Orbits: No acute finding. Other: None. CT CERVICAL SPINE FINDINGS Alignment: Normal. Skull base and vertebrae: No acute fracture. No primary bone lesion or focal pathologic process. Soft tissues and spinal canal: No prevertebral fluid or swelling. No visible canal hematoma. Disc levels: No bony spinal canal stenosis. Disc spaces  are preserved. Upper chest: Negative. Other: None IMPRESSION: 1. No acute intracranial abnormality. 2. No acute fracture or static subluxation of the cervical spine. 3. Multiple old small vessel infarcts and findings of chronic microvascular ischemia. Electronically Signed   By: Deatra RobinsonKevin  Herman M.D.   On: 09/09/2019 02:24   CT Cervical Spine Wo Contrast  Result Date: 09/09/2019 CLINICAL DATA:  Head trauma while on Plavix.  Multiple recent falls. EXAM: CT HEAD WITHOUT CONTRAST CT CERVICAL SPINE WITHOUT CONTRAST TECHNIQUE: Multidetector CT imaging of the head and cervical spine was performed following the standard protocol without intravenous contrast. Multiplanar CT image reconstructions of the cervical spine were also generated. COMPARISON:  02/06/2019 FINDINGS: CT HEAD FINDINGS Brain: There is periventricular hypoattenuation compatible with chronic microvascular disease. There  are multiple old small vessel infarcts of the centrum semiovale, basal ganglia and left cerebellum. There is no acute hemorrhage. No midline shift or other mass effect. CSF spaces are normal for age. No acute cortical infarct. Vascular: No hyperdense vessel or unexpected calcification. Skull: Normal. Negative for fracture or focal lesion. Sinuses/Orbits: No acute finding. Other: None. CT CERVICAL SPINE FINDINGS Alignment: Normal. Skull base and vertebrae: No acute fracture. No primary bone lesion or focal pathologic process. Soft tissues and spinal canal: No prevertebral fluid or swelling. No visible canal hematoma. Disc levels: No bony spinal canal stenosis. Disc spaces are preserved. Upper chest: Negative. Other: None IMPRESSION: 1. No acute intracranial abnormality. 2. No acute fracture or static subluxation of the cervical spine. 3. Multiple old small vessel infarcts and findings of chronic microvascular ischemia. Electronically Signed   By: Deatra Robinson M.D.   On: 09/09/2019 02:24    Scheduled Meds: . aspirin  81 mg Oral Daily  .  atorvastatin  80 mg Oral Daily  . clopidogrel  75 mg Oral Daily  . enoxaparin (LOVENOX) injection  40 mg Subcutaneous Q24H  . ezetimibe  10 mg Oral Daily  . furosemide  20 mg Intravenous Q12H  . hydrALAZINE  25 mg Oral Q8H  . insulin aspart  0-9 Units Subcutaneous TID WC  . insulin aspart protamine- aspart  8 Units Subcutaneous BID WC  . insulin glargine (1 Unit Dial)  22 Units Subcutaneous QHS  . iron polysaccharides  150 mg Oral Daily  . isosorbide mononitrate  60 mg Oral Daily  . metoprolol succinate  50 mg Oral Daily  . omega-3 acid ethyl esters  2,000 mg Oral Daily  . polyethylene glycol  17 g Oral Daily  . potassium chloride  10 mEq Oral Daily  . pramipexole  1.5 mg Oral QPM    Continuous Infusions:   LOS: 0 days     Briant Cedar, MD [facility]  If 7PM-7AM, please contact night-coverage [email or web address] 09/09/2019, 3:15 PM

## 2019-09-09 NOTE — H&P (Signed)
History and Physical    James [name]:[id number] DOB: March 17, 1945 DOA: 09/09/2019  PCP: James Shiver, DO  Patient coming from: Home.  Chief Complaint: Fall.  HPI: James [name] is a 74 y.o. male with history of CAD status post CABG, and cryptogenic stroke in December 2020, CHF last EF measured in December 2020 was 40 to 45%, hypertension, chronic kidney disease stage III, diabetes mellitus type 2 presents to the ER after patient had a fall. Patient states there was some water leaking from the roof and he slipped on it and fell onto the floor hitting his head. Did not lose consciousness. Patient also has stopped taking his torsemide because he has been urinating frequently and he was finding it difficult to walk and he has stopped taking about 3 months ago. Denies chest pain or shortness of breath. Recently followed up with primary care physician and since then he states he has not been eating well and has lost some weight.  ED Course: In the ER CT head and C-spine was unremarkable labs show creatinine 1.5 BNP of 175 CBC was unremarkable chest x-ray shows no pleural effusion. Patient on exam has mild elevated JVD and bilateral lower extremity edema with venous stasis dermatitis. Patient admitted for fluid overload. Covid test was negative. EKG shows normal sinus rhythm.  Review of Systems: As per HPI, rest all negative.   Past Medical History:  Diagnosis Date  . Abnormal EKG 03/09/2013  . Abnormal nuclear cardiac imaging test 11/14/2013  . Acute lacunar infarction (HCC) 02/06/2019  . Aortic atherosclerosis (HCC) 07/07/2017  . Arthritis    "all over" (09/30/2017)  . Ascending aortic aneurysm (HCC)    cMRI (2/15): Normal LV EF 54%, Mild BAE, Trileaflet Aortic Valve, Upper limits of normal ascending aorta 3.8 cm, Normal aortic arch 2.3 cm with bovine origin of left carotid  . Atrial flutter (HCC)    Post-op with no reoccurence  . Chronic diastolic heart failure (HCC)    Echo (1/15):  Left ventricle: The cavity size was mildly dilated. Mild LVH. EF 55% to 60%. Wall motion was normal; Gr 1 diastolic dysfunction Left atrium: The atrium was mildly dilated. Right atrium: The atrium was mildly to moderately dilated. Aortic arch measures 4.8 cm (moderately dilated); suggest CTA or MRA to better assess.  . Chronic systolic CHF (congestive heart failure) (HCC) 07/24/2017  . CKD (chronic kidney disease), stage III 07/24/2017  . Coronary artery disease 10/2011   a. s/p cabg;  b. Myoview (9/15):  ant ischemia, poss TID, EF 43%, high risk >> LHC (9/15): Dist LM 50, pLAD 90 then 100, pCFX 50, mCFX 75, oRCA 75, dRCA 95, L-LAD patent, S-Dx 100, S-OM1 patent, S-PDA patent, EF 55% >> native Dx small caliber and not well suited for PCI; native RCA amenable to PCI and would restore flow to PLA branches but would jeopardize SVG-RCA - Med Rx  . Deviated nasal septum   . Dilated aortic root (HCC)   . Diverticulitis of sigmoid colon 07/24/2017  . DM (diabetes mellitus), type 2, uncontrolled with complications (HCC)    onset 2013  . Dyslipidemia   . Edema 02/09/2013  . GERD (gastroesophageal reflux disease)   . Hx of Doppler ultrasound    Carotid US (9/13): No ICA stenosis  . Hypertension   . Pleural thickening 07/24/2017    Past Surgical History:  Procedure Laterality Date  . CORONARY ARTERY BYPASS GRAFT  10/27/2011   Procedure: CORONARY ARTERY BYPASS GRAFTING (CABG);  Surgeon: Kerin Perna, MD;  Location[facility];  Service: Open Heart Surgery;  Laterality: N/A;  Coronary Artery Bypass Grafting times four using left internal mammary artery and right greater saphenous vein endoscopically harvested  . CORONARY STENT INTERVENTION N/A 09/30/2017   Procedure: CORONARY STENT INTERVENTION;  Surgeon: Corky Crafts, MD;  Location: [facility] INVASIVE CV LAB;  Service: Cardiovascular;  Laterality: N/A;  . KNEE ARTHROSCOPY Left 2003  . KNEE ARTHROSCOPY Right 2006  . LEFT HEART CATHETERIZATION WITH CORONARY  ANGIOGRAM N/A 10/23/2011   Procedure: LEFT HEART CATHETERIZATION WITH CORONARY ANGIOGRAM;  Surgeon: Quintella Reichert, MD;  Location: [facility] CATH LAB;  Service: Cardiovascular;  Laterality: N/A;  . LEFT HEART CATHETERIZATION WITH CORONARY ANGIOGRAM N/A 11/16/2013   Procedure: LEFT HEART CATHETERIZATION WITH CORONARY ANGIOGRAM;  Surgeon: Micheline Chapman, MD;  Location: [facility] CATH LAB;  Service: Cardiovascular;  Laterality: N/A;  . RIGHT/LEFT HEART CATH AND CORONARY/GRAFT ANGIOGRAPHY N/A 09/30/2017   Procedure: RIGHT/LEFT HEART CATH AND CORONARY/GRAFT ANGIOGRAPHY;  Surgeon: Corky Crafts, MD;  Location: [facility] INVASIVE CV LAB;  Service: Cardiovascular;  Laterality: N/A;     reports that he has never smoked. He has never used smokeless tobacco. He reports current alcohol use. He reports that he does not use drugs.  Allergies  Allergen Reactions  . Lisinopril Cough    Family History  Problem Relation Age of Onset  . Hypertension Mother   . Stroke Father 32  . Hypertension Father   . Hypertension Paternal Grandfather   . Heart attack Neg Hx     Prior to Admission medications   Medication Sig Start Date End Date Taking? Authorizing Provider  acetaminophen (TYLENOL) 500 MG tablet Take 1,000 mg by mouth every 6 (six) hours as needed for headache.   Yes [provider]  aspirin 81 MG chewable tablet Chew 81 mg by mouth daily.   Yes [provider]  atorvastatin (LIPITOR) 80 MG tablet TAKE 1 TABLET BY MOUTH ONCE DAILY *PLEASE MAKE APPOINTMENT FOR FUTURE REFILLS* Patient taking differently: Take 80 mg by mouth daily.  04/19/19  Yes Kroeger, Ovidio Kin., PA-C  cholecalciferol (VITAMIN D) 1000 units tablet Take 1,000 Units by mouth daily.   Yes [provider]  clopidogrel (PLAVIX) 75 MG tablet Take 75 mg by mouth daily.   Yes [provider]  ezetimibe (ZETIA) 10 MG tablet TAKE 1 TABLET (10 MG TOTAL) BY MOUTH DAILY. PLEASE MAKE OVERDUE APPT WITH DR. Mayford Knife BEFORE ANYMORE  REFILLS. Patient taking differently: Take 10 mg by mouth daily.  09/08/19  Yes Turner, Cornelious Bryant, MD  fluticasone (FLONASE) 50 MCG/ACT nasal spray Place 1 spray into both nostrils daily.   Yes [provider]  ibuprofen (ADVIL) 200 MG tablet Take 200 mg by mouth every 6 (six) hours as needed for mild pain.   Yes [provider]  insulin aspart (NOVOLOG) 100 UNIT/ML injection Inject 8 Units into the skin See admin instructions. Three times a day with meals and as needed using sliding scale.   Yes [provider]  insulin lispro protamine-lispro (HUMALOG 75/25 MIX) (75-25) 100 UNIT/ML SUSP injection Inject 8 Units into the skin in the morning, at noon, and at bedtime.   Yes [provider]  iron polysaccharides (NIFEREX) 150 MG capsule Take 1 capsule (150 mg total) by mouth daily. 07/30/17  Yes Randel Pigg, Dorma Russell, MD  isosorbide mononitrate (IMDUR) 60 MG 24 hr tablet TAKE 1 TABLET (60 MG TOTAL) BY MOUTH DAILY. Patient taking differently: Take  60 mg by mouth daily.  09/08/19  Yes Turner, Cornelious Bryant, MD  metFORMIN (GLUCOPHAGE) 1000 MG tablet Take 1,000 mg by mouth daily with breakfast.   Yes [provider]  Multiple Vitamin (MULTIVITAMIN WITH MINERALS) TABS tablet Take 1 tablet by mouth daily.   Yes [provider]  nitroGLYCERIN (NITROSTAT) 0.4 MG SL tablet Place 1 tablet (0.4 mg total) under the tongue every 5 (five) minutes as needed for chest pain. 10/01/17 09/08/28 Yes Kroeger, Dot Lanes M., PA-C  Omega-3 1000 MG CAPS Take 2,000 mg by mouth daily.   Yes [provider]  polyethylene glycol (MIRALAX) packet Take 17 g by mouth daily. 07/30/17  Yes Randel Pigg, Dorma Russell, MD  pramipexole (MIRAPEX) 0.75 MG tablet Take 1.5 mg by mouth every evening.    Yes [provider]  Semaglutide,0.25 or 0.5MG /DOS, (OZEMPIC, 0.25 OR 0.5 MG/DOSE,) 2 MG/1.5ML SOPN Inject 1 mg into the skin every Sunday.    Yes [provider]  ticagrelor (BRILINTA) 90  MG TABS tablet Take 90 mg by mouth 2 (two) times daily.   Yes [provider]  TOUJEO SOLOSTAR 300 UNIT/ML SOPN Inject 22 Units into the skin at bedtime.  01/04/18  Yes [provider]  potassium chloride (KLOR-CON) 10 MEQ tablet TAKE 1 TABLET (10 MEQ TOTAL) BY MOUTH SEE ADMIN INSTRUCTIONS. PLEASE MAKE APT FOR FUTURE REFILLS 1ST ATTEMPT [phone]. Patient taking differently: Take 10 mEq by mouth daily.  09/08/19   Quintella Reichert, MD  torsemide (DEMADEX) 20 MG tablet TAKE 2 TABLETS BY MOUTH DAILY FOR 3 DAYS THEN DECREASE TO 1 TABLET BY MOUTH DAILY THEREAFTER Patient not taking: Reported on 09/09/2019 09/08/19   Quintella Reichert, MD    Physical Exam: Constitutional: Moderately built and nourished. Vitals:   09/09/19 0558 09/09/19 0613 09/09/19 0628 09/09/19 0643  BP: 123/77 (!) 125/63 (!) 194/91 (!) 186/92  Pulse: 71 60 72 68  Resp: 21 20 (!) 24 21  Temp:      SpO2: 96% 97% 97% 97%  Weight:      Height:       Eyes: Anicteric no pallor. ENMT: No discharge from the ears eyes nose or mouth. Neck: No mass felt. No neck rigidity. JVD elevated. Respiratory: No rhonchi or crepitations. Cardiovascular: S1-S2 heard. Abdomen: Soft nontender bowel sounds present. Musculoskeletal: Bilateral lower extremity edema extending up to the knee. Skin: Chronic skin changes in the lower extremities. Neurologic: Alert awake oriented to time place and person. Moves all extremities. Psychiatric: Appears normal. Normal affect.   Labs on Admission: I have personally reviewed following labs and imaging studies  CBC: Recent Labs  Lab 09/09/19 0126  WBC 8.1  HGB 14.9  HCT 45.3  MCV 89.5  PLT 222   Basic Metabolic Panel: Recent Labs  Lab 09/09/19 0126  NA 138  K 3.7  CL 102  CO2 25  GLUCOSE 117*  BUN 20  CREATININE 1.51*  CALCIUM 9.2   GFR: Estimated Creatinine Clearance: 52.5 mL/min (A) (by C-G formula based on SCr of 1.51 mg/dL (H)). Liver Function Tests: Recent Labs    Lab 09/09/19 0126  AST 21  ALT 16  ALKPHOS 72  BILITOT 0.9  PROT 7.0  ALBUMIN 3.9   No results for input(s): LIPASE, AMYLASE in the last 168 hours. No results for input(s): AMMONIA in the last 168 hours. Coagulation Profile: Recent Labs  Lab 09/09/19 0126  INR 1.0   Cardiac Enzymes: No results for input(s): CKTOTAL, CKMB, CKMBINDEX,  TROPONINI in the last 168 hours. BNP (last 3 results) No results for input(s): PROBNP in the last 8760 hours. HbA1C: No results for input(s): HGBA1C in the last 72 hours. CBG: No results for input(s): GLUCAP in the last 168 hours. Lipid Profile: No results for input(s): CHOL, HDL, LDLCALC, TRIG, CHOLHDL, LDLDIRECT in the last 72 hours. Thyroid Function Tests: No results for input(s): TSH, T4TOTAL, FREET4, T3FREE, THYROIDAB in the last 72 hours. Anemia Panel: No results for input(s): VITAMINB12, FOLATE, FERRITIN, TIBC, IRON, RETICCTPCT in the last 72 hours. Urine analysis:    Component Value Date/Time   COLORURINE STRAW (A) 02/06/2019 0007   APPEARANCEUR CLEAR 02/06/2019 0007   LABSPEC 1.013 02/06/2019 0007   PHURINE 6.0 02/06/2019 0007   GLUCOSEU >=500 (A) 02/06/2019 0007   HGBUR SMALL (A) 02/06/2019 0007   BILIRUBINUR NEGATIVE 02/06/2019 0007   KETONESUR NEGATIVE 02/06/2019 0007   PROTEINUR 100 (A) 02/06/2019 0007   UROBILINOGEN 1.0 10/24/2011 1742   NITRITE NEGATIVE 02/06/2019 0007   LEUKOCYTESUR NEGATIVE 02/06/2019 0007   Sepsis Labs: (procalcitonin:4,lacticidven:4) ) Recent Results (from the past 240 hour(s))  SARS Coronavirus 2 by RT PCR (hospital order, performed in [facility] hospital lab) Nasopharyngeal Nasopharyngeal Swab     Status: None   Collection Time: 09/09/19  3:15 AM   Specimen: Nasopharyngeal Swab  Result Value Ref Range Status   SARS Coronavirus 2 NEGATIVE NEGATIVE Final    Comment: (NOTE) SARS-CoV-2 target nucleic acids are NOT DETECTED.  The SARS-CoV-2 RNA is generally detectable in upper and  lower respiratory specimens during the acute phase of infection. The lowest concentration of SARS-CoV-2 viral copies this assay can detect is 250 copies / mL. A negative result does not preclude SARS-CoV-2 infection and should not be used as the sole basis for treatment or other patient management decisions.  A negative result may occur with improper specimen collection / handling, submission of specimen other than nasopharyngeal swab, presence of viral mutation(s) within the areas targeted by this assay, and inadequate number of viral copies (<250 copies / mL). A negative result must be combined with clinical observations, patient history, and epidemiological information.  Fact Sheet for Patients:   BoilerBrush.com.cy  Fact Sheet for Healthcare Providers: https://pope.com/  This test is not yet approved or  cleared by the [place] FDA and has been authorized for detection and/or diagnosis of SARS-CoV-2 by FDA under an Emergency Use Authorization (EUA).  This EUA will remain in effect (meaning this test can be used) for the duration of the COVID-19 declaration under Section 564(b)(1) of the Act, 21 U.S.C. section 360bbb-3(b)(1), unless the authorization is terminated or revoked sooner.  Performed at [facility], [address] [place], [place] [address]      Radiological Exams on Admission: DG Chest 2 View  Result Date: 09/09/2019 CLINICAL DATA:  Shortness of breath EXAM: CHEST - 2 VIEW COMPARISON:  02/06/2019 FINDINGS: Small left pleural effusion with basilar atelectasis, unchanged. Unchanged cardiomegaly status post median sternotomy. Right lung is clear. IMPRESSION: Unchanged small left pleural effusion with basilar atelectasis. Electronically Signed   By: Deatra Robinson M.D.   On: 09/09/2019 02:25   CT HEAD WO CONTRAST  Result Date: 09/09/2019 CLINICAL DATA:  Head trauma while on Plavix.  Multiple recent falls. EXAM:  CT HEAD WITHOUT CONTRAST CT CERVICAL SPINE WITHOUT CONTRAST TECHNIQUE: Multidetector CT imaging of the head and cervical spine was performed following the standard protocol without intravenous contrast. Multiplanar CT image reconstructions of the cervical spine  were also generated. COMPARISON:  02/06/2019 FINDINGS: CT HEAD FINDINGS Brain: There is periventricular hypoattenuation compatible with chronic microvascular disease. There are multiple old small vessel infarcts of the centrum semiovale, basal ganglia and left cerebellum. There is no acute hemorrhage. No midline shift or other mass effect. CSF spaces are normal for age. No acute cortical infarct. Vascular: No hyperdense vessel or unexpected calcification. Skull: Normal. Negative for fracture or focal lesion. Sinuses/Orbits: No acute finding. Other: None. CT CERVICAL SPINE FINDINGS Alignment: Normal. Skull base and vertebrae: No acute fracture. No primary bone lesion or focal pathologic process. Soft tissues and spinal canal: No prevertebral fluid or swelling. No visible canal hematoma. Disc levels: No bony spinal canal stenosis. Disc spaces are preserved. Upper chest: Negative. Other: None IMPRESSION: 1. No acute intracranial abnormality. 2. No acute fracture or static subluxation of the cervical spine. 3. Multiple old small vessel infarcts and findings of chronic microvascular ischemia. Electronically Signed   By: Deatra Robinson M.D.   On: 09/09/2019 02:24   CT Cervical Spine Wo Contrast  Result Date: 09/09/2019 CLINICAL DATA:  Head trauma while on Plavix.  Multiple recent falls. EXAM: CT HEAD WITHOUT CONTRAST CT CERVICAL SPINE WITHOUT CONTRAST TECHNIQUE: Multidetector CT imaging of the head and cervical spine was performed following the standard protocol without intravenous contrast. Multiplanar CT image reconstructions of the cervical spine were also generated. COMPARISON:  02/06/2019 FINDINGS: CT HEAD FINDINGS Brain: There is periventricular  hypoattenuation compatible with chronic microvascular disease. There are multiple old small vessel infarcts of the centrum semiovale, basal ganglia and left cerebellum. There is no acute hemorrhage. No midline shift or other mass effect. CSF spaces are normal for age. No acute cortical infarct. Vascular: No hyperdense vessel or unexpected calcification. Skull: Normal. Negative for fracture or focal lesion. Sinuses/Orbits: No acute finding. Other: None. CT CERVICAL SPINE FINDINGS Alignment: Normal. Skull base and vertebrae: No acute fracture. No primary bone lesion or focal pathologic process. Soft tissues and spinal canal: No prevertebral fluid or swelling. No visible canal hematoma. Disc levels: No bony spinal canal stenosis. Disc spaces are preserved. Upper chest: Negative. Other: None IMPRESSION: 1. No acute intracranial abnormality. 2. No acute fracture or static subluxation of the cervical spine. 3. Multiple old small vessel infarcts and findings of chronic microvascular ischemia. Electronically Signed   By: Deatra Robinson M.D.   On: 09/09/2019 02:24    EKG: Independently reviewed. Normal sinus rhythm.  Assessment/Plan Principal Problem:   Acute CHF (congestive heart failure) (HCC) Active Problems:   DM (diabetes mellitus), type 2, uncontrolled with complications (HCC)   CKD (chronic kidney disease), stage III   S/P CABG (coronary artery bypass graft)   Cryptogenic stroke (HCC)   CHF (congestive heart failure) (HCC)    1. Acute on chronic systolic heart failure last EF measured in December 2020 was 40 to 45%. Patient stopped taking torsemide by himself 3 months ago because he was having frequent urination. At the same time found it difficult to walk. Patient was given 40 mg IV Lasix in the ER we will continue with 20 mg IV every 12. Not on ACE inhibitor due to renal failure. 2. Uncontrolled hypertension presently on IV Lasix and will continue Imdur. Follow blood pressure trends. As needed IV  hydralazine. 3. CAD status post CABG denies any chest pain. Antiplatelet agents and Imdur and statins. 4. History of stroke on statins and antiplatelet agents. 5. Diabetes mellitus type 2 on Toujeo and NovoLog/7030. On sliding scale coverage. 6. Chronic renal  disease stage III creatinine appears to be at baseline. 7. Venous stasis dermatitis appears to be chronic.  Given that patient has CHF with bilateral lower extremity edema and uncontrolled hypertension will need close monitoring for any further worsening in inpatient status.   DVT prophylaxis: Lovenox. Code Status: DNR. Family Communication: Discussed with patient. Disposition Plan: To be determined. Consults called: Physical therapy and social work. Admission status: Inpatient.   Eduard ClosArshad N Jasraj Lappe MD [facility] Pager [phone].  If 7PM-7AM, please contact night-coverage [email or web address] Password [facility] [email or web address]  09/09/2019, 7:09 AM

## 2019-09-10 DIAGNOSIS — N1831 Chronic kidney disease, stage 3a: Secondary | ICD-10-CM

## 2019-09-10 DIAGNOSIS — I5031 Acute diastolic (congestive) heart failure: Secondary | ICD-10-CM

## 2019-09-10 DIAGNOSIS — Z951 Presence of aortocoronary bypass graft: Secondary | ICD-10-CM

## 2019-09-10 LAB — GLUCOSE, CAPILLARY
Glucose-Capillary: 82 mg/dL (ref 70–99)
Glucose-Capillary: 127 mg/dL — ABNORMAL HIGH (ref 70–99)
Glucose-Capillary: 139 mg/dL — ABNORMAL HIGH (ref 70–99)
Glucose-Capillary: 112 mg/dL — ABNORMAL HIGH (ref 70–99)
Glucose-Capillary: 143 mg/dL — ABNORMAL HIGH (ref 70–99)

## 2019-09-10 LAB — CBC WITH DIFFERENTIAL/PLATELET
Abs Immature Granulocytes: 0.05 10*3/uL (ref 0.00–0.07)
Basophils Absolute: 0 10*3/uL (ref 0.0–0.1)
Basophils Relative: 0 %
Eosinophils Absolute: 0.4 10*3/uL (ref 0.0–0.5)
Eosinophils Relative: 4 %
HCT: 42.5 % (ref 39.0–52.0)
Hemoglobin: 14.2 g/dL (ref 13.0–17.0)
Immature Granulocytes: 1 %
Lymphocytes Relative: 11 %
Lymphs Abs: 1.1 10*3/uL (ref 0.7–4.0)
MCH: 29 pg (ref 26.0–34.0)
MCHC: 33.4 g/dL (ref 30.0–36.0)
MCV: 86.9 fL (ref 80.0–100.0)
Monocytes Absolute: 1.1 10*3/uL — ABNORMAL HIGH (ref 0.1–1.0)
Monocytes Relative: 12 %
Neutro Abs: 7.2 10*3/uL (ref 1.7–7.7)
Neutrophils Relative %: 72 %
Platelets: 213 10*3/uL (ref 150–400)
RBC: 4.89 MIL/uL (ref 4.22–5.81)
RDW: 13.1 % (ref 11.5–15.5)
WBC: 9.9 10*3/uL (ref 4.0–10.5)
nRBC: 0 % (ref 0.0–0.2)

## 2019-09-10 LAB — BASIC METABOLIC PANEL
Anion gap: 12 (ref 5–15)
BUN: 20 mg/dL (ref 8–23)
CO2: 25 mmol/L (ref 22–32)
Calcium: 9 mg/dL (ref 8.9–10.3)
Chloride: 102 mmol/L (ref 98–111)
Creatinine, Ser: 1.69 mg/dL — ABNORMAL HIGH (ref 0.61–1.24)
GFR calc Af Amer: 45 mL/min — ABNORMAL LOW (ref >60)
GFR calc non Af Amer: 39 mL/min — ABNORMAL LOW (ref >60)
Glucose, Bld: 90 mg/dL (ref 70–99)
Potassium: 3.5 mmol/L (ref 3.5–5.1)
Sodium: 139 mmol/L (ref 135–145)

## 2019-09-10 LAB — HEMOGLOBIN A1C
Hgb A1c MFr Bld: 7.2 % — ABNORMAL HIGH (ref 4.8–5.6)
Mean Plasma Glucose: 159.94 mg/dL

## 2019-09-10 MED ORDER — FUROSEMIDE 10 MG/ML IJ SOLN
20.0000 mg | Freq: Every day | INTRAMUSCULAR | Status: DC
  Administered 2019-09-11: 20 mg via INTRAVENOUS
  Filled 2019-09-10: qty 2

## 2019-09-10 NOTE — Evaluation (Signed)
Physical Therapy Evaluation Patient Details Name: James [name] MRN: [id number] DOB: 04/02/1945 Today's Date: 09/10/2019   History of Present Illness  74 yo male with onset of fall at home was admitted, noted he slipped on water leak and hit his head.  Pt is in acute CHF episode, has pleural effusion, atelectasis, and note heart function is 40-45%.  PMHx:  venous stasis dermatitis, falls, stroke, HTN, CABG, DM, CKD, CHF  Clinical Impression  Pt was seen for mobility on RW and cued transfers for safety.  He is demonstrating weakness with all mobiltiy, cannot safely even get up to stand without help.  Will recommend he go to rehab to recover stair climbing safety, endurance and strength to LE's all to decrease fall risk given his independent living situation.  O2 sats were stable, with 95% being lowest reading obtained with gait.    Follow Up Recommendations SNF    Equipment Recommendations  Rolling walker with 5" wheels    Recommendations for Other Services       Precautions / Restrictions Precautions Precautions: Fall Precaution Comments: monitor vitals Restrictions Weight Bearing Restrictions: No      Mobility  Bed Mobility Overal bed mobility: Needs Assistance Bed Mobility: Supine to Sit     Supine to sit: Mod assist     General bed mobility comments: assist to support trunk and assist to scoot to side of bed  Transfers Overall transfer level: Needs assistance Equipment used: Rolling walker (2 wheeled);1 person hand held assist Transfers: Sit to/from Stand Sit to Stand: Min assist         General transfer comment: min assist to power up and cued hand placement 50% of the time  Ambulation/Gait Ambulation/Gait assistance: Min guard Gait Distance (Feet): 60 Feet (30 x 2) Assistive device: Rolling walker (2 wheeled);1 person hand held assist Gait Pattern/deviations: Step-through pattern;Wide base of support;Trunk flexed;Decreased stride length Gait velocity:  reduced Gait velocity interpretation: <1.31 ft/sec, indicative of household ambulator General Gait Details: pt is getting up to walk on RW, slow turns and cues for safety and to determine limits of tolerance  Stairs            Wheelchair Mobility    Modified Rankin (Stroke Patients Only) Modified Rankin (Stroke Patients Only) Pre-Morbid Rankin Score: No significant disability Modified Rankin: Moderate disability     Balance Overall balance assessment: History of Falls;Needs assistance Sitting-balance support: Feet supported Sitting balance-Leahy Scale: Good     Standing balance support: Bilateral upper extremity supported;During functional activity Standing balance-Leahy Scale: Fair Standing balance comment: less than fair dynamic balance                             Pertinent Vitals/Pain Pain Assessment: No/denies pain    Home Living Family/patient expects to be discharged to:: Private residence Living Arrangements: Alone Available Help at Discharge: Family;Available PRN/intermittently Type of Home: House Home Access: Stairs to enter Entrance Stairs-Rails: Right Entrance Stairs-Number of Steps: 3 Home Layout: Two level;Able to live on main level with bedroom/bathroom Home Equipment: Grab bars - tub/shower;Shower seat;Cane - single point Additional Comments: pt reports he goes into the basement once every 2 months, has exercise equipment in the basement    Prior Function Level of Independence: Independent with assistive device(s)         Comments: mult recent falls, on [facility]     Hand Dominance   Dominant Hand: Right    Extremity/Trunk Assessment  Upper Extremity Assessment Upper Extremity Assessment: Overall WFL for tasks assessed    Lower Extremity Assessment Lower Extremity Assessment: Generalized weakness    Cervical / Trunk Assessment Cervical / Trunk Assessment: Kyphotic  Communication   Communication: No difficulties  Cognition  Arousal/Alertness: Awake/alert Behavior During Therapy: WFL for tasks assessed/performed Overall Cognitive Status: Within Functional Limits for tasks assessed                                        General Comments General comments (skin integrity, edema, etc.): pt is getting up to walk on RW, typically SPC but is demosntrating less endurance and will be recommended to rehab stay pre-discharge    Exercises     Assessment/Plan    PT Assessment Patient needs continued PT services  PT Problem List Decreased strength;Decreased range of motion;Decreased activity tolerance;Decreased balance;Decreased mobility;Decreased coordination;Decreased knowledge of use of DME;Cardiopulmonary status limiting activity       PT Treatment Interventions DME instruction;Gait training;Stair training;Functional mobility training;Therapeutic activities;Therapeutic exercise;Balance training;Neuromuscular re-education;Patient/family education    PT Goals (Current goals can be found in the Care Plan section)  Acute Rehab PT Goals Patient Stated Goal: to get home and not fall PT Goal Formulation: With patient Time For Goal Achievement: 09/24/19 Potential to Achieve Goals: Good    Frequency Min 3X/week   Barriers to discharge Inaccessible home environment;Decreased caregiver support home alone with stairs inside and out    Co-evaluation               AM-PAC PT "6 Clicks" Mobility  Outcome Measure Help needed turning from your back to your side while in a flat bed without using bedrails?: A Little Help needed moving from lying on your back to sitting on the side of a flat bed without using bedrails?: A Little Help needed moving to and from a bed to a chair (including a wheelchair)?: A Lot Help needed standing up from a chair using your arms (e.g., wheelchair or bedside chair)?: A Little Help needed to walk in hospital room?: A Little Help needed climbing 3-5 steps with a railing? :  Total 6 Click Score: 15    End of Session Equipment Utilized During Treatment: Gait belt Activity Tolerance: Patient limited by fatigue;Treatment limited secondary to medical complications (Comment) Patient left: in chair;with call bell/phone within reach;with chair alarm set;with nursing/sitter in room Nurse Communication: Mobility status PT Visit Diagnosis: Unsteadiness on feet (R26.81);Muscle weakness (generalized) (M62.81);History of falling (Z91.81)    Time: 1448-1856 PT Time Calculation (min) (ACUTE ONLY): 28 min   Charges:   PT Evaluation $PT Eval Moderate Complexity: 1 Mod PT Treatments $Gait Training: 8-22 mins       Ivar Drape 09/10/2019, 12:45 PM  Samul Dada, PT MS Acute Rehab Dept. Number: [facility] [id number] and [facility] [phone]

## 2019-09-10 NOTE — Progress Notes (Signed)
PROGRESS NOTE  James [name] [facility] CBS:[id number] DOB: 1945/05/29 DOA: 09/09/2019 PCP: Koren Shiver, DO  HPI/Recap of past 24 hours: HPI from Dr Allyn Kenner is a 74 y.o. male with history of CAD status post CABG, and cryptogenic stroke in December 2020, CHF last EF measured in December 2020 was 40 to 45%, hypertension, chronic kidney disease stage III, diabetes mellitus type 2 presents to the ER after patient had a fall. Patient states there was some water leaking from the roof and he slipped on it and fell onto the floor hitting his head. Did not lose consciousness. Patient also has stopped taking his torsemide because he has been urinating frequently and he was finding it difficult to walk and he has stopped taking about 3 months ago. Denies chest pain or shortness of breath. Recently followed up with primary care physician and since then he states he has not been eating well and has lost some weight. In the ER CT head and C-spine was unremarkable labs show creatinine 1.5 BNP of 175 CBC was unremarkable chest x-ray shows no pleural effusion. Patient on exam has mild elevated JVD and bilateral lower extremity edema with venous stasis dermatitis. Patient admitted for fluid overload. Covid test was negative. EKG shows normal sinus rhythm.     Today, patient still reports shortness of breath, denies any chest pain, abdominal pain, nausea/vomiting, fever/chills.  Noted to be significantly deconditioned.   Assessment/Plan: Principal Problem:   Acute CHF (congestive heart failure) (HCC) Active Problems:   DM (diabetes mellitus), type 2, uncontrolled with complications (HCC)   CKD (chronic kidney disease), stage III   S/P CABG (coronary artery bypass graft)   Cryptogenic stroke (HCC)   CHF (congestive heart failure) (HCC)   Possible acute on chronic systolic HF Noted to have bilateral lower extremity edema BNP 175 Chest x-ray with small pleural effusion Echo done on 01/2019  showed EF of 40 to 45% Continue IV Lasix, reduced dose Strict I's and O's, daily weight Telemetry  CKD stage IIIa Creatinine at baseline, 1.3-1.5 Daily BMP  Hypertension Uncontrolled, poor medication compliance Continue hydralazine, Imdur, Toprol  CAD status post CABG Currently chest pain-free Continue statins, Imdur, Toprol  History of CVA Continue statins, aspirin, Plavix  Diabetes mellitus type 2 Last A1c 12.9 on 01/2019, uncontrolled, repeat A1c 7.2 Continue SSI, NovoLog 70/30, Toujeo  Recurrent falls No focal neurologic deficit noted CT head/CT spine unremarkable Multifactorial PT/OT- rec SNF  Bilateral venous stasis dermatitis Appears chronic Wound care consulted  Obesity Lifestyle modification advised      Malnutrition Type:      Malnutrition Characteristics:      Nutrition Interventions:       Estimated body mass index is 35.74 kg/m as calculated from the following:   Height as of this encounter: 5\' 9"  (1.753 m).   Weight as of this encounter: 109.8 kg.     Code Status: DNR  Family Communication: Discussed with patient extensively  Disposition Plan: Status is: Inpatient  Remains inpatient appropriate because:Inpatient level of care appropriate due to severity of illness   Dispo: The patient is from: Home              Anticipated d/c is to: Home              Anticipated d/c date is: 2 days              Patient currently is medically stable to d/c.   Consultants:  None  Procedures:  None  Antimicrobials:  None  DVT prophylaxis: Lovenox   Objective: Vitals:   09/10/19 0401 09/10/19 0428 09/10/19 0837 09/10/19 1145  BP: (!) 150/77  (!) 148/72 (!) 146/83  Pulse: 68  64 70  Resp: 20   20  Temp: 98.4 F (36.9 C)   98.1 F (36.7 C)  TempSrc: Oral   Oral  SpO2: 97%   94%  Weight:  (!) 109.8 kg    Height:        Intake/Output Summary (Last 24 hours) at 09/10/2019 1707 Last data filed at 09/10/2019 1330 Gross per  24 hour  Intake 240 ml  Output 1750 ml  Net -1510 ml   Filed Weights   09/09/19 0431 09/09/19 1700 09/10/19 0428  Weight: (!) 110 kg (!) 110.5 kg (!) 109.8 kg    Exam:  General: NAD   Cardiovascular: S1, S2 present  Respiratory: CTAB  Abdomen: Soft, nontender, nondistended, bowel sounds present  Musculoskeletal: 2+ bilateral pedal edema noted  Skin:  Bilateral chronic venous stasis dermatitis  Psychiatry: Normal mood    Data Reviewed: CBC: Recent Labs  Lab 09/09/19 0126 09/09/19 0728 09/10/19 0734  WBC 8.1 6.8 9.9  NEUTROABS  --  4.6 7.2  HGB 14.9 14.8 14.2  HCT 45.3 45.5 42.5  MCV 89.5 88.0 86.9  PLT 222 209 213   Basic Metabolic Panel: Recent Labs  Lab 09/09/19 0126 09/09/19 0728 09/10/19 0734  NA 138 137 139  K 3.7 3.6 3.5  CL 102 103 102  CO2 25 24 25   GLUCOSE 117* 118* 90  BUN 20 19 20   CREATININE 1.51* 1.48* 1.69*  CALCIUM 9.2 9.1 9.0  MG  --  2.0  --    GFR: Estimated Creatinine Clearance: 46.8 mL/min (A) (by C-G formula based on SCr of 1.69 mg/dL (H)). Liver Function Tests: Recent Labs  Lab 09/09/19 0126 09/09/19 0728  AST 21 18  ALT 16 16  ALKPHOS 72 67  BILITOT 0.9 1.1  PROT 7.0 6.9  ALBUMIN 3.9 3.7   No results for input(s): LIPASE, AMYLASE in the last 168 hours. No results for input(s): AMMONIA in the last 168 hours. Coagulation Profile: Recent Labs  Lab 09/09/19 0126  INR 1.0   Cardiac Enzymes: No results for input(s): CKTOTAL, CKMB, CKMBINDEX, TROPONINI in the last 168 hours. BNP (last 3 results) No results for input(s): PROBNP in the last 8760 hours. HbA1C: Recent Labs    09/10/19 0734  HGBA1C 7.2*   CBG: Recent Labs  Lab 09/09/19 1154 09/09/19 1714 09/09/19 2031 09/10/19 0612 09/10/19 1118  GLUCAP 201* 126* 113* 82 127*   Lipid Profile: No results for input(s): CHOL, HDL, LDLCALC, TRIG, CHOLHDL, LDLDIRECT in the last 72 hours. Thyroid Function Tests: Recent Labs    09/09/19 0731  TSH 1.237    Anemia Panel: No results for input(s): VITAMINB12, FOLATE, FERRITIN, TIBC, IRON, RETICCTPCT in the last 72 hours. Urine analysis:    Component Value Date/Time   COLORURINE YELLOW 09/09/2019 0628   APPEARANCEUR CLEAR 09/09/2019 0628   LABSPEC 1.010 09/09/2019 0628   PHURINE 6.0 09/09/2019 0628   GLUCOSEU NEGATIVE 09/09/2019 0628   HGBUR NEGATIVE 09/09/2019 0628   BILIRUBINUR NEGATIVE 09/09/2019 0628   KETONESUR NEGATIVE 09/09/2019 0628   PROTEINUR 100 (A) 09/09/2019 0628   UROBILINOGEN 1.0 10/24/2011 1742   NITRITE NEGATIVE 09/09/2019 0628   LEUKOCYTESUR NEGATIVE 09/09/2019 0628   Sepsis Labs: @LABRCNTIP (procalcitonin:4,lacticidven:4)  ) Recent Results (from the past 240 hour(s))  SARS Coronavirus 2 by  RT PCR (hospital order, performed in [facility] hospital lab) Nasopharyngeal Nasopharyngeal Swab     Status: None   Collection Time: 09/09/19  3:15 AM   Specimen: Nasopharyngeal Swab  Result Value Ref Range Status   SARS Coronavirus 2 NEGATIVE NEGATIVE Final    Comment: (NOTE) SARS-CoV-2 target nucleic acids are NOT DETECTED.  The SARS-CoV-2 RNA is generally detectable in upper and lower respiratory specimens during the acute phase of infection. The lowest concentration of SARS-CoV-2 viral copies this assay can detect is 250 copies / mL. A negative result does not preclude SARS-CoV-2 infection and should not be used as the sole basis for treatment or other patient management decisions.  A negative result may occur with improper specimen collection / handling, submission of specimen other than nasopharyngeal swab, presence of viral mutation(s) within the areas targeted by this assay, and inadequate number of viral copies (<250 copies / mL). A negative result must be combined with clinical observations, patient history, and epidemiological information.  Fact Sheet for Patients:   BoilerBrush.com.cy  Fact Sheet for Healthcare  Providers: https://pope.com/  This test is not yet approved or  cleared by the [place] FDA and has been authorized for detection and/or diagnosis of SARS-CoV-2 by FDA under an Emergency Use Authorization (EUA).  This EUA will remain in effect (meaning this test can be used) for the duration of the COVID-19 declaration under Section 564(b)(1) of the Act, 21 U.S.C. section 360bbb-3(b)(1), unless the authorization is terminated or revoked sooner.  Performed at [facility], [address] [place], [place] [address]       Studies: No results found.  Scheduled Meds: . aspirin  81 mg Oral Daily  . atorvastatin  80 mg Oral Daily  . clopidogrel  75 mg Oral Daily  . enoxaparin (LOVENOX) injection  40 mg Subcutaneous Q24H  . ezetimibe  10 mg Oral Daily  . [START ON 09/11/2019] furosemide  20 mg Intravenous Daily  . hydrALAZINE  25 mg Oral Q8H  . insulin aspart  0-9 Units Subcutaneous TID WC  . insulin aspart protamine- aspart  8 Units Subcutaneous BID WC  . insulin glargine  22 Units Subcutaneous QHS  . iron polysaccharides  150 mg Oral Daily  . isosorbide mononitrate  60 mg Oral Daily  . metoprolol succinate  50 mg Oral Daily  . omega-3 acid ethyl esters  2,000 mg Oral Daily  . polyethylene glycol  17 g Oral Daily  . potassium chloride  10 mEq Oral Daily  . pramipexole  1.5 mg Oral QPM    Continuous Infusions:   LOS: 1 day     Briant Cedar, MD [facility]  If 7PM-7AM, please contact night-coverage [email or web address] 09/10/2019, 5:07 PM

## 2019-09-11 ENCOUNTER — Inpatient Hospital Stay (HOSPITAL_COMMUNITY): Payer: Medicare HMO

## 2019-09-11 DIAGNOSIS — I5023 Acute on chronic systolic (congestive) heart failure: Secondary | ICD-10-CM

## 2019-09-11 LAB — BASIC METABOLIC PANEL
Anion gap: 9 (ref 5–15)
BUN: 21 mg/dL (ref 8–23)
CO2: 25 mmol/L (ref 22–32)
Calcium: 8.7 mg/dL — ABNORMAL LOW (ref 8.9–10.3)
Chloride: 104 mmol/L (ref 98–111)
Creatinine, Ser: 1.6 mg/dL — ABNORMAL HIGH (ref 0.61–1.24)
GFR calc Af Amer: 48 mL/min — ABNORMAL LOW (ref >60)
GFR calc non Af Amer: 42 mL/min — ABNORMAL LOW (ref >60)
Glucose, Bld: 83 mg/dL (ref 70–99)
Potassium: 3.5 mmol/L (ref 3.5–5.1)
Sodium: 138 mmol/L (ref 135–145)

## 2019-09-11 LAB — CBC WITH DIFFERENTIAL/PLATELET
Abs Immature Granulocytes: 0.06 10*3/uL (ref 0.00–0.07)
Basophils Absolute: 0.1 10*3/uL (ref 0.0–0.1)
Basophils Relative: 1 %
Eosinophils Absolute: 0.3 10*3/uL (ref 0.0–0.5)
Eosinophils Relative: 4 %
HCT: 42.3 % (ref 39.0–52.0)
Hemoglobin: 14 g/dL (ref 13.0–17.0)
Immature Granulocytes: 1 %
Lymphocytes Relative: 12 %
Lymphs Abs: 1.1 10*3/uL (ref 0.7–4.0)
MCH: 29.1 pg (ref 26.0–34.0)
MCHC: 33.1 g/dL (ref 30.0–36.0)
MCV: 87.9 fL (ref 80.0–100.0)
Monocytes Absolute: 1.1 10*3/uL — ABNORMAL HIGH (ref 0.1–1.0)
Monocytes Relative: 12 %
Neutro Abs: 6.5 10*3/uL (ref 1.7–7.7)
Neutrophils Relative %: 70 %
Platelets: 211 10*3/uL (ref 150–400)
RBC: 4.81 MIL/uL (ref 4.22–5.81)
RDW: 13.1 % (ref 11.5–15.5)
WBC: 9.2 10*3/uL (ref 4.0–10.5)
nRBC: 0 % (ref 0.0–0.2)

## 2019-09-11 LAB — GLUCOSE, CAPILLARY
Glucose-Capillary: 80 mg/dL (ref 70–99)
Glucose-Capillary: 138 mg/dL — ABNORMAL HIGH (ref 70–99)
Glucose-Capillary: 142 mg/dL — ABNORMAL HIGH (ref 70–99)
Glucose-Capillary: 145 mg/dL — ABNORMAL HIGH (ref 70–99)

## 2019-09-11 LAB — ECHOCARDIOGRAM LIMITED
Area-P 1/2: 2.99 cm2
Height: 69 in
S' Lateral: 3.7 cm
Weight: 3854.4 oz

## 2019-09-11 MED ORDER — FUROSEMIDE 10 MG/ML IJ SOLN
40.0000 mg | Freq: Two times a day (BID) | INTRAMUSCULAR | Status: DC
  Administered 2019-09-11 – 2019-09-12 (×2): 40 mg via INTRAVENOUS
  Filled 2019-09-11 (×2): qty 4

## 2019-09-11 NOTE — Progress Notes (Signed)
OT Cancellation Note  Patient Details Name: MILFRED KRAMMES MRN: [id number] DOB: July 26, 1945   Cancelled Treatment:    Reason Eval/Treat Not Completed: Other (comment). Pt just received his breakfast, will try back later this AM to complete evaluation.  Ignacia Palma, OTR/L Acute Rehab Services Pager [phone] Office [phone]     Evette Georges 09/11/2019, 7:51 AM

## 2019-09-11 NOTE — Evaluation (Signed)
Occupational Therapy Evaluation Patient Details Name: James [name] MRN: [id number] DOB: [birth year] Today's Date: 09/11/2019    History of Present Illness 74 yo male with onset of fall at home was admitted, noted he slipped on water leak and hit his head.  Pt is in acute CHF episode, has pleural effusion, atelectasis, and note heart function is 40-45%.  PMHx:  venous stasis dermatitis, falls, stroke, HTN, CABG, DM, CKD, CHF   Clinical Impression   This 74 yo male admitted with above presents to acute OT with PLOF of being Independent to Mod I with basic ADLs/IADLs and he lives alone. Currently he is setup/S-min A for basic ADLs/mobility and needs to be Mod I to independent to return home alone. He will benefit from continued acute OT with follow up at SNF to get to this level.    Follow Up Recommendations  SNF;Supervision/Assistance - 24 hour    Equipment Recommendations  None recommended by OT       Precautions / Restrictions Precautions Precautions: Fall Restrictions Weight Bearing Restrictions: No      Mobility Bed Mobility               General bed mobility comments: Pt up in recliner upon arrival  Transfers Overall transfer level: Needs assistance Equipment used: Rolling walker (2 wheeled) Transfers: Sit to/from Stand Sit to Stand: Min guard         General transfer comment: VCs for safe hand placement; pt ambulated 75 feet with RW and intermittent Min A    Balance Overall balance assessment: Needs assistance Sitting-balance support: No upper extremity supported;Feet supported Sitting balance-Leahy Scale: Good     Standing balance support: No upper extremity supported Standing balance-Leahy Scale: Fair                             ADL either performed or assessed with clinical judgement   ADL Overall ADL's : Needs assistance/impaired Eating/Feeding: Independent;Sitting   Grooming: Set up;Sitting   Upper Body Bathing: Set up;Sitting    Lower Body Bathing: Min guard;Sit to/from stand Lower Body Bathing Details (indicate cue type and reason): really struggles to get to feet Upper Body Dressing : Set up;Sitting   Lower Body Dressing: Min guard;Sit to/from stand Lower Body Dressing Details (indicate cue type and reason): really struggles to get to feet Toilet Transfer: Min guard;Ambulation;Comfort height toilet;Grab bars;RW   Toileting- Architect and Hygiene: Min guard;Sit to/from stand               Vision Patient Visual Report: No change from baseline              Pertinent Vitals/Pain Pain Assessment: No/denies pain     Hand Dominance Right   Extremity/Trunk Assessment Upper Extremity Assessment Upper Extremity Assessment: Overall WFL for tasks assessed           Communication Communication Communication: No difficulties   Cognition Arousal/Alertness: Awake/alert Behavior During Therapy: WFL for tasks assessed/performed Overall Cognitive Status: Within Functional Limits for tasks assessed                                                Home Living Family/patient expects to be discharged to:: Skilled nursing facility Living Arrangements: Alone Available Help at Discharge: Family;Available PRN/intermittently Type of Home: House Home Access: Stairs  to enter Entrance Stairs-Number of Steps: 3   Home Layout: Two level;Able to live on main level with bedroom/bathroom     Bathroom Shower/Tub: Producer, television/film/video: Handicapped height Bathroom Accessibility: Yes   Home Equipment: Grab bars - tub/shower;Shower seat;Cane - single point          Prior Functioning/Environment Level of Independence: Independent with assistive device(s)        Comments: report she cooks very little, eats out alot, drives, has dog (Freddy)--actually grandson's dog but lives with patient        OT Problem List: Impaired balance (sitting and/or standing)      OT  Treatment/Interventions: Self-care/ADL training;DME and/or AE instruction;Patient/family education;Balance training    OT Goals(Current goals can be found in the care plan section) Acute Rehab OT Goals Patient Stated Goal: to go home but agreeable to rehab first OT Goal Formulation: With patient Time For Goal Achievement: 09/25/19 Potential to Achieve Goals: Good  OT Frequency: Min 2X/week   Barriers to D/C: Decreased caregiver support             AM-PAC OT "6 Clicks" Daily Activity     Outcome Measure Help from another person eating meals?: None Help from another person taking care of personal grooming?: A Little Help from another person toileting, which includes using toliet, bedpan, or urinal?: A Little Help from another person bathing (including washing, rinsing, drying)?: A Little Help from another person to put on and taking off regular upper body clothing?: A Little Help from another person to put on and taking off regular lower body clothing?: A Little 6 Click Score: 19   End of Session Equipment Utilized During Treatment: Gait belt;Rolling walker Nurse Communication:  (RN in room when we came back from ambulating in hall--she verbalized "oh he is moving well")  Activity Tolerance: Patient tolerated treatment well Patient left: in chair;with call bell/phone within reach;with chair alarm set  OT Visit Diagnosis: Unsteadiness on feet (R26.81);Other abnormalities of gait and mobility (R26.89);Repeated falls (R29.6);History of falling (Z91.81)                Time: 2683-4196 OT Time Calculation (min): 24 min Charges:  OT General Charges $OT Visit: 1 Visit OT Evaluation $OT Eval Moderate Complexity: 1 Mod OT Treatments $Self Care/Home Management : 8-22 mins  Ignacia Palma, OTR/L Acute [employer] Pager [phone] Office [phone]     Evette Georges 09/11/2019, 9:03 AM

## 2019-09-11 NOTE — Plan of Care (Signed)
  Problem: Nutrition: Goal: Adequate nutrition will be maintained Outcome: Completed/Met   Problem: Pain Managment: Goal: General experience of comfort will improve Outcome: Completed/Met

## 2019-09-11 NOTE — Plan of Care (Signed)
  Problem: Education: Goal: Knowledge of General Education information will improve Description: Including pain rating scale, medication(s)/side effects and non-pharmacologic comfort measures Outcome: Progressing   Problem: Health Behavior/Discharge Planning: Goal: Ability to manage health-related needs will improve Outcome: Progressing   Problem: Clinical Measurements: Goal: Ability to maintain clinical measurements within normal limits will improve Outcome: Progressing Goal: Will remain free from infection Outcome: Progressing Goal: Diagnostic test results will improve Outcome: Progressing Goal: Respiratory complications will improve Outcome: Progressing Goal: Cardiovascular complication will be avoided Outcome: Progressing   Problem: Activity: Goal: Risk for activity intolerance will decrease Outcome: Progressing   Problem: Coping: Goal: Level of anxiety will decrease Outcome: Progressing   Problem: Elimination: Goal: Will not experience complications related to bowel motility Outcome: Progressing Goal: Will not experience complications related to urinary retention Outcome: Progressing   Problem: Safety: Goal: Ability to remain free from injury will improve Outcome: Progressing   Problem: Skin Integrity: Goal: Risk for impaired skin integrity will decrease Outcome: Progressing   Problem: Education: Goal: Ability to demonstrate management of disease process will improve Outcome: Progressing Goal: Ability to verbalize understanding of medication therapies will improve Outcome: Progressing Goal: Individualized Educational Video(s) Outcome: Progressing   Problem: Activity: Goal: Capacity to carry out activities will improve Outcome: Progressing   Problem: Cardiac: Goal: Ability to achieve and maintain adequate cardiopulmonary perfusion will improve Outcome: Progressing   

## 2019-09-11 NOTE — Progress Notes (Signed)
  Echocardiogram 2D Echocardiogram has been performed.  James [name] 09/11/2019, 10:52 AM

## 2019-09-11 NOTE — Progress Notes (Signed)
PROGRESS NOTE  James [name] DOA: 09/09/2019 PCP: James [name]  HPI/Recap of past 24 hours: HPI from James [name] is a 74 y.o. male with history of CAD status post CABG, and cryptogenic stroke in December 2020, CHF last EF measured in December 2020 was 40 to 45%, hypertension, chronic kidney disease stage III, diabetes mellitus type 2 presents to the ER after patient had a fall. Patient states there was some water leaking from the roof and he slipped on it and fell onto the floor hitting his head. Did not lose consciousness. Patient also has stopped taking his torsemide because he has been urinating frequently and he was finding it difficult to walk and he has stopped taking about 3 months ago. Denies chest pain or shortness of breath. Recently followed up with primary care physician and since then he states he has not been eating well and has lost some weight. In the ER CT head and C-spine was unremarkable labs show creatinine 1.5 BNP of 175 CBC was unremarkable chest x-ray shows no pleural effusion. Patient on exam has mild elevated JVD and bilateral lower extremity edema with venous stasis dermatitis. Patient admitted for fluid overload. Covid test was negative. EKG shows normal sinus rhythm.     Today, patient still reported some shortness of breath, denied any other new complaints.   Assessment/Plan: Principal Problem:   Acute CHF (congestive heart failure) (HCC) Active Problems:   DM (diabetes mellitus), type 2, uncontrolled with complications (HCC)   CKD (chronic kidney disease), stage III   S/P CABG (coronary artery bypass graft)   Cryptogenic stroke (HCC)   CHF (congestive heart failure) (HCC)   Possible acute on chronic systolic HF Noted to have bilateral lower extremity edema BNP 175 Chest x-ray with small pleural effusion Repeats Echo done showed EF of 40 to 45%, unchanged from previous Continue IV Lasix Strict I's  and O's, daily weight Telemetry  CKD stage IIIa Creatinine at baseline, 1.3-1.5 Daily BMP  Hypertension Uncontrolled, poor medication compliance Continue hydralazine, Imdur, Toprol  CAD status post CABG Currently chest pain-free Continue statins, Imdur, Toprol  History of CVA Continue statins, aspirin, Plavix  Diabetes mellitus type 2 Last A1c 12.9 on 01/2019, uncontrolled, repeat A1c 7.2 Continue SSI, lantus, Accu-Cheks, hypoglycemic protocol  Recurrent falls No focal neurologic deficit noted CT head/CT spine unremarkable Multifactorial PT/OT- rec SNF  Bilateral venous stasis dermatitis Appears chronic Wound care consulted  Obesity Lifestyle modification advised      Malnutrition Type:      Malnutrition Characteristics:      Nutrition Interventions:       Estimated body mass index is 35.57 kg/m as calculated from the following:   Height as of this encounter: 5\' 9"  (1.753 m).   Weight as of this encounter: 109.3 kg.     Code Status: DNR  Family Communication: Discussed with patient extensively  Disposition Plan: Status is: Inpatient  Remains inpatient appropriate because:Inpatient level of care appropriate due to severity of illness   Dispo: The patient is from: Home              Anticipated d/c is to: Home              Anticipated d/c date is: 2 days              Patient currently is medically stable to d/c.   Consultants:  None  Procedures:  None  Antimicrobials:  None  DVT prophylaxis: Lovenox   Objective: Vitals:   09/11/19 0417 09/11/19 0732 09/11/19 1158 09/11/19 1656  BP:  (!) 155/72 (!) 138/72 (!) 121/50  Pulse:  63 53 65  Resp:  20 20   Temp:  98.2 F (36.8 C) (!) 97.1 F (36.2 C) 98.3 F (36.8 C)  TempSrc:  Oral Oral Oral  SpO2:  98% 98% 93%  Weight: (!) 109.3 kg     Height:        Intake/Output Summary (Last 24 hours) at 09/11/2019 1742 Last data filed at 09/11/2019 1300 Gross per 24 hour  Intake 720  ml  Output 1075 ml  Net -355 ml   Filed Weights   09/09/19 1700 09/10/19 0428 09/11/19 0417  Weight: (!) 110.5 kg (!) 109.8 kg (!) 109.3 kg    Exam:  General: NAD   Cardiovascular: S1, S2 present  Respiratory: CTAB  Abdomen: Soft, nontender, nondistended, bowel sounds present  Musculoskeletal: 2+ bilateral pedal edema noted  Skin:  Bilateral chronic venous stasis dermatitis  Psychiatry: Normal mood    Data Reviewed: CBC: Recent Labs  Lab 09/09/19 0126 09/09/19 0728 09/10/19 0734 09/11/19 0617  WBC 8.1 6.8 9.9 9.2  NEUTROABS  --  4.6 7.2 6.5  HGB 14.9 14.8 14.2 14.0  HCT 45.3 45.5 42.5 42.3  MCV 89.5 88.0 86.9 87.9  PLT 222 209 213 211   Basic Metabolic Panel: Recent Labs  Lab 09/09/19 0126 09/09/19 0728 09/10/19 0734 09/11/19 0617  NA 138 137 139 138  K 3.7 3.6 3.5 3.5  CL 102 103 102 104  CO2 25 24 25 25   GLUCOSE 117* 118* 90 83  BUN 20 19 20 21   CREATININE 1.51* 1.48* 1.69* 1.60*  CALCIUM 9.2 9.1 9.0 8.7*  MG  --  2.0  --   --    GFR: Estimated Creatinine Clearance: 49.3 mL/min (A) (by C-G formula based on SCr of 1.6 mg/dL (H)). Liver Function Tests: Recent Labs  Lab 09/09/19 0126 09/09/19 0728  AST 21 18  ALT 16 16  ALKPHOS 72 67  BILITOT 0.9 1.1  PROT 7.0 6.9  ALBUMIN 3.9 3.7   No results for input(s): LIPASE, AMYLASE in the last 168 hours. No results for input(s): AMMONIA in the last 168 hours. Coagulation Profile: Recent Labs  Lab 09/09/19 0126  INR 1.0   Cardiac Enzymes: No results for input(s): CKTOTAL, CKMB, CKMBINDEX, TROPONINI in the last 168 hours. BNP (last 3 results) No results for input(s): PROBNP in the last 8760 hours. HbA1C: Recent Labs    09/10/19 0734  HGBA1C 7.2*   CBG: Recent Labs  Lab 09/10/19 1734 09/10/19 2110 09/11/19 0658 09/11/19 1114 09/11/19 1653  GLUCAP 112* 143* 80 138* 142*   Lipid Profile: No results for input(s): CHOL, HDL, LDLCALC, TRIG, CHOLHDL, LDLDIRECT in the last 72  hours. Thyroid Function Tests: Recent Labs    09/09/19 0731  TSH 1.237   Anemia Panel: No results for input(s): VITAMINB12, FOLATE, FERRITIN, TIBC, IRON, RETICCTPCT in the last 72 hours. Urine analysis:    Component Value Date/Time   COLORURINE YELLOW 09/09/2019 0628   APPEARANCEUR CLEAR 09/09/2019 0628   LABSPEC 1.010 09/09/2019 0628   PHURINE 6.0 09/09/2019 0628   GLUCOSEU NEGATIVE 09/09/2019 0628   HGBUR NEGATIVE 09/09/2019 0628   BILIRUBINUR NEGATIVE 09/09/2019 0628   KETONESUR NEGATIVE 09/09/2019 0628   PROTEINUR 100 (A) 09/09/2019 0628   UROBILINOGEN 1.0 10/24/2011 1742   NITRITE NEGATIVE 09/09/2019 0628   LEUKOCYTESUR NEGATIVE 09/09/2019 09/11/2019  Sepsis Labs: @LABRCNTIP (procalcitonin:4,lacticidven:4)  ) Recent Results (from the past 240 hour(s))  SARS Coronavirus 2 by RT PCR (hospital order, performed in [facility] hospital lab) Nasopharyngeal Nasopharyngeal Swab     Status: None   Collection Time: 09/09/19  3:15 AM   Specimen: Nasopharyngeal Swab  Result Value Ref Range Status   SARS Coronavirus 2 NEGATIVE NEGATIVE Final    Comment: (NOTE) SARS-CoV-2 target nucleic acids are NOT DETECTED.  The SARS-CoV-2 RNA is generally detectable in upper and lower respiratory specimens during the acute phase of infection. The lowest concentration of SARS-CoV-2 viral copies this assay can detect is 250 copies / mL. A negative result does not preclude SARS-CoV-2 infection and should not be used as the sole basis for treatment or other patient management decisions.  A negative result may occur with improper specimen collection / handling, submission of specimen other than nasopharyngeal swab, presence of viral mutation(s) within the areas targeted by this assay, and inadequate number of viral copies (<250 copies / mL). A negative result must be combined with clinical observations, patient history, and epidemiological information.  Fact Sheet for Patients:    09/11/19  Fact Sheet for Healthcare Providers: BoilerBrush.com.cy  This test is not yet approved or  cleared by the https://pope.com/ FDA and has been authorized for detection and/or diagnosis of SARS-CoV-2 by FDA under an Emergency Use Authorization (EUA).  This EUA will remain in effect (meaning this test can be used) for the duration of the COVID-19 declaration under Section 564(b)(1) of the Act, 21 U.S.C. section 360bbb-3(b)(1), unless the authorization is terminated or revoked sooner.  Performed at [facility], [address], [place], [place]       Studies: ECHOCARDIOGRAM LIMITED  Result Date: 09/11/2019    ECHOCARDIOGRAM LIMITED REPORT   Patient Name:   James [name] Date of Exam: 09/11/2019 Medical Rec #:  09/13/2019     Height:       69.0 in Accession #:    [id number]    Weight:       240.9 lb Date of Birth:  04-26-1945     BSA:          2.236 m Patient Age:    74 years      BP:           155/72 mmHg Patient Gender: M             HR:           63 bpm. Exam Location:  Inpatient Procedure: Limited Echo, Limited Color Doppler and Cardiac Doppler Indications:    I50.23 Acute on chronic systolic (congestive) heart failure  History:        Patient has prior history of Echocardiogram examinations, most                 recent 02/06/2019. Arrythmias:Atrial Flutter. CKD. Dilated                 Aortic Root. Ascending Aortic Aneurysm.  Sonographer:    Tiffany Dance Referring Phys: 02/08/2019 [id number] J Zhara Gieske IMPRESSIONS  1. Left ventricular ejection fraction, by estimation, is 45 to 50%. The left ventricle has mildly decreased function. The left ventricle demonstrates global hypokinesis. There is mild left ventricular hypertrophy. There is moderate hypokinesis of the left ventricular, entire septal wall.  2. Right ventricular systolic function is mildly reduced. The right ventricular size is normal.  3. Left atrial size was  mildly dilated.  4. The aortic  valve was not well visualized. Aortic valve regurgitation is not visualized.  5. The inferior vena cava is normal in size with greater than 50% respiratory variability, suggesting right atrial pressure of 3 mmHg.6.The ascending aorta meassures up to 3.5 cm at the most distally measured point. Comparison(s): Changes from prior study are noted. 02/06/19: LVEF 40-45%, global hypokinesis. FINDINGS  Left Ventricle: Left ventricular ejection fraction, by estimation, is 45 to 50%. The left ventricle has mildly decreased function. The left ventricle demonstrates global hypokinesis. Moderate hypokinesis of the left ventricular, entire septal wall. There is mild left ventricular hypertrophy. Right Ventricle: The right ventricular size is normal. No increase in right ventricular wall thickness. Right ventricular systolic function is mildly reduced. Left Atrium: Left atrial size was mildly dilated. Right Atrium: Right atrial size was normal in size. Aortic Valve: The aortic valve was not well visualized. Aortic valve regurgitation is not visualized. Pulmonic Valve: The pulmonic valve was grossly normal. Pulmonic valve regurgitation is trivial. Aorta: The aortic root and ascending aorta are structurally normal, with no evidence of dilitation. Venous: The inferior vena cava is normal in size with greater than 50% respiratory variability, suggesting right atrial pressure of 3 mmHg. IAS/Shunts: The interatrial septum was not well visualized. LEFT VENTRICLE PLAX 2D LVIDd:         5.20 cm LVIDs:         3.70 cm LV PW:         1.10 cm LV IVS:        1.30 cm LVOT diam:     2.00 cm LV SV:         51 LV SV Index:   23 LVOT Area:     3.14 cm  RIGHT VENTRICLE            IVC RV Basal diam:  2.60 cm    IVC diam: 1.80 cm RV S prime:     7.07 cm/s TAPSE (M-mode): 1.2 cm LEFT ATRIUM             Index       RIGHT ATRIUM           Index LA diam:        5.50 cm 2.46 cm/m  RA Area:     19.10 cm LA Vol (A2C):   60.7  ml 27.15 ml/m RA Volume:   49.10 ml  21.96 ml/m LA Vol (A4C):   70.7 ml 31.62 ml/m LA Biplane Vol: 66.1 ml 29.56 ml/m  AORTIC VALVE LVOT Vmax:   77.95 cm/s LVOT Vmean:  54.200 cm/s LVOT VTI:    0.164 m  AORTA Ao Root diam: 3.50 cm Ao Asc diam:  3.50 cm MITRAL VALVE MV Area (PHT): 2.99 cm    SHUNTS MV Decel Time: 254 msec    Systemic VTI:  0.16 m MV E velocity: 69.00 cm/s  Systemic Diam: 2.00 cm MV A velocity: 73.30 cm/s MV E/A ratio:  0.94 Zoila Shutter MD Electronically signed by Zoila Shutter MD Signature Date/Time: 09/11/2019/1:00:37 PM    Final     Scheduled Meds: . aspirin  81 mg Oral Daily  . atorvastatin  80 mg Oral Daily  . clopidogrel  75 mg Oral Daily  . enoxaparin (LOVENOX) injection  40 mg Subcutaneous Q24H  . ezetimibe  10 mg Oral Daily  . furosemide  20 mg Intravenous Daily  . hydrALAZINE  25 mg Oral Q8H  . insulin aspart  0-9 Units Subcutaneous TID WC  . insulin glargine  22 Units Subcutaneous QHS  . iron polysaccharides  150 mg Oral Daily  . isosorbide mononitrate  60 mg Oral Daily  . metoprolol succinate  50 mg Oral Daily  . omega-3 acid ethyl esters  2,000 mg Oral Daily  . polyethylene glycol  17 g Oral Daily  . potassium chloride  10 mEq Oral Daily  . pramipexole  1.5 mg Oral QPM    Continuous Infusions:   LOS: 2 days     Briant Cedar, MD [facility]  If 7PM-7AM, please contact night-coverage [email or web address] 09/11/2019, 5:42 PM

## 2019-09-12 LAB — BASIC METABOLIC PANEL
Anion gap: 9 (ref 5–15)
BUN: 25 mg/dL — ABNORMAL HIGH (ref 8–23)
CO2: 28 mmol/L (ref 22–32)
Calcium: 8.8 mg/dL — ABNORMAL LOW (ref 8.9–10.3)
Chloride: 101 mmol/L (ref 98–111)
Creatinine, Ser: 1.91 mg/dL — ABNORMAL HIGH (ref 0.61–1.24)
GFR calc Af Amer: 39 mL/min — ABNORMAL LOW (ref >60)
GFR calc non Af Amer: 34 mL/min — ABNORMAL LOW (ref >60)
Glucose, Bld: 93 mg/dL (ref 70–99)
Potassium: 3.9 mmol/L (ref 3.5–5.1)
Sodium: 138 mmol/L (ref 135–145)

## 2019-09-12 LAB — CBC WITH DIFFERENTIAL/PLATELET
Abs Immature Granulocytes: 0.07 10*3/uL (ref 0.00–0.07)
Basophils Absolute: 0.1 10*3/uL (ref 0.0–0.1)
Basophils Relative: 1 %
Eosinophils Absolute: 0.3 10*3/uL (ref 0.0–0.5)
Eosinophils Relative: 3 %
HCT: 43.5 % (ref 39.0–52.0)
Hemoglobin: 14.4 g/dL (ref 13.0–17.0)
Immature Granulocytes: 1 %
Lymphocytes Relative: 13 %
Lymphs Abs: 1.1 10*3/uL (ref 0.7–4.0)
MCH: 29 pg (ref 26.0–34.0)
MCHC: 33.1 g/dL (ref 30.0–36.0)
MCV: 87.7 fL (ref 80.0–100.0)
Monocytes Absolute: 1 10*3/uL (ref 0.1–1.0)
Monocytes Relative: 13 %
Neutro Abs: 5.6 10*3/uL (ref 1.7–7.7)
Neutrophils Relative %: 69 %
Platelets: 219 10*3/uL (ref 150–400)
RBC: 4.96 MIL/uL (ref 4.22–5.81)
RDW: 13 % (ref 11.5–15.5)
WBC: 8.1 10*3/uL (ref 4.0–10.5)
nRBC: 0 % (ref 0.0–0.2)

## 2019-09-12 LAB — GLUCOSE, CAPILLARY
Glucose-Capillary: 94 mg/dL (ref 70–99)
Glucose-Capillary: 106 mg/dL — ABNORMAL HIGH (ref 70–99)
Glucose-Capillary: 116 mg/dL — ABNORMAL HIGH (ref 70–99)
Glucose-Capillary: 123 mg/dL — ABNORMAL HIGH (ref 70–99)

## 2019-09-12 NOTE — Progress Notes (Signed)
PROGRESS NOTE  James [name] [facility] NGE:[id number] DOB: 1945-12-12 DOA: 09/09/2019 PCP: Koren Shiver, DO  HPI/Recap of past 24 hours: HPI from Dr Allyn Kenner is a 74 y.o. male with history of CAD status post CABG, and cryptogenic stroke in December 2020, CHF last EF measured in December 2020 was 40 to 45%, hypertension, chronic kidney disease stage III, diabetes mellitus type 2 presents to the ER after patient had a fall. Patient states there was some water leaking from the roof and he slipped on it and fell onto the floor hitting his head. Did not lose consciousness. Patient also has stopped taking his torsemide because he has been urinating frequently and he was finding it difficult to walk and he has stopped taking about 3 months ago. Denies chest pain or shortness of breath. Recently followed up with primary care physician and since then he states he has not been eating well and has lost some weight. In the ER CT head and C-spine was unremarkable labs show creatinine 1.5 BNP of 175 CBC was unremarkable chest x-ray shows no pleural effusion. Patient on exam has mild elevated JVD and bilateral lower extremity edema with venous stasis dermatitis. Patient admitted for fluid overload. Covid test was negative. EKG shows normal sinus rhythm.     Today, patient denies any new complaints, denied any worsening shortness of breath, chest pain, abdominal pain, nausea/vomiting, fever/chills.     Assessment/Plan: Principal Problem:   Acute CHF (congestive heart failure) (HCC) Active Problems:   DM (diabetes mellitus), type 2, uncontrolled with complications (HCC)   CKD (chronic kidney disease), stage III   S/P CABG (coronary artery bypass graft)   Cryptogenic stroke (HCC)   CHF (congestive heart failure) (HCC)   Possible acute on chronic systolic HF Noted to have bilateral lower extremity edema BNP 175 Chest x-ray with small pleural effusion Repeats Echo done showed EF of 40 to  45%, unchanged from previous Held IV Lasix due to bump on creatinine Strict I's and O's, daily weight Telemetry  CKD stage IIIa Creatinine at baseline, 1.3-1.5, slight bump in Cr Daily BMP  Hypertension Uncontrolled, poor medication compliance Continue hydralazine, Imdur, Toprol  CAD status post CABG Currently chest pain-free Continue statins, Imdur, Toprol  History of CVA Continue statins, aspirin, Plavix  Diabetes mellitus type 2 Last A1c 12.9 on 01/2019, uncontrolled, repeat A1c 7.2 Continue SSI, lantus, Accu-Cheks, hypoglycemic protocol  Recurrent falls No focal neurologic deficit noted CT head/CT spine unremarkable Multifactorial PT/OT- rec SNF  Bilateral venous stasis dermatitis Appears chronic Wound care consulted  Obesity Lifestyle modification advised      Malnutrition Type:      Malnutrition Characteristics:      Nutrition Interventions:       Estimated body mass index is 35.24 kg/m as calculated from the following:   Height as of this encounter: 5\' 9"  (1.753 m).   Weight as of this encounter: 108.2 kg.     Code Status: DNR  Family Communication: Discussed with patient extensively  Disposition Plan: Status is: Inpatient  Remains inpatient appropriate because:Inpatient level of care appropriate due to severity of illness   Dispo: The patient is from: Home              Anticipated d/c is to: Home              Anticipated d/c date is: 2 days              Patient currently is medically stable  to d/c.   Consultants:  None  Procedures:  None  Antimicrobials:  None  DVT prophylaxis: Lovenox   Objective: Vitals:   09/12/19 0355 09/12/19 0906 09/12/19 1205 09/12/19 1341  BP: (!) 143/79 (!) 160/66 (!) 137/63 111/70  Pulse: 59 76 70 67  Resp: 16 15 16    Temp: (!) 97.4 F (36.3 C)  99.3 F (37.4 C)   TempSrc: Oral  Oral   SpO2: 97% 98% 98%   Weight: (!) 108.2 kg     Height:        Intake/Output Summary (Last 24  hours) at 09/12/2019 1601 Last data filed at 09/12/2019 1300 Gross per 24 hour  Intake 1200 ml  Output 2475 ml  Net -1275 ml   Filed Weights   09/10/19 0428 09/11/19 0417 09/12/19 0355  Weight: (!) 109.8 kg (!) 109.3 kg (!) 108.2 kg    Exam:  General: NAD   Cardiovascular: S1, S2 present  Respiratory: CTAB  Abdomen: Soft, nontender, nondistended, bowel sounds present  Musculoskeletal: Trace+ bilateral pedal edema noted  Skin:  Bilateral chronic venous stasis dermatitis  Psychiatry: Normal mood    Data Reviewed: CBC: Recent Labs  Lab 09/09/19 0126 09/09/19 0728 09/10/19 0734 09/11/19 0617 09/12/19 0605  WBC 8.1 6.8 9.9 9.2 8.1  NEUTROABS  --  4.6 7.2 6.5 5.6  HGB 14.9 14.8 14.2 14.0 14.4  HCT 45.3 45.5 42.5 42.3 43.5  MCV 89.5 88.0 86.9 87.9 87.7  PLT 222 209 213 211 219   Basic Metabolic Panel: Recent Labs  Lab 09/09/19 0126 09/09/19 0728 09/10/19 0734 09/11/19 0617 09/12/19 0605  NA 138 137 139 138 138  K 3.7 3.6 3.5 3.5 3.9  CL 102 103 102 104 101  CO2 25 24 25 25 28   GLUCOSE 117* 118* 90 83 93  BUN 20 19 20 21  25*  CREATININE 1.51* 1.48* 1.69* 1.60* 1.91*  CALCIUM 9.2 9.1 9.0 8.7* 8.8*  MG  --  2.0  --   --   --    GFR: Estimated Creatinine Clearance: 41.1 mL/min (A) (by C-G formula based on SCr of 1.91 mg/dL (H)). Liver Function Tests: Recent Labs  Lab 09/09/19 0126 09/09/19 0728  AST 21 18  ALT 16 16  ALKPHOS 72 67  BILITOT 0.9 1.1  PROT 7.0 6.9  ALBUMIN 3.9 3.7   No results for input(s): LIPASE, AMYLASE in the last 168 hours. No results for input(s): AMMONIA in the last 168 hours. Coagulation Profile: Recent Labs  Lab 09/09/19 0126  INR 1.0   Cardiac Enzymes: No results for input(s): CKTOTAL, CKMB, CKMBINDEX, TROPONINI in the last 168 hours. BNP (last 3 results) No results for input(s): PROBNP in the last 8760 hours. HbA1C: Recent Labs    09/10/19 0734  HGBA1C 7.2*   CBG: Recent Labs  Lab 09/11/19 1114  09/11/19 1653 09/11/19 2151 09/12/19 0631 09/12/19 1207  GLUCAP 138* 142* 145* 94 106*   Lipid Profile: No results for input(s): CHOL, HDL, LDLCALC, TRIG, CHOLHDL, LDLDIRECT in the last 72 hours. Thyroid Function Tests: No results for input(s): TSH, T4TOTAL, FREET4, T3FREE, THYROIDAB in the last 72 hours. Anemia Panel: No results for input(s): VITAMINB12, FOLATE, FERRITIN, TIBC, IRON, RETICCTPCT in the last 72 hours. Urine analysis:    Component Value Date/Time   COLORURINE YELLOW 09/09/2019 0628   APPEARANCEUR CLEAR 09/09/2019 0628   LABSPEC 1.010 09/09/2019 0628   PHURINE 6.0 09/09/2019 0628   GLUCOSEU NEGATIVE 09/09/2019 0628   HGBUR NEGATIVE 09/09/2019 09/11/2019  BILIRUBINUR NEGATIVE 09/09/2019 0628   KETONESUR NEGATIVE 09/09/2019 0628   PROTEINUR 100 (A) 09/09/2019 0628   UROBILINOGEN 1.0 10/24/2011 1742   NITRITE NEGATIVE 09/09/2019 0628   LEUKOCYTESUR NEGATIVE 09/09/2019 0628   Sepsis Labs: @LABRCNTIP (procalcitonin:4,lacticidven:4)  ) Recent Results (from the past 240 hour(s))  SARS Coronavirus 2 by RT PCR (hospital order, performed in [facility] hospital lab) Nasopharyngeal Nasopharyngeal Swab     Status: None   Collection Time: 09/09/19  3:15 AM   Specimen: Nasopharyngeal Swab  Result Value Ref Range Status   SARS Coronavirus 2 NEGATIVE NEGATIVE Final    Comment: (NOTE) SARS-CoV-2 target nucleic acids are NOT DETECTED.  The SARS-CoV-2 RNA is generally detectable in upper and lower respiratory specimens during the acute phase of infection. The lowest concentration of SARS-CoV-2 viral copies this assay can detect is 250 copies / mL. A negative result does not preclude SARS-CoV-2 infection and should not be used as the sole basis for treatment or other patient management decisions.  A negative result may occur with improper specimen collection / handling, submission of specimen other than nasopharyngeal swab, presence of viral mutation(s) within the areas targeted  by this assay, and inadequate number of viral copies (<250 copies / mL). A negative result must be combined with clinical observations, patient history, and epidemiological information.  Fact Sheet for Patients:   09/11/19  Fact Sheet for Healthcare Providers: BoilerBrush.com.cy  This test is not yet approved or  cleared by the https://pope.com/ FDA and has been authorized for detection and/or diagnosis of SARS-CoV-2 by FDA under an Emergency Use Authorization (EUA).  This EUA will remain in effect (meaning this test can be used) for the duration of the COVID-19 declaration under Section 564(b)(1) of the Act, 21 U.S.C. section 360bbb-3(b)(1), unless the authorization is terminated or revoked sooner.  Performed at [facility], [address], [place], [place]       Studies: No results found.  Scheduled Meds: . aspirin  81 mg Oral Daily  . atorvastatin  80 mg Oral Daily  . clopidogrel  75 mg Oral Daily  . enoxaparin (LOVENOX) injection  40 mg Subcutaneous Q24H  . ezetimibe  10 mg Oral Daily  . hydrALAZINE  25 mg Oral Q8H  . insulin aspart  0-9 Units Subcutaneous TID WC  . insulin glargine  22 Units Subcutaneous QHS  . iron polysaccharides  150 mg Oral Daily  . isosorbide mononitrate  60 mg Oral Daily  . metoprolol succinate  50 mg Oral Daily  . omega-3 acid ethyl esters  2,000 mg Oral Daily  . polyethylene glycol  17 g Oral Daily  . potassium chloride  10 mEq Oral Daily  . pramipexole  1.5 mg Oral QPM    Continuous Infusions:   LOS: 3 days     [id number], MD [facility]  If 7PM-7AM, please contact night-coverage [email or web address] 09/12/2019, 4:01 PM

## 2019-09-12 NOTE — Consult Note (Addendum)
WOC Nurse Consult Note: Reason for Consult: Consult requested for bilat legs. Pt has darker-colored intact skin and generalized edema, but no open wounds or drainage, few scattered areas of dry scabs. Appearance is consistent with venous stasis changes; no topical treatment is indicated at this time.  Please re-consult if further assistance is needed.  Thank-you,  Cammie Mcgee MSN, RN, CWOCN, [place], CNS [phone]

## 2019-09-12 NOTE — TOC Initial Note (Signed)
Transition of Care [facility]) - Initial/Assessment Note    Patient Details  Name: James [name] MRN: [id number] Date of Birth: Jul 23, 1945  Transition [facility]) CM/SW Contact:    James [name] Phone Number: 09/12/2019, 5:55 PM  Clinical Narrative:                 CSW met with patient bedside regarding PT recommendation for SNF placement at discharge. PTA, patient lived alone in a single family residence and used a cane to ambulate. Patient expressed understanding of PT recommendation and stated he would like to go home. CSW inquired about support in the home, patient stated his daughter James [name] lives near by and provided permission for her to be contacted. CSW will continue to follow.  Expected Discharge Plan: [place] Barriers to Discharge: Continued Medical Work up   Patient Goals and CMS Choice   CMS Medicare.gov Compare Post Acute Care list provided to:: Patient Choice offered to / list presented to : Patient  Expected Discharge Plan and Services Expected Discharge Plan: [place] In-house Referral: Clinical Social Work     Living arrangements for the past 2 months: [place]                                      Prior Living Arrangements/Services Living arrangements for the past 2 months: Single Family Home Lives with:: Self Patient language and need for interpreter reviewed:: Yes Do you feel safe going back to the place where you live?: Yes      Need for Family Participation in Patient Care: No ([name]) Care giver support system in place?: Yes ([name])   Criminal Activity/Legal Involvement Pertinent to Current Situation/Hospitalization: No - [name] as needed  Activities of Daily Living      Permission Sought/Granted Permission sought to share information with : Facility Sport and exercise psychologist, Family Supports Permission granted to share information with : Yes, Verbal Permission Granted  Share  Information with NAME: James [name]  Permission granted to share info w AGENCY: [place]  Permission granted to share info w Relationship: Daughter  Permission granted to share info w Contact Information: [phone]  Emotional Assessment     Affect (typically observed): Unable to Assess Orientation: : Oriented to Self, Oriented to Place, Oriented to  Time, Oriented to Situation Alcohol / Substance Use: Illicit Drugs Psych Involvement: No ([name])  Admission diagnosis:  CHF (congestive heart failure) (HCC) [I50.9] Acute CHF (congestive heart failure) (HCC) [I50.9] Acute on chronic combined systolic and diastolic congestive heart failure ([place]) [I50.43] Fall, initial encounter [W19.XXXA] Altered mental status, unspecified altered mental status type [R41.82] Patient Active Problem List   Diagnosis Date Noted  . Acute CHF (congestive heart failure) ([place]) 09/09/2019  . CHF (congestive heart failure) ([place]) 09/09/2019  . Cryptogenic stroke ([place]) 04/27/2019  . Left sided numbness 02/06/2019  . Acute lacunar infarction ([place]) 02/06/2019  . Obesity, Class II, BMI 35-39.9 02/06/2019  . Dyslipidemia   . Ischemic cardiomyopathy 10/12/2017  . Mild pulmonary hypertension ([place]) 10/12/2017  . S/P CABG (coronary artery bypass graft) 10/01/2017  . Status post coronary artery stent placement   . Left ventricular systolic dysfunction 95/63/8756  . Diverticulitis of sigmoid colon 07/24/2017  . Chronic systolic CHF (congestive heart failure) ([place]) 07/24/2017  . CKD (chronic kidney disease), stage III 07/24/2017  . Pleural thickening 07/24/2017  . Aortic atherosclerosis ([place]) 07/07/2017  .  Abnormal nuclear cardiac imaging test 11/14/2013  . Abnormal EKG 03/09/2013  . Dilated aortic root ([place])   . Edema 02/09/2013  . Hypertension 10/24/2011  . Coronary artery disease 10/24/2011  . Hyperlipidemia 10/24/2011  . DM (diabetes mellitus), type 2, uncontrolled with complications ([place]) 29/05/7531  . GERD  (gastroesophageal reflux disease) 10/24/2011   PCP:  James Bishop, DO Pharmacy:   [place], [address] [place] [address] Phone: [phone] Fax: [phone]  [facility] [place], [place] - [address] [place] [address] Phone: [phone] Fax: [phone]     Social Determinants of Health (SDOH) Interventions    Readmission Risk Interventions No flowsheet data found.

## 2019-09-13 DIAGNOSIS — I639 Cerebral infarction, unspecified: Secondary | ICD-10-CM

## 2019-09-13 DIAGNOSIS — I5041 Acute combined systolic (congestive) and diastolic (congestive) heart failure: Secondary | ICD-10-CM

## 2019-09-13 LAB — CBC WITH DIFFERENTIAL/PLATELET
Abs Immature Granulocytes: 0.05 10*3/uL (ref 0.00–0.07)
Basophils Absolute: 0.1 10*3/uL (ref 0.0–0.1)
Basophils Relative: 1 %
Eosinophils Absolute: 0.3 10*3/uL (ref 0.0–0.5)
Eosinophils Relative: 4 %
HCT: 45.9 % (ref 39.0–52.0)
Hemoglobin: 15.5 g/dL (ref 13.0–17.0)
Immature Granulocytes: 1 %
Lymphocytes Relative: 15 %
Lymphs Abs: 1.2 10*3/uL (ref 0.7–4.0)
MCH: 29.5 pg (ref 26.0–34.0)
MCHC: 33.8 g/dL (ref 30.0–36.0)
MCV: 87.3 fL (ref 80.0–100.0)
Monocytes Absolute: 1 10*3/uL (ref 0.1–1.0)
Monocytes Relative: 13 %
Neutro Abs: 5.2 10*3/uL (ref 1.7–7.7)
Neutrophils Relative %: 66 %
Platelets: 239 10*3/uL (ref 150–400)
RBC: 5.26 MIL/uL (ref 4.22–5.81)
RDW: 13 % (ref 11.5–15.5)
WBC: 7.9 10*3/uL (ref 4.0–10.5)
nRBC: 0 % (ref 0.0–0.2)

## 2019-09-13 LAB — GLUCOSE, CAPILLARY
Glucose-Capillary: 93 mg/dL (ref 70–99)
Glucose-Capillary: 162 mg/dL — ABNORMAL HIGH (ref 70–99)
Glucose-Capillary: 176 mg/dL — ABNORMAL HIGH (ref 70–99)

## 2019-09-13 LAB — BASIC METABOLIC PANEL
Anion gap: 10 (ref 5–15)
BUN: 30 mg/dL — ABNORMAL HIGH (ref 8–23)
CO2: 25 mmol/L (ref 22–32)
Calcium: 9 mg/dL (ref 8.9–10.3)
Chloride: 102 mmol/L (ref 98–111)
Creatinine, Ser: 1.79 mg/dL — ABNORMAL HIGH (ref 0.61–1.24)
GFR calc Af Amer: 42 mL/min — ABNORMAL LOW (ref >60)
GFR calc non Af Amer: 37 mL/min — ABNORMAL LOW (ref >60)
Glucose, Bld: 91 mg/dL (ref 70–99)
Potassium: 3.8 mmol/L (ref 3.5–5.1)
Sodium: 137 mmol/L (ref 135–145)

## 2019-09-13 MED ORDER — TORSEMIDE 20 MG PO TABS: 20.0000 mg | ORAL_TABLET | Freq: Every day | ORAL | 3 refills | Status: DC

## 2019-09-13 MED ORDER — TAMSULOSIN HCL 0.4 MG PO CAPS: 0.4000 mg | ORAL_CAPSULE | Freq: Every day | ORAL | 0 refills | Status: AC

## 2019-09-13 MED ORDER — METOPROLOL SUCCINATE ER 50 MG PO TB24: 50.0000 mg | ORAL_TABLET | Freq: Every day | ORAL | 0 refills | Status: DC

## 2019-09-13 MED ORDER — ATORVASTATIN CALCIUM 80 MG PO TABS: 80.0000 mg | ORAL_TABLET | Freq: Every day | ORAL | 0 refills | Status: DC

## 2019-09-13 NOTE — TOC Transition Note (Addendum)
Transition of Care [facility]) - CM/SW Discharge Note   Patient Details  Name: MARKAVIOUS MICCO MRN: [id number] Date of Birth: Aug 11, 1945  Transition of Care [facility]) CM/SW Contact:  Bess Kinds, RN Phone Number: [phone] 09/13/2019, 4:28 PM   Clinical Narrative:     Notified by MD of wanting to maximize home health services. Contacted [facility] to inquire if they can accept referral with increased disciplines. Referral accepted for PT, OT, SW, Aide - RN pending review of staffing capacity. MD notified.   Spoke with patient on his cell phone to discuss increase of disciplines. He provided permission to speak with his daughter. Attempted to contact his daughter on cell phone, however, call went straight to voicemail - no message left.   Final next level of care: Home w Home Health Services Barriers to Discharge: No Barriers Identified   Patient Goals and CMS Choice Patient states their goals for this hospitalization and ongoing recovery are:: go home today CMS Medicare.gov Compare Post Acute Care list provided to:: Patient Choice offered to / list presented to : Patient  Discharge Placement                       Discharge Plan and Services In-house Referral: Clinical Social Work              DME Arranged: Dan Humphreys rolling DME Agency: [employer] Date DME Agency Contacted: 09/13/19 Time DME Agency Contacted: 1247 Representative spoke with at DME Agency: Ian Malkin HH Arranged: PT, OT, Nurse's Aide, Social Work [employer] Agency: [facility] Date HH Agency Contacted: 09/13/19 Time HH Agency Contacted: 1628 Representative spoke with at [facility] Agency: Tiffany  Social Determinants of Health (SDOH) Interventions     Readmission Risk Interventions No flowsheet data found.

## 2019-09-13 NOTE — Progress Notes (Signed)
Discharge education and medication education given to patient with teach back. Education on increasing activity slowly, low sodium heart healthy diet, and when to call MD given.  All questions and concerns answered. Peripheral IV and telemetry leads removed. All patient belongings given to patient. Patient currently awaiting transportation home.

## 2019-09-13 NOTE — TOC Transition Note (Signed)
Transition of Care [facility]) - CM/SW Discharge Note   Patient Details  Name: OSBORN PULLIN MRN: [id number] Date of Birth: 06-14-1945  Transition of Care [facility]) CM/SW Contact:  Leone Haven, RN Phone Number: 09/13/2019, 12:48 PM   Clinical Narrative:    NCM offered choice for HHPT, he does not have a preference, NCM made referral to Tiffany with [facility], she is able to take referral . Soc will begin 24 to 48 hrs post dc.    Final next level of care: Home w Home Health Services Barriers to Discharge: No Barriers Identified   Patient Goals and CMS Choice Patient states their goals for this hospitalization and ongoing recovery are:: get better CMS Medicare.gov Compare Post Acute Care list provided to:: Patient Choice offered to / list presented to : Patient  Discharge Placement                       Discharge Plan and Services In-house Referral: Clinical Social Work              DME Arranged: Dan Humphreys rolling DME Agency: [employer] Date DME Agency Contacted: 09/13/19 Time DME Agency Contacted: 1247 Representative spoke with at DME Agency: Ian Malkin HH Arranged: PT HH Agency: [facility] Date HH Agency Contacted: 09/13/19 Time HH Agency Contacted: 1248 Representative spoke with at [facility] Agency: Tiffany  Social Determinants of Health (SDOH) Interventions     Readmission Risk Interventions No flowsheet data found.

## 2019-09-13 NOTE — Progress Notes (Signed)
Physical Therapy Treatment Patient Details Name: James [name] MRN: [id number] DOB: 01-Oct-1945 Today's Date: 09/13/2019    History of Present Illness 74 yo male with onset of fall at home was admitted, noted he slipped on water leak and hit his head.  Pt is in acute CHF episode, has pleural effusion, atelectasis, and note heart function is 40-45%.  PMHx:  venous stasis dermatitis, falls, stroke, HTN, CABG, DM, CKD, CHF    PT Comments    Pt received in recliner, agreeable to participation in therapy. His primary complaint was that his breakfast was cold this morning by the time he got up to the recliner to eat. He required min guard assist transfers and ambulation 100' with RW. He presents with unsteady gait, decreased safety awareness, and decreased activity tolerance. These deficits combined with pt living alone result in recommendation of ST SNF for further therapy. At this point, pt is declining SNF. If pt goes home, recommend maximizing home health services. 24-hour family assist would be ideal.    Follow Up Recommendations  SNF     Equipment Recommendations  Rolling walker with 5" wheels    Recommendations for Other Services       Precautions / Restrictions Precautions Precautions: Fall    Mobility  Bed Mobility               General bed mobility comments: Pt up in recliner upon arrival  Transfers Overall transfer level: Needs assistance Equipment used: Rolling walker (2 wheeled) Transfers: Sit to/from Stand Sit to Stand: Min guard         General transfer comment: increased time to power up and stabilize balance. Cues for hand placement and sequencing.  Ambulation/Gait Ambulation/Gait assistance: Min guard Gait Distance (Feet): 100 Feet Assistive device: Rolling walker (2 wheeled) Gait Pattern/deviations: Step-through pattern;Decreased stride length;Trunk flexed Gait velocity: decreased Gait velocity interpretation: <1.31 ft/sec, indicative of household  ambulator General Gait Details: Cues for RW management. Pt tends to get too close to front of RW. Unsteady requiring close min guard. Fatigues quickly.   Stairs             Wheelchair Mobility    Modified Rankin (Stroke Patients Only)       Balance Overall balance assessment: Needs assistance Sitting-balance support: No upper extremity supported;Feet supported Sitting balance-Leahy Scale: Good     Standing balance support: During functional activity;Bilateral upper extremity supported;No upper extremity supported Standing balance-Leahy Scale: Poor Standing balance comment: reliant on BUE support                            Cognition Arousal/Alertness: Awake/alert Behavior During Therapy: WFL for tasks assessed/performed Overall Cognitive Status: Within Functional Limits for tasks assessed                                        Exercises      General Comments        Pertinent Vitals/Pain Pain Assessment: No/denies pain    Home Living                      Prior Function            PT Goals (current goals can now be found in the care plan section) Acute Rehab PT Goals Patient Stated Goal: home Progress towards PT goals: Progressing toward goals  Frequency    Min 3X/week      PT Plan Current plan remains appropriate    Co-evaluation              AM-PAC PT "6 Clicks" Mobility   Outcome Measure  Help needed turning from your back to your side while in a flat bed without using bedrails?: A Little Help needed moving from lying on your back to sitting on the side of a flat bed without using bedrails?: A Little Help needed moving to and from a bed to a chair (including a wheelchair)?: A Little Help needed standing up from a chair using your arms (e.g., wheelchair or bedside chair)?: A Little Help needed to walk in hospital room?: A Little Help needed climbing 3-5 steps with a railing? : A Lot 6 Click  Score: 17    End of Session Equipment Utilized During Treatment: Gait belt Activity Tolerance: Patient tolerated treatment well Patient left: in chair;with call bell/phone within reach Nurse Communication: Mobility status PT Visit Diagnosis: Unsteadiness on feet (R26.81);Muscle weakness (generalized) (M62.81);History of falling (Z91.81)     Time: 5188-4166 PT Time Calculation (min) (ACUTE ONLY): 25 min  Charges:  $Gait Training: 23-37 mins                     Aida Raider, [place]  Office # [phone] Pager [phone]    Ilda Foil 09/13/2019, 12:30 PM

## 2019-09-13 NOTE — Discharge Summary (Addendum)
Discharge Summary  James [name] [facility] ZOX:[id number] DOB: 1945/11/25  PCP: Koren Shiver, DO  Admit date: 09/09/2019 Discharge date: 09/13/2019  Time spent: 40 mins  Recommendations for Outpatient Follow-up:  PCP in 1 week Cardiology as scheduled    Discharge Diagnoses:  Active Hospital Problems   Diagnosis Date Noted   Acute CHF (congestive heart failure) (HCC) 09/09/2019   CHF (congestive heart failure) (HCC) 09/09/2019   Cryptogenic stroke (HCC) 04/27/2019   S/P CABG (coronary artery bypass graft) 10/01/2017   CKD (chronic kidney disease), stage III 07/24/2017   DM (diabetes mellitus), type 2, uncontrolled with complications (HCC) 10/24/2011    Resolved Hospital Problems  No resolved problems to display.    Discharge Condition: Stable  Diet recommendation: Heart healthy/modified carb  Vitals:   09/13/19 1107 09/13/19 1429  BP: 108/73 (!) 105/61  Pulse: 77 77  Resp: 16   Temp: 98 F (36.7 C)   SpO2: 97%     History of present illness:  James [name] is a 74 y.o. male with history of CAD status post CABG, and cryptogenic stroke in December 2020, CHF last EF measured in December 2020 was 40 to 45%, hypertension, chronic kidney disease stage III, diabetes mellitus type 2 presents to the ER after patient had a fall. Patient states there was some water leaking from the roof and he slipped on it and fell onto the floor hitting his head. Did not lose consciousness. Patient also has stopped taking his torsemide because he has been urinating frequently and he was finding it difficult to walk and he has stopped taking about 3 months ago. Denies chest pain or shortness of breath. Recently followed up with primary care physician and since then he states he has not been eating well and has lost some weight. In the ER CT head and C-spine was unremarkable labs show creatinine 1.5 BNP of 175 CBC was unremarkable chest x-ray shows no pleural effusion. Patient on exam has mild elevated  JVD and bilateral lower extremity edema with venous stasis dermatitis. Patient admitted for fluid overload. Covid test was negative. EKG shows normal sinus rhythm.     Today, patient denied any other new complaints, continues to complain of his cold breakfast this morning.  Patient currently refusing SNF.  Discussed with daughter, also refusing as she lives 5 minutes away from patient and was also requesting home health.  Discussed discharge plan entirely with daughter and patient, all questions answered.  Plan to discharge patient on [facility] PT/OT/RN/aide/SW   Hospital Course:  Principal Problem:   Acute CHF (congestive heart failure) (HCC) Active Problems:   DM (diabetes mellitus), type 2, uncontrolled with complications (HCC)   CKD (chronic kidney disease), stage III   S/P CABG (coronary artery bypass graft)   Cryptogenic stroke (HCC)   CHF (congestive heart failure) (HCC)  Possible acute on chronic systolic HF Noted to have bilateral lower extremity edema BNP 175- falsely low in obese patients  Chest x-ray with small pleural effusion Repeats Echo done showed EF of 40 to 45%, unchanged from previous S/P IV Laisx, continue home torsemide (-4.9L) Follow-up with cardiology   CKD stage IIIa Creatinine at baseline, 1.3-1.5, slight bump in Cr Follow-up PCP   Hypertension Uncontrolled, poor medication compliance Continue Imdur, Toprol, torsemide   CAD status post CABG Currently chest pain-free Continue statins, Imdur, Toprol   History of CVA Continue statins, aspirin, Plavix   Diabetes mellitus type 2, uncontrolled with hyperglycemia Last A1c 12.9 on 01/2019, uncontrolled, repeat  A1c 7.2 Continue home SSI, Toujeo 22 units at bedtime, Metformin, Ozempic   Recurrent falls No focal neurologic deficit noted CT head/CT spine unremarkable Multifactorial PT/OT- rec SNF, patient refused [facility]imize home health   Bilateral venous stasis dermatitis Appears chronic    Obesity Lifestyle modification advised  Possible BPH Started Flomax Follow-up with PCP/urology             Malnutrition Type:      Malnutrition Characteristics:      Nutrition Interventions:      Estimated body mass index is 35.01 kg/m as calculated from the following:   Height as of this encounter:  (1.753 m).   Weight as of this encounter: 107.5 kg.    Procedures: None  Consultations: None  Discharge Exam: BP (!) 105/61 (BP Location: Left Arm)   Pulse 77   Temp 98 F (36.7 C) (Oral)   Resp 16   Ht  (1.753 m)   Wt (!) 107.5 kg   SpO2 97%   BMI 35.01 kg/m   General: NAD Cardiovascular: S1, S2 present Respiratory: Diminished breath sounds bilaterally   Discharge Instructions You were cared for by a hospitalist during your hospital stay. If you have any questions about your discharge medications or the care you received while you were in the hospital after you are discharged, you can call the unit and asked to speak with the hospitalist on call if the hospitalist that took care of you is not available. Once you are discharged, your primary care physician will handle any further medical issues. Please note that NO REFILLS for any discharge medications will be authorized once you are discharged, as it is imperative that you return to your primary care physician (or establish a relationship with a primary care physician if you do not have one) for your aftercare needs so that they can reassess your need for medications and monitor your lab values.  Discharge Instructions     Diet - low sodium heart healthy   Complete by: As directed    Discharge wound care:   Complete by: As directed    Continue dressing   Increase activity slowly   Complete by: As directed       Allergies as of 09/13/2019       Reactions   Lisinopril Cough        Medication List     STOP taking these medications    ibuprofen 200 MG tablet Commonly known as:  ADVIL   insulin lispro protamine-lispro (75-25) 100 UNIT/ML Susp injection Commonly known as: HUMALOG 75/25 MIX   ticagrelor 90 MG Tabs tablet Commonly known as: BRILINTA       TAKE these medications    acetaminophen 500 MG tablet Commonly known as: TYLENOL Take 1,000 mg by mouth every 6 (six) hours as needed for headache.   aspirin 81 MG chewable tablet Chew 81 mg by mouth daily.   atorvastatin 80 MG tablet Commonly known as: LIPITOR Take 1 tablet (80 mg total) by mouth daily. Start taking on: September 14, 2019   cholecalciferol 1000 units tablet Commonly known as: VITAMIN D Take 1,000 Units by mouth daily.   clopidogrel 75 MG tablet Commonly known as: PLAVIX Take 75 mg by mouth daily.   ezetimibe 10 MG tablet Commonly known as: ZETIA TAKE 1 TABLET (10 MG TOTAL) BY MOUTH DAILY. PLEASE MAKE OVERDUE APPT WITH DR. Mayford Knife BEFORE ANYMORE REFILLS. What changed: See the new instructions.   fluticasone 50 MCG/ACT nasal spray  Commonly known as: FLONASE Place 1 spray into both nostrils daily.   insulin aspart 100 UNIT/ML injection Commonly known as: novoLOG Inject 8 Units into the skin See admin instructions. Three times a day with meals and as needed using sliding scale.   iron polysaccharides 150 MG capsule Commonly known as: NIFEREX Take 1 capsule (150 mg total) by mouth daily.   isosorbide mononitrate 60 MG 24 hr tablet Commonly known as: IMDUR TAKE 1 TABLET (60 MG TOTAL) BY MOUTH DAILY. What changed: See the new instructions.   metFORMIN 1000 MG tablet Commonly known as: GLUCOPHAGE Take 1,000 mg by mouth daily with breakfast.   metoprolol succinate 50 MG 24 hr tablet Commonly known as: TOPROL-XL Take 1 tablet (50 mg total) by mouth daily. Take with or immediately following a meal. Start taking on: September 14, 2019   multivitamin with minerals Tabs tablet Take 1 tablet by mouth daily.   nitroGLYCERIN 0.4 MG SL tablet Commonly known as: Nitrostat Place 1 tablet  (0.4 mg total) under the tongue every 5 (five) minutes as needed for chest pain.   Omega-3 1000 MG Caps Take 2,000 mg by mouth daily.   Ozempic (0.25 or 0.5 MG/DOSE) 2 MG/1.5ML Sopn Generic drug: Semaglutide(0.25 or 0.5MG /DOS) Inject 1 mg into the skin every Sunday.   polyethylene glycol 17 g packet Commonly known as: MiraLax Take 17 g by mouth daily.   potassium chloride 10 MEQ tablet Commonly known as: KLOR-CON TAKE 1 TABLET (10 MEQ TOTAL) BY MOUTH SEE ADMIN INSTRUCTIONS. PLEASE MAKE APT FOR FUTURE REFILLS 1ST ATTEMPT [phone]. What changed:  when to take this additional instructions   pramipexole 0.75 MG tablet Commonly known as: MIRAPEX Take 1.5 mg by mouth every evening.   tamsulosin 0.4 MG Caps capsule Commonly known as: FLOMAX Take 1 capsule (0.4 mg total) by mouth daily after breakfast.   torsemide 20 MG tablet Commonly known as: DEMADEX Take 1 tablet (20 mg total) by mouth daily. What changed:  how much to take how to take this when to take this additional instructions   Toujeo SoloStar 300 UNIT/ML Solostar Pen Generic drug: insulin glargine (1 Unit Dial) Inject 22 Units into the skin at bedtime.               Durable Medical Equipment  (From admission, onward)           Start     Ordered   09/13/19 1247  For home use only DME Walker  Once       Question:  Patient needs a walker to treat with the following condition  Answer:  Weakness   09/13/19 1246              Discharge Care Instructions  (From admission, onward)           Start     Ordered   09/13/19 0000  Discharge wound care:       Comments: Continue dressing   09/13/19 1633           Allergies  Allergen Reactions   Lisinopril Cough    Follow-up Information     [facility] Follow up.   Specialty: Home Health Services Why: HHPT Contact information: [address] [place] [address] [phone]         [facility] Follow up.    Why: rolling walker Contact information: [address] [place] [address] [phone]  Masneri, Wille CelesteShannon M, DO. Schedule an appointment as soon as possible for a visit in 1 week(s).   Specialty: Family Medicine Contact information: [address] [place] [phone]         Quintella Reicherturner, Traci R, MD .   Specialty: Cardiology Contact information: [phone] [address] [place] [address] [phone]         [facility] Follow up.   Specialty: Neurology Why: Call for a neurology appointment Contact information: [address] [place] [id number] [phone]                 The results of significant diagnostics from this hospitalization (including imaging, microbiology, ancillary and laboratory) are listed below for reference.    Significant Diagnostic Studies: DG Chest 2 View  Result Date: 09/09/2019 CLINICAL DATA:  Shortness of breath EXAM: CHEST - 2 VIEW COMPARISON:  02/06/2019 FINDINGS: Small left pleural effusion with basilar atelectasis, unchanged. Unchanged cardiomegaly status post median sternotomy. Right lung is clear. IMPRESSION: Unchanged small left pleural effusion with basilar atelectasis. Electronically Signed   By: Deatra RobinsonKevin  Herman M.D.   On: 09/09/2019 02:25   CT HEAD WO CONTRAST  Result Date: 09/09/2019 CLINICAL DATA:  Head trauma while on Plavix.  Multiple recent falls. EXAM: CT HEAD WITHOUT CONTRAST CT CERVICAL SPINE WITHOUT CONTRAST TECHNIQUE: Multidetector CT imaging of the head and cervical spine was performed following the standard protocol without intravenous contrast. Multiplanar CT image reconstructions of the cervical spine were also generated. COMPARISON:  02/06/2019 FINDINGS: CT HEAD FINDINGS Brain: There is periventricular hypoattenuation compatible with chronic microvascular disease. There are multiple old small vessel infarcts of the centrum semiovale,  basal ganglia and left cerebellum. There is no acute hemorrhage. No midline shift or other mass effect. CSF spaces are normal for age. No acute cortical infarct. Vascular: No hyperdense vessel or unexpected calcification. Skull: Normal. Negative for fracture or focal lesion. Sinuses/Orbits: No acute finding. Other: None. CT CERVICAL SPINE FINDINGS Alignment: Normal. Skull base and vertebrae: No acute fracture. No primary bone lesion or focal pathologic process. Soft tissues and spinal canal: No prevertebral fluid or swelling. No visible canal hematoma. Disc levels: No bony spinal canal stenosis. Disc spaces are preserved. Upper chest: Negative. Other: None IMPRESSION: 1. No acute intracranial abnormality. 2. No acute fracture or static subluxation of the cervical spine. 3. Multiple old small vessel infarcts and findings of chronic microvascular ischemia. Electronically Signed   By: Deatra RobinsonKevin  Herman M.D.   On: 09/09/2019 02:24   CT Cervical Spine Wo Contrast  Result Date: 09/09/2019 CLINICAL DATA:  Head trauma while on Plavix.  Multiple recent falls. EXAM: CT HEAD WITHOUT CONTRAST CT CERVICAL SPINE WITHOUT CONTRAST TECHNIQUE: Multidetector CT imaging of the head and cervical spine was performed following the standard protocol without intravenous contrast. Multiplanar CT image reconstructions of the cervical spine were also generated. COMPARISON:  02/06/2019 FINDINGS: CT HEAD FINDINGS Brain: There is periventricular hypoattenuation compatible with chronic microvascular disease. There are multiple old small vessel infarcts of the centrum semiovale, basal ganglia and left cerebellum. There is no acute hemorrhage. No midline shift or other mass effect. CSF spaces are normal for age. No acute cortical infarct. Vascular: No hyperdense vessel or unexpected calcification. Skull: Normal. Negative for fracture or focal lesion. Sinuses/Orbits: No acute finding. Other: None. CT CERVICAL SPINE FINDINGS Alignment: Normal. Skull  base and vertebrae: No acute fracture. No primary bone lesion or focal pathologic process. Soft tissues and  spinal canal: No prevertebral fluid or swelling. No visible canal hematoma. Disc levels: No bony spinal canal stenosis. Disc spaces are preserved. Upper chest: Negative. Other: None IMPRESSION: 1. No acute intracranial abnormality. 2. No acute fracture or static subluxation of the cervical spine. 3. Multiple old small vessel infarcts and findings of chronic microvascular ischemia. Electronically Signed   By: Deatra Robinson M.D.   On: 09/09/2019 02:24   CUP PACEART REMOTE DEVICE CHECK  Result Date: 08/15/2019 Carelink summary report received. Battery status OK. Normal device function. No new symptom episodes, tachy episodes, brady, or pause episodes. No new AF episodes. Monthly summary reports and ROV/PRN  ECHOCARDIOGRAM LIMITED  Result Date: 09/11/2019    ECHOCARDIOGRAM LIMITED REPORT   Patient Name:   James [name] Date of Exam: 09/11/2019 Medical Rec #:  [id number]     Height:       69.0 in Accession #:    [id number]    Weight:       240.9 lb Date of Birth:  [birth year]     BSA:          2.236 m Patient Age:    74 years      BP:           155/72 mmHg Patient Gender: M             HR:           63 bpm. Exam Location:  Inpatient Procedure: Limited Echo, Limited Color Doppler and Cardiac Doppler Indications:    I50.23 Acute on chronic systolic (congestive) heart failure  History:        Patient has prior history of Echocardiogram examinations, most                 recent 02/06/2019. Arrythmias:Atrial Flutter. CKD. Dilated                 Aortic Root. Ascending Aortic Aneurysm.  Sonographer:    Tiffany Dance Referring Phys: [id number] Warrick Parisian J Icker Swigert IMPRESSIONS  1. Left ventricular ejection fraction, by estimation, is 45 to 50%. The left ventricle has mildly decreased function. The left ventricle demonstrates global hypokinesis. There is mild left ventricular hypertrophy. There is moderate hypokinesis of  the left ventricular, entire septal wall.  2. Right ventricular systolic function is mildly reduced. The right ventricular size is normal.  3. Left atrial size was mildly dilated.  4. The aortic valve was not well visualized. Aortic valve regurgitation is not visualized.  5. The inferior vena cava is normal in size with greater than 50% respiratory variability, suggesting right atrial pressure of 3 mmHg.6.The ascending aorta meassures up to 3.5 cm at the most distally measured point. Comparison(s): Changes from prior study are noted. 02/06/19: LVEF 40-45%, global hypokinesis. FINDINGS  Left Ventricle: Left ventricular ejection fraction, by estimation, is 45 to 50%. The left ventricle has mildly decreased function. The left ventricle demonstrates global hypokinesis. Moderate hypokinesis of the left ventricular, entire septal wall. There is mild left ventricular hypertrophy. Right Ventricle: The right ventricular size is normal. No increase in right ventricular wall thickness. Right ventricular systolic function is mildly reduced. Left Atrium: Left atrial size was mildly dilated. Right Atrium: Right atrial size was normal in size. Aortic Valve: The aortic valve was not well visualized. Aortic valve regurgitation is not visualized. Pulmonic Valve: The pulmonic valve was grossly normal. Pulmonic valve regurgitation is trivial. Aorta: The aortic root and ascending aorta are structurally normal, with no evidence of dilitation. Venous: The  inferior vena cava is normal in size with greater than 50% respiratory variability, suggesting right atrial pressure of 3 mmHg. IAS/Shunts: The interatrial septum was not well visualized. LEFT VENTRICLE PLAX 2D LVIDd:         5.20 cm LVIDs:         3.70 cm LV PW:         1.10 cm LV IVS:        1.30 cm LVOT diam:     2.00 cm LV SV:         51 LV SV Index:   23 LVOT Area:     3.14 cm  RIGHT VENTRICLE            IVC RV Basal diam:  2.60 cm    IVC diam: 1.80 cm RV S prime:     7.07 cm/s  TAPSE (M-mode): 1.2 cm LEFT ATRIUM             Index       RIGHT ATRIUM           Index LA diam:        5.50 cm 2.46 cm/m  RA Area:     19.10 cm LA Vol (A2C):   60.7 ml 27.15 ml/m RA Volume:   49.10 ml  21.96 ml/m LA Vol (A4C):   70.7 ml 31.62 ml/m LA Biplane Vol: 66.1 ml 29.56 ml/m  AORTIC VALVE LVOT Vmax:   77.95 cm/s LVOT Vmean:  54.200 cm/s LVOT VTI:    0.164 m  AORTA Ao Root diam: 3.50 cm Ao Asc diam:  3.50 cm MITRAL VALVE MV Area (PHT): 2.99 cm    SHUNTS MV Decel Time: 254 msec    Systemic VTI:  0.16 m MV E velocity: 69.00 cm/s  Systemic Diam: 2.00 cm MV A velocity: 73.30 cm/s MV E/A ratio:  0.94 Zoila Shutter MD Electronically signed by Zoila Shutter MD Signature Date/Time: 09/11/2019/1:00:37 PM    Final     Microbiology: Recent Results (from the past 240 hour(s))  SARS Coronavirus 2 by RT PCR (hospital order, performed in [facility] hospital lab) Nasopharyngeal Nasopharyngeal Swab     Status: None   Collection Time: 09/09/19  3:15 AM   Specimen: Nasopharyngeal Swab  Result Value Ref Range Status   SARS Coronavirus 2 NEGATIVE NEGATIVE Final    Comment: (NOTE) SARS-CoV-2 target nucleic acids are NOT DETECTED.  The SARS-CoV-2 RNA is generally detectable in upper and lower respiratory specimens during the acute phase of infection. The lowest concentration of SARS-CoV-2 viral copies this assay can detect is 250 copies / mL. A negative result does not preclude SARS-CoV-2 infection and should not be used as the sole basis for treatment or other patient management decisions.  A negative result may occur with improper specimen collection / handling, submission of specimen other than nasopharyngeal swab, presence of viral mutation(s) within the areas targeted by this assay, and inadequate number of viral copies (<250 copies / mL). A negative result must be combined with clinical observations, patient history, and epidemiological information.  Fact Sheet for Patients:    BoilerBrush.com.cy  Fact Sheet for Healthcare Providers: https://pope.com/  This test is not yet approved or  cleared by the [place] FDA and has been authorized for detection and/or diagnosis of SARS-CoV-2 by FDA under an Emergency Use Authorization (EUA).  This EUA will remain in effect (meaning this test can be used) for the duration of the COVID-19 declaration under Section 564(b)(1) of the Act,  21 U.S.C. section 360bbb-3(b)(1), unless the authorization is terminated or revoked sooner.  Performed at [facility], [address] [place], [place] [address]      Labs: Basic Metabolic Panel: Recent Labs  Lab 09/09/19 0728 09/10/19 0734 09/11/19 0617 09/12/19 0605 09/13/19 0446  NA 137 139 138 138 137  K 3.6 3.5 3.5 3.9 3.8  CL 103 102 104 101 102  CO2 24 25 25 28 25   GLUCOSE 118* 90 83 93 91  BUN 19 20 21  25* 30*  CREATININE 1.48* 1.69* 1.60* 1.91* 1.79*  CALCIUM 9.1 9.0 8.7* 8.8* 9.0  MG 2.0  --   --   --   --    Liver Function Tests: Recent Labs  Lab 09/09/19 0126 09/09/19 0728  AST 21 18  ALT 16 16  ALKPHOS 72 67  BILITOT 0.9 1.1  PROT 7.0 6.9  ALBUMIN 3.9 3.7   No results for input(s): LIPASE, AMYLASE in the last 168 hours. No results for input(s): AMMONIA in the last 168 hours. CBC: Recent Labs  Lab 09/09/19 0728 09/10/19 0734 09/11/19 0617 09/12/19 0605 09/13/19 0446  WBC 6.8 9.9 9.2 8.1 7.9  NEUTROABS 4.6 7.2 6.5 5.6 5.2  HGB 14.8 14.2 14.0 14.4 15.5  HCT 45.5 42.5 42.3 43.5 45.9  MCV 88.0 86.9 87.9 87.7 87.3  PLT 209 213 211 219 239   Cardiac Enzymes: No results for input(s): CKTOTAL, CKMB, CKMBINDEX, TROPONINI in the last 168 hours. BNP: BNP (last 3 results) Recent Labs    12/20/18 1710 02/05/19 2310 09/09/19 0138  BNP 246.5* 238.2* 175.1*    ProBNP (last 3 results) No results for input(s): PROBNP in the last 8760 hours.  CBG: Recent Labs  Lab 09/12/19 1636  09/12/19 2125 09/13/19 0601 09/13/19 1108 09/13/19 1557  GLUCAP 116* 123* 93 162* 176*       Signed:  09/15/19, MD [facility] 09/13/2019, 4:36 PM

## 2019-09-14 DIAGNOSIS — J189 Pneumonia, unspecified organism: Secondary | ICD-10-CM | POA: Diagnosis not present

## 2019-09-14 DIAGNOSIS — Y95 Nosocomial condition: Secondary | ICD-10-CM | POA: Diagnosis not present

## 2019-09-14 DIAGNOSIS — Z9181 History of falling: Secondary | ICD-10-CM | POA: Diagnosis not present

## 2019-09-14 DIAGNOSIS — G9341 Metabolic encephalopathy: Secondary | ICD-10-CM | POA: Diagnosis not present

## 2019-09-14 DIAGNOSIS — J90 Pleural effusion, not elsewhere classified: Secondary | ICD-10-CM | POA: Diagnosis not present

## 2019-09-14 DIAGNOSIS — Z20822 Contact with and (suspected) exposure to covid-19: Secondary | ICD-10-CM | POA: Diagnosis not present

## 2019-09-14 DIAGNOSIS — E1122 Type 2 diabetes mellitus with diabetic chronic kidney disease: Secondary | ICD-10-CM | POA: Diagnosis not present

## 2019-09-14 DIAGNOSIS — N390 Urinary tract infection, site not specified: Secondary | ICD-10-CM | POA: Diagnosis not present

## 2019-09-14 DIAGNOSIS — N3000 Acute cystitis without hematuria: Secondary | ICD-10-CM | POA: Diagnosis not present

## 2019-09-14 DIAGNOSIS — Z955 Presence of coronary angioplasty implant and graft: Secondary | ICD-10-CM | POA: Diagnosis not present

## 2019-09-14 DIAGNOSIS — Z951 Presence of aortocoronary bypass graft: Secondary | ICD-10-CM | POA: Diagnosis not present

## 2019-09-14 DIAGNOSIS — N179 Acute kidney failure, unspecified: Secondary | ICD-10-CM | POA: Diagnosis not present

## 2019-09-14 DIAGNOSIS — Z8673 Personal history of transient ischemic attack (TIA), and cerebral infarction without residual deficits: Secondary | ICD-10-CM | POA: Diagnosis not present

## 2019-09-14 DIAGNOSIS — J168 Pneumonia due to other specified infectious organisms: Secondary | ICD-10-CM | POA: Diagnosis not present

## 2019-09-14 DIAGNOSIS — I517 Cardiomegaly: Secondary | ICD-10-CM | POA: Diagnosis not present

## 2019-09-14 DIAGNOSIS — W19XXXA Unspecified fall, initial encounter: Secondary | ICD-10-CM | POA: Diagnosis not present

## 2019-09-14 DIAGNOSIS — I2510 Atherosclerotic heart disease of native coronary artery without angina pectoris: Secondary | ICD-10-CM | POA: Diagnosis not present

## 2019-09-14 DIAGNOSIS — I959 Hypotension, unspecified: Secondary | ICD-10-CM | POA: Diagnosis not present

## 2019-09-14 DIAGNOSIS — E119 Type 2 diabetes mellitus without complications: Secondary | ICD-10-CM | POA: Diagnosis not present

## 2019-09-14 DIAGNOSIS — R918 Other nonspecific abnormal finding of lung field: Secondary | ICD-10-CM | POA: Diagnosis not present

## 2019-09-14 DIAGNOSIS — R531 Weakness: Secondary | ICD-10-CM | POA: Diagnosis not present

## 2019-09-15 DIAGNOSIS — N401 Enlarged prostate with lower urinary tract symptoms: Secondary | ICD-10-CM | POA: Diagnosis not present

## 2019-09-15 DIAGNOSIS — N3000 Acute cystitis without hematuria: Secondary | ICD-10-CM | POA: Diagnosis not present

## 2019-09-15 DIAGNOSIS — I5022 Chronic systolic (congestive) heart failure: Secondary | ICD-10-CM | POA: Diagnosis not present

## 2019-09-15 DIAGNOSIS — R531 Weakness: Secondary | ICD-10-CM | POA: Diagnosis not present

## 2019-09-15 DIAGNOSIS — I10 Essential (primary) hypertension: Secondary | ICD-10-CM | POA: Diagnosis not present

## 2019-09-15 DIAGNOSIS — G9341 Metabolic encephalopathy: Secondary | ICD-10-CM | POA: Diagnosis not present

## 2019-09-15 DIAGNOSIS — N179 Acute kidney failure, unspecified: Secondary | ICD-10-CM | POA: Diagnosis not present

## 2019-09-15 DIAGNOSIS — I2510 Atherosclerotic heart disease of native coronary artery without angina pectoris: Secondary | ICD-10-CM | POA: Diagnosis not present

## 2019-09-15 DIAGNOSIS — Z8673 Personal history of transient ischemic attack (TIA), and cerebral infarction without residual deficits: Secondary | ICD-10-CM | POA: Diagnosis not present

## 2019-09-16 DIAGNOSIS — N3000 Acute cystitis without hematuria: Secondary | ICD-10-CM | POA: Diagnosis not present

## 2019-09-16 DIAGNOSIS — G9341 Metabolic encephalopathy: Secondary | ICD-10-CM | POA: Diagnosis not present

## 2019-09-16 DIAGNOSIS — N401 Enlarged prostate with lower urinary tract symptoms: Secondary | ICD-10-CM | POA: Diagnosis not present

## 2019-09-16 DIAGNOSIS — I10 Essential (primary) hypertension: Secondary | ICD-10-CM | POA: Diagnosis not present

## 2019-09-16 DIAGNOSIS — R531 Weakness: Secondary | ICD-10-CM | POA: Diagnosis not present

## 2019-09-16 DIAGNOSIS — I2510 Atherosclerotic heart disease of native coronary artery without angina pectoris: Secondary | ICD-10-CM | POA: Diagnosis not present

## 2019-09-16 DIAGNOSIS — I5022 Chronic systolic (congestive) heart failure: Secondary | ICD-10-CM | POA: Diagnosis not present

## 2019-09-16 DIAGNOSIS — N179 Acute kidney failure, unspecified: Secondary | ICD-10-CM | POA: Diagnosis not present

## 2019-09-16 DIAGNOSIS — Z8673 Personal history of transient ischemic attack (TIA), and cerebral infarction without residual deficits: Secondary | ICD-10-CM | POA: Diagnosis not present

## 2019-09-17 DIAGNOSIS — E782 Mixed hyperlipidemia: Secondary | ICD-10-CM | POA: Diagnosis not present

## 2019-09-17 DIAGNOSIS — Z20822 Contact with and (suspected) exposure to covid-19: Secondary | ICD-10-CM | POA: Diagnosis not present

## 2019-09-17 DIAGNOSIS — I25119 Atherosclerotic heart disease of native coronary artery with unspecified angina pectoris: Secondary | ICD-10-CM | POA: Diagnosis not present

## 2019-09-17 DIAGNOSIS — I5021 Acute systolic (congestive) heart failure: Secondary | ICD-10-CM | POA: Diagnosis not present

## 2019-09-17 DIAGNOSIS — R531 Weakness: Secondary | ICD-10-CM | POA: Diagnosis not present

## 2019-09-17 DIAGNOSIS — I25118 Atherosclerotic heart disease of native coronary artery with other forms of angina pectoris: Secondary | ICD-10-CM | POA: Diagnosis not present

## 2019-09-17 DIAGNOSIS — G9341 Metabolic encephalopathy: Secondary | ICD-10-CM | POA: Diagnosis not present

## 2019-09-17 DIAGNOSIS — I7389 Other specified peripheral vascular diseases: Secondary | ICD-10-CM | POA: Diagnosis not present

## 2019-09-17 DIAGNOSIS — I10 Essential (primary) hypertension: Secondary | ICD-10-CM | POA: Diagnosis not present

## 2019-09-17 DIAGNOSIS — N179 Acute kidney failure, unspecified: Secondary | ICD-10-CM | POA: Diagnosis not present

## 2019-09-17 DIAGNOSIS — E1122 Type 2 diabetes mellitus with diabetic chronic kidney disease: Secondary | ICD-10-CM | POA: Diagnosis not present

## 2019-09-17 DIAGNOSIS — I5022 Chronic systolic (congestive) heart failure: Secondary | ICD-10-CM | POA: Diagnosis not present

## 2019-09-17 DIAGNOSIS — Z8673 Personal history of transient ischemic attack (TIA), and cerebral infarction without residual deficits: Secondary | ICD-10-CM | POA: Diagnosis not present

## 2019-09-17 DIAGNOSIS — Z20828 Contact with and (suspected) exposure to other viral communicable diseases: Secondary | ICD-10-CM | POA: Diagnosis not present

## 2019-09-17 DIAGNOSIS — I739 Peripheral vascular disease, unspecified: Secondary | ICD-10-CM | POA: Diagnosis not present

## 2019-09-17 DIAGNOSIS — N390 Urinary tract infection, site not specified: Secondary | ICD-10-CM | POA: Diagnosis not present

## 2019-09-17 DIAGNOSIS — N172 Acute kidney failure with medullary necrosis: Secondary | ICD-10-CM | POA: Diagnosis not present

## 2019-09-17 DIAGNOSIS — N3000 Acute cystitis without hematuria: Secondary | ICD-10-CM | POA: Diagnosis not present

## 2019-09-17 DIAGNOSIS — Z951 Presence of aortocoronary bypass graft: Secondary | ICD-10-CM | POA: Diagnosis not present

## 2019-09-17 DIAGNOSIS — H35033 Hypertensive retinopathy, bilateral: Secondary | ICD-10-CM | POA: Diagnosis not present

## 2019-09-17 DIAGNOSIS — Z955 Presence of coronary angioplasty implant and graft: Secondary | ICD-10-CM | POA: Diagnosis not present

## 2019-09-17 DIAGNOSIS — E7800 Pure hypercholesterolemia, unspecified: Secondary | ICD-10-CM | POA: Diagnosis not present

## 2019-09-17 DIAGNOSIS — N401 Enlarged prostate with lower urinary tract symptoms: Secondary | ICD-10-CM | POA: Diagnosis not present

## 2019-09-17 DIAGNOSIS — I2510 Atherosclerotic heart disease of native coronary artery without angina pectoris: Secondary | ICD-10-CM | POA: Diagnosis not present

## 2019-09-17 DIAGNOSIS — Z9181 History of falling: Secondary | ICD-10-CM | POA: Diagnosis not present

## 2019-09-17 DIAGNOSIS — N3001 Acute cystitis with hematuria: Secondary | ICD-10-CM | POA: Diagnosis not present

## 2019-09-17 DIAGNOSIS — I639 Cerebral infarction, unspecified: Secondary | ICD-10-CM | POA: Diagnosis not present

## 2019-09-17 DIAGNOSIS — E1121 Type 2 diabetes mellitus with diabetic nephropathy: Secondary | ICD-10-CM | POA: Diagnosis not present

## 2019-09-17 DIAGNOSIS — N1831 Chronic kidney disease, stage 3a: Secondary | ICD-10-CM | POA: Diagnosis not present

## 2019-09-17 DIAGNOSIS — E119 Type 2 diabetes mellitus without complications: Secondary | ICD-10-CM | POA: Diagnosis not present

## 2019-09-17 DIAGNOSIS — I130 Hypertensive heart and chronic kidney disease with heart failure and stage 1 through stage 4 chronic kidney disease, or unspecified chronic kidney disease: Secondary | ICD-10-CM | POA: Diagnosis not present

## 2019-09-19 ENCOUNTER — Other Ambulatory Visit: Payer: Self-pay

## 2019-09-19 ENCOUNTER — Ambulatory Visit (INDEPENDENT_AMBULATORY_CARE_PROVIDER_SITE_OTHER): Payer: Medicare HMO | Admitting: *Deleted

## 2019-09-19 DIAGNOSIS — I639 Cerebral infarction, unspecified: Secondary | ICD-10-CM | POA: Diagnosis not present

## 2019-09-19 NOTE — Patient Outreach (Signed)
[facility]) Care Management  09/19/2019  James [name] [facility] 1945-12-24 [id number]   EMMI- General Discharge RED ON EMMI ALERT Day # 1 Date: 09/16/19 Red Alert Reason:  Know who to call about changes in condition? No  Scheduled follow-up? No  Other questions/problems? Yes    Outreach attempt:Patient noted to be at [facility]. Telephone call to facility. Spoke with April who confirms patient inpatient status at facility.   Plan: RN CM will close case.  [name], RN, MSN [facility] Care Management Care Management Coordinator Direct Line [phone] Toll Free: [phone]  Fax: [phone]

## 2019-09-20 DIAGNOSIS — N390 Urinary tract infection, site not specified: Secondary | ICD-10-CM | POA: Diagnosis not present

## 2019-09-20 DIAGNOSIS — I5022 Chronic systolic (congestive) heart failure: Secondary | ICD-10-CM | POA: Diagnosis not present

## 2019-09-20 DIAGNOSIS — I25119 Atherosclerotic heart disease of native coronary artery with unspecified angina pectoris: Secondary | ICD-10-CM | POA: Diagnosis not present

## 2019-09-20 DIAGNOSIS — N1831 Chronic kidney disease, stage 3a: Secondary | ICD-10-CM | POA: Diagnosis not present

## 2019-09-20 DIAGNOSIS — I7389 Other specified peripheral vascular diseases: Secondary | ICD-10-CM | POA: Diagnosis not present

## 2019-09-20 DIAGNOSIS — I10 Essential (primary) hypertension: Secondary | ICD-10-CM | POA: Diagnosis not present

## 2019-09-20 DIAGNOSIS — E1122 Type 2 diabetes mellitus with diabetic chronic kidney disease: Secondary | ICD-10-CM | POA: Diagnosis not present

## 2019-09-20 LAB — CUP PACEART REMOTE DEVICE CHECK
Date Time Interrogation Session: 20210730230357
Implantable Pulse Generator Implant Date: 20210310

## 2019-09-20 NOTE — Progress Notes (Signed)
Carelink Summary Report / Loop Recorder 

## 2019-09-23 DIAGNOSIS — N1831 Chronic kidney disease, stage 3a: Secondary | ICD-10-CM | POA: Diagnosis not present

## 2019-09-23 DIAGNOSIS — I5022 Chronic systolic (congestive) heart failure: Secondary | ICD-10-CM | POA: Diagnosis not present

## 2019-09-23 DIAGNOSIS — E1122 Type 2 diabetes mellitus with diabetic chronic kidney disease: Secondary | ICD-10-CM | POA: Diagnosis not present

## 2019-10-04 DIAGNOSIS — I5022 Chronic systolic (congestive) heart failure: Secondary | ICD-10-CM | POA: Diagnosis not present

## 2019-10-04 DIAGNOSIS — N1831 Chronic kidney disease, stage 3a: Secondary | ICD-10-CM | POA: Diagnosis not present

## 2019-10-06 DIAGNOSIS — N1831 Chronic kidney disease, stage 3a: Secondary | ICD-10-CM | POA: Diagnosis not present

## 2019-10-06 DIAGNOSIS — R351 Nocturia: Secondary | ICD-10-CM | POA: Diagnosis not present

## 2019-10-06 DIAGNOSIS — N401 Enlarged prostate with lower urinary tract symptoms: Secondary | ICD-10-CM | POA: Diagnosis not present

## 2019-10-06 DIAGNOSIS — I110 Hypertensive heart disease with heart failure: Secondary | ICD-10-CM | POA: Diagnosis not present

## 2019-10-06 DIAGNOSIS — E1151 Type 2 diabetes mellitus with diabetic peripheral angiopathy without gangrene: Secondary | ICD-10-CM | POA: Diagnosis not present

## 2019-10-06 DIAGNOSIS — E1122 Type 2 diabetes mellitus with diabetic chronic kidney disease: Secondary | ICD-10-CM | POA: Diagnosis not present

## 2019-10-06 DIAGNOSIS — I5022 Chronic systolic (congestive) heart failure: Secondary | ICD-10-CM | POA: Diagnosis not present

## 2019-10-06 DIAGNOSIS — I25119 Atherosclerotic heart disease of native coronary artery with unspecified angina pectoris: Secondary | ICD-10-CM | POA: Diagnosis not present

## 2019-10-06 DIAGNOSIS — Z951 Presence of aortocoronary bypass graft: Secondary | ICD-10-CM | POA: Diagnosis not present

## 2019-10-10 DIAGNOSIS — I110 Hypertensive heart disease with heart failure: Secondary | ICD-10-CM | POA: Diagnosis not present

## 2019-10-10 DIAGNOSIS — E1151 Type 2 diabetes mellitus with diabetic peripheral angiopathy without gangrene: Secondary | ICD-10-CM | POA: Diagnosis not present

## 2019-10-10 DIAGNOSIS — E1122 Type 2 diabetes mellitus with diabetic chronic kidney disease: Secondary | ICD-10-CM | POA: Diagnosis not present

## 2019-10-10 DIAGNOSIS — N401 Enlarged prostate with lower urinary tract symptoms: Secondary | ICD-10-CM | POA: Diagnosis not present

## 2019-10-10 DIAGNOSIS — I25119 Atherosclerotic heart disease of native coronary artery with unspecified angina pectoris: Secondary | ICD-10-CM | POA: Diagnosis not present

## 2019-10-10 DIAGNOSIS — Z951 Presence of aortocoronary bypass graft: Secondary | ICD-10-CM | POA: Diagnosis not present

## 2019-10-10 DIAGNOSIS — N1831 Chronic kidney disease, stage 3a: Secondary | ICD-10-CM | POA: Diagnosis not present

## 2019-10-10 DIAGNOSIS — R351 Nocturia: Secondary | ICD-10-CM | POA: Diagnosis not present

## 2019-10-10 DIAGNOSIS — I5022 Chronic systolic (congestive) heart failure: Secondary | ICD-10-CM | POA: Diagnosis not present

## 2019-10-11 DIAGNOSIS — R351 Nocturia: Secondary | ICD-10-CM | POA: Diagnosis not present

## 2019-10-11 DIAGNOSIS — E1122 Type 2 diabetes mellitus with diabetic chronic kidney disease: Secondary | ICD-10-CM | POA: Diagnosis not present

## 2019-10-11 DIAGNOSIS — N401 Enlarged prostate with lower urinary tract symptoms: Secondary | ICD-10-CM | POA: Diagnosis not present

## 2019-10-11 DIAGNOSIS — I25119 Atherosclerotic heart disease of native coronary artery with unspecified angina pectoris: Secondary | ICD-10-CM | POA: Diagnosis not present

## 2019-10-11 DIAGNOSIS — E1151 Type 2 diabetes mellitus with diabetic peripheral angiopathy without gangrene: Secondary | ICD-10-CM | POA: Diagnosis not present

## 2019-10-11 DIAGNOSIS — Z951 Presence of aortocoronary bypass graft: Secondary | ICD-10-CM | POA: Diagnosis not present

## 2019-10-11 DIAGNOSIS — N1831 Chronic kidney disease, stage 3a: Secondary | ICD-10-CM | POA: Diagnosis not present

## 2019-10-11 DIAGNOSIS — I5022 Chronic systolic (congestive) heart failure: Secondary | ICD-10-CM | POA: Diagnosis not present

## 2019-10-11 DIAGNOSIS — I110 Hypertensive heart disease with heart failure: Secondary | ICD-10-CM | POA: Diagnosis not present

## 2019-10-13 DIAGNOSIS — R351 Nocturia: Secondary | ICD-10-CM | POA: Diagnosis not present

## 2019-10-13 DIAGNOSIS — Z951 Presence of aortocoronary bypass graft: Secondary | ICD-10-CM | POA: Diagnosis not present

## 2019-10-13 DIAGNOSIS — N1831 Chronic kidney disease, stage 3a: Secondary | ICD-10-CM | POA: Diagnosis not present

## 2019-10-13 DIAGNOSIS — I110 Hypertensive heart disease with heart failure: Secondary | ICD-10-CM | POA: Diagnosis not present

## 2019-10-13 DIAGNOSIS — N401 Enlarged prostate with lower urinary tract symptoms: Secondary | ICD-10-CM | POA: Diagnosis not present

## 2019-10-13 DIAGNOSIS — I25119 Atherosclerotic heart disease of native coronary artery with unspecified angina pectoris: Secondary | ICD-10-CM | POA: Diagnosis not present

## 2019-10-13 DIAGNOSIS — E1122 Type 2 diabetes mellitus with diabetic chronic kidney disease: Secondary | ICD-10-CM | POA: Diagnosis not present

## 2019-10-13 DIAGNOSIS — E1151 Type 2 diabetes mellitus with diabetic peripheral angiopathy without gangrene: Secondary | ICD-10-CM | POA: Diagnosis not present

## 2019-10-13 DIAGNOSIS — I5022 Chronic systolic (congestive) heart failure: Secondary | ICD-10-CM | POA: Diagnosis not present

## 2019-10-14 DIAGNOSIS — I130 Hypertensive heart and chronic kidney disease with heart failure and stage 1 through stage 4 chronic kidney disease, or unspecified chronic kidney disease: Secondary | ICD-10-CM | POA: Diagnosis not present

## 2019-10-14 DIAGNOSIS — Z8673 Personal history of transient ischemic attack (TIA), and cerebral infarction without residual deficits: Secondary | ICD-10-CM | POA: Diagnosis not present

## 2019-10-14 DIAGNOSIS — Z794 Long term (current) use of insulin: Secondary | ICD-10-CM | POA: Diagnosis not present

## 2019-10-14 DIAGNOSIS — R413 Other amnesia: Secondary | ICD-10-CM | POA: Diagnosis not present

## 2019-10-14 DIAGNOSIS — E1121 Type 2 diabetes mellitus with diabetic nephropathy: Secondary | ICD-10-CM | POA: Diagnosis not present

## 2019-10-14 DIAGNOSIS — N179 Acute kidney failure, unspecified: Secondary | ICD-10-CM | POA: Diagnosis not present

## 2019-10-14 DIAGNOSIS — J189 Pneumonia, unspecified organism: Secondary | ICD-10-CM | POA: Diagnosis not present

## 2019-10-14 MED FILL — TAMSULOSIN HCL 0.4 MG CAP: 0.4 | 30 days supply | Qty: 30 | Fill #0

## 2019-10-18 DIAGNOSIS — I110 Hypertensive heart disease with heart failure: Secondary | ICD-10-CM | POA: Diagnosis not present

## 2019-10-18 DIAGNOSIS — I5022 Chronic systolic (congestive) heart failure: Secondary | ICD-10-CM | POA: Diagnosis not present

## 2019-10-18 DIAGNOSIS — E7800 Pure hypercholesterolemia, unspecified: Secondary | ICD-10-CM | POA: Diagnosis not present

## 2019-10-18 DIAGNOSIS — I25118 Atherosclerotic heart disease of native coronary artery with other forms of angina pectoris: Secondary | ICD-10-CM | POA: Diagnosis not present

## 2019-10-18 DIAGNOSIS — I10 Essential (primary) hypertension: Secondary | ICD-10-CM | POA: Diagnosis not present

## 2019-10-18 DIAGNOSIS — I130 Hypertensive heart and chronic kidney disease with heart failure and stage 1 through stage 4 chronic kidney disease, or unspecified chronic kidney disease: Secondary | ICD-10-CM | POA: Diagnosis not present

## 2019-10-18 DIAGNOSIS — N401 Enlarged prostate with lower urinary tract symptoms: Secondary | ICD-10-CM | POA: Diagnosis not present

## 2019-10-18 DIAGNOSIS — E782 Mixed hyperlipidemia: Secondary | ICD-10-CM | POA: Diagnosis not present

## 2019-10-18 DIAGNOSIS — R351 Nocturia: Secondary | ICD-10-CM | POA: Diagnosis not present

## 2019-10-18 DIAGNOSIS — Z951 Presence of aortocoronary bypass graft: Secondary | ICD-10-CM | POA: Diagnosis not present

## 2019-10-18 DIAGNOSIS — H35033 Hypertensive retinopathy, bilateral: Secondary | ICD-10-CM | POA: Diagnosis not present

## 2019-10-18 DIAGNOSIS — N1831 Chronic kidney disease, stage 3a: Secondary | ICD-10-CM | POA: Diagnosis not present

## 2019-10-18 DIAGNOSIS — E1121 Type 2 diabetes mellitus with diabetic nephropathy: Secondary | ICD-10-CM | POA: Diagnosis not present

## 2019-10-18 DIAGNOSIS — E1151 Type 2 diabetes mellitus with diabetic peripheral angiopathy without gangrene: Secondary | ICD-10-CM | POA: Diagnosis not present

## 2019-10-18 DIAGNOSIS — E1122 Type 2 diabetes mellitus with diabetic chronic kidney disease: Secondary | ICD-10-CM | POA: Diagnosis not present

## 2019-10-18 DIAGNOSIS — I25119 Atherosclerotic heart disease of native coronary artery with unspecified angina pectoris: Secondary | ICD-10-CM | POA: Diagnosis not present

## 2019-10-20 ENCOUNTER — Ambulatory Visit: Payer: Medicare HMO | Admitting: Cardiology

## 2019-10-20 DIAGNOSIS — I110 Hypertensive heart disease with heart failure: Secondary | ICD-10-CM | POA: Diagnosis not present

## 2019-10-20 DIAGNOSIS — R351 Nocturia: Secondary | ICD-10-CM | POA: Diagnosis not present

## 2019-10-20 DIAGNOSIS — Z951 Presence of aortocoronary bypass graft: Secondary | ICD-10-CM | POA: Diagnosis not present

## 2019-10-20 DIAGNOSIS — E1122 Type 2 diabetes mellitus with diabetic chronic kidney disease: Secondary | ICD-10-CM | POA: Diagnosis not present

## 2019-10-20 DIAGNOSIS — N1831 Chronic kidney disease, stage 3a: Secondary | ICD-10-CM | POA: Diagnosis not present

## 2019-10-20 DIAGNOSIS — I5022 Chronic systolic (congestive) heart failure: Secondary | ICD-10-CM | POA: Diagnosis not present

## 2019-10-20 DIAGNOSIS — N401 Enlarged prostate with lower urinary tract symptoms: Secondary | ICD-10-CM | POA: Diagnosis not present

## 2019-10-20 DIAGNOSIS — I25119 Atherosclerotic heart disease of native coronary artery with unspecified angina pectoris: Secondary | ICD-10-CM | POA: Diagnosis not present

## 2019-10-20 DIAGNOSIS — E1151 Type 2 diabetes mellitus with diabetic peripheral angiopathy without gangrene: Secondary | ICD-10-CM | POA: Diagnosis not present

## 2019-10-20 NOTE — Progress Notes (Deleted)
Cardiology Office Note    Date:  10/20/2019   ID:  James, [name] 1945-04-28, MRN [id number]  PCP:  Koren Shiver, DO  Cardiologist: Dr. Mayford Knife   Chief Complaint: 3  Months follow up  History of Present Illness:   James [name] is a 74 y.o. male with a history of coronary artery diseases/p CABG with recent PCI to RCA x 2, Chronic combined CHF, diabetes,hypertension,andhyperlipidemia presents for follow up.   Recently echo 09/2017 demonstrated moderately reduced LV function (EF 40-45% during admission sepsis 2nd to LE cellulitis. Outpatient myocardial perfusion stress test showed anteroapical ischemia. He was scheduled for R & L cath however complained diarrhea and stomach pain at day of cath leading to cancellation. Send to ER and admitted for acute sigmoid diverticulitis. Treated with broad spectrum antibiotics. eventually underwent cardiac cath 09/24/2017 and underwent DES to the proximal and distal RCA, patent LIMA to the LAD, SVG to OM and SVG to PDA.  LVEF estimated at 35 to 45%.  Aspirin and Brilinta for minimum of 6 months.  Okay to switch to Plavix if there are cost or bleeding issues.  Recommend long-term clopidogrel given extent of disease.  Right heart cath was consistent with mild pulmonary hypertension.  Patient was on lisinopril which causes cough and creatinine was above baseline so this was not started.    Here today for follow up. He has gained about 20 lb in past 6 months. On our scale, he has gained 10lb since last OV in August. He has stopped taking Torsemide 40mg  daily for past 1 months as he does not like to go bathroom. He has LE edema, orthopnea and DOE. Denies orthopnea but sleeps on multiple pillow. No chest pain, palpitation or dizziness. Eats high salt diet.   Past Medical History:  Diagnosis Date  . Abnormal EKG 03/09/2013  . Abnormal nuclear cardiac imaging test 11/14/2013  . Acute lacunar infarction (HCC) 02/06/2019  . Aortic atherosclerosis (HCC)  07/07/2017  . Arthritis    "all over" (09/30/2017)  . Ascending aortic aneurysm (HCC)    cMRI (2/15): Normal LV EF 54%, Mild BAE, Trileaflet Aortic Valve, Upper limits of normal ascending aorta 3.8 cm, Normal aortic arch 2.3 cm with bovine origin of left carotid  . Atrial flutter (HCC)    Post-op with no reoccurence  . Chronic diastolic heart failure (HCC)    Echo (1/15): Left ventricle: The cavity size was mildly dilated. Mild LVH. EF 55% to 60%. Wall motion was normal; Gr 1 diastolic dysfunction Left atrium: The atrium was mildly dilated. Right atrium: The atrium was mildly to moderately dilated. Aortic arch measures 4.8 cm (moderately dilated); suggest CTA or MRA to better assess.  . Chronic systolic CHF (congestive heart failure) (HCC) 07/24/2017  . CKD (chronic kidney disease), stage III 07/24/2017  . Coronary artery disease 10/2011   a. s/p cabg;  b. Myoview (9/15):  ant ischemia, poss TID, EF 43%, high risk >> LHC (9/15): Dist LM 50, pLAD 90 then 100, pCFX 50, mCFX 75, oRCA 75, dRCA 95, L-LAD patent, S-Dx 100, S-OM1 patent, S-PDA patent, EF 55% >> native Dx small caliber and not well suited for PCI; native RCA amenable to PCI and would restore flow to PLA branches but would jeopardize SVG-RCA - Med Rx  . Deviated nasal septum   . Dilated aortic root (HCC)   . Diverticulitis of sigmoid colon 07/24/2017  . DM (diabetes mellitus), type 2, uncontrolled with complications (HCC)    onset  2013  . Dyslipidemia   . Edema 02/09/2013  . GERD (gastroesophageal reflux disease)   . Hx of Doppler ultrasound    Carotid US (9/13): No ICA stenosis  . Hypertension   . Pleural thickening 07/24/2017    Past Surgical History:  Procedure Laterality Date  . CORONARY ARTERY BYPASS GRAFT  10/27/2011   Procedure: CORONARY ARTERY BYPASS GRAFTING (CABG);  Surgeon: Kerin Perna, MD;  Location: [facility] OR;  Service: Open Heart Surgery;  Laterality: N/A;  Coronary Artery Bypass Grafting times four using left internal  mammary artery and right greater saphenous vein endoscopically harvested  . CORONARY STENT INTERVENTION N/A 09/30/2017   Procedure: CORONARY STENT INTERVENTION;  Surgeon: Corky Crafts, MD;  Location: [facility] INVASIVE CV LAB;  Service: Cardiovascular;  Laterality: N/A;  . KNEE ARTHROSCOPY Left 2003  . KNEE ARTHROSCOPY Right 2006  . LEFT HEART CATHETERIZATION WITH CORONARY ANGIOGRAM N/A 10/23/2011   Procedure: LEFT HEART CATHETERIZATION WITH CORONARY ANGIOGRAM;  Surgeon: Quintella Reichert, MD;  Location: [facility] CATH LAB;  Service: Cardiovascular;  Laterality: N/A;  . LEFT HEART CATHETERIZATION WITH CORONARY ANGIOGRAM N/A 11/16/2013   Procedure: LEFT HEART CATHETERIZATION WITH CORONARY ANGIOGRAM;  Surgeon: Micheline Chapman, MD;  Location: [facility] CATH LAB;  Service: Cardiovascular;  Laterality: N/A;  . RIGHT/LEFT HEART CATH AND CORONARY/GRAFT ANGIOGRAPHY N/A 09/30/2017   Procedure: RIGHT/LEFT HEART CATH AND CORONARY/GRAFT ANGIOGRAPHY;  Surgeon: Corky Crafts, MD;  Location: [facility] INVASIVE CV LAB;  Service: Cardiovascular;  Laterality: N/A;    Current Medications: Prior to Admission medications   Medication Sig Start Date End Date Taking? Authorizing Provider  acetaminophen (TYLENOL) 500 MG tablet Take 1,000 mg by mouth every 6 (six) hours as needed for headache.    [provider]  aspirin 81 MG tablet Take 81 mg by mouth at bedtime.     [provider]  atorvastatin (LIPITOR) 80 MG tablet Take 1 tablet (80 mg total) by mouth daily. 10/01/17 10/01/18  Kroeger, Ovidio Kin., PA-C  cholecalciferol (VITAMIN D) 1000 units tablet Take 1,000 Units by mouth daily.    [provider]  ezetimibe (ZETIA) 10 MG tablet Take 1 tablet (10 mg total) by mouth daily. Patient not taking: Reported on 12/17/2017 09/24/17 12/23/17  Manson Passey, PA  glimepiride (AMARYL) 4 MG tablet Take 4 mg by mouth daily with breakfast.    [provider]  insulin lispro (HUMALOG) 100 UNIT/ML injection  Inject 25 Units into the skin 2 (two) times daily.    [provider]  iron polysaccharides (NIFEREX) 150 MG capsule Take 1 capsule (150 mg total) by mouth daily. Patient not taking: Reported on 12/17/2017 07/30/17   Randel Pigg, Dorma Russell, MD  isosorbide mononitrate (IMDUR) 60 MG 24 hr tablet Take 60 mg by mouth daily.    [provider]  metFORMIN (GLUCOPHAGE) 1000 MG tablet Take 1 tablet (1,000 mg total) by mouth 2 (two) times daily with a meal. 07/30/17 02/16/18  Randel Pigg, Dorma Russell, MD  metoprolol succinate (TOPROL-XL) 100 MG 24 hr tablet Take 125 mg by mouth daily.  08/26/17   [provider]  Multiple Vitamin (MULTIVITAMIN WITH MINERALS) TABS tablet Take 1 tablet by mouth daily.    [provider]  nitroGLYCERIN (NITROSTAT) 0.4 MG SL tablet Place 1 tablet (0.4 mg total) under the tongue every 5 (five) minutes as needed for chest pain. 10/01/17 10/01/18  Kroeger, Ovidio Kin., PA-C  Omega 3 1200 MG CAPS Take 2,400 mg by mouth daily.    [provider]  polyethylene glycol (MIRALAX) packet Take 17 g by mouth daily. Patient taking differently: Take 17 g by mouth daily as needed.  07/30/17   Lenox Ponds, MD  pramipexole (MIRAPEX) 0.75 MG tablet Take 1.5 mg by mouth every evening.     [provider]  ticagrelor (BRILINTA) 90 MG TABS tablet Take 1 tablet (90 mg total) by mouth 2 (two) times daily. 10/01/17   Kroeger, Ovidio Kin., PA-C  torsemide (DEMADEX) 20 MG tablet Take 40 mg by mouth daily.    [provider]    Allergies:   Lisinopril   Social History   Socioeconomic History  . Marital status: Divorced    Spouse name: Not on file  . Number of children: Not on file  . Years of education: Not on file  . Highest education level: Not on file  Occupational History  . Occupation: retired  Tobacco Use  . Smoking status: Never Smoker  . Smokeless tobacco: Never Used  Vaping Use  . Vaping Use: Never used  Substance and Sexual  Activity  . Alcohol use: Yes    Comment: 09/30/2017 "1 beer a month or less"  . Drug use: Never  . Sexual activity: Not Currently  Other Topics Concern  . Not on file  Social History Narrative  . Not on file   Social Determinants of Health   Financial Resource Strain:   . Difficulty of Paying Living Expenses: Not on file  Food Insecurity:   . Worried About Programme researcher, broadcasting/film/video in the Last Year: Not on file  . Ran Out of Food in the Last Year: Not on file  Transportation Needs:   . Lack of Transportation (Medical): Not on file  . Lack of Transportation (Non-Medical): Not on file  Physical Activity:   . Days of Exercise per Week: Not on file  . Minutes of Exercise per Session: Not on file  Stress:   . Feeling of Stress : Not on file  Social Connections:   . Frequency of Communication with Friends and Family: Not on file  . Frequency of Social Gatherings with Friends and Family: Not on file  . Attends Religious Services: Not on file  . Active Member of Clubs or Organizations: Not on file  . Attends Banker Meetings: Not on file  . Marital Status: Not on file     Family History:  The patient's family history includes Hypertension in his father, mother, and paternal grandfather; Stroke (age of onset: 62) in his father.   ROS:   Please see the history of present illness.    ROS All other systems reviewed and are negative.   PHYSICAL EXAM:   VS:  There were no vitals taken for this visit.   GEN: Well nourished, well developed in no acute distress HEENT: Normal NECK: No JVD; No carotid bruits LYMPHATICS: No lymphadenopathy CARDIAC:RRR, no murmurs, rubs, gallops RESPIRATORY:  Clear to auscultation without rales, wheezing or rhonchi  ABDOMEN: Soft, non-tender, non-distended MUSCULOSKELETAL:  No edema; No deformity  SKIN: Warm and dry NEUROLOGIC:  Alert and oriented x 3 PSYCHIATRIC:  Normal affect    Wt Readings from Last 3 Encounters:  09/13/19 (!) 237 lb 1.6  oz (107.5 kg)  04/27/19 260 lb 3.2 oz (118 kg)  04/22/19 262 lb 6.4 oz (119 kg)      Studies/Labs Reviewed:   EKG:  EKG is not ordered today.    Recent Labs: 09/09/2019: ALT 16; B Natriuretic Peptide  175.1; Magnesium 2.0; TSH 1.237 09/13/2019: BUN 30; Creatinine, Ser 1.79; Hemoglobin 15.5; Platelets 239; Potassium 3.8; Sodium 137   Lipid Panel    Component Value Date/Time   CHOL 209 (H) 02/06/2019 0922   CHOL 116 10/13/2017 0902   TRIG 137 02/06/2019 0922   HDL 42 02/06/2019 0922   HDL 48 10/13/2017 0902   CHOLHDL 5.0 02/06/2019 0922   VLDL 27 02/06/2019 0922   LDLCALC 140 (H) 02/06/2019 0922   LDLCALC 53 10/13/2017 0902    Additional studies/ records that were reviewed today include:   Right and left heart catheterization 09/30/17:  Ost RCA to Prox RCA lesion is 90% stenosed.  A drug-eluting stent was successfully placed using a STENT SYNERGY DES [id number].  Post intervention, there is a 0% residual stenosis.  Dist RCA lesion is 80% stenosed.  Post intervention, there is a 0% residual stenosis.  A drug-eluting stent was successfully placed using a STENT SYNERGY DES [id number].  Ost RPDA to RPDA lesion is 100% stenosed. SVG to PDA is patent.  Ost LAD to Prox LAD lesion is 99% stenosed. LIMA to LAD is patent and fills a diagonal retrograde.  Ost 2nd Mrg to 2nd Mrg lesion is 95% stenosed. Prox Cx to Mid Cx lesion is 80% stenosed. SVG to OM is patent.  Origin to Prox Graft lesion is 100% stenosed.  LV end diastolic pressure is normal.  The left ventricular ejection fraction is 35-45% by visual estimate.  There is mild to moderate left ventricular systolic dysfunction.  There is no aortic valve stenosis.  Hemodynamic findings consistent with mild pulmonary hypertension.  CO 8.1 L/min; CI 3.6, PA pressure 40/20; PCWP mean 8 mm Hg. Ao sat 98%; PA 74%  Recommend uninterrupted dual antiplatelet therapy with Aspirin 81mg  daily and Ticagrelor 90mg  twice dailyfor a  minimum of 6 months (stable ischemic heart disease - Class I recommendation).  OK to switch to Plavix if there are cost issues or bleeding problems.   Would consider longterm clopidogrel therapy given the extent of his disease. _____________   2D echo 06/18/2017------------------------------------------------------------------- Study Conclusions  - Procedure narrative: Transthoracic echocardiography. Image quality was adequate. The study was technically difficult, as a result of poor acoustic windows and body habitus. Intravenous contrast (Definity) was administered. - Left ventricle: The cavity size was normal. Wall thickness was increased in a pattern of mild LVH. Systolic function was mildly to moderately reduced. The estimated ejection fraction was in the range of 40% to 45%. Incoordinate septal motion and possible anteroseptal hypokinesis. The study is not technically sufficient to allow evaluation of LV diastolic function. - Left atrium: The atrium was normal in size. - Right atrium: Moderately dilated. - Inferior vena cava: The vessel was dilated. The respirophasic diameter changes were blunted (<50%), consistent with elevated central venous pressure.  Impressions:  - Technically difficult study. Definity contrast given. LVEF 40-45%, mild LVH, incoordinate septal motion and possible anteroseptal hypokinesis, normal LA size, moderate RAE, dilated IVC.    ASSESSMENT & PLAN:    1. ASCAD -s/p CABG and DES to the proximal and distal RCA 09/30/2017  -he denies any anginal symptoms -Continue ASA, BB, Imdur 60mg  daily and statin   2. Chronic combined systolic/diastolic CHF/ICM - Echo showed LVEF of 40-45%. EF was 35-40% by cath.  -he is NYHA class 2a -he denies any SOB or LE edema -appears euvolemic on exam today -continue Toprol XL 50mg  daily and Torsemide 20mg  daily  3. HTN - BP controlled on exam -continue Toprol  XL 50mg   daily  4. HLD -LDL goal < 70 -LDL was 140 in Dec 2020 -repeat FLP and ALT -Continue lipitor 80mg  daily and Zetia 10mg    5.  Type 2 DM -followed by PCP -continue Metformin 1000 mg daily  6.  CKD stage 3a -followed by PCP -SCr was 1.75 last month    Medication Adjustments/Labs and Tests Ordered: Current medicines are reviewed at length with the patient today.  Concerns regarding medicines are outlined above.  Medication changes, Labs and Tests ordered today are listed in the Patient Instructions below. There are no Patient Instructions on file for this visit.   Signed, Armanda Magicraci Latanga Nedrow, MD  10/20/2019 9:04 AM    [facility] [address], [place], [place]  [address] Phone: [phone]; Fax: [phone]

## 2019-10-23 LAB — CUP PACEART REMOTE DEVICE CHECK
Date Time Interrogation Session: 20210901230609
Implantable Pulse Generator Implant Date: 20210310

## 2019-10-25 ENCOUNTER — Ambulatory Visit (INDEPENDENT_AMBULATORY_CARE_PROVIDER_SITE_OTHER): Payer: Medicare HMO | Admitting: *Deleted

## 2019-10-25 ENCOUNTER — Other Ambulatory Visit (HOSPITAL_BASED_OUTPATIENT_CLINIC_OR_DEPARTMENT_OTHER): Payer: Self-pay | Admitting: Family Medicine

## 2019-10-25 DIAGNOSIS — I639 Cerebral infarction, unspecified: Secondary | ICD-10-CM | POA: Diagnosis not present

## 2019-10-25 MED FILL — CLOPIDOGREL 75 MG TABLET: 75 | 30 days supply | Qty: 30 | Fill #0

## 2019-10-26 DIAGNOSIS — N1831 Chronic kidney disease, stage 3a: Secondary | ICD-10-CM | POA: Diagnosis not present

## 2019-10-26 DIAGNOSIS — R351 Nocturia: Secondary | ICD-10-CM | POA: Diagnosis not present

## 2019-10-26 DIAGNOSIS — Z951 Presence of aortocoronary bypass graft: Secondary | ICD-10-CM | POA: Diagnosis not present

## 2019-10-26 DIAGNOSIS — I25119 Atherosclerotic heart disease of native coronary artery with unspecified angina pectoris: Secondary | ICD-10-CM | POA: Diagnosis not present

## 2019-10-26 DIAGNOSIS — I110 Hypertensive heart disease with heart failure: Secondary | ICD-10-CM | POA: Diagnosis not present

## 2019-10-26 DIAGNOSIS — E1122 Type 2 diabetes mellitus with diabetic chronic kidney disease: Secondary | ICD-10-CM | POA: Diagnosis not present

## 2019-10-26 DIAGNOSIS — E1151 Type 2 diabetes mellitus with diabetic peripheral angiopathy without gangrene: Secondary | ICD-10-CM | POA: Diagnosis not present

## 2019-10-26 DIAGNOSIS — N401 Enlarged prostate with lower urinary tract symptoms: Secondary | ICD-10-CM | POA: Diagnosis not present

## 2019-10-26 DIAGNOSIS — I5022 Chronic systolic (congestive) heart failure: Secondary | ICD-10-CM | POA: Diagnosis not present

## 2019-10-26 NOTE — Progress Notes (Signed)
Carelink Summary Report / Loop Recorder 

## 2019-11-01 DIAGNOSIS — N401 Enlarged prostate with lower urinary tract symptoms: Secondary | ICD-10-CM | POA: Diagnosis not present

## 2019-11-01 DIAGNOSIS — I25119 Atherosclerotic heart disease of native coronary artery with unspecified angina pectoris: Secondary | ICD-10-CM | POA: Diagnosis not present

## 2019-11-01 DIAGNOSIS — Z951 Presence of aortocoronary bypass graft: Secondary | ICD-10-CM | POA: Diagnosis not present

## 2019-11-01 DIAGNOSIS — I110 Hypertensive heart disease with heart failure: Secondary | ICD-10-CM | POA: Diagnosis not present

## 2019-11-01 DIAGNOSIS — N1831 Chronic kidney disease, stage 3a: Secondary | ICD-10-CM | POA: Diagnosis not present

## 2019-11-01 DIAGNOSIS — E1122 Type 2 diabetes mellitus with diabetic chronic kidney disease: Secondary | ICD-10-CM | POA: Diagnosis not present

## 2019-11-01 DIAGNOSIS — I5022 Chronic systolic (congestive) heart failure: Secondary | ICD-10-CM | POA: Diagnosis not present

## 2019-11-01 DIAGNOSIS — E1151 Type 2 diabetes mellitus with diabetic peripheral angiopathy without gangrene: Secondary | ICD-10-CM | POA: Diagnosis not present

## 2019-11-01 DIAGNOSIS — R351 Nocturia: Secondary | ICD-10-CM | POA: Diagnosis not present

## 2019-11-02 ENCOUNTER — Other Ambulatory Visit (HOSPITAL_BASED_OUTPATIENT_CLINIC_OR_DEPARTMENT_OTHER): Payer: Self-pay | Admitting: Family Medicine

## 2019-11-02 MED FILL — BD PEN NDL SHORT 31GX5/16: 31G X 8 MM | 50 days supply | Qty: 100 | Fill #0

## 2019-11-02 MED FILL — SM ALCOHOL 70% PREP PADS: 70 | 50 days supply | Qty: 100 | Fill #0

## 2019-11-02 MED FILL — PRAMIPEXOLE 0.75 MG TABLET: 0.75 | 90 days supply | Qty: 180 | Fill #0

## 2019-11-03 DIAGNOSIS — N179 Acute kidney failure, unspecified: Secondary | ICD-10-CM | POA: Diagnosis not present

## 2019-11-07 ENCOUNTER — Ambulatory Visit: Payer: Medicare HMO | Attending: Internal Medicine

## 2019-11-07 DIAGNOSIS — Z23 Encounter for immunization: Secondary | ICD-10-CM

## 2019-11-07 MED FILL — LISINOPRIL 20 MG TABS: 20 | 90 days supply | Qty: 90 | Fill #1

## 2019-11-07 MED FILL — TOUJEO MAX SOLOSTAR 300 UNI: 300 | 38 days supply | Qty: 3 | Fill #4

## 2019-11-07 NOTE — Progress Notes (Signed)
   Covid-19 Vaccination Clinic  Name:  James [name]    MRN: [id number] DOB: [birth year]  11/07/2019  [name] was observed post Covid-19 immunization for 15 minutes without incident. He was provided with Vaccine Information Sheet and instruction to access the V-Safe system. Vaccinated by Toni Amend.  [name] was instructed to call 911 with any severe reactions post vaccine: Marland Kitchen Difficulty breathing  . Swelling of face and throat  . A fast heartbeat  . A bad rash all over body  . Dizziness and weakness   Immunizations Administered    Name Date Dose VIS Date Route   Moderna COVID-19 Vaccine 11/07/2019  2:48 PM 0.5 mL 01/2019 Intramuscular   Manufacturer: Moderna   Lot: [id number]   NDC: 03491-791-50

## 2019-11-11 MED FILL — MODERNA COVID-19 VACCINE 10: 100 | 1 days supply | Qty: 1 | Fill #0

## 2019-11-16 DIAGNOSIS — E7800 Pure hypercholesterolemia, unspecified: Secondary | ICD-10-CM | POA: Diagnosis not present

## 2019-11-16 DIAGNOSIS — E782 Mixed hyperlipidemia: Secondary | ICD-10-CM | POA: Diagnosis not present

## 2019-11-16 DIAGNOSIS — I700 Atherosclerosis of aorta: Secondary | ICD-10-CM | POA: Diagnosis not present

## 2019-11-16 DIAGNOSIS — I130 Hypertensive heart and chronic kidney disease with heart failure and stage 1 through stage 4 chronic kidney disease, or unspecified chronic kidney disease: Secondary | ICD-10-CM | POA: Diagnosis not present

## 2019-11-16 DIAGNOSIS — I5022 Chronic systolic (congestive) heart failure: Secondary | ICD-10-CM | POA: Diagnosis not present

## 2019-11-16 DIAGNOSIS — I25119 Atherosclerotic heart disease of native coronary artery with unspecified angina pectoris: Secondary | ICD-10-CM | POA: Diagnosis not present

## 2019-11-16 DIAGNOSIS — N1831 Chronic kidney disease, stage 3a: Secondary | ICD-10-CM | POA: Diagnosis not present

## 2019-11-16 DIAGNOSIS — I25118 Atherosclerotic heart disease of native coronary artery with other forms of angina pectoris: Secondary | ICD-10-CM | POA: Diagnosis not present

## 2019-11-16 DIAGNOSIS — H35033 Hypertensive retinopathy, bilateral: Secondary | ICD-10-CM | POA: Diagnosis not present

## 2019-11-16 DIAGNOSIS — R413 Other amnesia: Secondary | ICD-10-CM | POA: Diagnosis not present

## 2019-11-16 DIAGNOSIS — I10 Essential (primary) hypertension: Secondary | ICD-10-CM | POA: Diagnosis not present

## 2019-11-16 DIAGNOSIS — Z955 Presence of coronary angioplasty implant and graft: Secondary | ICD-10-CM | POA: Diagnosis not present

## 2019-11-16 DIAGNOSIS — Z8673 Personal history of transient ischemic attack (TIA), and cerebral infarction without residual deficits: Secondary | ICD-10-CM | POA: Diagnosis not present

## 2019-11-16 DIAGNOSIS — E1122 Type 2 diabetes mellitus with diabetic chronic kidney disease: Secondary | ICD-10-CM | POA: Diagnosis not present

## 2019-11-16 DIAGNOSIS — E1121 Type 2 diabetes mellitus with diabetic nephropathy: Secondary | ICD-10-CM | POA: Diagnosis not present

## 2019-11-16 DIAGNOSIS — I2510 Atherosclerotic heart disease of native coronary artery without angina pectoris: Secondary | ICD-10-CM | POA: Diagnosis not present

## 2019-11-16 DIAGNOSIS — Z794 Long term (current) use of insulin: Secondary | ICD-10-CM | POA: Diagnosis not present

## 2019-11-18 ENCOUNTER — Other Ambulatory Visit (HOSPITAL_BASED_OUTPATIENT_CLINIC_OR_DEPARTMENT_OTHER): Payer: Self-pay | Admitting: Internal Medicine

## 2019-11-18 MED FILL — CLOPIDOGREL 75 MG TABLET: 75 | 30 days supply | Qty: 30 | Fill #1

## 2019-11-18 MED FILL — FLUAD QUADRIVALENT 0.5 ML P: 0.5 | 1 days supply | Qty: 1 | Fill #0

## 2019-11-21 ENCOUNTER — Other Ambulatory Visit (HOSPITAL_BASED_OUTPATIENT_CLINIC_OR_DEPARTMENT_OTHER): Payer: Self-pay | Admitting: Internal Medicine

## 2019-11-21 ENCOUNTER — Emergency Department (HOSPITAL_COMMUNITY): Payer: Medicare HMO

## 2019-11-21 ENCOUNTER — Inpatient Hospital Stay (HOSPITAL_COMMUNITY)
Admission: EM | Admit: 2019-11-21 | Discharge: 2019-11-23 | DRG: 065 | Disposition: A | Discharge status: Skilled Nursing Facility | Payer: Medicare HMO | Attending: Internal Medicine | Admitting: Internal Medicine

## 2019-11-21 ENCOUNTER — Encounter (HOSPITAL_COMMUNITY): Payer: Self-pay | Admitting: Emergency Medicine

## 2019-11-21 DIAGNOSIS — Z8249 Family history of ischemic heart disease and other diseases of the circulatory system: Secondary | ICD-10-CM

## 2019-11-21 DIAGNOSIS — Z888 Allergy status to other drugs, medicaments and biological substances status: Secondary | ICD-10-CM

## 2019-11-21 DIAGNOSIS — R29810 Facial weakness: Secondary | ICD-10-CM | POA: Diagnosis present

## 2019-11-21 DIAGNOSIS — Z955 Presence of coronary angioplasty implant and graft: Secondary | ICD-10-CM

## 2019-11-21 DIAGNOSIS — I5042 Chronic combined systolic (congestive) and diastolic (congestive) heart failure: Secondary | ICD-10-CM | POA: Diagnosis present

## 2019-11-21 DIAGNOSIS — I2510 Atherosclerotic heart disease of native coronary artery without angina pectoris: Secondary | ICD-10-CM | POA: Diagnosis present

## 2019-11-21 DIAGNOSIS — Z20822 Contact with and (suspected) exposure to covid-19: Secondary | ICD-10-CM | POA: Diagnosis present

## 2019-11-21 DIAGNOSIS — Z7902 Long term (current) use of antithrombotics/antiplatelets: Secondary | ICD-10-CM

## 2019-11-21 DIAGNOSIS — I130 Hypertensive heart and chronic kidney disease with heart failure and stage 1 through stage 4 chronic kidney disease, or unspecified chronic kidney disease: Secondary | ICD-10-CM | POA: Diagnosis present

## 2019-11-21 DIAGNOSIS — I6389 Other cerebral infarction: Secondary | ICD-10-CM | POA: Diagnosis not present

## 2019-11-21 DIAGNOSIS — I712 Thoracic aortic aneurysm, without rupture: Secondary | ICD-10-CM | POA: Diagnosis present

## 2019-11-21 DIAGNOSIS — Z951 Presence of aortocoronary bypass graft: Secondary | ICD-10-CM | POA: Diagnosis not present

## 2019-11-21 DIAGNOSIS — N183 Chronic kidney disease, stage 3 unspecified: Secondary | ICD-10-CM | POA: Diagnosis not present

## 2019-11-21 DIAGNOSIS — R471 Dysarthria and anarthria: Secondary | ICD-10-CM | POA: Diagnosis present

## 2019-11-21 DIAGNOSIS — Z8673 Personal history of transient ischemic attack (TIA), and cerebral infarction without residual deficits: Secondary | ICD-10-CM

## 2019-11-21 DIAGNOSIS — R0602 Shortness of breath: Secondary | ICD-10-CM | POA: Diagnosis not present

## 2019-11-21 DIAGNOSIS — M159 Polyosteoarthritis, unspecified: Secondary | ICD-10-CM | POA: Diagnosis present

## 2019-11-21 DIAGNOSIS — I6381 Other cerebral infarction due to occlusion or stenosis of small artery: Principal | ICD-10-CM | POA: Diagnosis present

## 2019-11-21 DIAGNOSIS — S40811A Abrasion of right upper arm, initial encounter: Secondary | ICD-10-CM | POA: Diagnosis not present

## 2019-11-21 DIAGNOSIS — I700 Atherosclerosis of aorta: Secondary | ICD-10-CM | POA: Diagnosis present

## 2019-11-21 DIAGNOSIS — IMO0002 Reserved for concepts with insufficient information to code with codable children: Secondary | ICD-10-CM | POA: Diagnosis present

## 2019-11-21 DIAGNOSIS — I152 Hypertension secondary to endocrine disorders: Secondary | ICD-10-CM | POA: Diagnosis present

## 2019-11-21 DIAGNOSIS — I10 Essential (primary) hypertension: Secondary | ICD-10-CM | POA: Diagnosis not present

## 2019-11-21 DIAGNOSIS — Z7982 Long term (current) use of aspirin: Secondary | ICD-10-CM

## 2019-11-21 DIAGNOSIS — Z823 Family history of stroke: Secondary | ICD-10-CM

## 2019-11-21 DIAGNOSIS — I5022 Chronic systolic (congestive) heart failure: Secondary | ICD-10-CM | POA: Diagnosis present

## 2019-11-21 DIAGNOSIS — Z79899 Other long term (current) drug therapy: Secondary | ICD-10-CM

## 2019-11-21 DIAGNOSIS — G8191 Hemiplegia, unspecified affecting right dominant side: Secondary | ICD-10-CM | POA: Diagnosis present

## 2019-11-21 DIAGNOSIS — I672 Cerebral atherosclerosis: Secondary | ICD-10-CM | POA: Diagnosis not present

## 2019-11-21 DIAGNOSIS — E1122 Type 2 diabetes mellitus with diabetic chronic kidney disease: Secondary | ICD-10-CM | POA: Diagnosis present

## 2019-11-21 DIAGNOSIS — I255 Ischemic cardiomyopathy: Secondary | ICD-10-CM | POA: Diagnosis present

## 2019-11-21 DIAGNOSIS — G459 Transient cerebral ischemic attack, unspecified: Secondary | ICD-10-CM | POA: Diagnosis not present

## 2019-11-21 DIAGNOSIS — Z66 Do not resuscitate: Secondary | ICD-10-CM | POA: Diagnosis present

## 2019-11-21 DIAGNOSIS — E785 Hyperlipidemia, unspecified: Secondary | ICD-10-CM | POA: Diagnosis present

## 2019-11-21 DIAGNOSIS — K219 Gastro-esophageal reflux disease without esophagitis: Secondary | ICD-10-CM | POA: Diagnosis present

## 2019-11-21 DIAGNOSIS — I639 Cerebral infarction, unspecified: Secondary | ICD-10-CM

## 2019-11-21 DIAGNOSIS — E118 Type 2 diabetes mellitus with unspecified complications: Secondary | ICD-10-CM | POA: Diagnosis not present

## 2019-11-21 DIAGNOSIS — R29704 NIHSS score 4: Secondary | ICD-10-CM | POA: Diagnosis present

## 2019-11-21 DIAGNOSIS — R29818 Other symptoms and signs involving the nervous system: Secondary | ICD-10-CM | POA: Diagnosis not present

## 2019-11-21 DIAGNOSIS — E1169 Type 2 diabetes mellitus with other specified complication: Secondary | ICD-10-CM | POA: Diagnosis present

## 2019-11-21 DIAGNOSIS — R42 Dizziness and giddiness: Secondary | ICD-10-CM | POA: Diagnosis not present

## 2019-11-21 DIAGNOSIS — J3489 Other specified disorders of nose and nasal sinuses: Secondary | ICD-10-CM | POA: Diagnosis not present

## 2019-11-21 DIAGNOSIS — I63233 Cerebral infarction due to unspecified occlusion or stenosis of bilateral carotid arteries: Secondary | ICD-10-CM | POA: Diagnosis not present

## 2019-11-21 DIAGNOSIS — I440 Atrioventricular block, first degree: Secondary | ICD-10-CM | POA: Diagnosis not present

## 2019-11-21 LAB — COMPREHENSIVE METABOLIC PANEL
ALT: 19 U/L (ref 0–44)
AST: 21 U/L (ref 15–41)
Albumin: 3.8 g/dL (ref 3.5–5.0)
Alkaline Phosphatase: 82 U/L (ref 38–126)
Anion gap: 10 (ref 5–15)
BUN: 14 mg/dL (ref 8–23)
CO2: 26 mmol/L (ref 22–32)
Calcium: 9.4 mg/dL (ref 8.9–10.3)
Chloride: 103 mmol/L (ref 98–111)
Creatinine, Ser: 1.29 mg/dL — ABNORMAL HIGH (ref 0.61–1.24)
GFR calc Af Amer: >60 mL/min (ref >60)
GFR calc non Af Amer: 54 mL/min — ABNORMAL LOW (ref >60)
Glucose, Bld: 126 mg/dL — ABNORMAL HIGH (ref 70–99)
Potassium: 3.7 mmol/L (ref 3.5–5.1)
Sodium: 139 mmol/L (ref 135–145)
Total Bilirubin: 0.7 mg/dL (ref 0.3–1.2)
Total Protein: 7.3 g/dL (ref 6.5–8.1)

## 2019-11-21 LAB — CBC
HCT: 40.8 % (ref 39.0–52.0)
Hemoglobin: 13.7 g/dL (ref 13.0–17.0)
MCH: 28.8 pg (ref 26.0–34.0)
MCHC: 33.6 g/dL (ref 30.0–36.0)
MCV: 85.9 fL (ref 80.0–100.0)
Platelets: 277 10*3/uL (ref 150–400)
RBC: 4.75 MIL/uL (ref 4.22–5.81)
RDW: 13.2 % (ref 11.5–15.5)
WBC: 9.2 10*3/uL (ref 4.0–10.5)
nRBC: 0 % (ref 0.0–0.2)

## 2019-11-21 LAB — APTT: aPTT: 28 seconds (ref 24–36)

## 2019-11-21 LAB — DIFFERENTIAL
Abs Immature Granulocytes: 0.04 10*3/uL (ref 0.00–0.07)
Basophils Absolute: 0.1 10*3/uL (ref 0.0–0.1)
Basophils Relative: 1 %
Eosinophils Absolute: 0.1 10*3/uL (ref 0.0–0.5)
Eosinophils Relative: 1 %
Immature Granulocytes: 0 %
Lymphocytes Relative: 12 %
Lymphs Abs: 1.1 10*3/uL (ref 0.7–4.0)
Monocytes Absolute: 1 10*3/uL (ref 0.1–1.0)
Monocytes Relative: 10 %
Neutro Abs: 6.9 10*3/uL (ref 1.7–7.7)
Neutrophils Relative %: 76 %

## 2019-11-21 LAB — RESPIRATORY PANEL BY RT PCR (FLU A&B, COVID)
Influenza A by PCR: NEGATIVE
Influenza B by PCR: NEGATIVE
SARS Coronavirus 2 by RT PCR: NEGATIVE

## 2019-11-21 LAB — I-STAT CHEM 8, ED
BUN: 15 mg/dL (ref 8–23)
Calcium, Ion: 1.21 mmol/L (ref 1.15–1.40)
Chloride: 101 mmol/L (ref 98–111)
Creatinine, Ser: 1.3 mg/dL — ABNORMAL HIGH (ref 0.61–1.24)
Glucose, Bld: 124 mg/dL — ABNORMAL HIGH (ref 70–99)
HCT: 41 % (ref 39.0–52.0)
Hemoglobin: 13.9 g/dL (ref 13.0–17.0)
Potassium: 3.7 mmol/L (ref 3.5–5.1)
Sodium: 141 mmol/L (ref 135–145)
TCO2: 29 mmol/L (ref 22–32)

## 2019-11-21 LAB — PROTIME-INR
INR: 1 (ref 0.8–1.2)
Prothrombin Time: 12.9 seconds (ref 11.4–15.2)

## 2019-11-21 LAB — CBG MONITORING, ED: Glucose-Capillary: 114 mg/dL — ABNORMAL HIGH (ref 70–99)

## 2019-11-21 LAB — TROPONIN I (HIGH SENSITIVITY): Troponin I (High Sensitivity): 33 ng/L — ABNORMAL HIGH (ref <18)

## 2019-11-21 LAB — ETHANOL: Alcohol, Ethyl (B): <10 mg/dL (ref <10)

## 2019-11-21 MED ORDER — INSULIN GLARGINE 100 UNIT/ML ~~LOC~~ SOLN
12.0000 [IU] | Freq: Every day | SUBCUTANEOUS | Status: DC
  Filled 2019-11-21 (×2): qty 0.12

## 2019-11-21 MED ORDER — LABETALOL HCL 5 MG/ML IV SOLN: 5.0000 mg | INTRAVENOUS | Status: DC | PRN

## 2019-11-21 MED ORDER — ISOSORBIDE MONONITRATE ER 60 MG PO TB24
60.0000 mg | ORAL_TABLET | Freq: Every day | ORAL | Status: DC
  Administered 2019-11-22 – 2019-11-23 (×2): 60 mg via ORAL
  Filled 2019-11-21: qty 1
  Filled 2019-11-21: qty 2

## 2019-11-21 MED ORDER — ACETAMINOPHEN 325 MG PO TABS
650.0000 mg | ORAL_TABLET | ORAL | Status: DC | PRN
  Administered 2019-11-22: 650 mg via ORAL
  Filled 2019-11-21: qty 2

## 2019-11-21 MED ORDER — STROKE: EARLY STAGES OF RECOVERY BOOK: Freq: Once | Status: AC

## 2019-11-21 MED ORDER — INSULIN ASPART 100 UNIT/ML ~~LOC~~ SOLN
0.0000 [IU] | Freq: Three times a day (TID) | SUBCUTANEOUS | Status: DC
  Administered 2019-11-22 (×2): 2 [IU] via SUBCUTANEOUS
  Administered 2019-11-23 (×2): 1 [IU] via SUBCUTANEOUS

## 2019-11-21 MED ORDER — GADOBUTROL 1 MMOL/ML IV SOLN
10.0000 mL | Freq: Once | INTRAVENOUS | Status: AC | PRN
  Administered 2019-11-21: 10 mL via INTRAVENOUS

## 2019-11-21 MED ORDER — CLOPIDOGREL BISULFATE 75 MG PO TABS
75.0000 mg | ORAL_TABLET | Freq: Every day | ORAL | Status: DC
  Administered 2019-11-21 – 2019-11-22 (×2): 75 mg via ORAL
  Filled 2019-11-21 (×2): qty 1

## 2019-11-21 MED ORDER — ENOXAPARIN SODIUM 40 MG/0.4ML ~~LOC~~ SOLN
40.0000 mg | SUBCUTANEOUS | Status: DC
  Administered 2019-11-21 – 2019-11-22 (×2): 40 mg via SUBCUTANEOUS
  Filled 2019-11-21 (×2): qty 0.4

## 2019-11-21 MED ORDER — INSULIN GLARGINE 100 UNIT/ML ~~LOC~~ SOLN
21.0000 [IU] | Freq: Every day | SUBCUTANEOUS | Status: DC
  Filled 2019-11-21: qty 0.21

## 2019-11-21 MED ORDER — EZETIMIBE 10 MG PO TABS
10.0000 mg | ORAL_TABLET | Freq: Every day | ORAL | Status: DC
  Administered 2019-11-22 – 2019-11-23 (×2): 10 mg via ORAL
  Filled 2019-11-21 (×2): qty 1

## 2019-11-21 MED ORDER — ASPIRIN 81 MG PO CHEW
81.0000 mg | CHEWABLE_TABLET | Freq: Every day | ORAL | Status: DC
  Administered 2019-11-21 – 2019-11-23 (×3): 81 mg via ORAL
  Filled 2019-11-21 (×3): qty 1

## 2019-11-21 MED ORDER — TORSEMIDE 20 MG PO TABS
20.0000 mg | ORAL_TABLET | Freq: Every day | ORAL | Status: DC
  Administered 2019-11-22 – 2019-11-23 (×2): 20 mg via ORAL
  Filled 2019-11-21 (×2): qty 1

## 2019-11-21 MED ORDER — SENNOSIDES-DOCUSATE SODIUM 8.6-50 MG PO TABS: 1.0000 | ORAL_TABLET | Freq: Every evening | ORAL | Status: DC | PRN

## 2019-11-21 MED ORDER — ACETAMINOPHEN 160 MG/5ML PO SOLN: 650.0000 mg | ORAL | Status: DC | PRN

## 2019-11-21 MED ORDER — ATORVASTATIN CALCIUM 80 MG PO TABS
80.0000 mg | ORAL_TABLET | Freq: Every day | ORAL | Status: DC
  Administered 2019-11-21 – 2019-11-23 (×3): 80 mg via ORAL
  Filled 2019-11-21: qty 1
  Filled 2019-11-21 (×2): qty 2

## 2019-11-21 MED ORDER — ACETAMINOPHEN 650 MG RE SUPP: 650.0000 mg | RECTAL | Status: DC | PRN

## 2019-11-21 MED FILL — metFORMIN HCL ER 500 MG TB2: 500 | 30 days supply | Qty: 60 | Fill #0

## 2019-11-21 MED FILL — FREESTYLE LIBRE 14 DAY SENS: 28 days supply | Qty: 2 | Fill #0

## 2019-11-21 NOTE — ED Triage Notes (Signed)
Pt here as a code stroke after ems was called out for a fall , pt with right side weakness and facial drop , lsn was 1000 , cbg 131, b/p was elevated ,

## 2019-11-21 NOTE — ED Notes (Addendum)
Pt asking for something to drink. Informed Kim - RN.

## 2019-11-21 NOTE — Code Documentation (Signed)
Stroke Response Nurse Documentation Code Documentation  James [name] is a 74 y.o. male arriving to [facility] ED via [facility] on 10/4 with past medical hx of strokes, HTN, GERD, CAD, A Flutter, CHF, DM, CKD. Code stroke was activated by ED after James [name] called and reported sudden onset of stroke like symptoms.   Patient from home where he was LKW at 1000 per EMS and now complaining of right sided weakness. Pt pressed life alert this morning after a fall at 0700. Pt reports he just couldn't seem to get his balance and fell. He did not seek medical treatment at this time. He continued to stay at home and reports no deficits at this time. At 1000, pt reports a sudden onset of right sided weakness. He stayed in his chair and did not try to walk after this. He called EMS and they noted right facial droop, right arm weakness, and right leg weakness.    Patient saw a neurologist five days ago after his stroke in December. No deficits noted at this time. Pt was reported not taking his Brilinta as prescribed.   Stroke team at the bedside on patient arrival to CT. Labs drawn and patient cleared for CT by EDP. NIHSS 4, see documentation for details and code stroke times. Patient with right facial droop, right arm weakness, right leg weakness and dysarthria  on exam. The following imaging was completed:  CT Head. Patient is not a candidate for tPA due to being outside the window. Care/Plan: MRI, q2 mNIHSS/VS. Bedside handoff with ED RN James [name].    James [name]  Stroke Response RN

## 2019-11-21 NOTE — ED Provider Notes (Signed)
  Patient arrived to the ED via [facility] with complaint of suspected stroke.  No code stroke activation in the field as the crew was unaware as to the procedure in our [place].  Had a fall around 7 AM that the patient states was due to feeling unsteady on his feet, which is not unusual for him.  He was evaluated by fire department, found to have right arm abrasion, hypertensive, but otherwise complaint free.  Refuse transport.  10 AM patient endorses onset of right sided arm and leg weakness.  He has a history of prior strokes, however, states he has no lasting deficits.   Upon my assessment of the patient at the bridge: Speaking clearly without voice deficits. No noted facial droop. Handling oral secretions without noted difficulty and maintaining his airway. No noted difficulty breathing.  Right arm weaker than the left with right arm drift. Right leg seems to be mildly weaker than the left.  Patient examined at 2:38 PM and code stroke activated at 2:40 PM.  Patient taken directly to CT scanner on EMS stretcher.   Anselm Pancoast, PA-C 11/21/19 1501    Eber Hong, MD 11/21/19 [phone]

## 2019-11-21 NOTE — H&P (Signed)
History and Physical    James [name] JSE:[id number] DOB: 1945/12/24 DOA: 11/21/2019  PCP: Koren Shiver, DO  Patient coming from: Home via EMS  I have personally briefly reviewed patient's old medical records in [facility]  Chief Complaint: Right-sided weakness  HPI: James [name] is a 74 y.o. male with medical history significant for CAD s/p CABG, history of CVA with cryptogenic stroke in December 2020, chronic systolic CHF, CKD stage III, type 2 diabetes, hypertension, hyperlipidemia, and recurrent falls who presents to the ED for evaluation of right-sided weakness.  Patient had a fall at home about 2 weeks ago outside the pavement and he was too weak to get up on his own therefore was on the ground overnight before feeling better.    This morning around 7 AM he was found down at home.  Fire department was called and he was noted to have elevated blood pressure up to 240/140.  He was found to have a right arm abrasion but no obvious weakness per report.  10 AM EMS was called back to his house due to another fall.  At this point he was noted to have right-sided weakness with right facial droop and was brought to the ED as a code stroke.  Patient states he has continued weakness of his right arm.  He says he has had lightheadedness with feeling of off balance but no room spinning sensation.  He denies any chest pain, palpitations, dyspnea, cough, abdominal pain, nausea, vomiting.  He says he is taking his medications as prescribed including Plavix.  He says he is not on daily aspirin.  ED Course:  Initial vitals showed BP 173/115, pulse 85, RR 21, temp 97.6 Fahrenheit, SPO2 98% on room air.  Labs show sodium 139, potassium 3.7, bicarb 26, BUN 14, creatinine 1.29, serum glucose 126, LFTs within normal limits, WBC 9.2, hemoglobin 13.7, platelets 277,000, serum ethanol <10, INR 1.0.  CT head without contrast was negative for acute intracranial hemorrhage or evidence of acute  infarct.  Neurology were consulted and recommended further stroke work-up. He was not considered a TPA candidate as he was outside of the window.  MRI brain without contrast, MRA head/neck showed an acute subcentimeter infarction affecting the left lateral thalamus/posterior limb internal capsule. Extensive chronic small vessel ischemic changes were seen throughout the brain. No significant carotid bifurcation disease. Intracranial atherosclerotic disease also seen with focal stenosis at the left MCA bifurcation.  The hospitalist service was consulted to admit for further evaluation and management.  Review of Systems: All systems reviewed and are negative except as documented in history of present illness above.   Past Medical History:  Diagnosis Date  . Abnormal EKG 03/09/2013  . Abnormal nuclear cardiac imaging test 11/14/2013  . Acute lacunar infarction (HCC) 02/06/2019  . Aortic atherosclerosis (HCC) 07/07/2017  . Arthritis    "all over" (09/30/2017)  . Ascending aortic aneurysm (HCC)    cMRI (2/15): Normal LV EF 54%, Mild BAE, Trileaflet Aortic Valve, Upper limits of normal ascending aorta 3.8 cm, Normal aortic arch 2.3 cm with bovine origin of left carotid  . Atrial flutter (HCC)    Post-op with no reoccurence  . Chronic diastolic heart failure (HCC)    Echo (1/15): Left ventricle: The cavity size was mildly dilated. Mild LVH. EF 55% to 60%. Wall motion was normal; Gr 1 diastolic dysfunction Left atrium: The atrium was mildly dilated. Right atrium: The atrium was mildly to moderately dilated. Aortic arch measures 4.8  cm (moderately dilated); suggest CTA or MRA to better assess.  . Chronic systolic CHF (congestive heart failure) (HCC) 07/24/2017  . CKD (chronic kidney disease), stage III (HCC) 07/24/2017  . Coronary artery disease 10/2011   a. s/p cabg;  b. Myoview (9/15):  ant ischemia, poss TID, EF 43%, high risk >> LHC (9/15): Dist LM 50, pLAD 90 then 100, pCFX 50, mCFX 75, oRCA 75,  dRCA 95, L-LAD patent, S-Dx 100, S-OM1 patent, S-PDA patent, EF 55% >> native Dx small caliber and not well suited for PCI; native RCA amenable to PCI and would restore flow to PLA branches but would jeopardize SVG-RCA - Med Rx  . Deviated nasal septum   . Dilated aortic root (HCC)   . Diverticulitis of sigmoid colon 07/24/2017  . DM (diabetes mellitus), type 2, uncontrolled with complications (HCC)    onset 2013  . Dyslipidemia   . Edema 02/09/2013  . GERD (gastroesophageal reflux disease)   . Hx of Doppler ultrasound    Carotid US (9/13): No ICA stenosis  . Hypertension   . Pleural thickening 07/24/2017    Past Surgical History:  Procedure Laterality Date  . CORONARY ARTERY BYPASS GRAFT  10/27/2011   Procedure: CORONARY ARTERY BYPASS GRAFTING (CABG);  Surgeon: Kerin Perna, MD;  Location: [facility];  Service: Open Heart Surgery;  Laterality: N/A;  Coronary Artery Bypass Grafting times four using left internal mammary artery and right greater saphenous vein endoscopically harvested  . CORONARY STENT INTERVENTION N/A 09/30/2017   Procedure: CORONARY STENT INTERVENTION;  Surgeon: Corky Crafts, MD;  Location: [facility] INVASIVE CV LAB;  Service: Cardiovascular;  Laterality: N/A;  . KNEE ARTHROSCOPY Left 2003  . KNEE ARTHROSCOPY Right 2006  . LEFT HEART CATHETERIZATION WITH CORONARY ANGIOGRAM N/A 10/23/2011   Procedure: LEFT HEART CATHETERIZATION WITH CORONARY ANGIOGRAM;  Surgeon: Quintella Reichert, MD;  Location: [facility] CATH LAB;  Service: Cardiovascular;  Laterality: N/A;  . LEFT HEART CATHETERIZATION WITH CORONARY ANGIOGRAM N/A 11/16/2013   Procedure: LEFT HEART CATHETERIZATION WITH CORONARY ANGIOGRAM;  Surgeon: Micheline Chapman, MD;  Location: [facility] CATH LAB;  Service: Cardiovascular;  Laterality: N/A;  . RIGHT/LEFT HEART CATH AND CORONARY/GRAFT ANGIOGRAPHY N/A 09/30/2017   Procedure: RIGHT/LEFT HEART CATH AND CORONARY/GRAFT ANGIOGRAPHY;  Surgeon: Corky Crafts, MD;  Location: [facility] INVASIVE CV LAB;   Service: Cardiovascular;  Laterality: N/A;    Social History:  reports that he has never smoked. He has never used smokeless tobacco. He reports current alcohol use. He reports that he does not use drugs.  Allergies  Allergen Reactions  . Lisinopril Cough    Patient can take lisinopril now    Family History  Problem Relation Age of Onset  . Hypertension Mother   . Stroke Father 79  . Hypertension Father   . Hypertension Paternal Grandfather   . Heart attack Neg Hx      Prior to Admission medications   Medication Sig Start Date End Date Taking? Authorizing Provider  acetaminophen (TYLENOL) 500 MG tablet Take 1,000 mg by mouth every 6 (six) hours as needed for headache.    [provider]  aspirin 81 MG chewable tablet Chew 81 mg by mouth daily.    [provider]  atorvastatin (LIPITOR) 80 MG tablet Take 1 tablet (80 mg total) by mouth daily. 09/14/19 10/14/19  Briant Cedar, MD  cholecalciferol (VITAMIN D) 1000 units tablet Take 1,000 Units by mouth daily.    [provider]  clopidogrel (PLAVIX) 75 MG tablet  Take 75 mg by mouth daily.    [provider]  ezetimibe (ZETIA) 10 MG tablet TAKE 1 TABLET (10 MG TOTAL) BY MOUTH DAILY. PLEASE MAKE OVERDUE APPT WITH DR. Mayford Knife BEFORE ANYMORE REFILLS. Patient taking differently: Take 10 mg by mouth daily.  09/08/19   Quintella Reichert, MD  fluticasone (FLONASE) 50 MCG/ACT nasal spray Place 1 spray into both nostrils daily.    [provider]  insulin aspart (NOVOLOG) 100 UNIT/ML injection Inject 8 Units into the skin See admin instructions. Three times a day with meals and as needed using sliding scale.    [provider]  iron polysaccharides (NIFEREX) 150 MG capsule Take 1 capsule (150 mg total) by mouth daily. 07/30/17   Lenox Ponds, MD  isosorbide mononitrate (IMDUR) 60 MG 24 hr tablet TAKE 1 TABLET (60 MG TOTAL) BY MOUTH DAILY. Patient taking differently: Take 60 mg by  mouth daily.  09/08/19   Quintella Reichert, MD  metFORMIN (GLUCOPHAGE) 1000 MG tablet Take 1,000 mg by mouth daily with breakfast.    [provider]  metoprolol succinate (TOPROL-XL) 50 MG 24 hr tablet Take 1 tablet (50 mg total) by mouth daily. Take with or immediately following a meal. 09/14/19 10/14/19  Briant Cedar, MD  Multiple Vitamin (MULTIVITAMIN WITH MINERALS) TABS tablet Take 1 tablet by mouth daily.    [provider]  nitroGLYCERIN (NITROSTAT) 0.4 MG SL tablet Place 1 tablet (0.4 mg total) under the tongue every 5 (five) minutes as needed for chest pain. 10/01/17 09/08/28  Kroeger, Ovidio Kin., PA-C  Omega-3 1000 MG CAPS Take 2,000 mg by mouth daily.    [provider]  polyethylene glycol (MIRALAX) packet Take 17 g by mouth daily. 07/30/17   Lenox Ponds, MD  potassium chloride (KLOR-CON) 10 MEQ tablet TAKE 1 TABLET (10 MEQ TOTAL) BY MOUTH SEE ADMIN INSTRUCTIONS. PLEASE MAKE APT FOR FUTURE REFILLS 1ST ATTEMPT [phone]. Patient taking differently: Take 10 mEq by mouth daily.  09/08/19   Quintella Reichert, MD  pramipexole (MIRAPEX) 0.75 MG tablet Take 1.5 mg by mouth every evening.     [provider]  Semaglutide,0.25 or 0.5MG /DOS, (OZEMPIC, 0.25 OR 0.5 MG/DOSE,) 2 MG/1.5ML SOPN Inject 1 mg into the skin every Sunday.     [provider]  torsemide (DEMADEX) 20 MG tablet Take 1 tablet (20 mg total) by mouth daily. 09/13/19   Briant Cedar, MD  TOUJEO SOLOSTAR 300 UNIT/ML SOPN Inject 22 Units into the skin at bedtime.  01/04/18   [provider]    Physical Exam: Vitals:   11/21/19 1537 11/21/19 1543  BP: (!) 173/115   Pulse: 84   Resp: (!) 21   Temp: 97.6 F (36.4 C)   TempSrc: Oral   SpO2: 98%   Weight:  108.9 kg  Height:  5\' 9"  (1.753 m)   Constitutional: Obese man resting in bed with head elevated, NAD, calm, comfortable Eyes: PERRL, lids and conjunctivae normal ENMT: Mucous membranes are dry. Posterior  pharynx clear of any exudate or lesions.Normal dentition.  Neck: normal, supple, no masses. Respiratory: clear to auscultation bilaterally, no wheezing, no crackles. Normal respiratory effort. No accessory muscle use.  Cardiovascular: Regular rate and rhythm, no murmurs / rubs / gallops. No extremity edema. 2+ pedal pulses. Abdomen: no tenderness, no masses palpated. No hepatosplenomegaly. Bowel sounds positive.  Musculoskeletal: no clubbing / cyanosis. No joint deformity upper and lower extremities.  Diminished range of motion right  upper extremity otherwise good ROM all other extremities, no contractures. Normal muscle tone.  Skin: Abrasion to right elbow which is dressed with surrounding dried blood. Neurologic: Right facial droop present otherwise CN 2-12 grossly intact. Sensation intact, Strength 4/5 RUE otherwise 5/5 lower extremities. Psychiatric: Normal judgment and insight. Alert and oriented x 3. Normal mood.   Labs on Admission: I have personally reviewed following labs and imaging studies  CBC: Recent Labs  Lab 11/21/19 1440 11/21/19 1450  WBC 9.2  --   NEUTROABS 6.9  --   HGB 13.7 13.9  HCT 40.8 41.0  MCV 85.9  --   PLT 277  --    Basic Metabolic Panel: Recent Labs  Lab 11/21/19 1440 11/21/19 1450  NA 139 141  K 3.7 3.7  CL 103 101  CO2 26  --   GLUCOSE 126* 124*  BUN 14 15  CREATININE 1.29* 1.30*  CALCIUM 9.4  --    GFR: Estimated Creatinine Clearance: 60.6 mL/min (A) (by C-G formula based on SCr of 1.3 mg/dL (H)). Liver Function Tests: Recent Labs  Lab 11/21/19 1440  AST 21  ALT 19  ALKPHOS 82  BILITOT 0.7  PROT 7.3  ALBUMIN 3.8   No results for input(s): LIPASE, AMYLASE in the last 168 hours. No results for input(s): AMMONIA in the last 168 hours. Coagulation Profile: Recent Labs  Lab 11/21/19 1440  INR 1.0   Cardiac Enzymes: No results for input(s): CKTOTAL, CKMB, CKMBINDEX, TROPONINI in the last 168 hours. BNP (last 3 results) No results  for input(s): PROBNP in the last 8760 hours. HbA1C: No results for input(s): HGBA1C in the last 72 hours. CBG: Recent Labs  Lab 11/21/19 1442  GLUCAP 114*   Lipid Profile: No results for input(s): CHOL, HDL, LDLCALC, TRIG, CHOLHDL, LDLDIRECT in the last 72 hours. Thyroid Function Tests: No results for input(s): TSH, T4TOTAL, FREET4, T3FREE, THYROIDAB in the last 72 hours. Anemia Panel: No results for input(s): VITAMINB12, FOLATE, FERRITIN, TIBC, IRON, RETICCTPCT in the last 72 hours. Urine analysis:    Component Value Date/Time   COLORURINE YELLOW 09/09/2019 0628   APPEARANCEUR CLEAR 09/09/2019 0628   LABSPEC 1.010 09/09/2019 0628   PHURINE 6.0 09/09/2019 0628   GLUCOSEU NEGATIVE 09/09/2019 0628   HGBUR NEGATIVE 09/09/2019 0628   BILIRUBINUR NEGATIVE 09/09/2019 0628   KETONESUR NEGATIVE 09/09/2019 0628   PROTEINUR 100 (A) 09/09/2019 0628   UROBILINOGEN 1.0 10/24/2011 1742   NITRITE NEGATIVE 09/09/2019 0628   LEUKOCYTESUR NEGATIVE 09/09/2019 0628    Radiological Exams on Admission: MR ANGIO HEAD WO CONTRAST  Result Date: 11/21/2019 CLINICAL DATA:  Follow-up stroke presentation. EXAM: MRI HEAD WITHOUT AND WITH CONTRAST MRA HEAD WITHOUT CONTRAST MRA NECK WITHOUT AND WITH CONTRAST TECHNIQUE: Multiplanar, multiecho pulse sequences of the brain and surrounding structures were obtained without and with intravenous contrast. Angiographic images of the Circle of Willis were obtained using MRA technique without intravenous contrast. Angiographic images of the neck were obtained using MRA technique without and with intravenous contrast. Carotid stenosis measurements (when applicable) are obtained utilizing NASCET criteria, using the distal internal carotid diameter as the denominator. CONTRAST:  10mL GADAVIST GADOBUTROL 1 MMOL/ML IV SOLN COMPARISON:  CT studies done yesterday.  MRI 02/06/2019 FINDINGS: MRI HEAD FINDINGS Brain: Diffusion imaging shows a subcentimeter region of acute infarction  affecting the left lateral thalamus/posterior limb internal capsule. Old small vessel ischemic changes affect the pons. Old small vessel cerebellar infarctions. Old small vessel infarctions affect both thalami, both basal ganglia regions  and are present throughout the deep and subcortical white matter of both hemispheres. No large vessel territory infarction. No mass lesion, acute hemorrhage, hydrocephalus or extra-axial collection. Vascular: Major vessels at the base of the brain show flow. Skull and upper cervical spine: Negative Sinuses/Orbits: Clear/normal Other: None MRA HEAD FINDINGS Both internal carotid arteries widely patent through the skull base and siphon regions. The anterior and middle cerebral vessels are patent. There is stenosis at the left MCA bifurcation, particularly affecting the inferior division. Multiple stenoses in the more peripheral branch vessels. Both vertebral arteries are patent through the foramen magnum. The right terminates in PICA. The left vertebral artery supplies the basilar. No basilar stenosis. Posterior circulation branch vessels show flow. Primary fetal origin of the left PCA. Atherosclerotic narrowing the more distal branch vessels. MRA NECK FINDINGS Branching pattern is normal without origin stenosis. Both common carotid arteries are widely patent to the bifurcation. Both carotid bifurcations are widely patent. No stenosis or irregularity. Both cervical internal carotid arteries show flow. Both vertebral arteries are patent with the left being dominant. IMPRESSION: 1. Acute subcentimeter infarction affecting the left lateral thalamus/posterior limb internal capsule. 2. Extensive chronic small-vessel ischemic changes throughout the brain as outlined above. 3. No significant carotid bifurcation disease. 4. Intracranial atherosclerotic disease. Atherosclerotic narrowing and irregularity of the more distal branch vessels diffusely. Focal stenosis at the left MCA bifurcation,  particularly affecting the inferior division. Electronically Signed   By: Paulina FusiMark  Shogry M.D.   On: 11/21/2019 17:30   MR ANGIO NECK W WO CONTRAST  Result Date: 11/21/2019 CLINICAL DATA:  Follow-up stroke presentation. EXAM: MRI HEAD WITHOUT AND WITH CONTRAST MRA HEAD WITHOUT CONTRAST MRA NECK WITHOUT AND WITH CONTRAST TECHNIQUE: Multiplanar, multiecho pulse sequences of the brain and surrounding structures were obtained without and with intravenous contrast. Angiographic images of the Circle of Willis were obtained using MRA technique without intravenous contrast. Angiographic images of the neck were obtained using MRA technique without and with intravenous contrast. Carotid stenosis measurements (when applicable) are obtained utilizing NASCET criteria, using the distal internal carotid diameter as the denominator. CONTRAST:  10mL GADAVIST GADOBUTROL 1 MMOL/ML IV SOLN COMPARISON:  CT studies done yesterday.  MRI 02/06/2019 FINDINGS: MRI HEAD FINDINGS Brain: Diffusion imaging shows a subcentimeter region of acute infarction affecting the left lateral thalamus/posterior limb internal capsule. Old small vessel ischemic changes affect the pons. Old small vessel cerebellar infarctions. Old small vessel infarctions affect both thalami, both basal ganglia regions and are present throughout the deep and subcortical white matter of both hemispheres. No large vessel territory infarction. No mass lesion, acute hemorrhage, hydrocephalus or extra-axial collection. Vascular: Major vessels at the base of the brain show flow. Skull and upper cervical spine: Negative Sinuses/Orbits: Clear/normal Other: None MRA HEAD FINDINGS Both internal carotid arteries widely patent through the skull base and siphon regions. The anterior and middle cerebral vessels are patent. There is stenosis at the left MCA bifurcation, particularly affecting the inferior division. Multiple stenoses in the more peripheral branch vessels. Both vertebral  arteries are patent through the foramen magnum. The right terminates in PICA. The left vertebral artery supplies the basilar. No basilar stenosis. Posterior circulation branch vessels show flow. Primary fetal origin of the left PCA. Atherosclerotic narrowing the more distal branch vessels. MRA NECK FINDINGS Branching pattern is normal without origin stenosis. Both common carotid arteries are widely patent to the bifurcation. Both carotid bifurcations are widely patent. No stenosis or irregularity. Both cervical internal carotid arteries show flow. Both vertebral  arteries are patent with the left being dominant. IMPRESSION: 1. Acute subcentimeter infarction affecting the left lateral thalamus/posterior limb internal capsule. 2. Extensive chronic small-vessel ischemic changes throughout the brain as outlined above. 3. No significant carotid bifurcation disease. 4. Intracranial atherosclerotic disease. Atherosclerotic narrowing and irregularity of the more distal branch vessels diffusely. Focal stenosis at the left MCA bifurcation, particularly affecting the inferior division. Electronically Signed   By: Paulina Fusi M.D.   On: 11/21/2019 17:30   MR BRAIN WO CONTRAST  Result Date: 11/21/2019 CLINICAL DATA:  Follow-up stroke presentation. EXAM: MRI HEAD WITHOUT AND WITH CONTRAST MRA HEAD WITHOUT CONTRAST MRA NECK WITHOUT AND WITH CONTRAST TECHNIQUE: Multiplanar, multiecho pulse sequences of the brain and surrounding structures were obtained without and with intravenous contrast. Angiographic images of the Circle of Willis were obtained using MRA technique without intravenous contrast. Angiographic images of the neck were obtained using MRA technique without and with intravenous contrast. Carotid stenosis measurements (when applicable) are obtained utilizing NASCET criteria, using the distal internal carotid diameter as the denominator. CONTRAST:  10mL GADAVIST GADOBUTROL 1 MMOL/ML IV SOLN COMPARISON:  CT studies  done yesterday.  MRI 02/06/2019 FINDINGS: MRI HEAD FINDINGS Brain: Diffusion imaging shows a subcentimeter region of acute infarction affecting the left lateral thalamus/posterior limb internal capsule. Old small vessel ischemic changes affect the pons. Old small vessel cerebellar infarctions. Old small vessel infarctions affect both thalami, both basal ganglia regions and are present throughout the deep and subcortical white matter of both hemispheres. No large vessel territory infarction. No mass lesion, acute hemorrhage, hydrocephalus or extra-axial collection. Vascular: Major vessels at the base of the brain show flow. Skull and upper cervical spine: Negative Sinuses/Orbits: Clear/normal Other: None MRA HEAD FINDINGS Both internal carotid arteries widely patent through the skull base and siphon regions. The anterior and middle cerebral vessels are patent. There is stenosis at the left MCA bifurcation, particularly affecting the inferior division. Multiple stenoses in the more peripheral branch vessels. Both vertebral arteries are patent through the foramen magnum. The right terminates in PICA. The left vertebral artery supplies the basilar. No basilar stenosis. Posterior circulation branch vessels show flow. Primary fetal origin of the left PCA. Atherosclerotic narrowing the more distal branch vessels. MRA NECK FINDINGS Branching pattern is normal without origin stenosis. Both common carotid arteries are widely patent to the bifurcation. Both carotid bifurcations are widely patent. No stenosis or irregularity. Both cervical internal carotid arteries show flow. Both vertebral arteries are patent with the left being dominant. IMPRESSION: 1. Acute subcentimeter infarction affecting the left lateral thalamus/posterior limb internal capsule. 2. Extensive chronic small-vessel ischemic changes throughout the brain as outlined above. 3. No significant carotid bifurcation disease. 4. Intracranial atherosclerotic disease.  Atherosclerotic narrowing and irregularity of the more distal branch vessels diffusely. Focal stenosis at the left MCA bifurcation, particularly affecting the inferior division. Electronically Signed   By: Paulina Fusi M.D.   On: 11/21/2019 17:30   CT HEAD CODE STROKE WO CONTRAST  Result Date: 11/21/2019 CLINICAL DATA:  Code stroke. EXAM: CT HEAD WITHOUT CONTRAST TECHNIQUE: Contiguous axial images were obtained from the base of the skull through the vertex without intravenous contrast. COMPARISON:  09/09/2019 FINDINGS: Brain: No acute intracranial hemorrhage, mass effect, or edema. No new loss of gray-white differentiation. Multiple chronic small vessel infarcts are again identified involving the central white matter, basal ganglia, thalamus, and cerebellum. Ventricles are stable in size. Additional patchy and confluent areas of hypoattenuation in the supratentorial white matter are nonspecific but probably  reflects stable chronic microvascular ischemic changes. Vascular: No hyperdense vessel. There is intracranial atherosclerotic calcification at the skull base. Skull: Unremarkable. Sinuses/Orbits: Head mild paranasal sinus mucosal thickening. No significant orbital abnormality. Other: Mastoid air cells are clear. ASPECTS ([place] Stroke Program Early CT Score) - Ganglionic level infarction (caudate, lentiform nuclei, internal capsule, insula, M1-M3 cortex): 7 - Supraganglionic infarction (M4-M6 cortex): 3 Total score (0-10 with 10 being normal): 10 IMPRESSION: No acute intracranial hemorrhage or evidence of acute infarction. ASPECT score is 10. Stable chronic findings detailed above. These results were communicated to Dr. Otelia Limes at 2:57 pmon 10/4/2021by text page via the [facility] messaging system. Electronically Signed   By: Guadlupe Spanish M.D.   On: 11/21/2019 14:59    EKG: Independently reviewed. Normal sinus rhythm with PVC. PVC new when compared to prior.  Assessment/Plan Principal Problem:   Acute  CVA (cerebrovascular accident) (HCC) Active Problems:   Hypertension associated with diabetes (HCC)   Coronary artery disease   DM (diabetes mellitus), type 2, uncontrolled with complications (HCC)   Chronic systolic CHF (congestive heart failure) (HCC)   CKD (chronic kidney disease), stage III (HCC)   Hyperlipidemia associated with type 2 diabetes mellitus (HCC)  James [name] is a 74 y.o. male with medical history significant for CAD s/p CABG, history of CVA with cryptogenic stroke in December 2020, chronic systolic CHF, CKD stage III, type 2 diabetes, hypertension, hyperlipidemia, and recurrent falls who is admitted for acute CVA.  Acute CVA: Acute infarct affecting left lateral thalamus/posterior limb internal capsule seen on MRI brain.  MRA head/neck show extensive chronic small vessel ischemia throughout and focal stenosis at the left MCA bifurcation.  Has continued RUE weakness. -Resume aspirin 81 mg daily and Plavix 75 mg daily -Echocardiogram -Continue atorvastatin 80 mg daily -Check A1c, lipid panel -Monitor on telemetry, continue neurochecks -PT/OT/SLP eval -Allow permissive hypertension for now up to 220/110 -Neurology following  Insulin-dependent type 2 diabetes: Placed on reduced home dose insulin glargine 12 units nightly, add sensitive SSI.  Holding home Metformin.  CAD s/p CABG: Denies chest pain.  Troponin minimally elevated at 33.  No acute EKG changes.  Likely mild demand ischemia in setting of acute CVA. -Continue atorvastatin/Zetia -Resume home Imdur tomorrow -Toprol-XL on hold for now  Hypertension: Holding home lisinopril, Toprol-XL, to allow for permissive hypertension as above.  Hyperlipidemia: Continue atorvastatin and Zetia.  Chronic systolic CHF: Last TTE 09/11/2019 showed EF 45-50% with global LV hypokinesis.  Appears euvolemic on admission. -Resume home torsemide tomorrow -Holding lisinopril and Toprol-XL for now to allow for permissive  hypertension -Monitor strict I/O's and daily weights  CKD stage III: Chronic and stable.  Renal function improved compared to most recent labs.  DVT prophylaxis: Lovenox Code Status: DNR, confirmed with patient Family Communication: Discussed with patient, he has discussed with his close contacts Disposition Plan: From home, dispo pending further stroke work-up and PT/OT/SLP eval Consults called: Neurology Admission status:  Status is: Inpatient  Remains inpatient appropriate because:Unsafe d/c plan   Dispo: The patient is from: Home              Anticipated d/c is to: Home versus SNF              Anticipated d/c date is: 2 days              Patient currently is not medically stable to d/c.   Darreld Mclean MD [facility]  If 7PM-7AM, please contact night-coverage [email or web address]  11/21/2019, 7:27 PM

## 2019-11-21 NOTE — ED Provider Notes (Signed)
[facility] EMERGENCY DEPARTMENT Provider Note   CSN: [id number] Arrival date & time: 11/21/19  1436     History No chief complaint on file.   James [name] is a 74 y.o. male.  HPI    This patient is a 74 year old male with a history of prior strokes, history of atrial flutter, he is anticoagulated with Plavix, he has a known history of diabetes dyslipidemia and hypertension.  Apparently the patient was in his usual state of health until couple weeks ago, he had fallen in the driveway and laid all night long in the driveway injuring his left arm, he eventually was able to get up and got back to normal, he does not know why he fell at that time.  He states that this morning he had another fall, when the paramedics came to help him up they found him to be very hypertensive measuring at 240 systolic, they recommended evaluation, the patient states he took his home blood pressure medications and was not transported to the hospital.  At around 10:00 the patient had another fall, when paramedics arrived back in the house they found the patient had some right-sided deficits.  He comes [facility] on a [facility] truck.  It is unclear when he was last normal, the patient does not remember being normal at 10:00 he does remembers feeling weak on the right side.  He denies chest pain or shortness of breath, denies fevers or chills, denies vomiting or diarrhea but he does endorse a feeling of dizziness that is making him nauseated.  Past Medical History:  Diagnosis Date  . Abnormal EKG 03/09/2013  . Abnormal nuclear cardiac imaging test 11/14/2013  . Acute lacunar infarction (HCC) 02/06/2019  . Aortic atherosclerosis (HCC) 07/07/2017  . Arthritis    "all over" (09/30/2017)  . Ascending aortic aneurysm (HCC)    cMRI (2/15): Normal LV EF 54%, Mild BAE, Trileaflet Aortic Valve, Upper limits of normal ascending aorta 3.8 cm, Normal aortic arch 2.3 cm with bovine origin  of left carotid  . Atrial flutter (HCC)    Post-op with no reoccurence  . Chronic diastolic heart failure (HCC)    Echo (1/15): Left ventricle: The cavity size was mildly dilated. Mild LVH. EF 55% to 60%. Wall motion was normal; Gr 1 diastolic dysfunction Left atrium: The atrium was mildly dilated. Right atrium: The atrium was mildly to moderately dilated. Aortic arch measures 4.8 cm (moderately dilated); suggest CTA or MRA to better assess.  . Chronic systolic CHF (congestive heart failure) (HCC) 07/24/2017  . CKD (chronic kidney disease), stage III 07/24/2017  . Coronary artery disease 10/2011   a. s/p cabg;  b. Myoview (9/15):  ant ischemia, poss TID, EF 43%, high risk >> LHC (9/15): Dist LM 50, pLAD 90 then 100, pCFX 50, mCFX 75, oRCA 75, dRCA 95, L-LAD patent, S-Dx 100, S-OM1 patent, S-PDA patent, EF 55% >> native Dx small caliber and not well suited for PCI; native RCA amenable to PCI and would restore flow to PLA branches but would jeopardize SVG-RCA - Med Rx  . Deviated nasal septum   . Dilated aortic root (HCC)   . Diverticulitis of sigmoid colon 07/24/2017  . DM (diabetes mellitus), type 2, uncontrolled with complications (HCC)    onset 2013  . Dyslipidemia   . Edema 02/09/2013  . GERD (gastroesophageal reflux disease)   . Hx of Doppler ultrasound    Carotid US (9/13): No ICA stenosis  . Hypertension   .  Pleural thickening 07/24/2017    Patient Active Problem List   Diagnosis Date Noted  . Acute CHF (congestive heart failure) (HCC) 09/09/2019  . CHF (congestive heart failure) (HCC) 09/09/2019  . Cryptogenic stroke (HCC) 04/27/2019  . Left sided numbness 02/06/2019  . Acute lacunar infarction (HCC) 02/06/2019  . Obesity, Class II, BMI 35-39.9 02/06/2019  . Dyslipidemia   . Ischemic cardiomyopathy 10/12/2017  . Mild pulmonary hypertension (HCC) 10/12/2017  . S/P CABG (coronary artery bypass graft) 10/01/2017  . Status post coronary artery stent placement   . Left  ventricular systolic dysfunction 09/30/2017  . Diverticulitis of sigmoid colon 07/24/2017  . Chronic systolic CHF (congestive heart failure) (HCC) 07/24/2017  . CKD (chronic kidney disease), stage III (HCC) 07/24/2017  . Pleural thickening 07/24/2017  . Aortic atherosclerosis (HCC) 07/07/2017  . Abnormal nuclear cardiac imaging test 11/14/2013  . Abnormal EKG 03/09/2013  . Dilated aortic root (HCC)   . Edema 02/09/2013  . Hypertension 10/24/2011  . Coronary artery disease 10/24/2011  . Hyperlipidemia 10/24/2011  . DM (diabetes mellitus), type 2, uncontrolled with complications (HCC) 10/24/2011  . GERD (gastroesophageal reflux disease) 10/24/2011    Past Surgical History:  Procedure Laterality Date  . CORONARY ARTERY BYPASS GRAFT  10/27/2011   Procedure: CORONARY ARTERY BYPASS GRAFTING (CABG);  Surgeon: Kerin Perna, MD;  Location: [facility] OR;  Service: Open Heart Surgery;  Laterality: N/A;  Coronary Artery Bypass Grafting times four using left internal mammary artery and right greater saphenous vein endoscopically harvested  . CORONARY STENT INTERVENTION N/A 09/30/2017   Procedure: CORONARY STENT INTERVENTION;  Surgeon: Corky Crafts, MD;  Location: [facility] INVASIVE CV LAB;  Service: Cardiovascular;  Laterality: N/A;  . KNEE ARTHROSCOPY Left 2003  . KNEE ARTHROSCOPY Right 2006  . LEFT HEART CATHETERIZATION WITH CORONARY ANGIOGRAM N/A 10/23/2011   Procedure: LEFT HEART CATHETERIZATION WITH CORONARY ANGIOGRAM;  Surgeon: Quintella Reichert, MD;  Location: [facility] CATH LAB;  Service: Cardiovascular;  Laterality: N/A;  . LEFT HEART CATHETERIZATION WITH CORONARY ANGIOGRAM N/A 11/16/2013   Procedure: LEFT HEART CATHETERIZATION WITH CORONARY ANGIOGRAM;  Surgeon: Micheline Chapman, MD;  Location: [facility] CATH LAB;  Service: Cardiovascular;  Laterality: N/A;  . RIGHT/LEFT HEART CATH AND CORONARY/GRAFT ANGIOGRAPHY N/A 09/30/2017   Procedure: RIGHT/LEFT HEART CATH AND CORONARY/GRAFT ANGIOGRAPHY;  Surgeon: Corky Crafts, MD;  Location: [facility] INVASIVE CV LAB;  Service: Cardiovascular;  Laterality: N/A;       Family History  Problem Relation Age of Onset  . Hypertension Mother   . Stroke Father 96  . Hypertension Father   . Hypertension Paternal Grandfather   . Heart attack Neg Hx     Social History   Tobacco Use  . Smoking status: Never Smoker  . Smokeless tobacco: Never Used  Vaping Use  . Vaping Use: Never used  Substance Use Topics  . Alcohol use: Yes    Comment: 09/30/2017 "1 beer a month or less"  . Drug use: Never    Home Medications Prior to Admission medications   Medication Sig Start Date End Date Taking? Authorizing Provider  acetaminophen (TYLENOL) 500 MG tablet Take 1,000 mg by mouth every 6 (six) hours as needed for headache.    [provider]  aspirin 81 MG chewable tablet Chew 81 mg by mouth daily.    [provider]  atorvastatin (LIPITOR) 80 MG tablet Take 1 tablet (80 mg total) by mouth daily. 09/14/19 10/14/19  Briant Cedar, MD  cholecalciferol (VITAMIN D) 1000 units  tablet Take 1,000 Units by mouth daily.    [provider]  clopidogrel (PLAVIX) 75 MG tablet Take 75 mg by mouth daily.    [provider]  ezetimibe (ZETIA) 10 MG tablet TAKE 1 TABLET (10 MG TOTAL) BY MOUTH DAILY. PLEASE MAKE OVERDUE APPT WITH DR. Mayford KnifeURNER BEFORE ANYMORE REFILLS. Patient taking differently: Take 10 mg by mouth daily.  09/08/19   Quintella Reicherturner, Traci R, MD  fluticasone (FLONASE) 50 MCG/ACT nasal spray Place 1 spray into both nostrils daily.    [provider]  insulin aspart (NOVOLOG) 100 UNIT/ML injection Inject 8 Units into the skin See admin instructions. Three times a day with meals and as needed using sliding scale.    [provider]  iron polysaccharides (NIFEREX) 150 MG capsule Take 1 capsule (150 mg total) by mouth daily. 07/30/17   Lenox PondsSilva Zapata, Edwin, MD  isosorbide mononitrate (IMDUR) 60 MG 24 hr tablet TAKE 1 TABLET (60 MG  TOTAL) BY MOUTH DAILY. Patient taking differently: Take 60 mg by mouth daily.  09/08/19   Quintella Reicherturner, Traci R, MD  metFORMIN (GLUCOPHAGE) 1000 MG tablet Take 1,000 mg by mouth daily with breakfast.    [provider]  metoprolol succinate (TOPROL-XL) 50 MG 24 hr tablet Take 1 tablet (50 mg total) by mouth daily. Take with or immediately following a meal. 09/14/19 10/14/19  Briant CedarEzenduka, Nkeiruka J, MD  Multiple Vitamin (MULTIVITAMIN WITH MINERALS) TABS tablet Take 1 tablet by mouth daily.    [provider]  nitroGLYCERIN (NITROSTAT) 0.4 MG SL tablet Place 1 tablet (0.4 mg total) under the tongue every 5 (five) minutes as needed for chest pain. 10/01/17 09/08/28  Kroeger, Ovidio KinKrista M., PA-C  Omega-3 1000 MG CAPS Take 2,000 mg by mouth daily.    [provider]  polyethylene glycol (MIRALAX) packet Take 17 g by mouth daily. 07/30/17   Lenox PondsSilva Zapata, Edwin, MD  potassium chloride (KLOR-CON) 10 MEQ tablet TAKE 1 TABLET (10 MEQ TOTAL) BY MOUTH SEE ADMIN INSTRUCTIONS. PLEASE MAKE APT FOR FUTURE REFILLS 1ST ATTEMPT [phone]. Patient taking differently: Take 10 mEq by mouth daily.  09/08/19   Quintella Reicherturner, Traci R, MD  pramipexole (MIRAPEX) 0.75 MG tablet Take 1.5 mg by mouth every evening.     [provider]  Semaglutide,0.25 or 0.5MG /DOS, (OZEMPIC, 0.25 OR 0.5 MG/DOSE,) 2 MG/1.5ML SOPN Inject 1 mg into the skin every Sunday.     [provider]  torsemide (DEMADEX) 20 MG tablet Take 1 tablet (20 mg total) by mouth daily. 09/13/19   Briant CedarEzenduka, Nkeiruka J, MD  TOUJEO SOLOSTAR 300 UNIT/ML SOPN Inject 22 Units into the skin at bedtime.  01/04/18   [provider]    Allergies    Lisinopril  Review of Systems   Review of Systems  All other systems reviewed and are negative.   Physical Exam Updated Vital Signs There were no vitals taken for this visit.  Physical Exam Vitals and nursing note reviewed.  Constitutional:      General: He is not in acute distress.     Appearance: He is well-developed.  HENT:     Head: Normocephalic and atraumatic.     Mouth/Throat:     Pharynx: No oropharyngeal exudate.  Eyes:     General: No scleral icterus.       Right eye: No discharge.        Left eye: No discharge.     Conjunctiva/sclera: Conjunctivae normal.     Pupils: Pupils are equal,  round, and reactive to light.  Neck:     Thyroid: No thyromegaly.     Vascular: No JVD.     Comments: No carotid bruit on auscultation Cardiovascular:     Rate and Rhythm: Normal rate and regular rhythm.     Heart sounds: Normal heart sounds. No murmur heard.  No friction rub. No gallop.   Pulmonary:     Effort: Pulmonary effort is normal. No respiratory distress.     Breath sounds: Normal breath sounds. No wheezing or rales.  Abdominal:     General: Bowel sounds are normal. There is no distension.     Palpations: Abdomen is soft. There is no mass.     Tenderness: There is no abdominal tenderness.  Musculoskeletal:        General: No tenderness.     Cervical back: Normal range of motion and neck supple.     Right lower leg: Edema present.     Left lower leg: Edema present.     Comments: Mild bilateral lower extremity edema which is symmetrical  Lymphadenopathy:     Cervical: No cervical adenopathy.  Skin:    General: Skin is warm and dry.     Findings: No erythema or rash.     Comments: Hyperpigmentation of the bilateral shins  Neurological:     Mental Status: He is alert.     Coordination: Coordination normal.     Comments: Subtle right-sided facial droop, right arm and leg weakness which is 4 out of 5, dysmetria with the right arm.  Speech is clear, cranial nerves III through XII are normal, peripheral visual fields are normal.  He has a normal level of alertness.  Psychiatric:        Behavior: Behavior normal.     ED Results / Procedures / Treatments   Labs (all labs ordered are listed, but only abnormal results are displayed) Labs Reviewed  I-STAT CHEM  8, ED - Abnormal; Notable for the following components:      Result Value   Creatinine, Ser 1.30 (*)    Glucose, Bld 124 (*)    All other components within normal limits  CBG MONITORING, ED - Abnormal; Notable for the following components:   Glucose-Capillary 114 (*)    All other components within normal limits  PROTIME-INR  APTT  CBC  DIFFERENTIAL  ETHANOL  COMPREHENSIVE METABOLIC PANEL  RAPID URINE DRUG SCREEN, HOSP PERFORMED  URINALYSIS, ROUTINE W REFLEX MICROSCOPIC    EKG None  Radiology CT HEAD CODE STROKE WO CONTRAST  Result Date: 11/21/2019 CLINICAL DATA:  Code stroke. EXAM: CT HEAD WITHOUT CONTRAST TECHNIQUE: Contiguous axial images were obtained from the base of the skull through the vertex without intravenous contrast. COMPARISON:  09/09/2019 FINDINGS: Brain: No acute intracranial hemorrhage, mass effect, or edema. No new loss of gray-white differentiation. Multiple chronic small vessel infarcts are again identified involving the central white matter, basal ganglia, thalamus, and cerebellum. Ventricles are stable in size. Additional patchy and confluent areas of hypoattenuation in the supratentorial white matter are nonspecific but probably reflects stable chronic microvascular ischemic changes. Vascular: No hyperdense vessel. There is intracranial atherosclerotic calcification at the skull base. Skull: Unremarkable. Sinuses/Orbits: Head mild paranasal sinus mucosal thickening. No significant orbital abnormality. Other: Mastoid air cells are clear. ASPECTS [facility] Stroke Program Early CT Score) - Ganglionic level infarction (caudate, lentiform nuclei, internal capsule, insula, M1-M3 cortex): 7 - Supraganglionic infarction (M4-M6 cortex): 3 Total score (0-10 with 10 being normal): 10 IMPRESSION: No acute  intracranial hemorrhage or evidence of acute infarction. ASPECT score is 10. Stable chronic findings detailed above. These results were communicated to Dr. Otelia Limes at 2:57 pmon  10/4/2021by text page via the [facility] messaging system. Electronically Signed   By: Guadlupe Spanish M.D.   On: 11/21/2019 14:59    Procedures Procedures (including critical care time)  Medications Ordered in ED Medications - No data to display  ED Course  I have reviewed the triage vital signs and the nursing notes.  Pertinent labs & imaging results that were available during my care of the patient were reviewed by me and considered in my medical decision making (see chart for details).    MDM Rules/Calculators/A&P                           This patient is likely having an acute stroke given his focal neurologic deficits, it raises suspicion for the fall that he had a couple of weeks ago as well as this morning.  At this time the patient is outside the window for thrombolytic therapy, he has been seen by the neurology team on arrival, they recommend admission for MRI as the patient does not have a large vessel occlusion clinically and an 18-gauge IV was unable to be established for CT scan thus CT angiogram was not performed.  The patient will need some permissive hypertension, cardiac monitoring.  Labs reviewed showing creatinine of 1.3, electrolytes are normal except for glucose 124, CBC without anemia or leukocytosis.  The CT scan of his brain as viewed and interpreted by myself shows multiple old infarcts, no signs of acute hemorrhage, I agree with the radiologist's interpretation.  EKG is essentially unchanged Primary is [facility] Medicine Discussed with Dr. Allena Katz who will come to admit.  MRI shows that this patient has acute ischemic infarct and will need to be admitted to the hospital.  Final Clinical Impression(s) / ED Diagnoses Final diagnoses:  Acute ischemic stroke [facility])      Eber Hong, MD 11/22/19 1701

## 2019-11-21 NOTE — ED Notes (Signed)
Pt to MRI at this time.

## 2019-11-21 NOTE — Consult Note (Deleted)
Neurology Consultation  Reason for Consult: Right-sided weakness possible stroke Referring Physician: Dr. Hyacinth MeekerMiller  CC: Right-sided weakness  History is obtained from: Patient  HPI: James [name] is a 74 y.o. male with history of hypertension, hyperlipidemia, diabetes, chronic kidney disease, chronic systolic heart failure, atrial flutter and lacunar infarct in 2020.  This morning at 7 AM patient had been found down.  Fire department was called and blood pressure was noted to be 240/140.  Patient denied that he wanted to go into the hospital.  He was told to take his blood pressure medication.  At 10:00 in the morning EMS was called back out to patient's residence secondary to fall.  At that point in time his blood pressure was found to be 180/110.  It was also noted that he was weak on the right aspect of his body with a right facial droop.  Patient was brought to the emergency department.  Upon evaluation in the emergency department code stroke was called secondary to strokelike symptoms.  Unfortunately patient was out of the window for TPA in addition out of the window for interventional radiology.  Neurology was consulted to see patient,  Patient had been seen earlier in 2020 at that time for left-sided weakness however his symptoms had improved and he was outside the window for TPA.  At that time his LDL was 53 and HbA1c was 12.9.  He did show a small remote lacunar infarct in left basal ganglia.  LKW: 10 AM tpa given?: no, out of window Premorbid modified Rankin scale (mRS): 0  NIHSS 1a Level of Conscious.: 0 1b LOC Questions: 0 1c LOC Commands: 0 2 Best Gaze: 0 3 Visual: 0 4 Facial Palsy: 1 5a Motor Arm - left: 0 5b Motor Arm - Right: 1 6a Motor Leg - Left: 0 6b Motor Leg - Right: 1 7 Limb Ataxia: 1 8 Sensory: 0 9 Best Language: 0 10 Dysarthria: 0 11 Extinct. and Inatten.: 0 TOTAL: 4   Past Medical History:  Diagnosis Date  . Abnormal EKG 03/09/2013  . Abnormal nuclear  cardiac imaging test 11/14/2013  . Acute lacunar infarction (HCC) 02/06/2019  . Aortic atherosclerosis (HCC) 07/07/2017  . Arthritis    "all over" (09/30/2017)  . Ascending aortic aneurysm (HCC)    cMRI (2/15): Normal LV EF 54%, Mild BAE, Trileaflet Aortic Valve, Upper limits of normal ascending aorta 3.8 cm, Normal aortic arch 2.3 cm with bovine origin of left carotid  . Atrial flutter (HCC)    Post-op with no reoccurence  . Chronic diastolic heart failure (HCC)    Echo (1/15): Left ventricle: The cavity size was mildly dilated. Mild LVH. EF 55% to 60%. Wall motion was normal; Gr 1 diastolic dysfunction Left atrium: The atrium was mildly dilated. Right atrium: The atrium was mildly to moderately dilated. Aortic arch measures 4.8 cm (moderately dilated); suggest CTA or MRA to better assess.  . Chronic systolic CHF (congestive heart failure) (HCC) 07/24/2017  . CKD (chronic kidney disease), stage III 07/24/2017  . Coronary artery disease 10/2011   a. s/p cabg;  b. Myoview (9/15):  ant ischemia, poss TID, EF 43%, high risk >> LHC (9/15): Dist LM 50, pLAD 90 then 100, pCFX 50, mCFX 75, oRCA 75, dRCA 95, L-LAD patent, S-Dx 100, S-OM1 patent, S-PDA patent, EF 55% >> native Dx small caliber and not well suited for PCI; native RCA amenable to PCI and would restore flow to PLA branches but would jeopardize SVG-RCA - Med Rx  .  Deviated nasal septum   . Dilated aortic root (HCC)   . Diverticulitis of sigmoid colon 07/24/2017  . DM (diabetes mellitus), type 2, uncontrolled with complications (HCC)    onset 2013  . Dyslipidemia   . Edema 02/09/2013  . GERD (gastroesophageal reflux disease)   . Hx of Doppler ultrasound    Carotid US (9/13): No ICA stenosis  . Hypertension   . Pleural thickening 07/24/2017    Family History  Problem Relation Age of Onset  . Hypertension Mother   . Stroke Father 29  . Hypertension Father   . Hypertension Paternal Grandfather   . Heart attack Neg Hx    Social  History:   reports that he has never smoked. He has never used smokeless tobacco. He reports current alcohol use. He reports that he does not use drugs.  Medications No current facility-administered medications for this encounter.  Current Outpatient Medications:  .  acetaminophen (TYLENOL) 500 MG tablet, Take 1,000 mg by mouth every 6 (six) hours as needed for headache., Disp: , Rfl:  .  aspirin 81 MG chewable tablet, Chew 81 mg by mouth daily., Disp: , Rfl:  .  atorvastatin (LIPITOR) 80 MG tablet, Take 1 tablet (80 mg total) by mouth daily., Disp: 30 tablet, Rfl: 0 .  cholecalciferol (VITAMIN D) 1000 units tablet, Take 1,000 Units by mouth daily., Disp: , Rfl:  .  clopidogrel (PLAVIX) 75 MG tablet, Take 75 mg by mouth daily., Disp: , Rfl:  .  ezetimibe (ZETIA) 10 MG tablet, TAKE 1 TABLET (10 MG TOTAL) BY MOUTH DAILY. PLEASE MAKE OVERDUE APPT WITH DR. Mayford Knife BEFORE ANYMORE REFILLS. (Patient taking differently: Take 10 mg by mouth daily. ), Disp: 30 tablet, Rfl: 0 .  fluticasone (FLONASE) 50 MCG/ACT nasal spray, Place 1 spray into both nostrils daily., Disp: , Rfl:  .  insulin aspart (NOVOLOG) 100 UNIT/ML injection, Inject 8 Units into the skin See admin instructions. Three times a day with meals and as needed using sliding scale., Disp: , Rfl:  .  iron polysaccharides (NIFEREX) 150 MG capsule, Take 1 capsule (150 mg total) by mouth daily., Disp: 30 capsule, Rfl: 0 .  isosorbide mononitrate (IMDUR) 60 MG 24 hr tablet, TAKE 1 TABLET (60 MG TOTAL) BY MOUTH DAILY. (Patient taking differently: Take 60 mg by mouth daily. ), Disp: 90 tablet, Rfl: 3 .  metFORMIN (GLUCOPHAGE) 1000 MG tablet, Take 1,000 mg by mouth daily with breakfast., Disp: , Rfl:  .  metoprolol succinate (TOPROL-XL) 50 MG 24 hr tablet, Take 1 tablet (50 mg total) by mouth daily. Take with or immediately following a meal., Disp: 30 tablet, Rfl: 0 .  Multiple Vitamin (MULTIVITAMIN WITH MINERALS) TABS tablet, Take 1 tablet by mouth  daily., Disp: , Rfl:  .  nitroGLYCERIN (NITROSTAT) 0.4 MG SL tablet, Place 1 tablet (0.4 mg total) under the tongue every 5 (five) minutes as needed for chest pain., Disp: 25 tablet, Rfl: 3 .  Omega-3 1000 MG CAPS, Take 2,000 mg by mouth daily., Disp: , Rfl:  .  polyethylene glycol (MIRALAX) packet, Take 17 g by mouth daily., Disp: 14 each, Rfl: 0 .  potassium chloride (KLOR-CON) 10 MEQ tablet, TAKE 1 TABLET (10 MEQ TOTAL) BY MOUTH SEE ADMIN INSTRUCTIONS. PLEASE MAKE APT FOR FUTURE REFILLS 1ST ATTEMPT [phone]. (Patient taking differently: Take 10 mEq by mouth daily. ), Disp: 30 tablet, Rfl: 0 .  pramipexole (MIRAPEX) 0.75 MG tablet, Take 1.5 mg by mouth every evening. , Disp: ,  Rfl:  .  Semaglutide,0.25 or 0.5MG /DOS, (OZEMPIC, 0.25 OR 0.5 MG/DOSE,) 2 MG/1.5ML SOPN, Inject 1 mg into the skin every Sunday. , Disp: , Rfl:  .  torsemide (DEMADEX) 20 MG tablet, Take 1 tablet (20 mg total) by mouth daily., Disp: 30 tablet, Rfl: 3 .  TOUJEO SOLOSTAR 300 UNIT/ML SOPN, Inject 22 Units into the skin at bedtime. , Disp: , Rfl: 3  ROS:   General ROS: negative for - chills, fatigue, fever, night sweats, weight gain or weight loss Psychological ROS: negative for - behavioral disorder, hallucinations, memory difficulties, mood swings or suicidal ideation Ophthalmic ROS: negative for - blurry vision, double vision, eye pain or loss of vision ENT ROS: negative for - epistaxis, nasal discharge, oral lesions, sore throat, tinnitus or vertigo Allergy and Immunology ROS: negative for - hives or itchy/watery eyes Hematological and Lymphatic ROS: negative for - bleeding problems, bruising or swollen lymph nodes Endocrine ROS: negative for - galactorrhea, hair pattern changes, polydipsia/polyuria or temperature intolerance Respiratory ROS: negative for - cough, hemoptysis, shortness of breath or wheezing Cardiovascular ROS: negative for - chest pain, dyspnea on exertion, edema or irregular  heartbeat Gastrointestinal ROS: negative for - abdominal pain, diarrhea, hematemesis, nausea/vomiting or stool incontinence Genito-Urinary ROS: negative for - dysuria, hematuria, incontinence or urinary frequency/urgency Musculoskeletal ROS: Positive for -  muscular weakness Neurological ROS: as noted in HPI Dermatological ROS: negative for rash and skin lesion changes  Exam: Current vital signs: There were no vitals taken for this visit. Vital signs in last 24 hours:     Constitutional: Appears well-developed and well-nourished.  Psych: Affect appropriate to situation Eyes: No scleral injection HENT: No OP obstrucion Head: Normocephalic.  Cardiovascular: Palpable Respiratory: Effort normal, non-labored breathing GI: Soft.  No distension. There is no tenderness.  Skin: WDI, hemosiderin staining and lower extremities with significant edema  Neuro: Mental Status: Patient is awake, alert, oriented to person, place, month, year, and situation.  Patient shows no dysarthria or aphasia.  Patient is able to name, repeat and has comprehension.  Patient is able to follow commands without difficulty.  Cranial Nerves: II: Visual Fields are full.  III,IV, VI: EOMI without ptosis or diploplia. Pupils equal, round and reactive to light V: Facial sensation is symmetric to temperature VII: Right facial droop VIII: hearing is intact to voice X: Palat elevates symmetrically XI: Shoulder shrug is symmetric. XII: tongue is midline without atrophy or fasciculations.  Motor: Right arm and leg shows 4/5 strength left arm and leg shows 5/5 strength.  Patient has a right arm and leg drift Sensory: Sensation is symmetric to light touch and temperature in the arms and legs. Deep Tendon Reflexes: Depressed throughout Plantars: Toes are downgoing bilaterally.  Cerebellar: Ataxia with finger-nose-finger  Labs I have reviewed labs in epic and the results pertinent to this consultation  are:   CBC    Component Value Date/Time   WBC 9.2 11/21/2019 1440   RBC 4.75 11/21/2019 1440   HGB 13.9 11/21/2019 1450   HGB 11.2 (L) 09/24/2017 1033   HCT 41.0 11/21/2019 1450   HCT 34.2 (L) 09/24/2017 1033   PLT 277 11/21/2019 1440   PLT 362 09/24/2017 1033   MCV 85.9 11/21/2019 1440   MCV 84 09/24/2017 1033   MCH 28.8 11/21/2019 1440   MCHC 33.6 11/21/2019 1440   RDW 13.2 11/21/2019 1440   RDW 15.9 (H) 09/24/2017 1033   LYMPHSABS 1.1 11/21/2019 1440   MONOABS 1.0 11/21/2019 1440   EOSABS 0.1 11/21/2019  1440   BASOSABS 0.1 11/21/2019 1440    CMP     Component Value Date/Time   NA 141 11/21/2019 1450   NA 136 01/05/2018 1138   K 3.7 11/21/2019 1450   CL 101 11/21/2019 1450   CO2 25 09/13/2019 0446   GLUCOSE 124 (H) 11/21/2019 1450   BUN 15 11/21/2019 1450   BUN 28 (H) 01/05/2018 1138   CREATININE 1.30 (H) 11/21/2019 1450   CREATININE 1.20 (H) 11/07/2015 0824   CALCIUM 9.0 09/13/2019 0446   PROT 6.9 09/09/2019 0728   PROT 7.3 10/13/2017 0902   ALBUMIN 3.7 09/09/2019 0728   ALBUMIN 4.0 10/13/2017 0902   AST 18 09/09/2019 0728   ALT 16 09/09/2019 0728   ALKPHOS 67 09/09/2019 0728   BILITOT 1.1 09/09/2019 0728   BILITOT 0.5 10/13/2017 0902   GFRNONAA 37 (L) 09/13/2019 0446   GFRAA 42 (L) 09/13/2019 0446    Lipid Panel     Component Value Date/Time   CHOL 209 (H) 02/06/2019 0922   CHOL 116 10/13/2017 0902   TRIG 137 02/06/2019 0922   HDL 42 02/06/2019 0922   HDL 48 10/13/2017 0902   CHOLHDL 5.0 02/06/2019 0922   VLDL 27 02/06/2019 0922   LDLCALC 140 (H) 02/06/2019 0922   LDLCALC 53 10/13/2017 0902     Imaging I have reviewed the images obtained:  CT-scan of the brain-no acute intracranial hemorrhage or evidence of acute infarction   Felicie Morn PA-C [facility] [phone]  M-F  (9:00 am- 5:00 PM)  11/21/2019, 3:14 PM     Assessment:  This is a 74 year old male who initially was seen by fire department secondary to being  found down with elevated blood pressures.  EMS was called back 3 hours later after patient was falling again with blood pressure of 180/110 and at this point time he was found to have right facial droop and right sided weakness, to which she still has.  Code stroke was initiated.  On exam patient still reveals right facial droop and right-sided weakness.  Unfortunately patient is outside of the window for TPA and also intervention.  Unable to obtain CTA secondary to obtain an 18-gauge IV.  At this point suspect left brain infarct and will continue to evaluate with MRI brain MRA of head and neck.  Impression: -Left lacunar infarct  Recommend -MRI of the brain without contrast -MRA Head and neck  -Transthoracic Echo -Continue aspirin and Plavix  -ontinue Atorvastatin 80 mg/other high intensity statin -BP goal: permissive HTN upto 220/120 mmHg -HBAIC and Lipid profile -Telemetry monitoring -Frequent neuro checks -NPO until passes stroke swallow screen -PT/OT # please page stroke NP  Or  PA  Or MD from 8am -4 pm  as this patient from this time will be  followed by the stroke.   You can look them up on [email or web address]  Password [email or web address]

## 2019-11-21 NOTE — Consult Note (Signed)
Neurology Consultation  Reason for Consult: Right-sided weakness/possible stroke  Referring Physician: Dr. Hyacinth Meeker  CC: Right-sided weakness  History is obtained from: Patient  HPI: James [name] is a 74 y.o. male with a history of hypertension, hyperlipidemia, diabetes, chronic kidney disease, chronic systolic heart failure, atrial flutter and lacunar infarct in 2020.  This morning at 7 AM patient had been found down.  Fire department was called and blood pressure was noted to be 240/140.  Patient stated that he did not want to go into the hospital.  He was told to take his blood pressure medication.    At 10:00 in the morning EMS was called back out to patient's residence secondary to sudden onset of right sided weakness. He stayed in his chair and did not try to walk after this. He called EMS and they noted right facial droop, right arm weakness, and right leg weakness. On EMS arrival his blood pressure was found to be 180/110.  It was also noted that he was weak on the right side of his body with a right facial droop.  Patient was brought to the emergency department. Upon evaluation in the emergency department code stroke was called secondary to strokelike symptoms.  Unfortunately patient was out of the window for TPA.  Neurology was consulted to see patient,  Patient had been seen earlier in 2020 at that time for left-sided weakness however his symptoms had improved and he was outside the window for TPA.  At that time his LDL was 53 and HbA1c was 12.9.  He did show a small remote lacunar infarct in left basal ganglia.  He was seen by his Neurologist 5 days ago as follow up for his stroke last December. He was reported as not taking his Brilinta as prescribed.   LKW: 10 AM tpa given?: no, out of window Premorbid modified Rankin scale (mRS): 0  NIHSS 1a Level of Conscious.: 0 1b LOC Questions: 0 1c LOC Commands: 0 2 Best Gaze: 0 3 Visual: 0 4 Facial Palsy: 1 5a Motor Arm - left: 0 5b  Motor Arm - Right: 1 6a Motor Leg - Left: 0 6b Motor Leg - Right: 1 7 Limb Ataxia: 1 8 Sensory: 0 9 Best Language: 0 10 Dysarthria: 0 11 Extinct. and Inatten.: 0 TOTAL: 4   Past Medical History:  Diagnosis Date  . Abnormal EKG 03/09/2013  . Abnormal nuclear cardiac imaging test 11/14/2013  . Acute lacunar infarction (HCC) 02/06/2019  . Aortic atherosclerosis (HCC) 07/07/2017  . Arthritis    "all over" (09/30/2017)  . Ascending aortic aneurysm (HCC)    cMRI (2/15): Normal LV EF 54%, Mild BAE, Trileaflet Aortic Valve, Upper limits of normal ascending aorta 3.8 cm, Normal aortic arch 2.3 cm with bovine origin of left carotid  . Atrial flutter (HCC)    Post-op with no reoccurence  . Chronic diastolic heart failure (HCC)    Echo (1/15): Left ventricle: The cavity size was mildly dilated. Mild LVH. EF 55% to 60%. Wall motion was normal; Gr 1 diastolic dysfunction Left atrium: The atrium was mildly dilated. Right atrium: The atrium was mildly to moderately dilated. Aortic arch measures 4.8 cm (moderately dilated); suggest CTA or MRA to better assess.  . Chronic systolic CHF (congestive heart failure) (HCC) 07/24/2017  . CKD (chronic kidney disease), stage III 07/24/2017  . Coronary artery disease 10/2011   a. s/p cabg;  b. Myoview (9/15):  ant ischemia, poss TID, EF 43%, high risk >> LHC (9/15): Dist  LM 50, pLAD 90 then 100, pCFX 50, mCFX 75, oRCA 75, dRCA 95, L-LAD patent, S-Dx 100, S-OM1 patent, S-PDA patent, EF 55% >> native Dx small caliber and not well suited for PCI; native RCA amenable to PCI and would restore flow to PLA branches but would jeopardize SVG-RCA - Med Rx  . Deviated nasal septum   . Dilated aortic root (HCC)   . Diverticulitis of sigmoid colon 07/24/2017  . DM (diabetes mellitus), type 2, uncontrolled with complications (HCC)    onset 2013  . Dyslipidemia   . Edema 02/09/2013  . GERD (gastroesophageal reflux disease)   . Hx of Doppler ultrasound    Carotid US (9/13):  No ICA stenosis  . Hypertension   . Pleural thickening 07/24/2017    Family History  Problem Relation Age of Onset  . Hypertension Mother   . Stroke Father 63  . Hypertension Father   . Hypertension Paternal Grandfather   . Heart attack Neg Hx    Social History:   reports that he has never smoked. He has never used smokeless tobacco. He reports current alcohol use. He reports that he does not use drugs.  Medications No current facility-administered medications for this encounter.  Current Outpatient Medications:  .  acetaminophen (TYLENOL) 500 MG tablet, Take 1,000 mg by mouth every 6 (six) hours as needed for headache., Disp: , Rfl:  .  aspirin 81 MG chewable tablet, Chew 81 mg by mouth daily., Disp: , Rfl:  .  atorvastatin (LIPITOR) 80 MG tablet, Take 1 tablet (80 mg total) by mouth daily., Disp: 30 tablet, Rfl: 0 .  cholecalciferol (VITAMIN D) 1000 units tablet, Take 1,000 Units by mouth daily., Disp: , Rfl:  .  clopidogrel (PLAVIX) 75 MG tablet, Take 75 mg by mouth daily., Disp: , Rfl:  .  ezetimibe (ZETIA) 10 MG tablet, TAKE 1 TABLET (10 MG TOTAL) BY MOUTH DAILY. PLEASE MAKE OVERDUE APPT WITH DR. Mayford Knife BEFORE ANYMORE REFILLS. (Patient taking differently: Take 10 mg by mouth daily. ), Disp: 30 tablet, Rfl: 0 .  fluticasone (FLONASE) 50 MCG/ACT nasal spray, Place 1 spray into both nostrils daily., Disp: , Rfl:  .  insulin aspart (NOVOLOG) 100 UNIT/ML injection, Inject 8 Units into the skin See admin instructions. Three times a day with meals and as needed using sliding scale., Disp: , Rfl:  .  iron polysaccharides (NIFEREX) 150 MG capsule, Take 1 capsule (150 mg total) by mouth daily., Disp: 30 capsule, Rfl: 0 .  isosorbide mononitrate (IMDUR) 60 MG 24 hr tablet, TAKE 1 TABLET (60 MG TOTAL) BY MOUTH DAILY. (Patient taking differently: Take 60 mg by mouth daily. ), Disp: 90 tablet, Rfl: 3 .  metFORMIN (GLUCOPHAGE) 1000 MG tablet, Take 1,000 mg by mouth daily with breakfast., Disp: ,  Rfl:  .  metoprolol succinate (TOPROL-XL) 50 MG 24 hr tablet, Take 1 tablet (50 mg total) by mouth daily. Take with or immediately following a meal., Disp: 30 tablet, Rfl: 0 .  Multiple Vitamin (MULTIVITAMIN WITH MINERALS) TABS tablet, Take 1 tablet by mouth daily., Disp: , Rfl:  .  nitroGLYCERIN (NITROSTAT) 0.4 MG SL tablet, Place 1 tablet (0.4 mg total) under the tongue every 5 (five) minutes as needed for chest pain., Disp: 25 tablet, Rfl: 3 .  Omega-3 1000 MG CAPS, Take 2,000 mg by mouth daily., Disp: , Rfl:  .  polyethylene glycol (MIRALAX) packet, Take 17 g by mouth daily., Disp: 14 each, Rfl: 0 .  potassium chloride (KLOR-CON) 10  MEQ tablet, TAKE 1 TABLET (10 MEQ TOTAL) BY MOUTH SEE ADMIN INSTRUCTIONS. PLEASE MAKE APT FOR FUTURE REFILLS 1ST ATTEMPT [phone]. (Patient taking differently: Take 10 mEq by mouth daily. ), Disp: 30 tablet, Rfl: 0 .  pramipexole (MIRAPEX) 0.75 MG tablet, Take 1.5 mg by mouth every evening. , Disp: , Rfl:  .  Semaglutide,0.25 or 0.5MG /DOS, (OZEMPIC, 0.25 OR 0.5 MG/DOSE,) 2 MG/1.5ML SOPN, Inject 1 mg into the skin every Sunday. , Disp: , Rfl:  .  torsemide (DEMADEX) 20 MG tablet, Take 1 tablet (20 mg total) by mouth daily., Disp: 30 tablet, Rfl: 3 .  TOUJEO SOLOSTAR 300 UNIT/ML SOPN, Inject 22 Units into the skin at bedtime. , Disp: , Rfl: 3  ROS:  General ROS: negative for - chills, fatigue, fever, night sweats, weight gain or weight loss Psychological ROS: negative for - behavioral disorder, hallucinations, memory difficulties, mood swings or suicidal ideation Ophthalmic ROS: negative for - blurry vision, double vision, eye pain or loss of vision ENT ROS: negative for - epistaxis, nasal discharge, oral lesions, sore throat, tinnitus or vertigo Allergy and Immunology ROS: negative for - hives or itchy/watery eyes Hematological and Lymphatic ROS: negative for - bleeding problems, bruising or swollen lymph nodes Endocrine ROS: negative for - galactorrhea, hair  pattern changes, polydipsia/polyuria or temperature intolerance Respiratory ROS: negative for - cough, hemoptysis, shortness of breath or wheezing Cardiovascular ROS: negative for - chest pain, dyspnea on exertion, edema or irregular heartbeat Gastrointestinal ROS: negative for - abdominal pain, diarrhea, hematemesis, nausea/vomiting or stool incontinence Genito-Urinary ROS: negative for - dysuria, hematuria, incontinence or urinary frequency/urgency Musculoskeletal ROS: Positive for -  muscular weakness Neurological ROS: as noted in HPI Dermatological ROS: negative for rash and skin lesion changes  Exam: Current vital signs: There were no vitals taken for this visit. Vital signs in last 24 hours:    Constitutional: Appears well-developed and well-nourished.  Psych: Affect appropriate to situation Eyes: No scleral injection HENT: No OP obstrucion Head: Normocephalic.  Cardiovascular: Palpable Respiratory: Effort normal, non-labored breathing GI: Soft.  No distension. There is no tenderness.  Skin: WDI, hemosiderin staining and lower extremities with significant edema  Neuro: Mental Status: Patient is awake, alert, oriented to person, place, month, year, and situation.  Patient shows no dysarthria or aphasia.  Patient is able to name, repeat and has comprehension.  Patient is able to follow commands without difficulty. Cranial Nerves: II: Visual Fields are full.  III,IV, VI: EOMI without ptosis or diploplia. Pupils equal, round and reactive to light V: Facial sensation is symmetric to temperature VII: Right facial droop VIII: hearing is intact to voice X: Palate elevates symmetrically XI: Shoulder shrug is symmetric. XII: tongue is midline without atrophy or fasciculations.  Motor: Right arm and leg shows 4/5 strength. Left arm and leg shows 5/5 strength.  Patient has right arm and leg drift Sensory: Sensation is symmetric to light touch and temperature in the arms and  legs. Deep Tendon Reflexes: Depressed throughout Plantars: Toes are downgoing bilaterally.  Cerebellar: Ataxia with finger-nose-finger on the right.   Labs I have reviewed labs in epic and the results pertinent to this consultation are:   CBC    Component Value Date/Time   WBC 9.2 11/21/2019 1440   RBC 4.75 11/21/2019 1440   HGB 13.9 11/21/2019 1450   HGB 11.2 (L) 09/24/2017 1033   HCT 41.0 11/21/2019 1450   HCT 34.2 (L) 09/24/2017 1033   PLT 277 11/21/2019 1440   PLT 362  09/24/2017 1033   MCV 85.9 11/21/2019 1440   MCV 84 09/24/2017 1033   MCH 28.8 11/21/2019 1440   MCHC 33.6 11/21/2019 1440   RDW 13.2 11/21/2019 1440   RDW 15.9 (H) 09/24/2017 1033   LYMPHSABS 1.1 11/21/2019 1440   MONOABS 1.0 11/21/2019 1440   EOSABS 0.1 11/21/2019 1440   BASOSABS 0.1 11/21/2019 1440    CMP     Component Value Date/Time   NA 141 11/21/2019 1450   NA 136 01/05/2018 1138   K 3.7 11/21/2019 1450   CL 101 11/21/2019 1450   CO2 25 09/13/2019 0446   GLUCOSE 124 (H) 11/21/2019 1450   BUN 15 11/21/2019 1450   BUN 28 (H) 01/05/2018 1138   CREATININE 1.30 (H) 11/21/2019 1450   CREATININE 1.20 (H) 11/07/2015 0824   CALCIUM 9.0 09/13/2019 0446   PROT 6.9 09/09/2019 0728   PROT 7.3 10/13/2017 0902   ALBUMIN 3.7 09/09/2019 0728   ALBUMIN 4.0 10/13/2017 0902   AST 18 09/09/2019 0728   ALT 16 09/09/2019 0728   ALKPHOS 67 09/09/2019 0728   BILITOT 1.1 09/09/2019 0728   BILITOT 0.5 10/13/2017 0902   GFRNONAA 37 (L) 09/13/2019 0446   GFRAA 42 (L) 09/13/2019 0446    Lipid Panel     Component Value Date/Time   CHOL 209 (H) 02/06/2019 0922   CHOL 116 10/13/2017 0902   TRIG 137 02/06/2019 0922   HDL 42 02/06/2019 0922   HDL 48 10/13/2017 0902   CHOLHDL 5.0 02/06/2019 0922   VLDL 27 02/06/2019 0922   LDLCALC 140 (H) 02/06/2019 0922   LDLCALC 53 10/13/2017 0902     Imaging I have reviewed the images obtained:  CT-scan of the brain-no acute intracranial hemorrhage or evidence  of acute infarction   Felicie Morn PA-C [facility] [phone] 11/21/2019, 3:14 PM    Assessment: 74 year old male who initially was seen by fire department secondary to being found down with elevated blood pressures.  EMS was called back 3 hours later after patient had onset of acute right sided weakness. On EMS arrival with their second visit, the patient's blood pressure was 180/110 and at this point in time he was found to have right facial droop and right sided weakness.  Code stroke was initiated.   1. On exam patient still has right facial droop and right-sided weakness.  NIHSS is 4.  2. Unfortunately patient is outside of the time window for tPA. He is not a candidate for intervention as overall exam findings are not consistent with LVO.  3. Unable to obtain CTA secondary difficulty placing an 18-gauge IV. Will need to perform vascular imaging with MRA.  4. At this point suspect left sided lacunar infarction and will continue to evaluate with MRI brain as well as MRA of head and neck.  Impression: -Probable acute left sided lacunar infarct  Recommendations: -MRI of the brain without contrast -MRA Head and neck  -Transthoracic Echo -Continue aspirin and Plavix  -Continue Atorvastatin 80 mg/other high intensity statin -BP goal: permissive HTN upto 220/120 mmHg -HBAIC and Lipid profile -Telemetry monitoring -Frequent neuro checks -NPO until passes stroke swallow screen -PT/OT # please page stroke NP  Or  PA  Or MD from 8am -4 pm  as this patient from this time will be  followed by the stroke.   You can look them up on [email or web address]  Password [email or web address]  I have seen and examined the patient. I have formulated the assessment and recommendations. 74 year  old male presenting with findings that are most consistent with an acute left hemispheric lacunar infarction. Out of the tPA time window. Not an interventional candidate. Recommendations as above.  Electronically signed: Dr.  Caryl Pina

## 2019-11-22 ENCOUNTER — Other Ambulatory Visit: Payer: Self-pay

## 2019-11-22 ENCOUNTER — Inpatient Hospital Stay (HOSPITAL_COMMUNITY): Payer: Medicare HMO

## 2019-11-22 DIAGNOSIS — Z8673 Personal history of transient ischemic attack (TIA), and cerebral infarction without residual deficits: Secondary | ICD-10-CM

## 2019-11-22 DIAGNOSIS — I6389 Other cerebral infarction: Secondary | ICD-10-CM

## 2019-11-22 LAB — HEMOGLOBIN A1C
Hgb A1c MFr Bld: 6.8 % — ABNORMAL HIGH (ref 4.8–5.6)
Mean Plasma Glucose: 148.46 mg/dL

## 2019-11-22 LAB — URINALYSIS, ROUTINE W REFLEX MICROSCOPIC
Bacteria, UA: NONE SEEN
Bilirubin Urine: NEGATIVE
Glucose, UA: NEGATIVE mg/dL
Ketones, ur: NEGATIVE mg/dL
Leukocytes,Ua: NEGATIVE
Nitrite: NEGATIVE
Protein, ur: 100 mg/dL — AB
Specific Gravity, Urine: 1.01 (ref 1.005–1.030)
pH: 7 (ref 5.0–8.0)

## 2019-11-22 LAB — GLUCOSE, CAPILLARY
Glucose-Capillary: 156 mg/dL — ABNORMAL HIGH (ref 70–99)
Glucose-Capillary: 79 mg/dL (ref 70–99)

## 2019-11-22 LAB — LIPID PANEL
Cholesterol: 229 mg/dL — ABNORMAL HIGH (ref 0–200)
HDL: 42 mg/dL (ref >40)
LDL Cholesterol: 160 mg/dL — ABNORMAL HIGH (ref 0–99)
Total CHOL/HDL Ratio: 5.5 RATIO
Triglycerides: 134 mg/dL (ref <150)
VLDL: 27 mg/dL (ref 0–40)

## 2019-11-22 LAB — CBG MONITORING, ED
Glucose-Capillary: 116 mg/dL — ABNORMAL HIGH (ref 70–99)
Glucose-Capillary: 159 mg/dL — ABNORMAL HIGH (ref 70–99)

## 2019-11-22 LAB — ECHOCARDIOGRAM COMPLETE
Area-P 1/2: 3.46 cm2
Height: 69 in
S' Lateral: 4.68 cm
Weight: 3840 oz

## 2019-11-22 LAB — RAPID URINE DRUG SCREEN, HOSP PERFORMED
Amphetamines: NOT DETECTED
Barbiturates: NOT DETECTED
Benzodiazepines: NOT DETECTED
Cocaine: NOT DETECTED
Opiates: NOT DETECTED
Tetrahydrocannabinol: NOT DETECTED

## 2019-11-22 LAB — TROPONIN I (HIGH SENSITIVITY): Troponin I (High Sensitivity): 34 ng/L — ABNORMAL HIGH (ref <18)

## 2019-11-22 MED ORDER — TICAGRELOR 90 MG PO TABS
90.0000 mg | ORAL_TABLET | Freq: Two times a day (BID) | ORAL | Status: DC
  Administered 2019-11-22 – 2019-11-23 (×2): 90 mg via ORAL
  Filled 2019-11-22 (×2): qty 1

## 2019-11-22 MED ORDER — PRAMIPEXOLE DIHYDROCHLORIDE 1.5 MG PO TABS
1.5000 mg | ORAL_TABLET | Freq: Every day | ORAL | Status: DC
  Administered 2019-11-22: 1.5 mg via ORAL
  Filled 2019-11-22 (×3): qty 1

## 2019-11-22 NOTE — ED Notes (Signed)
Got patient on the monitor patient is resting with call bell in reach  ?

## 2019-11-22 NOTE — Evaluation (Addendum)
Physical Therapy Evaluation Patient Details Name: James [name] MRN: [id number] DOB: 1945/04/04 Today's Date: 11/22/2019   History of Present Illness  74 y.o. male with medical history significant for CAD s/p CABG, history of CVA with cryptogenic stroke in December 2020, chronic systolic CHF, CKD stage III, type 2 diabetes, hypertension, hyperlipidemia, and recurrent falls who presents to the ED on 10/4 for evaluation of right-sided weakness. MRI reveals small remote lacunar infarct L BG, L lateral thalamus and posterior limb IC CVA.  Clinical Impression   Pt presents with R sided weakness, RLE incoordination during mobility, difficulty performing mobility tasks poor standing balance with posterior bias, and decreased activity tolerance. Pt to benefit from acute PT to address deficits. Pt required mod assist for bed mobility and transfer to stand, pt taking a few steps to recliner limited by posterior LOB and R knee buckling. PT recommending SNF level of care post-acutely, pt adamantly refusing even though he lives alone, cannot have 24/7 support, and has a recent history of frequent falls. Pt says he prefers to d/c home with HHPT, and "can someone come stay with me during the day?". PT explained even if he qualified for an aide, would not be all day or every day care. PT to progress mobility as tolerated, and will continue to follow acutely.      Follow Up Recommendations SNF;Supervision/Assistance - 24 hour (pt declines - HHPT and maximize HH services (aide))    Equipment Recommendations  None recommended by PT    Recommendations for Other Services       Precautions / Restrictions Precautions Precautions: Fall Restrictions Weight Bearing Restrictions: No      Mobility  Bed Mobility Overal bed mobility: Needs Assistance Bed Mobility: Supine to Sit     Supine to sit: Mod assist;HOB elevated     General bed mobility comments: Mod assist for trunk elevation off of bed, LE  management, and scooting to EOB.  Transfers Overall transfer level: Needs assistance Equipment used: Rolling walker (2 wheeled) Transfers: Sit to/from Stand Sit to Stand: Mod assist;From elevated surface         General transfer comment: Mod assist for power up, steadying, and correcting heavy posterior bias.  Ambulation/Gait Ambulation/Gait assistance: Mod assist Gait Distance (Feet): 3 Feet Assistive device: Rolling walker (2 wheeled) Gait Pattern/deviations: Step-through pattern;Decreased stride length;Trunk flexed;Decreased dorsiflexion - right Gait velocity: decr   General Gait Details: Mod assist for steadying, guiding RW, correcting posterior LOB, and safely guiding pt to recliner. Difficulty lifting RLE anti-gravity, requiring PT facilitation for swing phase and x1 buckling R knee.  Stairs            Wheelchair Mobility    Modified Rankin (Stroke Patients Only) Modified Rankin (Stroke Patients Only) Pre-Morbid Rankin Score: Moderate disability Modified Rankin: Moderately severe disability     Balance Overall balance assessment: Needs assistance;History of Falls Sitting-balance support: No upper extremity supported;Feet supported Sitting balance-Leahy Scale: Fair   Postural control: Posterior lean Standing balance support: Bilateral upper extremity supported;During functional activity Standing balance-Leahy Scale: Poor Standing balance comment: reliant on external assist                             Pertinent Vitals/Pain Pain Assessment: No/denies pain    Home Living Family/patient expects to be discharged to:: Private residence Living Arrangements: Alone Available Help at Discharge: Family;Available PRN/intermittently Type of Home: House Home Access: Stairs to enter Entrance Stairs-Rails: Right Entrance Stairs-Number  of Steps: 4 Home Layout: Two level;Able to live on main level with bedroom/bathroom Home Equipment: Grab bars -  tub/shower;Shower seat;Cane - single point;Walker - 2 wheels      Prior Function Level of Independence: Independent with assistive device(s)         Comments: pt reports using both cane and walker PTA, reports "a couple" of falls one of which pt laid in the driveway all night. Pt reports getting life alert button since then.     Hand Dominance   Dominant Hand: Right    Extremity/Trunk Assessment   Upper Extremity Assessment Upper Extremity Assessment: Defer to OT evaluation (weakened R grip, impaired sensation)    Lower Extremity Assessment Lower Extremity Assessment: Generalized weakness;RLE deficits/detail RLE Deficits / Details: 3+/5 hip flexion, full AROM knee flexion/extension (NT against resistance given incoordination)    Cervical / Trunk Assessment Cervical / Trunk Assessment: Normal  Communication   Communication: No difficulties  Cognition Arousal/Alertness: Awake/alert Behavior During Therapy: WFL for tasks assessed/performed Overall Cognitive Status: Impaired/Different from baseline Area of Impairment: Safety/judgement                         Safety/Judgement: Decreased awareness of deficits;Decreased awareness of safety     General Comments: Poor insight into deficits, pt insistent on d/c home even though is requiring mod assist for mobility      General Comments      Exercises     Assessment/Plan    PT Assessment Patient needs continued PT services  PT Problem List Decreased strength;Decreased mobility;Decreased safety awareness;Decreased activity tolerance;Decreased balance;Decreased knowledge of use of DME;Impaired sensation;Obesity       PT Treatment Interventions DME instruction;Therapeutic activities;Gait training;Therapeutic exercise;Patient/family education;Functional mobility training;Neuromuscular re-education;Balance training;Stair training    PT Goals (Current goals can be found in the Care Plan section)  Acute Rehab PT  Goals Patient Stated Goal: go home PT Goal Formulation: With patient Time For Goal Achievement: 12/06/19 Potential to Achieve Goals: Fair    Frequency Min 4X/week   Barriers to discharge        Co-evaluation               AM-PAC PT "6 Clicks" Mobility  Outcome Measure Help needed turning from your back to your side while in a flat bed without using bedrails?: A Lot Help needed moving from lying on your back to sitting on the side of a flat bed without using bedrails?: A Lot Help needed moving to and from a bed to a chair (including a wheelchair)?: A Lot Help needed standing up from a chair using your arms (e.g., wheelchair or bedside chair)?: A Lot Help needed to walk in hospital room?: A Lot Help needed climbing 3-5 steps with a railing? : Total 6 Click Score: 11    End of Session Equipment Utilized During Treatment: Gait belt Activity Tolerance: Patient limited by fatigue Patient left: in chair;with call bell/phone within reach;with chair alarm set;Other (comment);with nursing/sitter in room (MD and RN in room) Nurse Communication: Mobility status PT Visit Diagnosis: Other abnormalities of gait and mobility (R26.89);Hemiplegia and hemiparesis Hemiplegia - Right/Left: Right Hemiplegia - dominant/non-dominant: Dominant Hemiplegia - caused by: Cerebral infarction    Time: 9326-7124 PT Time Calculation (min) (ACUTE ONLY): 23 min   Charges:   PT Evaluation $PT Eval Low Complexity: 1 Low PT Treatments $Therapeutic Activity: 8-22 mins       Onie Kasparek E, PT Acute Rehabilitation Services Pager [phone]  Office [phone]  Arva Slaugh D Loni Delbridge 11/22/2019, 4:08 PM

## 2019-11-22 NOTE — Progress Notes (Signed)
PROGRESS NOTE    [name]  VOH:[id number] DOB: Sep 25, 1945 DOA: 11/21/2019 PCP: Koren Shiver, DO (Confirm with patient/family/NH records and if not entered, this HAS to be entered at [facility] point of entry. "No PCP" if truly none.)   Brief Narrative: (Start on day 1 of progress note - keep it brief and live)  Patient is a 74 y.o. male with medical history significant for CAD s/p CABG, history of CVA with cryptogenic stroke in December 2020, chronic systolic CHF, CKD stage III, type 2 diabetes, hypertension, hyperlipidemia, and recurrent falls who presents to the ED for evaluation of right-sided weakness.CT head without contrast was negative for acute intracranial hemorrhage or evidence of acute infarct.Neurology were consulted and recommended further stroke work-up. He was not considered a TPA candidate as he was outside of the window.MRI brain without contrast, MRA head/neck showed an acute subcentimeter infarction affecting the left lateral thalamus/posterior limb internal capsule.Intracranial atherosclerotic disease also seen with focal stenosis at the left MCA bifurcation.   Assessment & Plan:   Principal Problem:   Acute CVA (cerebrovascular accident) (HCC) Active Problems:   Hypertension associated with diabetes (HCC)   Coronary artery disease   DM (diabetes mellitus), type 2, uncontrolled with complications (HCC)   Chronic systolic CHF (congestive heart failure) (HCC)   CKD (chronic kidney disease), stage III (HCC)   Hyperlipidemia associated with type 2 diabetes mellitus (HCC)   Acute stroke present on admission Patient presented with right upper extremity weakness.MRI brain without contrast, MRA head/neck showed an acute subcentimeter infarction affecting the left lateral thalamus/posterior limb internal capsule.Intracranial atherosclerotic disease also seen with focal stenosis at the left MCA bifurcation. Continue aspirin and Plavix. Echocardiogram ordered Neurology  consult placed and will appreciate their recommendations. Lipitor 80 mg daily Lipid panel and globin A1c ordered Neurochecks, fall precautions Continuous telemetry monitoring Physical therapy and Occupational Therapy to evaluate and treat Permissive hypertension   Insulin-dependent diabetes mellitus Sliding scale insulin.  12 units of glargine nightly.  Continue to monitor glucose level and will adjust glargine units accordingly.  Hypoglycemic protocol in place.  Hemoglobin A1c ordered  Coronary artery disease s/p CABG Stable.  Patient denies any chest pain troponins mildly elevated but EKG was negative for acute ischemic changes.  Troponins mildly elevated most likely secondary to demand ischemia in the setting of acute cerebrovascular accident.  Continue atorvastatin.  Hold home Toprol because of permissive hypertension.  Continuous cardiac monitoring.  Hypertension Hold home blood pressure medications to allow for permissive hypertension.  IV labetalol 5 mg every 4 hours as needed if the systolic blood pressure is above 220 or diastolic above 110.  Hyperlipidemia  Continue Lipitor and Zetia  Heart failure with reduced ejection fraction Stable.  Last echocardiogram showed ejection fraction of 45 to 50%.  Hold home torsemide at this time with concern of permissive hypertension.  CKD stage III Stable.  Continue to monitor BMP in the morning    DVT prophylaxis: (Lovenox/Heparin/SCD's/anticoagulated/None (if comfort care) Code Status: (Full/Partial - specify details) Family Communication: (Specify name, relationship & date discussed. NO "discussed with patient") Disposition Plan: (specify when and where you expect patient to be discharged). Include barriers to DC in this tab.   Consultants:   Neurology  Procedures: (Don't include imaging studies which can be auto populated. Include things that cannot be auto populated i.e. Echo, Carotid and venous dopplers, Foley, Bipap, HD,  tubes/drains, wound vac, central lines etc)    Antimicrobials: (specify start and planned stop date. Auto populated  tables are space occupying and do not give end dates)     Subjective:  Patient is seen and evaluated while the patient was in the ED this morning.  Patient is still complaining of mild weakness in his right upper extremities but otherwise denies fever, chills, dizziness, headache, numbness or tingling sensation, chest pain, denies of breath, nausea, vomiting, abdominal pain and urinary symptoms.   Objective: Vitals:   11/22/19 0638 11/22/19 0750 11/22/19 0800 11/22/19 1536  BP: (!) 151/103 (!) 171/83 137/85 139/82  Pulse: 81 80 76 96  Resp: 16 19 16    Temp:    98 F (36.7 C)  TempSrc:    Oral  SpO2: 96% 94% 96% 99%  Weight:      Height:        Intake/Output Summary (Last 24 hours) at 11/22/2019 1936 Last data filed at 11/22/2019 0055 Gross per 24 hour  Intake --  Output 1000 ml  Net -1000 ml   Filed Weights   11/21/19 1543  Weight: 108.9 kg    Examination:  General exam: Appears calm and comfortable  Respiratory system: Clear to auscultation. Respiratory effort normal. Cardiovascular system: S1 & S2 heard, RRR. No JVD, murmurs, rubs, gallops or clicks. No pedal edema. Gastrointestinal system: Abdomen is nondistended, soft and nontender. No organomegaly or masses felt. Normal bowel sounds heard. Central nervous system: Alert and oriented. No focal neurological deficits. Extremities: Symmetric 5 x 5 power. Skin: Multiple healed scars in both upper and lower extremities. Neurology: Patient is awake alert and oriented to time person and place.  Normal speech.  Cranial nerves II to XII grossly intact.  Muscular strength is 4/5 in right upper extremity but 5/5 in rest of extremities.  Sensations intact throughout. Psychiatry: Judgement and insight appear normal. Mood & affect appropriate.     Data Reviewed: I have personally reviewed following labs and  imaging studies  CBC: Recent Labs  Lab 11/21/19 1440 11/21/19 1450  WBC 9.2  --   NEUTROABS 6.9  --   HGB 13.7 13.9  HCT 40.8 41.0  MCV 85.9  --   PLT 277  --    Basic Metabolic Panel: Recent Labs  Lab 11/21/19 1440 11/21/19 1450  NA 139 141  K 3.7 3.7  CL 103 101  CO2 26  --   GLUCOSE 126* 124*  BUN 14 15  CREATININE 1.29* 1.30*  CALCIUM 9.4  --    GFR: Estimated Creatinine Clearance: 60.6 mL/min (A) (by C-G formula based on SCr of 1.3 mg/dL (H)). Liver Function Tests: Recent Labs  Lab 11/21/19 1440  AST 21  ALT 19  ALKPHOS 82  BILITOT 0.7  PROT 7.3  ALBUMIN 3.8   No results for input(s): LIPASE, AMYLASE in the last 168 hours. No results for input(s): AMMONIA in the last 168 hours. Coagulation Profile: Recent Labs  Lab 11/21/19 1440  INR 1.0   Cardiac Enzymes: No results for input(s): CKTOTAL, CKMB, CKMBINDEX, TROPONINI in the last 168 hours. BNP (last 3 results) No results for input(s): PROBNP in the last 8760 hours. HbA1C: Recent Labs    11/22/19 0208  HGBA1C 6.8*   CBG: Recent Labs  Lab 11/21/19 1442 11/22/19 0744 11/22/19 1235 11/22/19 1548  GLUCAP 114* 116* 159* 156*   Lipid Profile: Recent Labs    11/22/19 0208  CHOL 229*  HDL 42  LDLCALC 160*  TRIG 134  CHOLHDL 5.5   Thyroid Function Tests: No results for input(s): TSH, T4TOTAL, FREET4, T3FREE, THYROIDAB in  the last 72 hours. Anemia Panel: No results for input(s): VITAMINB12, FOLATE, FERRITIN, TIBC, IRON, RETICCTPCT in the last 72 hours. Sepsis Labs: No results for input(s): PROCALCITON, LATICACIDVEN in the last 168 hours.  Recent Results (from the past 240 hour(s))  Respiratory Panel by RT PCR (Flu A&B, Covid) - Nasopharyngeal Swab     Status: None   Collection Time: 11/21/19  8:58 PM   Specimen: Nasopharyngeal Swab  Result Value Ref Range Status   SARS Coronavirus 2 by RT PCR NEGATIVE NEGATIVE Final    Comment: (NOTE) SARS-CoV-2 target nucleic acids are NOT  DETECTED.  The SARS-CoV-2 RNA is generally detectable in upper respiratoy specimens during the acute phase of infection. The lowest concentration of SARS-CoV-2 viral copies this assay can detect is 131 copies/mL. A negative result does not preclude SARS-Cov-2 infection and should not be used as the sole basis for treatment or other patient management decisions. A negative result may occur with  improper specimen collection/handling, submission of specimen other than nasopharyngeal swab, presence of viral mutation(s) within the areas targeted by this assay, and inadequate number of viral copies (<131 copies/mL). A negative result must be combined with clinical observations, patient history, and epidemiological information. The expected result is Negative.  Fact Sheet for Patients:  https://www.moore.com/  Fact Sheet for Healthcare Providers:  https://www.young.biz/  This test is no t yet approved or cleared by the [place] FDA and  has been authorized for detection and/or diagnosis of SARS-CoV-2 by FDA under an Emergency Use Authorization (EUA). This EUA will remain  in effect (meaning this test can be used) for the duration of the COVID-19 declaration under Section 564(b)(1) of the Act, 21 U.S.C. section 360bbb-3(b)(1), unless the authorization is terminated or revoked sooner.     Influenza A by PCR NEGATIVE NEGATIVE Final   Influenza B by PCR NEGATIVE NEGATIVE Final    Comment: (NOTE) The Xpert Xpress SARS-CoV-2/FLU/RSV assay is intended as an aid in  the diagnosis of influenza from Nasopharyngeal swab specimens and  should not be used as a sole basis for treatment. Nasal washings and  aspirates are unacceptable for Xpert Xpress SARS-CoV-2/FLU/RSV  testing.  Fact Sheet for Patients: https://www.moore.com/  Fact Sheet for Healthcare Providers: https://www.young.biz/  This test is not yet  approved or cleared by the [place] FDA and  has been authorized for detection and/or diagnosis of SARS-CoV-2 by  FDA under an Emergency Use Authorization (EUA). This EUA will remain  in effect (meaning this test can be used) for the duration of the  Covid-19 declaration under Section 564(b)(1) of the Act, 21  U.S.C. section 360bbb-3(b)(1), unless the authorization is  terminated or revoked. Performed at [facility], [address], [place], [place] [address]          Radiology Studies: MR ANGIO HEAD WO CONTRAST  Result Date: 11/21/2019 CLINICAL DATA:  Follow-up stroke presentation. EXAM: MRI HEAD WITHOUT AND WITH CONTRAST MRA HEAD WITHOUT CONTRAST MRA NECK WITHOUT AND WITH CONTRAST TECHNIQUE: Multiplanar, multiecho pulse sequences of the brain and surrounding structures were obtained without and with intravenous contrast. Angiographic images of the Circle of Willis were obtained using MRA technique without intravenous contrast. Angiographic images of the neck were obtained using MRA technique without and with intravenous contrast. Carotid stenosis measurements (when applicable) are obtained utilizing NASCET criteria, using the distal internal carotid diameter as the denominator. CONTRAST:  10mL GADAVIST GADOBUTROL 1 MMOL/ML IV SOLN COMPARISON:  CT studies done yesterday.  MRI 02/06/2019 FINDINGS:  MRI HEAD FINDINGS Brain: Diffusion imaging shows a subcentimeter region of acute infarction affecting the left lateral thalamus/posterior limb internal capsule. Old small vessel ischemic changes affect the pons. Old small vessel cerebellar infarctions. Old small vessel infarctions affect both thalami, both basal ganglia regions and are present throughout the deep and subcortical white matter of both hemispheres. No large vessel territory infarction. No mass lesion, acute hemorrhage, hydrocephalus or extra-axial collection. Vascular: Major vessels at the base of the brain show flow. Skull and  upper cervical spine: Negative Sinuses/Orbits: Clear/normal Other: None MRA HEAD FINDINGS Both internal carotid arteries widely patent through the skull base and siphon regions. The anterior and middle cerebral vessels are patent. There is stenosis at the left MCA bifurcation, particularly affecting the inferior division. Multiple stenoses in the more peripheral branch vessels. Both vertebral arteries are patent through the foramen magnum. The right terminates in PICA. The left vertebral artery supplies the basilar. No basilar stenosis. Posterior circulation branch vessels show flow. Primary fetal origin of the left PCA. Atherosclerotic narrowing the more distal branch vessels. MRA NECK FINDINGS Branching pattern is normal without origin stenosis. Both common carotid arteries are widely patent to the bifurcation. Both carotid bifurcations are widely patent. No stenosis or irregularity. Both cervical internal carotid arteries show flow. Both vertebral arteries are patent with the left being dominant. IMPRESSION: 1. Acute subcentimeter infarction affecting the left lateral thalamus/posterior limb internal capsule. 2. Extensive chronic small-vessel ischemic changes throughout the brain as outlined above. 3. No significant carotid bifurcation disease. 4. Intracranial atherosclerotic disease. Atherosclerotic narrowing and irregularity of the more distal branch vessels diffusely. Focal stenosis at the left MCA bifurcation, particularly affecting the inferior division. Electronically Signed   By: Paulina Fusi M.D.   On: 11/21/2019 17:30   MR ANGIO NECK W WO CONTRAST  Result Date: 11/21/2019 CLINICAL DATA:  Follow-up stroke presentation. EXAM: MRI HEAD WITHOUT AND WITH CONTRAST MRA HEAD WITHOUT CONTRAST MRA NECK WITHOUT AND WITH CONTRAST TECHNIQUE: Multiplanar, multiecho pulse sequences of the brain and surrounding structures were obtained without and with intravenous contrast. Angiographic images of the Circle of  Willis were obtained using MRA technique without intravenous contrast. Angiographic images of the neck were obtained using MRA technique without and with intravenous contrast. Carotid stenosis measurements (when applicable) are obtained utilizing NASCET criteria, using the distal internal carotid diameter as the denominator. CONTRAST:  32mL GADAVIST GADOBUTROL 1 MMOL/ML IV SOLN COMPARISON:  CT studies done yesterday.  MRI 02/06/2019 FINDINGS: MRI HEAD FINDINGS Brain: Diffusion imaging shows a subcentimeter region of acute infarction affecting the left lateral thalamus/posterior limb internal capsule. Old small vessel ischemic changes affect the pons. Old small vessel cerebellar infarctions. Old small vessel infarctions affect both thalami, both basal ganglia regions and are present throughout the deep and subcortical white matter of both hemispheres. No large vessel territory infarction. No mass lesion, acute hemorrhage, hydrocephalus or extra-axial collection. Vascular: Major vessels at the base of the brain show flow. Skull and upper cervical spine: Negative Sinuses/Orbits: Clear/normal Other: None MRA HEAD FINDINGS Both internal carotid arteries widely patent through the skull base and siphon regions. The anterior and middle cerebral vessels are patent. There is stenosis at the left MCA bifurcation, particularly affecting the inferior division. Multiple stenoses in the more peripheral branch vessels. Both vertebral arteries are patent through the foramen magnum. The right terminates in PICA. The left vertebral artery supplies the basilar. No basilar stenosis. Posterior circulation branch vessels show flow. Primary fetal origin of the left PCA. Atherosclerotic  narrowing the more distal branch vessels. MRA NECK FINDINGS Branching pattern is normal without origin stenosis. Both common carotid arteries are widely patent to the bifurcation. Both carotid bifurcations are widely patent. No stenosis or irregularity. Both  cervical internal carotid arteries show flow. Both vertebral arteries are patent with the left being dominant. IMPRESSION: 1. Acute subcentimeter infarction affecting the left lateral thalamus/posterior limb internal capsule. 2. Extensive chronic small-vessel ischemic changes throughout the brain as outlined above. 3. No significant carotid bifurcation disease. 4. Intracranial atherosclerotic disease. Atherosclerotic narrowing and irregularity of the more distal branch vessels diffusely. Focal stenosis at the left MCA bifurcation, particularly affecting the inferior division. Electronically Signed   By: Paulina Fusi M.D.   On: 11/21/2019 17:30   MR BRAIN WO CONTRAST  Result Date: 11/21/2019 CLINICAL DATA:  Follow-up stroke presentation. EXAM: MRI HEAD WITHOUT AND WITH CONTRAST MRA HEAD WITHOUT CONTRAST MRA NECK WITHOUT AND WITH CONTRAST TECHNIQUE: Multiplanar, multiecho pulse sequences of the brain and surrounding structures were obtained without and with intravenous contrast. Angiographic images of the Circle of Willis were obtained using MRA technique without intravenous contrast. Angiographic images of the neck were obtained using MRA technique without and with intravenous contrast. Carotid stenosis measurements (when applicable) are obtained utilizing NASCET criteria, using the distal internal carotid diameter as the denominator. CONTRAST:  59mL GADAVIST GADOBUTROL 1 MMOL/ML IV SOLN COMPARISON:  CT studies done yesterday.  MRI 02/06/2019 FINDINGS: MRI HEAD FINDINGS Brain: Diffusion imaging shows a subcentimeter region of acute infarction affecting the left lateral thalamus/posterior limb internal capsule. Old small vessel ischemic changes affect the pons. Old small vessel cerebellar infarctions. Old small vessel infarctions affect both thalami, both basal ganglia regions and are present throughout the deep and subcortical white matter of both hemispheres. No large vessel territory infarction. No mass lesion,  acute hemorrhage, hydrocephalus or extra-axial collection. Vascular: Major vessels at the base of the brain show flow. Skull and upper cervical spine: Negative Sinuses/Orbits: Clear/normal Other: None MRA HEAD FINDINGS Both internal carotid arteries widely patent through the skull base and siphon regions. The anterior and middle cerebral vessels are patent. There is stenosis at the left MCA bifurcation, particularly affecting the inferior division. Multiple stenoses in the more peripheral branch vessels. Both vertebral arteries are patent through the foramen magnum. The right terminates in PICA. The left vertebral artery supplies the basilar. No basilar stenosis. Posterior circulation branch vessels show flow. Primary fetal origin of the left PCA. Atherosclerotic narrowing the more distal branch vessels. MRA NECK FINDINGS Branching pattern is normal without origin stenosis. Both common carotid arteries are widely patent to the bifurcation. Both carotid bifurcations are widely patent. No stenosis or irregularity. Both cervical internal carotid arteries show flow. Both vertebral arteries are patent with the left being dominant. IMPRESSION: 1. Acute subcentimeter infarction affecting the left lateral thalamus/posterior limb internal capsule. 2. Extensive chronic small-vessel ischemic changes throughout the brain as outlined above. 3. No significant carotid bifurcation disease. 4. Intracranial atherosclerotic disease. Atherosclerotic narrowing and irregularity of the more distal branch vessels diffusely. Focal stenosis at the left MCA bifurcation, particularly affecting the inferior division. Electronically Signed   By: Paulina Fusi M.D.   On: 11/21/2019 17:30   ECHOCARDIOGRAM COMPLETE  Result Date: 11/22/2019    ECHOCARDIOGRAM REPORT   Patient Name:   James [name] Date of Exam: 11/22/2019 Medical Rec #:  [id number]     Height:       69.0 in Accession #:    [id number]  Weight:       240.0 lb Date of Birth:   [birth year]     BSA:          2.232 m Patient Age:    [age] years      BP:           137/85 mmHg Patient Gender: M             HR:           84 bpm. Exam Location:  Inpatient Procedure: 2D Echo Indications:    Stroke I163.9  History:        Patient has prior history of Echocardiogram examinations, most                 recent 09/11/2019. CHF, CAD, Prior CABG; Risk                 Factors:Hypertension, Dyslipidemia and Diabetes.  Sonographer:    Thurman Coyerasey Kirkpatrick RDCS (AE) Referring Phys: [id number] VISHAL R PATEL IMPRESSIONS  1. Endocardial border definition is poor. There appears to be mild septal hypokinesis, but systolic function.. Left ventricular ejection fraction, by estimation, is 45 to 50%. The left ventricle has mildly decreased function. The left ventricle demonstrates regional wall motion abnormalities (see scoring diagram/findings for description). There is mild concentric left ventricular hypertrophy. Left ventricular diastolic parameters are consistent with Grade I diastolic dysfunction (impaired relaxation). Elevated left ventricular end-diastolic pressure.  2. Right ventricular systolic function is normal. The right ventricular size is normal.  3. The mitral valve is normal in structure. Trivial mitral valve regurgitation. No evidence of mitral stenosis.  4. The aortic valve is normal in structure. Aortic valve regurgitation is not visualized. No aortic stenosis is present.  5. Aortic dilatation noted. There is mild dilatation at the level of the sinuses of Valsalva, measuring 39 mm. There is mild dilatation of the ascending aorta, measuring 40 mm.  6. The inferior vena cava is normal in size with greater than 50% respiratory variability, suggesting right atrial pressure of 3 mmHg. FINDINGS  Left Ventricle: Endocardial border definition is poor. There appears to be mild septal hypokinesis, but systolic function. Left ventricular ejection fraction, by estimation, is 45 to 50%. The left ventricle has mildly  decreased function. The left ventricle demonstrates regional wall motion abnormalities. The left ventricular internal cavity size was normal in size. There is mild concentric left ventricular hypertrophy. Left ventricular diastolic parameters are consistent with Grade I diastolic dysfunction (impaired relaxation). Elevated left ventricular end-diastolic pressure. Right Ventricle: The right ventricular size is normal. No increase in right ventricular wall thickness. Right ventricular systolic function is normal. Left Atrium: Left atrial size was normal in size. Right Atrium: Right atrial size was normal in size. Pericardium: There is no evidence of pericardial effusion. Mitral Valve: The mitral valve is normal in structure. Trivial mitral valve regurgitation. No evidence of mitral valve stenosis. Tricuspid Valve: The tricuspid valve is normal in structure. Tricuspid valve regurgitation is not demonstrated. No evidence of tricuspid stenosis. Aortic Valve: The aortic valve is normal in structure. Aortic valve regurgitation is not visualized. No aortic stenosis is present. Pulmonic Valve: The pulmonic valve was normal in structure. Pulmonic valve regurgitation is mild. No evidence of pulmonic stenosis. Aorta: Aortic dilatation noted. There is mild dilatation at the level of the sinuses of Valsalva, measuring 39 mm. There is mild dilatation of the ascending aorta, measuring 40 mm. Venous: The inferior vena cava is normal in size with greater than 50% respiratory  variability, suggesting right atrial pressure of 3 mmHg. IAS/Shunts: No atrial level shunt detected by color flow Doppler.  LEFT VENTRICLE PLAX 2D LVIDd:         5.75 cm  Diastology LVIDs:         4.68 cm  LV e' medial:    5.33 cm/s LV PW:         1.22 cm  LV E/e' medial:  17.7 LV IVS:        1.14 cm  LV e' lateral:   8.92 cm/s LVOT diam:     2.40 cm  LV E/e' lateral: 10.6 LV SV:         76 LV SV Index:   34 LVOT Area:     4.52 cm  RIGHT VENTRICLE RV S prime:      9.46 cm/s TAPSE (M-mode): 1.2 cm LEFT ATRIUM             Index LA diam:        4.40 cm 1.97 cm/m LA Vol (A2C):   59.6 ml 26.70 ml/m LA Vol (A4C):   63.0 ml 28.22 ml/m LA Biplane Vol: 63.5 ml 28.44 ml/m  AORTIC VALVE LVOT Vmax:   82.00 cm/s LVOT Vmean:  56.500 cm/s LVOT VTI:    0.167 m  AORTA Ao Root diam: 3.90 cm MITRAL VALVE MV Area (PHT): 3.46 cm     SHUNTS MV Decel Time: 219 msec     Systemic VTI:  0.17 m MV E velocity: 94.30 cm/s   Systemic Diam: 2.40 cm MV A velocity: 106.65 cm/s MV E/A ratio:  0.88 Chilton Si MD Electronically signed by Chilton Si MD Signature Date/Time: 11/22/2019/1:38:13 PM    Final    CT HEAD CODE STROKE WO CONTRAST  Result Date: 11/21/2019 CLINICAL DATA:  Code stroke. EXAM: CT HEAD WITHOUT CONTRAST TECHNIQUE: Contiguous axial images were obtained from the base of the skull through the vertex without intravenous contrast. COMPARISON:  09/09/2019 FINDINGS: Brain: No acute intracranial hemorrhage, mass effect, or edema. No new loss of gray-white differentiation. Multiple chronic small vessel infarcts are again identified involving the central white matter, basal ganglia, thalamus, and cerebellum. Ventricles are stable in size. Additional patchy and confluent areas of hypoattenuation in the supratentorial white matter are nonspecific but probably reflects stable chronic microvascular ischemic changes. Vascular: No hyperdense vessel. There is intracranial atherosclerotic calcification at the skull base. Skull: Unremarkable. Sinuses/Orbits: Head mild paranasal sinus mucosal thickening. No significant orbital abnormality. Other: Mastoid air cells are clear. ASPECTS ([place] Stroke Program Early CT Score) - Ganglionic level infarction (caudate, lentiform nuclei, internal capsule, insula, M1-M3 cortex): 7 - Supraganglionic infarction (M4-M6 cortex): 3 Total score (0-10 with 10 being normal): 10 IMPRESSION: No acute intracranial hemorrhage or evidence of acute infarction. ASPECT  score is 10. Stable chronic findings detailed above. These results were communicated to Dr. Otelia Limes at 2:57 pmon 10/4/2021by text page via the [facility] messaging system. Electronically Signed   By: Guadlupe Spanish M.D.   On: 11/21/2019 14:59        Scheduled Meds: . aspirin  81 mg Oral Daily  . atorvastatin  80 mg Oral Daily  . enoxaparin (LOVENOX) injection  40 mg Subcutaneous Q24H  . ezetimibe  10 mg Oral Daily  . insulin aspart  0-9 Units Subcutaneous TID WC  . insulin glargine  12 Units Subcutaneous QHS  . isosorbide mononitrate  60 mg Oral Daily  . ticagrelor  90 mg Oral BID  . torsemide  20 mg  Oral Daily   Continuous Infusions:   LOS: 1 day        Thalia Party, MD [facility] Pager [phone]  If 7PM-7AM, please contact night-coverage [email or web address] Password  11/22/2019, 7:36 PM

## 2019-11-22 NOTE — Progress Notes (Addendum)
STROKE TEAM PROGRESS NOTE    Interval History   No acute events overnight, stroke work up is complete   Pertinent Lab Work and Imaging    11/21/19 CT Head WO IV Contrast No acute intracranial hemorrhage, mass effect, or edema. No new loss of gray-white differentiation. Multiple chronic small vessel infarcts are again identified involving the central white matter, basal ganglia, thalamus, and cerebellum. Ventricles are stable in size. Additional patchy and confluent areas of hypoattenuation in the supratentorial white matter are nonspecific but probably reflects stable chronic microvascular ischemic changes.  11/21/19 MR Angio Head WO Contrast Both internal carotid arteries widely patent through the skull base and siphon regions. The anterior and middle cerebral vessels are patent. There is stenosis at the left MCA bifurcation, particularly affecting the inferior division. Multiple stenoses in the more peripheral branch vessels.  Both vertebral arteries are patent through the foramen magnum. The right terminates in PICA. The left vertebral artery supplies the basilar. No basilar stenosis. Posterior circulation branch vessels show flow. Primary fetal origin of the left PCA. Atherosclerotic narrowing the more distal branch vessels.  11/21/19 MR Angio Neck W WO Contrast  Branching pattern is normal without origin stenosis. Both common carotid arteries are widely patent to the bifurcation. Both carotid bifurcations are widely patent. No stenosis or irregularity. Both cervical internal carotid arteries show flow. Both vertebral arteries are patent with the left being dominant.  11/21/19 MRI Brain W WO IV Contrast Diffusion imaging shows a subcentimeter region of acute infarction affecting the left lateral thalamus/posterior limb internal capsule. Old small vessel ischemic changes affect the pons. Old small vessel cerebellar infarctions. Old small vessel infarctions affect both thalami, both basal  ganglia regions and are present throughout the deep and subcortical white matter of both hemispheres. No large vessel territory infarction. No mass lesion, acute hemorrhage, hydrocephalus or extra-axial collection.  11/22/19 Echocardiogram Complete  1. Endocardial border definition is poor. There appears to be mild septal hypokinesis, but systolic function.. Left ventricular ejection fraction, by estimation, is 45 to 50%. The left ventricle has mildly decreased function. The left ventricle demonstrates regional wall motion abnormalities (see scoring diagram/findings for description). There is mild concentric left ventricular hypertrophy. Left ventricular diastolic parameters are  consistent with Grade I diastolic dysfunction (impaired relaxation). Elevated left ventricular end-diastolic pressure.  2. Right ventricular systolic function is normal. The right ventricular size is normal.  3. The mitral valve is normal in structure. Trivial mitral valve regurgitation. No evidence of mitral stenosis.  4. The aortic valve is normal in structure. Aortic valve regurgitation is not visualized. No aortic stenosis is present.  5. Aortic dilatation noted. There is mild dilatation at the level of the sinuses of Valsalva, measuring 39 mm. There is mild dilatation of the ascending aorta, measuring 40 mm.  6. The inferior vena cava is normal in size with greater than 50% respiratory variability, suggesting right atrial pressure of 3 mmHg.   Physical Examination   Constitutional: Calm, appropriate for condition  Cardiovascular: Normal RR Respiratory: No increased WOB   Mental status: AAOx4 Speech: Fluent with repetition and naming intact  Cranial nerves: EOMI, VFF, Face symmetric, Sensation intact V1V2V3, Tongue midline, shoulder shrug intact  Motor: Normal bulk and tone. Drift noted to right arm only.   Dlt Bic Tri Grp HF  KnF KnE PIF DoF  R 5 5 5 5 5 5 5 5 5   L 5 5 5 5 5 5 5 5 5   Sensory:Intact to light  tough  throughout  Coordination: FNF + HTS intact  Reflexes: Deferred Gait: Deferred   NIHSS: 1 for right upper extremity drift   Assessment and Plan   [name] is a 74 y.o. male w/pmh of hypertension, hyperlipidemia, diabetes, chronic kidney disease, chronic systolic heart failure, atrial flutter and lacunar infarct in 2020 who presents after being found down in his home, found to have an acute left internal capsule stroke. He was outside of the time window for IVTPA and due to low NIHSS was not a candidate for mechanical thrombectomy.   #L Lateral Thalamus + Posterior Internal Capsule Stroke  Patient presented with the symptoms described above. At this time, his stroke work up is complete and the majority of his symptoms have resolved aside from right arm weakness. His MRI revealed acute stroke to the left lateral thalamus + posterior limb of the left internal capsule. Echocardiogram showed an EF of 45-50 % and was non revealing for intracardiac source of stroke. MRA Head and Neck was notable for stenosis at the left MCA bifurcation, particularly affecting the inferior division. Multiple stenoses in the more peripheral branch vessels. Stroke labs were completed including Lipid panel w/LDL 160 and Hemoglobin A1C 6.8. Considering his stroke size and location, more than likely it is in the setting of small vessel disease given his hypertension, hyperlipidemia w/LDL 160 and diabetes w/A1C 6.8. He was taking Aspirin and Plavix at home and states that he was compliant with taking his medications up until the end of September because he ran out of them. He had an ILR placed with Cardiology in March of this year, this was asked to be interrogated by the EP team today on 11/22/19 and per EP is negative for atrial fibrillation.  - Aspirin and Plavix have been resumed this admission and he will continue on them for secondary stroke prevention + for CAD ( placed on this regimen by Cardiology)  -Continue  Atorvastatin 80 mg HS for secondary stroke prevention  - PT/OT/ST evaluation  -At discharge he will need to follow up with stroke (Referral placed)    #Hypertension #CAD s/p CABG w/PCI to RCA x2 #Chronic Combined CHF  He has a history of CAD, Chronic Combined CHF and HTN for which he takes Lisinopril, Imdur and Torsemide at home. Currently blood pressures are trending 130-170.  Recommend permissive hypertension 24-48 hours post stroke and from there, gradually reduce the blood pressure, avoiding any acute drops. Long term blood pressure goal is < 130/80.  - Resume HF + HTN medications when permissive HTN window is over  - Continue to follow up with outpatient Cardiology   #Hyperlipidemia From a stroke prevention stand point, the LDL goal is < 70. His LDL is 160 for which his home Atorvastatin 80 was continued.    #Diabetes type II Uncontrolled  Hemoglobin A1C this admission noted to be 6.8. From a stroke stand point his A1C goal is < 7, he is at goal. He should continue with his diabetic regimen at discharge.   Neurology will sign off at this time given stroke work up is complete.  Hospital day # 1  Stark Jock, NP  [facility] Nurse Practitioner Patient seen and discussed with attending physician Dr. Roda Shutters   ATTENDING NOTE: I reviewed above note and agree with the assessment and plan. Pt was seen and examined.   74 year old male with history of hypertension, hyperlipidemia, diabetes, CKD, CHF, CAD, one episode of a flutter postop, stroke in 01/2019 admitted for right-sided weakness  right facial droop.  Patient had a stroke in 01/2019 with left arm numbness.  MRI showed right thalamic infarct.  CTA head and neck negative.  EF 40 to 45%.  LDL 140 and A1c 12.9.  UDS negative. Loop recorder placed. Patient discharged with aspirin and Brilinta and Lipitor 40.  This time, patient admitted for right-sided weakness, right facial droop, fell at home.  CT no acute abnormality.  MRI  showed left PLIC small infarct.  MRA head and neck showed left M1/M2 high-grade stenosis.  EF 45 to 50%.  UDS negative. Loop recorder interrogation no afib found.  A1c 6.8.  LDL 160.  Creatinine 1.3.  Patient stated that he is compliant with medication at home with aspirin 81, Plavix 75, Lipitor 80 and Zetia 10.  On exam, patient sitting in chair, awake alert, orientated x3.  No aphasia, mild dysarthria, able to name and repeat, follow simple commands.  No visual field deficit, gaze deviation bilaterally complete, right mild nasolabial fold flattening, tongue midline.  Right upper extremity mild pronator drift.  Right lower extremity 5 -/5.  Left upper and lower extremity 5/5.  Sensation symmetrical, FTN intact on the left, mild dysmetria on the right. Gait not tested but per PT/OT pt has mild right hemiparetic gait.   Pt current stroke still consistent with small vessel disease vs. Large vessel disease given left M1/M2 high grade stenosis. Recommend ASA 81 and brilinta 90 bid for 30 days and then go back to ASA 81 and plavix 75 daily. Continue lipitor 80 and zetia for now, recommend outpt follow up with PCP or cardiology regarding PCSK9 inhibitors.  Neurology will sign off. Please call with questions. Pt will follow up with stroke clinic NP at [facility] in about 4 weeks. Thanks for the consult.   Marvel Plan, MD PhD Stroke Neurology 11/22/2019 5:50 PM     To contact Stroke Continuity provider, please refer to [email or web address]. After hours, contact General Neurology

## 2019-11-23 ENCOUNTER — Other Ambulatory Visit (HOSPITAL_COMMUNITY): Payer: Self-pay | Admitting: Internal Medicine

## 2019-11-23 LAB — BASIC METABOLIC PANEL
Anion gap: 11 (ref 5–15)
BUN: 18 mg/dL (ref 8–23)
CO2: 26 mmol/L (ref 22–32)
Calcium: 8.9 mg/dL (ref 8.9–10.3)
Chloride: 100 mmol/L (ref 98–111)
Creatinine, Ser: 1.55 mg/dL — ABNORMAL HIGH (ref 0.61–1.24)
GFR calc non Af Amer: 43 mL/min — ABNORMAL LOW (ref >60)
Glucose, Bld: 102 mg/dL — ABNORMAL HIGH (ref 70–99)
Potassium: 3.3 mmol/L — ABNORMAL LOW (ref 3.5–5.1)
Sodium: 137 mmol/L (ref 135–145)

## 2019-11-23 LAB — GLUCOSE, CAPILLARY
Glucose-Capillary: 101 mg/dL — ABNORMAL HIGH (ref 70–99)
Glucose-Capillary: 106 mg/dL — ABNORMAL HIGH (ref 70–99)
Glucose-Capillary: 134 mg/dL — ABNORMAL HIGH (ref 70–99)
Glucose-Capillary: 129 mg/dL — ABNORMAL HIGH (ref 70–99)

## 2019-11-23 LAB — CBC
HCT: 38.7 % — ABNORMAL LOW (ref 39.0–52.0)
Hemoglobin: 12.8 g/dL — ABNORMAL LOW (ref 13.0–17.0)
MCH: 28.6 pg (ref 26.0–34.0)
MCHC: 33.1 g/dL (ref 30.0–36.0)
MCV: 86.4 fL (ref 80.0–100.0)
Platelets: 290 10*3/uL (ref 150–400)
RBC: 4.48 MIL/uL (ref 4.22–5.81)
RDW: 13.4 % (ref 11.5–15.5)
WBC: 9.6 10*3/uL (ref 4.0–10.5)
nRBC: 0 % (ref 0.0–0.2)

## 2019-11-23 MED ORDER — TICAGRELOR 90 MG PO TABS
90.0000 mg | ORAL_TABLET | Freq: Two times a day (BID) | ORAL | 0 refills | Status: DC
Start: 2019-11-23 — End: 2019-11-23

## 2019-11-23 MED FILL — BRILINTA 90 MG TABLET: 90 | 30 days supply | Qty: 60 | Fill #0

## 2019-11-23 NOTE — Discharge Summary (Signed)
Physician Discharge Summary  James [name] James [name] EXN:[id number] DOB: December 30, 1945 DOA: 11/21/2019  PCP: James Shiver, DO  Admit date: 11/21/2019 Discharge date: 11/23/2019  Admitted From: Home Disposition: Home  Recommendations for Outpatient Follow-up:  1. Follow up with PCP in 1-2 weeks 2. Please obtain BMP/CBC in one week 3. Please follow up on the following pending results:  Home Health: Home with home health services Equipment/Devices: None   Discharge Condition: Stable CODE STATUS: Full code Diet recommendation: Heart Healthy   Brief/Interim Summary:  Patient is a 74 y.o.malewith medical history significant forCAD s/p CABG, history of CVA with cryptogenic stroke in December 2020, chronic systolic CHF, CKD stage III, type 2 diabetes, hypertension, hyperlipidemia, and recurrent falls who presents to the ED for evaluation of right-sided weakness.CT head without contrast was negative for acute intracranial hemorrhage or evidence of acute infarct.Neurology were consulted and recommended further stroke work-up. He was not considered a TPA candidate as he was outside of the window.MRI brain without contrast, MRA head/neck showed an acute subcentimeter infarction affecting the left lateral thalamus/posterior limb internal capsule.Intracranial atherosclerotic disease also seen with focal stenosis at the left MCA bifurcation neurology evaluated the patient and to start the patient on aspirin, Brilinta and Lipitor. A1c was 6.8.  Echocardiogram showed ejection fraction of 45 to 50% and was not positive for intracardiac source of stroke.  PT/OT evaluated the patient and recommended skilled nursing facility and the patient denied.  Patient will be discharged home with home health PT, home health OT, social worker and nursing aide.  Patient have to follow-up with neurology in 4 weeks.  Discharge plan is discussed with patient in detail and patient agreed with the plan and verbalizes  understanding.  Discharge Diagnoses:  Principal Problem:   Acute CVA (cerebrovascular accident) (HCC) Active Problems:   Hypertension associated with diabetes (HCC)   Coronary artery disease   DM (diabetes mellitus), type 2, uncontrolled with complications (HCC)   Chronic systolic CHF (congestive heart failure) (HCC)   CKD (chronic kidney disease), stage III (HCC)   Hyperlipidemia associated with type 2 diabetes mellitus [name]    Discharge Instructions  Discharge Instructions    Ambulatory referral to Neurology   Complete by: As directed    Follow up with stroke clinic NP (Jessica Vanschaick or Darrol Angel, if both not available, consider Manson Allan, or Ahern) at [facility] in about 4 weeks. Thanks.   Diet - low sodium heart healthy   Complete by: As directed    Discharge instructions   Complete by: As directed    Take all your medications as prescribed.  Follow-up with stroke clinic in 4 weeks and follow-up with your PCP in a week regarding PCSK9 inhibitor as per neurology recommendations.   Increase activity slowly   Complete by: As directed      Allergies as of 11/23/2019      Reactions   Lisinopril Cough   Patient can take lisinopril now      Medication List    STOP taking these medications   clopidogrel 75 MG tablet Commonly known as: PLAVIX     TAKE these medications   acetaminophen 500 MG tablet Commonly known as: TYLENOL Take 1,000 mg by mouth every 6 (six) hours as needed for headache.   aspirin 81 MG chewable tablet Chew 81 mg by mouth daily.   atorvastatin 80 MG tablet Commonly known as: LIPITOR Take 1 tablet (80 mg total) by mouth daily.   cholecalciferol 1000 units tablet Commonly known as:  VITAMIN D Take 1,000 Units by mouth daily.   ezetimibe 10 MG tablet Commonly known as: ZETIA TAKE 1 TABLET (10 MG TOTAL) BY MOUTH DAILY. PLEASE MAKE OVERDUE APPT WITH DR. Mayford Knife BEFORE ANYMORE REFILLS.   fluticasone 50 MCG/ACT nasal spray Commonly known  as: FLONASE Place 1 spray into both nostrils daily as needed for allergies.   insulin aspart 100 UNIT/ML injection Commonly known as: novoLOG Inject 8 Units into the skin every morning.   iron polysaccharides 150 MG capsule Commonly known as: NIFEREX Take 1 capsule (150 mg total) by mouth daily.   isosorbide mononitrate 60 MG 24 hr tablet Commonly known as: IMDUR TAKE 1 TABLET (60 MG TOTAL) BY MOUTH DAILY. What changed: See the new instructions.   lisinopril 20 MG tablet Commonly known as: ZESTRIL Take 20 mg by mouth daily.   metFORMIN 1000 MG tablet Commonly known as: GLUCOPHAGE Take 1,000 mg by mouth daily with breakfast.   metoprolol succinate 50 MG 24 hr tablet Commonly known as: TOPROL-XL Take 1 tablet (50 mg total) by mouth daily. Take with or immediately following a meal.   multivitamin with minerals Tabs tablet Take 1 tablet by mouth daily.   nitroGLYCERIN 0.4 MG SL tablet Commonly known as: Nitrostat Place 1 tablet (0.4 mg total) under the tongue every 5 (five) minutes as needed for chest pain.   Omega-3 1000 MG Caps Take 2,000 mg by mouth daily.   Ozempic (0.25 or 0.5 MG/DOSE) 2 MG/1.5ML Sopn Generic drug: Semaglutide(0.25 or 0.5MG /DOS) Inject 1 mg into the skin every Sunday.   polyethylene glycol 17 g packet Commonly known as: MiraLax Take 17 g by mouth daily. What changed:   when to take this  reasons to take this   potassium chloride 10 MEQ tablet Commonly known as: KLOR-CON TAKE 1 TABLET (10 MEQ TOTAL) BY MOUTH SEE ADMIN INSTRUCTIONS. PLEASE MAKE APT FOR FUTURE REFILLS 1ST ATTEMPT [phone].   pramipexole 0.75 MG tablet Commonly known as: MIRAPEX Take 1.5 mg by mouth every evening.   tamsulosin 0.4 MG Caps capsule Commonly known as: FLOMAX Take 0.4 mg by mouth daily.   ticagrelor 90 MG Tabs tablet Commonly known as: BRILINTA Take 1 tablet (90 mg total) by mouth 2 (two) times daily.   torsemide 20 MG tablet Commonly known as:  DEMADEX Take 1 tablet (20 mg total) by mouth daily.   Toujeo SoloStar 300 UNIT/ML Solostar Pen Generic drug: insulin glargine (1 Unit Dial) Inject 21 Units into the skin at bedtime.       Follow-up Information    [facility]. Schedule an appointment as soon as possible for a visit in 4 week(s).   Specialty: Neurology Contact information: [address] [place] [address] [phone]             Allergies  Allergen Reactions  . Lisinopril Cough    Patient can take lisinopril now    Consultations  Neurology 4 weeks and PCP in a week   Procedures/Studies: MR ANGIO HEAD WO CONTRAST  Result Date: 11/21/2019 CLINICAL DATA:  Follow-up stroke presentation. EXAM: MRI HEAD WITHOUT AND WITH CONTRAST MRA HEAD WITHOUT CONTRAST MRA NECK WITHOUT AND WITH CONTRAST TECHNIQUE: Multiplanar, multiecho pulse sequences of the brain and surrounding structures were obtained without and with intravenous contrast. Angiographic images of the Circle of Willis were obtained using MRA technique without intravenous contrast. Angiographic images of the neck were obtained using MRA technique without and with intravenous contrast. Carotid stenosis measurements (  when applicable) are obtained utilizing NASCET criteria, using the distal internal carotid diameter as the denominator. CONTRAST:  10mL GADAVIST GADOBUTROL 1 MMOL/ML IV SOLN COMPARISON:  CT studies done yesterday.  MRI 02/06/2019 FINDINGS: MRI HEAD FINDINGS Brain: Diffusion imaging shows a subcentimeter region of acute infarction affecting the left lateral thalamus/posterior limb internal capsule. Old small vessel ischemic changes affect the pons. Old small vessel cerebellar infarctions. Old small vessel infarctions affect both thalami, both basal ganglia regions and are present throughout the deep and subcortical white matter of both hemispheres. No large vessel territory infarction. No mass lesion, acute  hemorrhage, hydrocephalus or extra-axial collection. Vascular: Major vessels at the base of the brain show flow. Skull and upper cervical spine: Negative Sinuses/Orbits: Clear/normal Other: None MRA HEAD FINDINGS Both internal carotid arteries widely patent through the skull base and siphon regions. The anterior and middle cerebral vessels are patent. There is stenosis at the left MCA bifurcation, particularly affecting the inferior division. Multiple stenoses in the more peripheral branch vessels. Both vertebral arteries are patent through the foramen magnum. The right terminates in PICA. The left vertebral artery supplies the basilar. No basilar stenosis. Posterior circulation branch vessels show flow. Primary fetal origin of the left PCA. Atherosclerotic narrowing the more distal branch vessels. MRA NECK FINDINGS Branching pattern is normal without origin stenosis. Both common carotid arteries are widely patent to the bifurcation. Both carotid bifurcations are widely patent. No stenosis or irregularity. Both cervical internal carotid arteries show flow. Both vertebral arteries are patent with the left being dominant. IMPRESSION: 1. Acute subcentimeter infarction affecting the left lateral thalamus/posterior limb internal capsule. 2. Extensive chronic small-vessel ischemic changes throughout the brain as outlined above. 3. No significant carotid bifurcation disease. 4. Intracranial atherosclerotic disease. Atherosclerotic narrowing and irregularity of the more distal branch vessels diffusely. Focal stenosis at the left MCA bifurcation, particularly affecting the inferior division. Electronically Signed   By: Paulina Fusi M.D.   On: 11/21/2019 17:30   MR ANGIO NECK W WO CONTRAST  Result Date: 11/21/2019 CLINICAL DATA:  Follow-up stroke presentation. EXAM: MRI HEAD WITHOUT AND WITH CONTRAST MRA HEAD WITHOUT CONTRAST MRA NECK WITHOUT AND WITH CONTRAST TECHNIQUE: Multiplanar, multiecho pulse sequences of the brain  and surrounding structures were obtained without and with intravenous contrast. Angiographic images of the Circle of Willis were obtained using MRA technique without intravenous contrast. Angiographic images of the neck were obtained using MRA technique without and with intravenous contrast. Carotid stenosis measurements (when applicable) are obtained utilizing NASCET criteria, using the distal internal carotid diameter as the denominator. CONTRAST:  10mL GADAVIST GADOBUTROL 1 MMOL/ML IV SOLN COMPARISON:  CT studies done yesterday.  MRI 02/06/2019 FINDINGS: MRI HEAD FINDINGS Brain: Diffusion imaging shows a subcentimeter region of acute infarction affecting the left lateral thalamus/posterior limb internal capsule. Old small vessel ischemic changes affect the pons. Old small vessel cerebellar infarctions. Old small vessel infarctions affect both thalami, both basal ganglia regions and are present throughout the deep and subcortical white matter of both hemispheres. No large vessel territory infarction. No mass lesion, acute hemorrhage, hydrocephalus or extra-axial collection. Vascular: Major vessels at the base of the brain show flow. Skull and upper cervical spine: Negative Sinuses/Orbits: Clear/normal Other: None MRA HEAD FINDINGS Both internal carotid arteries widely patent through the skull base and siphon regions. The anterior and middle cerebral vessels are patent. There is stenosis at the left MCA bifurcation, particularly affecting the inferior division. Multiple stenoses in the more peripheral branch vessels. Both vertebral arteries  are patent through the foramen magnum. The right terminates in PICA. The left vertebral artery supplies the basilar. No basilar stenosis. Posterior circulation branch vessels show flow. Primary fetal origin of the left PCA. Atherosclerotic narrowing the more distal branch vessels. MRA NECK FINDINGS Branching pattern is normal without origin stenosis. Both common carotid arteries  are widely patent to the bifurcation. Both carotid bifurcations are widely patent. No stenosis or irregularity. Both cervical internal carotid arteries show flow. Both vertebral arteries are patent with the left being dominant. IMPRESSION: 1. Acute subcentimeter infarction affecting the left lateral thalamus/posterior limb internal capsule. 2. Extensive chronic small-vessel ischemic changes throughout the brain as outlined above. 3. No significant carotid bifurcation disease. 4. Intracranial atherosclerotic disease. Atherosclerotic narrowing and irregularity of the more distal branch vessels diffusely. Focal stenosis at the left MCA bifurcation, particularly affecting the inferior division. Electronically Signed   By: Paulina Fusi M.D.   On: 11/21/2019 17:30   MR BRAIN WO CONTRAST  Result Date: 11/21/2019 CLINICAL DATA:  Follow-up stroke presentation. EXAM: MRI HEAD WITHOUT AND WITH CONTRAST MRA HEAD WITHOUT CONTRAST MRA NECK WITHOUT AND WITH CONTRAST TECHNIQUE: Multiplanar, multiecho pulse sequences of the brain and surrounding structures were obtained without and with intravenous contrast. Angiographic images of the Circle of Willis were obtained using MRA technique without intravenous contrast. Angiographic images of the neck were obtained using MRA technique without and with intravenous contrast. Carotid stenosis measurements (when applicable) are obtained utilizing NASCET criteria, using the distal internal carotid diameter as the denominator. CONTRAST:  10mL GADAVIST GADOBUTROL 1 MMOL/ML IV SOLN COMPARISON:  CT studies done yesterday.  MRI 02/06/2019 FINDINGS: MRI HEAD FINDINGS Brain: Diffusion imaging shows a subcentimeter region of acute infarction affecting the left lateral thalamus/posterior limb internal capsule. Old small vessel ischemic changes affect the pons. Old small vessel cerebellar infarctions. Old small vessel infarctions affect both thalami, both basal ganglia regions and are present  throughout the deep and subcortical white matter of both hemispheres. No large vessel territory infarction. No mass lesion, acute hemorrhage, hydrocephalus or extra-axial collection. Vascular: Major vessels at the base of the brain show flow. Skull and upper cervical spine: Negative Sinuses/Orbits: Clear/normal Other: None MRA HEAD FINDINGS Both internal carotid arteries widely patent through the skull base and siphon regions. The anterior and middle cerebral vessels are patent. There is stenosis at the left MCA bifurcation, particularly affecting the inferior division. Multiple stenoses in the more peripheral branch vessels. Both vertebral arteries are patent through the foramen magnum. The right terminates in PICA. The left vertebral artery supplies the basilar. No basilar stenosis. Posterior circulation branch vessels show flow. Primary fetal origin of the left PCA. Atherosclerotic narrowing the more distal branch vessels. MRA NECK FINDINGS Branching pattern is normal without origin stenosis. Both common carotid arteries are widely patent to the bifurcation. Both carotid bifurcations are widely patent. No stenosis or irregularity. Both cervical internal carotid arteries show flow. Both vertebral arteries are patent with the left being dominant. IMPRESSION: 1. Acute subcentimeter infarction affecting the left lateral thalamus/posterior limb internal capsule. 2. Extensive chronic small-vessel ischemic changes throughout the brain as outlined above. 3. No significant carotid bifurcation disease. 4. Intracranial atherosclerotic disease. Atherosclerotic narrowing and irregularity of the more distal branch vessels diffusely. Focal stenosis at the left MCA bifurcation, particularly affecting the inferior division. Electronically Signed   By: Paulina Fusi M.D.   On: 11/21/2019 17:30   ECHOCARDIOGRAM COMPLETE  Result Date: 11/22/2019    ECHOCARDIOGRAM REPORT   Patient Name:  James [name] Date of Exam: 11/22/2019  Medical Rec #:  [id number]     Height:       69.0 in Accession #:    [id number]    Weight:       240.0 lb Date of Birth:  11/20/1945     BSA:          2.232 m Patient Age:    74 years      BP:           137/85 mmHg Patient Gender: M             HR:           84 bpm. Exam Location:  Inpatient Procedure: 2D Echo Indications:    Stroke I163.9  History:        Patient has prior history of Echocardiogram examinations, most                 recent 09/11/2019. CHF, CAD, Prior CABG; Risk                 Factors:Hypertension, Dyslipidemia and Diabetes.  Sonographer:    Thurman Coyer RDCS (AE) Referring Phys: [id number] VISHAL R PATEL IMPRESSIONS  1. Endocardial border definition is poor. There appears to be mild septal hypokinesis, but systolic function.. Left ventricular ejection fraction, by estimation, is 45 to 50%. The left ventricle has mildly decreased function. The left ventricle demonstrates regional wall motion abnormalities (see scoring diagram/findings for description). There is mild concentric left ventricular hypertrophy. Left ventricular diastolic parameters are consistent with Grade I diastolic dysfunction (impaired relaxation). Elevated left ventricular end-diastolic pressure.  2. Right ventricular systolic function is normal. The right ventricular size is normal.  3. The mitral valve is normal in structure. Trivial mitral valve regurgitation. No evidence of mitral stenosis.  4. The aortic valve is normal in structure. Aortic valve regurgitation is not visualized. No aortic stenosis is present.  5. Aortic dilatation noted. There is mild dilatation at the level of the sinuses of Valsalva, measuring 39 mm. There is mild dilatation of the ascending aorta, measuring 40 mm.  6. The inferior vena cava is normal in size with greater than 50% respiratory variability, suggesting right atrial pressure of 3 mmHg. FINDINGS  Left Ventricle: Endocardial border definition is poor. There appears to be mild septal hypokinesis,  but systolic function. Left ventricular ejection fraction, by estimation, is 45 to 50%. The left ventricle has mildly decreased function. The left ventricle demonstrates regional wall motion abnormalities. The left ventricular internal cavity size was normal in size. There is mild concentric left ventricular hypertrophy. Left ventricular diastolic parameters are consistent with Grade I diastolic dysfunction (impaired relaxation). Elevated left ventricular end-diastolic pressure. Right Ventricle: The right ventricular size is normal. No increase in right ventricular wall thickness. Right ventricular systolic function is normal. Left Atrium: Left atrial size was normal in size. Right Atrium: Right atrial size was normal in size. Pericardium: There is no evidence of pericardial effusion. Mitral Valve: The mitral valve is normal in structure. Trivial mitral valve regurgitation. No evidence of mitral valve stenosis. Tricuspid Valve: The tricuspid valve is normal in structure. Tricuspid valve regurgitation is not demonstrated. No evidence of tricuspid stenosis. Aortic Valve: The aortic valve is normal in structure. Aortic valve regurgitation is not visualized. No aortic stenosis is present. Pulmonic Valve: The pulmonic valve was normal in structure. Pulmonic valve regurgitation is mild. No evidence of pulmonic stenosis. Aorta: Aortic dilatation noted. There is mild dilatation at the  level of the sinuses of Valsalva, measuring 39 mm. There is mild dilatation of the ascending aorta, measuring 40 mm. Venous: The inferior vena cava is normal in size with greater than 50% respiratory variability, suggesting right atrial pressure of 3 mmHg. IAS/Shunts: No atrial level shunt detected by color flow Doppler.  LEFT VENTRICLE PLAX 2D LVIDd:         5.75 cm  Diastology LVIDs:         4.68 cm  LV e' medial:    5.33 cm/s LV PW:         1.22 cm  LV E/e' medial:  17.7 LV IVS:        1.14 cm  LV e' lateral:   8.92 cm/s LVOT diam:     2.40  cm  LV E/e' lateral: 10.6 LV SV:         76 LV SV Index:   34 LVOT Area:     4.52 cm  RIGHT VENTRICLE RV S prime:     9.46 cm/s TAPSE (M-mode): 1.2 cm LEFT ATRIUM             Index LA diam:        4.40 cm 1.97 cm/m LA Vol (A2C):   59.6 ml 26.70 ml/m LA Vol (A4C):   63.0 ml 28.22 ml/m LA Biplane Vol: 63.5 ml 28.44 ml/m  AORTIC VALVE LVOT Vmax:   82.00 cm/s LVOT Vmean:  56.500 cm/s LVOT VTI:    0.167 m  AORTA Ao Root diam: 3.90 cm MITRAL VALVE MV Area (PHT): 3.46 cm     SHUNTS MV Decel Time: 219 msec     Systemic VTI:  0.17 m MV E velocity: 94.30 cm/s   Systemic Diam: 2.40 cm MV A velocity: 106.65 cm/s MV E/A ratio:  0.88 Chilton Si MD Electronically signed by Chilton Si MD Signature Date/Time: 11/22/2019/1:38:13 PM    Final    CT HEAD CODE STROKE WO CONTRAST  Result Date: 11/21/2019 CLINICAL DATA:  Code stroke. EXAM: CT HEAD WITHOUT CONTRAST TECHNIQUE: Contiguous axial images were obtained from the base of the skull through the vertex without intravenous contrast. COMPARISON:  09/09/2019 FINDINGS: Brain: No acute intracranial hemorrhage, mass effect, or edema. No new loss of gray-white differentiation. Multiple chronic small vessel infarcts are again identified involving the central white matter, basal ganglia, thalamus, and cerebellum. Ventricles are stable in size. Additional patchy and confluent areas of hypoattenuation in the supratentorial white matter are nonspecific but probably reflects stable chronic microvascular ischemic changes. Vascular: No hyperdense vessel. There is intracranial atherosclerotic calcification at the skull base. Skull: Unremarkable. Sinuses/Orbits: Head mild paranasal sinus mucosal thickening. No significant orbital abnormality. Other: Mastoid air cells are clear. ASPECTS (Alberta Stroke Program Early CT Score) - Ganglionic level infarction (caudate, lentiform nuclei, internal capsule, insula, M1-M3 cortex): 7 - Supraganglionic infarction (M4-M6 cortex): 3 Total  score (0-10 with 10 being normal): 10 IMPRESSION: No acute intracranial hemorrhage or evidence of acute infarction. ASPECT score is 10. Stable chronic findings detailed above. These results were communicated to Dr. Otelia Limes at 2:57 pmon 10/4/2021by text page via the [name] messaging system. Electronically Signed   By: Guadlupe Spanish M.D.   On: 11/21/2019 14:59    (Echo, Carotid, EGD, Colonoscopy, ERCP)    Subjective:   Discharge Exam: Vitals:   11/23/19 0822 11/23/19 1100  BP: 139/88   Pulse: 79   Resp:    Temp: 98.7 F (37.1 C)   SpO2: 96% 96%   Vitals:  11/23/19 0007 11/23/19 0353 11/23/19 0822 11/23/19 1100  BP: (!) 149/78 (!) 157/77 139/88   Pulse: 82 77 79   Resp: 19 16    Temp: 98.5 F (36.9 C) 97.9 F (36.6 C) 98.7 F (37.1 C)   TempSrc: Oral Oral Oral   SpO2: 96% 97% 96% 96%  Weight:      Height:        General: Pt is alert, awake, not in acute distress Cardiovascular: RRR, S1/S2 +, no rubs, no gallops Respiratory: CTA bilaterally, no wheezing, no rhonchi Abdominal: Soft, NT, ND, bowel sounds + Extremities: no edema, no cyanosis Neurology: Patient is awake alert and oriented to time person and place.  Normal speech.  Cranial nerves II to XII grossly intact.  Muscle power and sensations intact throughout.    The results of significant diagnostics from this hospitalization (including imaging, microbiology, ancillary and laboratory) are listed below for reference.     Microbiology: Recent Results (from the past 240 hour(s))  Respiratory Panel by RT PCR (Flu A&B, Covid) - Nasopharyngeal Swab     Status: None   Collection Time: 11/21/19  8:58 PM   Specimen: Nasopharyngeal Swab  Result Value Ref Range Status   SARS Coronavirus 2 by RT PCR NEGATIVE NEGATIVE Final    Comment: (NOTE) SARS-CoV-2 target nucleic acids are NOT DETECTED.  The SARS-CoV-2 RNA is generally detectable in upper respiratoy specimens during the acute phase of infection. The  lowest concentration of SARS-CoV-2 viral copies this assay can detect is 131 copies/mL. A negative result does not preclude SARS-Cov-2 infection and should not be used as the sole basis for treatment or other patient management decisions. A negative result may occur with  improper specimen collection/handling, submission of specimen other than nasopharyngeal swab, presence of viral mutation(s) within the areas targeted by this assay, and inadequate number of viral copies (<131 copies/mL). A negative result must be combined with clinical observations, patient history, and epidemiological information. The expected result is Negative.  Fact Sheet for Patients:  https://www.moore.com/  Fact Sheet for Healthcare Providers:  https://www.young.biz/  This test is no t yet approved or cleared by the [place] FDA and  has been authorized for detection and/or diagnosis of SARS-CoV-2 by FDA under an Emergency Use Authorization (EUA). This EUA will remain  in effect (meaning this test can be used) for the duration of the COVID-19 declaration under Section 564(b)(1) of the Act, 21 U.S.C. section 360bbb-3(b)(1), unless the authorization is terminated or revoked sooner.     Influenza A by PCR NEGATIVE NEGATIVE Final   Influenza B by PCR NEGATIVE NEGATIVE Final    Comment: (NOTE) The Xpert Xpress SARS-CoV-2/FLU/RSV assay is intended as an aid in  the diagnosis of influenza from Nasopharyngeal swab specimens and  should not be used as a sole basis for treatment. Nasal washings and  aspirates are unacceptable for Xpert Xpress SARS-CoV-2/FLU/RSV  testing.  Fact Sheet for Patients: https://www.moore.com/  Fact Sheet for Healthcare Providers: https://www.young.biz/  This test is not yet approved or cleared by the [place] FDA and  has been authorized for detection and/or diagnosis of SARS-CoV-2 by  FDA under  an Emergency Use Authorization (EUA). This EUA will remain  in effect (meaning this test can be used) for the duration of the  Covid-19 declaration under Section 564(b)(1) of the Act, 21  U.S.C. section 360bbb-3(b)(1), unless the authorization is  terminated or revoked. Performed at [name], [address] [place], [place] [address]  Labs: BNP (last 3 results) Recent Labs    12/20/18 1710 02/05/19 2310 09/09/19 0138  BNP 246.5* 238.2* 175.1*   Basic Metabolic Panel: Recent Labs  Lab 11/21/19 1440 11/21/19 1450 11/23/19 0414  NA 139 141 137  K 3.7 3.7 3.3*  CL 103 101 100  CO2 26  --  26  GLUCOSE 126* 124* 102*  BUN 14 15 18   CREATININE 1.29* 1.30* 1.55*  CALCIUM 9.4  --  8.9   Liver Function Tests: Recent Labs  Lab 11/21/19 1440  AST 21  ALT 19  ALKPHOS 82  BILITOT 0.7  PROT 7.3  ALBUMIN 3.8   No results for input(s): LIPASE, AMYLASE in the last 168 hours. No results for input(s): AMMONIA in the last 168 hours. CBC: Recent Labs  Lab 11/21/19 1440 11/21/19 1450 11/23/19 0414  WBC 9.2  --  9.6  NEUTROABS 6.9  --   --   HGB 13.7 13.9 12.8*  HCT 40.8 41.0 38.7*  MCV 85.9  --  86.4  PLT 277  --  290   Cardiac Enzymes: No results for input(s): CKTOTAL, CKMB, CKMBINDEX, TROPONINI in the last 168 hours. BNP: Invalid input(s): POCBNP CBG: Recent Labs  Lab 11/22/19 1548 11/22/19 2132 11/23/19 0626 11/23/19 0826 11/23/19 1202  GLUCAP 156* 79 101* 106* 134*   D-Dimer No results for input(s): DDIMER in the last 72 hours. Hgb A1c Recent Labs    11/22/19 0208  HGBA1C 6.8*   Lipid Profile Recent Labs    11/22/19 0208  CHOL 229*  HDL 42  LDLCALC 160*  TRIG 134  CHOLHDL 5.5   Thyroid function studies No results for input(s): TSH, T4TOTAL, T3FREE, THYROIDAB in the last 72 hours.  Invalid input(s): FREET3 Anemia work up No results for input(s): VITAMINB12, FOLATE, FERRITIN, TIBC, IRON, RETICCTPCT in the last 72  hours. Urinalysis    Component Value Date/Time   COLORURINE STRAW (A) 11/22/2019 0151   APPEARANCEUR CLEAR 11/22/2019 0151   LABSPEC 1.010 11/22/2019 0151   PHURINE 7.0 11/22/2019 0151   GLUCOSEU NEGATIVE 11/22/2019 0151   HGBUR SMALL (A) 11/22/2019 0151   BILIRUBINUR NEGATIVE 11/22/2019 0151   KETONESUR NEGATIVE 11/22/2019 0151   PROTEINUR 100 (A) 11/22/2019 0151   UROBILINOGEN 1.0 10/24/2011 1742   NITRITE NEGATIVE 11/22/2019 0151   LEUKOCYTESUR NEGATIVE 11/22/2019 0151   Sepsis Labs Invalid input(s): PROCALCITONIN,  WBC,  LACTICIDVEN Microbiology Recent Results (from the past 240 hour(s))  Respiratory Panel by RT PCR (Flu A&B, Covid) - Nasopharyngeal Swab     Status: None   Collection Time: 11/21/19  8:58 PM   Specimen: Nasopharyngeal Swab  Result Value Ref Range Status   SARS Coronavirus 2 by RT PCR NEGATIVE NEGATIVE Final    Comment: (NOTE) SARS-CoV-2 target nucleic acids are NOT DETECTED.  The SARS-CoV-2 RNA is generally detectable in upper respiratoy specimens during the acute phase of infection. The lowest concentration of SARS-CoV-2 viral copies this assay can detect is 131 copies/mL. A negative result does not preclude SARS-Cov-2 infection and should not be used as the sole basis for treatment or other patient management decisions. A negative result may occur with  improper specimen collection/handling, submission of specimen other than nasopharyngeal swab, presence of viral mutation(s) within the areas targeted by this assay, and inadequate number of viral copies (<131 copies/mL). A negative result must be combined with clinical observations, patient history, and epidemiological information. The expected result is Negative.  Fact Sheet for Patients:  https://www.moore.com/https://www.fda.gov/media/142436/download  Fact Sheet for  Healthcare Providers:  https://www.young.biz/  This test is no t yet approved or cleared by the [place] and  has been  authorized for detection and/or diagnosis of SARS-CoV-2 by FDA under an Emergency Use Authorization (EUA). This EUA will remain  in effect (meaning this test can be used) for the duration of the COVID-19 declaration under Section 564(b)(1) of the Act, 21 U.S.C. section 360bbb-3(b)(1), unless the authorization is terminated or revoked sooner.     Influenza A by PCR NEGATIVE NEGATIVE Final   Influenza B by PCR NEGATIVE NEGATIVE Final    Comment: (NOTE) The Xpert Xpress SARS-CoV-2/FLU/RSV assay is intended as an aid in  the diagnosis of influenza from Nasopharyngeal swab specimens and  should not be used as a sole basis for treatment. Nasal washings and  aspirates are unacceptable for Xpert Xpress SARS-CoV-2/FLU/RSV  testing.  Fact Sheet for Patients: https://www.moore.com/  Fact Sheet for Healthcare Providers: https://www.young.biz/  This test is not yet approved or cleared by the [place] FDA and  has been authorized for detection and/or diagnosis of SARS-CoV-2 by  FDA under an Emergency Use Authorization (EUA). This EUA will remain  in effect (meaning this test can be used) for the duration of the  Covid-19 declaration under Section 564(b)(1) of the Act, 21  U.S.C. section 360bbb-3(b)(1), unless the authorization is  terminated or revoked. Performed at [name], [address], [place], [place] [address]      Time coordinating discharge: Over 30 minutes  SIGNED:   Thalia Party, MD  [facility] 11/23/2019, 2:49 PM Pager   If 7PM-7AM, please contact night-coverage [email or web address] Password [email or web address]

## 2019-11-23 NOTE — Progress Notes (Signed)
Physical Therapy Treatment Patient Details Name: James [name] MRN: [id number] DOB: 29-Sep-1945 Today's Date: 11/23/2019    History of Present Illness 74 y.o. male with medical history significant for CAD s/p CABG, history of CVA with cryptogenic stroke in December 2020, chronic systolic CHF, CKD stage III, type 2 diabetes, hypertension, hyperlipidemia, and recurrent falls who presents to the ED on 10/4 for evaluation of right-sided weakness. MRI reveals small remote lacunar infarct L BG, L lateral thalamus and posterior limb IC CVA.    PT Comments    Pt continues to display unsteadiness with gait and had a LOB posteriorly when turning, resulting in him requiring minA to regain his balance. He denies noticing his LOB or safety concerns with returning home. He denies SNF still and thus pt educated on safe mobility with appropriate assistive device within his home and recommendations for [facility] PT and an aide if possible. Pt reports feeling steady with his rollator at home, even after being educated on benefits of RW to provide more stability and inc his safety. Will continue to follow-up with pt to provide acute PT services to address his mentioned deficits to maximize his safety and independence with functional mobility.  Follow Up Recommendations  SNF;Home health PT;Supervision/Assistance - 24 hour (if pt declines SNF then [facility] PT with aide if possible)     Equipment Recommendations  None recommended by PT    Recommendations for Other Services       Precautions / Restrictions Precautions Precautions: Fall Precaution Comments: foley catheter Restrictions Weight Bearing Restrictions: No    Mobility  Bed Mobility                  Transfers Overall transfer level: Needs assistance Equipment used: Rolling walker (2 wheeled) Transfers: Sit to/from Stand Sit to Stand: Min guard         General transfer comment: STS from bedside chair with inc time and min guard for safety as pt  displays unsteadiness with hand transfer from chair to RW and obtaining initial standing balance.  Ambulation/Gait Ambulation/Gait assistance: Min guard;Min assist Gait Distance (Feet): 40 Feet Assistive device: Rolling walker (2 wheeled) Gait Pattern/deviations: Step-through pattern;Decreased step length - right (decreased B feet clearance; trunk flexion; narrow BOS) Gait velocity: decr Gait velocity interpretation: <1.31 ft/sec, indicative of household ambulator General Gait Details: Min guard for safety and intermittent minA to regain balance with 1 posterior LOB during turn. VC's to inc R step length for more symmetrical gait pattern, with momentary success. VC's for safe turns through moving slowly RW then feet and to turn little at a time to complete turn, min success.   Stairs             Wheelchair Mobility    Modified Rankin (Stroke Patients Only) Modified Rankin (Stroke Patients Only) Pre-Morbid Rankin Score: Moderate disability Modified Rankin: Moderately severe disability     Balance Overall balance assessment: Needs assistance;History of Falls         Standing balance support: Bilateral upper extremity supported Standing balance-Leahy Scale: Poor Standing balance comment: Requires B UE support on RW to maintain static standing balance with min guard.                            Cognition Arousal/Alertness: Awake/alert Behavior During Therapy: WFL for tasks assessed/performed Overall Cognitive Status: No family/caregiver present to determine baseline cognitive functioning Area of Impairment: Safety/judgement  Safety/Judgement: Decreased awareness of safety;Decreased awareness of deficits     General Comments: Cued pt to identify if he felt unsteady or noticed his LOB, with him denying it. Pt reports feeling safe with a rollator at home and thus was educated on benefits of RW for inc stability.      Exercises       General Comments General comments (skin integrity, edema, etc.): BP sitting in chair at start of session 110/80; BP upon standing 145/97      Pertinent Vitals/Pain Pain Assessment: No/denies pain    Home Living     Available Help at Discharge: Family;Available PRN/intermittently Type of Home: House              Prior Function            PT Goals (current goals can now be found in the care plan section) Acute Rehab PT Goals Patient Stated Goal: go home PT Goal Formulation: With patient Time For Goal Achievement: 12/06/19 Potential to Achieve Goals: Fair Progress towards PT goals: Progressing toward goals    Frequency    Min 4X/week      PT Plan Current plan remains appropriate    Co-evaluation              AM-PAC PT "6 Clicks" Mobility   Outcome Measure  Help needed turning from your back to your side while in a flat bed without using bedrails?: A Little Help needed moving from lying on your back to sitting on the side of a flat bed without using bedrails?: A Little Help needed moving to and from a bed to a chair (including a wheelchair)?: A Little Help needed standing up from a chair using your arms (e.g., wheelchair or bedside chair)?: A Little Help needed to walk in hospital room?: A Little Help needed climbing 3-5 steps with a railing? : A Lot 6 Click Score: 17    End of Session Equipment Utilized During Treatment: Gait belt Activity Tolerance: Patient limited by fatigue Patient left: in chair;with call bell/phone within reach;with chair alarm set Nurse Communication: Other (comment) (BP levels during session) PT Visit Diagnosis: Unsteadiness on feet (R26.81);Other abnormalities of gait and mobility (R26.89);History of falling (Z91.81);Difficulty in walking, not elsewhere classified (R26.2);Hemiplegia and hemiparesis Hemiplegia - Right/Left: Right Hemiplegia - dominant/non-dominant: Dominant Hemiplegia - caused by: Cerebral infarction      Time: 8366-2947 PT Time Calculation (min) (ACUTE ONLY): 19 min  Charges:  $Gait Training: 8-22 mins                     Raymond Gurney, PT, DPT Acute Rehabilitation Services  Pager: [phone] Office: [phone]    Jewel Baize 11/23/2019, 12:38 PM

## 2019-11-23 NOTE — Evaluation (Signed)
Speech Language Pathology Evaluation Patient Details Name: James [name] MRN: [id number] DOB: 07-Apr-1945 Today's Date: 11/23/2019 Time: 7353-2992 SLP Time Calculation (min) (ACUTE ONLY): 43 min  Problem List:  Patient Active Problem List   Diagnosis Date Noted  . Acute CVA (cerebrovascular accident) (HCC) 11/21/2019  . Acute CHF (congestive heart failure) (HCC) 09/09/2019  . CHF (congestive heart failure) (HCC) 09/09/2019  . Cryptogenic stroke (HCC) 04/27/2019  . Left sided numbness 02/06/2019  . Acute lacunar infarction (HCC) 02/06/2019  . Obesity, Class II, BMI 35-39.9 02/06/2019  . Hyperlipidemia associated with type 2 diabetes mellitus (HCC)   . Ischemic cardiomyopathy 10/12/2017  . Mild pulmonary hypertension (HCC) 10/12/2017  . S/P CABG (coronary artery bypass graft) 10/01/2017  . Status post coronary artery stent placement   . Left ventricular systolic dysfunction 09/30/2017  . Diverticulitis of sigmoid colon 07/24/2017  . Chronic systolic CHF (congestive heart failure) (HCC) 07/24/2017  . CKD (chronic kidney disease), stage III (HCC) 07/24/2017  . Pleural thickening 07/24/2017  . Aortic atherosclerosis (HCC) 07/07/2017  . Abnormal nuclear cardiac imaging test 11/14/2013  . Abnormal EKG 03/09/2013  . Dilated aortic root (HCC)   . Edema 02/09/2013  . Hypertension associated with diabetes (HCC) 10/24/2011  . Coronary artery disease 10/24/2011  . Hyperlipidemia 10/24/2011  . DM (diabetes mellitus), type 2, uncontrolled with complications (HCC) 10/24/2011  . GERD (gastroesophageal reflux disease) 10/24/2011   Past Medical History:  Past Medical History:  Diagnosis Date  . Abnormal EKG 03/09/2013  . Abnormal nuclear cardiac imaging test 11/14/2013  . Acute lacunar infarction (HCC) 02/06/2019  . Aortic atherosclerosis (HCC) 07/07/2017  . Arthritis    "all over" (09/30/2017)  . Ascending aortic aneurysm (HCC)    cMRI (2/15): Normal LV EF 54%, Mild BAE, Trileaflet Aortic  Valve, Upper limits of normal ascending aorta 3.8 cm, Normal aortic arch 2.3 cm with bovine origin of left carotid  . Atrial flutter (HCC)    Post-op with no reoccurence  . Chronic diastolic heart failure (HCC)    Echo (1/15): Left ventricle: The cavity size was mildly dilated. Mild LVH. EF 55% to 60%. Wall motion was normal; Gr 1 diastolic dysfunction Left atrium: The atrium was mildly dilated. Right atrium: The atrium was mildly to moderately dilated. Aortic arch measures 4.8 cm (moderately dilated); suggest CTA or MRA to better assess.  . Chronic systolic CHF (congestive heart failure) (HCC) 07/24/2017  . CKD (chronic kidney disease), stage III (HCC) 07/24/2017  . Coronary artery disease 10/2011   a. s/p cabg;  b. Myoview (9/15):  ant ischemia, poss TID, EF 43%, high risk >> LHC (9/15): Dist LM 50, pLAD 90 then 100, pCFX 50, mCFX 75, oRCA 75, dRCA 95, L-LAD patent, S-Dx 100, S-OM1 patent, S-PDA patent, EF 55% >> native Dx small caliber and not well suited for PCI; native RCA amenable to PCI and would restore flow to PLA branches but would jeopardize SVG-RCA - Med Rx  . Deviated nasal septum   . Dilated aortic root (HCC)   . Diverticulitis of sigmoid colon 07/24/2017  . DM (diabetes mellitus), type 2, uncontrolled with complications (HCC)    onset 2013  . Dyslipidemia   . Edema 02/09/2013  . GERD (gastroesophageal reflux disease)   . Hx of Doppler ultrasound    Carotid US (9/13): No ICA stenosis  . Hypertension   . Pleural thickening 07/24/2017   Past Surgical History:  Past Surgical History:  Procedure Laterality Date  . CORONARY ARTERY BYPASS GRAFT  10/27/2011  Procedure: CORONARY ARTERY BYPASS GRAFTING (CABG);  Surgeon: Kerin Perna, MD;  Location: [facility] OR;  Service: Open Heart Surgery;  Laterality: N/A;  Coronary Artery Bypass Grafting times four using left internal mammary artery and right greater saphenous vein endoscopically harvested  . CORONARY STENT INTERVENTION N/A 09/30/2017    Procedure: CORONARY STENT INTERVENTION;  Surgeon: Corky Crafts, MD;  Location: [facility] INVASIVE CV LAB;  Service: Cardiovascular;  Laterality: N/A;  . KNEE ARTHROSCOPY Left 2003  . KNEE ARTHROSCOPY Right 2006  . LEFT HEART CATHETERIZATION WITH CORONARY ANGIOGRAM N/A 10/23/2011   Procedure: LEFT HEART CATHETERIZATION WITH CORONARY ANGIOGRAM;  Surgeon: Quintella Reichert, MD;  Location: [facility] CATH LAB;  Service: Cardiovascular;  Laterality: N/A;  . LEFT HEART CATHETERIZATION WITH CORONARY ANGIOGRAM N/A 11/16/2013   Procedure: LEFT HEART CATHETERIZATION WITH CORONARY ANGIOGRAM;  Surgeon: Micheline Chapman, MD;  Location: [facility] CATH LAB;  Service: Cardiovascular;  Laterality: N/A;  . RIGHT/LEFT HEART CATH AND CORONARY/GRAFT ANGIOGRAPHY N/A 09/30/2017   Procedure: RIGHT/LEFT HEART CATH AND CORONARY/GRAFT ANGIOGRAPHY;  Surgeon: Corky Crafts, MD;  Location: [facility] INVASIVE CV LAB;  Service: Cardiovascular;  Laterality: N/A;   HPI:  James [name] is a 74 yo male adm to [facility] with RUE weakness due to acute CVA.  Imaging showed acute infarction left lateral thalamus/posterior limb/internal capsule. Old small vessel ischemic changes affect the pons.Old small vessel cerebellar, bilateral thalami, bilateral basal ganglia t/o deep white matter.   Speech evaluation ordered. Pt admits to some speech deficits that he states improved in the last day.  He also had some dysarthria following prior stroke that improved.  Pt denies memory, cognitive, language changes with this event.   Assessment / Plan / Recommendation Clinical Impression  SLUMS testing completed with pt scoring 20/30 - areas of difficulties included memory of 5 items - retrieval deficit noted but with category cue pt able to recall. His speech is mildly dysarthric but intelligible - appears more ataxic.  SLP advised him to turn off background noise, etc when communicating with people to help with their understanding.  His language was adequate for higher level  conversation- fully fluent.   Given memory portion scoring, advised pt to have his daughter check to assure he is paying bills, taking medications, appts, etc as needed.  Pt agreeable and states he is going to pay someone to handle his finances.  Also advised pt record himself on his phone over the next few days - repeating the same phrases, etc over several days, months, etc to monitor for improvement in articulation abilties.  No SLP follow up needed as pt will have support level needed at home.  Thanks for this consult.    SLP Assessment  SLP Recommendation/Assessment: Patient does not need any further Speech Lanaguage Pathology Services SLP Visit Diagnosis: Dysarthria and anarthria (R47.1)    Follow Up Recommendations  None    Frequency and Duration           SLP Evaluation Cognition  Overall Cognitive Status: No family/caregiver present to determine baseline cognitive functioning Arousal/Alertness: Awake/alert Orientation Level: Oriented to person;Oriented to place;Oriented to time;Oriented to situation Attention: Selective (pt's chair alarming beeping entire session) Selective Attention: Appears intact Memory: Impaired (recalled 2/5 items independently, 3/5 with cues) Memory Impairment: Retrieval deficit Safety/Judgment: Appears intact Comments: pt advises he is going to hire someone to manage his bills, finances, etc       Comprehension  Auditory Comprehension Overall Auditory Comprehension: Appears within functional limits for tasks assessed Yes/No  Questions: Not tested Commands: Within Functional Limits Conversation: Complex Visual Recognition/Discrimination Discrimination: Within Function Limits Reading Comprehension Reading Status: Not tested    Expression Expression Primary Mode of Expression: Verbal Verbal Expression Overall Verbal Expression: Appears within functional limits for tasks assessed Initiation: No impairment Repetition: No impairment Naming: Not  tested Non-Verbal Means of Communication: Not applicable Written Expression Dominant Hand: Right Written Expression:  (mechanics of writing mildly difficult for patient)   Oral / Motor  Oral Motor/Sensory Function Overall Oral Motor/Sensory Function: Mild impairment Lingual Strength: Reduced (discoordination) Motor Speech Overall Motor Speech: Impaired Respiration: Within functional limits Phonation: Normal Resonance: Within functional limits Articulation: Impaired Level of Impairment:  (consonant clusters) Intelligibility: Intelligibility reduced Word: 75-100% accurate Phrase: 75-100% accurate Sentence: 75-100% accurate Conversation: 75-100% accurate Motor Planning: Witnin functional limits Motor Speech Errors: Not applicable Effective Techniques:  (pt is intelligible)   GO                    Chales Abrahams 11/23/2019, 10:50 AM   Rolena Infante, MS [facility] SLP Acute Rehab Services Office [phone]

## 2019-11-23 NOTE — Progress Notes (Signed)
Patient left with family member via a truck. AVS given to patient by pervious nurse on day shift.

## 2019-11-23 NOTE — TOC Transition Note (Addendum)
Transition of Care [facility]) - CM/SW Discharge Note   Patient Details  Name: James [name] MRN: [id number] Date of Birth: Jan 11, 1946  [facility] CM/SW Contact:  Pollie Friar, RN Phone Number: 11/23/2019, 3:19 PM   Clinical Narrative:    Recommendations are for SNF rehab. CM met with the patient and he is adamantly refusing SNF. CM informed him that therapies did not feel he was safe at home alone. Pt is refusing anything but home with [facility] services. CM inquired about contacting his daughter and he gave permission. CM has attempted to reach her but both numbers dont work. CM also left voicemail for pts SIL but no return call. CM inquired about family or friends that could stay with him or he could stay with and he states no one. CM has updated MD that pt refuses SNF.  Home health arranged through [facility].  CM did update the patient that due to our concerns for his safety at home that the hospital will be contacting APS. Pt voiced understanding. CM provided him 30 day free card for Brilinta. Pt is aware of where to pick up the medication.  CM called [facility] APS and they took the patients information, but could not start a case until [facility] sees him in the home. Then APS called back and said he is in [facility] but the same rules apply. CM has updated Tommi Rumps with [facility] Lemmings so they can see him at home and call APS as needed.   Final next level of care: Home w Home Health Services Barriers to Discharge: No Barriers Identified   Patient Goals and CMS Choice   CMS Medicare.gov Compare Post Acute Care list provided to:: Patient Choice offered to / list presented to : Patient  Discharge Placement                       Discharge Plan and Services                          HH Arranged: RN, PT, OT, Nurse's Aide, Social Work [employer] Agency: [place] Date [employer] Agency Contacted: 11/23/19   Representative spoke with at [place]: [place] ([place]) Interventions     Readmission Risk Interventions No flowsheet data found.

## 2019-11-23 NOTE — Evaluation (Signed)
Occupational Therapy Evaluation Patient Details Name: James [name] MRN: [id number] DOB: January 29, 1946 Today's Date: 11/23/2019    History of Present Illness 74 y.o. male with medical history significant for CAD s/p CABG, history of CVA with cryptogenic stroke in December 2020, chronic systolic CHF, CKD stage III, type 2 diabetes, hypertension, hyperlipidemia, and recurrent falls who presents to the ED on 10/4 for evaluation of right-sided weakness. MRI reveals small remote lacunar infarct L BG, L lateral thalamus and posterior limb IC CVA.   Clinical Impression   PTA Pt was living at home alone, driving, and mostly independent with DME (shower chair, Rollator/cane). Pt experiencing falls and recently got life alert. Today his BUE strength and ROM is symmetrical. He was able to perform transfers at min A for balance and cues for safety with hand placement and RW management. He was not able to get socks on without mod A. ("I just wear house shoes, I don't mess with socks") While standing at the sink he has 3 LOB requiring therapist intervention. When asked why he was so against going to SNF he had multiple reasons stating "last time they helped me, but they kept me too long to make money off me" "I got new furniture" and "I just want to go home" He recognizes that he will need assist for medicine management and finance management (to make sure that he does not get taken advantage of. OT will follow acutely and will plan on pill box assessment to maximize safety and independence in ADL and functional transfers. Should James [name] choose to go home he will need HHOT and max HH services to ensure safety. His daughter has 5 children and cannot be with him 24/7.      Follow Up Recommendations  SNF;Supervision/Assistance - 24 hour;Home health OT (Pt states he will declinedue to previous experience)    Equipment Recommendations  None recommended by OT (Pt has appropriate DME at home)    Recommendations for  Other Services Speech consult (cognition)     Precautions / Restrictions Precautions Precautions: Fall Precaution Comments: foley catheter Restrictions Weight Bearing Restrictions: No      Mobility Bed Mobility               General bed mobility comments: OOB in recliner at beginning and end of session  Transfers Overall transfer level: Needs assistance Equipment used: Rolling walker (2 wheeled) Transfers: Sit to/from Stand Sit to Stand: Min assist         General transfer comment: cues for safe hand placement, min A for balance - gait belt used    Balance Overall balance assessment: Needs assistance;History of Falls Sitting-balance support: No upper extremity supported;Feet supported Sitting balance-Leahy Scale: Fair     Standing balance support: Bilateral upper extremity supported Standing balance-Leahy Scale: Poor Standing balance comment: Requires B UE support on RW to maintain static standing balance with min guard.                           ADL either performed or assessed with clinical judgement   ADL Overall ADL's : Needs assistance/impaired Eating/Feeding: Set up Eating/Feeding Details (indicate cue type and reason): Pt had eaten minimal items on tray when OT entered and did not want to eat any more Grooming: Wash/dry face;Oral care;Standing;Minimal assistance Grooming Details (indicate cue type and reason): LOB x3 while standing at sink for grooming, cues for sequencing Upper Body Bathing: Minimal assistance   Lower Body Bathing:  Moderate assistance   Upper Body Dressing : Set up   Lower Body Dressing: Moderate assistance   Toilet Transfer: Minimal assistance;RW     Toileting - Clothing Manipulation Details (indicate cue type and reason): foley cath     Functional mobility during ADLs: Minimal assistance;Rolling walker;Cueing for safety General ADL Comments: Pt with decreased safety awareness, balance     Vision          Perception     Praxis      Pertinent Vitals/Pain Pain Assessment: No/denies pain     Hand Dominance Right   Extremity/Trunk Assessment Upper Extremity Assessment Upper Extremity Assessment: Generalized weakness (skinned elbow Bil due to crawling on gravel from falls)   Lower Extremity Assessment Lower Extremity Assessment: Defer to PT evaluation   Cervical / Trunk Assessment Cervical / Trunk Assessment: Normal   Communication Communication Communication: No difficulties   Cognition Arousal/Alertness: Awake/alert Behavior During Therapy: WFL for tasks assessed/performed Overall Cognitive Status: No family/caregiver present to determine baseline cognitive functioning Area of Impairment: Safety/judgement                         Safety/Judgement: Decreased awareness of safety;Decreased awareness of deficits     General Comments: Pt does not see fall risk of going home " I have a life alert",  He states that he will have someone assist with medicine management and finances (he does not trust his daughter in these matters)   General Comments  BP sitting in chair at start of session 110/80; BP upon standing 145/97    Exercises     Shoulder Instructions      Home Living Family/patient expects to be discharged to:: Private residence Living Arrangements: Alone Available Help at Discharge: Family;Available PRN/intermittently Type of Home: House Home Access: Stairs to enter [employer] of Steps: 4 Entrance Stairs-Rails: Right Home Layout: Two level;Able to live on main level with bedroom/bathroom     Bathroom Shower/Tub: Walk-in shower;Tub/shower unit   Bathroom Toilet: Handicapped height Bathroom Accessibility: Yes How Accessible: Accessible via walker Home Equipment: Grab bars - tub/shower;Shower seat;Cane - single point;Walker - 2 wheels          Prior Functioning/Environment Level of Independence: Independent with assistive device(s)         Comments: pt reports using both cane and walker PTA, reports "a couple" of falls one of which pt laid in the driveway all night. Pt reports getting life alert button since then.        OT Problem List: Decreased activity tolerance;Impaired balance (sitting and/or standing);Decreased safety awareness;Obesity      OT Treatment/Interventions: Self-care/ADL training;DME and/or AE instruction;Therapeutic activities;Cognitive remediation/compensation;Patient/family education;Balance training    OT Goals(Current goals can be found in the care plan section) Acute Rehab OT Goals Patient Stated Goal: go home OT Goal Formulation: With patient Time For Goal Achievement: 12/07/19 Potential to Achieve Goals: Good ADL Goals Pt Will Perform Grooming: with supervision;standing Pt Will Perform Upper Body Dressing: with modified independence;sitting Pt Will Perform Lower Body Dressing: with supervision;sit to/from stand;with adaptive equipment Pt Will Transfer to Toilet: with supervision;ambulating Pt Will Perform Toileting - Clothing Manipulation and hygiene: with modified independence;sitting/lateral leans Additional ADL Goal #1: Pt will perform "pill box" type assessment and pass without cues.  OT Frequency: Min 2X/week   Barriers to D/C: Decreased caregiver support  Pt lives alone, daughter has 5 children (and he does not trust her)       Co-evaluation  AM-PAC OT "6 Clicks" Daily Activity     Outcome Measure Help from another person eating meals?: A Little Help from another person taking care of personal grooming?: A Little Help from another person toileting, which includes using toliet, bedpan, or urinal?: A Little Help from another person bathing (including washing, rinsing, drying)?: A Lot Help from another person to put on and taking off regular upper body clothing?: A Little Help from another person to put on and taking off regular lower body clothing?: A Lot 6  Click Score: 16   End of Session Equipment Utilized During Treatment: Gait belt;Rolling walker Nurse Communication: Mobility status  Activity Tolerance: Patient tolerated treatment well Patient left: in chair;with call bell/phone within reach;with chair alarm set  OT Visit Diagnosis: Unsteadiness on feet (R26.81);Other abnormalities of gait and mobility (R26.89);Repeated falls (R29.6);History of falling (Z91.81);Other symptoms and signs involving cognitive function                Time: 1025-1059 OT Time Calculation (min): 34 min Charges:  OT General Charges $OT Visit: 1 Visit OT Evaluation $OT Eval Moderate Complexity: 1 Mod OT Treatments $Self Care/Home Management : 8-22 mins  Nyoka Cowden OTR/L Acute Rehabilitation Services Pager: [phone] Office: [phone]  Evern Bio Icholas Irby 11/23/2019, 1:34 PM

## 2019-11-23 NOTE — Plan of Care (Signed)

## 2019-11-24 ENCOUNTER — Other Ambulatory Visit: Payer: Self-pay

## 2019-11-24 NOTE — Patient Outreach (Signed)
[facility]) Care Management  11/24/2019  Asencion Loveday [facility] [birth year] [id number]   Referral Date: 11/24/19 Referral Source: Hospital Liaison Referral Reason: New CVA.  Follow up for further needs.  Please outreach daughter patient HOH.   Outreach Attempt: Telephone call to daughter [name] per referral request. Voicemail full unable to leave a message.    Plan: RN CM will attempt again within 4 business days and send letter.   [name], RN, MSN [facility] Care Management Care Management Coordinator Direct Line [phone] Toll Free: [phone]  Fax: [phone]

## 2019-11-25 ENCOUNTER — Other Ambulatory Visit: Payer: Self-pay

## 2019-11-25 DIAGNOSIS — I0981 Rheumatic heart failure: Secondary | ICD-10-CM | POA: Diagnosis not present

## 2019-11-25 DIAGNOSIS — I69351 Hemiplegia and hemiparesis following cerebral infarction affecting right dominant side: Secondary | ICD-10-CM | POA: Diagnosis not present

## 2019-11-25 DIAGNOSIS — R29898 Other symptoms and signs involving the musculoskeletal system: Secondary | ICD-10-CM | POA: Diagnosis not present

## 2019-11-25 DIAGNOSIS — I088 Other rheumatic multiple valve diseases: Secondary | ICD-10-CM | POA: Diagnosis not present

## 2019-11-25 DIAGNOSIS — I5042 Chronic combined systolic (congestive) and diastolic (congestive) heart failure: Secondary | ICD-10-CM | POA: Diagnosis not present

## 2019-11-25 DIAGNOSIS — N183 Chronic kidney disease, stage 3 unspecified: Secondary | ICD-10-CM | POA: Diagnosis not present

## 2019-11-25 DIAGNOSIS — E1122 Type 2 diabetes mellitus with diabetic chronic kidney disease: Secondary | ICD-10-CM | POA: Diagnosis not present

## 2019-11-25 DIAGNOSIS — I130 Hypertensive heart and chronic kidney disease with heart failure and stage 1 through stage 4 chronic kidney disease, or unspecified chronic kidney disease: Secondary | ICD-10-CM | POA: Diagnosis not present

## 2019-11-25 DIAGNOSIS — I69398 Other sequelae of cerebral infarction: Secondary | ICD-10-CM | POA: Diagnosis not present

## 2019-11-25 NOTE — Patient Outreach (Signed)
[facility]) Care Management  11/25/2019  James [name] [facility] 11-02-1945 [id number]   Referral Date: 11/24/19 Referral Source: Hospital Liaison Referral Reason: New CVA.  Follow up for further needs.  Please outreach daughter patient HOH.   Outreach Attempt: Telephone call to daughter James [name] per referral request. Voicemail full unable to leave a message.    Plan: RN CM will attempt again within 4 business days.  [name], RN, MSN [facility] Care Management Care Management Coordinator Direct Line [phone] Cell [phone] Toll Free: [phone]  Fax: [phone]

## 2019-11-28 ENCOUNTER — Other Ambulatory Visit: Payer: Self-pay

## 2019-11-28 NOTE — Patient Outreach (Signed)
[facility]) Care Management  11/28/2019  [name] [facility] 04-14-1945 [id number]   Referral Date:11/24/19 Referral Source:Hospital Liaison Referral Reason:New CVA. Follow up for further needs. Please outreach daughter patient HOH.   Outreach Attempt:Telephone call to daughter [name] per referral request. Voicemail full unable to leave a message.   Plan:RN CM will attempt again in the month of October.   [name], RN, MSN [facility] Care Management Care Management Coordinator Direct Line [phone] Cell [phone] Toll Free: [phone]  Fax: [phone]

## 2019-11-29 DIAGNOSIS — E1122 Type 2 diabetes mellitus with diabetic chronic kidney disease: Secondary | ICD-10-CM | POA: Diagnosis not present

## 2019-11-29 DIAGNOSIS — I69398 Other sequelae of cerebral infarction: Secondary | ICD-10-CM | POA: Diagnosis not present

## 2019-11-29 DIAGNOSIS — I5042 Chronic combined systolic (congestive) and diastolic (congestive) heart failure: Secondary | ICD-10-CM | POA: Diagnosis not present

## 2019-11-29 DIAGNOSIS — I69351 Hemiplegia and hemiparesis following cerebral infarction affecting right dominant side: Secondary | ICD-10-CM | POA: Diagnosis not present

## 2019-11-29 DIAGNOSIS — I088 Other rheumatic multiple valve diseases: Secondary | ICD-10-CM | POA: Diagnosis not present

## 2019-11-29 DIAGNOSIS — I0981 Rheumatic heart failure: Secondary | ICD-10-CM | POA: Diagnosis not present

## 2019-11-29 DIAGNOSIS — I130 Hypertensive heart and chronic kidney disease with heart failure and stage 1 through stage 4 chronic kidney disease, or unspecified chronic kidney disease: Secondary | ICD-10-CM | POA: Diagnosis not present

## 2019-11-29 DIAGNOSIS — N183 Chronic kidney disease, stage 3 unspecified: Secondary | ICD-10-CM | POA: Diagnosis not present

## 2019-11-29 DIAGNOSIS — R29898 Other symptoms and signs involving the musculoskeletal system: Secondary | ICD-10-CM | POA: Diagnosis not present

## 2019-11-30 ENCOUNTER — Other Ambulatory Visit (HOSPITAL_BASED_OUTPATIENT_CLINIC_OR_DEPARTMENT_OTHER): Payer: Self-pay | Admitting: Family Medicine

## 2019-11-30 DIAGNOSIS — E1122 Type 2 diabetes mellitus with diabetic chronic kidney disease: Secondary | ICD-10-CM | POA: Diagnosis not present

## 2019-11-30 DIAGNOSIS — E1121 Type 2 diabetes mellitus with diabetic nephropathy: Secondary | ICD-10-CM | POA: Diagnosis not present

## 2019-11-30 DIAGNOSIS — I130 Hypertensive heart and chronic kidney disease with heart failure and stage 1 through stage 4 chronic kidney disease, or unspecified chronic kidney disease: Secondary | ICD-10-CM | POA: Diagnosis not present

## 2019-11-30 DIAGNOSIS — I69351 Hemiplegia and hemiparesis following cerebral infarction affecting right dominant side: Secondary | ICD-10-CM | POA: Diagnosis not present

## 2019-11-30 DIAGNOSIS — G2581 Restless legs syndrome: Secondary | ICD-10-CM | POA: Diagnosis not present

## 2019-11-30 DIAGNOSIS — Z794 Long term (current) use of insulin: Secondary | ICD-10-CM | POA: Diagnosis not present

## 2019-11-30 DIAGNOSIS — E782 Mixed hyperlipidemia: Secondary | ICD-10-CM | POA: Diagnosis not present

## 2019-11-30 DIAGNOSIS — N183 Chronic kidney disease, stage 3 unspecified: Secondary | ICD-10-CM | POA: Diagnosis not present

## 2019-11-30 DIAGNOSIS — I0981 Rheumatic heart failure: Secondary | ICD-10-CM | POA: Diagnosis not present

## 2019-11-30 DIAGNOSIS — I10 Essential (primary) hypertension: Secondary | ICD-10-CM | POA: Diagnosis not present

## 2019-11-30 DIAGNOSIS — I5042 Chronic combined systolic (congestive) and diastolic (congestive) heart failure: Secondary | ICD-10-CM | POA: Diagnosis not present

## 2019-11-30 DIAGNOSIS — I639 Cerebral infarction, unspecified: Secondary | ICD-10-CM | POA: Diagnosis not present

## 2019-11-30 DIAGNOSIS — R29898 Other symptoms and signs involving the musculoskeletal system: Secondary | ICD-10-CM | POA: Diagnosis not present

## 2019-11-30 DIAGNOSIS — I088 Other rheumatic multiple valve diseases: Secondary | ICD-10-CM | POA: Diagnosis not present

## 2019-11-30 DIAGNOSIS — I69398 Other sequelae of cerebral infarction: Secondary | ICD-10-CM | POA: Diagnosis not present

## 2019-11-30 MED FILL — METOPROLOL SUCCINATE ER 50: 50 | 90 days supply | Qty: 90 | Fill #0

## 2019-11-30 MED FILL — POTASSIUM CL ER 20 MEQ TAB: 20 | 90 days supply | Qty: 90 | Fill #0

## 2019-11-30 MED FILL — ISOSORBIDE MN ER 60 MG TAB: 60 | 90 days supply | Qty: 90 | Fill #0

## 2019-11-30 MED FILL — VITAMIN D3 25 MCG (1000 UT): 25 MCG | 100 days supply | Qty: 100 | Fill #0

## 2019-11-30 MED FILL — TAMSULOSIN HCL 0.4 MG CAP: 0.4 | 90 days supply | Qty: 90 | Fill #0

## 2019-11-30 MED FILL — ATORVASTATIN 80 MG TABLET: 80 | 90 days supply | Qty: 90 | Fill #0

## 2019-11-30 MED FILL — METFORMIN HCL 1000 MG TABS: 1000 | 90 days supply | Qty: 90 | Fill #0

## 2019-11-30 MED FILL — TORSEMIDE 20 MG TABLET: 20 | 90 days supply | Qty: 90 | Fill #0

## 2019-11-30 MED FILL — EZETIMIBE 10 MG TABS: 10 | 90 days supply | Qty: 90 | Fill #0

## 2019-12-01 DIAGNOSIS — I0981 Rheumatic heart failure: Secondary | ICD-10-CM | POA: Diagnosis not present

## 2019-12-01 DIAGNOSIS — I69351 Hemiplegia and hemiparesis following cerebral infarction affecting right dominant side: Secondary | ICD-10-CM | POA: Diagnosis not present

## 2019-12-01 DIAGNOSIS — I130 Hypertensive heart and chronic kidney disease with heart failure and stage 1 through stage 4 chronic kidney disease, or unspecified chronic kidney disease: Secondary | ICD-10-CM | POA: Diagnosis not present

## 2019-12-01 DIAGNOSIS — I5042 Chronic combined systolic (congestive) and diastolic (congestive) heart failure: Secondary | ICD-10-CM | POA: Diagnosis not present

## 2019-12-01 DIAGNOSIS — I088 Other rheumatic multiple valve diseases: Secondary | ICD-10-CM | POA: Diagnosis not present

## 2019-12-01 DIAGNOSIS — I69398 Other sequelae of cerebral infarction: Secondary | ICD-10-CM | POA: Diagnosis not present

## 2019-12-01 DIAGNOSIS — N183 Chronic kidney disease, stage 3 unspecified: Secondary | ICD-10-CM | POA: Diagnosis not present

## 2019-12-01 DIAGNOSIS — E1122 Type 2 diabetes mellitus with diabetic chronic kidney disease: Secondary | ICD-10-CM | POA: Diagnosis not present

## 2019-12-01 DIAGNOSIS — R29898 Other symptoms and signs involving the musculoskeletal system: Secondary | ICD-10-CM | POA: Diagnosis not present

## 2019-12-02 DIAGNOSIS — R29898 Other symptoms and signs involving the musculoskeletal system: Secondary | ICD-10-CM | POA: Diagnosis not present

## 2019-12-02 DIAGNOSIS — I69351 Hemiplegia and hemiparesis following cerebral infarction affecting right dominant side: Secondary | ICD-10-CM | POA: Diagnosis not present

## 2019-12-02 DIAGNOSIS — I69398 Other sequelae of cerebral infarction: Secondary | ICD-10-CM | POA: Diagnosis not present

## 2019-12-02 DIAGNOSIS — I088 Other rheumatic multiple valve diseases: Secondary | ICD-10-CM | POA: Diagnosis not present

## 2019-12-02 DIAGNOSIS — I130 Hypertensive heart and chronic kidney disease with heart failure and stage 1 through stage 4 chronic kidney disease, or unspecified chronic kidney disease: Secondary | ICD-10-CM | POA: Diagnosis not present

## 2019-12-02 DIAGNOSIS — N183 Chronic kidney disease, stage 3 unspecified: Secondary | ICD-10-CM | POA: Diagnosis not present

## 2019-12-02 DIAGNOSIS — I5042 Chronic combined systolic (congestive) and diastolic (congestive) heart failure: Secondary | ICD-10-CM | POA: Diagnosis not present

## 2019-12-02 DIAGNOSIS — I0981 Rheumatic heart failure: Secondary | ICD-10-CM | POA: Diagnosis not present

## 2019-12-02 DIAGNOSIS — E1122 Type 2 diabetes mellitus with diabetic chronic kidney disease: Secondary | ICD-10-CM | POA: Diagnosis not present

## 2019-12-05 DIAGNOSIS — I0981 Rheumatic heart failure: Secondary | ICD-10-CM | POA: Diagnosis not present

## 2019-12-05 DIAGNOSIS — I088 Other rheumatic multiple valve diseases: Secondary | ICD-10-CM | POA: Diagnosis not present

## 2019-12-05 DIAGNOSIS — I5042 Chronic combined systolic (congestive) and diastolic (congestive) heart failure: Secondary | ICD-10-CM | POA: Diagnosis not present

## 2019-12-05 DIAGNOSIS — N183 Chronic kidney disease, stage 3 unspecified: Secondary | ICD-10-CM | POA: Diagnosis not present

## 2019-12-05 DIAGNOSIS — E1122 Type 2 diabetes mellitus with diabetic chronic kidney disease: Secondary | ICD-10-CM | POA: Diagnosis not present

## 2019-12-05 DIAGNOSIS — I130 Hypertensive heart and chronic kidney disease with heart failure and stage 1 through stage 4 chronic kidney disease, or unspecified chronic kidney disease: Secondary | ICD-10-CM | POA: Diagnosis not present

## 2019-12-05 DIAGNOSIS — I69398 Other sequelae of cerebral infarction: Secondary | ICD-10-CM | POA: Diagnosis not present

## 2019-12-05 DIAGNOSIS — R29898 Other symptoms and signs involving the musculoskeletal system: Secondary | ICD-10-CM | POA: Diagnosis not present

## 2019-12-05 DIAGNOSIS — I69351 Hemiplegia and hemiparesis following cerebral infarction affecting right dominant side: Secondary | ICD-10-CM | POA: Diagnosis not present

## 2019-12-06 DIAGNOSIS — I69351 Hemiplegia and hemiparesis following cerebral infarction affecting right dominant side: Secondary | ICD-10-CM | POA: Diagnosis not present

## 2019-12-06 DIAGNOSIS — N183 Chronic kidney disease, stage 3 unspecified: Secondary | ICD-10-CM | POA: Diagnosis not present

## 2019-12-06 DIAGNOSIS — R29898 Other symptoms and signs involving the musculoskeletal system: Secondary | ICD-10-CM | POA: Diagnosis not present

## 2019-12-06 DIAGNOSIS — I5042 Chronic combined systolic (congestive) and diastolic (congestive) heart failure: Secondary | ICD-10-CM | POA: Diagnosis not present

## 2019-12-06 DIAGNOSIS — I0981 Rheumatic heart failure: Secondary | ICD-10-CM | POA: Diagnosis not present

## 2019-12-06 DIAGNOSIS — I69398 Other sequelae of cerebral infarction: Secondary | ICD-10-CM | POA: Diagnosis not present

## 2019-12-06 DIAGNOSIS — I130 Hypertensive heart and chronic kidney disease with heart failure and stage 1 through stage 4 chronic kidney disease, or unspecified chronic kidney disease: Secondary | ICD-10-CM | POA: Diagnosis not present

## 2019-12-06 DIAGNOSIS — E1122 Type 2 diabetes mellitus with diabetic chronic kidney disease: Secondary | ICD-10-CM | POA: Diagnosis not present

## 2019-12-06 DIAGNOSIS — I088 Other rheumatic multiple valve diseases: Secondary | ICD-10-CM | POA: Diagnosis not present

## 2019-12-08 ENCOUNTER — Encounter: Payer: Medicare HMO | Admitting: Cardiology

## 2019-12-08 NOTE — Progress Notes (Signed)
This encounter was created in error - please disregard.

## 2019-12-12 DIAGNOSIS — I0981 Rheumatic heart failure: Secondary | ICD-10-CM | POA: Diagnosis not present

## 2019-12-12 DIAGNOSIS — I130 Hypertensive heart and chronic kidney disease with heart failure and stage 1 through stage 4 chronic kidney disease, or unspecified chronic kidney disease: Secondary | ICD-10-CM | POA: Diagnosis not present

## 2019-12-12 DIAGNOSIS — I088 Other rheumatic multiple valve diseases: Secondary | ICD-10-CM | POA: Diagnosis not present

## 2019-12-12 DIAGNOSIS — N183 Chronic kidney disease, stage 3 unspecified: Secondary | ICD-10-CM | POA: Diagnosis not present

## 2019-12-12 DIAGNOSIS — E1122 Type 2 diabetes mellitus with diabetic chronic kidney disease: Secondary | ICD-10-CM | POA: Diagnosis not present

## 2019-12-12 DIAGNOSIS — I69351 Hemiplegia and hemiparesis following cerebral infarction affecting right dominant side: Secondary | ICD-10-CM | POA: Diagnosis not present

## 2019-12-12 DIAGNOSIS — R29898 Other symptoms and signs involving the musculoskeletal system: Secondary | ICD-10-CM | POA: Diagnosis not present

## 2019-12-12 DIAGNOSIS — I69398 Other sequelae of cerebral infarction: Secondary | ICD-10-CM | POA: Diagnosis not present

## 2019-12-12 DIAGNOSIS — I5042 Chronic combined systolic (congestive) and diastolic (congestive) heart failure: Secondary | ICD-10-CM | POA: Diagnosis not present

## 2019-12-14 DIAGNOSIS — N183 Chronic kidney disease, stage 3 unspecified: Secondary | ICD-10-CM | POA: Diagnosis not present

## 2019-12-14 DIAGNOSIS — I130 Hypertensive heart and chronic kidney disease with heart failure and stage 1 through stage 4 chronic kidney disease, or unspecified chronic kidney disease: Secondary | ICD-10-CM | POA: Diagnosis not present

## 2019-12-14 DIAGNOSIS — E1122 Type 2 diabetes mellitus with diabetic chronic kidney disease: Secondary | ICD-10-CM | POA: Diagnosis not present

## 2019-12-14 DIAGNOSIS — I0981 Rheumatic heart failure: Secondary | ICD-10-CM | POA: Diagnosis not present

## 2019-12-14 DIAGNOSIS — I69398 Other sequelae of cerebral infarction: Secondary | ICD-10-CM | POA: Diagnosis not present

## 2019-12-14 DIAGNOSIS — I5042 Chronic combined systolic (congestive) and diastolic (congestive) heart failure: Secondary | ICD-10-CM | POA: Diagnosis not present

## 2019-12-14 DIAGNOSIS — I69351 Hemiplegia and hemiparesis following cerebral infarction affecting right dominant side: Secondary | ICD-10-CM | POA: Diagnosis not present

## 2019-12-14 DIAGNOSIS — I088 Other rheumatic multiple valve diseases: Secondary | ICD-10-CM | POA: Diagnosis not present

## 2019-12-14 DIAGNOSIS — R29898 Other symptoms and signs involving the musculoskeletal system: Secondary | ICD-10-CM | POA: Diagnosis not present

## 2019-12-15 ENCOUNTER — Other Ambulatory Visit: Payer: Self-pay

## 2019-12-15 NOTE — Patient Outreach (Signed)
[facility]) Care Management  12/15/2019  James [name] [facility] 09/17/1945 [id number]   Referral Date:11/24/19 Referral Source:Hospital Liaison Referral Reason:New CVA. Follow up for further needs. Please outreach daughter patient HOH.   Outreach Attempt:Telephone call to daughter [name] per referral request. Voicemail full unable to leave a message.   Plan:RN CM will close case.   [name], RN, MSN [facility] Care Management Care Management Coordinator Direct Line [phone] Cell [phone] Toll Free: [phone]  Fax: [phone]

## 2019-12-16 DIAGNOSIS — I5022 Chronic systolic (congestive) heart failure: Secondary | ICD-10-CM | POA: Diagnosis not present

## 2019-12-16 DIAGNOSIS — E782 Mixed hyperlipidemia: Secondary | ICD-10-CM | POA: Diagnosis not present

## 2019-12-16 DIAGNOSIS — E7800 Pure hypercholesterolemia, unspecified: Secondary | ICD-10-CM | POA: Diagnosis not present

## 2019-12-16 DIAGNOSIS — E1121 Type 2 diabetes mellitus with diabetic nephropathy: Secondary | ICD-10-CM | POA: Diagnosis not present

## 2019-12-16 DIAGNOSIS — I130 Hypertensive heart and chronic kidney disease with heart failure and stage 1 through stage 4 chronic kidney disease, or unspecified chronic kidney disease: Secondary | ICD-10-CM | POA: Diagnosis not present

## 2019-12-16 DIAGNOSIS — H35033 Hypertensive retinopathy, bilateral: Secondary | ICD-10-CM | POA: Diagnosis not present

## 2019-12-16 DIAGNOSIS — I10 Essential (primary) hypertension: Secondary | ICD-10-CM | POA: Diagnosis not present

## 2019-12-16 DIAGNOSIS — I0981 Rheumatic heart failure: Secondary | ICD-10-CM | POA: Diagnosis not present

## 2019-12-16 DIAGNOSIS — I5032 Chronic diastolic (congestive) heart failure: Secondary | ICD-10-CM | POA: Diagnosis not present

## 2019-12-19 DIAGNOSIS — R29898 Other symptoms and signs involving the musculoskeletal system: Secondary | ICD-10-CM | POA: Diagnosis not present

## 2019-12-19 DIAGNOSIS — I0981 Rheumatic heart failure: Secondary | ICD-10-CM | POA: Diagnosis not present

## 2019-12-19 DIAGNOSIS — I130 Hypertensive heart and chronic kidney disease with heart failure and stage 1 through stage 4 chronic kidney disease, or unspecified chronic kidney disease: Secondary | ICD-10-CM | POA: Diagnosis not present

## 2019-12-19 DIAGNOSIS — I088 Other rheumatic multiple valve diseases: Secondary | ICD-10-CM | POA: Diagnosis not present

## 2019-12-19 DIAGNOSIS — N183 Chronic kidney disease, stage 3 unspecified: Secondary | ICD-10-CM | POA: Diagnosis not present

## 2019-12-19 DIAGNOSIS — I5042 Chronic combined systolic (congestive) and diastolic (congestive) heart failure: Secondary | ICD-10-CM | POA: Diagnosis not present

## 2019-12-19 DIAGNOSIS — I69398 Other sequelae of cerebral infarction: Secondary | ICD-10-CM | POA: Diagnosis not present

## 2019-12-19 DIAGNOSIS — I69351 Hemiplegia and hemiparesis following cerebral infarction affecting right dominant side: Secondary | ICD-10-CM | POA: Diagnosis not present

## 2019-12-19 DIAGNOSIS — E1122 Type 2 diabetes mellitus with diabetic chronic kidney disease: Secondary | ICD-10-CM | POA: Diagnosis not present

## 2019-12-25 DIAGNOSIS — N183 Chronic kidney disease, stage 3 unspecified: Secondary | ICD-10-CM | POA: Diagnosis not present

## 2019-12-25 DIAGNOSIS — E1122 Type 2 diabetes mellitus with diabetic chronic kidney disease: Secondary | ICD-10-CM | POA: Diagnosis not present

## 2019-12-25 DIAGNOSIS — I69351 Hemiplegia and hemiparesis following cerebral infarction affecting right dominant side: Secondary | ICD-10-CM | POA: Diagnosis not present

## 2019-12-25 DIAGNOSIS — I130 Hypertensive heart and chronic kidney disease with heart failure and stage 1 through stage 4 chronic kidney disease, or unspecified chronic kidney disease: Secondary | ICD-10-CM | POA: Diagnosis not present

## 2019-12-25 DIAGNOSIS — R29898 Other symptoms and signs involving the musculoskeletal system: Secondary | ICD-10-CM | POA: Diagnosis not present

## 2019-12-25 DIAGNOSIS — I69398 Other sequelae of cerebral infarction: Secondary | ICD-10-CM | POA: Diagnosis not present

## 2019-12-25 DIAGNOSIS — I0981 Rheumatic heart failure: Secondary | ICD-10-CM | POA: Diagnosis not present

## 2019-12-25 DIAGNOSIS — I5042 Chronic combined systolic (congestive) and diastolic (congestive) heart failure: Secondary | ICD-10-CM | POA: Diagnosis not present

## 2019-12-25 DIAGNOSIS — I088 Other rheumatic multiple valve diseases: Secondary | ICD-10-CM | POA: Diagnosis not present

## 2019-12-26 DIAGNOSIS — I0981 Rheumatic heart failure: Secondary | ICD-10-CM | POA: Diagnosis not present

## 2019-12-26 DIAGNOSIS — I130 Hypertensive heart and chronic kidney disease with heart failure and stage 1 through stage 4 chronic kidney disease, or unspecified chronic kidney disease: Secondary | ICD-10-CM | POA: Diagnosis not present

## 2019-12-26 DIAGNOSIS — I5042 Chronic combined systolic (congestive) and diastolic (congestive) heart failure: Secondary | ICD-10-CM | POA: Diagnosis not present

## 2019-12-26 DIAGNOSIS — E1122 Type 2 diabetes mellitus with diabetic chronic kidney disease: Secondary | ICD-10-CM | POA: Diagnosis not present

## 2019-12-26 DIAGNOSIS — R29898 Other symptoms and signs involving the musculoskeletal system: Secondary | ICD-10-CM | POA: Diagnosis not present

## 2019-12-26 DIAGNOSIS — N183 Chronic kidney disease, stage 3 unspecified: Secondary | ICD-10-CM | POA: Diagnosis not present

## 2019-12-26 DIAGNOSIS — I088 Other rheumatic multiple valve diseases: Secondary | ICD-10-CM | POA: Diagnosis not present

## 2019-12-26 DIAGNOSIS — I69351 Hemiplegia and hemiparesis following cerebral infarction affecting right dominant side: Secondary | ICD-10-CM | POA: Diagnosis not present

## 2019-12-26 DIAGNOSIS — I69398 Other sequelae of cerebral infarction: Secondary | ICD-10-CM | POA: Diagnosis not present

## 2019-12-27 DIAGNOSIS — I0981 Rheumatic heart failure: Secondary | ICD-10-CM | POA: Diagnosis not present

## 2019-12-27 DIAGNOSIS — I130 Hypertensive heart and chronic kidney disease with heart failure and stage 1 through stage 4 chronic kidney disease, or unspecified chronic kidney disease: Secondary | ICD-10-CM | POA: Diagnosis not present

## 2019-12-27 DIAGNOSIS — I69351 Hemiplegia and hemiparesis following cerebral infarction affecting right dominant side: Secondary | ICD-10-CM | POA: Diagnosis not present

## 2019-12-27 DIAGNOSIS — R29898 Other symptoms and signs involving the musculoskeletal system: Secondary | ICD-10-CM | POA: Diagnosis not present

## 2019-12-27 DIAGNOSIS — N183 Chronic kidney disease, stage 3 unspecified: Secondary | ICD-10-CM | POA: Diagnosis not present

## 2019-12-27 DIAGNOSIS — I088 Other rheumatic multiple valve diseases: Secondary | ICD-10-CM | POA: Diagnosis not present

## 2019-12-27 DIAGNOSIS — I5042 Chronic combined systolic (congestive) and diastolic (congestive) heart failure: Secondary | ICD-10-CM | POA: Diagnosis not present

## 2019-12-27 DIAGNOSIS — I69398 Other sequelae of cerebral infarction: Secondary | ICD-10-CM | POA: Diagnosis not present

## 2019-12-27 DIAGNOSIS — E1122 Type 2 diabetes mellitus with diabetic chronic kidney disease: Secondary | ICD-10-CM | POA: Diagnosis not present

## 2020-01-04 MED FILL — NovoLOG 100 UNIT/ML SOLN: 100 | 40 days supply | Qty: 10 | Fill #0

## 2020-01-04 MED FILL — TOUJEO SOLOSTAR 300 UNITS/M: 300 | 40 days supply | Qty: 5 | Fill #0

## 2020-01-05 MED FILL — BD PEN NDL SHORT 31GX5/16: 31G X 8 MM | 50 days supply | Qty: 100 | Fill #0

## 2020-01-09 ENCOUNTER — Other Ambulatory Visit (HOSPITAL_BASED_OUTPATIENT_CLINIC_OR_DEPARTMENT_OTHER): Payer: Self-pay | Admitting: Internal Medicine

## 2020-01-09 DIAGNOSIS — I69351 Hemiplegia and hemiparesis following cerebral infarction affecting right dominant side: Secondary | ICD-10-CM | POA: Diagnosis not present

## 2020-01-09 DIAGNOSIS — I130 Hypertensive heart and chronic kidney disease with heart failure and stage 1 through stage 4 chronic kidney disease, or unspecified chronic kidney disease: Secondary | ICD-10-CM | POA: Diagnosis not present

## 2020-01-09 DIAGNOSIS — I69398 Other sequelae of cerebral infarction: Secondary | ICD-10-CM | POA: Diagnosis not present

## 2020-01-09 DIAGNOSIS — R29898 Other symptoms and signs involving the musculoskeletal system: Secondary | ICD-10-CM | POA: Diagnosis not present

## 2020-01-09 DIAGNOSIS — I0981 Rheumatic heart failure: Secondary | ICD-10-CM | POA: Diagnosis not present

## 2020-01-09 DIAGNOSIS — I088 Other rheumatic multiple valve diseases: Secondary | ICD-10-CM | POA: Diagnosis not present

## 2020-01-09 DIAGNOSIS — E1122 Type 2 diabetes mellitus with diabetic chronic kidney disease: Secondary | ICD-10-CM | POA: Diagnosis not present

## 2020-01-09 DIAGNOSIS — N183 Chronic kidney disease, stage 3 unspecified: Secondary | ICD-10-CM | POA: Diagnosis not present

## 2020-01-09 DIAGNOSIS — I5042 Chronic combined systolic (congestive) and diastolic (congestive) heart failure: Secondary | ICD-10-CM | POA: Diagnosis not present

## 2020-01-10 MED FILL — NOVOLOG FLEXPEN SYRINGE: 100 | 30 days supply | Qty: 15 | Fill #0

## 2020-01-17 DIAGNOSIS — I25118 Atherosclerotic heart disease of native coronary artery with other forms of angina pectoris: Secondary | ICD-10-CM | POA: Diagnosis not present

## 2020-01-17 DIAGNOSIS — E1121 Type 2 diabetes mellitus with diabetic nephropathy: Secondary | ICD-10-CM | POA: Diagnosis not present

## 2020-01-17 DIAGNOSIS — I130 Hypertensive heart and chronic kidney disease with heart failure and stage 1 through stage 4 chronic kidney disease, or unspecified chronic kidney disease: Secondary | ICD-10-CM | POA: Diagnosis not present

## 2020-01-17 DIAGNOSIS — H35033 Hypertensive retinopathy, bilateral: Secondary | ICD-10-CM | POA: Diagnosis not present

## 2020-01-17 DIAGNOSIS — I25119 Atherosclerotic heart disease of native coronary artery with unspecified angina pectoris: Secondary | ICD-10-CM | POA: Diagnosis not present

## 2020-01-17 DIAGNOSIS — E782 Mixed hyperlipidemia: Secondary | ICD-10-CM | POA: Diagnosis not present

## 2020-01-17 DIAGNOSIS — I10 Essential (primary) hypertension: Secondary | ICD-10-CM | POA: Diagnosis not present

## 2020-01-17 DIAGNOSIS — N183 Chronic kidney disease, stage 3 unspecified: Secondary | ICD-10-CM | POA: Diagnosis not present

## 2020-01-17 DIAGNOSIS — I5032 Chronic diastolic (congestive) heart failure: Secondary | ICD-10-CM | POA: Diagnosis not present

## 2020-01-18 DIAGNOSIS — I69398 Other sequelae of cerebral infarction: Secondary | ICD-10-CM | POA: Diagnosis not present

## 2020-01-18 DIAGNOSIS — N183 Chronic kidney disease, stage 3 unspecified: Secondary | ICD-10-CM | POA: Diagnosis not present

## 2020-01-18 DIAGNOSIS — I130 Hypertensive heart and chronic kidney disease with heart failure and stage 1 through stage 4 chronic kidney disease, or unspecified chronic kidney disease: Secondary | ICD-10-CM | POA: Diagnosis not present

## 2020-01-18 DIAGNOSIS — R29898 Other symptoms and signs involving the musculoskeletal system: Secondary | ICD-10-CM | POA: Diagnosis not present

## 2020-01-18 DIAGNOSIS — I69351 Hemiplegia and hemiparesis following cerebral infarction affecting right dominant side: Secondary | ICD-10-CM | POA: Diagnosis not present

## 2020-01-18 DIAGNOSIS — I0981 Rheumatic heart failure: Secondary | ICD-10-CM | POA: Diagnosis not present

## 2020-01-18 DIAGNOSIS — E1122 Type 2 diabetes mellitus with diabetic chronic kidney disease: Secondary | ICD-10-CM | POA: Diagnosis not present

## 2020-01-18 DIAGNOSIS — I088 Other rheumatic multiple valve diseases: Secondary | ICD-10-CM | POA: Diagnosis not present

## 2020-01-18 DIAGNOSIS — I5042 Chronic combined systolic (congestive) and diastolic (congestive) heart failure: Secondary | ICD-10-CM | POA: Diagnosis not present

## 2020-01-27 ENCOUNTER — Ambulatory Visit (INDEPENDENT_AMBULATORY_CARE_PROVIDER_SITE_OTHER): Payer: Medicare HMO

## 2020-01-27 DIAGNOSIS — I639 Cerebral infarction, unspecified: Secondary | ICD-10-CM | POA: Diagnosis not present

## 2020-01-27 LAB — CUP PACEART REMOTE DEVICE CHECK
Date Time Interrogation Session: 20211210003124
Implantable Pulse Generator Implant Date: 20210310

## 2020-02-07 MED FILL — PRAMIPEXOLE 0.75 MG TABLET: 0.75 | 90 days supply | Qty: 180 | Fill #0

## 2020-02-08 NOTE — Progress Notes (Signed)
Carelink Summary Report / Loop Recorder 

## 2020-02-09 MED FILL — NOVOLOG FLEXPEN SYRINGE: 100 | 30 days supply | Qty: 15 | Fill #0

## 2020-02-15 DIAGNOSIS — E1122 Type 2 diabetes mellitus with diabetic chronic kidney disease: Secondary | ICD-10-CM | POA: Diagnosis not present

## 2020-02-15 DIAGNOSIS — I25118 Atherosclerotic heart disease of native coronary artery with other forms of angina pectoris: Secondary | ICD-10-CM | POA: Diagnosis not present

## 2020-02-15 DIAGNOSIS — N183 Chronic kidney disease, stage 3 unspecified: Secondary | ICD-10-CM | POA: Diagnosis not present

## 2020-02-15 DIAGNOSIS — E782 Mixed hyperlipidemia: Secondary | ICD-10-CM | POA: Diagnosis not present

## 2020-02-15 DIAGNOSIS — E1121 Type 2 diabetes mellitus with diabetic nephropathy: Secondary | ICD-10-CM | POA: Diagnosis not present

## 2020-02-15 DIAGNOSIS — I639 Cerebral infarction, unspecified: Secondary | ICD-10-CM | POA: Diagnosis not present

## 2020-02-15 DIAGNOSIS — I25119 Atherosclerotic heart disease of native coronary artery with unspecified angina pectoris: Secondary | ICD-10-CM | POA: Diagnosis not present

## 2020-02-15 DIAGNOSIS — I10 Essential (primary) hypertension: Secondary | ICD-10-CM | POA: Diagnosis not present

## 2020-02-15 DIAGNOSIS — I130 Hypertensive heart and chronic kidney disease with heart failure and stage 1 through stage 4 chronic kidney disease, or unspecified chronic kidney disease: Secondary | ICD-10-CM | POA: Diagnosis not present

## 2020-02-24 MED FILL — SM ALCOHOL 70% PREP PADS: 70 | 90 days supply | Qty: 100 | Fill #0

## 2020-02-24 MED FILL — BD PEN NDL SHORT 31GX5/16: 31G X 8 MM | 50 days supply | Qty: 100 | Fill #1

## 2020-02-27 ENCOUNTER — Ambulatory Visit (INDEPENDENT_AMBULATORY_CARE_PROVIDER_SITE_OTHER): Payer: Medicare HMO

## 2020-02-27 DIAGNOSIS — I639 Cerebral infarction, unspecified: Secondary | ICD-10-CM

## 2020-02-29 LAB — CUP PACEART REMOTE DEVICE CHECK
Date Time Interrogation Session: 20220111230153
Implantable Pulse Generator Implant Date: 20210310

## 2020-03-01 ENCOUNTER — Other Ambulatory Visit (HOSPITAL_BASED_OUTPATIENT_CLINIC_OR_DEPARTMENT_OTHER): Payer: Self-pay | Admitting: Family Medicine

## 2020-03-01 DIAGNOSIS — N183 Chronic kidney disease, stage 3 unspecified: Secondary | ICD-10-CM | POA: Diagnosis not present

## 2020-03-01 DIAGNOSIS — Z7984 Long term (current) use of oral hypoglycemic drugs: Secondary | ICD-10-CM | POA: Diagnosis not present

## 2020-03-01 DIAGNOSIS — E1121 Type 2 diabetes mellitus with diabetic nephropathy: Secondary | ICD-10-CM | POA: Diagnosis not present

## 2020-03-01 DIAGNOSIS — G2581 Restless legs syndrome: Secondary | ICD-10-CM | POA: Diagnosis not present

## 2020-03-01 DIAGNOSIS — E782 Mixed hyperlipidemia: Secondary | ICD-10-CM | POA: Diagnosis not present

## 2020-03-01 DIAGNOSIS — I25118 Atherosclerotic heart disease of native coronary artery with other forms of angina pectoris: Secondary | ICD-10-CM | POA: Diagnosis not present

## 2020-03-01 DIAGNOSIS — I10 Essential (primary) hypertension: Secondary | ICD-10-CM | POA: Diagnosis not present

## 2020-03-02 MED FILL — metFORMIN HCL ER 500 MG TB2: 500 | 90 days supply | Qty: 180 | Fill #0

## 2020-03-10 DIAGNOSIS — I130 Hypertensive heart and chronic kidney disease with heart failure and stage 1 through stage 4 chronic kidney disease, or unspecified chronic kidney disease: Secondary | ICD-10-CM | POA: Diagnosis not present

## 2020-03-10 DIAGNOSIS — E1122 Type 2 diabetes mellitus with diabetic chronic kidney disease: Secondary | ICD-10-CM | POA: Diagnosis not present

## 2020-03-10 DIAGNOSIS — E1121 Type 2 diabetes mellitus with diabetic nephropathy: Secondary | ICD-10-CM | POA: Diagnosis not present

## 2020-03-10 DIAGNOSIS — I639 Cerebral infarction, unspecified: Secondary | ICD-10-CM | POA: Diagnosis not present

## 2020-03-10 DIAGNOSIS — I10 Essential (primary) hypertension: Secondary | ICD-10-CM | POA: Diagnosis not present

## 2020-03-10 DIAGNOSIS — N183 Chronic kidney disease, stage 3 unspecified: Secondary | ICD-10-CM | POA: Diagnosis not present

## 2020-03-10 DIAGNOSIS — E782 Mixed hyperlipidemia: Secondary | ICD-10-CM | POA: Diagnosis not present

## 2020-03-10 DIAGNOSIS — I5042 Chronic combined systolic (congestive) and diastolic (congestive) heart failure: Secondary | ICD-10-CM | POA: Diagnosis not present

## 2020-03-10 DIAGNOSIS — I25119 Atherosclerotic heart disease of native coronary artery with unspecified angina pectoris: Secondary | ICD-10-CM | POA: Diagnosis not present

## 2020-03-11 NOTE — Progress Notes (Signed)
Carelink Summary Report / Loop Recorder 

## 2020-03-12 MED FILL — TOUJEO SOLOSTAR 300 UNITS/M: 300 | 40 days supply | Qty: 5 | Fill #1

## 2020-03-13 ENCOUNTER — Other Ambulatory Visit (HOSPITAL_BASED_OUTPATIENT_CLINIC_OR_DEPARTMENT_OTHER): Payer: Self-pay | Admitting: Internal Medicine

## 2020-03-13 DIAGNOSIS — I25810 Atherosclerosis of coronary artery bypass graft(s) without angina pectoris: Secondary | ICD-10-CM | POA: Diagnosis not present

## 2020-03-13 DIAGNOSIS — E119 Type 2 diabetes mellitus without complications: Secondary | ICD-10-CM | POA: Diagnosis not present

## 2020-03-13 DIAGNOSIS — E1165 Type 2 diabetes mellitus with hyperglycemia: Secondary | ICD-10-CM | POA: Diagnosis not present

## 2020-03-13 DIAGNOSIS — E11319 Type 2 diabetes mellitus with unspecified diabetic retinopathy without macular edema: Secondary | ICD-10-CM | POA: Diagnosis not present

## 2020-03-13 MED FILL — OZEMPIC 0.25 OR 0.5 MG/DOSE: 2 | 30 days supply | Qty: 2 | Fill #0

## 2020-03-29 ENCOUNTER — Ambulatory Visit (INDEPENDENT_AMBULATORY_CARE_PROVIDER_SITE_OTHER): Payer: Medicare HMO

## 2020-03-29 DIAGNOSIS — I639 Cerebral infarction, unspecified: Secondary | ICD-10-CM | POA: Diagnosis not present

## 2020-03-30 MED FILL — ATORVASTATIN CALCIUM 80 MG: 80 | 90 days supply | Qty: 90 | Fill #1

## 2020-03-30 MED FILL — LISINOPRIL 20 MG TABS: 20 | 90 days supply | Qty: 90 | Fill #2

## 2020-03-30 MED FILL — POTASSIUM CL ER 20 MEQ TAB: 20 | 90 days supply | Qty: 90 | Fill #1

## 2020-04-02 LAB — CUP PACEART REMOTE DEVICE CHECK
Date Time Interrogation Session: 20220213230523
Implantable Pulse Generator Implant Date: 20210310

## 2020-04-04 NOTE — Progress Notes (Signed)
Carelink Summary Report / Loop Recorder 

## 2020-04-10 DIAGNOSIS — N183 Chronic kidney disease, stage 3 unspecified: Secondary | ICD-10-CM | POA: Diagnosis not present

## 2020-04-10 DIAGNOSIS — I5042 Chronic combined systolic (congestive) and diastolic (congestive) heart failure: Secondary | ICD-10-CM | POA: Diagnosis not present

## 2020-04-10 DIAGNOSIS — I639 Cerebral infarction, unspecified: Secondary | ICD-10-CM | POA: Diagnosis not present

## 2020-04-10 DIAGNOSIS — E782 Mixed hyperlipidemia: Secondary | ICD-10-CM | POA: Diagnosis not present

## 2020-04-10 DIAGNOSIS — I25118 Atherosclerotic heart disease of native coronary artery with other forms of angina pectoris: Secondary | ICD-10-CM | POA: Diagnosis not present

## 2020-04-10 DIAGNOSIS — I0981 Rheumatic heart failure: Secondary | ICD-10-CM | POA: Diagnosis not present

## 2020-04-10 DIAGNOSIS — E1121 Type 2 diabetes mellitus with diabetic nephropathy: Secondary | ICD-10-CM | POA: Diagnosis not present

## 2020-04-10 DIAGNOSIS — I10 Essential (primary) hypertension: Secondary | ICD-10-CM | POA: Diagnosis not present

## 2020-04-10 DIAGNOSIS — I130 Hypertensive heart and chronic kidney disease with heart failure and stage 1 through stage 4 chronic kidney disease, or unspecified chronic kidney disease: Secondary | ICD-10-CM | POA: Diagnosis not present

## 2020-04-18 MED FILL — ULTICARE PEN NDL 8MM 31G: 31G X 8 MM | 50 days supply | Qty: 100 | Fill #2

## 2020-04-30 ENCOUNTER — Ambulatory Visit (INDEPENDENT_AMBULATORY_CARE_PROVIDER_SITE_OTHER): Payer: Medicare HMO

## 2020-04-30 DIAGNOSIS — I639 Cerebral infarction, unspecified: Secondary | ICD-10-CM | POA: Diagnosis not present

## 2020-05-07 LAB — CUP PACEART REMOTE DEVICE CHECK
Date Time Interrogation Session: 20220321081337
Implantable Pulse Generator Implant Date: 20210310

## 2020-05-09 NOTE — Progress Notes (Signed)
Carelink Summary Report / Loop Recorder 

## 2020-05-16 DIAGNOSIS — I5022 Chronic systolic (congestive) heart failure: Secondary | ICD-10-CM | POA: Diagnosis not present

## 2020-05-16 DIAGNOSIS — I25118 Atherosclerotic heart disease of native coronary artery with other forms of angina pectoris: Secondary | ICD-10-CM | POA: Diagnosis not present

## 2020-05-16 DIAGNOSIS — H35033 Hypertensive retinopathy, bilateral: Secondary | ICD-10-CM | POA: Diagnosis not present

## 2020-05-16 DIAGNOSIS — I25119 Atherosclerotic heart disease of native coronary artery with unspecified angina pectoris: Secondary | ICD-10-CM | POA: Diagnosis not present

## 2020-05-16 DIAGNOSIS — N183 Chronic kidney disease, stage 3 unspecified: Secondary | ICD-10-CM | POA: Diagnosis not present

## 2020-05-16 DIAGNOSIS — I130 Hypertensive heart and chronic kidney disease with heart failure and stage 1 through stage 4 chronic kidney disease, or unspecified chronic kidney disease: Secondary | ICD-10-CM | POA: Diagnosis not present

## 2020-05-16 DIAGNOSIS — I10 Essential (primary) hypertension: Secondary | ICD-10-CM | POA: Diagnosis not present

## 2020-05-16 DIAGNOSIS — E1121 Type 2 diabetes mellitus with diabetic nephropathy: Secondary | ICD-10-CM | POA: Diagnosis not present

## 2020-05-16 DIAGNOSIS — E782 Mixed hyperlipidemia: Secondary | ICD-10-CM | POA: Diagnosis not present

## 2020-05-31 LAB — CUP PACEART REMOTE DEVICE CHECK
Date Time Interrogation Session: 20220412230611
Implantable Pulse Generator Implant Date: 20210310

## 2020-06-08 ENCOUNTER — Other Ambulatory Visit (HOSPITAL_BASED_OUTPATIENT_CLINIC_OR_DEPARTMENT_OTHER): Payer: Self-pay

## 2020-06-08 ENCOUNTER — Other Ambulatory Visit (HOSPITAL_COMMUNITY): Payer: Self-pay

## 2020-06-08 MED ORDER — INSULIN PEN NEEDLE 31G X 8 MM MISC
4 refills | Status: AC
  Filled 2020-06-08: qty 100, 50d supply, fill #0

## 2020-06-15 DIAGNOSIS — E782 Mixed hyperlipidemia: Secondary | ICD-10-CM | POA: Diagnosis not present

## 2020-06-15 DIAGNOSIS — H35033 Hypertensive retinopathy, bilateral: Secondary | ICD-10-CM | POA: Diagnosis not present

## 2020-06-15 DIAGNOSIS — N183 Chronic kidney disease, stage 3 unspecified: Secondary | ICD-10-CM | POA: Diagnosis not present

## 2020-06-15 DIAGNOSIS — E7800 Pure hypercholesterolemia, unspecified: Secondary | ICD-10-CM | POA: Diagnosis not present

## 2020-06-15 DIAGNOSIS — E1121 Type 2 diabetes mellitus with diabetic nephropathy: Secondary | ICD-10-CM | POA: Diagnosis not present

## 2020-06-15 DIAGNOSIS — I10 Essential (primary) hypertension: Secondary | ICD-10-CM | POA: Diagnosis not present

## 2020-06-15 DIAGNOSIS — I639 Cerebral infarction, unspecified: Secondary | ICD-10-CM | POA: Diagnosis not present

## 2020-06-15 DIAGNOSIS — I130 Hypertensive heart and chronic kidney disease with heart failure and stage 1 through stage 4 chronic kidney disease, or unspecified chronic kidney disease: Secondary | ICD-10-CM | POA: Diagnosis not present

## 2020-06-15 DIAGNOSIS — I25118 Atherosclerotic heart disease of native coronary artery with other forms of angina pectoris: Secondary | ICD-10-CM | POA: Diagnosis not present

## 2020-07-10 ENCOUNTER — Telehealth: Payer: Self-pay | Admitting: Emergency Medicine

## 2020-07-10 NOTE — Telephone Encounter (Signed)
Carelink alert received for 1 pause event that lasted 5 seconds on 06/23/20. Transmission reviewed with Dr. Ladona Ridgel for multiple alerts for noise question A-fib. Dr. Ladona Ridgel agrees that transmission shows noise.  Contacting patient to assess. Patient does not recall feeling bad during the time of the pause episode and is not sure if he was sleeping or not. Patient states that he has not been working with any heavy equipment or knew of anything that would interfere with his device. I advised patient that the Device clinic would continue to monitor for further alerts.

## 2020-08-02 ENCOUNTER — Ambulatory Visit (INDEPENDENT_AMBULATORY_CARE_PROVIDER_SITE_OTHER): Payer: Medicare HMO

## 2020-08-02 DIAGNOSIS — I491 Atrial premature depolarization: Secondary | ICD-10-CM | POA: Diagnosis not present

## 2020-08-02 DIAGNOSIS — R609 Edema, unspecified: Secondary | ICD-10-CM | POA: Diagnosis not present

## 2020-08-02 DIAGNOSIS — W19XXXA Unspecified fall, initial encounter: Secondary | ICD-10-CM | POA: Diagnosis not present

## 2020-08-02 DIAGNOSIS — I639 Cerebral infarction, unspecified: Secondary | ICD-10-CM | POA: Diagnosis not present

## 2020-08-02 DIAGNOSIS — I10 Essential (primary) hypertension: Secondary | ICD-10-CM | POA: Diagnosis not present

## 2020-08-02 DIAGNOSIS — R000 Tachycardia, unspecified: Secondary | ICD-10-CM | POA: Diagnosis not present

## 2020-08-05 LAB — CUP PACEART REMOTE DEVICE CHECK
Date Time Interrogation Session: 20220619025709
Implantable Pulse Generator Implant Date: 20210310

## 2020-08-07 DIAGNOSIS — E1121 Type 2 diabetes mellitus with diabetic nephropathy: Secondary | ICD-10-CM | POA: Diagnosis not present

## 2020-08-07 DIAGNOSIS — E782 Mixed hyperlipidemia: Secondary | ICD-10-CM | POA: Diagnosis not present

## 2020-08-07 DIAGNOSIS — I130 Hypertensive heart and chronic kidney disease with heart failure and stage 1 through stage 4 chronic kidney disease, or unspecified chronic kidney disease: Secondary | ICD-10-CM | POA: Diagnosis not present

## 2020-08-07 DIAGNOSIS — H35033 Hypertensive retinopathy, bilateral: Secondary | ICD-10-CM | POA: Diagnosis not present

## 2020-08-07 DIAGNOSIS — I10 Essential (primary) hypertension: Secondary | ICD-10-CM | POA: Diagnosis not present

## 2020-08-07 DIAGNOSIS — I639 Cerebral infarction, unspecified: Secondary | ICD-10-CM | POA: Diagnosis not present

## 2020-08-07 DIAGNOSIS — I0981 Rheumatic heart failure: Secondary | ICD-10-CM | POA: Diagnosis not present

## 2020-08-07 DIAGNOSIS — N183 Chronic kidney disease, stage 3 unspecified: Secondary | ICD-10-CM | POA: Diagnosis not present

## 2020-08-07 DIAGNOSIS — E7800 Pure hypercholesterolemia, unspecified: Secondary | ICD-10-CM | POA: Diagnosis not present

## 2020-08-07 IMAGING — CT CT HEAD W/O CM
4 series · 16 of 47 positions shown, 18 images · non-contrast
Comparison: None.

CLINICAL DATA: Focal neuro deficit, > 6 hrs, stroke suspected.
Left-sided numbness.

EXAM:
CT HEAD WITHOUT CONTRAST
TECHNIQUE: Contiguous axial images were obtained from the base of the skull
through the vertex without intravenous contrast.

[Series 3: head without · axial · non-contrast · 0.46mm/px · z∈[-128,-3]mm · 7 of 35 slices shown, 9 images]
[im 5/35  brain]
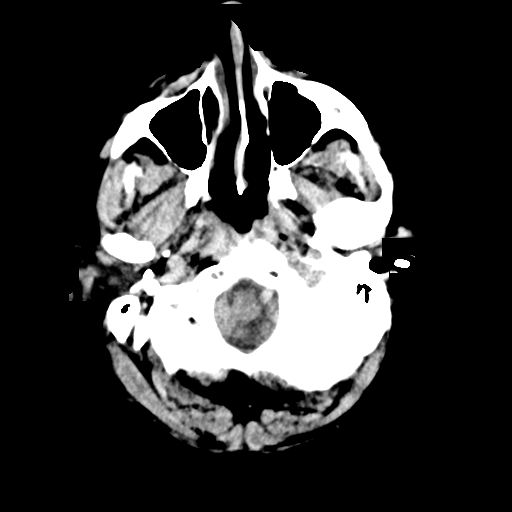
[im 5/35  bone]
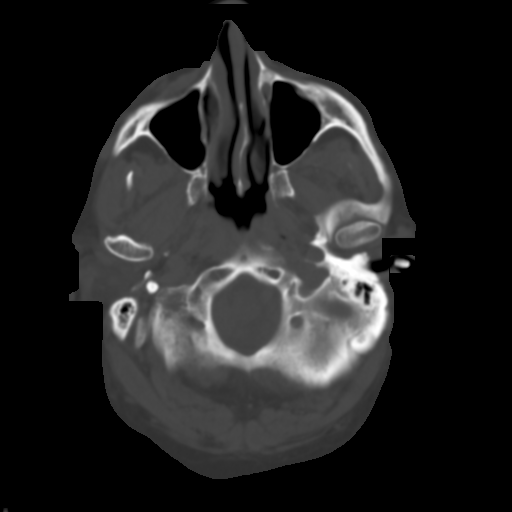
[im 9/35  brain]
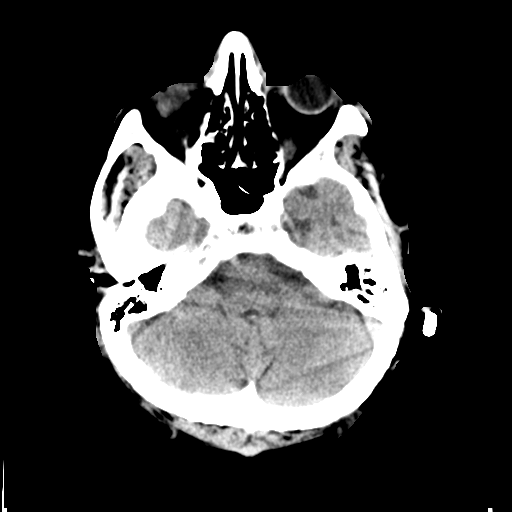
[im 13/35  brain]
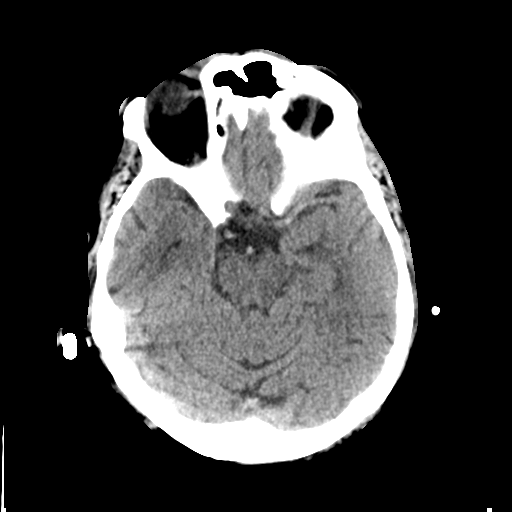
[im 18/35  brain]
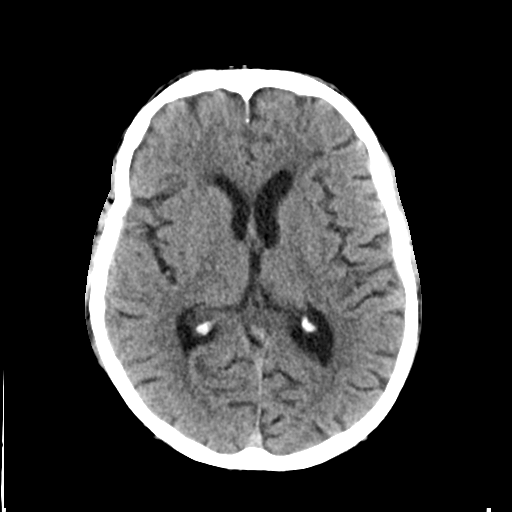
[im 22/35  brain]
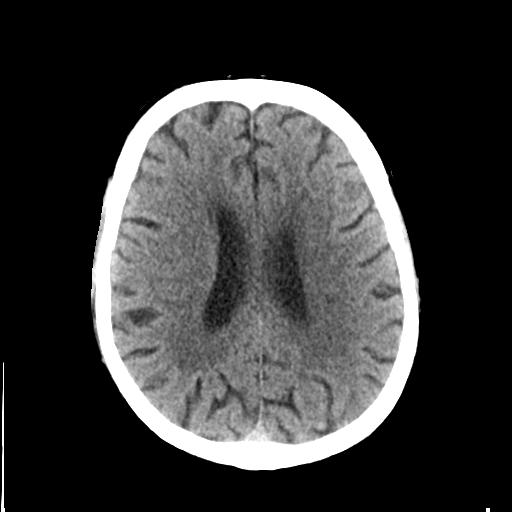
[im 22/35  bone]
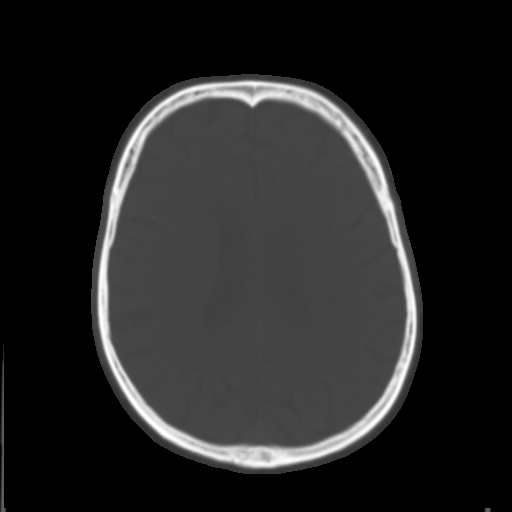
[im 26/35  brain]
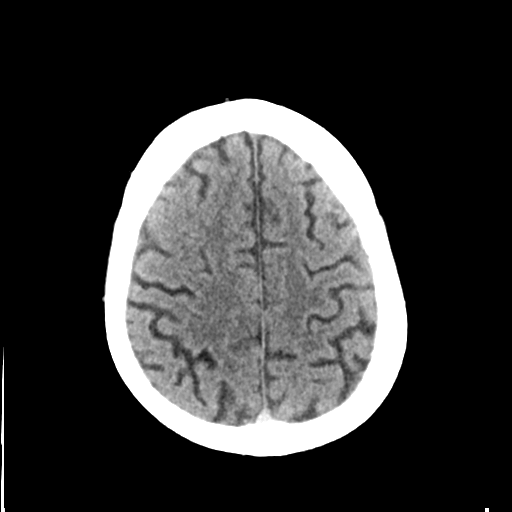
[im 30/35  brain]
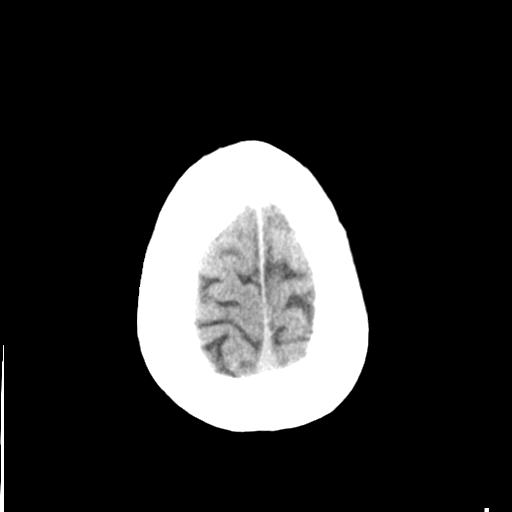

[Series 4: head bone · axial · 0.46mm/px · z∈[-132,-96]mm · 3 of 88 slices shown]
[im 9/88  bone]
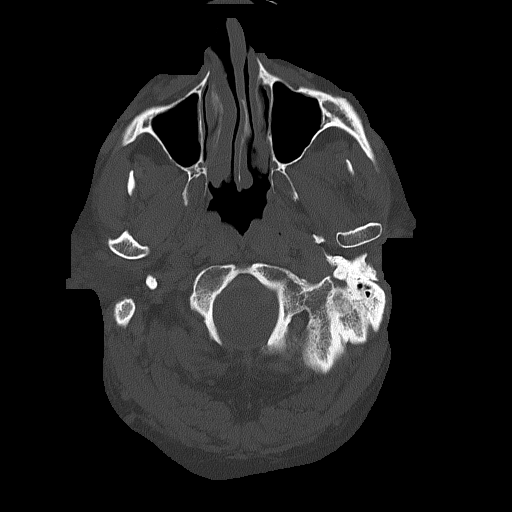
[im 18/88  bone]
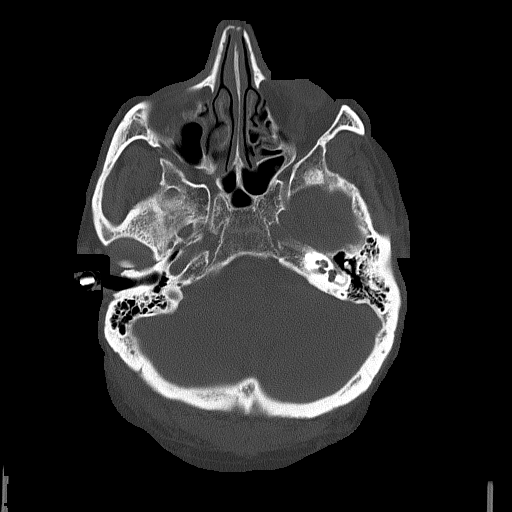
[im 27/88  bone]
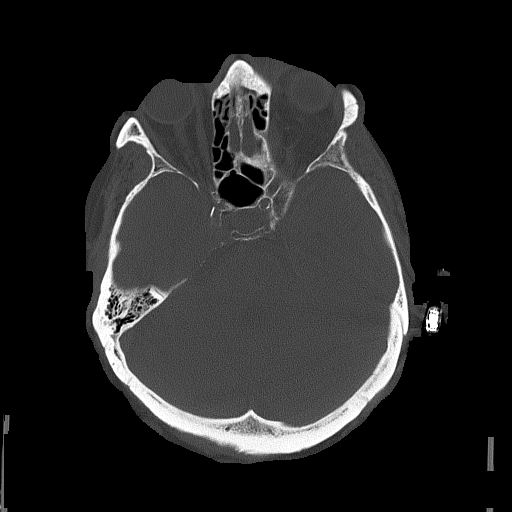

[Series 5: head without cor · coronal · non-contrast · 0.35mm/px · 3 of 73 slices shown]
[im 25/73  brain]
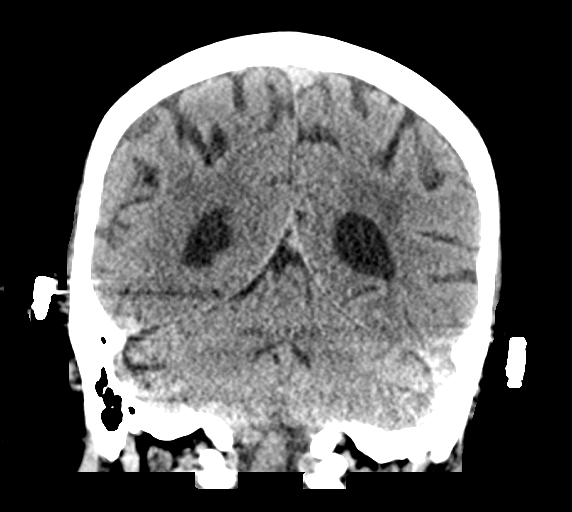
[im 33/73  brain]
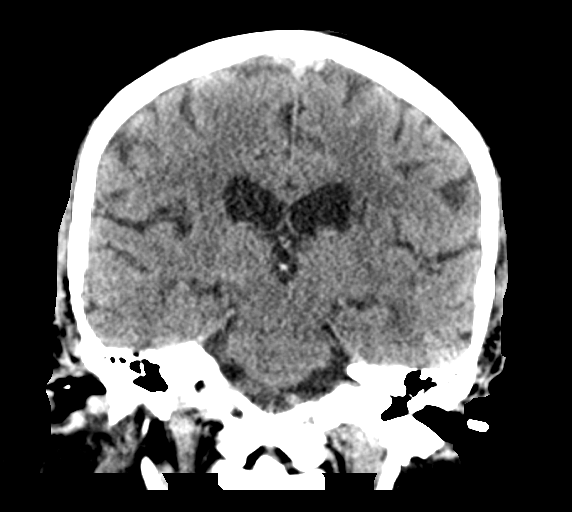
[im 41/73  brain]
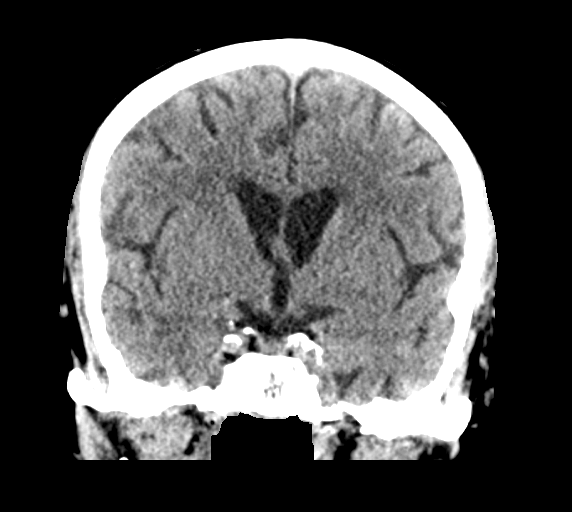

[Series 6: head without sag · sagittal · non-contrast · 0.33mm/px · 3 of 67 slices shown]
[im 23/67  brain]
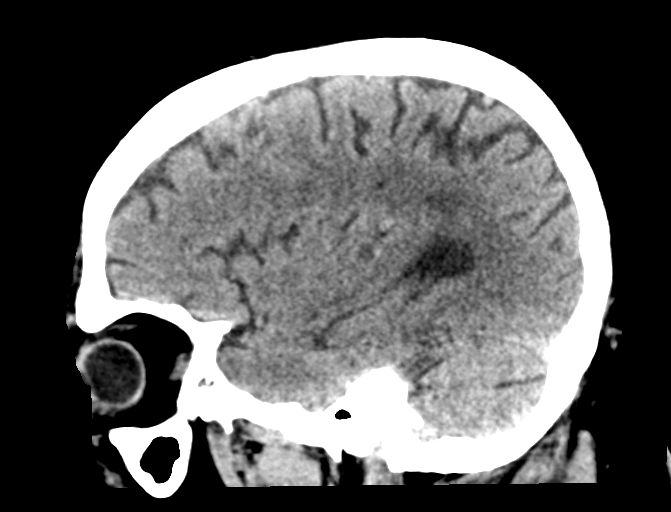
[im 34/67  brain]
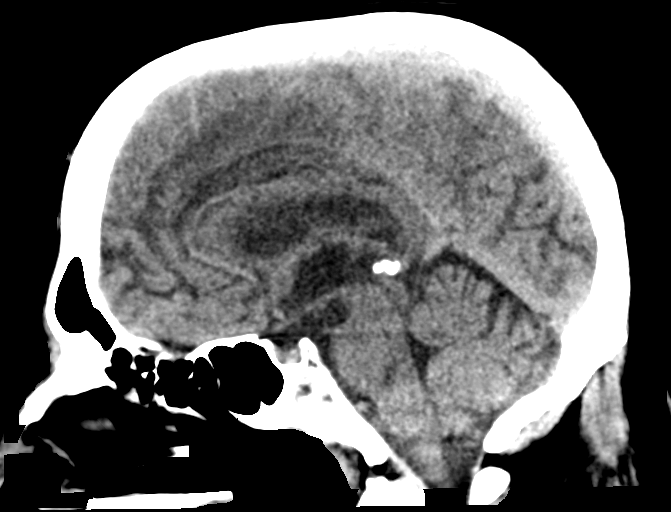
[im 45/67  brain]
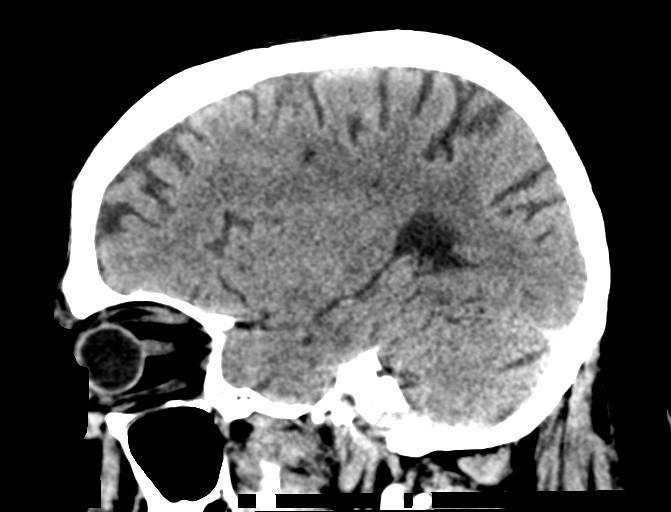

[16 of 47 positions shown; findings below may reference images not displayed]

FINDINGS: Brain: No intracranial hemorrhage, mass effect, or midline shift.
Brain volume is normal for age. No hydrocephalus. The basilar
cisterns are patent. Small remote lacunar infarct in the left basal
ganglia. Minor periventricular white matter hypodensities,
nonspecific but typically chronic small vessel ischemia. Possible
chronic ischemic change in the right pons. No evidence of
territorial infarct or acute ischemia. No extra-axial or
intracranial fluid collection.

Vascular: Atherosclerosis of skullbase vasculature without
hyperdense vessel or abnormal calcification.

Skull: No fracture or focal lesion.

Sinuses/Orbits: No acute findings. Bilateral cataract resection.

Other: None.
IMPRESSION: 1. No acute intracranial abnormality.
2. Small remote lacunar infarct in the left basal ganglia. Mild
periventricular white matter hypodensity, nonspecific but typically
chronic small vessel ischemia.

## 2020-08-07 IMAGING — MR MR HEAD W/O CM
10 of 11 series · 43 of 48 positions shown · non-contrast
Comparison: Head CT from earlier today

CLINICAL DATA: Left-sided numbness that started yesterday

EXAM:
MRI HEAD WITHOUT CONTRAST
TECHNIQUE: Multiplanar, multiecho pulse sequences of the brain and surrounding
structures were obtained without intravenous contrast.

[Series 5: DWI · axial · 3.0mm · 0.88mm/px · z∈[-38,+96]mm · 10 of 96 slices shown (1 of 4)]
[im 1/96]
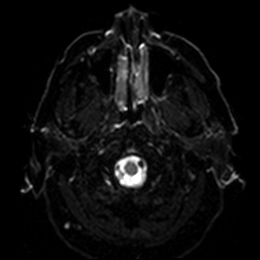
[im 11/96]
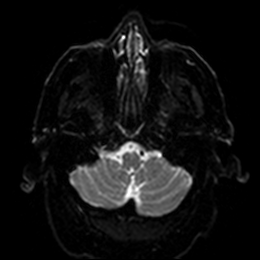
[im 22/96]
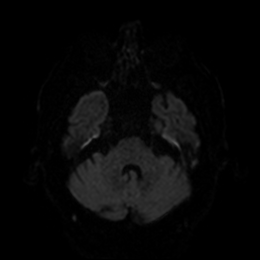
[im 32/96]
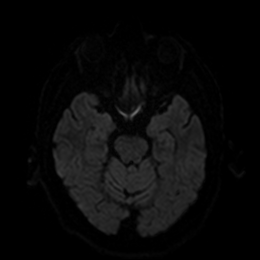
[im 43/96]
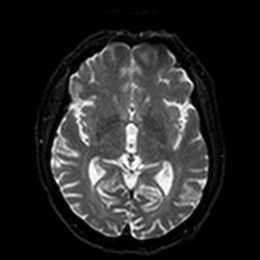
[im 53/96]
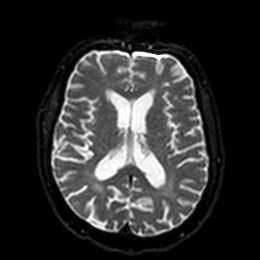
[im 64/96]
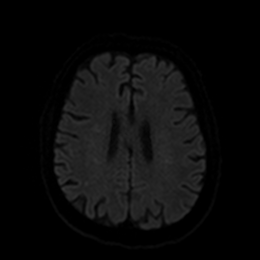
[im 74/96]
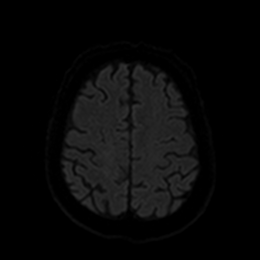
[im 85/96]
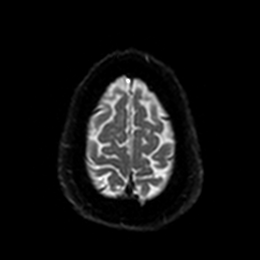
[im 96/96]
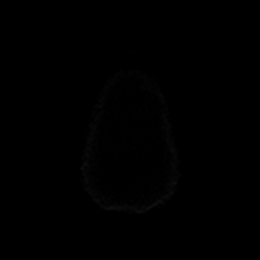

[Series 6: DWI · axial · 3.0mm · 0.88mm/px · z∈[-38,+96]mm · 5 of 47 slices shown (2 of 4)]
[im 1/47]
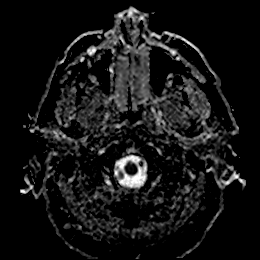
[im 12/47]
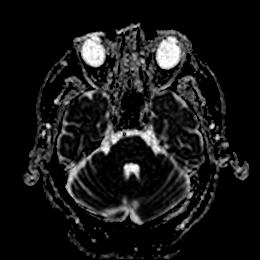
[im 24/47]
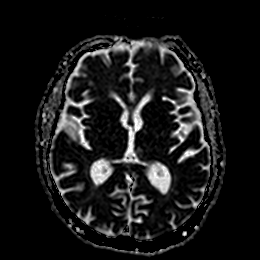
[im 35/47]
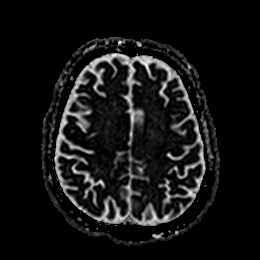
[im 47/47]
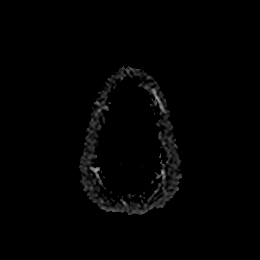

[Series 7: DWI · coronal · 4.0mm · 0.88mm/px · 6 of 72 slices shown (3 of 4)]
[im 1/72]
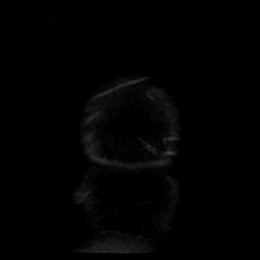
[im 15/72]
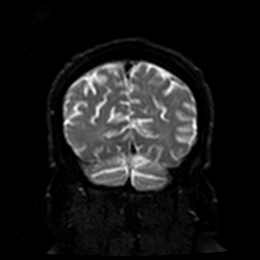
[im 29/72]
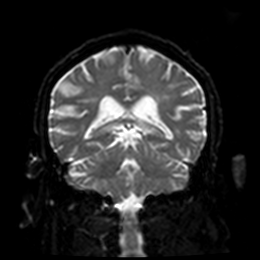
[im 43/72]
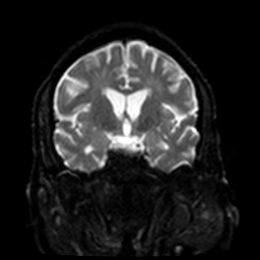
[im 57/72]
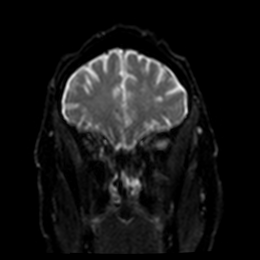
[im 72/72]
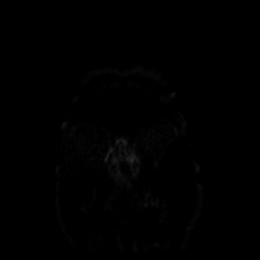

[Series 8: DWI · coronal · 4.0mm · 0.88mm/px · 3 of 36 slices shown (4 of 4)]
[im 1/36]
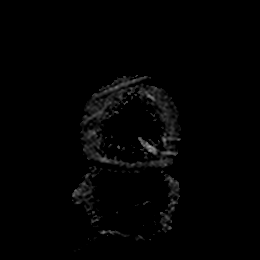
[im 18/36]
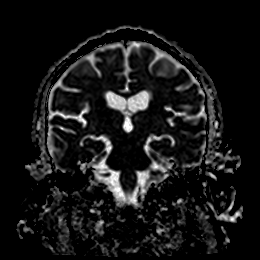
[im 36/36]
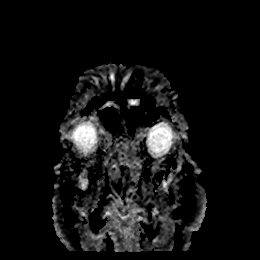

[Series 9: T1 · sagittal · 5.0mm · 0.75mm/px · 2 of 25 slices shown]
[im 1/25]
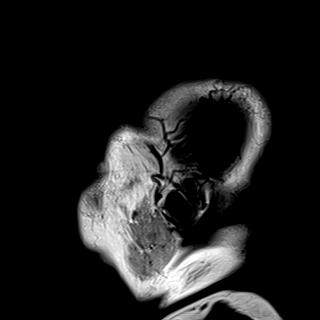
[im 25/25]
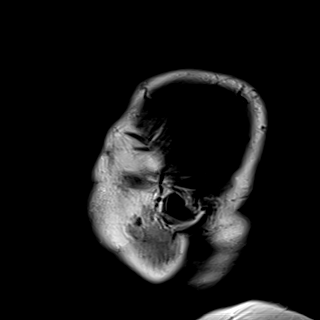

[Series 10: T2 · axial · 5.0mm · 0.72mm/px · z∈[-35,+107]mm · 2 of 27 slices shown (1 of 2)]
[im 1/27]
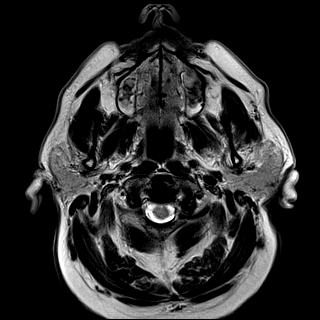
[im 27/27]
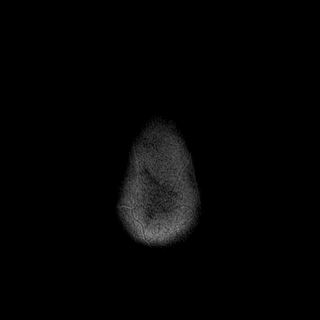

[Series 11: FLAIR · axial · 5.0mm · 0.45mm/px · z∈[-36,+106]mm · 2 of 27 slices shown]
[im 1/27]
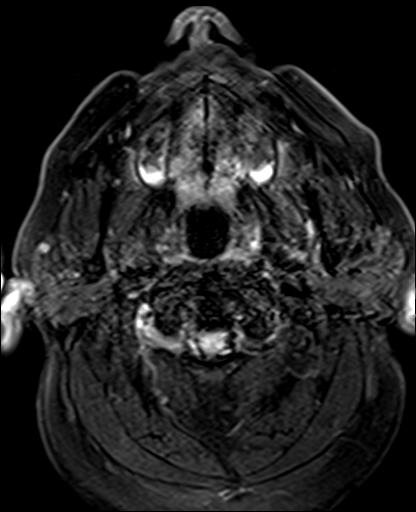
[im 27/27]
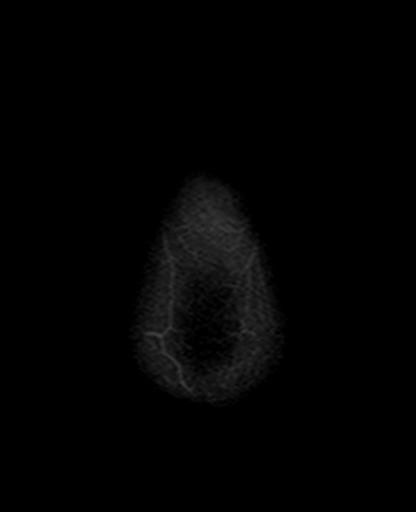

[Series 13: pha_images · axial · 3.0mm · 0.90mm/px · z∈[-45,+114]mm · 5 of 58 slices shown]
[im 1/58]
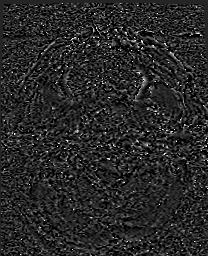
[im 15/58]
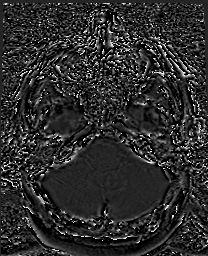
[im 29/58]
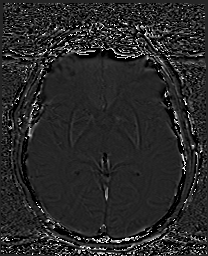
[im 43/58]
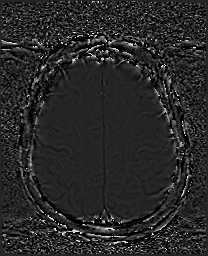
[im 58/58]
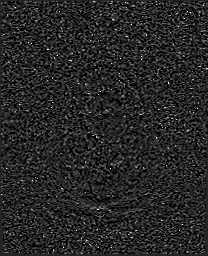

[Series 14: swi_images · axial · 3.0mm · 0.90mm/px · z∈[-45,+116]mm · 5 of 60 slices shown]
[im 1/60]
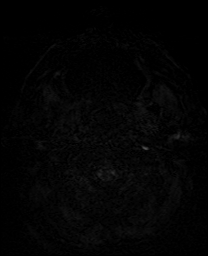
[im 15/60]
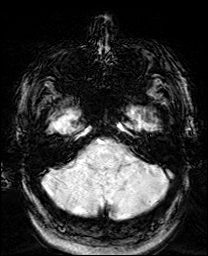
[im 30/60]
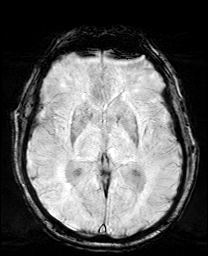
[im 45/60]
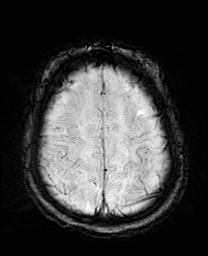
[im 60/60]
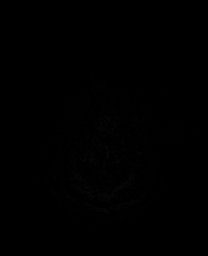

[Series 17: T2 · coronal · 5.0mm · 0.34mm/px · 3 of 31 slices shown (2 of 2)]
[im 1/31]
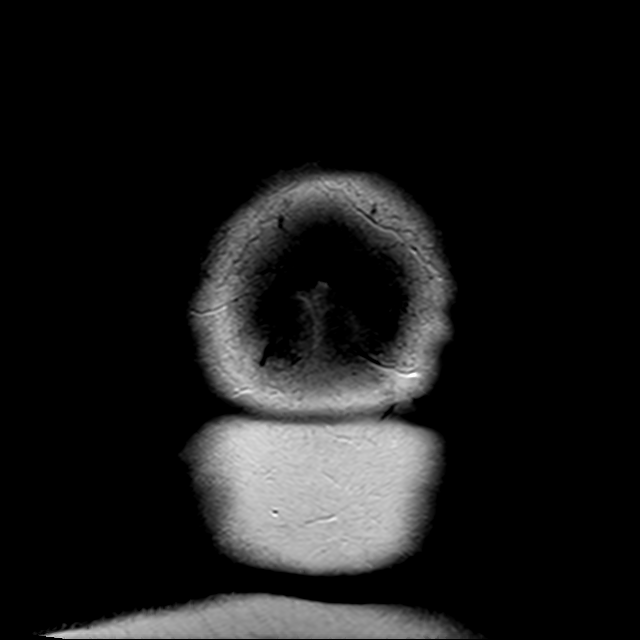
[im 16/31]
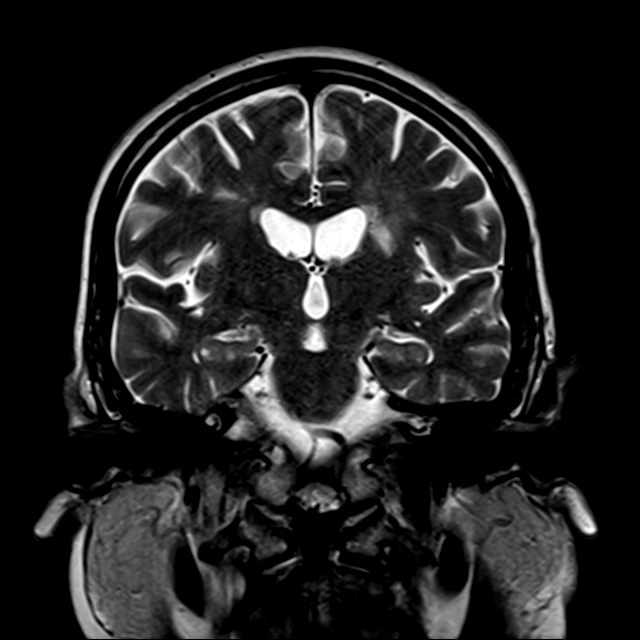
[im 31/31]
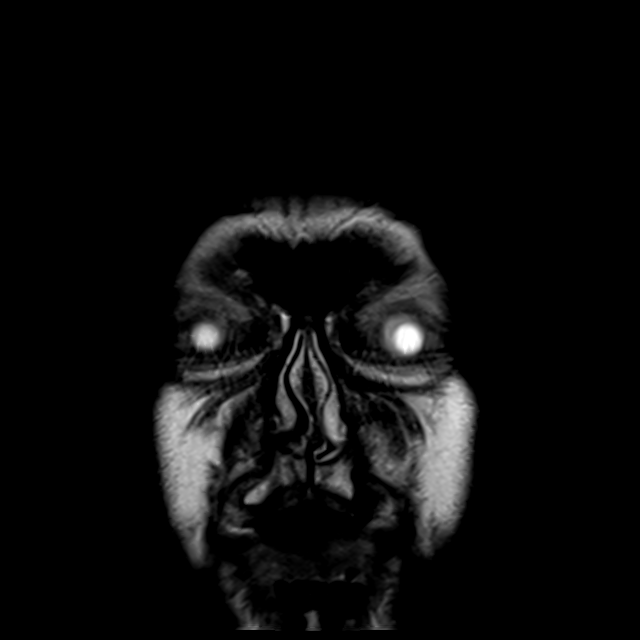

[43 of 48 positions shown; findings below may reference images not displayed]

FINDINGS: Brain: Subcentimeter restricted diffusion in the right thalamus.
Subcentimeter weakly restricted diffusion in the right centrum
semiovale. There is a punctate diffusion hyperintense focus in the
right pons on coronal images that is not definite based on the other
sequences. There is mild to moderate FLAIR hyperintensity in the
cerebral white matter with areas of chronic lacune is in the
bilateral centrum semiovale and left frontal white matter. Few
remote microhemorrhages with nonspecific pattern given limited
extent. No acute hemorrhage, hydrocephalus, or mass.

Vascular: Normal flow voids

Skull and upper cervical spine: Normal marrow signal

Sinuses/Orbits: Bilateral cataract resection.
IMPRESSION: 1. Acute lacunar infarct in the right thalamus.
2. Subacute lacunar infarct in the right centrum semiovale.
3. Chronic small vessel ischemia with remote lacunar infarcts.

## 2020-08-17 ENCOUNTER — Other Ambulatory Visit (HOSPITAL_BASED_OUTPATIENT_CLINIC_OR_DEPARTMENT_OTHER): Payer: Self-pay

## 2020-08-23 NOTE — Progress Notes (Signed)
Carelink Summary Report / Loop Recorder 

## 2020-08-29 DIAGNOSIS — Z125 Encounter for screening for malignant neoplasm of prostate: Secondary | ICD-10-CM | POA: Diagnosis not present

## 2020-08-29 DIAGNOSIS — E1121 Type 2 diabetes mellitus with diabetic nephropathy: Secondary | ICD-10-CM | POA: Diagnosis not present

## 2020-08-29 DIAGNOSIS — Z7984 Long term (current) use of oral hypoglycemic drugs: Secondary | ICD-10-CM | POA: Diagnosis not present

## 2020-08-29 DIAGNOSIS — I25118 Atherosclerotic heart disease of native coronary artery with other forms of angina pectoris: Secondary | ICD-10-CM | POA: Diagnosis not present

## 2020-08-29 DIAGNOSIS — I69398 Other sequelae of cerebral infarction: Secondary | ICD-10-CM | POA: Diagnosis not present

## 2020-08-29 DIAGNOSIS — E782 Mixed hyperlipidemia: Secondary | ICD-10-CM | POA: Diagnosis not present

## 2020-08-29 DIAGNOSIS — I639 Cerebral infarction, unspecified: Secondary | ICD-10-CM | POA: Diagnosis not present

## 2020-08-29 DIAGNOSIS — Z0000 Encounter for general adult medical examination without abnormal findings: Secondary | ICD-10-CM | POA: Diagnosis not present

## 2020-08-29 DIAGNOSIS — I4892 Unspecified atrial flutter: Secondary | ICD-10-CM | POA: Diagnosis not present

## 2020-08-29 DIAGNOSIS — I130 Hypertensive heart and chronic kidney disease with heart failure and stage 1 through stage 4 chronic kidney disease, or unspecified chronic kidney disease: Secondary | ICD-10-CM | POA: Diagnosis not present

## 2020-08-29 DIAGNOSIS — I10 Essential (primary) hypertension: Secondary | ICD-10-CM | POA: Diagnosis not present

## 2020-08-29 DIAGNOSIS — Z794 Long term (current) use of insulin: Secondary | ICD-10-CM | POA: Diagnosis not present

## 2020-08-29 DIAGNOSIS — N183 Chronic kidney disease, stage 3 unspecified: Secondary | ICD-10-CM | POA: Diagnosis not present

## 2020-09-03 ENCOUNTER — Ambulatory Visit (INDEPENDENT_AMBULATORY_CARE_PROVIDER_SITE_OTHER): Payer: Medicare HMO

## 2020-09-03 DIAGNOSIS — I639 Cerebral infarction, unspecified: Secondary | ICD-10-CM

## 2020-09-06 LAB — CUP PACEART REMOTE DEVICE CHECK
Date Time Interrogation Session: 20220720230506
Implantable Pulse Generator Implant Date: 20210310

## 2020-09-07 DIAGNOSIS — E1165 Type 2 diabetes mellitus with hyperglycemia: Secondary | ICD-10-CM | POA: Diagnosis not present

## 2020-09-09 DIAGNOSIS — H35033 Hypertensive retinopathy, bilateral: Secondary | ICD-10-CM | POA: Diagnosis not present

## 2020-09-09 DIAGNOSIS — I25118 Atherosclerotic heart disease of native coronary artery with other forms of angina pectoris: Secondary | ICD-10-CM | POA: Diagnosis not present

## 2020-09-09 DIAGNOSIS — I130 Hypertensive heart and chronic kidney disease with heart failure and stage 1 through stage 4 chronic kidney disease, or unspecified chronic kidney disease: Secondary | ICD-10-CM | POA: Diagnosis not present

## 2020-09-09 DIAGNOSIS — I10 Essential (primary) hypertension: Secondary | ICD-10-CM | POA: Diagnosis not present

## 2020-09-09 DIAGNOSIS — E1121 Type 2 diabetes mellitus with diabetic nephropathy: Secondary | ICD-10-CM | POA: Diagnosis not present

## 2020-09-09 DIAGNOSIS — E782 Mixed hyperlipidemia: Secondary | ICD-10-CM | POA: Diagnosis not present

## 2020-09-09 DIAGNOSIS — E7800 Pure hypercholesterolemia, unspecified: Secondary | ICD-10-CM | POA: Diagnosis not present

## 2020-09-09 DIAGNOSIS — I5042 Chronic combined systolic (congestive) and diastolic (congestive) heart failure: Secondary | ICD-10-CM | POA: Diagnosis not present

## 2020-09-09 DIAGNOSIS — E1122 Type 2 diabetes mellitus with diabetic chronic kidney disease: Secondary | ICD-10-CM | POA: Diagnosis not present

## 2020-09-13 DIAGNOSIS — E119 Type 2 diabetes mellitus without complications: Secondary | ICD-10-CM | POA: Diagnosis not present

## 2020-09-13 DIAGNOSIS — E11319 Type 2 diabetes mellitus with unspecified diabetic retinopathy without macular edema: Secondary | ICD-10-CM | POA: Diagnosis not present

## 2020-09-13 DIAGNOSIS — I25810 Atherosclerosis of coronary artery bypass graft(s) without angina pectoris: Secondary | ICD-10-CM | POA: Diagnosis not present

## 2020-09-13 DIAGNOSIS — E1165 Type 2 diabetes mellitus with hyperglycemia: Secondary | ICD-10-CM | POA: Diagnosis not present

## 2020-09-18 ENCOUNTER — Other Ambulatory Visit (HOSPITAL_BASED_OUTPATIENT_CLINIC_OR_DEPARTMENT_OTHER): Payer: Self-pay

## 2020-09-25 NOTE — Progress Notes (Signed)
Carelink Summary Report / Loop Recorder 

## 2020-10-10 DIAGNOSIS — I5042 Chronic combined systolic (congestive) and diastolic (congestive) heart failure: Secondary | ICD-10-CM | POA: Diagnosis not present

## 2020-10-10 DIAGNOSIS — I10 Essential (primary) hypertension: Secondary | ICD-10-CM | POA: Diagnosis not present

## 2020-10-10 DIAGNOSIS — I130 Hypertensive heart and chronic kidney disease with heart failure and stage 1 through stage 4 chronic kidney disease, or unspecified chronic kidney disease: Secondary | ICD-10-CM | POA: Diagnosis not present

## 2020-10-10 DIAGNOSIS — I0981 Rheumatic heart failure: Secondary | ICD-10-CM | POA: Diagnosis not present

## 2020-10-10 DIAGNOSIS — E1121 Type 2 diabetes mellitus with diabetic nephropathy: Secondary | ICD-10-CM | POA: Diagnosis not present

## 2020-10-10 DIAGNOSIS — E782 Mixed hyperlipidemia: Secondary | ICD-10-CM | POA: Diagnosis not present

## 2020-10-10 DIAGNOSIS — I25119 Atherosclerotic heart disease of native coronary artery with unspecified angina pectoris: Secondary | ICD-10-CM | POA: Diagnosis not present

## 2020-10-10 DIAGNOSIS — E1122 Type 2 diabetes mellitus with diabetic chronic kidney disease: Secondary | ICD-10-CM | POA: Diagnosis not present

## 2020-10-10 DIAGNOSIS — H35033 Hypertensive retinopathy, bilateral: Secondary | ICD-10-CM | POA: Diagnosis not present

## 2020-10-18 DIAGNOSIS — N401 Enlarged prostate with lower urinary tract symptoms: Secondary | ICD-10-CM | POA: Diagnosis not present

## 2020-10-18 DIAGNOSIS — R350 Frequency of micturition: Secondary | ICD-10-CM | POA: Diagnosis not present

## 2020-10-18 DIAGNOSIS — N3941 Urge incontinence: Secondary | ICD-10-CM | POA: Diagnosis not present

## 2020-10-18 DIAGNOSIS — R972 Elevated prostate specific antigen [PSA]: Secondary | ICD-10-CM | POA: Diagnosis not present

## 2020-11-02 DIAGNOSIS — I130 Hypertensive heart and chronic kidney disease with heart failure and stage 1 through stage 4 chronic kidney disease, or unspecified chronic kidney disease: Secondary | ICD-10-CM | POA: Diagnosis not present

## 2020-11-02 DIAGNOSIS — I5042 Chronic combined systolic (congestive) and diastolic (congestive) heart failure: Secondary | ICD-10-CM | POA: Diagnosis not present

## 2020-11-02 DIAGNOSIS — H35033 Hypertensive retinopathy, bilateral: Secondary | ICD-10-CM | POA: Diagnosis not present

## 2020-11-02 DIAGNOSIS — I25118 Atherosclerotic heart disease of native coronary artery with other forms of angina pectoris: Secondary | ICD-10-CM | POA: Diagnosis not present

## 2020-11-02 DIAGNOSIS — E782 Mixed hyperlipidemia: Secondary | ICD-10-CM | POA: Diagnosis not present

## 2020-11-02 DIAGNOSIS — N183 Chronic kidney disease, stage 3 unspecified: Secondary | ICD-10-CM | POA: Diagnosis not present

## 2020-11-02 DIAGNOSIS — E1121 Type 2 diabetes mellitus with diabetic nephropathy: Secondary | ICD-10-CM | POA: Diagnosis not present

## 2020-11-02 DIAGNOSIS — I10 Essential (primary) hypertension: Secondary | ICD-10-CM | POA: Diagnosis not present

## 2020-11-02 DIAGNOSIS — I25119 Atherosclerotic heart disease of native coronary artery with unspecified angina pectoris: Secondary | ICD-10-CM | POA: Diagnosis not present

## 2020-11-16 DIAGNOSIS — E119 Type 2 diabetes mellitus without complications: Secondary | ICD-10-CM | POA: Diagnosis not present

## 2020-11-27 DIAGNOSIS — E113293 Type 2 diabetes mellitus with mild nonproliferative diabetic retinopathy without macular edema, bilateral: Secondary | ICD-10-CM | POA: Diagnosis not present

## 2020-11-27 DIAGNOSIS — H26491 Other secondary cataract, right eye: Secondary | ICD-10-CM | POA: Diagnosis not present

## 2020-11-27 DIAGNOSIS — H34231 Retinal artery branch occlusion, right eye: Secondary | ICD-10-CM | POA: Diagnosis not present

## 2020-11-27 DIAGNOSIS — H35033 Hypertensive retinopathy, bilateral: Secondary | ICD-10-CM | POA: Diagnosis not present

## 2020-12-16 DIAGNOSIS — E119 Type 2 diabetes mellitus without complications: Secondary | ICD-10-CM | POA: Diagnosis not present

## 2021-01-16 DIAGNOSIS — H35033 Hypertensive retinopathy, bilateral: Secondary | ICD-10-CM | POA: Diagnosis not present

## 2021-01-16 DIAGNOSIS — N183 Chronic kidney disease, stage 3 unspecified: Secondary | ICD-10-CM | POA: Diagnosis not present

## 2021-01-16 DIAGNOSIS — M150 Primary generalized (osteo)arthritis: Secondary | ICD-10-CM | POA: Diagnosis not present

## 2021-01-16 DIAGNOSIS — E1121 Type 2 diabetes mellitus with diabetic nephropathy: Secondary | ICD-10-CM | POA: Diagnosis not present

## 2021-01-16 DIAGNOSIS — E782 Mixed hyperlipidemia: Secondary | ICD-10-CM | POA: Diagnosis not present

## 2021-01-16 DIAGNOSIS — E119 Type 2 diabetes mellitus without complications: Secondary | ICD-10-CM | POA: Diagnosis not present

## 2021-01-16 DIAGNOSIS — I130 Hypertensive heart and chronic kidney disease with heart failure and stage 1 through stage 4 chronic kidney disease, or unspecified chronic kidney disease: Secondary | ICD-10-CM | POA: Diagnosis not present

## 2021-01-16 DIAGNOSIS — I10 Essential (primary) hypertension: Secondary | ICD-10-CM | POA: Diagnosis not present

## 2021-01-21 DIAGNOSIS — R972 Elevated prostate specific antigen [PSA]: Secondary | ICD-10-CM | POA: Diagnosis not present

## 2021-01-28 DIAGNOSIS — Z23 Encounter for immunization: Secondary | ICD-10-CM | POA: Diagnosis not present

## 2021-02-07 DIAGNOSIS — N183 Chronic kidney disease, stage 3 unspecified: Secondary | ICD-10-CM | POA: Diagnosis not present

## 2021-02-07 DIAGNOSIS — I5042 Chronic combined systolic (congestive) and diastolic (congestive) heart failure: Secondary | ICD-10-CM | POA: Diagnosis not present

## 2021-02-07 DIAGNOSIS — I25118 Atherosclerotic heart disease of native coronary artery with other forms of angina pectoris: Secondary | ICD-10-CM | POA: Diagnosis not present

## 2021-02-07 DIAGNOSIS — I130 Hypertensive heart and chronic kidney disease with heart failure and stage 1 through stage 4 chronic kidney disease, or unspecified chronic kidney disease: Secondary | ICD-10-CM | POA: Diagnosis not present

## 2021-02-07 DIAGNOSIS — I10 Essential (primary) hypertension: Secondary | ICD-10-CM | POA: Diagnosis not present

## 2021-02-07 DIAGNOSIS — H35033 Hypertensive retinopathy, bilateral: Secondary | ICD-10-CM | POA: Diagnosis not present

## 2021-02-07 DIAGNOSIS — E1121 Type 2 diabetes mellitus with diabetic nephropathy: Secondary | ICD-10-CM | POA: Diagnosis not present

## 2021-02-07 DIAGNOSIS — I0981 Rheumatic heart failure: Secondary | ICD-10-CM | POA: Diagnosis not present

## 2021-02-07 DIAGNOSIS — E782 Mixed hyperlipidemia: Secondary | ICD-10-CM | POA: Diagnosis not present

## 2021-02-15 DIAGNOSIS — E119 Type 2 diabetes mellitus without complications: Secondary | ICD-10-CM | POA: Diagnosis not present

## 2021-02-20 DIAGNOSIS — I2510 Atherosclerotic heart disease of native coronary artery without angina pectoris: Secondary | ICD-10-CM | POA: Diagnosis not present

## 2021-02-20 DIAGNOSIS — I5042 Chronic combined systolic (congestive) and diastolic (congestive) heart failure: Secondary | ICD-10-CM | POA: Diagnosis not present

## 2021-02-20 DIAGNOSIS — I10 Essential (primary) hypertension: Secondary | ICD-10-CM | POA: Diagnosis not present

## 2021-02-20 DIAGNOSIS — I0981 Rheumatic heart failure: Secondary | ICD-10-CM | POA: Diagnosis not present

## 2021-02-20 DIAGNOSIS — I25118 Atherosclerotic heart disease of native coronary artery with other forms of angina pectoris: Secondary | ICD-10-CM | POA: Diagnosis not present

## 2021-02-20 DIAGNOSIS — E7800 Pure hypercholesterolemia, unspecified: Secondary | ICD-10-CM | POA: Diagnosis not present

## 2021-02-20 DIAGNOSIS — N183 Chronic kidney disease, stage 3 unspecified: Secondary | ICD-10-CM | POA: Diagnosis not present

## 2021-02-20 DIAGNOSIS — E1122 Type 2 diabetes mellitus with diabetic chronic kidney disease: Secondary | ICD-10-CM | POA: Diagnosis not present

## 2021-02-20 DIAGNOSIS — I130 Hypertensive heart and chronic kidney disease with heart failure and stage 1 through stage 4 chronic kidney disease, or unspecified chronic kidney disease: Secondary | ICD-10-CM | POA: Diagnosis not present

## 2021-02-22 DIAGNOSIS — N183 Chronic kidney disease, stage 3 unspecified: Secondary | ICD-10-CM | POA: Diagnosis not present

## 2021-02-22 DIAGNOSIS — I872 Venous insufficiency (chronic) (peripheral): Secondary | ICD-10-CM | POA: Diagnosis not present

## 2021-02-22 DIAGNOSIS — I700 Atherosclerosis of aorta: Secondary | ICD-10-CM | POA: Diagnosis not present

## 2021-02-22 DIAGNOSIS — E782 Mixed hyperlipidemia: Secondary | ICD-10-CM | POA: Diagnosis not present

## 2021-02-22 DIAGNOSIS — I5032 Chronic diastolic (congestive) heart failure: Secondary | ICD-10-CM | POA: Diagnosis not present

## 2021-02-22 DIAGNOSIS — I10 Essential (primary) hypertension: Secondary | ICD-10-CM | POA: Diagnosis not present

## 2021-02-22 DIAGNOSIS — E1121 Type 2 diabetes mellitus with diabetic nephropathy: Secondary | ICD-10-CM | POA: Diagnosis not present

## 2021-02-22 DIAGNOSIS — L03119 Cellulitis of unspecified part of limb: Secondary | ICD-10-CM | POA: Diagnosis not present

## 2021-02-23 ENCOUNTER — Emergency Department (HOSPITAL_COMMUNITY): Payer: Medicare HMO

## 2021-02-23 ENCOUNTER — Encounter (HOSPITAL_COMMUNITY): Payer: Self-pay | Admitting: *Deleted

## 2021-02-23 ENCOUNTER — Other Ambulatory Visit: Payer: Self-pay

## 2021-02-23 ENCOUNTER — Inpatient Hospital Stay (HOSPITAL_COMMUNITY)
Admission: EM | Admit: 2021-02-23 | Discharge: 2021-02-28 | DRG: 602 | Disposition: A | Discharge status: Home Health | Payer: Medicare HMO | Attending: Internal Medicine | Admitting: Internal Medicine

## 2021-02-23 DIAGNOSIS — Z20822 Contact with and (suspected) exposure to covid-19: Secondary | ICD-10-CM | POA: Diagnosis present

## 2021-02-23 DIAGNOSIS — E785 Hyperlipidemia, unspecified: Secondary | ICD-10-CM | POA: Diagnosis present

## 2021-02-23 DIAGNOSIS — L0390 Cellulitis, unspecified: Secondary | ICD-10-CM | POA: Diagnosis not present

## 2021-02-23 DIAGNOSIS — I110 Hypertensive heart disease with heart failure: Secondary | ICD-10-CM | POA: Diagnosis not present

## 2021-02-23 DIAGNOSIS — L03116 Cellulitis of left lower limb: Secondary | ICD-10-CM | POA: Diagnosis not present

## 2021-02-23 DIAGNOSIS — I5041 Acute combined systolic (congestive) and diastolic (congestive) heart failure: Secondary | ICD-10-CM

## 2021-02-23 DIAGNOSIS — I714 Abdominal aortic aneurysm, without rupture, unspecified: Secondary | ICD-10-CM | POA: Diagnosis present

## 2021-02-23 DIAGNOSIS — I4892 Unspecified atrial flutter: Secondary | ICD-10-CM | POA: Diagnosis present

## 2021-02-23 DIAGNOSIS — N189 Chronic kidney disease, unspecified: Secondary | ICD-10-CM

## 2021-02-23 DIAGNOSIS — Z6835 Body mass index (BMI) 35.0-35.9, adult: Secondary | ICD-10-CM

## 2021-02-23 DIAGNOSIS — R0602 Shortness of breath: Secondary | ICD-10-CM | POA: Diagnosis not present

## 2021-02-23 DIAGNOSIS — L03119 Cellulitis of unspecified part of limb: Secondary | ICD-10-CM

## 2021-02-23 DIAGNOSIS — N179 Acute kidney failure, unspecified: Secondary | ICD-10-CM | POA: Diagnosis not present

## 2021-02-23 DIAGNOSIS — Z79899 Other long term (current) drug therapy: Secondary | ICD-10-CM

## 2021-02-23 DIAGNOSIS — I509 Heart failure, unspecified: Secondary | ICD-10-CM | POA: Diagnosis not present

## 2021-02-23 DIAGNOSIS — E1165 Type 2 diabetes mellitus with hyperglycemia: Secondary | ICD-10-CM | POA: Diagnosis present

## 2021-02-23 DIAGNOSIS — Z888 Allergy status to other drugs, medicaments and biological substances status: Secondary | ICD-10-CM

## 2021-02-23 DIAGNOSIS — E669 Obesity, unspecified: Secondary | ICD-10-CM | POA: Diagnosis present

## 2021-02-23 DIAGNOSIS — W010XXA Fall on same level from slipping, tripping and stumbling without subsequent striking against object, initial encounter: Secondary | ICD-10-CM | POA: Diagnosis present

## 2021-02-23 DIAGNOSIS — I5043 Acute on chronic combined systolic (congestive) and diastolic (congestive) heart failure: Secondary | ICD-10-CM | POA: Diagnosis not present

## 2021-02-23 DIAGNOSIS — Z955 Presence of coronary angioplasty implant and graft: Secondary | ICD-10-CM

## 2021-02-23 DIAGNOSIS — Z8249 Family history of ischemic heart disease and other diseases of the circulatory system: Secondary | ICD-10-CM

## 2021-02-23 DIAGNOSIS — I872 Venous insufficiency (chronic) (peripheral): Secondary | ICD-10-CM | POA: Diagnosis present

## 2021-02-23 DIAGNOSIS — I2510 Atherosclerotic heart disease of native coronary artery without angina pectoris: Secondary | ICD-10-CM | POA: Diagnosis present

## 2021-02-23 DIAGNOSIS — Z8673 Personal history of transient ischemic attack (TIA), and cerebral infarction without residual deficits: Secondary | ICD-10-CM

## 2021-02-23 DIAGNOSIS — E875 Hyperkalemia: Secondary | ICD-10-CM | POA: Diagnosis present

## 2021-02-23 DIAGNOSIS — Z7902 Long term (current) use of antithrombotics/antiplatelets: Secondary | ICD-10-CM

## 2021-02-23 DIAGNOSIS — I5020 Unspecified systolic (congestive) heart failure: Secondary | ICD-10-CM | POA: Diagnosis not present

## 2021-02-23 DIAGNOSIS — M7989 Other specified soft tissue disorders: Secondary | ICD-10-CM | POA: Diagnosis not present

## 2021-02-23 DIAGNOSIS — R5381 Other malaise: Secondary | ICD-10-CM | POA: Diagnosis not present

## 2021-02-23 DIAGNOSIS — N1832 Chronic kidney disease, stage 3b: Secondary | ICD-10-CM | POA: Diagnosis present

## 2021-02-23 DIAGNOSIS — Z823 Family history of stroke: Secondary | ICD-10-CM

## 2021-02-23 DIAGNOSIS — J811 Chronic pulmonary edema: Secondary | ICD-10-CM | POA: Diagnosis not present

## 2021-02-23 DIAGNOSIS — S40812A Abrasion of left upper arm, initial encounter: Secondary | ICD-10-CM | POA: Diagnosis present

## 2021-02-23 DIAGNOSIS — E871 Hypo-osmolality and hyponatremia: Secondary | ICD-10-CM | POA: Diagnosis present

## 2021-02-23 DIAGNOSIS — E872 Acidosis, unspecified: Secondary | ICD-10-CM | POA: Diagnosis present

## 2021-02-23 DIAGNOSIS — Z951 Presence of aortocoronary bypass graft: Secondary | ICD-10-CM

## 2021-02-23 DIAGNOSIS — J9811 Atelectasis: Secondary | ICD-10-CM | POA: Diagnosis not present

## 2021-02-23 DIAGNOSIS — R609 Edema, unspecified: Secondary | ICD-10-CM

## 2021-02-23 DIAGNOSIS — Z7984 Long term (current) use of oral hypoglycemic drugs: Secondary | ICD-10-CM

## 2021-02-23 DIAGNOSIS — D631 Anemia in chronic kidney disease: Secondary | ICD-10-CM | POA: Diagnosis present

## 2021-02-23 DIAGNOSIS — D649 Anemia, unspecified: Secondary | ICD-10-CM | POA: Diagnosis present

## 2021-02-23 DIAGNOSIS — K219 Gastro-esophageal reflux disease without esophagitis: Secondary | ICD-10-CM | POA: Diagnosis present

## 2021-02-23 DIAGNOSIS — W19XXXA Unspecified fall, initial encounter: Secondary | ICD-10-CM

## 2021-02-23 DIAGNOSIS — Z794 Long term (current) use of insulin: Secondary | ICD-10-CM

## 2021-02-23 DIAGNOSIS — I130 Hypertensive heart and chronic kidney disease with heart failure and stage 1 through stage 4 chronic kidney disease, or unspecified chronic kidney disease: Secondary | ICD-10-CM | POA: Diagnosis not present

## 2021-02-23 DIAGNOSIS — L03115 Cellulitis of right lower limb: Secondary | ICD-10-CM | POA: Diagnosis present

## 2021-02-23 DIAGNOSIS — Y92009 Unspecified place in unspecified non-institutional (private) residence as the place of occurrence of the external cause: Secondary | ICD-10-CM

## 2021-02-23 DIAGNOSIS — E1151 Type 2 diabetes mellitus with diabetic peripheral angiopathy without gangrene: Secondary | ICD-10-CM | POA: Diagnosis present

## 2021-02-23 DIAGNOSIS — I517 Cardiomegaly: Secondary | ICD-10-CM | POA: Diagnosis not present

## 2021-02-23 DIAGNOSIS — N281 Cyst of kidney, acquired: Secondary | ICD-10-CM | POA: Diagnosis not present

## 2021-02-23 DIAGNOSIS — I5021 Acute systolic (congestive) heart failure: Secondary | ICD-10-CM | POA: Diagnosis not present

## 2021-02-23 DIAGNOSIS — E1122 Type 2 diabetes mellitus with diabetic chronic kidney disease: Secondary | ICD-10-CM | POA: Diagnosis present

## 2021-02-23 DIAGNOSIS — M1990 Unspecified osteoarthritis, unspecified site: Secondary | ICD-10-CM | POA: Diagnosis present

## 2021-02-23 DIAGNOSIS — N1831 Chronic kidney disease, stage 3a: Secondary | ICD-10-CM | POA: Diagnosis not present

## 2021-02-23 LAB — COMPREHENSIVE METABOLIC PANEL
ALT: 16 U/L (ref 0–44)
AST: 17 U/L (ref 15–41)
Albumin: 3.6 g/dL (ref 3.5–5.0)
Alkaline Phosphatase: 118 U/L (ref 38–126)
Anion gap: 9 (ref 5–15)
BUN: 79 mg/dL — ABNORMAL HIGH (ref 8–23)
CO2: 19 mmol/L — ABNORMAL LOW (ref 22–32)
Calcium: 8.7 mg/dL — ABNORMAL LOW (ref 8.9–10.3)
Chloride: 109 mmol/L (ref 98–111)
Creatinine, Ser: 3.14 mg/dL — ABNORMAL HIGH (ref 0.61–1.24)
GFR, Estimated: 20 mL/min — ABNORMAL LOW (ref >60)
Glucose, Bld: 139 mg/dL — ABNORMAL HIGH (ref 70–99)
Potassium: 6.5 mmol/L (ref 3.5–5.1)
Sodium: 137 mmol/L (ref 135–145)
Total Bilirubin: 0.3 mg/dL (ref 0.3–1.2)
Total Protein: 7.6 g/dL (ref 6.5–8.1)

## 2021-02-23 LAB — BASIC METABOLIC PANEL
Anion gap: 10 (ref 5–15)
Anion gap: 9 (ref 5–15)
BUN: 77 mg/dL — ABNORMAL HIGH (ref 8–23)
BUN: 74 mg/dL — ABNORMAL HIGH (ref 8–23)
CO2: 16 mmol/L — ABNORMAL LOW (ref 22–32)
CO2: 20 mmol/L — ABNORMAL LOW (ref 22–32)
Calcium: 8.7 mg/dL — ABNORMAL LOW (ref 8.9–10.3)
Calcium: 8.4 mg/dL — ABNORMAL LOW (ref 8.9–10.3)
Chloride: 109 mmol/L (ref 98–111)
Chloride: 108 mmol/L (ref 98–111)
Creatinine, Ser: 2.92 mg/dL — ABNORMAL HIGH (ref 0.61–1.24)
Creatinine, Ser: 3.01 mg/dL — ABNORMAL HIGH (ref 0.61–1.24)
GFR, Estimated: 22 mL/min — ABNORMAL LOW (ref >60)
GFR, Estimated: 21 mL/min — ABNORMAL LOW (ref >60)
Glucose, Bld: 149 mg/dL — ABNORMAL HIGH (ref 70–99)
Glucose, Bld: 192 mg/dL — ABNORMAL HIGH (ref 70–99)
Potassium: 6.4 mmol/L (ref 3.5–5.1)
Potassium: 5.8 mmol/L — ABNORMAL HIGH (ref 3.5–5.1)
Sodium: 135 mmol/L (ref 135–145)
Sodium: 137 mmol/L (ref 135–145)

## 2021-02-23 LAB — URINALYSIS, ROUTINE W REFLEX MICROSCOPIC
Bilirubin Urine: NEGATIVE
Glucose, UA: NEGATIVE mg/dL
Hgb urine dipstick: NEGATIVE
Ketones, ur: NEGATIVE mg/dL
Leukocytes,Ua: NEGATIVE
Nitrite: NEGATIVE
Protein, ur: NEGATIVE mg/dL
Specific Gravity, Urine: 1.01 (ref 1.005–1.030)
pH: 5 (ref 5.0–8.0)

## 2021-02-23 LAB — GLUCOSE, CAPILLARY: Glucose-Capillary: 185 mg/dL — ABNORMAL HIGH (ref 70–99)

## 2021-02-23 LAB — CBC
HCT: 40 % (ref 39.0–52.0)
Hemoglobin: 12.9 g/dL — ABNORMAL LOW (ref 13.0–17.0)
MCH: 29.4 pg (ref 26.0–34.0)
MCHC: 32.3 g/dL (ref 30.0–36.0)
MCV: 91.1 fL (ref 80.0–100.0)
Platelets: 327 10*3/uL (ref 150–400)
RBC: 4.39 MIL/uL (ref 4.22–5.81)
RDW: 12.8 % (ref 11.5–15.5)
WBC: 12.7 10*3/uL — ABNORMAL HIGH (ref 4.0–10.5)
nRBC: 0 % (ref 0.0–0.2)

## 2021-02-23 LAB — DIFFERENTIAL
Abs Immature Granulocytes: 0.07 10*3/uL (ref 0.00–0.07)
Basophils Absolute: 0.1 10*3/uL (ref 0.0–0.1)
Basophils Relative: 1 %
Eosinophils Absolute: 0.1 10*3/uL (ref 0.0–0.5)
Eosinophils Relative: 1 %
Immature Granulocytes: 1 %
Lymphocytes Relative: 8 %
Lymphs Abs: 0.8 10*3/uL (ref 0.7–4.0)
Monocytes Absolute: 1.2 10*3/uL — ABNORMAL HIGH (ref 0.1–1.0)
Monocytes Relative: 12 %
Neutro Abs: 7.9 10*3/uL — ABNORMAL HIGH (ref 1.7–7.7)
Neutrophils Relative %: 77 %

## 2021-02-23 LAB — RESP PANEL BY RT-PCR (FLU A&B, COVID) ARPGX2
Influenza A by PCR: NEGATIVE
Influenza B by PCR: NEGATIVE
SARS Coronavirus 2 by RT PCR: NEGATIVE

## 2021-02-23 LAB — LACTIC ACID, PLASMA: Lactic Acid, Venous: 1 mmol/L (ref 0.5–1.9)

## 2021-02-23 LAB — HEMOGLOBIN A1C
Hgb A1c MFr Bld: 7.6 % — ABNORMAL HIGH (ref 4.8–5.6)
Mean Plasma Glucose: 171.42 mg/dL

## 2021-02-23 LAB — MAGNESIUM: Magnesium: 2.6 mg/dL — ABNORMAL HIGH (ref 1.7–2.4)

## 2021-02-23 LAB — BRAIN NATRIURETIC PEPTIDE: B Natriuretic Peptide: 32.7 pg/mL (ref 0.0–100.0)

## 2021-02-23 LAB — CBG MONITORING, ED: Glucose-Capillary: 143 mg/dL — ABNORMAL HIGH (ref 70–99)

## 2021-02-23 LAB — CK: Total CK: 470 U/L — ABNORMAL HIGH (ref 49–397)

## 2021-02-23 LAB — C-REACTIVE PROTEIN: CRP: 3.1 mg/dL — ABNORMAL HIGH (ref <1.0)

## 2021-02-23 MED ORDER — DEXTROSE 50 % IV SOLN
1.0000 | Freq: Once | INTRAVENOUS | Status: AC
  Administered 2021-02-23: 50 mL via INTRAVENOUS
  Filled 2021-02-23: qty 50

## 2021-02-23 MED ORDER — PRAMIPEXOLE DIHYDROCHLORIDE 1.5 MG PO TABS
1.5000 mg | ORAL_TABLET | Freq: Every day | ORAL | Status: DC
  Administered 2021-02-23 – 2021-02-27 (×5): 1.5 mg via ORAL
  Filled 2021-02-23 (×6): qty 1

## 2021-02-23 MED ORDER — FUROSEMIDE 10 MG/ML IJ SOLN
40.0000 mg | Freq: Once | INTRAMUSCULAR | Status: AC
  Administered 2021-02-23: 40 mg via INTRAVENOUS
  Filled 2021-02-23: qty 4

## 2021-02-23 MED ORDER — SODIUM CHLORIDE 0.9% FLUSH
3.0000 mL | Freq: Two times a day (BID) | INTRAVENOUS | Status: DC
  Administered 2021-02-23 – 2021-02-28 (×10): 3 mL via INTRAVENOUS

## 2021-02-23 MED ORDER — INSULIN ASPART 100 UNIT/ML IV SOLN
5.0000 [IU] | Freq: Once | INTRAVENOUS | Status: AC
  Administered 2021-02-23: 5 [IU] via INTRAVENOUS

## 2021-02-23 MED ORDER — VANCOMYCIN VARIABLE DOSE PER UNSTABLE RENAL FUNCTION (PHARMACIST DOSING): Status: DC

## 2021-02-23 MED ORDER — ATORVASTATIN CALCIUM 80 MG PO TABS
80.0000 mg | ORAL_TABLET | Freq: Every day | ORAL | Status: DC
Start: 2021-02-23 — End: 2021-02-28
  Administered 2021-02-23 – 2021-02-28 (×6): 80 mg via ORAL
  Filled 2021-02-23 (×6): qty 1

## 2021-02-23 MED ORDER — CLOPIDOGREL BISULFATE 75 MG PO TABS
75.0000 mg | ORAL_TABLET | Freq: Every day | ORAL | Status: DC
  Administered 2021-02-23 – 2021-02-28 (×6): 75 mg via ORAL
  Filled 2021-02-23 (×6): qty 1

## 2021-02-23 MED ORDER — HEPARIN SODIUM (PORCINE) 5000 UNIT/ML IJ SOLN
5000.0000 [IU] | Freq: Three times a day (TID) | INTRAMUSCULAR | Status: DC
  Administered 2021-02-23 – 2021-02-27 (×15): 5000 [IU] via SUBCUTANEOUS
  Filled 2021-02-23 (×14): qty 1

## 2021-02-23 MED ORDER — PIPERACILLIN-TAZOBACTAM 3.375 G IVPB 30 MIN
3.3750 g | Freq: Once | INTRAVENOUS | Status: AC
  Administered 2021-02-23: 3.375 g via INTRAVENOUS
  Filled 2021-02-23: qty 50

## 2021-02-23 MED ORDER — INSULIN GLARGINE-YFGN 100 UNIT/ML ~~LOC~~ SOLN
10.0000 [IU] | Freq: Every day | SUBCUTANEOUS | Status: DC
  Administered 2021-02-23 – 2021-02-27 (×5): 10 [IU] via SUBCUTANEOUS
  Filled 2021-02-23 (×7): qty 0.1

## 2021-02-23 MED ORDER — VANCOMYCIN HCL 2000 MG/400ML IV SOLN
2000.0000 mg | Freq: Once | INTRAVENOUS | Status: AC
Start: 2021-02-23 — End: 2021-02-23
  Administered 2021-02-23: 2000 mg via INTRAVENOUS
  Filled 2021-02-23: qty 400

## 2021-02-23 MED ORDER — FUROSEMIDE 10 MG/ML IJ SOLN
40.0000 mg | Freq: Two times a day (BID) | INTRAMUSCULAR | Status: DC
  Administered 2021-02-23 – 2021-02-27 (×8): 40 mg via INTRAVENOUS
  Filled 2021-02-23 (×7): qty 4

## 2021-02-23 MED ORDER — INSULIN ASPART 100 UNIT/ML IJ SOLN
0.0000 [IU] | Freq: Three times a day (TID) | INTRAMUSCULAR | Status: DC
  Administered 2021-02-24 – 2021-02-25 (×2): 2 [IU] via SUBCUTANEOUS
  Administered 2021-02-25 – 2021-02-26 (×2): 3 [IU] via SUBCUTANEOUS
  Administered 2021-02-27 – 2021-02-28 (×2): 2 [IU] via SUBCUTANEOUS

## 2021-02-23 MED ORDER — SODIUM ZIRCONIUM CYCLOSILICATE 10 G PO PACK
10.0000 g | PACK | Freq: Once | ORAL | Status: AC
  Administered 2021-02-23: 10 g via ORAL
  Filled 2021-02-23: qty 1

## 2021-02-23 MED ORDER — CALCIUM GLUCONATE-NACL 1-0.675 GM/50ML-% IV SOLN
1.0000 g | Freq: Once | INTRAVENOUS | Status: AC
  Administered 2021-02-23: 1000 mg via INTRAVENOUS
  Filled 2021-02-23: qty 50

## 2021-02-23 MED ORDER — PIPERACILLIN-TAZOBACTAM 3.375 G IVPB
3.3750 g | Freq: Three times a day (TID) | INTRAVENOUS | Status: DC
  Administered 2021-02-23 – 2021-02-24 (×2): 3.375 g via INTRAVENOUS
  Filled 2021-02-23 (×3): qty 50

## 2021-02-23 MED ORDER — PRAMIPEXOLE DIHYDROCHLORIDE 0.25 MG PO TABS
0.7500 mg | ORAL_TABLET | Freq: Two times a day (BID) | ORAL | Status: DC
  Filled 2021-02-23: qty 3

## 2021-02-23 NOTE — ED Provider Notes (Signed)
[facility]   CSN: [id number] Arrival date & time: 02/23/21  0220     History  Chief Complaint  Patient presents with   Abnormal Labs    James [name] is a 76 y.o. male presenting for evaluation of leg swelling and pain and abnormal labs.  Patient states that the past 2 weeks, he has had gradually worsening leg swelling.  Today he developed drainage from his legs, was told to come to the ER due to abnormal labs.  He states about 6 months ago, his doctor cut his Lasix dose in half due to frequent urination.  He denies recent fever, cough, chest pain, shortness of breath, nausea, vomiting, abnormal bowel movements.  He does have decreased urination from when he was on the full dose of Lasix, but states he is otherwise urinating normally.  No other medication changes.  He has had hyperkalemia before, but is not sure if he is on potassium supplementation.  HPI     Home Medications Prior to Admission medications   Medication Sig Start Date End Date Taking? Authorizing Provider  nitroGLYCERIN (NITROSTAT) 0.4 MG SL tablet Place 1 tablet (0.4 mg total) under the tongue every 5 (five) minutes as needed for chest pain. 10/01/17 09/08/28 Yes Kroeger, Ovidio KinKrista M., PA-C  acetaminophen (TYLENOL) 500 MG tablet Take 1,000 mg by mouth every 6 (six) hours as needed for headache.    [provider]  Alcohol Swabs (SM ALCOHOL PREP) 70 % PADS 1 each by Other route as needed. 11/02/19   [provider]  atorvastatin (LIPITOR) 80 MG tablet Take 1 tablet (80 mg total) by mouth daily. Patient not taking: Reported on 11/21/2019 09/14/19 10/14/19  Briant CedarEzenduka, Nkeiruka J, MD  atorvastatin (LIPITOR) 80 MG tablet TAKE ONE TABLET BY MOUTH ONCE DAILY 11/30/19 11/29/20  Tera HelperBreedlove, Brent C, PA-C  B-D ULTRAFINE III SHORT PEN 31G X 8 MM MISC Inject 1 each into the skin as directed. 11/02/19   [provider]  cholecalciferol (VITAMIN D) 1000 units  tablet Take 1,000 Units by mouth daily.    [provider]  Continuous Blood Gluc Sensor (FREESTYLE LIBRE 2 SENSOR) MISC USE TO CHECK BLOOD SUGAR AND CHANGE EVERY 14 DAYS 03/13/20 03/13/21  Marlene LardAverneni, Madhavi, MD  ezetimibe (ZETIA) 10 MG tablet TAKE 1 TABLET (10 MG TOTAL) BY MOUTH DAILY. PLEASE MAKE OVERDUE APPT WITH DR. Mayford KnifeURNER BEFORE ANYMORE REFILLS. Patient not taking: Reported on 11/21/2019 09/08/19   Quintella Reicherturner, Traci R, MD  fluticasone [facility](FLONASE) 50 MCG/ACT nasal spray Place 1 spray into both nostrils daily as needed for allergies.     [provider]  insulin aspart (NOVOLOG) 100 UNIT/ML FlexPen INJECT 8 UNITS PLUS CORRECTIONAL DOSE UNDER THE SKIN THREE TIMES DAILY - UP TO 50 UNITS DAILY. 01/09/20 01/08/21  Marlene LardAverneni, Madhavi, MD  insulin aspart (NOVOLOG) 100 UNIT/ML injection INJECT 8 UNITS SUBCUTANEOUSLY THREE TIMES DAILY BEFORE MEALS 11/30/19 11/29/20  Tera HelperBreedlove, Brent C, PA-C  insulin glargine, 1 Unit Dial, (TOUJEO) 300 UNIT/ML Solostar Pen INJECT 22 UNITS SUBCUTANEOUSLY ONCE DAILY Patient taking differently: Inject 21 Units into the skin daily. 11/30/19 02/23/21  Sheliah HatchBreedlove, Brent C, PA-C  Insulin Pen Needle 31G X 8 MM MISC Use as directed under the skin two times daily 06/08/20     iron polysaccharides (NIFEREX) 150 MG capsule Take 1 capsule (150 mg total) by mouth daily. 07/30/17   Lenox PondsSilva Zapata, Edwin, MD  isosorbide mononitrate (IMDUR) 60 MG 24 hr tablet TAKE 1 TABLET (60  MG TOTAL) BY MOUTH DAILY. Patient taking differently: Take 60 mg by mouth daily. 09/08/19 02/23/21  Quintella Reichert, MD  lisinopril (ZESTRIL) 20 MG tablet TAKE 1 TABLET BY MOUTH ONCE DAILY 06/27/19 06/26/20  Masneri, Carollee Herter M, DO  metFORMIN (GLUCOPHAGE-XR) 500 MG 24 hr tablet TAKE 1 TABLET BY MOUTH 2 TIMES DAILY WITH FOOD Patient not taking: Reported on 02/23/2021 03/01/20 03/01/21  Sheliah Hatch, PA-C  metFORMIN (GLUCOPHAGE-XR) 500 MG 24 hr tablet TAKE 1 TABLET BY MOUTH TWICE DAILY 11/21/19 11/20/20  Marlene Lard,  MD  metoprolol succinate (TOPROL-XL) 50 MG 24 hr tablet Take 1 tablet (50 mg total) by mouth daily. Take with or immediately following a meal. Patient not taking: Reported on 11/21/2019 09/14/19 10/14/19  Briant Cedar, MD  metoprolol succinate (TOPROL-XL) 50 MG 24 hr tablet TAKE ONE TABLET BY MOUTH ONCE DAILY 11/30/19 11/29/20  Sheliah Hatch, PA-C  Multiple Vitamin (MULTIVITAMIN WITH MINERALS) TABS tablet Take 1 tablet by mouth daily.    [provider]  Omega-3 1000 MG CAPS Take 2,000 mg by mouth daily.    [provider]  polyethylene glycol (MIRALAX) packet Take 17 g by mouth daily. Patient taking differently: Take 17 g by mouth daily as needed for mild constipation.  07/30/17   Lenox Ponds, MD  potassium chloride (KLOR-CON) 10 MEQ tablet TAKE 1 TABLET (10 MEQ TOTAL) BY MOUTH SEE ADMIN INSTRUCTIONS. PLEASE MAKE APT FOR FUTURE REFILLS 1ST ATTEMPT [phone]. Patient not taking: Reported on 11/21/2019 09/08/19   Quintella Reichert, MD  Potassium Chloride ER 20 MEQ TBCR TAKE ONE TABLET BY MOUTH ONCE DAILY 11/30/19 11/29/20  Tera Helper C, PA-C  pramipexole (MIRAPEX) 0.75 MG tablet TAKE ONE TABLET BY MOUTH TWICE DAILY 11/30/19 11/29/20  Tera Helper C, PA-C  Semaglutide,0.25 or 0.5MG /DOS, 2 MG/1.5ML SOPN INJECT 0.5MG  ONCE A WEEK Patient not taking: Reported on 02/23/2021 03/13/20 03/13/21  Marlene Lard, MD  tamsulosin (FLOMAX) 0.4 MG CAPS capsule Take 0.4 mg by mouth daily.    [provider]  torsemide (DEMADEX) 20 MG tablet Take 1 tablet (20 mg total) by mouth daily. 09/13/19   Briant Cedar, MD  torsemide (DEMADEX) 20 MG tablet TAKE ONE-HALF TO ONE TABLET BY MOUTH ONCE DAILY 11/30/19 11/29/20  Sheliah Hatch, PA-C      Allergies    Lisinopril    Review of Systems   Review of Systems  Cardiovascular:  Positive for leg swelling.  Skin:  Positive for color change and wound.  All other systems reviewed and are negative.  Physical  Exam Updated Vital Signs BP 104/67    Pulse 69    Temp 98.1 F (36.7 C) (Oral)    Resp (!) 22    Ht 5\' 9"  (1.753 m)    Wt 108.9 kg    SpO2 97%    BMI 35.45 kg/m  Physical Exam Vitals and nursing note reviewed.  Constitutional:      General: He is not in acute distress.    Appearance: Normal appearance.     Comments: Nontoxic  HENT:     Head: Normocephalic and atraumatic.  Eyes:     Conjunctiva/sclera: Conjunctivae normal.     Pupils: Pupils are equal, round, and reactive to light.  Cardiovascular:     Rate and Rhythm: Normal rate and regular rhythm.     Pulses: Normal pulses.  Pulmonary:     Effort: Pulmonary effort is normal. No respiratory distress.     Breath  sounds: Normal breath sounds. No wheezing.     Comments: Speaking in full sentences.  Clear lung sounds in all fields. Abdominal:     General: There is no distension.     Palpations: Abdomen is soft. There is no mass.     Tenderness: There is no abdominal tenderness. There is no guarding or rebound.  Musculoskeletal:        General: Normal range of motion.     Cervical back: Normal range of motion and neck supple.     Right lower leg: Edema present.     Left lower leg: Edema present.     Comments: Bilateral leg swelling with chronic skin changes.  However there are also several ulcerations with foul-smelling purulent drainage bilaterally.  Skin:    General: Skin is warm and dry.     Capillary Refill: Capillary refill takes less than 2 seconds.  Neurological:     Mental Status: He is alert and oriented to person, place, and time.  Psychiatric:        Mood and Affect: Mood and affect normal.        Speech: Speech normal.        Behavior: Behavior normal.    ED Results / Procedures / Treatments   Labs (all labs ordered are listed, but only abnormal results are displayed) Labs Reviewed  COMPREHENSIVE METABOLIC PANEL - Abnormal; Notable for the following components:      Result Value   Potassium 6.5 (*)    CO2 19  (*)    Glucose, Bld 139 (*)    BUN 79 (*)    Creatinine, Ser 3.14 (*)    Calcium 8.7 (*)    GFR, Estimated 20 (*)    All other components within normal limits  CBC - Abnormal; Notable for the following components:   WBC 12.7 (*)    Hemoglobin 12.9 (*)    All other components within normal limits  MAGNESIUM - Abnormal; Notable for the following components:   Magnesium 2.6 (*)    All other components within normal limits  CBG MONITORING, ED - Abnormal; Notable for the following components:   Glucose-Capillary 143 (*)    All other components within normal limits  RESP PANEL BY RT-PCR (FLU A&B, COVID) ARPGX2  CULTURE, BLOOD (ROUTINE X 2)  CULTURE, BLOOD (ROUTINE X 2)  URINALYSIS, ROUTINE W REFLEX MICROSCOPIC  DIFFERENTIAL  LACTIC ACID, PLASMA  LACTIC ACID, PLASMA    EKG None  Radiology DG CHEST PORT 1 VIEW  Result Date: 02/23/2021 CLINICAL DATA:  Shortness of breath and edema. EXAM: PORTABLE CHEST 1 VIEW COMPARISON:  09/09/2019 FINDINGS: 0700 hours. The cardio pericardial silhouette is enlarged. There is pulmonary vascular congestion without overt pulmonary edema. Probable persistent retrocardiac collapse/consolidation and left pleural effusion not excluded. Parahilar subsegmental atelectasis noted left lung, as before. Right lung clear. The visualized bony structures of the thorax show no acute abnormality. IMPRESSION: 1. Cardiomegaly with probable retrocardiac left base collapse/consolidation. Small left effusion cannot be excluded. 2. Pulmonary vascular congestion without overt pulmonary edema. Electronically Signed   By: Kennith CenterEric  Mansell M.D.   On: 02/23/2021 07:17    Procedures .Critical Care Performed by: Alveria Apleyaccavale, Ibrohim Simmers, PA-C Authorized by: Alveria Apleyaccavale, Deshun Sedivy, PA-C   Critical care provider statement:    Critical care time (minutes):  40   Critical care time was exclusive of:  Separately billable procedures and treating other patients and teaching time   Critical care was  necessary to treat or prevent imminent or  life-threatening deterioration of the following conditions:  Metabolic crisis   Critical care was time spent personally by me on the following activities:  Blood draw for specimens, development of treatment plan with patient or surrogate, evaluation of patient's response to treatment, examination of patient, obtaining history from patient or surrogate, ordering and performing treatments and interventions, ordering and review of laboratory studies, re-evaluation of patient's condition, ordering and review of radiographic studies, pulse oximetry and review of old charts   I assumed direction of critical care for this patient from another provider in my specialty: no     Care discussed with: admitting provider      Medications Ordered in ED Medications  calcium gluconate 1 g/ 50 mL sodium chloride IVPB (1,000 mg Intravenous New Bag/Given 02/23/21 0756)  piperacillin-tazobactam (ZOSYN) IVPB 3.375 g (3.375 g Intravenous New Bag/Given 02/23/21 0807)  vancomycin (VANCOREADY) IVPB 2000 mg/400 mL (has no administration in time range)  vancomycin variable dose per unstable renal function (pharmacist dosing) (has no administration in time range)  piperacillin-tazobactam (ZOSYN) IVPB 3.375 g (has no administration in time range)  insulin aspart (novoLOG) injection 5 Units (5 Units Intravenous Given 02/23/21 0756)    And  dextrose 50 % solution 50 mL (50 mLs Intravenous Given 02/23/21 0800)  furosemide (LASIX) injection 40 mg (40 mg Intravenous Given 02/23/21 0804)    ED Course/ Medical Decision Making/ A&P                           Medical Decision Making   This patient presents to the ED for concern of leg swelling and abnormal labs. This involves a number of treatment options, and is a complaint that carries with it a high risk of complications and morbidity.  The differential diagnosis includes acute on chronic chf, hyperkalemia, acute on chronic kidney disease.   Co  morbidities:  HTN, CAD, DM, CHF, CKD   Lab Tests:  I ordered, and personally interpreted labs.  The pertinent results include: Critically elevated potassium at 6.5.  Additionally, patient with an AKI, likely indicating hyperkalemia is a true number and not hemolysis.  We will add on infectious work-up including lactic and blood cultures.   Imaging Studies:  I ordered imaging studies including chest x-ray I independently visualized and interpreted imaging which showed cardiomegaly I agree with the radiologist interpretation   Cardiac Monitoring:  The patient was maintained on a cardiac monitor.  I personally viewed and interpreted the cardiac monitored which showed an underlying rhythm of: nsr   Medicines ordered:  I ordered medication including insulin and dextrose for hyperkalemia, calcium for hyperglycemia, Lasix for heart failure and hyperkalemia, and antibiotics for skin infection.  Reevaluation of the patient after these medicines showed that the patient stayed the same   Dispostion:  After consideration of the diagnostic results and the patients response to treatment, I feel that the patent would benefit from admission for management of his AKI, hyperkalemia, and acute on chronic heart failure.  Additionally, patient can have his wounds monitored and managed.  Discussed with Dr. Katrinka Blazing from [facility] hospitalist service, patient to be admitted.  Discussed with patient's daughter, [name].  Updated her regarding plan for admission.  Final Clinical Impression(s) / ED Diagnoses Final diagnoses:  Edema  AKI (acute kidney injury) (HCC)  Hyperkalemia  Cellulitis of lower extremity, unspecified laterality  Acute on chronic heart failure, unspecified heart failure type (HCC)    Rx / DC Orders ED Discharge Orders  None         Alveria Apley, PA-C 02/23/21 1610    Derwood Kaplan, MD 02/23/21 1017

## 2021-02-23 NOTE — Progress Notes (Signed)
Pharmacy Antibiotic Note  James [name] is a 76 y.o. male admitted on 02/23/2021 with cellulitis.  Pharmacy has been consulted for Zosyn and vancomycin dosing.   WBC 12.7, SCr 3.14  (BL ~1.3-1.5)  Plan: -Zosyn 3.375 gm IV Q 8 hours (EI infusion) -Vancomycin 2 gm IV load followed by vancomycin dosing per levels -Monitor CBC, renal fx, cultures and clinical progress  Height: 5\' 9"  (175.3 cm) Weight: 108.9 kg (240 lb 1.3 oz) IBW/kg (Calculated) : 70.7  Temp (24hrs), Avg:98.4 F (36.9 C), Min:98.1 F (36.7 C), Max:98.7 F (37.1 C)  Recent Labs  Lab 02/23/21 0303  WBC 12.7*  CREATININE 3.14*    Estimated Creatinine Clearance: 24.7 mL/min (A) (by C-G formula based on SCr of 3.14 mg/dL (H)).    Allergies  Allergen Reactions   Lisinopril Cough    Patient can take lisinopril now    Antimicrobials this admission: Zosyn 1/7 >>  Vancomycin 1/7 >>   Dose adjustments this admission:  Microbiology results: 1/7 BCx:    Thank you for allowing pharmacy to be a part of this patients care.  3/7, PharmD., BCPS, BCCCP Clinical Pharmacist Please refer to [facility] for unit-specific pharmacist

## 2021-02-23 NOTE — ED Notes (Signed)
James [name] the daughter would like an update once pt is in the room. Her number is  [phone]

## 2021-02-23 NOTE — H&P (Addendum)
History and Physical    James [name] [id number] DOB: 09-03-1945 DOA: 02/23/2021  Referring MD/NP/PA: Franchot Heidelberg, PA-C PCP: Lyman Bishop, DO  Patient coming from: home(lives alone)  Chief Complaint: Legs weeping  I have personally briefly reviewed patient's old medical records in [place]   HPI: James [name] is a 76 y.o. male with medical history significant of hypertension, hyperlipidemia, systolic congestive heart failure, CAD, CVA, AAA, and CKD presents with complaints of his legs weeping over the last 2 weeks.  He states that they have been bubbling and then popping leaking yellowish and sometimes bloody fluid.  At baseline he gets around with the use of a cane/walker and his daughter lives close by.  His daughter notes that they have been trying to get him to go get evaluated has his his legs appeared to be looking worse.  He noted that his orders were covered in the drainage from his wounds and noted that there was a foul odor to it.  He denies having any significant fever, chills, shortness of breath, nausea, vomiting, diarrhea, or loss of consciousness. Patient reports that about 6 months ago his PCP at decrease his torsemide from 20 mg daily to 10 mg due to him having urinary frequency and reporting being in the bathroom all day.  Patient's daughter was able to get him to his primary care office yesterday where they recommended him to increase back to torsemide 20 mg daily due to the leg swelling, suspected him to have signs of cellulitis of his lower extremities for which they had prescribed Augmentin, and performed blood work.  After getting back home patient reportedly lost his balance and fell on his bottom sustaining an abrasion to his left arm.  He states that he did not hit his head and called EMS who came to the house.  At the same time his daughter reports that she was called in regards to the lab work that had been done while he was at his primary care office  stating that he needed to come to the hospital for further evaluation.    ED Course: Upon admission patient was noted to be afebrile, pulse 16-22, and all other vital signs maintained.  Labs significant for WBC 12.7, hemoglobin 12.9, potassium 6.5, BUN 79, creatinine 3.14, and lactic acid 1.  Chest x-ray noted cardiomegaly with probable retrocardiac left base collapse/consolidation, small left pleural effusion could not be excluded, and pulmonary vascular congestion without overt pulmonary edema.  Blood cultures have been obtained due to concern for cellulitis. Patient has been treated with Lasix 40 mg IV, calcium gluconate, NovoLog insulin 5 units, dextrose 50% 1 amp, vancomycin, and Zosyn.  Review of Systems  Constitutional:  Negative for fever.  HENT:  Positive for hearing loss.   Respiratory:  Positive for shortness of breath.   Cardiovascular:  Positive for leg swelling. Negative for chest pain.  Gastrointestinal:  Negative for abdominal pain, nausea and vomiting.  Genitourinary:  Negative for dysuria.  Musculoskeletal:  Positive for falls.  Skin:        Positive for skin color change  Neurological:  Positive for sensory change. Negative for loss of consciousness.  Endo/Heme/Allergies:  Bruises/bleeds easily.  Psychiatric/Behavioral:  Negative for substance abuse.   Otherwise a 12 point review of systems was performed and negative except for as noted above in HPI  Past Medical History:  Diagnosis Date   Abnormal EKG 03/09/2013   Abnormal nuclear cardiac imaging test 11/14/2013   Acute  lacunar infarction ([place]) 02/06/2019   Aortic atherosclerosis ([place]) 07/07/2017   Arthritis    "all over" (09/30/2017)   Ascending aortic aneurysm    cMRI (2/15):  Normal LV EF 54%, Mild BAE, Trileaflet Aortic Valve, Upper limits of normal ascending aorta 3.8 cm, Normal aortic arch 2.3 cm with bovine origin of left carotid   Atrial flutter ([facility])    Post-op with no reoccurence   Chronic diastolic heart  failure ([place])    Echo (1/15):  Left ventricle: The cavity size was mildly dilated. Mild LVH. EF 55% to 60%. Wall motion was normal; Gr 1 diastolic dysfunction Left atrium: The atrium was mildly dilated.  Right atrium: The atrium was mildly to moderately dilated.  Aortic arch measures 4.8 cm (moderately dilated); suggest CTA or MRA to better assess.   Chronic systolic CHF (congestive heart failure) ([place]) 07/24/2017   CKD (chronic kidney disease), stage III ([place]) 07/24/2017   Coronary artery disease 10/2011   a. s/p cabg;  b. Myoview (9/15):  ant ischemia, poss TID, EF 43%, high risk >> LHC (9/15):  Dist LM 50, pLAD 90 then 100, pCFX 50, mCFX 75, oRCA 75, dRCA 95, L-LAD patent, S-Dx 100, S-OM1 patent, S-PDA patent, EF 55% >> native Dx small caliber and not well suited for PCI; native RCA amenable to PCI and would restore flow to PLA branches but would jeopardize SVG-RCA  -  Med Rx   Deviated nasal septum    Dilated aortic root ([place])    Diverticulitis of sigmoid colon 07/24/2017   DM (diabetes mellitus), type 2, uncontrolled with complications    onset 0000000   Dyslipidemia    Edema 02/09/2013   GERD (gastroesophageal reflux disease)    Hx of Doppler ultrasound    Carotid US (9/13):  No ICA stenosis   Hypertension    Pleural thickening 07/24/2017    Past Surgical History:  Procedure Laterality Date   CORONARY ARTERY BYPASS GRAFT  10/27/2011   Procedure: CORONARY ARTERY BYPASS GRAFTING (CABG);  Surgeon: Ivin Poot, MD;  Location: [facility];  Service: Open Heart Surgery;  Laterality: N/A;  Coronary Artery Bypass Grafting times four using left internal mammary artery and right greater saphenous vein endoscopically harvested   CORONARY STENT INTERVENTION N/A 09/30/2017   Procedure: CORONARY STENT INTERVENTION;  Surgeon: Jettie Booze, MD;  Location: [facility] CV LAB;  Service: Cardiovascular;  Laterality: N/A;   KNEE ARTHROSCOPY Left 2003   KNEE ARTHROSCOPY Right 2006   LEFT HEART CATHETERIZATION  WITH CORONARY ANGIOGRAM N/A 10/23/2011   Procedure: LEFT HEART CATHETERIZATION WITH CORONARY ANGIOGRAM;  Surgeon: Sueanne Margarita, MD;  Location: [facility] CATH LAB;  Service: Cardiovascular;  Laterality: N/A;   LEFT HEART CATHETERIZATION WITH CORONARY ANGIOGRAM N/A 11/16/2013   Procedure: LEFT HEART CATHETERIZATION WITH CORONARY ANGIOGRAM;  Surgeon: Blane Ohara, MD;  Location: [facility] CATH LAB;  Service: Cardiovascular;  Laterality: N/A;   RIGHT/LEFT HEART CATH AND CORONARY/GRAFT ANGIOGRAPHY N/A 09/30/2017   Procedure: RIGHT/LEFT HEART CATH AND CORONARY/GRAFT ANGIOGRAPHY;  Surgeon: Jettie Booze, MD;  Location: [facility] CV LAB;  Service: Cardiovascular;  Laterality: N/A;     reports that he has never smoked. He has never used smokeless tobacco. He reports that he does not currently use alcohol. He reports that he does not use drugs.  Allergies  Allergen Reactions   Lisinopril Cough    Patient can take lisinopril now    Family History  Problem Relation Age of Onset   Hypertension Mother  Stroke Father 39   Hypertension Father    Hypertension Paternal Grandfather    Heart attack Neg Hx     Prior to Admission medications   Medication Sig Start Date End Date Taking? Authorizing Provider  nitroGLYCERIN (NITROSTAT) 0.4 MG SL tablet Place 1 tablet (0.4 mg total) under the tongue every 5 (five) minutes as needed for chest pain. 10/01/17 09/08/28 Yes Kroeger, Ovidio Kin., PA-C  acetaminophen (TYLENOL) 500 MG tablet Take 1,000 mg by mouth every 6 (six) hours as needed for headache.    [provider]  Alcohol Swabs (SM ALCOHOL PREP) 70 % PADS 1 each by Other route as needed. 11/02/19   [provider]  atorvastatin (LIPITOR) 80 MG tablet Take 1 tablet (80 mg total) by mouth daily. Patient not taking: Reported on 11/21/2019 09/14/19 10/14/19  Briant Cedar, MD  atorvastatin (LIPITOR) 80 MG tablet TAKE ONE TABLET BY MOUTH ONCE DAILY 11/30/19 11/29/20  Tera Helper C, PA-C   B-D ULTRAFINE III SHORT PEN 31G X 8 MM MISC Inject 1 each into the skin as directed. 11/02/19   [provider]  cholecalciferol (VITAMIN D) 1000 units tablet Take 1,000 Units by mouth daily.    [provider]  Continuous Blood Gluc Sensor (FREESTYLE LIBRE 2 SENSOR) MISC USE TO CHECK BLOOD SUGAR AND CHANGE EVERY 14 DAYS 03/13/20 03/13/21  Marlene Lard, MD  ezetimibe (ZETIA) 10 MG tablet TAKE 1 TABLET (10 MG TOTAL) BY MOUTH DAILY. PLEASE MAKE OVERDUE APPT WITH DR. Mayford Knife BEFORE ANYMORE REFILLS. Patient not taking: Reported on 11/21/2019 09/08/19   Quintella Reichert, MD  fluticasone [facility]) 50 MCG/ACT nasal spray Place 1 spray into both nostrils daily as needed for allergies.     [provider]  insulin aspart (NOVOLOG) 100 UNIT/ML FlexPen INJECT 8 UNITS PLUS CORRECTIONAL DOSE UNDER THE SKIN THREE TIMES DAILY - UP TO 50 UNITS DAILY. 01/09/20 01/08/21  Marlene Lard, MD  insulin aspart (NOVOLOG) 100 UNIT/ML injection INJECT 8 UNITS SUBCUTANEOUSLY THREE TIMES DAILY BEFORE MEALS 11/30/19 11/29/20  Tera Helper C, PA-C  insulin glargine, 1 Unit Dial, (TOUJEO) 300 UNIT/ML Solostar Pen INJECT 22 UNITS SUBCUTANEOUSLY ONCE DAILY Patient taking differently: Inject 21 Units into the skin daily. 11/30/19 02/23/21  Sheliah Hatch, PA-C  Insulin Pen Needle 31G X 8 MM MISC Use as directed under the skin two times daily 06/08/20     iron polysaccharides (NIFEREX) 150 MG capsule Take 1 capsule (150 mg total) by mouth daily. 07/30/17   Lenox Ponds, MD  isosorbide mononitrate (IMDUR) 60 MG 24 hr tablet TAKE 1 TABLET (60 MG TOTAL) BY MOUTH DAILY. Patient taking differently: Take 60 mg by mouth daily. 09/08/19 02/23/21  Quintella Reichert, MD  lisinopril (ZESTRIL) 20 MG tablet TAKE 1 TABLET BY MOUTH ONCE DAILY 06/27/19 06/26/20  Masneri, Carollee Herter M, DO  metFORMIN (GLUCOPHAGE-XR) 500 MG 24 hr tablet TAKE 1 TABLET BY MOUTH 2 TIMES DAILY WITH FOOD Patient not taking: Reported on 02/23/2021  03/01/20 03/01/21  Sheliah Hatch, PA-C  metFORMIN (GLUCOPHAGE-XR) 500 MG 24 hr tablet TAKE 1 TABLET BY MOUTH TWICE DAILY 11/21/19 11/20/20  Marlene Lard, MD  metoprolol succinate (TOPROL-XL) 50 MG 24 hr tablet Take 1 tablet (50 mg total) by mouth daily. Take with or immediately following a meal. Patient not taking: Reported on 11/21/2019 09/14/19 10/14/19  Briant Cedar, MD  metoprolol succinate (TOPROL-XL) 50 MG 24 hr tablet TAKE ONE TABLET BY MOUTH ONCE DAILY 11/30/19 11/29/20  Sheliah Hatch,  PA-C  Multiple Vitamin (MULTIVITAMIN WITH MINERALS) TABS tablet Take 1 tablet by mouth daily.    [provider]  Omega-3 1000 MG CAPS Take 2,000 mg by mouth daily.    [provider]  polyethylene glycol (MIRALAX) packet Take 17 g by mouth daily. Patient taking differently: Take 17 g by mouth daily as needed for mild constipation.  07/30/17   Doreatha Lew, MD  potassium chloride (KLOR-CON) 10 MEQ tablet TAKE 1 TABLET (10 MEQ TOTAL) BY MOUTH SEE ADMIN INSTRUCTIONS. PLEASE MAKE APT FOR FUTURE REFILLS 1ST ATTEMPT [phone]. Patient not taking: Reported on 11/21/2019 09/08/19   Sueanne Margarita, MD  Potassium Chloride ER 20 MEQ TBCR TAKE ONE TABLET BY MOUTH ONCE DAILY 11/30/19 11/29/20  Ammie Dalton C, PA-C  pramipexole (MIRAPEX) 0.75 MG tablet TAKE ONE TABLET BY MOUTH TWICE DAILY 11/30/19 11/29/20  Ramiro Harvest, PA-C  Semaglutide,0.25 or 0.5MG /DOS, 2 [id number] SOPN INJECT 0.5MG  ONCE A WEEK Patient not taking: Reported on 02/23/2021 03/13/20 03/13/21  Madelin Rear, MD  tamsulosin (FLOMAX) 0.4 MG CAPS capsule Take 0.4 mg by mouth daily.    [provider]  torsemide (DEMADEX) 20 MG tablet Take 1 tablet (20 mg total) by mouth daily. 09/13/19   Alma Friendly, MD  torsemide (DEMADEX) 20 MG tablet TAKE ONE-HALF TO ONE TABLET BY MOUTH ONCE DAILY 11/30/19 11/29/20  Ramiro Harvest, PA-C    Physical Exam:  Constitutional: Elderly male who appears to  be in no acute distress at this time Vitals:   02/23/21 0457 02/23/21 0712 02/23/21 0730 02/23/21 0739  BP: (!) 114/58 (!) 122/58 (!) 124/59   Pulse: 74 79 77   Resp: (!) 22 16 19    Temp: 98.1 F (36.7 C)     TempSrc: Oral     SpO2: 97% 98% 98% 99%  Weight:      Height:       Eyes: PERRL, lids and conjunctivae normal ENMT: Mucous membranes are moist. Posterior pharynx clear of any exudate or lesions. Hard of hearing. Neck: normal, supple, no masses, no thyromegaly Respiratory: Normal respiratory effort without significant wheezes or rhonchi appreciated.  O2 saturations maintained on room air Cardiovascular: Regular rate and rhythm, no murmurs / rubs / gallops.  At least 2+ pitting bilateral lower extremity edema  . 2+ pedal pulses.  Abdomen: Protuberant abdomen with no significant tenderness to palpation.  Bowel sounds present. Musculoskeletal: no cyanosis. No joint deformity upper and lower extremities.  Skin: Abrasion to the left arm. venous stasis changes of the bilateral lower extremities with some increased erythema and increased warmth appreciated with several ulcerations with foul-smelling purulent drainage  Neurologic: CN 2-12 grossly intact. Strength 5/5 in all 4.  Psychiatric: Normal judgment and insight. Alert and oriented x 3. Normal mood.     Labs on Admission: I have personally reviewed following labs and imaging studies  CBC: Recent Labs  Lab 02/23/21 0303  WBC 12.7*  HGB 12.9*  HCT 40.0  MCV 91.1  PLT [id number]   Basic Metabolic Panel: Recent Labs  Lab 02/23/21 0303  NA 137  K 6.5*  CL 109  CO2 19*  GLUCOSE 139*  BUN 79*  CREATININE 3.14*  CALCIUM 8.7*  MG 2.6*   GFR: Estimated Creatinine Clearance: 24.7 mL/min (A) (by C-G formula based on SCr of 3.14 mg/dL (H)). Liver Function Tests: Recent Labs  Lab 02/23/21 0303  AST 17  ALT 16  ALKPHOS 118  BILITOT 0.3  PROT 7.6  ALBUMIN 3.6   No results for input(s): LIPASE, AMYLASE in the last 168  hours. No results for input(s): AMMONIA in the last 168 hours. Coagulation Profile: No results for input(s): INR, PROTIME in the last 168 hours. Cardiac Enzymes: No results for input(s): CKTOTAL, CKMB, CKMBINDEX, TROPONINI in the last 168 hours. BNP (last 3 results) No results for input(s): PROBNP in the last 8760 hours. HbA1C: No results for input(s): HGBA1C in the last 72 hours. CBG: Recent Labs  Lab 02/23/21 0754  GLUCAP 143*   Lipid Profile: No results for input(s): CHOL, HDL, LDLCALC, TRIG, CHOLHDL, LDLDIRECT in the last 72 hours. Thyroid Function Tests: No results for input(s): TSH, T4TOTAL, FREET4, T3FREE, THYROIDAB in the last 72 hours. Anemia Panel: No results for input(s): VITAMINB12, FOLATE, FERRITIN, TIBC, IRON, RETICCTPCT in the last 72 hours. Urine analysis:    Component Value Date/Time   COLORURINE STRAW (A) 11/22/2019 0151   APPEARANCEUR CLEAR 11/22/2019 0151   LABSPEC 1.010 11/22/2019 0151   PHURINE 7.0 11/22/2019 0151   GLUCOSEU NEGATIVE 11/22/2019 0151   HGBUR SMALL (A) 11/22/2019 0151   BILIRUBINUR NEGATIVE 11/22/2019 0151   KETONESUR NEGATIVE 11/22/2019 0151   PROTEINUR 100 (A) 11/22/2019 0151   UROBILINOGEN 1.0 10/24/2011 1742   NITRITE NEGATIVE 11/22/2019 0151   LEUKOCYTESUR NEGATIVE 11/22/2019 0151   Sepsis Labs: No results found for this or any previous visit (from the past 240 hour(s)).   Radiological Exams on Admission: DG CHEST PORT 1 VIEW  Result Date: 02/23/2021 CLINICAL DATA:  Shortness of breath and edema. EXAM: PORTABLE CHEST 1 VIEW COMPARISON:  09/09/2019 FINDINGS: 0700 hours. The cardio pericardial silhouette is enlarged. There is pulmonary vascular congestion without overt pulmonary edema. Probable persistent retrocardiac collapse/consolidation and left pleural effusion not excluded. Parahilar subsegmental atelectasis noted left lung, as before. Right lung clear. The visualized bony structures of the thorax show no acute abnormality.  IMPRESSION: 1. Cardiomegaly with probable retrocardiac left base collapse/consolidation. Small left effusion cannot be excluded. 2. Pulmonary vascular congestion without overt pulmonary edema. Electronically Signed   By: Misty Stanley M.D.   On: 02/23/2021 07:17    EKG: Independently reviewed.  Sinus rhythm ectopic atrial rhythm at 85 bpm with right axis deviation and signs of T wave peaking.  Assessment/Plan Suspected cellulitis of the lower extremities: Acute.  Patient presents with complaints of worsening lower extremity swelling of the bilateral legs with weeping wounds noted to have purulent drainage concerning for cellulitis.  Patient's PCP had recommended to start on Augmentin, but patient never took the medication prior to being advised to come to the hospital.  Patient had been started on vancomycin and Zosyn due to concern for purulent cellulitis. -Admit to a cardiac telemetry bed -Follow-up blood cultures -Check CRP -Doppler ultrasound of bilateral lower extremity -Continue empiric antibiotics of vancomycin and Zosyn  -Wound care consulted    Hyperkalemia: Acute.  Initial potassium elevated at 6.5.  EKG did note signs of T wave peaking.  Patient had been given calcium gluconate, insulin, dextrose, and furosemide 40 mg IV.  At home patient had been on potassium chloride 20 mEq daily as well as lisinopril. -Recheck potassium level- 6.4 -Lokelma 10 g p.o. -Continue Lasix IV and we will continue to monitor and treat as needed  Heart failure with reduced EF: Possibly acute on chronic.  Patient presents with worsening lower extremity edema.  Last echocardiogram from 12/08/2019 noted EF of 45-50% with grade 1 diastolic dysfunction, mild septal hypokinesis, and mild concentric left ventricular hypertrophy. -  Strict I&Os and daily weights -Add-on BNP -Check echocardiogram -Lasix 40 mg IV twice daily  -Reassess in a.m. and adjust diuresis as needed.  Acute kidney injury superimposed on  chronic kidney disease stage IIIb: Patient presents with creatinine of 3.14 with BUN 79.  Last available creatinine was 1.55 back in 11/2019.  Question if this is secondary to hypoperfusion related heart failure exacerbation. -Check CK -Hold nephrotoxic agents -Continue to monitor kidney function -Formally consult nephrology if needed in a.m. if kidney function does not appear to be improving with diuresis  Essential hypertension: Home blood pressure regimen included lisinopril 20 mg daily. -Hold lisinopril due to hyperkalemia  Metabolic acidosis without elevated anion gap: CO2 noted to be 19 initially with anion gap 9.  Question of secondary to kidney dysfunction. -Continue to monitor  Normocytic anemia: Hemoglobin 12.9 on admission which appears near patient's baseline. -Continue to monitor  Diabetes mellitus type 2: Home regimen appears to include NovoLog 8 units with meals and glargine 21 units daily. -Hypoglycemic protocols -Check hemoglobin A1c -Semglee substitution started at 10 units daily -CBGs before every meal and at bedtime with moderate SSI -Adjust regimen as needed  History of CVA -Continue Plavix and statin  Fall at home  debility: Patient reports having mechanical fall at home.  At baseline he lives alone and gets around with use of a walker/cane. -PT/OT to eval and treat tomorrow -Transitions of care for possible home health/need of rehab  Hyperlipidemia -Continue atorvastatin  Obesity: BMI 35.45 kg/m  *Med reconciliation still pending ordered back meds from what could be obtained from the patient's daughter   DVT prophylaxis: Heparin Code Status: Full Family Communication: Daughter updated at bedside Disposition Plan: To be determined Consults called: None Admission status: Inpatient require more than 2 midnight stay in the setting of acute kidney injury and cellulitis  Norval Morton MD [facility]   If 7PM-7AM, please contact  night-coverage   02/23/2021, 7:55 AM

## 2021-02-23 NOTE — ED Triage Notes (Signed)
The pt has some abnormal lab results  he was told to come to the hospital  both legs bruised and swollen   he has had this for at least 2 weeks

## 2021-02-23 NOTE — Consult Note (Signed)
[facility] Nurse Consult Note: Reason for Consult: Recommendations made following review of the medical record and discussion with care personnel. Photos of wounds taken by Provider and uploaded to EMR are obscured by the presence of adhesive strip bandages. Full and partial thickness areas of skin loss, erythema, edema. Presented to ED due to color change of exudate. Wounds have been present for 2 weeks, since dose of diuretic was reduced (per patient).  With elevation today in the ED, edema is noted to be improving. (Ridges are evident on left.)  Linear excoriations on bilateral LEs are consistent with scratching. Wound type: venous insufficiency, infection, trauma Pressure Injury POA: N/A Measurement:To be obtained by Nursing at bedside and documented on Nursing Flow Sheet Wound bed:red, yellow fibrinous  Drainage (amount, consistency, odor) small to moderate and serous to light yellow Periwound: as noted above, erythematous, edematous. Hemosiderin staining Dressing procedure/placement/frequency: Unna's boot or other compression is not indicated in the short-term as more frequent observation of the LEs is desired. POC to begin today is to wash LEs with soap and water, rinse and pat dry followed by topical care with folded layer of antimicrobial nonadherent (xeroform) gauze topped with ABD pads for absorbency and comfort beneath securement/wrapping.  Securement with Kerlix roll gauze wrap/paper tape and floatation of heels is requested.  Recommend follow up with PCP, or [facility] or referral to outpatient wound care center of the patient's choosing for discharge.  [facility] nursing team will not follow, but will remain available to this patient, the nursing and medical teams.  Please re-consult if needed. Thanks, Maudie Flakes, MSN, RN, [place], Ar[facility] Abbott  Pager# [phone]

## 2021-02-24 ENCOUNTER — Inpatient Hospital Stay (HOSPITAL_COMMUNITY): Payer: Medicare HMO

## 2021-02-24 DIAGNOSIS — M7989 Other specified soft tissue disorders: Secondary | ICD-10-CM | POA: Diagnosis not present

## 2021-02-24 DIAGNOSIS — I5021 Acute systolic (congestive) heart failure: Secondary | ICD-10-CM

## 2021-02-24 LAB — BASIC METABOLIC PANEL
Anion gap: 9 (ref 5–15)
BUN: 73 mg/dL — ABNORMAL HIGH (ref 8–23)
CO2: 19 mmol/L — ABNORMAL LOW (ref 22–32)
Calcium: 8.5 mg/dL — ABNORMAL LOW (ref 8.9–10.3)
Chloride: 108 mmol/L (ref 98–111)
Creatinine, Ser: 3.08 mg/dL — ABNORMAL HIGH (ref 0.61–1.24)
GFR, Estimated: 20 mL/min — ABNORMAL LOW (ref >60)
Glucose, Bld: 112 mg/dL — ABNORMAL HIGH (ref 70–99)
Potassium: 5.6 mmol/L — ABNORMAL HIGH (ref 3.5–5.1)
Sodium: 136 mmol/L (ref 135–145)

## 2021-02-24 LAB — URINALYSIS, ROUTINE W REFLEX MICROSCOPIC
Bilirubin Urine: NEGATIVE
Glucose, UA: NEGATIVE mg/dL
Hgb urine dipstick: NEGATIVE
Ketones, ur: NEGATIVE mg/dL
Leukocytes,Ua: NEGATIVE
Nitrite: NEGATIVE
Protein, ur: NEGATIVE mg/dL
Specific Gravity, Urine: 1.01 (ref 1.005–1.030)
pH: 5.5 (ref 5.0–8.0)

## 2021-02-24 LAB — GLUCOSE, CAPILLARY
Glucose-Capillary: 113 mg/dL — ABNORMAL HIGH (ref 70–99)
Glucose-Capillary: 105 mg/dL — ABNORMAL HIGH (ref 70–99)
Glucose-Capillary: 130 mg/dL — ABNORMAL HIGH (ref 70–99)
Glucose-Capillary: 104 mg/dL — ABNORMAL HIGH (ref 70–99)

## 2021-02-24 LAB — ECHOCARDIOGRAM COMPLETE
AR max vel: 4.3 cm2
AV Area VTI: 3.98 cm2
AV Area mean vel: 3.94 cm2
AV Mean grad: 3 mmHg
AV Peak grad: 5.7 mmHg
Ao pk vel: 1.19 m/s
Area-P 1/2: 5.66 cm2
Height: 69 in
Weight: 3880.1 oz

## 2021-02-24 LAB — CREATININE, URINE, RANDOM: Creatinine, Urine: 45.78 mg/dL

## 2021-02-24 MED ORDER — SODIUM CHLORIDE 0.9 % IV SOLN
2.0000 g | INTRAVENOUS | Status: DC
  Administered 2021-02-24 – 2021-02-28 (×5): 2 g via INTRAVENOUS
  Filled 2021-02-24 (×5): qty 2

## 2021-02-24 MED ORDER — SODIUM ZIRCONIUM CYCLOSILICATE 10 G PO PACK
10.0000 g | PACK | Freq: Once | ORAL | Status: AC
  Administered 2021-02-24: 10 g via ORAL
  Filled 2021-02-24: qty 1

## 2021-02-24 MED ORDER — PERFLUTREN LIPID MICROSPHERE
1.0000 mL | INTRAVENOUS | Status: AC | PRN
  Administered 2021-02-24: 3 mL via INTRAVENOUS
  Filled 2021-02-24: qty 10

## 2021-02-24 MED ORDER — SODIUM ZIRCONIUM CYCLOSILICATE 10 G PO PACK
10.0000 g | PACK | Freq: Two times a day (BID) | ORAL | Status: DC
Start: 2021-02-24 — End: 2021-02-24
  Filled 2021-02-24 (×2): qty 1

## 2021-02-24 MED ORDER — SODIUM ZIRCONIUM CYCLOSILICATE 10 G PO PACK
10.0000 g | PACK | Freq: Two times a day (BID) | ORAL | Status: AC
Start: 2021-02-24 — End: 2021-02-25
  Administered 2021-02-24 – 2021-02-25 (×2): 10 g via ORAL
  Filled 2021-02-24 (×2): qty 1

## 2021-02-24 NOTE — Progress Notes (Signed)
°  Echocardiogram 2D Echocardiogram has been performed.  James [name] 02/24/2021, 9:27 AM

## 2021-02-24 NOTE — Consult Note (Signed)
Reason for Consult: Renal failure Referring Physician:  Dr. Kurtis Bushman  Chief Complaint:  Legs weeping  Assessment/Plan: Acute on CKD3a? Creatinine was in the 1.3-1.6 range in 11/2019 and then next labs are this admission so unclear if his renal disease progressed. We will need to find out if he has any chemistry panels at his PCP [facility] in [place]. Urinalysis is negative with no proteinuria but will check a microscopy as well but am not expecting to find an active sediment. CKD likely secondary to hypertensive arteriosclerosis. - Good response to diuretics and at this point would continue as he is volume overloaded. Acute component suspicious for CRS.  - Agree with renal ultrasound to r/o obstruction; bladder scan showed ~234ml and currently with condom cath which has worked better than the [employer] for this pt. - Renal diet with 1.2L fluid restriction  -Monitor Daily I/Os, Daily weight  -Maintain MAP>65 for optimal renal perfusion.  -Avoid nephrotoxic medications including NSAIDs  Hyperkalemia secondary to the renal dysfunction + potassium replacement + diet (bananas) at home. - Continued response to diuretics will help - Lokelma 10gm PO BID  HFrEF - Echo shows EF 40-45%; on diuretics at this time. Cellulitis DM - per primary HTN - Controlled Anemia - will follow for now, likely has a component of anemia from chronic disease/ inflammatory state. Transfuse as needed, would not given IV iron with possible cellulitis and no ESA indicated.   HPI: [name] is an 76 y.o. male  HTN, HLD, DM, HFrEF, CASHD, CVA, AAA, CKA presenting with legs weeping for the past 2 weeks. He states that bubbles have formed, popped leaking yellowish and blood fluid. He has noted worsening odor to his wounds but denies fever, chills, nausea, vomiting or shortness of breath. He used to be on torsemide 20mg  before it was decreased to 10mg  6 mths ago because of urinary frequency. Recently his PCP recommended increasing back  to 20mg  daily because of the worsened leg swelling + Augmentin for possible cellulitis. Patient was called with abnormal labs from his PCP + sustained a fall with no head trauma or loss of consciousness. In the ED he was noted to have a BUN/Cr of 79/3.14,potassium of 6.5. CXR showed cardiomegaly with possible retrocardiac consolidation and vascular congestion. He is on Metformin, Klorcon 42meq, Potassium chloride ER 85meq. He is not the best historian and his daughter who doesn't live with him checks in on him daily. He denies any NSAIDs and only uses Tylenol and another medication that is prescribed for him for pain.   ROS Pertinent items are noted in HPI.  Chemistry and CBC: Creat  Date/Time Value Ref Range Status  11/07/2015 08:24 AM 1.20 (H) 0.70 - 1.18 mg/dL Final    Comment:      For patients > or = 76 years of age: The upper reference limit for Creatinine is approximately 13% higher for people identified as African-American.     11/28/2014 09:28 AM 1.13 0.70 - 1.25 mg/dL Final   Creatinine, Ser  Date/Time Value Ref Range Status  02/24/2021 03:15 AM 3.08 (H) 0.61 - 1.24 mg/dL Final  02/23/2021 08:12 PM 3.01 (H) 0.61 - 1.24 mg/dL Final  02/23/2021 09:42 AM 2.92 (H) 0.61 - 1.24 mg/dL Final  02/23/2021 03:03 AM 3.14 (H) 0.61 - 1.24 mg/dL Final  11/23/2019 04:14 AM 1.55 (H) 0.61 - 1.24 mg/dL Final  11/21/2019 02:50 PM 1.30 (H) 0.61 - 1.24 mg/dL Final  11/21/2019 02:40 PM 1.29 (H) 0.61 - 1.24 mg/dL Final  09/13/2019 04:46 AM 1.79 (H) 0.61 - 1.24 mg/dL Final  09/12/2019 06:05 AM 1.91 (H) 0.61 - 1.24 mg/dL Final  09/11/2019 06:17 AM 1.60 (H) 0.61 - 1.24 mg/dL Final  09/10/2019 07:34 AM 1.69 (H) 0.61 - 1.24 mg/dL Final  09/09/2019 07:28 AM 1.48 (H) 0.61 - 1.24 mg/dL Final  09/09/2019 01:26 AM 1.51 (H) 0.61 - 1.24 mg/dL Final  02/06/2019 12:43 AM 1.20 0.61 - 1.24 mg/dL Final  02/05/2019 11:10 PM 1.37 (H) 0.61 - 1.24 mg/dL Final  12/20/2018 04:25 PM 1.34 (H) 0.61 - 1.24 mg/dL Final   01/05/2018 11:38 AM 1.40 (H) 0.76 - 1.27 mg/dL Final  12/24/2017 09:56 AM 1.16 0.76 - 1.27 mg/dL Final  10/13/2017 09:03 AM 1.11 0.76 - 1.27 mg/dL Final  10/01/2017 03:50 AM 1.53 (H) 0.61 - 1.24 mg/dL Final  09/24/2017 10:33 AM 1.04 0.76 - 1.27 mg/dL Final  07/30/2017 04:05 AM 1.01 0.61 - 1.24 mg/dL Final  07/29/2017 04:05 AM 1.12 0.61 - 1.24 mg/dL Final  07/28/2017 03:52 AM 1.25 (H) 0.61 - 1.24 mg/dL Final  07/27/2017 04:15 AM 1.37 (H) 0.61 - 1.24 mg/dL Final  07/26/2017 03:48 AM 1.48 (H) 0.61 - 1.24 mg/dL Final  07/25/2017 03:58 AM 1.33 (H) 0.61 - 1.24 mg/dL Final  07/24/2017 10:18 AM 1.34 (H) 0.61 - 1.24 mg/dL Final  07/22/2017 10:31 AM 1.29 (H) 0.76 - 1.27 mg/dL Final  07/07/2017 09:28 AM 1.49 (H) 0.76 - 1.27 mg/dL Final  06/26/2017 03:44 AM 1.53 (H) 0.61 - 1.24 mg/dL Final  06/25/2017 07:08 AM 1.31 (H) 0.61 - 1.24 mg/dL Final  06/24/2017 03:59 AM 1.35 (H) 0.61 - 1.24 mg/dL Final  06/23/2017 07:24 AM 1.52 (H) 0.61 - 1.24 mg/dL Final  06/22/2017 05:25 AM 1.53 (H) 0.61 - 1.24 mg/dL Final  06/21/2017 05:21 AM 2.01 (H) 0.61 - 1.24 mg/dL Final  06/20/2017 05:09 PM 1.72 (H) 0.61 - 1.24 mg/dL Final  06/20/2017 12:08 PM 2.11 (H) 0.61 - 1.24 mg/dL Final  06/18/2017 09:51 PM 1.31 (H) 0.61 - 1.24 mg/dL Final  06/18/2017 10:01 AM 1.25 0.76 - 1.27 mg/dL Final  04/24/2014 08:42 AM 1.31 0.40 - 1.50 mg/dL Final  01/09/2014 11:39 AM 1.3 0.4 - 1.5 mg/dL Final  12/16/2013 09:56 AM 1.2 0.4 - 1.5 mg/dL Final  12/08/2013 12:33 PM 1.1 0.4 - 1.5 mg/dL Final  11/14/2013 03:12 PM 1.2 0.4 - 1.5 mg/dL Final  11/01/2013 03:07 PM 1.2 0.4 - 1.5 mg/dL Final  04/20/2013 11:16 AM 1.1 0.4 - 1.5 mg/dL Final  03/09/2013 04:28 PM 1.5 0.4 - 1.5 mg/dL Final  11/03/2011 05:00 AM 1.45 (H) 0.50 - 1.35 mg/dL Final  11/02/2011 08:20 AM 1.63 (H) 0.50 - 1.35 mg/dL Final  11/01/2011 04:20 AM 1.89 (H) 0.50 - 1.35 mg/dL Final  10/31/2011 11:20 AM 2.10 (H) 0.50 - 1.35 mg/dL Final   Recent Labs  Lab 02/23/21 0303  02/23/21 0942 02/23/21 2012 02/24/21 0315  NA 137 135 137 136  K 6.5* 6.4* 5.8* 5.6*  CL 109 109 108 108  CO2 19* 16* 20* 19*  GLUCOSE 139* 149* 192* 112*  BUN 79* 77* 74* 73*  CREATININE 3.14* 2.92* 3.01* 3.08*  CALCIUM 8.7* 8.7* 8.4* 8.5*   Recent Labs  Lab 02/23/21 0303 02/23/21 0744  WBC 12.7*  --   NEUTROABS  --  7.9*  HGB 12.9*  --   HCT 40.0  --   MCV 91.1  --   PLT 327  --    Liver Function Tests: Recent Labs  Lab 02/23/21 0303  AST 17  ALT 16  ALKPHOS 118  BILITOT 0.3  PROT 7.6  ALBUMIN 3.6   No results for input(s): LIPASE, AMYLASE in the last 168 hours. No results for input(s): AMMONIA in the last 168 hours. Cardiac Enzymes: Recent Labs  Lab 02/23/21 2012  CKTOTAL 470*   Iron Studies: No results for input(s): IRON, TIBC, TRANSFERRIN, FERRITIN in the last 72 hours. PT/INR: @LABRCNTIP (inr:5)  Xrays/Other Studies: ) Results for orders placed or performed during the hospital encounter of 02/23/21 (from the past 48 hour(s))  Urinalysis, Routine w reflex microscopic Urine, Clean Catch     Status: None   Collection Time: 02/23/21  2:39 AM  Result Value Ref Range   Color, Urine YELLOW YELLOW   APPearance CLEAR CLEAR   Specific Gravity, Urine 1.010 1.005 - 1.030   pH 5.0 5.0 - 8.0   Glucose, UA NEGATIVE NEGATIVE mg/dL   Hgb urine dipstick NEGATIVE NEGATIVE   Bilirubin Urine NEGATIVE NEGATIVE   Ketones, ur NEGATIVE NEGATIVE mg/dL   Protein, ur NEGATIVE NEGATIVE mg/dL   Nitrite NEGATIVE NEGATIVE   Leukocytes,Ua NEGATIVE NEGATIVE    Comment: Microscopic not done on urines with negative protein, blood, leukocytes, nitrite, or glucose < 500 mg/dL. Performed at [facility], [place] [address], [place], [place] [address]   Comprehensive metabolic panel     Status: Abnormal   Collection Time: 02/23/21  3:03 AM  Result Value Ref Range   Sodium 137 135 - 145 mmol/L   Potassium 6.5 (HH) 3.5 - 5.1 mmol/L    Comment: NO VISIBLE HEMOLYSIS CRITICAL  RESULT CALLED TO, READ BACK BY AND VERIFIED WITH: KELLY MOON RN 02/23/21 0428 M KOROLESKI    Chloride 109 98 - 111 mmol/L   CO2 19 (L) 22 - 32 mmol/L   Glucose, Bld 139 (H) 70 - 99 mg/dL    Comment: Glucose reference range applies only to samples taken after fasting for at least 8 hours.   BUN 79 (H) 8 - 23 mg/dL   Creatinine, Ser 3.14 (H) 0.61 - 1.24 mg/dL   Calcium 8.7 (L) 8.9 - 10.3 mg/dL   Total Protein 7.6 6.5 - 8.1 g/dL   Albumin 3.6 3.5 - 5.0 g/dL   AST 17 15 - 41 U/L   ALT 16 0 - 44 U/L   Alkaline Phosphatase 118 38 - 126 U/L   Total Bilirubin 0.3 0.3 - 1.2 mg/dL   GFR, Estimated 20 (L) >60 mL/min    Comment: (NOTE) Calculated using the CKD-EPI Creatinine Equation (2021)    Anion gap 9 5 - 15    Comment: Performed at [facility], [place] [address] [place], [place] [address]  CBC     Status: Abnormal   Collection Time: 02/23/21  3:03 AM  Result Value Ref Range   WBC 12.7 (H) 4.0 - 10.5 K/uL   RBC 4.39 4.22 - 5.81 MIL/uL   Hemoglobin 12.9 (L) 13.0 - 17.0 g/dL   HCT 40.0 39.0 - 52.0 %   MCV 91.1 80.0 - 100.0 fL   MCH 29.4 26.0 - 34.0 pg   MCHC 32.3 30.0 - 36.0 g/dL   RDW 12.8 11.5 - 15.5 %   Platelets 327 150 - 400 K/uL   nRBC 0.0 0.0 - 0.2 %    Comment: Performed at [facility], [place] [address] [place], [place] [address]  Magnesium     Status: Abnormal   Collection Time: 02/23/21  3:03 AM  Result Value  Ref Range   Magnesium 2.6 (H) 1.7 - 2.4 mg/dL    Comment: Performed at [facility] [address], [place], [place] [address]  Resp Panel by RT-PCR (Flu A&B, Covid) Nasopharyngeal Swab     Status: None   Collection Time: 02/23/21  6:41 AM   Specimen: Nasopharyngeal Swab; Nasopharyngeal(NP) swabs in vial transport medium  Result Value Ref Range   SARS Coronavirus 2 by RT PCR NEGATIVE NEGATIVE    Comment: (NOTE) SARS-CoV-2 target nucleic acids are NOT DETECTED.  The SARS-CoV-2 RNA is generally detectable in upper respiratory specimens during the  acute phase of infection. The lowest concentration of SARS-CoV-2 viral copies this assay can detect is 138 copies/mL. A negative result does not preclude SARS-Cov-2 infection and should not be used as the sole basis for treatment or other patient management decisions. A negative result may occur with  improper specimen collection/handling, submission of specimen other than nasopharyngeal swab, presence of viral mutation(s) within the areas targeted by this assay, and inadequate number of viral copies(<138 copies/mL). A negative result must be combined with clinical observations, patient history, and epidemiological information. The expected result is Negative.  Fact Sheet for Patients:  EntrepreneurPulse.com.au  Fact Sheet for Healthcare Providers:  IncredibleEmployment.be  This test is no t yet approved or cleared by the [place] FDA and  has been authorized for detection and/or diagnosis of SARS-CoV-2 by FDA under an Emergency Use Authorization (EUA). This EUA will remain  in effect (meaning this test can be used) for the duration of the COVID-19 declaration under Section 564(b)(1) of the Act, 21 U.S.C.section 360bbb-3(b)(1), unless the authorization is terminated  or revoked sooner.       Influenza A by PCR NEGATIVE NEGATIVE   Influenza B by PCR NEGATIVE NEGATIVE    Comment: (NOTE) The Xpert Xpress SARS-CoV-2/FLU/RSV plus assay is intended as an aid in the diagnosis of influenza from Nasopharyngeal swab specimens and should not be used as a sole basis for treatment. Nasal washings and aspirates are unacceptable for Xpert Xpress SARS-CoV-2/FLU/RSV testing.  Fact Sheet for Patients: EntrepreneurPulse.com.au  Fact Sheet for Healthcare Providers: IncredibleEmployment.be  This test is not yet approved or cleared by the [place] FDA and has been authorized for detection and/or diagnosis of  SARS-CoV-2 by FDA under an Emergency Use Authorization (EUA). This EUA will remain in effect (meaning this test can be used) for the duration of the COVID-19 declaration under Section 564(b)(1) of the Act, 21 U.S.C. section 360bbb-3(b)(1), unless the authorization is terminated or revoked.  Performed at [facility], [place] [address], [place], [place] [address]   Lactic acid, plasma     Status: None   Collection Time: 02/23/21  6:44 AM  Result Value Ref Range   Lactic Acid, Venous 1.0 0.5 - 1.9 mmol/L    Comment: Performed at [facility] [address], [place], [place] [address]  Blood culture (routine x 2)     Status: None (Preliminary result)   Collection Time: 02/23/21  7:33 AM   Specimen: BLOOD RIGHT HAND  Result Value Ref Range   Specimen Description BLOOD RIGHT HAND    Special Requests      BOTTLES DRAWN AEROBIC AND ANAEROBIC Blood Culture results may not be optimal due to an inadequate volume of blood received in culture bottles   Culture      NO GROWTH 1 DAY Performed at [facility], [place] [address] [place] [address] [place], [place] [address]  Report Status PENDING   Differential     Status: Abnormal   Collection Time: 02/23/21  7:44 AM  Result Value Ref Range   Neutrophils Relative % 77 %   Neutro Abs 7.9 (H) 1.7 - 7.7 K/uL   Lymphocytes Relative 8 %   Lymphs Abs 0.8 0.7 - 4.0 K/uL   Monocytes Relative 12 %   Monocytes Absolute 1.2 (H) 0.1 - 1.0 K/uL   Eosinophils Relative 1 %   Eosinophils Absolute 0.1 0.0 - 0.5 K/uL   Basophils Relative 1 %   Basophils Absolute 0.1 0.0 - 0.1 K/uL   Immature Granulocytes 1 %   Abs Immature Granulocytes 0.07 0.00 - 0.07 K/uL    Comment: Performed at [facility] [address], [place], [place] [address]  Blood culture (routine x 2)     Status: None (Preliminary result)   Collection Time: 02/23/21  7:44 AM   Specimen: BLOOD  Result Value Ref Range   Specimen Description BLOOD SITE NOT SPECIFIED    Special Requests       BOTTLES DRAWN AEROBIC AND ANAEROBIC Blood Culture adequate volume   Culture      NO GROWTH 1 DAY Performed at [facility], [place] [address], [place],  [place] [address]    Report Status PENDING   CBG monitoring, ED     Status: Abnormal   Collection Time: 02/23/21  7:54 AM  Result Value Ref Range   Glucose-Capillary 143 (H) 70 - 99 mg/dL    Comment: Glucose reference range applies only to samples taken after fasting for at least 8 hours.  Brain natriuretic peptide     Status: None   Collection Time: 02/23/21  9:41 AM  Result Value Ref Range   B Natriuretic Peptide 32.7 0.0 - 100.0 pg/mL    Comment: Performed at [facility] [address] [place], [place] [address]  C-reactive protein     Status: Abnormal   Collection Time: 02/23/21  9:42 AM  Result Value Ref Range   CRP 3.1 (H) <1.0 mg/dL    Comment: Performed at [facility] [address], [place], [place] [id number]  Basic metabolic panel     Status: Abnormal   Collection Time: 02/23/21  9:42 AM  Result Value Ref Range   Sodium 135 135 - 145 mmol/L   Potassium 6.4 (HH) 3.5 - 5.1 mmol/L    Comment: NO VISIBLE HEMOLYSIS CRITICAL RESULT CALLED TO, READ BACK BY AND VERIFIED WITH: E.DIXON,RN 02/23/2021 1045 DAVISB    Chloride 109 98 - 111 mmol/L   CO2 16 (L) 22 - 32 mmol/L   Glucose, Bld 149 (H) 70 - 99 mg/dL    Comment: Glucose reference range applies only to samples taken after fasting for at least 8 hours.   BUN 77 (H) 8 - 23 mg/dL   Creatinine, Ser 2.92 (H) 0.61 - 1.24 mg/dL   Calcium 8.7 (L) 8.9 - 10.3 mg/dL   GFR, Estimated 22 (L) >60 mL/min    Comment: (NOTE) Calculated using the CKD-EPI Creatinine Equation (2021)    Anion gap 10 5 - 15    Comment: Performed at [facility] [address], [place], [place] [address]  Glucose, capillary     Status: Abnormal   Collection Time: 02/23/21  7:47 PM  Result Value Ref Range   Glucose-Capillary 185 (H) 70 - 99 mg/dL    Comment: Glucose reference  range applies only to samples taken  after fasting for at least 8 hours.  Basic metabolic panel     Status: Abnormal   Collection Time: 02/23/21  8:12 PM  Result Value Ref Range   Sodium 137 135 - 145 mmol/L   Potassium 5.8 (H) 3.5 - 5.1 mmol/L   Chloride 108 98 - 111 mmol/L   CO2 20 (L) 22 - 32 mmol/L   Glucose, Bld 192 (H) 70 - 99 mg/dL    Comment: Glucose reference range applies only to samples taken after fasting for at least 8 hours.   BUN 74 (H) 8 - 23 mg/dL   Creatinine, Ser 3.01 (H) 0.61 - 1.24 mg/dL   Calcium 8.4 (L) 8.9 - 10.3 mg/dL   GFR, Estimated 21 (L) >60 mL/min    Comment: (NOTE) Calculated using the CKD-EPI Creatinine Equation (2021)    Anion gap 9 5 - 15    Comment: Performed at [facility] [address] [place], [place] [address]  Hemoglobin A1c     Status: Abnormal   Collection Time: 02/23/21  8:12 PM  Result Value Ref Range   Hgb A1c MFr Bld 7.6 (H) 4.8 - 5.6 %    Comment: (NOTE) Pre diabetes:          5.7%-6.4%  Diabetes:              >6.4%  Glycemic control for   <7.0% adults with diabetes    Mean Plasma Glucose 171.42 mg/dL    Comment: Performed at [facility] [address], [place], [place] [address]  CK     Status: Abnormal   Collection Time: 02/23/21  8:12 PM  Result Value Ref Range   Total CK 470 (H) 49 - 397 U/L    Comment: Performed at [facility], [place] [address], [place], [place] [id number]  Basic metabolic panel     Status: Abnormal   Collection Time: 02/24/21  3:15 AM  Result Value Ref Range   Sodium 136 135 - 145 mmol/L   Potassium 5.6 (H) 3.5 - 5.1 mmol/L   Chloride 108 98 - 111 mmol/L   CO2 19 (L) 22 - 32 mmol/L   Glucose, Bld 112 (H) 70 - 99 mg/dL    Comment: Glucose reference range applies only to samples taken after fasting for at least 8 hours.   BUN 73 (H) 8 - 23 mg/dL   Creatinine, Ser 3.08 (H) 0.61 - 1.24 mg/dL   Calcium 8.5 (L) 8.9 - 10.3 mg/dL   GFR, Estimated 20 (L) >60 mL/min    Comment:  (NOTE) Calculated using the CKD-EPI Creatinine Equation (2021)    Anion gap 9 5 - 15    Comment: Performed at [facility] [address], [place], [place] [address]  Glucose, capillary     Status: Abnormal   Collection Time: 02/24/21  7:40 AM  Result Value Ref Range   Glucose-Capillary 113 (H) 70 - 99 mg/dL    Comment: Glucose reference range applies only to samples taken after fasting for at least 8 hours.   DG CHEST PORT 1 VIEW  Result Date: 02/23/2021 CLINICAL DATA:  Shortness of breath and edema. EXAM: PORTABLE CHEST 1 VIEW COMPARISON:  09/09/2019 FINDINGS: 0700 hours. The cardio pericardial silhouette is enlarged. There is pulmonary vascular congestion without overt pulmonary edema. Probable persistent retrocardiac collapse/consolidation and left pleural effusion not excluded. Parahilar subsegmental atelectasis noted left lung, as before. Right lung clear. The visualized bony structures of the  thorax show no acute abnormality. IMPRESSION: 1. Cardiomegaly with probable retrocardiac left base collapse/consolidation. Small left effusion cannot be excluded. 2. Pulmonary vascular congestion without overt pulmonary edema. Electronically Signed   By: Misty Stanley M.D.   On: 02/23/2021 07:17   ECHOCARDIOGRAM COMPLETE  Result Date: 02/24/2021    ECHOCARDIOGRAM REPORT   Patient Name:   James [name] Date of Exam: 02/24/2021 Medical Rec #:  [id number]     Height:       69.0 in Accession #:    [id number]    Weight:       242.5 lb Date of Birth:  09-05-1945     BSA:          2.242 m Patient Age:    2 years      BP:           127/64 mmHg Patient Gender: M             HR:           72 bpm. Exam Location:  Inpatient Procedure: 2D Echo, Cardiac Doppler and Color Doppler Indications:    CHF-Acute systolic  History:        Patient has prior history of Echocardiogram examinations. CHF,                 CAD, Prior CABG; Risk Factors:Hypertension, Diabetes and                 Dyslipidemia. AAA.  Sonographer:     Clayton Lefort RDCS (AE) Referring Phys: [id number] RONDELL A SMITH IMPRESSIONS  1. Technically difficult due to poor sound wave transmission; probable mild LV dysfunction with EF 45; consider cardiac MRI to more definitively define LV function if clinicalliy indicated.  2. Left ventricular ejection fraction, by estimation, is 40 to 45%. The left ventricle has mildly decreased function. The left ventricle demonstrates global hypokinesis. Left ventricular diastolic parameters are consistent with Grade II diastolic dysfunction (pseudonormalization). Elevated left atrial pressure.  3. Right ventricular systolic function is normal. The right ventricular size is mildly enlarged.  4. Right atrial size was mildly dilated.  5. The mitral valve is normal in structure. Trivial mitral valve regurgitation. No evidence of mitral stenosis.  6. The aortic valve is tricuspid. Aortic valve regurgitation is not visualized. Aortic valve sclerosis is present, with no evidence of aortic valve stenosis.  7. The inferior vena cava is normal in size with greater than 50% respiratory variability, suggesting right atrial pressure of 3 mmHg. FINDINGS  Left Ventricle: Left ventricular ejection fraction, by estimation, is 40 to 45%. The left ventricle has mildly decreased function. The left ventricle demonstrates global hypokinesis. The left ventricular internal cavity size was normal in size. There is  no left ventricular hypertrophy. Left ventricular diastolic parameters are consistent with Grade II diastolic dysfunction (pseudonormalization). Elevated left atrial pressure. Right Ventricle: The right ventricular size is mildly enlarged. Right ventricular systolic function is normal. Left Atrium: Left atrial size was normal in size. Right Atrium: Right atrial size was mildly dilated. Pericardium: There is no evidence of pericardial effusion. Mitral Valve: The mitral valve is normal in structure. Trivial mitral valve regurgitation. No evidence of  mitral valve stenosis. Tricuspid Valve: The tricuspid valve is normal in structure. Tricuspid valve regurgitation is trivial. No evidence of tricuspid stenosis. Aortic Valve: The aortic valve is tricuspid. Aortic valve regurgitation is not visualized. Aortic valve sclerosis is present, with no evidence of aortic valve stenosis. Aortic valve mean gradient measures 3.0  mmHg. Aortic valve peak gradient measures 5.7  mmHg. Aortic valve area, by VTI measures 3.98 cm. Pulmonic Valve: The pulmonic valve was not well visualized. Pulmonic valve regurgitation is mild. No evidence of pulmonic stenosis. Aorta: The aortic root is normal in size and structure. Venous: The inferior vena cava is normal in size with greater than 50% respiratory variability, suggesting right atrial pressure of 3 mmHg. IAS/Shunts: The interatrial septum was not well visualized. Additional Comments: Technically difficult due to poor sound wave transmission; probable mild LV dysfunction with EF 45; consider cardiac MRI to more definitively define LV function if clinicalliy indicated.  LEFT VENTRICLE PLAX 2D LVOT diam:     2.40 cm   Diastology LV SV:         92        LV e' medial:    5.87 cm/s LV SV Index:   41        LV E/e' medial:  17.2 LVOT Area:     4.52 cm  LV e' lateral:   11.10 cm/s                          LV E/e' lateral: 9.1  RIGHT VENTRICLE             IVC RV Basal diam:  4.20 cm     IVC diam: 1.50 cm RV Mid diam:    3.90 cm RV S prime:     13.10 cm/s TAPSE (M-mode): 1.8 cm LEFT ATRIUM             Index        RIGHT ATRIUM           Index LA Vol (A2C):   49.1 ml 21.90 ml/m  RA Area:     31.30 cm LA Vol (A4C):   67.3 ml 30.01 ml/m  RA Volume:   101.00 ml 45.04 ml/m LA Biplane Vol: 59.3 ml 26.45 ml/m  AORTIC VALVE AV Area (Vmax):    4.30 cm AV Area (Vmean):   3.94 cm AV Area (VTI):     3.98 cm AV Vmax:           119.00 cm/s AV Vmean:          82.400 cm/s AV VTI:            0.232 m AV Peak Grad:      5.7 mmHg AV Mean Grad:      3.0  mmHg LVOT Vmax:         113.00 cm/s LVOT Vmean:        71.800 cm/s LVOT VTI:          0.204 m LVOT/AV VTI ratio: 0.88  AORTA Ao Root diam: 3.40 cm Ao Asc diam:  3.50 cm MITRAL VALVE MV Area (PHT): 5.66 cm     SHUNTS MV Decel Time: 134 msec     Systemic VTI:  0.20 m MV E velocity: 101.00 cm/s  Systemic Diam: 2.40 cm MV A velocity: 66.60 cm/s MV E/A ratio:  1.52 Kirk Ruths MD Electronically signed by Kirk Ruths MD Signature Date/Time: 02/24/2021/10:00:51 AM    Final    VAS [place] LOWER EXTREMITY VENOUS (DVT)  Result Date: 02/24/2021  Lower Venous DVT Study Patient Name:  DMITRIY ZDANOWICZ  Date of Exam:   02/24/2021 Medical Rec #: [id number]      Accession #:    [id number] Date of Birth: 1946/02/10  Patient Gender: M Patient Age:   33 years Exam Location:  [facility] Procedure:      VAS [place] LOWER EXTREMITY VENOUS (DVT) Referring Phys: Fuller Plan --------------------------------------------------------------------------------  Indications: Swelling.  Limitations: Bandages. Comparison       07-25-2017 Bilateral lower extremity venous was negative for Study:           DVT. Performing Technologist: Darlin Coco RDMS, RVT  Examination Guidelines: A complete evaluation includes B-mode imaging, spectral Doppler, color Doppler, and power Doppler as needed of all accessible portions of each vessel. Bilateral testing is considered an integral part of a complete examination. Limited examinations for reoccurring indications may be performed as noted. The reflux portion of the exam is performed with the patient in reverse Trendelenburg.  +---------+---------------+---------+-----------+----------+-------------------+  RIGHT     Compressibility Phasicity Spontaneity Properties Thrombus Aging       +---------+---------------+---------+-----------+----------+-------------------+  CFV       Full            Yes       Yes                                          +---------+---------------+---------+-----------+----------+-------------------+  SFJ       Full                                                                  +---------+---------------+---------+-----------+----------+-------------------+  FV Prox   Full                                                                  +---------+---------------+---------+-----------+----------+-------------------+  FV Mid    Full                                                                  +---------+---------------+---------+-----------+----------+-------------------+  FV Distal Full                                                                  +---------+---------------+---------+-----------+----------+-------------------+  PFV       Full                                                                  +---------+---------------+---------+-----------+----------+-------------------+  POP       Full  Yes       Yes                                         +---------+---------------+---------+-----------+----------+-------------------+  PTV                                                        Unable to visualize                                                              due to bandaging     +---------+---------------+---------+-----------+----------+-------------------+  PERO                                                       Unable to visualize                                                              due to bandaging     +---------+---------------+---------+-----------+----------+-------------------+   +---------+---------------+---------+-----------+----------+-------------------+  LEFT      Compressibility Phasicity Spontaneity Properties Thrombus Aging       +---------+---------------+---------+-----------+----------+-------------------+  CFV       Full            Yes       Yes                                         +---------+---------------+---------+-----------+----------+-------------------+   SFJ       Full                                                                  +---------+---------------+---------+-----------+----------+-------------------+  FV Prox   Full                                                                  +---------+---------------+---------+-----------+----------+-------------------+  FV Mid    Full                                                                  +---------+---------------+---------+-----------+----------+-------------------+  FV Distal Full                                                                  +---------+---------------+---------+-----------+----------+-------------------+  PFV       Full                                                                  +---------+---------------+---------+-----------+----------+-------------------+  POP       Full            Yes       Yes                                         +---------+---------------+---------+-----------+----------+-------------------+  PTV                                                        Unable to visualize                                                              due to bandaging     +---------+---------------+---------+-----------+----------+-------------------+  PERO                                                       Unable to visualize                                                              due to bandaging     +---------+---------------+---------+-----------+----------+-------------------+     Summary: RIGHT: - There is no evidence of deep vein thrombosis in the lower extremity. However, portions of this examination were limited- see technologist comments above.  - No cystic structure found in the popliteal fossa.  LEFT: - There is no evidence of deep vein thrombosis in the lower extremity. However, portions of this examination were limited- see technologist comments above.  - No cystic structure found in the popliteal fossa.  *See table(s) above for measurements and  observations.    Preliminary     PMH:   Past Medical History:  Diagnosis Date   Abnormal EKG 03/09/2013   Abnormal nuclear cardiac imaging test 11/14/2013   Acute lacunar infarction [facility] 02/06/2019   Aortic atherosclerosis ([place]) 07/07/2017  Arthritis    "all over" (09/30/2017)   Ascending aortic aneurysm    cMRI (2/15):  Normal LV EF 54%, Mild BAE, Trileaflet Aortic Valve, Upper limits of normal ascending aorta 3.8 cm, Normal aortic arch 2.3 cm with bovine origin of left carotid   Atrial flutter (H[facility])    Post-op with no reoccurence   Chronic diastolic heart failure ([place])    Echo (1/15):  Left ventricle: The cavity size was mildly dilated. Mild LVH. EF 55% to 60%. Wall motion was normal; Gr 1 diastolic dysfunction Left atrium: The atrium was mildly dilated.  Right atrium: The atrium was mildly to moderately dilated.  Aortic arch measures 4.8 cm (moderately dilated); suggest CTA or MRA to better assess.   Chronic systolic CHF (congestive heart failure) ([place]) 07/24/2017   CKD (chronic kidney disease), stage III ([place]) 07/24/2017   Coronary artery disease 10/2011   a. s/p cabg;  b. Myoview (9/15):  ant ischemia, poss TID, EF 43%, high risk >> LHC (9/15):  Dist LM 50, pLAD 90 then 100, pCFX 50, mCFX 75, oRCA 75, dRCA 95, L-LAD patent, S-Dx 100, S-OM1 patent, S-PDA patent, EF 55% >> native Dx small caliber and not well suited for PCI; native RCA amenable to PCI and would restore flow to PLA branches but would jeopardize SVG-RCA  -  Med Rx   Deviated nasal septum    Dilated aortic root ([place])    Diverticulitis of sigmoid colon 07/24/2017   DM (diabetes mellitus), type 2, uncontrolled with complications    onset 0000000   Dyslipidemia    Edema 02/09/2013   GERD (gastroesophageal reflux disease)    Hx of Doppler ultrasound    Carotid US (9/13):  No ICA stenosis   Hypertension    Pleural thickening 07/24/2017    PSH:   Past Surgical History:  Procedure Laterality Date   CORONARY ARTERY BYPASS GRAFT   10/27/2011   Procedure: CORONARY ARTERY BYPASS GRAFTING (CABG);  Surgeon: Ivin Poot, MD;  Location: [place];  Service: Open Heart Surgery;  Laterality: N/A;  Coronary Artery Bypass Grafting times four using left internal mammary artery and right greater saphenous vein endoscopically harvested   CORONARY STENT INTERVENTION N/A 09/30/2017   Procedure: CORONARY STENT INTERVENTION;  Surgeon: Jettie Booze, MD;  Location: [place] [facility];  Service: Cardiovascular;  Laterality: N/A;   KNEE ARTHROSCOPY Left 2003   KNEE ARTHROSCOPY Right 2006   LEFT HEART CATHETERIZATION WITH CORONARY ANGIOGRAM N/A 10/23/2011   Procedure: LEFT HEART CATHETERIZATION WITH CORONARY ANGIOGRAM;  Surgeon: Sueanne Margarita, MD;  Location: [place] CATH LAB;  Service: Cardiovascular;  Laterality: N/A;   LEFT HEART CATHETERIZATION WITH CORONARY ANGIOGRAM N/A 11/16/2013   Procedure: LEFT HEART CATHETERIZATION WITH CORONARY ANGIOGRAM;  Surgeon: Blane Ohara, MD;  Location: [facility] C[facility];  Service: Cardiovascular;  Laterality: N/A;   RIGHT/LEFT HEART CATH AND CORONARY/GRAFT ANGIOGRAPHY N/A 09/30/2017   Procedure: RIGHT/LEFT HEART CATH AND CORONARY/GRAFT ANGIOGRAPHY;  Surgeon: Jettie Booze, MD;  Location: [place] [facility];  Service: Cardiovascular;  Laterality: N/A;    Allergies: No Active Allergies  Medications:   Prior to Admission medications   Medication Sig Start Date End Date Taking? Authorizing Provider  acetaminophen (TYLENOL) 500 MG tablet Take 1,000 mg by mouth 2 (two) times daily as needed for headache (pain).   Yes [provider]  atorvastatin (LIPITOR) 80 MG tablet TAKE ONE TABLET BY MOUTH ONCE DAILY Patient taking differently: Take 80 mg by mouth daily with breakfast.  11/30/19 05/17/21 Yes Ramiro Harvest, PA-C  clopidogrel (PLAVIX) 75 MG tablet Take 75 mg by mouth daily with breakfast. 01/21/21  Yes [provider]  diclofenac Sodium (VOLTAREN) 1 % GEL 1 application 2 (two) times daily  as needed (pain). 01/21/21  Yes [provider]  insulin aspart (NOVOLOG FLEXPEN) 100 UNIT/ML FlexPen Inject 10 Units into the skin 2 (two) times daily.   Yes [provider]  insulin glargine, 1 Unit Dial, (TOUJEO) 300 UNIT/ML Solostar Pen INJECT 22 UNITS SUBCUTANEOUSLY ONCE DAILY Patient taking differently: Inject 21 Units into the skin every morning. 11/30/19 02/23/21 Yes Breedlove, Brent C, PA-C  lisinopril (ZESTRIL) 20 MG tablet TAKE 1 TABLET BY MOUTH ONCE DAILY Patient taking differently: Take 20 mg by mouth daily with breakfast. 06/27/19 05/17/21 Yes Masneri, Adele Barthel, DO  potassium chloride SA (KLOR-CON M) 20 MEQ tablet Take 20 mEq by mouth daily with breakfast. 01/21/21  Yes [provider]  pramipexole (MIRAPEX) 0.75 MG tablet TAKE ONE TABLET BY MOUTH TWICE DAILY Patient taking differently: Take 1.5 mg by mouth at bedtime. 11/30/19 05/17/21 Yes Breedlove, Lollie Sails, PA-C  torsemide (DEMADEX) 20 MG tablet Take 1 tablet (20 mg total) by mouth daily. Patient taking differently: Take 20 mg by mouth every morning. 09/13/19  Yes Alma Friendly, MD  Alcohol Swabs (SM ALCOHOL PREP) 70 % PADS 1 each by Other route as needed. 11/02/19   [provider]  B-D ULTRAFINE III SHORT PEN 31G X 8 MM MISC Inject 1 each into the skin as directed. 11/02/19   [provider]  Continuous Blood Gluc Sensor (FREESTYLE LIBRE 2 SENSOR) MISC USE TO CHECK BLOOD SUGAR AND CHANGE EVERY 14 DAYS 03/13/20 03/13/21  Madelin Rear, MD  ezetimibe (ZETIA) 10 MG tablet TAKE 1 TABLET (10 MG TOTAL) BY MOUTH DAILY. PLEASE MAKE OVERDUE APPT WITH DR. Radford Pax BEFORE ANYMORE REFILLS. Patient not taking: No sig reported 09/08/19   Sueanne Margarita, MD  Insulin Pen Needle 31G X 8 MM MISC Use as directed under the skin two times daily 06/08/20     iron polysaccharides (NIFEREX) 150 MG capsule Take 1 capsule (150 mg total) by mouth daily. Patient not taking: Reported on 02/23/2021 07/30/17   Patrecia Pour, Christean Grief, MD  isosorbide mononitrate (IMDUR) 60 MG 24 hr tablet TAKE 1 TABLET (60 MG TOTAL) BY MOUTH DAILY. Patient not taking: Reported on 02/23/2021 09/08/19 02/23/21  Sueanne Margarita, MD  metFORMIN (GLUCOPHAGE-XR) 500 MG 24 hr tablet TAKE 1 TABLET BY MOUTH 2 TIMES DAILY WITH FOOD Patient not taking: Reported on 02/23/2021 03/01/20 03/01/21  Ramiro Harvest, PA-C  metoprolol succinate (TOPROL-XL) 50 MG 24 hr tablet TAKE ONE TABLET BY MOUTH ONCE DAILY Patient not taking: Reported on 02/23/2021 11/30/19 11/29/20  Ramiro Harvest, PA-C  nitroGLYCERIN (NITROSTAT) 0.4 MG SL tablet Place 1 tablet (0.4 mg total) under the tongue every 5 (five) minutes as needed for chest pain. Patient not taking: Reported on 02/23/2021 10/01/17 09/08/28  Abigail Butts., PA-C  Semaglutide,0.25 or 0.5MG /DOS, 2 MG/1.5ML SOPN INJECT 0.5MG  ONCE A WEEK Patient not taking: Reported on 02/23/2021 03/13/20 03/13/21  Madelin Rear, MD    Discontinued Meds:   Medications Discontinued During This Encounter  Medication Reason   insulin aspart (NOVOLOG) 100 UNIT/ML injection Duplicate   metFORMIN (GLUCOPHAGE) 1000 MG tablet Change in therapy   lisinopril (ZESTRIL) 20 MG tablet Duplicate   pramipexole (MIRAPEX) [id number] MG tablet Duplicate   aspirin 81 MG chewable tablet Patient Preference  TOUJEO SOLOSTAR 300 UNIT/ML SOPN Patient Preference   Semaglutide,0.25 or 0.5MG /DOS, (OZEMPIC, 0.25 OR 0.5 MG/DOSE,) 2 MG/1.5ML SOPN Patient Preference   pramipexole (MIRAPEX) tablet 0.75 mg    torsemide (DEMADEX) 20 MG tablet Duplicate   atorvastatin (LIPITOR) 80 MG tablet Duplicate   atorvastatin (LIPITOR) 80 MG tablet Duplicate   cholecalciferol (VITAMIN D) 1000 units tablet Patient Preference   lisinopril (ZESTRIL) 20 MG tablet Duplicate   Potassium Chloride ER 20 MEQ TBCR Duplicate   pramipexole (MIRAPEX) [id number] MG tablet Duplicate   insulin aspart (NOVOLOG) 100 UNIT/ML injection Change in therapy   insulin aspart (NOVOLOG) 100  UNIT/ML FlexPen Change in therapy   Multiple Vitamin (MULTIVITAMIN WITH MINERALS) TABS tablet Patient Preference   polyethylene glycol (MIRALAX) packet Patient Preference   Omega-3 1000 MG CAPS Patient Preference   fluticasone (FLONASE) 50 MCG/ACT nasal spray Patient Preference   potassium chloride (KLOR-CON) 10 MEQ tablet Dose change   tamsulosin (FLOMAX) 0.4 MG CAPS capsule Discontinued by provider   metFORMIN (GLUCOPHAGE-XR) 500 MG 24 hr tablet Discontinued by provider   metoprolol succinate (TOPROL-XL) 50 MG 24 hr tablet Discontinued by provider   piperacillin-tazobactam (ZOSYN) IVPB 3.375 g     Social History:  reports that he has never smoked. He has never used smokeless tobacco. He reports that he does not currently use alcohol. He reports that he does not use drugs.  Family History:   Family History  Problem Relation Age of Onset   Hypertension Mother    Stroke Father 48   Hypertension Father    Hypertension Paternal Grandfather    Heart attack Neg Hx     Blood pressure (!) 131/57, pulse 80, temperature 98.5 F (36.9 C), temperature source Oral, resp. rate 19, height 5\' 9"  (1.753 m), weight 110 kg, SpO2 97 %. General appearance: alert, cooperative, and appears stated age Head: Normocephalic, without obvious abnormality, atraumatic Eyes: negative Neck: no adenopathy, no carotid bruit, supple, symmetrical, trachea midline, and thyroid not enlarged, symmetric, no tenderness/mass/nodules Back: symmetric, no curvature. ROM normal. No CVA tenderness. Resp: clear to auscultation bilaterally and distant, no rales Cardio: regular rate and rhythm GI: soft, non-tender; bowel sounds normal; no masses,  no organomegaly Extremities: edema 1+, weeping        Esmeralda Blanford, Hunt Oris, MD 02/24/2021, 1:29 PM

## 2021-02-24 NOTE — Progress Notes (Signed)
Lower extremity venous bilateral study completed.   Please see CV Proc for preliminary results.   Shereda Graw, RDMS, RVT  

## 2021-02-24 NOTE — Plan of Care (Signed)
  Problem: Education: Goal: Knowledge of General Education information will improve Description Including pain rating scale, medication(s)/side effects and non-pharmacologic comfort measures Outcome: Progressing   

## 2021-02-24 NOTE — Evaluation (Signed)
Physical Therapy Evaluation Patient Details Name: James [name] MRN: [id number] DOB: 26-Dec-1945 Today's Date: 02/24/2021  History of Present Illness  Pt is a 76 y.o. male admitted 02/23/21 with BLE weeping and swelling, as well as fall sustaining abrasion to buttocks and LUE. Workup for BLE cellulitis, CHF, AKI on CKD 3. PMH includes HTN, HLD, CHF, CAD, CVA, AAA, CKD.   Clinical Impression  Pt presents with an overall decrease in functional mobility secondary to above. PTA, pt independent, lives alone with daughter nearby. Today, pt moving fairly well with RW and min guard for balance, requires assist for LB dressing. Pt adamantly against post-acute rehab at SNF due to prior past experience; agreeable for HHPT services. Will follow acutely to maximize functional mobility and independence prior to return home.    SpO2 94% on RA HR 80s    Recommendations for follow up therapy are one component of a multi-disciplinary discharge planning process, led by the attending physician.  Recommendations may be updated based on patient status, additional functional criteria and insurance authorization.  Follow Up Recommendations Home health PT    Assistance Recommended at Discharge Intermittent Supervision/Assistance  Patient can return home with the following  Help with stairs or ramp for entrance;Assistance with cooking/housework;Assist for transportation    Equipment Recommendations None recommended by PT  Recommendations for Other Services       Functional Status Assessment Patient has had a recent decline in their functional status and demonstrates the ability to make significant improvements in function in a reasonable and predictable amount of time.     Precautions / Restrictions Precautions Precautions: Fall Restrictions Weight Bearing Restrictions: No      Mobility  Bed Mobility Overal bed mobility: Needs Assistance Bed Mobility: Supine to Sit     Supine to sit: Min assist;HOB  elevated     General bed mobility comments: MinA for HHA to elevate trunk    Transfers Overall transfer level: Needs assistance Equipment used: Rolling walker (2 wheels) Transfers: Sit to/from Stand Sit to Stand: Min guard           General transfer comment: Min guard to power into standing, good hand placement with RW    Ambulation/Gait Ambulation/Gait assistance: Min guard Gait Distance (Feet): 60 Feet Assistive device: Rolling walker (2 wheels) Gait Pattern/deviations: Step-through pattern;Decreased stride length;Trunk flexed Gait velocity: Decreased     General Gait Details: Slow, fatigued gait with RW and min guard for balance; pt declined further distance due to fatigue  Stairs            Wheelchair Mobility    Modified Rankin (Stroke Patients Only)       Balance Overall balance assessment: Needs assistance   Sitting balance-Leahy Scale: Fair     Standing balance support: Reliant on assistive device for balance;During functional activity Standing balance-Leahy Scale: Poor                               Pertinent Vitals/Pain Pain Assessment: Faces Faces Pain Scale: Hurts a little bit Pain Location: BLEs Pain Descriptors / Indicators: Sore Pain Intervention(s): Monitored during session    Home Living Family/patient expects to be discharged to:: Private residence Living Arrangements: Alone Available Help at Discharge: Family;Available PRN/intermittently Type of Home: House Home Access: Stairs to enter Entrance Stairs-Rails: Right Entrance Stairs-Number of Steps: 4   Home Layout: Two level;Able to live on main level with bedroom/bathroom;Full bath on main level Home Equipment:  Rollator (4 wheels);Cane - single point;Grab bars - tub/shower;Shower seat Additional Comments: Pt reports daughter cannot assist since she has five children. Per chart, daughter checks on pt daily    Prior Function Prior Level of Function :  Independent/Modified Independent;Driving;History of Falls (last six months)             Mobility Comments: Reports independent without DME; uses walking stick for outdoors/community ambulation. Drives ADLs Comments: Reports independent for DME, stands to shower; has difficulty reaching feet so typically goes barefoot. Organized medications mailed to him     Hand Dominance        Extremity/Trunk Assessment   Upper Extremity Assessment Upper Extremity Assessment: Overall WFL for tasks assessed    Lower Extremity Assessment Lower Extremity Assessment: Generalized weakness (noted bilateral lower leg/foot swelling and redness; calves wrapped in gauze)    Cervical / Trunk Assessment Cervical / Trunk Assessment: Kyphotic  Communication   Communication: No difficulties  Cognition Arousal/Alertness: Awake/alert Behavior During Therapy: WFL for tasks assessed/performed Overall Cognitive Status: Within Functional Limits for tasks assessed                                 General Comments: WFL for simple tasks, although question some decreased insight/awareness        General Comments General comments (skin integrity, edema, etc.): SpO2 94% on RA, HR 80s    Exercises     Assessment/Plan    PT Assessment Patient needs continued PT services  PT Problem List Decreased strength;Decreased activity tolerance;Decreased balance;Decreased mobility;Impaired sensation;Decreased knowledge of use of DME       PT Treatment Interventions DME instruction;Gait training;Stair training;Functional mobility training;Therapeutic activities;Therapeutic exercise;Balance training;Patient/family education    PT Goals (Current goals can be found in the Care Plan section)  Acute Rehab PT Goals Patient Stated Goal: Adamant about return home; had horrible prior experience at SNF PT Goal Formulation: With patient Time For Goal Achievement: 03/10/21 Potential to Achieve Goals: Good     Frequency Min 3X/week     Co-evaluation               AM-PAC PT "6 Clicks" Mobility  Outcome Measure Help needed turning from your back to your side while in a flat bed without using bedrails?: None Help needed moving from lying on your back to sitting on the side of a flat bed without using bedrails?: A Little Help needed moving to and from a bed to a chair (including a wheelchair)?: A Little Help needed standing up from a chair using your arms (e.g., wheelchair or bedside chair)?: A Little Help needed to walk in hospital room?: A Little Help needed climbing 3-5 steps with a railing? : A Little 6 Click Score: 19    End of Session Equipment Utilized During Treatment: Gait belt Activity Tolerance: Patient tolerated treatment well Patient left: in chair;with call bell/phone within reach;with chair alarm set Nurse Communication: Mobility status (bleeding/drainage from RLE, RN aware) PT Visit Diagnosis: Other abnormalities of gait and mobility (R26.89);Muscle weakness (generalized) (M62.81)    Time: 2458-0998 PT Time Calculation (min) (ACUTE ONLY): 26 min   Charges:   PT Evaluation $PT Eval Moderate Complexity: 1 Mod     Ina Homes, PT, DPT Acute Rehabilitation Services  Pager [phone] Office [phone]  Malachy Chamber 02/24/2021, 5:13 PM

## 2021-02-24 NOTE — Social Work (Signed)
CSW acknowledges consult for SNF/HH. The patient will require PT/OT evaluations. TOC will assist with disposition planning once the evaluations have been completed.  °  °TOC will continue to follow.    °

## 2021-02-24 NOTE — Progress Notes (Addendum)
PROGRESS NOTE    James [name] [facility]  [id number] DOB: May 06, 1945 DOA: 02/23/2021 PCP: Lyman Bishop, DO    Brief Narrative:   James [name] is a 76 y.o. male with medical history significant of hypertension, hyperlipidemia, systolic congestive heart failure, CAD, CVA, AAA, and CKD presents with complaints of his legs weeping over the last 2 weeks.  He noted that his orders were covered in the drainage from his wounds and noted that there was a foul odor to it.  Patient reports that about 6 months ago his PCP at decrease his torsemide from 20 mg daily to 10 mg due to him having urinary frequency and reporting being in the bathroom all day.  Patient's daughter was able to get him to his primary care office yesterday where they recommended him to increase back to torsemide 20 mg daily due to the leg swelling, suspected him to have signs of cellulitis of his lower extremities for which they had prescribed Augmentin, and performed blood work.  After getting back home patient reportedly lost his balance and fell on his bottom sustaining an abrasion to his left arm.  He states that he did not hit his head and called EMS who came to the house.   1/8 K  5.6  Consultants:    Procedures:   Antimicrobials:  Vanco and zosyn    Subjective: Wondering if he is going home. While laying down denies sob. No cp.   Objective: Vitals:   02/23/21 1530 02/23/21 1745 02/23/21 1927 02/24/21 0500  BP: 121/67 126/74 114/60 122/64  Pulse: 72 84 89 72  Resp: 19 (!) 22 18 18   Temp:   98.4 F (36.9 C) 98.4 F (36.9 C)  TempSrc:   Oral Oral  SpO2: 94% 98% 94% 96%  Weight:   111.9 kg 110 kg  Height:   5\' 9"  (1.753 m)     Intake/Output Summary (Last 24 hours) at 02/24/2021 0907 Last data filed at 02/24/2021 0500 Gross per 24 hour  Intake 487.53 ml  Output 700 ml  Net -212.47 ml   Filed Weights   02/23/21 0235 02/23/21 1927 02/24/21 0500  Weight: 108.9 kg 111.9 kg 110 kg    Examination:  General exam:  Appears calm and comfortable  Respiratory system: Decreased breath sounds minimal crackles no wheezing  cardiovascular system: S1 & S2 heard, RRR.  Difficult to assess JVD, no gallops Gastrointestinal system: Abdomen is nondistended, soft and nontender Normal bowel sounds heard. Central nervous system: Alert and oriented.  Grossly intact extremities: +LE edema b/l dressing in place. Psychiatry: Judgement and insight appear normal. Mood & affect appropriate.     Data Reviewed: I have personally reviewed following labs and imaging studies  CBC: Recent Labs  Lab 02/23/21 0303 02/23/21 0744  WBC 12.7*  --   NEUTROABS  --  7.9*  HGB 12.9*  --   HCT 40.0  --   MCV 91.1  --   PLT 327  --    Basic Metabolic Panel: Recent Labs  Lab 02/23/21 0303 02/23/21 0942 02/23/21 2012 02/24/21 0315  NA 137 135 137 136  K 6.5* 6.4* 5.8* 5.6*  CL 109 109 108 108  CO2 19* 16* 20* 19*  GLUCOSE 139* 149* 192* 112*  BUN 79* 77* 74* 73*  CREATININE 3.14* 2.92* 3.01* 3.08*  CALCIUM 8.7* 8.7* 8.4* 8.5*  MG 2.6*  --   --   --    GFR: Estimated Creatinine Clearance: 25.3 mL/min (A) (by C-G  formula based on SCr of 3.08 mg/dL (H)). Liver Function Tests: Recent Labs  Lab 02/23/21 0303  AST 17  ALT 16  ALKPHOS 118  BILITOT 0.3  PROT 7.6  ALBUMIN 3.6   No results for input(s): LIPASE, AMYLASE in the last 168 hours. No results for input(s): AMMONIA in the last 168 hours. Coagulation Profile: No results for input(s): INR, PROTIME in the last 168 hours. Cardiac Enzymes: Recent Labs  Lab 02/23/21 2012  CKTOTAL 470*   BNP (last 3 results) No results for input(s): PROBNP in the last 8760 hours. HbA1C: Recent Labs    02/23/21 2012  HGBA1C 7.6*   CBG: Recent Labs  Lab 02/23/21 0754 02/23/21 1947 02/24/21 0740  GLUCAP 143* 185* 113*   Lipid Profile: No results for input(s): CHOL, HDL, LDLCALC, TRIG, CHOLHDL, LDLDIRECT in the last 72 hours. Thyroid Function Tests: No results for  input(s): TSH, T4TOTAL, FREET4, T3FREE, THYROIDAB in the last 72 hours. Anemia Panel: No results for input(s): VITAMINB12, FOLATE, FERRITIN, TIBC, IRON, RETICCTPCT in the last 72 hours. Sepsis Labs: Recent Labs  Lab 02/23/21 0644  LATICACIDVEN 1.0    Recent Results (from the past 240 hour(s))  Resp Panel by RT-PCR (Flu A&B, Covid) Nasopharyngeal Swab     Status: None   Collection Time: 02/23/21  6:41 AM   Specimen: Nasopharyngeal Swab; Nasopharyngeal(NP) swabs in vial transport medium  Result Value Ref Range Status   SARS Coronavirus 2 by RT PCR NEGATIVE NEGATIVE Final    Comment: (NOTE) SARS-CoV-2 target nucleic acids are NOT DETECTED.  The SARS-CoV-2 RNA is generally detectable in upper respiratory specimens during the acute phase of infection. The lowest concentration of SARS-CoV-2 viral copies this assay can detect is 138 copies/mL. A negative result does not preclude SARS-Cov-2 infection and should not be used as the sole basis for treatment or other patient management decisions. A negative result may occur with  improper specimen collection/handling, submission of specimen other than nasopharyngeal swab, presence of viral mutation(s) within the areas targeted by this assay, and inadequate number of viral copies(<138 copies/mL). A negative result must be combined with clinical observations, patient history, and epidemiological information. The expected result is Negative.  Fact Sheet for Patients:  EntrepreneurPulse.com.au  Fact Sheet for Healthcare Providers:  IncredibleEmployment.be  This test is no t yet approved or cleared by the [place] FDA and  has been authorized for detection and/or diagnosis of SARS-CoV-2 by FDA under an Emergency Use Authorization (EUA). This EUA will remain  in effect (meaning this test can be used) for the duration of the COVID-19 declaration under Section 564(b)(1) of the Act, 21 U.S.C.section  360bbb-3(b)(1), unless the authorization is terminated  or revoked sooner.       Influenza A by PCR NEGATIVE NEGATIVE Final   Influenza B by PCR NEGATIVE NEGATIVE Final    Comment: (NOTE) The Xpert Xpress SARS-CoV-2/FLU/RSV plus assay is intended as an aid in the diagnosis of influenza from Nasopharyngeal swab specimens and should not be used as a sole basis for treatment. Nasal washings and aspirates are unacceptable for Xpert Xpress SARS-CoV-2/FLU/RSV testing.  Fact Sheet for Patients: EntrepreneurPulse.com.au  Fact Sheet for Healthcare Providers: IncredibleEmployment.be  This test is not yet approved or cleared by the [place] FDA and has been authorized for detection and/or diagnosis of SARS-CoV-2 by FDA under an Emergency Use Authorization (EUA). This EUA will remain in effect (meaning this test can be used) for the duration of the COVID-19 declaration under Section 564(b)(1) of  the Act, 21 U.S.C. section 360bbb-3(b)(1), unless the authorization is terminated or revoked.  Performed at [facility], [place] [address], [place], [place] [address]   Blood culture (routine x 2)     Status: None (Preliminary result)   Collection Time: 02/23/21  7:33 AM   Specimen: BLOOD RIGHT HAND  Result Value Ref Range Status   Specimen Description BLOOD RIGHT HAND  Final   Special Requests   Final    BOTTLES DRAWN AEROBIC AND ANAEROBIC Blood Culture results may not be optimal due to an inadequate volume of blood received in culture bottles   Culture   Final    NO GROWTH 1 DAY Performed at [facility], [place] [address] [place], [place] [address]    Report Status PENDING  Incomplete  Blood culture (routine x 2)     Status: None (Preliminary result)   Collection Time: 02/23/21  7:44 AM   Specimen: BLOOD  Result Value Ref Range Status   Specimen Description BLOOD SITE NOT SPECIFIED  Final   Special Requests   Final    BOTTLES DRAWN  AEROBIC AND ANAEROBIC Blood Culture adequate volume   Culture   Final    NO GROWTH 1 DAY Performed at [facility], [place] [address], [place], [place] [address]    Report Status PENDING  Incomplete         Radiology Studies: DG CHEST PORT 1 VIEW  Result Date: 02/23/2021 CLINICAL DATA:  Shortness of breath and edema. EXAM: PORTABLE CHEST 1 VIEW COMPARISON:  09/09/2019 FINDINGS: 0700 hours. The cardio pericardial silhouette is enlarged. There is pulmonary vascular congestion without overt pulmonary edema. Probable persistent retrocardiac collapse/consolidation and left pleural effusion not excluded. Parahilar subsegmental atelectasis noted left lung, as before. Right lung clear. The visualized bony structures of the thorax show no acute abnormality. IMPRESSION: 1. Cardiomegaly with probable retrocardiac left base collapse/consolidation. Small left effusion cannot be excluded. 2. Pulmonary vascular congestion without overt pulmonary edema. Electronically Signed   By: Misty Stanley M.D.   On: 02/23/2021 07:17        Scheduled Meds:  atorvastatin  80 mg Oral Daily   clopidogrel  75 mg Oral Daily   furosemide  40 mg Intravenous BID   heparin  5,000 Units Subcutaneous Q8H   insulin aspart  0-15 Units Subcutaneous TID WC   insulin glargine-yfgn  10 Units Subcutaneous QHS   pramipexole  1.5 mg Oral QHS   sodium chloride flush  3 mL Intravenous Q12H   vancomycin variable dose per unstable renal function (pharmacist dosing)   Does not apply See admin instructions   Continuous Infusions:  piperacillin-tazobactam (ZOSYN)  IV Stopped (02/24/21 0500)    Assessment & Plan:   Principal Problem:   Cellulitis Active Problems:   Acute CHF (congestive heart failure) (HCC)   History of CVA (cerebrovascular accident)   Hyperkalemia   Acute kidney injury superimposed on chronic kidney disease (HCC)   Normocytic anemia   Cellulitis of LE: Acute Continue abx , but will change combo of vanco  and zosyn with his current AKI to prevent worsening of renal function. Will start cefepime.  CRP elevated Venous ultrasound negative for DVT bilateral Wound care consulted Bcx pending   A/c combined systolic and diastolic heart failure/lower extremity edema Continue with IV diuretics Echo with EF 40 to 45% no significant difference from his previous echo last year.  Grade 2 diastolic dysfunction BNP elevated, likely underestimated due to obesity Check  INO's Daily weight Will need to follow-up with CHF clinic as outpatient   AKI on CKD stage IIIb Creatinine worsening It could be could be due to heart failure exacerbation causing hypoperfusion  or just progression of dz.  Will obtain renal ultrasound Will consult nephrology Continue with diuretics Avoid nephrotoxic meds  Hyperkalemia K 6.5>>>5.6 Was given Lokelma, will repeat the dose Monitor levels  Diabetes mellitus type 2 Continue insulin R ISS   History of CVA Continue Plavix and statin  Essential hypertension Controlled Hold lisinopril due to hyperkalemia  Status post f mechanical all/debility Will obtain PT OT TOC for home health/need of rehab  Non-anion gap metabolic acidosis Possibly due to renal dysfunction May need bicarb but will defer to nephrology  Normocytic anemia H&H stable   DVT prophylaxis: Heparin Code Status: Full Family Communication: None at bedside Disposition Plan: TBD Status is: Inpatient  Remains inpatient appropriate because: IV treatment.  Needs work-up for his AKI             LOS: 1 day   Time spent: 45 minutes with more than 50% on [place], MD [facility] Pager [phone]  If 7PM-7AM, please contact night-coverage 02/24/2021, 9:07 AM

## 2021-02-25 LAB — BASIC METABOLIC PANEL
Anion gap: 9 (ref 5–15)
BUN: 79 mg/dL — ABNORMAL HIGH (ref 8–23)
CO2: 21 mmol/L — ABNORMAL LOW (ref 22–32)
Calcium: 8.6 mg/dL — ABNORMAL LOW (ref 8.9–10.3)
Chloride: 105 mmol/L (ref 98–111)
Creatinine, Ser: 2.98 mg/dL — ABNORMAL HIGH (ref 0.61–1.24)
GFR, Estimated: 21 mL/min — ABNORMAL LOW (ref >60)
Glucose, Bld: 92 mg/dL (ref 70–99)
Potassium: 5.2 mmol/L — ABNORMAL HIGH (ref 3.5–5.1)
Sodium: 135 mmol/L (ref 135–145)

## 2021-02-25 LAB — GLUCOSE, CAPILLARY
Glucose-Capillary: 122 mg/dL — ABNORMAL HIGH (ref 70–99)
Glucose-Capillary: 116 mg/dL — ABNORMAL HIGH (ref 70–99)
Glucose-Capillary: 177 mg/dL — ABNORMAL HIGH (ref 70–99)
Glucose-Capillary: 80 mg/dL (ref 70–99)

## 2021-02-25 LAB — UREA NITROGEN, URINE: Urea Nitrogen, Ur: 333 mg/dL

## 2021-02-25 MED ORDER — ACETAMINOPHEN 325 MG PO TABS
650.0000 mg | ORAL_TABLET | Freq: Four times a day (QID) | ORAL | Status: DC | PRN
  Administered 2021-02-25 – 2021-02-27 (×6): 650 mg via ORAL
  Filled 2021-02-25 (×6): qty 2

## 2021-02-25 MED ORDER — VANCOMYCIN HCL 1250 MG/250ML IV SOLN
1250.0000 mg | INTRAVENOUS | Status: AC
  Administered 2021-02-25 – 2021-02-27 (×2): 1250 mg via INTRAVENOUS
  Filled 2021-02-25 (×2): qty 250

## 2021-02-25 NOTE — Progress Notes (Addendum)
PROGRESS NOTE    James [name] [facility]  [id number] DOB: Jun 21, 1945 DOA: 02/23/2021 PCP: Lyman Bishop, DO    Brief Narrative:   James [name] is a 76 y.o. male with medical history significant of hypertension, hyperlipidemia, systolic congestive heart failure, CAD, CVA, AAA, and CKD presents with complaints of his legs weeping over the last 2 weeks.  He noted that his orders were covered in the drainage from his wounds and noted that there was a foul odor to it.  Patient reports that about 6 months ago his PCP at decrease his torsemide from 20 mg daily to 10 mg due to him having urinary frequency and reporting being in the bathroom all day.  Patient's daughter was able to get him to his primary care office yesterday where they recommended him to increase back to torsemide 20 mg daily due to the leg swelling, suspected him to have signs of cellulitis of his lower extremities for which they had prescribed Augmentin, and performed blood work.  After getting back home patient reportedly lost his balance and fell on his bottom sustaining an abrasion to his left arm.  He states that he did not hit his head and called EMS who came to the house.   1/9 potassium 5.2 . Pt reports "feeling well'.   Consultants:    Procedures:   Antimicrobials:  Vanco and zosyn ...>dc'd 1/8   Subjective: No cp or sob this am. Sitting in recliner.  Objective: Vitals:   02/24/21 1324 02/24/21 2042 02/25/21 0346 02/25/21 0518  BP: (!) 131/57  (!) 143/55   Pulse: 80  (!) 29   Resp: 19  17   Temp: 98.5 F (36.9 C) 98.2 F (36.8 C) 98.2 F (36.8 C)   TempSrc: Oral Oral Oral   SpO2: 97%  93%   Weight:    110.9 kg  Height:        Intake/Output Summary (Last 24 hours) at 02/25/2021 0856 Last data filed at 02/25/2021 0343 Gross per 24 hour  Intake 100 ml  Output 2000 ml  Net -1900 ml   Filed Weights   02/23/21 1927 02/24/21 0500 02/25/21 0518  Weight: 111.9 kg 110 kg 110.9 kg    Examination: Nad,  calm Decrease bs, no wheezing Reg s1/s2 no gallop Soft benign +bs +dressing in place b/l decrease edema Awake and oriented x3 Mood and affect appropriate in current setting    Data Reviewed: I have personally reviewed following labs and imaging studies  CBC: Recent Labs  Lab 02/23/21 0303 02/23/21 0744  WBC 12.7*  --   NEUTROABS  --  7.9*  HGB 12.9*  --   HCT 40.0  --   MCV 91.1  --   PLT 327  --    Basic Metabolic Panel: Recent Labs  Lab 02/23/21 0303 02/23/21 0942 02/23/21 2012 02/24/21 0315 02/25/21 0325  NA 137 135 137 136 135  K 6.5* 6.4* 5.8* 5.6* 5.2*  CL 109 109 108 108 105  CO2 19* 16* 20* 19* 21*  GLUCOSE 139* 149* 192* 112* 92  BUN 79* 77* 74* 73* 79*  CREATININE 3.14* 2.92* 3.01* 3.08* 2.98*  CALCIUM 8.7* 8.7* 8.4* 8.5* 8.6*  MG 2.6*  --   --   --   --    GFR: Estimated Creatinine Clearance: 26.3 mL/min (A) (by C-G formula based on SCr of 2.98 mg/dL (H)). Liver Function Tests: Recent Labs  Lab 02/23/21 0303  AST 17  ALT 16  ALKPHOS 118  BILITOT 0.3  PROT 7.6  ALBUMIN 3.6   No results for input(s): LIPASE, AMYLASE in the last 168 hours. No results for input(s): AMMONIA in the last 168 hours. Coagulation Profile: No results for input(s): INR, PROTIME in the last 168 hours. Cardiac Enzymes: Recent Labs  Lab 02/23/21 2012  CKTOTAL 470*   BNP (last 3 results) No results for input(s): PROBNP in the last 8760 hours. HbA1C: Recent Labs    02/23/21 2012  HGBA1C 7.6*   CBG: Recent Labs  Lab 02/24/21 0740 02/24/21 1148 02/24/21 1711 02/24/21 2042 02/25/21 0739  GLUCAP 113* 105* 130* 104* 122*   Lipid Profile: No results for input(s): CHOL, HDL, LDLCALC, TRIG, CHOLHDL, LDLDIRECT in the last 72 hours. Thyroid Function Tests: No results for input(s): TSH, T4TOTAL, FREET4, T3FREE, THYROIDAB in the last 72 hours. Anemia Panel: No results for input(s): VITAMINB12, FOLATE, FERRITIN, TIBC, IRON, RETICCTPCT in the last 72 hours. Sepsis  Labs: Recent Labs  Lab 02/23/21 0644  LATICACIDVEN 1.0    Recent Results (from the past 240 hour(s))  Resp Panel by RT-PCR (Flu A&B, Covid) Nasopharyngeal Swab     Status: None   Collection Time: 02/23/21  6:41 AM   Specimen: Nasopharyngeal Swab; Nasopharyngeal(NP) swabs in vial transport medium  Result Value Ref Range Status   SARS Coronavirus 2 by RT PCR NEGATIVE NEGATIVE Final    Comment: (NOTE) SARS-CoV-2 target nucleic acids are NOT DETECTED.  The SARS-CoV-2 RNA is generally detectable in upper respiratory specimens during the acute phase of infection. The lowest concentration of SARS-CoV-2 viral copies this assay can detect is 138 copies/mL. A negative result does not preclude SARS-Cov-2 infection and should not be used as the sole basis for treatment or other patient management decisions. A negative result may occur with  improper specimen collection/handling, submission of specimen other than nasopharyngeal swab, presence of viral mutation(s) within the areas targeted by this assay, and inadequate number of viral copies(<138 copies/mL). A negative result must be combined with clinical observations, patient history, and epidemiological information. The expected result is Negative.  Fact Sheet for Patients:  EntrepreneurPulse.com.au  Fact Sheet for Healthcare Providers:  IncredibleEmployment.be  This test is no t yet approved or cleared by the [place] FDA and  has been authorized for detection and/or diagnosis of SARS-CoV-2 by FDA under an Emergency Use Authorization (EUA). This EUA will remain  in effect (meaning this test can be used) for the duration of the COVID-19 declaration under Section 564(b)(1) of the Act, 21 U.S.C.section 360bbb-3(b)(1), unless the authorization is terminated  or revoked sooner.       Influenza A by PCR NEGATIVE NEGATIVE Final   Influenza B by PCR NEGATIVE NEGATIVE Final    Comment: (NOTE) The  Xpert Xpress SARS-CoV-2/FLU/RSV plus assay is intended as an aid in the diagnosis of influenza from Nasopharyngeal swab specimens and should not be used as a sole basis for treatment. Nasal washings and aspirates are unacceptable for Xpert Xpress SARS-CoV-2/FLU/RSV testing.  Fact Sheet for Patients: EntrepreneurPulse.com.au  Fact Sheet for Healthcare Providers: IncredibleEmployment.be  This test is not yet approved or cleared by the [place] FDA and has been authorized for detection and/or diagnosis of SARS-CoV-2 by FDA under an Emergency Use Authorization (EUA). This EUA will remain in effect (meaning this test can be used) for the duration of the COVID-19 declaration under Section 564(b)(1) of the Act, 21 U.S.C. section 360bbb-3(b)(1), unless the authorization is terminated or revoked.  Performed at [facility], [address] [place], [place] [address]   Blood culture (routine x 2)     Status: None (Preliminary result)   Collection Time: 02/23/21  7:33 AM   Specimen: BLOOD RIGHT HAND  Result Value Ref Range Status   Specimen Description BLOOD RIGHT HAND  Final   Special Requests   Final    BOTTLES DRAWN AEROBIC AND ANAEROBIC Blood Culture results may not be optimal due to an inadequate volume of blood received in culture bottles   Culture   Final    NO GROWTH 1 DAY Performed at [facility], [place] [address], [place], [place] [address]    Report Status PENDING  Incomplete  Blood culture (routine x 2)     Status: None (Preliminary result)   Collection Time: 02/23/21  7:44 AM   Specimen: BLOOD  Result Value Ref Range Status   Specimen Description BLOOD SITE NOT SPECIFIED  Final   Special Requests   Final    BOTTLES DRAWN AEROBIC AND ANAEROBIC Blood Culture adequate volume   Culture   Final    NO GROWTH 1 DAY Performed at [facility], [place] [address], [place], [place] [address]    Report Status PENDING  Incomplete          Radiology Studies: US RENAL  Result Date: 02/24/2021 CLINICAL DATA:  Acute kidney injury. EXAM: RENAL / URINARY TRACT ULTRASOUND COMPLETE COMPARISON:  CT, 07/24/2017. FINDINGS: Right Kidney: Renal measurements: 10.4 x 4.7 x 5.1 cm = volume: 132 mL. Echogenicity within normal limits. No mass or hydronephrosis visualized. Left Kidney: Renal measurements: 10.8 x 5.6 x 5.1 cm = volume: 161 mL. Normal parenchymal echogenicity. Cyst arises from the midpole measuring 4.6 x 4.2 x 4.3 cm. No other masses, no stones and no hydronephrosis. Bladder: Appears normal for degree of bladder distention. Other: None. IMPRESSION: 1. No acute findings.  No hydronephrosis. 2. 4.6 cm simple appearing left midpole renal cyst. No other abnormalities. Electronically Signed   By: Lajean Manes M.D.   On: 02/24/2021 15:26   ECHOCARDIOGRAM COMPLETE  Result Date: 02/24/2021    ECHOCARDIOGRAM REPORT   Patient Name:   James [name] Date of Exam: 02/24/2021 Medical Rec #:  [id number]     Height:       69.0 in Accession #:    [id number]    Weight:       242.5 lb Date of Birth:  01-02-1946     BSA:          2.242 m Patient Age:    22 years      BP:           127/64 mmHg Patient Gender: M             HR:           72 bpm. Exam Location:  Inpatient Procedure: 2D Echo, Cardiac Doppler and Color Doppler Indications:    CHF-Acute systolic  History:        Patient has prior history of Echocardiogram examinations. CHF,                 CAD, Prior CABG; Risk Factors:Hypertension, Diabetes and                 Dyslipidemia. AAA.  Sonographer:    Clayton Lefort RDCS (AE) Referring Phys: [id number] RONDELL A SMITH IMPRESSIONS  1. Technically difficult due to poor sound wave transmission; probable mild LV dysfunction with EF 45; consider cardiac MRI to  more definitively define LV function if clinicalliy indicated.  2. Left ventricular ejection fraction, by estimation, is 40 to 45%. The left ventricle has mildly decreased function. The left ventricle  demonstrates global hypokinesis. Left ventricular diastolic parameters are consistent with Grade II diastolic dysfunction (pseudonormalization). Elevated left atrial pressure.  3. Right ventricular systolic function is normal. The right ventricular size is mildly enlarged.  4. Right atrial size was mildly dilated.  5. The mitral valve is normal in structure. Trivial mitral valve regurgitation. No evidence of mitral stenosis.  6. The aortic valve is tricuspid. Aortic valve regurgitation is not visualized. Aortic valve sclerosis is present, with no evidence of aortic valve stenosis.  7. The inferior vena cava is normal in size with greater than 50% respiratory variability, suggesting right atrial pressure of 3 mmHg. FINDINGS  Left Ventricle: Left ventricular ejection fraction, by estimation, is 40 to 45%. The left ventricle has mildly decreased function. The left ventricle demonstrates global hypokinesis. The left ventricular internal cavity size was normal in size. There is  no left ventricular hypertrophy. Left ventricular diastolic parameters are consistent with Grade II diastolic dysfunction (pseudonormalization). Elevated left atrial pressure. Right Ventricle: The right ventricular size is mildly enlarged. Right ventricular systolic function is normal. Left Atrium: Left atrial size was normal in size. Right Atrium: Right atrial size was mildly dilated. Pericardium: There is no evidence of pericardial effusion. Mitral Valve: The mitral valve is normal in structure. Trivial mitral valve regurgitation. No evidence of mitral valve stenosis. Tricuspid Valve: The tricuspid valve is normal in structure. Tricuspid valve regurgitation is trivial. No evidence of tricuspid stenosis. Aortic Valve: The aortic valve is tricuspid. Aortic valve regurgitation is not visualized. Aortic valve sclerosis is present, with no evidence of aortic valve stenosis. Aortic valve mean gradient measures 3.0 mmHg. Aortic valve peak gradient  measures 5.7  mmHg. Aortic valve area, by VTI measures 3.98 cm. Pulmonic Valve: The pulmonic valve was not well visualized. Pulmonic valve regurgitation is mild. No evidence of pulmonic stenosis. Aorta: The aortic root is normal in size and structure. Venous: The inferior vena cava is normal in size with greater than 50% respiratory variability, suggesting right atrial pressure of 3 mmHg. IAS/Shunts: The interatrial septum was not well visualized. Additional Comments: Technically difficult due to poor sound wave transmission; probable mild LV dysfunction with EF 45; consider cardiac MRI to more definitively define LV function if clinicalliy indicated.  LEFT VENTRICLE PLAX 2D LVOT diam:     2.40 cm   Diastology LV SV:         92        LV e' medial:    5.87 cm/s LV SV Index:   41        LV E/e' medial:  17.2 LVOT Area:     4.52 cm  LV e' lateral:   11.10 cm/s                          LV E/e' lateral: 9.1  RIGHT VENTRICLE             IVC RV Basal diam:  4.20 cm     IVC diam: 1.50 cm RV Mid diam:    3.90 cm RV S prime:     13.10 cm/s TAPSE (M-mode): 1.8 cm LEFT ATRIUM             Index        RIGHT ATRIUM  Index LA Vol (A2C):   49.1 ml 21.90 ml/m  RA Area:     31.30 cm LA Vol (A4C):   67.3 ml 30.01 ml/m  RA Volume:   101.00 ml 45.04 ml/m LA Biplane Vol: 59.3 ml 26.45 ml/m  AORTIC VALVE AV Area (Vmax):    4.30 cm AV Area (Vmean):   3.94 cm AV Area (VTI):     3.98 cm AV Vmax:           119.00 cm/s AV Vmean:          82.400 cm/s AV VTI:            0.232 m AV Peak Grad:      5.7 mmHg AV Mean Grad:      3.0 mmHg LVOT Vmax:         113.00 cm/s LVOT Vmean:        71.800 cm/s LVOT VTI:          0.204 m LVOT/AV VTI ratio: 0.88  AORTA Ao Root diam: 3.40 cm Ao Asc diam:  3.50 cm MITRAL VALVE MV Area (PHT): 5.66 cm     SHUNTS MV Decel Time: 134 msec     Systemic VTI:  0.20 m MV E velocity: 101.00 cm/s  Systemic Diam: 2.40 cm MV A velocity: 66.60 cm/s MV E/A ratio:  1.52 Kirk Ruths MD Electronically signed  by Kirk Ruths MD Signature Date/Time: 02/24/2021/10:00:51 AM    Final    VAS [place] LOWER EXTREMITY VENOUS (DVT)  Result Date: 02/24/2021  Lower Venous DVT Study Patient Name:  James [name]  Date of Exam:   02/24/2021 Medical Rec #: [id number]      Accession #:    [id number] Date of Birth: [birth year]      Patient Gender: M Patient Age:   66 years Exam Location:  [facility] Procedure:      VAS [place] LOWER EXTREMITY VENOUS (DVT) Referring Phys: Fuller Plan --------------------------------------------------------------------------------  Indications: Swelling.  Limitations: Bandages. Comparison       07-25-2017 Bilateral lower extremity venous was negative for Study:           DVT. Performing Technologist: Darlin Coco RDMS, RVT  Examination Guidelines: A complete evaluation includes B-mode imaging, spectral Doppler, color Doppler, and power Doppler as needed of all accessible portions of each vessel. Bilateral testing is considered an integral part of a complete examination. Limited examinations for reoccurring indications may be performed as noted. The reflux portion of the exam is performed with the patient in reverse Trendelenburg.  +---------+---------------+---------+-----------+----------+-------------------+  RIGHT     Compressibility Phasicity Spontaneity Properties Thrombus Aging       +---------+---------------+---------+-----------+----------+-------------------+  CFV       Full            Yes       Yes                                         +---------+---------------+---------+-----------+----------+-------------------+  SFJ       Full                                                                  +---------+---------------+---------+-----------+----------+-------------------+  FV Prox   Full                                                                  +---------+---------------+---------+-----------+----------+-------------------+  FV Mid    Full                                                                   +---------+---------------+---------+-----------+----------+-------------------+  FV Distal Full                                                                  +---------+---------------+---------+-----------+----------+-------------------+  PFV       Full                                                                  +---------+---------------+---------+-----------+----------+-------------------+  POP       Full            Yes       Yes                                         +---------+---------------+---------+-----------+----------+-------------------+  PTV                                                        Unable to visualize                                                              due to bandaging     +---------+---------------+---------+-----------+----------+-------------------+  PERO                                                       Unable to visualize  due to bandaging     +---------+---------------+---------+-----------+----------+-------------------+   +---------+---------------+---------+-----------+----------+-------------------+  LEFT      Compressibility Phasicity Spontaneity Properties Thrombus Aging       +---------+---------------+---------+-----------+----------+-------------------+  CFV       Full            Yes       Yes                                         +---------+---------------+---------+-----------+----------+-------------------+  SFJ       Full                                                                  +---------+---------------+---------+-----------+----------+-------------------+  FV Prox   Full                                                                  +---------+---------------+---------+-----------+----------+-------------------+  FV Mid    Full                                                                   +---------+---------------+---------+-----------+----------+-------------------+  FV Distal Full                                                                  +---------+---------------+---------+-----------+----------+-------------------+  PFV       Full                                                                  +---------+---------------+---------+-----------+----------+-------------------+  POP       Full            Yes       Yes                                         +---------+---------------+---------+-----------+----------+-------------------+  PTV                                                        Unable to visualize  due to bandaging     +---------+---------------+---------+-----------+----------+-------------------+  PERO                                                       Unable to visualize                                                              due to bandaging     +---------+---------------+---------+-----------+----------+-------------------+     Summary: RIGHT: - There is no evidence of deep vein thrombosis in the lower extremity. However, portions of this examination were limited- see technologist comments above.  - No cystic structure found in the popliteal fossa.  LEFT: - There is no evidence of deep vein thrombosis in the lower extremity. However, portions of this examination were limited- see technologist comments above.  - No cystic structure found in the popliteal fossa.  *See table(s) above for measurements and observations. Electronically signed by Orlie Pollen on 02/24/2021 at 2:34:31 PM.    Final         Scheduled Meds:  atorvastatin  80 mg Oral Daily   clopidogrel  75 mg Oral Daily   furosemide  40 mg Intravenous BID   heparin  5,000 Units Subcutaneous Q8H   insulin aspart  0-15 Units Subcutaneous TID WC   insulin glargine-yfgn  10 Units Subcutaneous QHS   pramipexole  1.5 mg Oral QHS   sodium  chloride flush  3 mL Intravenous Q12H   vancomycin variable dose per unstable renal function (pharmacist dosing)   Does not apply See admin instructions   Continuous Infusions:  ceFEPime (MAXIPIME) IV 2 g (02/24/21 1002)    Assessment & Plan:   Principal Problem:   Cellulitis Active Problems:   Acute CHF (congestive heart failure) (HCC)   History of CVA (cerebrovascular accident)   Hyperkalemia   Acute kidney injury superimposed on chronic kidney disease (HCC)   Normocytic anemia   Cellulitis of LE: Acute Continue abx , but will change combo of vanco and zosyn with his current AKI to prevent worsening of renal function. Will start cefepime.  CRP elevated Venous ultrasound negative for DVT bilateral Wound care consulted 1/9 blood cultures pending Continue with IV antibiotics   A/c combined systolic and diastolic heart failure/lower extremity edema Echo with EF 40 to 45% no significant difference from his previous echo last year.  Grade 2 diastolic dysfunction BNP normal 1/9 clinically improving  continue IV antibiotics I's and O's, daily weight Will need to follow-up with CHF clinic as outpatient     AKI on CKD stage IIIb Creatinine worsening It could be could be due to heart failure exacerbation causing hypoperfusion  or just progression of dz.  1/9 nephrology's input was appreciated Good response to diuretics we will continue as he is still volume overloaded Renal ultrasound without obstruction Renal diet with 1.2 L fluid restriction Avoid nephrotoxic medications including NSAIDs  Hyperkalemia K 6.5>>>5.6 1/9K improving Placed on Lokelma 10 GM p.o. twice daily Continue to monitor levels    Diabetes mellitus type 2 BG stable  continue insulin R ISS   History of CVA Continue Plavix and  statin  Essential hypertension Controlled Hold lisinopril due to hyperkalemia  Status post f mechanical all/debility Will obtain PT OT TOC for home health/need of  rehab  Non-anion gap metabolic acidosis Possibly due to renal dysfunction May need bicarb but will defer to nephrology  Normocytic anemia H&H stable   DVT prophylaxis: Heparin Code Status: Full Family Communication: None at bedside Disposition Plan: TBD Status is: Inpatient  Remains inpatient appropriate because: IV treatment.  Needs work-up for his AKI             LOS: 2 days   Time spent: 35 minutes with more than 50% on [place], MD [facility] Pager [phone]  If 7PM-7AM, please contact night-coverage 02/25/2021, 8:56 AM

## 2021-02-25 NOTE — Evaluation (Signed)
Occupational Therapy Evaluation Patient Details Name: James [name] MRN: [id number] DOB: 30-Aug-1945 Today's Date: 02/25/2021   History of Present Illness Pt is a 76 y.o. male admitted 02/23/21 with BLE weeping and swelling, as well as fall sustaining abrasion to buttocks and LUE. Workup for BLE cellulitis, CHF, AKI on CKD 3. PMH includes HTN, HLD, CHF, CAD, CVA, AAA, CKD.   Clinical Impression   Pt reports independence with ADLs at baseline, uses RW for functional mobility around home. Pt has daughter and grandchildren available for PRN assistance at d/c. Pt currently  min A for ADLs overall, min A- min guard for bed mobility and min A for transfers. Began AE education as pt expressed difficulty with donning LB clothing, benefits from use of sock aid. Pt VSS, monitor reading low SpO2, however when taken with pulse ox, reading 97% before and after session. Pt presenting with impairments listed below, will continue to follow acutely. Recommend d/c home with HHOT.     Recommendations for follow up therapy are one component of a multi-disciplinary discharge planning process, led by the attending physician.  Recommendations may be updated based on patient status, additional functional criteria and insurance authorization.   Follow Up Recommendations  Home health OT    Assistance Recommended at Discharge Intermittent Supervision/Assistance  Patient can return home with the following A little help with walking and/or transfers;A little help with bathing/dressing/bathroom;Assistance with cooking/housework    Functional Status Assessment  Patient has had a recent decline in their functional status and demonstrates the ability to make significant improvements in function in a reasonable and predictable amount of time.  Equipment Recommendations  Other (comment) (Sock aid)    Recommendations for Other Services PT consult     Precautions / Restrictions Precautions Precautions:  Fall Restrictions Weight Bearing Restrictions: No      Mobility Bed Mobility Overal bed mobility: Needs Assistance Bed Mobility: Supine to Sit;Sit to Supine     Supine to sit: Min assist;HOB elevated Sit to supine: Min guard   General bed mobility comments: MinA for HHA to elevate trunk    Transfers Overall transfer level: Needs assistance Equipment used: Rolling walker (2 wheels) Transfers: Sit to/from Stand Sit to Stand: Min assist           General transfer comment: min A from lower surfaces (i.e. commode)      Balance Overall balance assessment: Needs assistance   Sitting balance-Leahy Scale: Fair     Standing balance support: Reliant on assistive device for balance;During functional activity Standing balance-Leahy Scale: Poor                             ADL either performed or assessed with clinical judgement   ADL Overall ADL's : Needs assistance/impaired Eating/Feeding: Set up;Sitting   Grooming: Set up;Sitting   Upper Body Bathing: Minimal assistance;Sitting   Lower Body Bathing: Moderate assistance;Sitting/lateral leans   Upper Body Dressing : Minimal assistance;Sitting   Lower Body Dressing: Moderate assistance;Sitting/lateral leans;Cueing for compensatory techniques Lower Body Dressing Details (indicate cue type and reason): dons socks with sock aid sitting EOB Toilet Transfer: Rolling walker (2 wheels);Ambulation;Regular Toilet;Minimal assistance   Toileting- Clothing Manipulation and Hygiene: Supervision/safety;Sitting/lateral lean Toileting - Clothing Manipulation Details (indicate cue type and reason): pt able to move clothing/manipuate catheter when sitting on commode     Functional mobility during ADLs: Minimal assistance       Vision   Vision Assessment?: No apparent visual  deficits     Perception     Praxis      Pertinent Vitals/Pain Pain Assessment: Faces Pain Score: 6  Faces Pain Scale: Hurts even more Pain  Location: BLEs Pain Descriptors / Indicators: Sore Pain Intervention(s): Limited activity within patient's tolerance;Monitored during session;Repositioned     Hand Dominance     Extremity/Trunk Assessment Upper Extremity Assessment Upper Extremity Assessment: Overall WFL for tasks assessed   Lower Extremity Assessment Lower Extremity Assessment: Defer to PT evaluation   Cervical / Trunk Assessment Cervical / Trunk Assessment: Kyphotic   Communication Communication Communication: No difficulties   Cognition Arousal/Alertness: Awake/alert Behavior During Therapy: WFL for tasks assessed/performed Overall Cognitive Status: Within Functional Limits for tasks assessed                                 General Comments: WFL for simple tasks, although question some decreased insight/awareness     General Comments  SpO2 reading low 80's on monitor with poor wavelength, when checked with pulse ox reading 97% before and after toilet transfer    Exercises     Shoulder Instructions      Home Living Family/patient expects to be discharged to:: Private residence Living Arrangements: Alone Available Help at Discharge: Family;Available PRN/intermittently;Other (Comment) (daughter and grandson live nearby) Type of Home: House Home Access: Stairs to enter Secretary/administratorntrance Stairs-Number of Steps: 4 Entrance Stairs-Rails: Right Home Layout: Two level;Able to live on main level with bedroom/bathroom;Full bath on main level     Bathroom Shower/Tub: Producer, television/film/videoWalk-in shower   Bathroom Toilet: Handicapped height Bathroom Accessibility: Yes   Home Equipment: Rollator (4 wheels);Cane - single point;Grab bars - tub/shower;Shower seat          Prior Functioning/Environment Prior Level of Function : Independent/Modified Independent;Driving;History of Falls (last six months)               ADLs Comments: Reports independent for DME, stands to shower; has difficulty reaching feet so  typically goes barefoot. Organized medications mailed to him        OT Problem List: Decreased strength;Decreased range of motion;Impaired balance (sitting and/or standing);Decreased activity tolerance;Decreased safety awareness;Decreased knowledge of use of DME or AE;Pain      OT Treatment/Interventions: Self-care/ADL training;Therapeutic exercise;Energy conservation;DME and/or AE instruction;Therapeutic activities;Patient/family education;Balance training    OT Goals(Current goals can be found in the care plan section) Acute Rehab OT Goals Patient Stated Goal: to go home OT Goal Formulation: With patient Time For Goal Achievement: 03/11/21 Potential to Achieve Goals: Good ADL Goals Pt Will Perform Upper Body Dressing: with min guard assist;sitting Pt Will Perform Lower Body Dressing: with min guard assist;with adaptive equipment;sitting/lateral leans Pt Will Transfer to Toilet: with supervision;regular height toilet;ambulating Pt Will Perform Tub/Shower Transfer: rolling walker;shower seat;ambulating;Shower transfer;with min guard assist  OT Frequency: Min 2X/week    Co-evaluation              AM-PAC OT "6 Clicks" Daily Activity     Outcome Measure Help from another person eating meals?: None Help from another person taking care of personal grooming?: A Little Help from another person toileting, which includes using toliet, bedpan, or urinal?: A Little Help from another person bathing (including washing, rinsing, drying)?: A Lot Help from another person to put on and taking off regular upper body clothing?: A Little Help from another person to put on and taking off regular lower body clothing?: A Lot 6 Click  Score: 17   End of Session Equipment Utilized During Treatment: Gait belt;Rolling walker (2 wheels) Nurse Communication: Mobility status;Other (comment) (notified pt's catheter came out)  Activity Tolerance: Patient tolerated treatment well Patient left: in bed;with  call bell/phone within reach;with bed alarm set  OT Visit Diagnosis: Unsteadiness on feet (R26.81);Muscle weakness (generalized) (M62.81)                Time: 9675-9163 OT Time Calculation (min): 30 min Charges:  OT General Charges $OT Visit: 1 Visit OT Evaluation $OT Eval Low Complexity: 1 Low OT Treatments $Self Care/Home Management : 8-22 mins  [name], OTD, OTR/L Acute Rehab [phone]   [name] 02/25/2021, 8:39 AM

## 2021-02-25 NOTE — Progress Notes (Signed)
Pharmacy Antibiotic Note  James [name] is a 76 y.o. male admitted on 02/23/2021 with cellulitis.  Pharmacy has been consulted for vancomycin dosing. He is also on cefepime -day 3 of antibiotics -cultures: ngtd  -SCr= 2.98 (BL ~1.3-1.5)  Plan: -Vancomycin 1250 mg IV q48hr -Monitor CBC, renal fx, cultures and clinical progress  Height: 5\' 9"  (175.3 cm) Weight: 110.9 kg (244 lb 7.8 oz) IBW/kg (Calculated) : 70.7  Temp (24hrs), Avg:97.9 F (36.6 C), Min:97.3 F (36.3 C), Max:98.2 F (36.8 C)  Recent Labs  Lab 02/23/21 0303 02/23/21 0644 02/23/21 0942 02/23/21 2012 02/24/21 0315 02/25/21 0325  WBC 12.7*  --   --   --   --   --   CREATININE 3.14*  --  2.92* 3.01* 3.08* 2.98*  LATICACIDVEN  --  1.0  --   --   --   --      Estimated Creatinine Clearance: 26.3 mL/min (A) (by C-G formula based on SCr of 2.98 mg/dL (H)).    No Active Allergies   Antimicrobials this admission: Cefepime 1/8>> Zosyn 1/7 >> 1/8 Vancomycin 1/7 >>   Dose adjustments this admission:  Microbiology results: 1/7 BCx: ngtd   Thank you for allowing pharmacy to be a part of this patients care.  3/7, PharmD Clinical Pharmacist **Pharmacist phone directory can now be found on [email or web address] ([email or web address]).  Listed under [facility].

## 2021-02-25 NOTE — Progress Notes (Signed)
[facility] Progress Note    Assessment/ Plan:   Assessment/Plan: Acute on CKD3a? Creatinine was in the 1.3-1.6 range in 11/2019 and then next labs are this admission so unclear if his renal disease progressed. We will need to find out if he has any chemistry panels at his PCP [facility] in [place]- awaiting - Cr stable today from yesterday _ K improving - Good response to diuretics and at this point would continue as he is volume overloaded. Acute component suspicious for CRS.  -  no obstruction on US - Renal diet with 1.2L fluid restriction   -Monitor Daily I/Os, Daily weight  -Maintain MAP>65 for optimal renal perfusion.  -Avoid nephrotoxic medications including NSAIDs   Hyperkalemia secondary to the renal dysfunction + potassium replacement + diet (bananas) at home. - Continued response to diuretics will help - Lokelma 10gm PO BID, K 5.2 this AM  HFrEF - Echo shows EF 40-45%; on diuretics at this time. Cellulitis DM - per primary HTN - Controlled Anemia - will follow for now, likely has a component of anemia from chronic disease/ inflammatory state. Transfuse as needed, would not given IV iron with possible cellulitis and no ESA indicated.  Subjective:    Sitting up sleeping in chair, Cr stabilized out, K down, good UOP.  Vascular US without DVT.     Objective:   BP (!) 109/54 (BP Location: Left Arm)    Pulse 69    Temp (!) 97.3 F (36.3 C) (Axillary)    Resp 16    Ht 5\' 9"  (1.753 m)    Wt 110.9 kg    SpO2 93%    BMI 36.10 kg/m   Intake/Output Summary (Last 24 hours) at 02/25/2021 1419 Last data filed at 02/25/2021 0343 Gross per 24 hour  Intake 100 ml  Output 1550 ml  Net -1450 ml   Weight change: -1 kg  Physical Exam: Gen: sleeping,easily arousable CVS: RRR Resp: coarse bilaterally Abd: soft Ext: 1+ LE edema, wrapped  Imaging: US RENAL  Result Date: 02/24/2021 CLINICAL DATA:  Acute kidney injury. EXAM: RENAL / URINARY TRACT ULTRASOUND COMPLETE  COMPARISON:  CT, 07/24/2017. FINDINGS: Right Kidney: Renal measurements: 10.4 x 4.7 x 5.1 cm = volume: 132 mL. Echogenicity within normal limits. No mass or hydronephrosis visualized. Left Kidney: Renal measurements: 10.8 x 5.6 x 5.1 cm = volume: 161 mL. Normal parenchymal echogenicity. Cyst arises from the midpole measuring 4.6 x 4.2 x 4.3 cm. No other masses, no stones and no hydronephrosis. Bladder: Appears normal for degree of bladder distention. Other: None. IMPRESSION: 1. No acute findings.  No hydronephrosis. 2. 4.6 cm simple appearing left midpole renal cyst. No other abnormalities. Electronically Signed   By: Lajean Manes M.D.   On: 02/24/2021 15:26   ECHOCARDIOGRAM COMPLETE  Result Date: 02/24/2021    ECHOCARDIOGRAM REPORT   Patient Name:   James [name] Date of Exam: 02/24/2021 Medical Rec #:  [id number]     Height:       69.0 in Accession #:    [id number]    Weight:       242.5 lb Date of Birth:  [birth year]     BSA:          2.242 m Patient Age:    76 years      BP:           127/64 mmHg Patient Gender: M             HR:  72 bpm. Exam Location:  Inpatient Procedure: 2D Echo, Cardiac Doppler and Color Doppler Indications:    CHF-Acute systolic  History:        Patient has prior history of Echocardiogram examinations. CHF,                 CAD, Prior CABG; Risk Factors:Hypertension, Diabetes and                 Dyslipidemia. AAA.  Sonographer:    Clayton Lefort RDCS (AE) Referring Phys: [id number] RONDELL A SMITH IMPRESSIONS  1. Technically difficult due to poor sound wave transmission; probable mild LV dysfunction with EF 45; consider cardiac MRI to more definitively define LV function if clinicalliy indicated.  2. Left ventricular ejection fraction, by estimation, is 40 to 45%. The left ventricle has mildly decreased function. The left ventricle demonstrates global hypokinesis. Left ventricular diastolic parameters are consistent with Grade II diastolic dysfunction (pseudonormalization). Elevated left  atrial pressure.  3. Right ventricular systolic function is normal. The right ventricular size is mildly enlarged.  4. Right atrial size was mildly dilated.  5. The mitral valve is normal in structure. Trivial mitral valve regurgitation. No evidence of mitral stenosis.  6. The aortic valve is tricuspid. Aortic valve regurgitation is not visualized. Aortic valve sclerosis is present, with no evidence of aortic valve stenosis.  7. The inferior vena cava is normal in size with greater than 50% respiratory variability, suggesting right atrial pressure of 3 mmHg. FINDINGS  Left Ventricle: Left ventricular ejection fraction, by estimation, is 40 to 45%. The left ventricle has mildly decreased function. The left ventricle demonstrates global hypokinesis. The left ventricular internal cavity size was normal in size. There is  no left ventricular hypertrophy. Left ventricular diastolic parameters are consistent with Grade II diastolic dysfunction (pseudonormalization). Elevated left atrial pressure. Right Ventricle: The right ventricular size is mildly enlarged. Right ventricular systolic function is normal. Left Atrium: Left atrial size was normal in size. Right Atrium: Right atrial size was mildly dilated. Pericardium: There is no evidence of pericardial effusion. Mitral Valve: The mitral valve is normal in structure. Trivial mitral valve regurgitation. No evidence of mitral valve stenosis. Tricuspid Valve: The tricuspid valve is normal in structure. Tricuspid valve regurgitation is trivial. No evidence of tricuspid stenosis. Aortic Valve: The aortic valve is tricuspid. Aortic valve regurgitation is not visualized. Aortic valve sclerosis is present, with no evidence of aortic valve stenosis. Aortic valve mean gradient measures 3.0 mmHg. Aortic valve peak gradient measures 5.7  mmHg. Aortic valve area, by VTI measures 3.98 cm. Pulmonic Valve: The pulmonic valve was not well visualized. Pulmonic valve regurgitation is mild.  No evidence of pulmonic stenosis. Aorta: The aortic root is normal in size and structure. Venous: The inferior vena cava is normal in size with greater than 50% respiratory variability, suggesting right atrial pressure of 3 mmHg. IAS/Shunts: The interatrial septum was not well visualized. Additional Comments: Technically difficult due to poor sound wave transmission; probable mild LV dysfunction with EF 45; consider cardiac MRI to more definitively define LV function if clinicalliy indicated.  LEFT VENTRICLE PLAX 2D LVOT diam:     2.40 cm   Diastology LV SV:         92        LV e' medial:    5.87 cm/s LV SV Index:   41        LV E/e' medial:  17.2 LVOT Area:     4.52 cm  LV e'  lateral:   11.10 cm/s                          LV E/e' lateral: 9.1  RIGHT VENTRICLE             IVC RV Basal diam:  4.20 cm     IVC diam: 1.50 cm RV Mid diam:    3.90 cm RV S prime:     13.10 cm/s TAPSE (M-mode): 1.8 cm LEFT ATRIUM             Index        RIGHT ATRIUM           Index LA Vol (A2C):   49.1 ml 21.90 ml/m  RA Area:     31.30 cm LA Vol (A4C):   67.3 ml 30.01 ml/m  RA Volume:   101.00 ml 45.04 ml/m LA Biplane Vol: 59.3 ml 26.45 ml/m  AORTIC VALVE AV Area (Vmax):    4.30 cm AV Area (Vmean):   3.94 cm AV Area (VTI):     3.98 cm AV Vmax:           119.00 cm/s AV Vmean:          82.400 cm/s AV VTI:            0.232 m AV Peak Grad:      5.7 mmHg AV Mean Grad:      3.0 mmHg LVOT Vmax:         113.00 cm/s LVOT Vmean:        71.800 cm/s LVOT VTI:          0.204 m LVOT/AV VTI ratio: 0.88  AORTA Ao Root diam: 3.40 cm Ao Asc diam:  3.50 cm MITRAL VALVE MV Area (PHT): 5.66 cm     SHUNTS MV Decel Time: 134 msec     Systemic VTI:  0.20 m MV E velocity: 101.00 cm/s  Systemic Diam: 2.40 cm MV A velocity: 66.60 cm/s MV E/A ratio:  1.52 Kirk Ruths MD Electronically signed by Kirk Ruths MD Signature Date/Time: 02/24/2021/10:00:51 AM    Final    VAS [place] LOWER EXTREMITY VENOUS (DVT)  Result Date: 02/24/2021  Lower Venous DVT Study  Patient Name:  James [name]  Date of Exam:   02/24/2021 Medical Rec #: [id number]      Accession #:    [id number] Date of Birth: [birth year]      Patient Gender: M Patient Age:   11 years Exam Location:  [facility] Procedure:      VAS [place] LOWER EXTREMITY VENOUS (DVT) Referring Phys: Fuller Plan --------------------------------------------------------------------------------  Indications: Swelling.  Limitations: Bandages. Comparison       07-25-2017 Bilateral lower extremity venous was negative for Study:           DVT. Performing Technologist: Darlin Coco RDMS, RVT  Examination Guidelines: A complete evaluation includes B-mode imaging, spectral Doppler, color Doppler, and power Doppler as needed of all accessible portions of each vessel. Bilateral testing is considered an integral part of a complete examination. Limited examinations for reoccurring indications may be performed as noted. The reflux portion of the exam is performed with the patient in reverse Trendelenburg.  +---------+---------------+---------+-----------+----------+-------------------+  RIGHT     Compressibility Phasicity Spontaneity Properties Thrombus Aging       +---------+---------------+---------+-----------+----------+-------------------+  CFV       Full            Yes  Yes                                         +---------+---------------+---------+-----------+----------+-------------------+  SFJ       Full                                                                  +---------+---------------+---------+-----------+----------+-------------------+  FV Prox   Full                                                                  +---------+---------------+---------+-----------+----------+-------------------+  FV Mid    Full                                                                  +---------+---------------+---------+-----------+----------+-------------------+  FV Distal Full                                                                   +---------+---------------+---------+-----------+----------+-------------------+  PFV       Full                                                                  +---------+---------------+---------+-----------+----------+-------------------+  POP       Full            Yes       Yes                                         +---------+---------------+---------+-----------+----------+-------------------+  PTV                                                        Unable to visualize                                                              due to  bandaging     +---------+---------------+---------+-----------+----------+-------------------+  PERO                                                       Unable to visualize                                                              due to bandaging     +---------+---------------+---------+-----------+----------+-------------------+   +---------+---------------+---------+-----------+----------+-------------------+  LEFT      Compressibility Phasicity Spontaneity Properties Thrombus Aging       +---------+---------------+---------+-----------+----------+-------------------+  CFV       Full            Yes       Yes                                         +---------+---------------+---------+-----------+----------+-------------------+  SFJ       Full                                                                  +---------+---------------+---------+-----------+----------+-------------------+  FV Prox   Full                                                                  +---------+---------------+---------+-----------+----------+-------------------+  FV Mid    Full                                                                  +---------+---------------+---------+-----------+----------+-------------------+  FV Distal Full                                                                   +---------+---------------+---------+-----------+----------+-------------------+  PFV       Full                                                                  +---------+---------------+---------+-----------+----------+-------------------+  POP  Full            Yes       Yes                                         +---------+---------------+---------+-----------+----------+-------------------+  PTV                                                        Unable to visualize                                                              due to bandaging     +---------+---------------+---------+-----------+----------+-------------------+  PERO                                                       Unable to visualize                                                              due to bandaging     +---------+---------------+---------+-----------+----------+-------------------+     Summary: RIGHT: - There is no evidence of deep vein thrombosis in the lower extremity. However, portions of this examination were limited- see technologist comments above.  - No cystic structure found in the popliteal fossa.  LEFT: - There is no evidence of deep vein thrombosis in the lower extremity. However, portions of this examination were limited- see technologist comments above.  - No cystic structure found in the popliteal fossa.  *See table(s) above for measurements and observations. Electronically signed by Orlie Pollen on 02/24/2021 at 2:34:31 PM.    Final     Labs: BMET Recent Labs  Lab 02/23/21 0303 02/23/21 0942 02/23/21 2012 02/24/21 0315 02/25/21 0325  NA 137 135 137 136 135  K 6.5* 6.4* 5.8* 5.6* 5.2*  CL 109 109 108 108 105  CO2 19* 16* 20* 19* 21*  GLUCOSE 139* 149* 192* 112* 92  BUN 79* 77* 74* 73* 79*  CREATININE 3.14* 2.92* 3.01* 3.08* 2.98*  CALCIUM 8.7* 8.7* 8.4* 8.5* 8.6*   CBC Recent Labs  Lab 02/23/21 0303 02/23/21 0744  WBC 12.7*  --   NEUTROABS  --  7.9*  HGB 12.9*  --   HCT 40.0  --    MCV 91.1  --   PLT 327  --     Medications:     atorvastatin  80 mg Oral Daily   clopidogrel  75 mg Oral Daily   furosemide  40 mg Intravenous BID   heparin  5,000 Units Subcutaneous Q8H   insulin aspart  0-15 Units Subcutaneous TID WC   insulin glargine-yfgn  10 Units Subcutaneous QHS   pramipexole  1.5 mg Oral QHS   sodium chloride flush  3 mL Intravenous Q12H   vancomycin variable dose per unstable renal function (pharmacist dosing)   Does not apply See admin instructions      Madelon Lips, MD 02/25/2021, 2:19 PM

## 2021-02-25 NOTE — Progress Notes (Signed)
PT Cancellation Note  Patient Details Name: James [name] MRN: [id number] DOB: Dec 07, 1945   Cancelled Treatment:    Reason Eval/Treat Not Completed: (P) Medical issues which prohibited therapy Pt reports he has a headache and request PT follow back later. PT will follow back for treatment this afternoon as able.  Tobechukwu Emmick B. Beverely Risen PT, DPT Acute Rehabilitation Services Pager [phone] Office [phone]    James [name] Fleet 02/25/2021, 9:39 AM

## 2021-02-25 NOTE — Plan of Care (Signed)
  Problem: Education: Goal: Knowledge of General Education information will improve Description Including pain rating scale, medication(s)/side effects and non-pharmacologic comfort measures Outcome: Progressing   

## 2021-02-25 NOTE — Progress Notes (Signed)
Physical Therapy Treatment Patient Details Name: James [name] MRN: [id number] DOB: 1946/02/16 Today's Date: 02/25/2021   History of Present Illness Pt is a 75 y.o. male admitted 02/23/21 with BLE weeping and swelling, as well as fall sustaining abrasion to buttocks and LUE. Workup for BLE cellulitis, CHF, AKI on CKD 3. PMH includes HTN, HLD, CHF, CAD, CVA, AAA, CKD.    PT Comments    Pt reports headache is gone and is agreeable to using Rollator to ambulate in the hallway. Pt limited in safe mobility by decreased strength and endurance. Pt min guard for bed mobility, transfers and ambulation with Rollator. Pt requires 2x standing rest breaks due to 3/4 DoE. Discharge plans remain appropriate. PT will continue to follow acutely.     Recommendations for follow up therapy are one component of a multi-disciplinary discharge planning process, led by the attending physician.  Recommendations may be updated based on patient status, additional functional criteria and insurance authorization.  Follow Up Recommendations  Home health PT     Assistance Recommended at Discharge Intermittent Supervision/Assistance  Patient can return home with the following Help with stairs or ramp for entrance;Assistance with cooking/housework;Assist for transportation   Equipment Recommendations  None recommended by PT       Precautions / Restrictions Precautions Precautions: Fall Restrictions Weight Bearing Restrictions: No     Mobility  Bed Mobility Overal bed mobility: Needs Assistance Bed Mobility: Supine to Sit     Supine to sit: HOB elevated;Min guard     General bed mobility comments: increased time and effort but pt ultimately able to come to seated EoB without assist.    Transfers Overall transfer level: Needs assistance Equipment used: Rolling walker (2 wheels) Transfers: Sit to/from Stand Sit to Stand: Min guard           General transfer comment: Min guard to power into standing  from elevated position, reports he uses lift chair at home. vc for brake engagement prior to power up    Ambulation/Gait Ambulation/Gait assistance: Min guard Gait Distance (Feet): 60 Feet Assistive device: Rolling walker (2 wheels) Gait Pattern/deviations: Step-through pattern;Decreased stride length;Trunk flexed Gait velocity: Decreased Gait velocity interpretation: <1.31 ft/sec, indicative of household ambulator   General Gait Details: initially ambulates with strong, steady gait, fatigues quickly and requires standing rest break, refuses to sit on Rollator to rest, requires 1 more time standing rest break prior to ambulation back in room to recliner       Balance Overall balance assessment: Needs assistance   Sitting balance-Leahy Scale: Fair     Standing balance support: Reliant on assistive device for balance;During functional activity Standing balance-Leahy Scale: Poor                              Cognition Arousal/Alertness: Awake/alert Behavior During Therapy: WFL for tasks assessed/performed Overall Cognitive Status: Within Functional Limits for tasks assessed                                 General Comments: WFL for simple tasks, although question some decreased insight/awareness           General Comments General comments (skin integrity, edema, etc.): VSS on RA, bandages on bilateral calves with minimal drainage present      Pertinent Vitals/Pain Pain Assessment: Faces Faces Pain Scale: Hurts a little bit Pain Location: BLEs Pain Descriptors /  Indicators: Sore Pain Intervention(s): Limited activity within patient's tolerance;Monitored during session;Repositioned     PT Goals (current goals can now be found in the care plan section) Acute Rehab PT Goals Patient Stated Goal: Adamant about return home; had horrible prior experience at SNF PT Goal Formulation: With patient Time For Goal Achievement: 03/10/21 Potential to Achieve  Goals: Good Progress towards PT goals: Progressing toward goals    Frequency    Min 3X/week      PT Plan Current plan remains appropriate       AM-PAC PT "6 Clicks" Mobility   Outcome Measure  Help needed turning from your back to your side while in a flat bed without using bedrails?: None Help needed moving from lying on your back to sitting on the side of a flat bed without using bedrails?: A Little Help needed moving to and from a bed to a chair (including a wheelchair)?: A Little Help needed standing up from a chair using your arms (e.g., wheelchair or bedside chair)?: A Little Help needed to walk in hospital room?: A Little Help needed climbing 3-5 steps with a railing? : A Little 6 Click Score: 19    End of Session Equipment Utilized During Treatment: Gait belt Activity Tolerance: Patient tolerated treatment well Patient left: in chair;with call bell/phone within reach;with chair alarm set Nurse Communication: Mobility status PT Visit Diagnosis: Other abnormalities of gait and mobility (R26.89);Muscle weakness (generalized) (M62.81)     Time: [id number] PT Time Calculation (min) (ACUTE ONLY): 22 min  Charges:  $Gait Training: 8-22 mins                     Navina Wohlers B. Migdalia Dk PT, DPT Acute Rehabilitation Services Pager [phone] Office [phone]    [place] 02/25/2021, 4:50 PM

## 2021-02-26 LAB — BASIC METABOLIC PANEL
Anion gap: 13 (ref 5–15)
BUN: 88 mg/dL — ABNORMAL HIGH (ref 8–23)
CO2: 18 mmol/L — ABNORMAL LOW (ref 22–32)
Calcium: 8.2 mg/dL — ABNORMAL LOW (ref 8.9–10.3)
Chloride: 101 mmol/L (ref 98–111)
Creatinine, Ser: 3.07 mg/dL — ABNORMAL HIGH (ref 0.61–1.24)
GFR, Estimated: 20 mL/min — ABNORMAL LOW (ref >60)
Glucose, Bld: 112 mg/dL — ABNORMAL HIGH (ref 70–99)
Potassium: 4.7 mmol/L (ref 3.5–5.1)
Sodium: 132 mmol/L — ABNORMAL LOW (ref 135–145)

## 2021-02-26 LAB — CBC
HCT: 36.1 % — ABNORMAL LOW (ref 39.0–52.0)
Hemoglobin: 12 g/dL — ABNORMAL LOW (ref 13.0–17.0)
MCH: 29.1 pg (ref 26.0–34.0)
MCHC: 33.2 g/dL (ref 30.0–36.0)
MCV: 87.6 fL (ref 80.0–100.0)
Platelets: 281 10*3/uL (ref 150–400)
RBC: 4.12 MIL/uL — ABNORMAL LOW (ref 4.22–5.81)
RDW: 12.4 % (ref 11.5–15.5)
WBC: 8.9 10*3/uL (ref 4.0–10.5)
nRBC: 0 % (ref 0.0–0.2)

## 2021-02-26 LAB — GLUCOSE, CAPILLARY
Glucose-Capillary: 110 mg/dL — ABNORMAL HIGH (ref 70–99)
Glucose-Capillary: 164 mg/dL — ABNORMAL HIGH (ref 70–99)
Glucose-Capillary: 120 mg/dL — ABNORMAL HIGH (ref 70–99)
Glucose-Capillary: 154 mg/dL — ABNORMAL HIGH (ref 70–99)

## 2021-02-26 MED ORDER — SODIUM ZIRCONIUM CYCLOSILICATE 10 G PO PACK
10.0000 g | PACK | Freq: Two times a day (BID) | ORAL | Status: DC
  Administered 2021-02-26 (×2): 10 g via ORAL
  Filled 2021-02-26 (×3): qty 1

## 2021-02-26 NOTE — Progress Notes (Signed)
James [name] Progress Note    Assessment/ Plan:   Assessment/Plan: Acute on CKD3a? Creatinine was in the 1.3-1.6 range in 11/2019 and then next labs are this admission so unclear if his renal disease progressed. We will need to find out if he has any chemistry panels at his PCP [facility] in [place]- awaiting - Cr overall stable for the last couple of days- 2.9-3.0 _ K improving - Good response to diuretics and at this point would continue as he is volume overloaded--> Lasix 40 IV BID -  no obstruction on US - Renal diet with 1.2L fluid restriction   -Monitor Daily I/Os, Daily weight  -Maintain MAP>65 for optimal renal perfusion.  -Avoid nephrotoxic medications including NSAIDs   Hyperkalemia secondary to the renal dysfunction + potassium replacement + diet (bananas) at home. - Continued response to diuretics will help - Lokelma 10gm PO BID, improved  HFrEF - Echo shows EF 40-45%; on diuretics at this time. Cellulitis- on antibiotics per primary DM - per primary HTN - Controlled Anemia - will follow for now, likely has a component of anemia from chronic disease/ inflammatory state. Transfuse as needed, would not given IV iron with possible cellulitis and no ESA indicated.  Subjective:    Cr fluctuating- Cr 3.07 today.  No complaints.  Overall feeling OK.       Objective:   BP 122/73 (BP Location: Left Arm)    Pulse 62    Temp 98 F (36.7 C) (Oral)    Resp 18    Ht 5\' 9"  (1.753 m)    Wt 110.9 kg    SpO2 95%    BMI 36.10 kg/m   Intake/Output Summary (Last 24 hours) at 02/26/2021 1356 Last data filed at 02/26/2021 1344 Gross per 24 hour  Intake 350.09 ml  Output 1250 ml  Net -899.91 ml   Weight change:   Physical Exam: Gen: sleeping,easily arousable CVS: RRR Resp: coarse bilaterally Abd: soft Ext: 1+ LE edema, wrapped  Imaging: 04/26/2021 RENAL  Result Date: 02/24/2021 CLINICAL DATA:  Acute kidney injury. EXAM: RENAL / URINARY TRACT ULTRASOUND COMPLETE COMPARISON:   CT, 07/24/2017. FINDINGS: Right Kidney: Renal measurements: 10.4 x 4.7 x 5.1 cm = volume: 132 mL. Echogenicity within normal limits. No mass or hydronephrosis visualized. Left Kidney: Renal measurements: 10.8 x 5.6 x 5.1 cm = volume: 161 mL. Normal parenchymal echogenicity. Cyst arises from the midpole measuring 4.6 x 4.2 x 4.3 cm. No other masses, no stones and no hydronephrosis. Bladder: Appears normal for degree of bladder distention. Other: None. IMPRESSION: 1. No acute findings.  No hydronephrosis. 2. 4.6 cm simple appearing left midpole renal cyst. No other abnormalities. Electronically Signed   By: 09/23/2017 M.D.   On: 02/24/2021 15:26    Labs: BMET Recent Labs  Lab 02/23/21 0303 02/23/21 0942 02/23/21 2012 02/24/21 0315 02/25/21 0325 02/26/21 1041  NA 137 135 137 136 135 132*  K 6.5* 6.4* 5.8* 5.6* 5.2* 4.7  CL 109 109 108 108 105 101  CO2 19* 16* 20* 19* 21* 18*  GLUCOSE 139* 149* 192* 112* 92 112*  BUN 79* 77* 74* 73* 79* 88*  CREATININE 3.14* 2.92* 3.01* 3.08* 2.98* 3.07*  CALCIUM 8.7* 8.7* 8.4* 8.5* 8.6* 8.2*   CBC Recent Labs  Lab 02/23/21 0303 02/23/21 0744 02/26/21 1041  WBC 12.7*  --  8.9  NEUTROABS  --  7.9*  --   HGB 12.9*  --  12.0*  HCT 40.0  --  36.1*  MCV 91.1  --  87.6  PLT 327  --  281    Medications:     atorvastatin  80 mg Oral Daily   clopidogrel  75 mg Oral Daily   furosemide  40 mg Intravenous BID   heparin  5,000 Units Subcutaneous Q8H   insulin aspart  0-15 Units Subcutaneous TID WC   insulin glargine-yfgn  10 Units Subcutaneous QHS   pramipexole  1.5 mg Oral QHS   sodium chloride flush  3 mL Intravenous Q12H   sodium zirconium cyclosilicate  10 g Oral BID   vancomycin variable dose per unstable renal function (pharmacist dosing)   Does not apply See admin instructions      Bufford Buttner, MD 02/26/2021, 1:56 PM

## 2021-02-26 NOTE — Progress Notes (Signed)
PROGRESS NOTE    Vytautas Barbieri [facility]  [id number] DOB: 1945-08-20 DOA: 02/23/2021 PCP: Lyman Bishop, DO    Brief Narrative:   James [name] is a 76 y.o. male with medical history significant of hypertension, hyperlipidemia, systolic congestive heart failure, CAD, CVA, AAA, and CKD presents with complaints of his legs weeping over the last 2 weeks.  He noted that his orders were covered in the drainage from his wounds and noted that there was a foul odor to it.  Patient reports that about 6 months ago his PCP at decrease his torsemide from 20 mg daily to 10 mg due to him having urinary frequency and reporting being in the bathroom all day.  Patient's daughter was able to get him to his primary care office yesterday where they recommended him to increase back to torsemide 20 mg daily due to the leg swelling, suspected him to have signs of cellulitis of his lower extremities for which they had prescribed Augmentin, and performed blood work.  After getting back home patient reportedly lost his balance and fell on his bottom sustaining an abrasion to his left arm.  He states that he did not hit his head and called EMS who came to the house.   While here had to consult nephrology as renal function worsening. Had issues with hyperkalemia. Being treated for CHF and cellulitis.    Consultants:   Nephrology Procedures:   Antimicrobials:  Vanco 02/23/2021....> Cefepime 02/24/2021...>  zosyn ...>dc'd 1/8   Subjective: Will like to sit in the chair.  Less short of breath.  No chest pain.  No dizziness.  Objective: Vitals:   02/25/21 0518 02/25/21 1353 02/25/21 2100 02/26/21 0345  BP:  (!) 109/54 128/81 122/73  Pulse:  69 62 62  Resp:  16 17 18   Temp:  (!) 97.3 F (36.3 C) (!) 97.5 F (36.4 C) 98 F (36.7 C)  TempSrc:  Axillary Oral Oral  SpO2:  93% 94% 95%  Weight: 110.9 kg     Height:        Intake/Output Summary (Last 24 hours) at 02/26/2021 1028 Last data filed at 02/26/2021  0400 Gross per 24 hour  Intake 350.09 ml  Output 650 ml  Net -299.91 ml   Filed Weights   02/23/21 1927 02/24/21 0500 02/25/21 0518  Weight: 111.9 kg 110 kg 110.9 kg    Examination: NAD, calm lying in bed with head elevated Minimal crackles no wheezing Regular S1-S2 no gallops Soft benign positive bowel sounds Still with lower extremity edema however less, dressings in place Awake oriented x3 Mood and affect appropriate in current setting   Data Reviewed: I have personally reviewed following labs and imaging studies  CBC: Recent Labs  Lab 02/23/21 0303 02/23/21 0744  WBC 12.7*  --   NEUTROABS  --  7.9*  HGB 12.9*  --   HCT 40.0  --   MCV 91.1  --   PLT 327  --    Basic Metabolic Panel: Recent Labs  Lab 02/23/21 0303 02/23/21 0942 02/23/21 2012 02/24/21 0315 02/25/21 0325  NA 137 135 137 136 135  K 6.5* 6.4* 5.8* 5.6* 5.2*  CL 109 109 108 108 105  CO2 19* 16* 20* 19* 21*  GLUCOSE 139* 149* 192* 112* 92  BUN 79* 77* 74* 73* 79*  CREATININE 3.14* 2.92* 3.01* 3.08* 2.98*  CALCIUM 8.7* 8.7* 8.4* 8.5* 8.6*  MG 2.6*  --   --   --   --  GFR: Estimated Creatinine Clearance: 26.3 mL/min (A) (by C-G formula based on SCr of 2.98 mg/dL (H)). Liver Function Tests: Recent Labs  Lab 02/23/21 0303  AST 17  ALT 16  ALKPHOS 118  BILITOT 0.3  PROT 7.6  ALBUMIN 3.6   No results for input(s): LIPASE, AMYLASE in the last 168 hours. No results for input(s): AMMONIA in the last 168 hours. Coagulation Profile: No results for input(s): INR, PROTIME in the last 168 hours. Cardiac Enzymes: Recent Labs  Lab 02/23/21 2012  CKTOTAL 470*   BNP (last 3 results) No results for input(s): PROBNP in the last 8760 hours. HbA1C: Recent Labs    02/23/21 2012  HGBA1C 7.6*   CBG: Recent Labs  Lab 02/25/21 0739 02/25/21 1140 02/25/21 1550 02/25/21 2142 02/26/21 0733  GLUCAP 122* 116* 177* 80 110*   Lipid Profile: No results for input(s): CHOL, HDL, LDLCALC, TRIG,  CHOLHDL, LDLDIRECT in the last 72 hours. Thyroid Function Tests: No results for input(s): TSH, T4TOTAL, FREET4, T3FREE, THYROIDAB in the last 72 hours. Anemia Panel: No results for input(s): VITAMINB12, FOLATE, FERRITIN, TIBC, IRON, RETICCTPCT in the last 72 hours. Sepsis Labs: Recent Labs  Lab 02/23/21 0644  LATICACIDVEN 1.0    Recent Results (from the past 240 hour(s))  Resp Panel by RT-PCR (Flu A&B, Covid) Nasopharyngeal Swab     Status: None   Collection Time: 02/23/21  6:41 AM   Specimen: Nasopharyngeal Swab; Nasopharyngeal(NP) swabs in vial transport medium  Result Value Ref Range Status   SARS Coronavirus 2 by RT PCR NEGATIVE NEGATIVE Final    Comment: (NOTE) SARS-CoV-2 target nucleic acids are NOT DETECTED.  The SARS-CoV-2 RNA is generally detectable in upper respiratory specimens during the acute phase of infection. The lowest concentration of SARS-CoV-2 viral copies this assay can detect is 138 copies/mL. A negative result does not preclude SARS-Cov-2 infection and should not be used as the sole basis for treatment or other patient management decisions. A negative result may occur with  improper specimen collection/handling, submission of specimen other than nasopharyngeal swab, presence of viral mutation(s) within the areas targeted by this assay, and inadequate number of viral copies(<138 copies/mL). A negative result must be combined with clinical observations, patient history, and epidemiological information. The expected result is Negative.  Fact Sheet for Patients:  EntrepreneurPulse.com.au  Fact Sheet for Healthcare Providers:  IncredibleEmployment.be  This test is no t yet approved or cleared by the [place] FDA and  has been authorized for detection and/or diagnosis of SARS-CoV-2 by FDA under an Emergency Use Authorization (EUA). This EUA will remain  in effect (meaning this test can be used) for the duration of  the COVID-19 declaration under Section 564(b)(1) of the Act, 21 U.S.C.section 360bbb-3(b)(1), unless the authorization is terminated  or revoked sooner.       Influenza A by PCR NEGATIVE NEGATIVE Final   Influenza B by PCR NEGATIVE NEGATIVE Final    Comment: (NOTE) The Xpert Xpress SARS-CoV-2/FLU/RSV plus assay is intended as an aid in the diagnosis of influenza from Nasopharyngeal swab specimens and should not be used as a sole basis for treatment. Nasal washings and aspirates are unacceptable for Xpert Xpress SARS-CoV-2/FLU/RSV testing.  Fact Sheet for Patients: EntrepreneurPulse.com.au  Fact Sheet for Healthcare Providers: IncredibleEmployment.be  This test is not yet approved or cleared by the [place] FDA and has been authorized for detection and/or diagnosis of SARS-CoV-2 by FDA under an Emergency Use Authorization (EUA). This EUA will remain in effect (meaning this  test can be used) for the duration of the COVID-19 declaration under Section 564(b)(1) of the Act, 21 U.S.C. section 360bbb-3(b)(1), unless the authorization is terminated or revoked.  Performed at [facility], [address] [place], [place] [address]   Blood culture (routine x 2)     Status: None (Preliminary result)   Collection Time: 02/23/21  7:33 AM   Specimen: BLOOD RIGHT HAND  Result Value Ref Range Status   Specimen Description BLOOD RIGHT HAND  Final   Special Requests   Final    BOTTLES DRAWN AEROBIC AND ANAEROBIC Blood Culture results may not be optimal due to an inadequate volume of blood received in culture bottles   Culture   Final    NO GROWTH 3 DAYS Performed at [facility], [address], [place], [place] [address]    Report Status PENDING  Incomplete  Blood culture (routine x 2)     Status: None (Preliminary result)   Collection Time: 02/23/21  7:44 AM   Specimen: BLOOD  Result Value Ref Range Status   Specimen Description  BLOOD SITE NOT SPECIFIED  Final   Special Requests   Final    BOTTLES DRAWN AEROBIC AND ANAEROBIC Blood Culture adequate volume   Culture   Final    NO GROWTH 3 DAYS Performed at [facility], [name] [place], [place] [address]    Report Status PENDING  Incomplete         Radiology Studies: US RENAL  Result Date: 02/24/2021 CLINICAL DATA:  Acute kidney injury. EXAM: RENAL / URINARY TRACT ULTRASOUND COMPLETE COMPARISON:  CT, 07/24/2017. FINDINGS: Right Kidney: Renal measurements: 10.4 x 4.7 x 5.1 cm = volume: 132 mL. Echogenicity within normal limits. No mass or hydronephrosis visualized. Left Kidney: Renal measurements: 10.8 x 5.6 x 5.1 cm = volume: 161 mL. Normal parenchymal echogenicity. Cyst arises from the midpole measuring 4.6 x 4.2 x 4.3 cm. No other masses, no stones and no hydronephrosis. Bladder: Appears normal for degree of bladder distention. Other: None. IMPRESSION: 1. No acute findings.  No hydronephrosis. 2. 4.6 cm simple appearing left midpole renal cyst. No other abnormalities. Electronically Signed   By: Amie Portland M.D.   On: 02/24/2021 15:26        Scheduled Meds:  atorvastatin  80 mg Oral Daily   clopidogrel  75 mg Oral Daily   furosemide  40 mg Intravenous BID   heparin  5,000 Units Subcutaneous Q8H   insulin aspart  0-15 Units Subcutaneous TID WC   insulin glargine-yfgn  10 Units Subcutaneous QHS   pramipexole  1.5 mg Oral QHS   sodium chloride flush  3 mL Intravenous Q12H   sodium zirconium cyclosilicate  10 g Oral BID   vancomycin variable dose per unstable renal function (pharmacist dosing)   Does not apply See admin instructions   Continuous Infusions:  ceFEPime (MAXIPIME) IV 2 g (02/26/21 0905)   vancomycin 1,250 mg (02/25/21 1621)    Assessment & Plan:   Principal Problem:   Cellulitis Active Problems:   Acute CHF (congestive heart failure) (HCC)   History of CVA (cerebrovascular accident)   Hyperkalemia   Acute kidney injury  superimposed on chronic kidney disease (HCC)   Normocytic anemia   Cellulitis of LE: Acute Continue abx , but will change combo of vanco and zosyn with his current AKI to prevent worsening of renal function.  Was started on cefepime  CRP elevated Venous ultrasound negative for  DVT bilateral Wound care consulted 1/10 continue IV antibiotics Continue dressing changes per protocol Monitor WBC   A/c combined systolic and diastolic heart failure/lower extremity edema Echo with EF 40 to 45% no significant difference from his previous echo last year.  Grade 2 diastolic dysfunction [id number] clinically improving Will continue IV Lasix I's and O's Daily weight Will need to follow-up with cardiology and CHF clinic as outpatient Follow-up a.m. labs today which is pending     AKI on CKD stage IIIb Creatinine worsening It could be could be due to heart failure exacerbation causing hypoperfusion  or just progression of dz.  1/10 nephrology following Had good response to diuretics but still volume overloaded Awaiting today's lab which is pending Renal ultrasound without obstruction Renal diet with 1.2 L fluid restriction Continue IV diuretics Follow-up with nephrology for any further recommendations Avoid nephrotoxic meds including NSAIDs    Hyperkalemia K 6.5>>>5.6>>>5.2 1/10 am labs pending, will f/u Was treated with Lokelma    Diabetes mellitus type 2 BG stable Continue R-ISS  History of CVA Continue Plavix and statin  Essential hypertension Controlled D/c lisinopril due to hyperkalemia  Status post f mechanical all/debility PT/OT rec. HH TOC for home health/need of rehab  Non-anion gap metabolic acidosis Possibly due to renal dysfunction Improving.  Normocytic anemia H&H stable   DVT prophylaxis: Heparin Code Status: Full Family Communication: None at bedside Disposition Plan: TBD Status is: Inpatient  Remains inpatient appropriate because: IV treatment.   Needs work-up for his AKI             LOS: 3 days   Time spent: 35 minutes with more than 50% on [place], MD [facility] Pager [phone]  If 7PM-7AM, please contact night-coverage 02/26/2021, 10:28 AM

## 2021-02-26 NOTE — Progress Notes (Signed)
Mobility Specialist Progress Note    02/26/21 1104  Mobility  Activity Ambulated in hall  Level of Assistance Minimal assist, patient does 75% or more  Assistive Device Four wheel walker  Distance Ambulated (ft) 95 ft  Mobility Ambulated with assistance in hallway  Mobility Response Tolerated fair  Mobility performed by Mobility specialist  Bed Position Chair  $Mobility charge 1 Mobility   Pre-Mobility: 72 HR, 121/52 BP, 97% SpO2 Post-Mobility: 74 HR  Pt received in bed and agreeable. Took x1 standing rest break. Left in chair with call bell in reach.   [facility] Mobility Specialist  M.S. 2C and 6E: [phone] M.S. 4E: [phone]

## 2021-02-27 LAB — BASIC METABOLIC PANEL
Anion gap: 11 (ref 5–15)
BUN: 83 mg/dL — ABNORMAL HIGH (ref 8–23)
CO2: 19 mmol/L — ABNORMAL LOW (ref 22–32)
Calcium: 8.2 mg/dL — ABNORMAL LOW (ref 8.9–10.3)
Chloride: 101 mmol/L (ref 98–111)
Creatinine, Ser: 2.93 mg/dL — ABNORMAL HIGH (ref 0.61–1.24)
GFR, Estimated: 22 mL/min — ABNORMAL LOW (ref >60)
Glucose, Bld: 99 mg/dL (ref 70–99)
Potassium: 3.9 mmol/L (ref 3.5–5.1)
Sodium: 131 mmol/L — ABNORMAL LOW (ref 135–145)

## 2021-02-27 LAB — GLUCOSE, CAPILLARY
Glucose-Capillary: 99 mg/dL (ref 70–99)
Glucose-Capillary: 135 mg/dL — ABNORMAL HIGH (ref 70–99)
Glucose-Capillary: 99 mg/dL (ref 70–99)
Glucose-Capillary: 134 mg/dL — ABNORMAL HIGH (ref 70–99)

## 2021-02-27 MED ORDER — TORSEMIDE 20 MG PO TABS
40.0000 mg | ORAL_TABLET | Freq: Two times a day (BID) | ORAL | Status: DC
  Administered 2021-02-27 – 2021-02-28 (×2): 40 mg via ORAL
  Filled 2021-02-27 (×2): qty 2

## 2021-02-27 MED ORDER — ONDANSETRON HCL 4 MG/2ML IJ SOLN
4.0000 mg | Freq: Four times a day (QID) | INTRAMUSCULAR | Status: DC | PRN
  Filled 2021-02-27: qty 2

## 2021-02-27 NOTE — Progress Notes (Signed)
Occupational Therapy Treatment Patient Details Name: James [name] MRN: [id number] DOB: May 08, 1945 Today's Date: 02/27/2021   History of present illness Pt is a 76 y.o. male admitted 02/23/21 with BLE weeping and swelling, as well as fall sustaining abrasion to buttocks and LUE. Workup for BLE cellulitis, CHF, AKI on CKD 3. PMH includes HTN, HLD, CHF, CAD, CVA, AAA, CKD.   OT comments  Pt progressing towards goals this session, requiring min-max A for ADLs, fatigues quickly during standing grooming tasks, uses forearms to support self on coutnertop. Pt min A for transfers, benefits from elevated surface and cues for hand placement. Pt VSS on RA during session, reports minimal BLE pain. Pt presenting with impairments listed below, will continue to follow acutely. Recommend d/c home with HHOT.   Recommendations for follow up therapy are one component of a multi-disciplinary discharge planning process, led by the attending physician.  Recommendations may be updated based on patient status, additional functional criteria and insurance authorization.    Follow Up Recommendations  Home health OT    Assistance Recommended at Discharge Intermittent Supervision/Assistance  Patient can return home with the following  A little help with walking and/or transfers;A little help with bathing/dressing/bathroom;Assistance with cooking/housework   Equipment Recommendations  Other (comment) (sock aid)    Recommendations for Other Services PT consult    Precautions / Restrictions Precautions Precautions: Fall Restrictions Weight Bearing Restrictions: No       Mobility Bed Mobility               General bed mobility comments: pt sitting EOB with NT upon arrival    Transfers Overall transfer level: Needs assistance Equipment used: Rolling walker (2 wheels) Transfers: Sit to/from Stand Sit to Stand: Min assist                 Balance Overall balance assessment: Needs  assistance Sitting-balance support: Feet supported Sitting balance-Leahy Scale: Fair     Standing balance support: Reliant on assistive device for balance;During functional activity Standing balance-Leahy Scale: Poor                             ADL either performed or assessed with clinical judgement   ADL Overall ADL's : Needs assistance/impaired     Grooming: Wash/dry hands;Oral care;Standing;Min guard Grooming Details (indicate cue type and reason): props self on forearms while brushing teeth in standing         Upper Body Dressing : Minimal assistance;Sitting Upper Body Dressing Details (indicate cue type and reason): min A to manage tele/IV     Toilet Transfer: Rolling walker (2 wheels);Ambulation;Regular Toilet;Minimal assistance Toilet Transfer Details (indicate cue type and reason): simulated to chair in room Toileting- Clothing Manipulation and Hygiene: Maximal assistance;Sit to/from stand Toileting - Clothing Manipulation Details (indicate cue type and reason): clothing/manipulate catheter for pericare     Functional mobility during ADLs: Minimal assistance      Extremity/Trunk Assessment Upper Extremity Assessment Upper Extremity Assessment: Overall WFL for tasks assessed   Lower Extremity Assessment Lower Extremity Assessment: Defer to PT evaluation        Vision   Vision Assessment?: No apparent visual deficits   Perception Perception Perception: Not tested   Praxis Praxis Praxis: Not tested    Cognition Arousal/Alertness: Awake/alert Behavior During Therapy: WFL for tasks assessed/performed Overall Cognitive Status: Within Functional Limits for tasks assessed  Exercises     Shoulder Instructions       General Comments VSS on RA    Pertinent Vitals/ Pain       Pain Assessment: Faces Pain Score: 2  Faces Pain Scale: Hurts a little bit Pain Location: BLEs Pain  Descriptors / Indicators: Sore Pain Intervention(s): Limited activity within patient's tolerance;Monitored during session  Home Living                                          Prior Functioning/Environment              Frequency  Min 2X/week        Progress Toward Goals  OT Goals(current goals can now be found in the care plan section)     Acute Rehab OT Goals Patient Stated Goal: to go home OT Goal Formulation: With patient Time For Goal Achievement: 03/11/21 Potential to Achieve Goals: Good ADL Goals Pt Will Perform Upper Body Dressing: with min guard assist;sitting Pt Will Perform Lower Body Dressing: with min guard assist;with adaptive equipment;sitting/lateral leans Pt Will Transfer to Toilet: with supervision;regular height toilet;ambulating Pt Will Perform Tub/Shower Transfer: rolling walker;shower seat;ambulating;Shower transfer;with min guard assist  Plan Discharge plan remains appropriate;Frequency remains appropriate    Co-evaluation                 AM-PAC OT "6 Clicks" Daily Activity     Outcome Measure   Help from another person eating meals?: None Help from another person taking care of personal grooming?: A Little Help from another person toileting, which includes using toliet, bedpan, or urinal?: A Lot Help from another person bathing (including washing, rinsing, drying)?: A Lot Help from another person to put on and taking off regular upper body clothing?: A Little Help from another person to put on and taking off regular lower body clothing?: A Lot 6 Click Score: 16    End of Session Equipment Utilized During Treatment: Gait belt;Rolling walker (2 wheels)  OT Visit Diagnosis: Unsteadiness on feet (R26.81);Muscle weakness (generalized) (M62.81)   Activity Tolerance Patient tolerated treatment well   Patient Left in chair;with call bell/phone within reach;with chair alarm set   Nurse Communication Mobility status         Time: 0092-3300 OT Time Calculation (min): 18 min  Charges: OT General Charges $OT Visit: 1 Visit OT Treatments $Self Care/Home Management : 8-22 mins  James [name], OTD, OTR/L Acute Rehab [phone]) [phone]   James [name] 02/27/2021, 11:33 AM

## 2021-02-27 NOTE — Progress Notes (Signed)
PROGRESS NOTE    James [name]  [id number] DOB: 09-11-1945 DOA: 02/23/2021 PCP: Lyman Bishop, DO  Chief Complaint  Patient presents with   Abnormal Labs    Brief Narrative:    James [name] is a 76 y.o. male with medical history significant of hypertension, hyperlipidemia, systolic congestive heart failure, CAD, CVA, AAA, and CKD presents with complaints of his legs weeping over the last 2 weeks.  He noted that his orders were covered in the drainage from his wounds and noted that there was a foul odor to it.   He was admitted for acute on chronic combined CHF, acute on stage 3b CKD, and cellulitis of the lower extremities.   Assessment & Plan:   Principal Problem:   Cellulitis Active Problems:   Acute CHF (congestive heart failure) (HCC)   History of CVA (cerebrovascular accident)   Hyperkalemia   Acute kidney injury superimposed on chronic kidney disease (HCC)   Normocytic anemia   Cellulitis of the lower extremities Improving with IV antibiotics Lower extremity duplex negative for DVT.  Wound care consulted and recommendations given dressing changes. Patient afebrile, WBC within normal limits.      Acute on chronic combined CHF Echocardiogram showed left ventricular ejection fraction of 40 to 45%.The left ventricle  demonstrates global hypokinesis. Left ventricular diastolic parameters are consistent with Grade II diastolic  dysfunction (pseudonormalization). Elevated left atrial pressure. Continue with IV Lasix 40 mg twice daily.    Acute on stage IIIb CKD and hyperkalemia  Potassium within normal limits Creatinine improving.    Hyperlipidemia Continue with Lipitor.   History of CVA Continue with Plavix.   Anemia of chronic disease Continue to monitor    Type 2 diabetes mellitus uncontrolled with hyperglycemia.  Insulin-dependent Hemoglobin A1c around 7.6 CBG (last 3)  Recent Labs    02/26/21 2149 02/27/21 0730 02/27/21 1133  GLUCAP  154* 99 135*  Continue with Semglee and sliding scale insulin    DVT prophylaxis: Heparin.  Code Status: Full code.  Family Communication: none at bedside.  Disposition:   Status is: Inpatient  Remains inpatient appropriate because: IV antibiotics.        Consultants:  nephrology  Procedures: none.   Antimicrobials:  Antibiotics Given (last 72 hours)     Date/Time Action Medication Dose Rate   02/25/21 1129 New Bag/Given   ceFEPIme (MAXIPIME) 2 g in sodium chloride 0.9 % 100 mL IVPB 2 g 200 mL/hr   02/25/21 1621 New Bag/Given   vancomycin (VANCOREADY) IVPB 1250 mg/250 mL 1,250 mg 166.7 mL/hr   02/26/21 0905 New Bag/Given   ceFEPIme (MAXIPIME) 2 g in sodium chloride 0.9 % 100 mL IVPB 2 g 200 mL/hr   02/27/21 0917 New Bag/Given   ceFEPIme (MAXIPIME) 2 g in sodium chloride 0.9 % 100 mL IVPB 2 g 200 mL/hr         Subjective: No chest pain or sob. . Wants to know when he can go home.   Objective: Vitals:   02/26/21 1048 02/26/21 2004 02/27/21 0452 02/27/21 0707  BP: (!) 121/52 129/66 119/67 122/66  Pulse: 64 64 61 62  Resp: 18 18 18 18   Temp: 98 F (36.7 C) 98.3 F (36.8 C) 98 F (36.7 C) 97.7 F (36.5 C)  TempSrc:  Oral Oral   SpO2: 93% 95% 98% 97%  Weight:   107.3 kg   Height:        Intake/Output Summary (Last 24 hours) at 02/27/2021 1136 Last data  filed at 02/27/2021 0457 Gross per 24 hour  Intake --  Output 2850 ml  Net -2850 ml   Filed Weights   02/24/21 0500 02/25/21 0518 02/27/21 0452  Weight: 110 kg 110.9 kg 107.3 kg    Examination:  General exam: Appears calm and comfortable  Respiratory system: Clear to auscultation. Respiratory effort normal. Cardiovascular system: S1 & S2 heard, RRR. No JVD,  No pedal edema. Gastrointestinal system: Abdomen is nondistended, soft and nontender. Normal bowel sounds heard. Central nervous system: Alert and oriented. No focal neurological deficits. Extremities:  lower extremity cellulitis.  Skin: No  rashes, lesions or ulcers Psychiatry: Mood & affect appropriate.     Data Reviewed: I have personally reviewed following labs and imaging studies  CBC: Recent Labs  Lab 02/23/21 0303 02/23/21 0744 02/26/21 1041  WBC 12.7*  --  8.9  NEUTROABS  --  7.9*  --   HGB 12.9*  --  12.0*  HCT 40.0  --  36.1*  MCV 91.1  --  87.6  PLT 327  --  [id number]    Basic Metabolic Panel: Recent Labs  Lab 02/23/21 0303 02/23/21 0942 02/23/21 2012 02/24/21 0315 02/25/21 0325 02/26/21 1041 02/27/21 0146  NA 137   < > 137 136 135 132* 131*  K 6.5*   < > 5.8* 5.6* 5.2* 4.7 3.9  CL 109   < > 108 108 105 101 101  CO2 19*   < > 20* 19* 21* 18* 19*  GLUCOSE 139*   < > 192* 112* 92 112* 99  BUN 79*   < > 74* 73* 79* 88* 83*  CREATININE 3.14*   < > 3.01* 3.08* 2.98* 3.07* 2.93*  CALCIUM 8.7*   < > 8.4* 8.5* 8.6* 8.2* 8.2*  MG 2.6*  --   --   --   --   --   --    < > = values in this interval not displayed.    GFR: Estimated Creatinine Clearance: 26.3 mL/min (A) (by C-G formula based on SCr of 2.93 mg/dL (H)).  Liver Function Tests: Recent Labs  Lab 02/23/21 0303  AST 17  ALT 16  ALKPHOS 118  BILITOT 0.3  PROT 7.6  ALBUMIN 3.6    CBG: Recent Labs  Lab 02/26/21 1149 02/26/21 1540 02/26/21 2149 02/27/21 0730 02/27/21 1133  GLUCAP 164* 120* 154* 99 135*     Recent Results (from the past 240 hour(s))  Resp Panel by RT-PCR (Flu A&B, Covid) Nasopharyngeal Swab     Status: None   Collection Time: 02/23/21  6:41 AM   Specimen: Nasopharyngeal Swab; Nasopharyngeal(NP) swabs in vial transport medium  Result Value Ref Range Status   SARS Coronavirus 2 by RT PCR NEGATIVE NEGATIVE Final    Comment: (NOTE) SARS-CoV-2 target nucleic acids are NOT DETECTED.  The SARS-CoV-2 RNA is generally detectable in upper respiratory specimens during the acute phase of infection. The lowest concentration of SARS-CoV-2 viral copies this assay can detect is 138 copies/mL. A negative result does not  preclude SARS-Cov-2 infection and should not be used as the sole basis for treatment or other patient management decisions. A negative result may occur with  improper specimen collection/handling, submission of specimen other than nasopharyngeal swab, presence of viral mutation(s) within the areas targeted by this assay, and inadequate number of viral copies(<138 copies/mL). A negative result must be combined with clinical observations, patient history, and epidemiological information. The expected result is Negative.  Fact Sheet for Patients:  EntrepreneurPulse.com.au  Fact Sheet for Healthcare Providers:  SeriousBroker.it  This test is no t yet approved or cleared by the [place] FDA and  has been authorized for detection and/or diagnosis of SARS-CoV-2 by FDA under an Emergency Use Authorization (EUA). This EUA will remain  in effect (meaning this test can be used) for the duration of the COVID-19 declaration under Section 564(b)(1) of the Act, 21 U.S.C.section 360bbb-3(b)(1), unless the authorization is terminated  or revoked sooner.       Influenza A by PCR NEGATIVE NEGATIVE Final   Influenza B by PCR NEGATIVE NEGATIVE Final    Comment: (NOTE) The Xpert Xpress SARS-CoV-2/FLU/RSV plus assay is intended as an aid in the diagnosis of influenza from Nasopharyngeal swab specimens and should not be used as a sole basis for treatment. Nasal washings and aspirates are unacceptable for Xpert Xpress SARS-CoV-2/FLU/RSV testing.  Fact Sheet for Patients: BloggerCourse.com  Fact Sheet for Healthcare Providers: SeriousBroker.it  This test is not yet approved or cleared by the [place] FDA and has been authorized for detection and/or diagnosis of SARS-CoV-2 by FDA under an Emergency Use Authorization (EUA). This EUA will remain in effect (meaning this test can be used) for the  duration of the COVID-19 declaration under Section 564(b)(1) of the Act, 21 U.S.C. section 360bbb-3(b)(1), unless the authorization is terminated or revoked.  Performed at [facility], [address] [place], [place] [address]   Blood culture (routine x 2)     Status: None (Preliminary result)   Collection Time: 02/23/21  7:33 AM   Specimen: BLOOD RIGHT HAND  Result Value Ref Range Status   Specimen Description BLOOD RIGHT HAND  Final   Special Requests   Final    BOTTLES DRAWN AEROBIC AND ANAEROBIC Blood Culture results may not be optimal due to an inadequate volume of blood received in culture bottles   Culture   Final    NO GROWTH 3 DAYS Performed at [facility], [address] [place], [place] [address]    Report Status PENDING  Incomplete  Blood culture (routine x 2)     Status: None (Preliminary result)   Collection Time: 02/23/21  7:44 AM   Specimen: BLOOD  Result Value Ref Range Status   Specimen Description BLOOD SITE NOT SPECIFIED  Final   Special Requests   Final    BOTTLES DRAWN AEROBIC AND ANAEROBIC Blood Culture adequate volume   Culture   Final    NO GROWTH 3 DAYS Performed at [facility], [address] [place], [place] [address]    Report Status PENDING  Incomplete         Radiology Studies: No results found.      Scheduled Meds:  atorvastatin  80 mg Oral Daily   clopidogrel  75 mg Oral Daily   furosemide  40 mg Intravenous BID   heparin  5,000 Units Subcutaneous Q8H   insulin aspart  0-15 Units Subcutaneous TID WC   insulin glargine-yfgn  10 Units Subcutaneous QHS   pramipexole  1.5 mg Oral QHS   sodium chloride flush  3 mL Intravenous Q12H   vancomycin variable dose per unstable renal function (pharmacist dosing)   Does not apply See admin instructions   Continuous Infusions:  ceFEPime (MAXIPIME) IV 2 g (02/27/21 0917)   vancomycin 1,250 mg (02/25/21 1621)     LOS: 4 days        Kathlen Mody, MD [facility]   To contact  the attending provider between 7A-7P or the covering provider during after hours 7P-7A, please log into the web site [email or web address] and access using universal [place] password for that web site. If you do not have the password, please call the hospital operator.  02/27/2021, 11:36 AM

## 2021-02-27 NOTE — Progress Notes (Signed)
Mobility Specialist Progress Note    02/27/21 1332  Mobility  Activity Transferred:  Chair to bed  Level of Assistance Minimal assist, patient does 75% or more  Assistive Device Front wheel walker  Distance Ambulated (ft) 2 ft  Mobility Out of bed to chair with meals  Mobility Response Tolerated fair  Mobility performed by Mobility specialist  $Mobility charge 1 Mobility   [facility] Mobility Specialist  M.S. 2C and 6E: [phone] M.S. 4E: [phone]

## 2021-02-27 NOTE — Progress Notes (Signed)
Physical Therapy Treatment Patient Details Name: James [name] MRN: [id number] DOB: 1945/12/18 Today's Date: 02/27/2021   History of Present Illness Pt is a 76 y.o. male admitted 02/23/21 with BLE weeping and swelling, as well as fall sustaining abrasion to buttocks and LUE. Workup for BLE cellulitis, CHF, AKI on CKD 3. PMH includes HTN, HLD, CHF, CAD, CVA, AAA, CKD.    PT Comments    Pt agreeable to working with therapy, however is preoccupied, reporting that he is having difficulty with getting a replacement [place] reader. Assisted pt in finding DM physician number. Pt is min guard for transfers and ambulation, however fatigues quickly and requires standing rest breaks. D/c plans remain appropriate at this time. PT will continue to follow acutely.     Recommendations for follow up therapy are one component of a multi-disciplinary discharge planning process, led by the attending physician.  Recommendations may be updated based on patient status, additional functional criteria and insurance authorization.  Follow Up Recommendations  Home health PT     Assistance Recommended at Discharge Intermittent Supervision/Assistance  Patient can return home with the following Help with stairs or ramp for entrance;Assistance with cooking/housework;Assist for transportation   Equipment Recommendations  None recommended by PT       Precautions / Restrictions Precautions Precautions: Fall Restrictions Weight Bearing Restrictions: No     Mobility  Bed Mobility               General bed mobility comments: sitting in recliner on entry    Transfers Overall transfer level: Needs assistance Equipment used: Rolling walker (2 wheels) Transfers: Sit to/from Stand Sit to Stand: Min guard           General transfer comment: Min guard for safety, pt requires incresed effort and vc for scooting hips to edge of chair before powering up to standing    Ambulation/Gait Ambulation/Gait  assistance: Min guard Gait Distance (Feet): 80 Feet Assistive device: Rolling walker (2 wheels) Gait Pattern/deviations: Step-through pattern;Decreased stride length;Trunk flexed Gait velocity: Decreased Gait velocity interpretation: <1.31 ft/sec, indicative of household ambulator   General Gait Details: min guard for safety, pt noted to have L sided limp initially, pt unaware, when pointed out and works itself out with distance. Pt requires 2x standing rest breaks      Balance Overall balance assessment: Needs assistance Sitting-balance support: Feet supported Sitting balance-Leahy Scale: Fair     Standing balance support: Reliant on assistive device for balance;During functional activity Standing balance-Leahy Scale: Poor                              Cognition Arousal/Alertness: Awake/alert Behavior During Therapy: WFL for tasks assessed/performed Overall Cognitive Status: Within Functional Limits for tasks assessed                                 General Comments: WFL for simple tasks, although question some decreased insight/awareness           General Comments General comments (skin integrity, edema, etc.): VSS on RA      Pertinent Vitals/Pain Pain Assessment: Faces Pain Score: 2  Faces Pain Scale: Hurts a little bit Pain Location: BLEs Pain Descriptors / Indicators: Sore Pain Intervention(s): Limited activity within patient's tolerance;Monitored during session;Repositioned     PT Goals (current goals can now be found in the care plan section) Acute Rehab PT  Goals Patient Stated Goal: Adamant about return home; had horrible prior experience at SNF PT Goal Formulation: With patient Time For Goal Achievement: 03/10/21 Potential to Achieve Goals: Good Progress towards PT goals: Progressing toward goals    Frequency    Min 3X/week      PT Plan Current plan remains appropriate       AM-PAC PT "6 Clicks" Mobility   Outcome  Measure  Help needed turning from your back to your side while in a flat bed without using bedrails?: None Help needed moving from lying on your back to sitting on the side of a flat bed without using bedrails?: A Little Help needed moving to and from a bed to a chair (including a wheelchair)?: A Little Help needed standing up from a chair using your arms (e.g., wheelchair or bedside chair)?: A Little Help needed to walk in hospital room?: A Little Help needed climbing 3-5 steps with a railing? : A Little 6 Click Score: 19    End of Session Equipment Utilized During Treatment: Gait belt Activity Tolerance: Patient tolerated treatment well Patient left: in chair;with call bell/phone within reach;with chair alarm set Nurse Communication: Mobility status PT Visit Diagnosis: Other abnormalities of gait and mobility (R26.89);Muscle weakness (generalized) (M62.81)     Time: [id number] PT Time Calculation (min) (ACUTE ONLY): 25 min  Charges:  $Gait Training: 8-22 mins $Therapeutic Activity: 8-22 mins                     Alexandros Ewan B. Migdalia Dk PT, DPT Acute Rehabilitation Services Pager [phone] Office [phone]    [place] 02/27/2021, 12:33 PM

## 2021-02-27 NOTE — TOC Initial Note (Signed)
Transition of Care [facility]) - Initial/Assessment Note    Patient Details  Name: James [name] MRN: [id number] Date of Birth: Jul 03, 1945  Transition of Care [facility]) CM/SW Contact:    Bethena Roys, RN Phone Number: 02/27/2021, 4:25 PM  Clinical Narrative:  Risk for readmission assessment completed. Patient presented from home alone. Patient has support of his daughter. Patient states he lives in a single family home with four steps to get into his door. Case Manager discussed home health services: Physical Therapy, Occupational Therapy, Aide and Registered Nurse for wound care. Medicare.gov list provided to the patient and friend in the room. Patient chose [facility] and referral was sent. The office can service the patient since the daughter is agreeable to be educated in regards to wound care. Patient will need HH PT,OT, Aide and RN orders with wound care in the comment section. Case Manager will continue to follow for disposition needs.                  Expected Discharge Plan: [place] Barriers to Discharge: Continued Medical Work up   Patient Goals and CMS Choice Patient states their goals for this hospitalization and ongoing recovery are:: to return home as soon as possible CMS Medicare.gov Compare Post Acute Care list provided to:: Patient Choice offered to / list presented to : Adult Children, Patient  Expected Discharge Plan and Services Expected Discharge Plan: [place]   Discharge Planning Services: CM Consult Post Acute Care Choice: [place] arrangements for the past 2 months: [place] (has four steps to get into the home.)                   DME Agency: NA       HH Arranged: RN, Disease Management, PT, OT HH Agency: [place] Date [facility] Agency Contacted: 02/27/21 Time [place]: 4 Representative spoke with at [place]: [place] Arrangements/Services Living arrangements  for the past 2 months: [place] (has four steps to get into the home.) Lives with:: Self (has support of his daughter.) Patient language and need for interpreter reviewed:: Yes Do you feel safe going back to the place where you live?: Yes      Need for Family Participation in Patient Care: Yes (Comment) Care giver support system in place?: Yes (comment)   Criminal Activity/Legal Involvement Pertinent to Current Situation/Hospitalization: No - Comment as needed  Activities of Daily Living Home Assistive Devices/Equipment: Cane (specify quad or straight), Grab bars in shower ADL Screening (condition at time of admission) Patient's cognitive ability adequate to safely complete daily activities?: Yes Is the patient deaf or have difficulty hearing?: No Does the patient have difficulty seeing, even when wearing glasses/contacts?: No Does the patient have difficulty concentrating, remembering, or making decisions?: No Patient able to express need for assistance with ADLs?: Yes Does the patient have difficulty dressing or bathing?: No Independently performs ADLs?: Yes (appropriate for developmental age) Does the patient have difficulty walking or climbing stairs?: Yes Weakness of Legs: Both Weakness of Arms/Hands: None  Permission Sought/Granted Permission sought to share information with : Family Supports, Customer service manager, Case Optician, dispensing granted to share information with : Yes, Verbal Permission Granted     Permission granted to share info w AGENCY: Alvis Lemmings        Emotional Assessment Appearance:: Appears stated age Attitude/Demeanor/Rapport: Engaged Affect (typically observed): Appropriate Orientation: : Oriented to Situation,  Oriented to  Time, Oriented to Place, Oriented to Self Alcohol / Substance Use: Not Applicable Psych Involvement: No (comment)  Admission diagnosis:  Edema [R60.9] Hyperkalemia [E87.5] Cellulitis [L03.90] AKI (acute kidney  injury) ([place]) [N17.9] Cellulitis of lower extremity, unspecified laterality [L03.119] Acute on chronic heart failure, unspecified heart failure type [facility]) [I50.9] Patient Active Problem List   Diagnosis Date Noted   Cellulitis 02/23/2021   History of CVA (cerebrovascular accident) 02/23/2021   Hyperkalemia 02/23/2021   Acute kidney injury superimposed on chronic kidney disease ([place]) 02/23/2021   Normocytic anemia 02/23/2021   Acute CVA (cerebrovascular accident) ([place]) 11/21/2019   Acute CHF (congestive heart failure) ([place]) 09/09/2019   CHF (congestive heart failure) ([place]) 09/09/2019   Cryptogenic stroke ([place]) 04/27/2019   Left sided numbness 02/06/2019   Acute lacunar infarction ([place]) 02/06/2019   Obesity, Class II, BMI 35-39.9 02/06/2019   Hyperlipidemia associated with type 2 diabetes mellitus ([place])    Ischemic cardiomyopathy 10/12/2017   Mild pulmonary hypertension ([place]) 10/12/2017   S/P CABG (coronary artery bypass graft) 10/01/2017   Status post coronary artery stent placement    Left ventricular systolic dysfunction [id number]   Diverticulitis of sigmoid colon 0000000   Chronic systolic CHF (congestive heart failure) ([place]) 07/24/2017   CKD (chronic kidney disease), stage III ([place]) 07/24/2017   Pleural thickening 07/24/2017   Aortic atherosclerosis ([place]) 07/07/2017   Abnormal nuclear cardiac imaging test 11/14/2013   Abnormal EKG 03/09/2013   Dilated aortic root ([place])    Edema 02/09/2013   Hypertension associated with diabetes ([place]) 10/24/2011   Coronary artery disease 10/24/2011   Hyperlipidemia 10/24/2011   DM (diabetes mellitus), type 2, uncontrolled with complications ([place]) [id number]   GERD (gastroesophageal reflux disease) 10/24/2011   PCP:  Lyman Bishop, DO Pharmacy:   [facility] - [place], [place] - [address] [place] [address] Phone: [phone] Fax: [phone]   Readmission Risk  Interventions Readmission Risk Prevention Plan 02/27/2021  Transportation Screening Complete  HRI or Home Care Consult Complete  Social Work Consult for [place] Planning/Counseling Complete  Palliative Care Screening Not Applicable  Medication Review Press photographer) Complete  Some recent data might be hidden

## 2021-02-27 NOTE — Progress Notes (Signed)
[facility] Progress Note   [name] is a 76 yo male with a history of HTN, HLD, systolic congestive heart failure, CAD, CVA, AAA, and CKD. He reported that he has had weeping of his legs bilaterally for the past 2 weeks. Admitted to the hospital for cellulitis treatment with IV cefepime antibiotics. Assessment/ Plan:   Assessment/Plan: Acute on CKD3a? Creatinine was in the 1.3-1.6 range in 11/2019 and then next labs are this admission so unclear if his renal disease progressed. We will need to find out if he has any chemistry panels at his PCP [facility] in [place]- awaiting - Cr overall improvement for the last couple of days- 2.93 from 3.07 _ K improving. K+ 3.9 from 4.7 - Good response to diuretics--> will try to switch to PO torsemide today--> takes at home.  Will convert to 40 mg BID -  no obstruction on US - Renal diet with 1.2L fluid restriction    -Monitor Daily I/Os, Daily weight  -Maintain MAP>65 for optimal renal perfusion.  -Avoid nephrotoxic medications including NSAIDs   Hyperkalemia secondary to the renal dysfunction + potassium replacement + diet (bananas) at home. - Continued response to diuretics will help - Lokelma 10gm PO BID, improved - K+ 3.9 on 02/27/21 3.   Hyponatremia- gradual decrease in NA over the past 4 days. Today, his Na is 131. Anticipate Na will correct with continued diuresis. Will continue to monitor electrolytes.   4.   HFrEF - Echo shows EF 40-45%; on diuretics at this time. 5.   Cellulitis- on cefepime 2g q 24hr per primary 6.   DM - per primary team and on SSI as well as glargine 10 U at bedtime 7.   HTN - Controlled and BP readings reviewed 9.   Anemia - will follow for now, likely has a component of anemia from chronic disease/ inflammatory state. Transfuse as needed, would not given IV iron with possible cellulitis and no ESA indicated.  Subjective:    Cr fluctuating- Cr 2.93 today.  No complaints.  Overall feeling OK.        Objective:   BP (!) 140/59 (BP Location: Left Wrist)    Pulse 72    Temp 97.6 F (36.4 C) (Oral)    Resp 16    Ht 5\' 9"  (1.753 m)    Wt 107.3 kg    SpO2 97%    BMI 34.93 kg/m   Intake/Output Summary (Last 24 hours) at 02/27/2021 1423 Last data filed at 02/27/2021 0457 Gross per 24 hour  Intake --  Output 2250 ml  Net -2250 ml   Weight change:   Physical Exam: Gen: alert and in bed CVS: RRR Resp: coarse bilaterally Abd: soft and non tender Ext: 1+ LE edema, wrapped  Imaging: No results found.  Labs: BMET Recent Labs  Lab 02/23/21 0303 02/23/21 0942 02/23/21 2012 02/24/21 0315 02/25/21 0325 02/26/21 1041 02/27/21 0146  NA 137 135 137 136 135 132* 131*  K 6.5* 6.4* 5.8* 5.6* 5.2* 4.7 3.9  CL 109 109 108 108 105 101 101  CO2 19* 16* 20* 19* 21* 18* 19*  GLUCOSE 139* 149* 192* 112* 92 112* 99  BUN 79* 77* 74* 73* 79* 88* 83*  CREATININE 3.14* 2.92* 3.01* 3.08* 2.98* 3.07* 2.93*  CALCIUM 8.7* 8.7* 8.4* 8.5* 8.6* 8.2* 8.2*   CBC Recent Labs  Lab 02/23/21 0303 02/23/21 0744 02/26/21 1041  WBC 12.7*  --  8.9  NEUTROABS  --  7.9*  --  HGB 12.9*  --  12.0*  HCT 40.0  --  36.1*  MCV 91.1  --  87.6  PLT 327  --  281    Medications:     atorvastatin  80 mg Oral Daily   clopidogrel  75 mg Oral Daily   furosemide  40 mg Intravenous BID   heparin  5,000 Units Subcutaneous Q8H   insulin aspart  0-15 Units Subcutaneous TID WC   insulin glargine-yfgn  10 Units Subcutaneous QHS   pramipexole  1.5 mg Oral QHS   sodium chloride flush  3 mL Intravenous Q12H      Bufford Buttner, MD 02/27/2021, 2:23 PM

## 2021-02-28 DIAGNOSIS — I509 Heart failure, unspecified: Secondary | ICD-10-CM

## 2021-02-28 LAB — BASIC METABOLIC PANEL
Anion gap: 11 (ref 5–15)
BUN: 87 mg/dL — ABNORMAL HIGH (ref 8–23)
CO2: 20 mmol/L — ABNORMAL LOW (ref 22–32)
Calcium: 8.6 mg/dL — ABNORMAL LOW (ref 8.9–10.3)
Chloride: 103 mmol/L (ref 98–111)
Creatinine, Ser: 2.77 mg/dL — ABNORMAL HIGH (ref 0.61–1.24)
GFR, Estimated: 23 mL/min — ABNORMAL LOW (ref >60)
Glucose, Bld: 128 mg/dL — ABNORMAL HIGH (ref 70–99)
Potassium: 4.2 mmol/L (ref 3.5–5.1)
Sodium: 134 mmol/L — ABNORMAL LOW (ref 135–145)

## 2021-02-28 LAB — CULTURE, BLOOD (ROUTINE X 2)
Culture: NO GROWTH
Culture: NO GROWTH
Special Requests: ADEQUATE

## 2021-02-28 LAB — GLUCOSE, CAPILLARY
Glucose-Capillary: 136 mg/dL — ABNORMAL HIGH (ref 70–99)
Glucose-Capillary: 112 mg/dL — ABNORMAL HIGH (ref 70–99)
Glucose-Capillary: 124 mg/dL — ABNORMAL HIGH (ref 70–99)

## 2021-02-28 MED ORDER — AMOXICILLIN-POT CLAVULANATE 500-125 MG PO TABS: 1.0000 | ORAL_TABLET | Freq: Two times a day (BID) | ORAL | 0 refills | Status: AC

## 2021-02-28 MED ORDER — TORSEMIDE 40 MG PO TABS: 40.0000 mg | ORAL_TABLET | Freq: Two times a day (BID) | ORAL | 2 refills | Status: AC

## 2021-02-28 NOTE — Care Management Important Message (Signed)
Important Message  Patient Details  Name: James [name] MRN: [id number] Date of Birth: Oct 31, 1945   Medicare Important Message Given:  Yes     [name] 02/28/2021, 8:14 AM

## 2021-02-28 NOTE — Progress Notes (Signed)
Occupational Therapy Treatment Patient Details Name: James [name] MRN: [id number] DOB: 1945/07/28 Today's Date: 02/28/2021   History of present illness Pt is a 76 y.o. male admitted 02/23/21 with BLE weeping and swelling, as well as fall sustaining abrasion to buttocks and LUE. Workup for BLE cellulitis, CHF, AKI on CKD 3. PMH includes HTN, HLD, CHF, CAD, CVA, AAA, CKD.   OT comments  Pt making progress with functional goals. Pt perseverating on going home and tyrnig to find a ride to get home. Pt agreeable to OOB activity with session focusing on functional mobility using RW, toilet transfers, toileting, UB dressing and grooming at sink. OT will continue to follow acutely to maximize level of function and safety   Recommendations for follow up therapy are one component of a multi-disciplinary discharge planning process, led by the attending physician.  Recommendations may be updated based on patient status, additional functional criteria and insurance authorization.    Follow Up Recommendations  Home health OT    Assistance Recommended at Discharge Intermittent Supervision/Assistance  Patient can return home with the following  A little help with walking and/or transfers;A little help with bathing/dressing/bathroom;Assistance with cooking/housework;Assist for transportation   Equipment Recommendations  Other (comment) (LH bath sponge, sick aid)    Recommendations for Other Services      Precautions / Restrictions Precautions Precautions: Fall Restrictions Weight Bearing Restrictions: No       Mobility Bed Mobility Overal bed mobility: Needs Assistance Bed Mobility: Supine to Sit     Supine to sit: HOB elevated;Min guard          Transfers Overall transfer level: Needs assistance Equipment used: Rolling walker (2 wheels) Transfers: Sit to/from Stand Sit to Stand: Min guard           General transfer comment: Min guard for safety, pt requires incresed time and  effort, cues for scooting hips to EOB to power up to standing     Balance Overall balance assessment: Needs assistance Sitting-balance support: Feet supported Sitting balance-Leahy Scale: Fair     Standing balance support: During functional activity;Bilateral upper extremity supported;Single extremity supported;Reliant on assistive device for balance Standing balance-Leahy Scale: Poor                             ADL either performed or assessed with clinical judgement   ADL Overall ADL's : Needs assistance/impaired     Grooming: Wash/dry hands;Wash/dry face;Min guard;Standing           Upper Body Dressing : Min guard;Sitting   Lower Body Dressing: Moderate assistance;Sitting/lateral leans Lower Body Dressing Details (indicate cue type and reason): dons socks with sock aid sitting EOB Toilet Transfer: Rolling walker (2 wheels);Ambulation;Regular Toilet;Minimal assistance;Min guard;Cueing for safety   Toileting- Clothing Manipulation and Hygiene: Moderate assistance;Sit to/from stand Toileting - Clothing Manipulation Details (indicate cue type and reason): clothing mgt, peri care     Functional mobility during ADLs: Minimal assistance;Min guard;Rolling walker (2 wheels);Cueing for safety      Extremity/Trunk Assessment Upper Extremity Assessment Upper Extremity Assessment: Overall WFL for tasks assessed   Lower Extremity Assessment Lower Extremity Assessment: Defer to PT evaluation   Cervical / Trunk Assessment Cervical / Trunk Assessment: Kyphotic    Vision Baseline Vision/History: 1 Wears glasses Ability to See in Adequate Light: 0 Adequate Patient Visual Report: No change from baseline     Perception     Praxis      Cognition Arousal/Alertness:  Awake/alert Behavior During Therapy: WFL for tasks assessed/performed Overall Cognitive Status: Within Functional Limits for tasks assessed                                 General  Comments: decreased insight/awareness of deficits          Exercises     Shoulder Instructions       General Comments      Pertinent Vitals/ Pain       Pain Assessment: Faces Faces Pain Scale: Hurts a little bit Pain Location: BLEs Pain Descriptors / Indicators: Sore;Discomfort Pain Intervention(s): Monitored during session;Repositioned  Home Living                                          Prior Functioning/Environment              Frequency  Min 2X/week        Progress Toward Goals  OT Goals(current goals can now be found in the care plan section)  Progress towards OT goals: Progressing toward goals     Plan Discharge plan remains appropriate;Frequency remains appropriate    Co-evaluation                 AM-PAC OT "6 Clicks" Daily Activity     Outcome Measure   Help from another person eating meals?: None Help from another person taking care of personal grooming?: A Little Help from another person toileting, which includes using toliet, bedpan, or urinal?: A Lot Help from another person bathing (including washing, rinsing, drying)?: A Lot Help from another person to put on and taking off regular upper body clothing?: A Little Help from another person to put on and taking off regular lower body clothing?: A Lot 6 Click Score: 16    End of Session Equipment Utilized During Treatment: Gait belt;Rolling walker (2 wheels)  OT Visit Diagnosis: Unsteadiness on feet (R26.81);Muscle weakness (generalized) (M62.81)   Activity Tolerance Patient tolerated treatment well   Patient Left in chair;in bed;with nursing/sitter in room;with call bell/phone within reach (sitting EOB)   Nurse Communication          Time: 9528-4132 OT Time Calculation (min): 21 min  Charges: OT General Charges $OT Visit: 1 Visit OT Treatments $Self Care/Home Management : 8-22 mins   Margaretmary Eddy [facility] 02/28/2021, 4:04 PM

## 2021-02-28 NOTE — Progress Notes (Signed)
[facility] Progress Note   James [name] is a 76 yo male with a history of HTN, HLD, systolic congestive heart failure, CAD, CVA, AAA, and CKD. He reported that he has had weeping of his legs bilaterally for the past 2 weeks. Admitted to the hospital for cellulitis treatment with IV cefepime antibiotics. Assessment/ Plan:   Assessment/Plan: Acute on CKD3a? Creatinine was in the 1.3-1.6 range in 11/2019 and then next labs are this admission so unclear if his renal disease progressed. We will need to find out if he has any chemistry panels at his PCP [facility] in [place]- awaiting - Cr overall improvement for the last couple of days- 2.93 from 3.07 _ K improving. K+ 3.9 from 4.7 - Good response to diuretics-->switched to PO torsemide 40 mg BID -  no obstruction on US - Renal diet with 1.2L fluid restriction - OK to d/c from renal perspective as all parameters improving.  Will arrange OP f/u with [place] in office.    -Monitor Daily I/Os, Daily weight  -Maintain MAP>65 for optimal renal perfusion.  -Avoid nephrotoxic medications including NSAIDs   Hyperkalemia secondary to the renal dysfunction + potassium replacement + diet (bananas) at home. - Continued response to diuretics will help - Lokelma 10gm PO BID, improved - K+ 3.9 on 02/27/21 3.   Hyponatremia- gradual decrease in NA over the past 4 days. Today, his Na is 131. Anticipate Na will correct with continued diuresis. Will continue to monitor electrolytes.   4.   HFrEF - Echo shows EF 40-45%; on diuretics at this time. 5.   Cellulitis- on cefepime 2g q 24hr per primary 6.   DM - per primary team and on SSI as well as glargine 10 U at bedtime 7.   HTN - Controlled and BP readings reviewed 9.   Anemia - will follow for now, likely has a component of anemia from chronic disease/ inflammatory state. Transfuse as needed, would not given IV iron with possible cellulitis and no ESA indicated.  Subjective:    Cr improving, feeling  well.  Tolerating PO diuretics.      Objective:   BP (!) 115/51 (BP Location: Left Arm)    Pulse 67    Temp 97.7 F (36.5 C) (Oral)    Resp 16    Ht 5\' 9"  (1.753 m)    Wt 107.5 kg    SpO2 96%    BMI 35.00 kg/m   Intake/Output Summary (Last 24 hours) at 02/28/2021 1320 Last data filed at 02/28/2021 0819 Gross per 24 hour  Intake 730 ml  Output 2500 ml  Net -1770 ml   Weight change: 0.2 kg  Physical Exam: Gen: alert and in bed CVS: RRR Resp: coarse bilaterally Abd: soft and non tender Ext: 1+ LE edema, wrapped  Imaging: No results found.  Labs: BMET Recent Labs  Lab 02/23/21 0942 02/23/21 2012 02/24/21 0315 02/25/21 0325 02/26/21 1041 02/27/21 0146 02/28/21 0908  NA 135 137 136 135 132* 131* 134*  K 6.4* 5.8* 5.6* 5.2* 4.7 3.9 4.2  CL 109 108 108 105 101 101 103  CO2 16* 20* 19* 21* 18* 19* 20*  GLUCOSE 149* 192* 112* 92 112* 99 128*  BUN 77* 74* 73* 79* 88* 83* 87*  CREATININE 2.92* 3.01* 3.08* 2.98* 3.07* 2.93* 2.77*  CALCIUM 8.7* 8.4* 8.5* 8.6* 8.2* 8.2* 8.6*   CBC Recent Labs  Lab 02/23/21 0303 02/23/21 0744 02/26/21 1041  WBC 12.7*  --  8.9  NEUTROABS  --  7.9*  --   HGB 12.9*  --  12.0*  HCT 40.0  --  36.1*  MCV 91.1  --  87.6  PLT 327  --  281    Medications:     atorvastatin  80 mg Oral Daily   clopidogrel  75 mg Oral Daily   heparin  5,000 Units Subcutaneous Q8H   insulin aspart  0-15 Units Subcutaneous TID WC   insulin glargine-yfgn  10 Units Subcutaneous QHS   pramipexole  1.5 mg Oral QHS   sodium chloride flush  3 mL Intravenous Q12H   torsemide  40 mg Oral BID      Madelon Lips, MD 02/28/2021, 1:20 PM

## 2021-02-28 NOTE — Progress Notes (Signed)
Heart Failure Navigator Progress Note  Assessed for Heart & Vascular TOC clinic readiness.  Patient does not meet criteria due to advanced CKD -  pending renal recovery.   Navigator available for reassessment of patient.   Sharen Hones, PharmD, BCPS Heart Failure Stewardship Pharmacist Phone [phone]

## 2021-02-28 NOTE — Progress Notes (Signed)
Mobility Specialist: Progress Note   02/28/21 1240  Mobility  Activity Ambulated in hall  Level of Assistance Minimal assist, patient does 75% or more  Assistive Device Front wheel walker  Distance Ambulated (ft) 124 ft  Mobility Ambulated with assistance in hallway  Mobility Response Tolerated well  Mobility performed by Mobility specialist  $Mobility charge 1 Mobility   Pre-Mobility: 63 HR During Mobility: 104 HR Post-Mobility: 80 HR, 113/86 BP  Pt contact guard to sit EOB and minA to stand from EOB. Stopped x2 for brief standing breaks d/t SOB, otherwise asymptomatic. Pt back to bed after walk with call bell and phone at his side.   [facility] Kelcy Laible Mobility Specialist Mobility Specialist 4 [place]: [phone] Mobility Specialist 2 [place] and 6 [place]: [phone]

## 2021-02-28 NOTE — Progress Notes (Signed)
Patient d/c home with Home Health Services; Daughter [name] providing transportation. Mikal Plane, BSN, RN

## 2021-02-28 NOTE — TOC Transition Note (Signed)
Transition of Care [facility]) - CM/SW Discharge Note   Patient Details  Name: ARMONDO CECH MRN: [id number] Date of Birth: 09-10-1945  [facility]) CM/SW Contact:  Gala Lewandowsky, RN Phone Number: 02/28/2021, 4:17 PM   Clinical Narrative: Frances Furbish is aware that the patient will be discharged today. Daughter was contacted and she will be here in ten minutes to assist with travel home via private vehicle. No further needs from Case Manager at this time.   Final next level of care: Home w Home Health Services Barriers to Discharge: Continued Medical Work up   Patient Goals and CMS Choice Patient states their goals for this hospitalization and ongoing recovery are:: to return home as soon as possible CMS Medicare.gov Compare Post Acute Care list provided to:: Patient Choice offered to / list presented to : Adult Children, Patient  Discharge Plan and Services   Discharge Planning Services: CM Consult Post Acute Care Choice: Home Health            DME Agency: NA       HH Arranged: RN, Disease Management, PT, OT HH Agency: [facility] Date [facility] Agency Contacted: 02/27/21 Time HH Agency Contacted: 1620 Representative spoke with at [facility] Agency: Kandee Keen  Social Determinants of Health (SDOH) Interventions     Readmission Risk Interventions Readmission Risk Prevention Plan 02/27/2021  Transportation Screening Complete  HRI or Home Care Consult Complete  Social Work Consult for Recovery Care Planning/Counseling Complete  Palliative Care Screening Not Applicable  Medication Review Oceanographer) Complete  Some recent data might be hidden

## 2021-03-02 DIAGNOSIS — S81801D Unspecified open wound, right lower leg, subsequent encounter: Secondary | ICD-10-CM | POA: Diagnosis not present

## 2021-03-02 DIAGNOSIS — S81802D Unspecified open wound, left lower leg, subsequent encounter: Secondary | ICD-10-CM | POA: Diagnosis not present

## 2021-03-02 DIAGNOSIS — N179 Acute kidney failure, unspecified: Secondary | ICD-10-CM | POA: Diagnosis not present

## 2021-03-02 DIAGNOSIS — N1832 Chronic kidney disease, stage 3b: Secondary | ICD-10-CM | POA: Diagnosis not present

## 2021-03-02 DIAGNOSIS — I130 Hypertensive heart and chronic kidney disease with heart failure and stage 1 through stage 4 chronic kidney disease, or unspecified chronic kidney disease: Secondary | ICD-10-CM | POA: Diagnosis not present

## 2021-03-02 DIAGNOSIS — S91002D Unspecified open wound, left ankle, subsequent encounter: Secondary | ICD-10-CM | POA: Diagnosis not present

## 2021-03-02 DIAGNOSIS — D631 Anemia in chronic kidney disease: Secondary | ICD-10-CM | POA: Diagnosis not present

## 2021-03-02 DIAGNOSIS — E1122 Type 2 diabetes mellitus with diabetic chronic kidney disease: Secondary | ICD-10-CM | POA: Diagnosis not present

## 2021-03-02 DIAGNOSIS — I5042 Chronic combined systolic (congestive) and diastolic (congestive) heart failure: Secondary | ICD-10-CM | POA: Diagnosis not present

## 2021-03-02 NOTE — Discharge Summary (Signed)
Physician Discharge Summary  James [name] [facility] IOM:[id number] DOB: 09/18/1945 DOA: 02/23/2021  PCP: Koren Shiver, DO  Admit date: 02/23/2021 Discharge date: 02/28/2021  Admitted From: Home.  Disposition:  Home.   Recommendations for Outpatient Follow-up:  Follow up with PCP in 1-2 weeks Please obtain BMP/CBC in one week Please follow up with nephrology outpatient as scheduled.   Home Health:Yes   Discharge Condition:stable.  CODE STATUS:full code.  Diet recommendation: Heart Healthy   Brief/Interim Summary:    James [name] is a 76 y.o. male with medical history significant of hypertension, hyperlipidemia, systolic congestive heart failure, CAD, CVA, AAA, and CKD presents with complaints of his legs weeping over the last 2 weeks.  He noted that his orders were covered in the drainage from his wounds and noted that there was a foul odor to it.   He was admitted for acute on chronic combined CHF, acute on stage 3b CKD, and cellulitis of the lower extremities.   Discharge Diagnoses:  Principal Problem:   Cellulitis Active Problems:   Acute CHF (congestive heart failure) (HCC)   History of CVA (cerebrovascular accident)   Hyperkalemia   Acute kidney injury superimposed on chronic kidney disease (HCC)   Normocytic anemia  Cellulitis of the lower extremities Much improved with cellulitis, transitioned to oral antibiotics on discharge.  Lower extremity duplex negative for DVT.  Wound care consulted and recommendations given dressing changes. Patient afebrile, WBC within normal limits.           Acute on chronic combined CHF Echocardiogram showed left ventricular ejection fraction of 40 to 45%.The left ventricle  demonstrates global hypokinesis. Left ventricular diastolic parameters are consistent with Grade II diastolic  dysfunction (pseudonormalization). Elevated left atrial pressure. Diuresed appropriately.  Plan for torsemide 40 mg BID.  Renal diet with 1.2 fluid  restriction.          Acute on stage IIIb CKD and hyperkalemia Potassium within normal limits Creatinine improving. Recommend outpatient follow up with nephrology.    Hyponatremia;  Possibly sec to AKI.  Much improved.        Hyperlipidemia Continue with Lipitor.     History of CVA Continue with Plavix.     Anemia of chronic disease Continue to monitor       Type 2 diabetes mellitus uncontrolled with hyperglycemia.  Insulin-dependent Hemoglobin A1c around 7.6 Resume home meds on discharge.     Discharge Instructions  Discharge Instructions     Diet - low sodium heart healthy   Complete by: As directed    Discharge wound care:   Complete by: As directed    Wound care to bilateral LE full and partial thickness areas of skin loss:  wash LEs with soap and water, rinse and pat dry. Cover lesions with folded pieces of xeroform gauze Hart [place]  [phone]), top with ABD pads and secure with Kerlix roll gauze/paper tape.  Change daily. Float heels off of bed surface.   Increase activity slowly   Complete by: As directed       Allergies as of 02/28/2021   No Active Allergies      Medication List     STOP taking these medications    ezetimibe 10 MG tablet Commonly known as: ZETIA   iron polysaccharides 150 MG capsule Commonly known as: NIFEREX   isosorbide mononitrate 60 MG 24 hr tablet Commonly known as: IMDUR   lisinopril 20 MG tablet Commonly known as: ZESTRIL   metFORMIN 500 MG 24 hr tablet  Commonly known as: GLUCOPHAGE-XR   metoprolol succinate 50 MG 24 hr tablet Commonly known as: TOPROL-XL   nitroGLYCERIN 0.4 MG SL tablet Commonly known as: Nitrostat   Ozempic (0.25 or 0.5 MG/DOSE) 2 MG/1.5ML Sopn Generic drug: Semaglutide(0.25 or 0.5MG /DOS)       TAKE these medications    acetaminophen 500 MG tablet Commonly known as: TYLENOL Take 1,000 mg by mouth 2 (two) times daily as needed for headache (pain).   amoxicillin-clavulanate 500-125 MG  tablet Commonly known as: Augmentin Take 1 tablet (500 mg total) by mouth 2 (two) times daily for 4 days.   atorvastatin 80 MG tablet Commonly known as: LIPITOR TAKE ONE TABLET BY MOUTH ONCE DAILY What changed: when to take this   B-D ULTRAFINE III SHORT PEN 31G X 8 MM Misc Generic drug: Insulin Pen Needle Inject 1 each into the skin as directed.   UltiCare Short Pen Needles 31G X 8 MM Misc Generic drug: Insulin Pen Needle Use as directed under the skin two times daily   clopidogrel 75 MG tablet Commonly known as: PLAVIX Take 75 mg by mouth daily with breakfast.   diclofenac Sodium 1 % Gel Commonly known as: VOLTAREN 1 application 2 (two) times daily as needed (pain).   FreeStyle Libre 2 Sensor Misc USE TO CHECK BLOOD SUGAR AND CHANGE EVERY 14 DAYS   NovoLOG FlexPen 100 UNIT/ML FlexPen Generic drug: insulin aspart Inject 10 Units into the skin 2 (two) times daily.   potassium chloride SA 20 MEQ tablet Commonly known as: KLOR-CON M Take 20 mEq by mouth daily with breakfast.   pramipexole 0.75 MG tablet Commonly known as: MIRAPEX TAKE ONE TABLET BY MOUTH TWICE DAILY What changed:  how much to take when to take this   SM Alcohol Prep 70 % Pads 1 each by Other route as needed.   Torsemide 40 MG Tabs Take 40 mg by mouth 2 (two) times daily. What changed:  medication strength how much to take when to take this   Toujeo SoloStar 300 UNIT/ML Solostar Pen Generic drug: insulin glargine (1 Unit Dial) INJECT 22 UNITS SUBCUTANEOUSLY ONCE DAILY What changed:  how much to take how to take this when to take this               Discharge Care Instructions  (From admission, onward)           Start     Ordered   02/28/21 0000  Discharge wound care:       Comments: Wound care to bilateral LE full and partial thickness areas of skin loss:  wash LEs with soap and water, rinse and pat dry. Cover lesions with folded pieces of xeroform gauze Kellie Simmering  [phone]), top  with ABD pads and secure with Kerlix roll gauze/paper tape.  Change daily. Float heels off of bed surface.   02/28/21 1247            Follow-up Information     [facility] [place] [facility] Follow up.   Specialty: Home Health Services Why: Registered Nurse, Physical and Occupational Therapy-office to call with visit times. Contact information: [address] [place] [address] [phone]         Lyman Bishop, DO. Schedule an appointment as soon as possible for a visit in 1 week(s).   Specialty: Family Medicine Contact information: [place] [address] [phone]         Sueanne Margarita, MD .  Specialty: Cardiology Contact information: [id number] [address] [phone]                No Active Allergies  Consultations: Nephrology.    Procedures/Studies: US RENAL  Result Date: 02/24/2021 CLINICAL DATA:  Acute kidney injury. EXAM: RENAL / URINARY TRACT ULTRASOUND COMPLETE COMPARISON:  CT, 07/24/2017. FINDINGS: Right Kidney: Renal measurements: 10.4 x 4.7 x 5.1 cm = volume: 132 mL. Echogenicity within normal limits. No mass or hydronephrosis visualized. Left Kidney: Renal measurements: 10.8 x 5.6 x 5.1 cm = volume: 161 mL. Normal parenchymal echogenicity. Cyst arises from the midpole measuring 4.6 x 4.2 x 4.3 cm. No other masses, no stones and no hydronephrosis. Bladder: Appears normal for degree of bladder distention. Other: None. IMPRESSION: 1. No acute findings.  No hydronephrosis. 2. 4.6 cm simple appearing left midpole renal cyst. No other abnormalities. Electronically Signed   By: Lajean Manes M.D.   On: 02/24/2021 15:26   DG CHEST PORT 1 VIEW  Result Date: 02/23/2021 CLINICAL DATA:  Shortness of breath and edema. EXAM: PORTABLE CHEST 1 VIEW COMPARISON:  09/09/2019 FINDINGS: 0700 hours. The cardio pericardial silhouette is enlarged. There is pulmonary vascular congestion without overt  pulmonary edema. Probable persistent retrocardiac collapse/consolidation and left pleural effusion not excluded. Parahilar subsegmental atelectasis noted left lung, as before. Right lung clear. The visualized bony structures of the thorax show no acute abnormality. IMPRESSION: 1. Cardiomegaly with probable retrocardiac left base collapse/consolidation. Small left effusion cannot be excluded. 2. Pulmonary vascular congestion without overt pulmonary edema. Electronically Signed   By: Misty Stanley M.D.   On: 02/23/2021 07:17   ECHOCARDIOGRAM COMPLETE  Result Date: 02/24/2021    ECHOCARDIOGRAM REPORT   Patient Name:   James [name] Date of Exam: 02/24/2021 Medical Rec #:  [id number]     Height:       69.0 in Accession #:    [id number]    Weight:       242.5 lb Date of Birth:  1946-02-05     BSA:          2.242 m Patient Age:    57 years      BP:           127/64 mmHg Patient Gender: M             HR:           72 bpm. Exam Location:  Inpatient Procedure: 2D Echo, Cardiac Doppler and Color Doppler Indications:    CHF-Acute systolic  History:        Patient has prior history of Echocardiogram examinations. CHF,                 CAD, Prior CABG; Risk Factors:Hypertension, Diabetes and                 Dyslipidemia. AAA.  Sonographer:    Clayton Lefort RDCS (AE) Referring Phys: [id number] RONDELL A SMITH IMPRESSIONS  1. Technically difficult due to poor sound wave transmission; probable mild LV dysfunction with EF 45; consider cardiac MRI to more definitively define LV function if clinicalliy indicated.  2. Left ventricular ejection fraction, by estimation, is 40 to 45%. The left ventricle has mildly decreased function. The left ventricle demonstrates global hypokinesis. Left ventricular diastolic parameters are consistent with Grade II diastolic dysfunction (pseudonormalization). Elevated left atrial pressure.  3. Right ventricular systolic function is normal. The right ventricular size is mildly enlarged.  4. Right atrial size  was mildly dilated.  5. The mitral valve is normal in structure. Trivial mitral valve regurgitation. No evidence of mitral stenosis.  6. The aortic valve is tricuspid. Aortic valve regurgitation is not visualized. Aortic valve sclerosis is present, with no evidence of aortic valve stenosis.  7. The inferior vena cava is normal in size with greater than 50% respiratory variability, suggesting right atrial pressure of 3 mmHg. FINDINGS  Left Ventricle: Left ventricular ejection fraction, by estimation, is 40 to 45%. The left ventricle has mildly decreased function. The left ventricle demonstrates global hypokinesis. The left ventricular internal cavity size was normal in size. There is  no left ventricular hypertrophy. Left ventricular diastolic parameters are consistent with Grade II diastolic dysfunction (pseudonormalization). Elevated left atrial pressure. Right Ventricle: The right ventricular size is mildly enlarged. Right ventricular systolic function is normal. Left Atrium: Left atrial size was normal in size. Right Atrium: Right atrial size was mildly dilated. Pericardium: There is no evidence of pericardial effusion. Mitral Valve: The mitral valve is normal in structure. Trivial mitral valve regurgitation. No evidence of mitral valve stenosis. Tricuspid Valve: The tricuspid valve is normal in structure. Tricuspid valve regurgitation is trivial. No evidence of tricuspid stenosis. Aortic Valve: The aortic valve is tricuspid. Aortic valve regurgitation is not visualized. Aortic valve sclerosis is present, with no evidence of aortic valve stenosis. Aortic valve mean gradient measures 3.0 mmHg. Aortic valve peak gradient measures 5.7  mmHg. Aortic valve area, by VTI measures 3.98 cm. Pulmonic Valve: The pulmonic valve was not well visualized. Pulmonic valve regurgitation is mild. No evidence of pulmonic stenosis. Aorta: The aortic root is normal in size and structure. Venous: The inferior vena cava is normal in  size with greater than 50% respiratory variability, suggesting right atrial pressure of 3 mmHg. IAS/Shunts: The interatrial septum was not well visualized. Additional Comments: Technically difficult due to poor sound wave transmission; probable mild LV dysfunction with EF 45; consider cardiac MRI to more definitively define LV function if clinicalliy indicated.  LEFT VENTRICLE PLAX 2D LVOT diam:     2.40 cm   Diastology LV SV:         92        LV e' medial:    5.87 cm/s LV SV Index:   41        LV E/e' medial:  17.2 LVOT Area:     4.52 cm  LV e' lateral:   11.10 cm/s                          LV E/e' lateral: 9.1  RIGHT VENTRICLE             IVC RV Basal diam:  4.20 cm     IVC diam: 1.50 cm RV Mid diam:    3.90 cm RV S prime:     13.10 cm/s TAPSE (M-mode): 1.8 cm LEFT ATRIUM             Index        RIGHT ATRIUM           Index LA Vol (A2C):   49.1 ml 21.90 ml/m  RA Area:     31.30 cm LA Vol (A4C):   67.3 ml 30.01 ml/m  RA Volume:   101.00 ml 45.04 ml/m LA Biplane Vol: 59.3 ml 26.45 ml/m  AORTIC VALVE AV Area (Vmax):    4.30 cm AV Area (Vmean):   3.94 cm AV Area (VTI):  3.98 cm AV Vmax:           119.00 cm/s AV Vmean:          82.400 cm/s AV VTI:            0.232 m AV Peak Grad:      5.7 mmHg AV Mean Grad:      3.0 mmHg LVOT Vmax:         113.00 cm/s LVOT Vmean:        71.800 cm/s LVOT VTI:          0.204 m LVOT/AV VTI ratio: 0.88  AORTA Ao Root diam: 3.40 cm Ao Asc diam:  3.50 cm MITRAL VALVE MV Area (PHT): 5.66 cm     SHUNTS MV Decel Time: 134 msec     Systemic VTI:  0.20 m MV E velocity: 101.00 cm/s  Systemic Diam: 2.40 cm MV A velocity: 66.60 cm/s MV E/A ratio:  1.52 Kirk Ruths MD Electronically signed by Kirk Ruths MD Signature Date/Time: 02/24/2021/10:00:51 AM    Final    VAS [place] LOWER EXTREMITY VENOUS (DVT)  Result Date: 02/24/2021  Lower Venous DVT Study Patient Name:  James [name]  Date of Exam:   02/24/2021 Medical Rec #: [id number]      Accession #:    [id number] Date of Birth:  1945-09-26      Patient Gender: M Patient Age:   32 years Exam Location:  [facility] Procedure:      VAS [place] LOWER EXTREMITY VENOUS (DVT) Referring Phys: Fuller Plan --------------------------------------------------------------------------------  Indications: Swelling.  Limitations: Bandages. Comparison       07-25-2017 Bilateral lower extremity venous was negative for Study:           DVT. Performing Technologist: Darlin Coco RDMS, RVT  Examination Guidelines: A complete evaluation includes B-mode imaging, spectral Doppler, color Doppler, and power Doppler as needed of all accessible portions of each vessel. Bilateral testing is considered an integral part of a complete examination. Limited examinations for reoccurring indications may be performed as noted. The reflux portion of the exam is performed with the patient in reverse Trendelenburg.  +---------+---------------+---------+-----------+----------+-------------------+  RIGHT     Compressibility Phasicity Spontaneity Properties Thrombus Aging       +---------+---------------+---------+-----------+----------+-------------------+  CFV       Full            Yes       Yes                                         +---------+---------------+---------+-----------+----------+-------------------+  SFJ       Full                                                                  +---------+---------------+---------+-----------+----------+-------------------+  FV Prox   Full                                                                  +---------+---------------+---------+-----------+----------+-------------------+  FV Mid    Full                                                                  +---------+---------------+---------+-----------+----------+-------------------+  FV Distal Full                                                                  +---------+---------------+---------+-----------+----------+-------------------+  PFV       Full                                                                   +---------+---------------+---------+-----------+----------+-------------------+  POP       Full            Yes       Yes                                         +---------+---------------+---------+-----------+----------+-------------------+  PTV                                                        Unable to visualize                                                              due to bandaging     +---------+---------------+---------+-----------+----------+-------------------+  PERO                                                       Unable to visualize                                                              due to bandaging     +---------+---------------+---------+-----------+----------+-------------------+   +---------+---------------+---------+-----------+----------+-------------------+  LEFT      Compressibility Phasicity Spontaneity Properties Thrombus Aging       +---------+---------------+---------+-----------+----------+-------------------+  CFV       Full            Yes       Yes                                         +---------+---------------+---------+-----------+----------+-------------------+  SFJ       Full                                                                  +---------+---------------+---------+-----------+----------+-------------------+  FV Prox   Full                                                                  +---------+---------------+---------+-----------+----------+-------------------+  FV Mid    Full                                                                  +---------+---------------+---------+-----------+----------+-------------------+  FV Distal Full                                                                  +---------+---------------+---------+-----------+----------+-------------------+  PFV       Full                                                                   +---------+---------------+---------+-----------+----------+-------------------+  POP       Full            Yes       Yes                                         +---------+---------------+---------+-----------+----------+-------------------+  PTV                                                        Unable to visualize                                                              due to bandaging     +---------+---------------+---------+-----------+----------+-------------------+  PERO  Unable to visualize                                                              due to bandaging     +---------+---------------+---------+-----------+----------+-------------------+     Summary: RIGHT: - There is no evidence of deep vein thrombosis in the lower extremity. However, portions of this examination were limited- see technologist comments above.  - No cystic structure found in the popliteal fossa.  LEFT: - There is no evidence of deep vein thrombosis in the lower extremity. However, portions of this examination were limited- see technologist comments above.  - No cystic structure found in the popliteal fossa.  *See table(s) above for measurements and observations. Electronically signed by Orlie Pollen on 02/24/2021 at 2:34:31 PM.    Final       Subjective: No chest pain or sob.   Discharge Exam: Vitals:   02/28/21 0500 02/28/21 0920  BP: 115/65 (!) 115/51  Pulse: 67   Resp: 16   Temp: 97.8 F (36.6 C) 97.7 F (36.5 C)  SpO2: 96%    Vitals:   02/27/21 1701 02/27/21 2100 02/28/21 0500 02/28/21 0920  BP: 110/64 120/61 115/65 (!) 115/51  Pulse: 65 62 67   Resp: 16 16 16    Temp: 97.8 F (36.6 C) 97.8 F (36.6 C) 97.8 F (36.6 C) 97.7 F (36.5 C)  TempSrc:  Oral Oral Oral  SpO2:  95% 96%   Weight:   107.5 kg   Height:        General: Pt is alert, awake, not in acute distress Cardiovascular: RRR, S1/S2 +, no rubs, no gallops Respiratory: CTA  bilaterally, no wheezing, no rhonchi Abdominal: Soft, NT, ND, bowel sounds + Extremities: no edema, no cyanosis    The results of significant diagnostics from this hospitalization (including imaging, microbiology, ancillary and laboratory) are listed below for reference.     Microbiology: Recent Results (from the past 240 hour(s))  Resp Panel by RT-PCR (Flu A&B, Covid) Nasopharyngeal Swab     Status: None   Collection Time: 02/23/21  6:41 AM   Specimen: Nasopharyngeal Swab; Nasopharyngeal(NP) swabs in vial transport medium  Result Value Ref Range Status   SARS Coronavirus 2 by RT PCR NEGATIVE NEGATIVE Final    Comment: (NOTE) SARS-CoV-2 target nucleic acids are NOT DETECTED.  The SARS-CoV-2 RNA is generally detectable in upper respiratory specimens during the acute phase of infection. The lowest concentration of SARS-CoV-2 viral copies this assay can detect is 138 copies/mL. A negative result does not preclude SARS-Cov-2 infection and should not be used as the sole basis for treatment or other patient management decisions. A negative result may occur with  improper specimen collection/handling, submission of specimen other than nasopharyngeal swab, presence of viral mutation(s) within the areas targeted by this assay, and inadequate number of viral copies(<138 copies/mL). A negative result must be combined with clinical observations, patient history, and epidemiological information. The expected result is Negative.  Fact Sheet for Patients:  EntrepreneurPulse.com.au  Fact Sheet for Healthcare Providers:  IncredibleEmployment.be  This test is no t yet approved or cleared by the [place] FDA and  has been authorized for detection and/or diagnosis of SARS-CoV-2 by FDA under an Emergency Use Authorization (EUA). This EUA will remain  in effect (meaning this  test can be used) for the duration of the COVID-19 declaration under Section  564(b)(1) of the Act, 21 U.S.C.section 360bbb-3(b)(1), unless the authorization is terminated  or revoked sooner.       Influenza A by PCR NEGATIVE NEGATIVE Final   Influenza B by PCR NEGATIVE NEGATIVE Final    Comment: (NOTE) The Xpert Xpress SARS-CoV-2/FLU/RSV plus assay is intended as an aid in the diagnosis of influenza from Nasopharyngeal swab specimens and should not be used as a sole basis for treatment. Nasal washings and aspirates are unacceptable for Xpert Xpress SARS-CoV-2/FLU/RSV testing.  Fact Sheet for Patients: EntrepreneurPulse.com.au  Fact Sheet for Healthcare Providers: IncredibleEmployment.be  This test is not yet approved or cleared by the [place] FDA and has been authorized for detection and/or diagnosis of SARS-CoV-2 by FDA under an Emergency Use Authorization (EUA). This EUA will remain in effect (meaning this test can be used) for the duration of the COVID-19 declaration under Section 564(b)(1) of the Act, 21 U.S.C. section 360bbb-3(b)(1), unless the authorization is terminated or revoked.  Performed at [facility], [place] [address] [place], [place] [address]   Blood culture (routine x 2)     Status: None   Collection Time: 02/23/21  7:33 AM   Specimen: BLOOD RIGHT HAND  Result Value Ref Range Status   Specimen Description BLOOD RIGHT HAND  Final   Special Requests   Final    BOTTLES DRAWN AEROBIC AND ANAEROBIC Blood Culture results may not be optimal due to an inadequate volume of blood received in culture bottles   Culture   Final    NO GROWTH 5 DAYS Performed at [facility], [place] [address] [place], [place] [address]    Report Status 02/28/2021 FINAL  Final  Blood culture (routine x 2)     Status: None   Collection Time: 02/23/21  7:44 AM   Specimen: BLOOD  Result Value Ref Range Status   Specimen Description BLOOD SITE NOT SPECIFIED  Final   Special Requests   Final    BOTTLES DRAWN  AEROBIC AND ANAEROBIC Blood Culture adequate volume   Culture   Final    NO GROWTH 5 DAYS Performed at [facility], [place] [address], [place], [place] [address]    Report Status 02/28/2021 FINAL  Final     Labs: BNP (last 3 results) Recent Labs    02/23/21 0941  BNP [id number]   Basic Metabolic Panel: Recent Labs  Lab 02/24/21 0315 02/25/21 0325 02/26/21 1041 02/27/21 0146 02/28/21 0908  NA 136 135 132* 131* 134*  K 5.6* 5.2* 4.7 3.9 4.2  CL 108 105 101 101 103  CO2 19* 21* 18* 19* 20*  GLUCOSE 112* 92 112* 99 128*  BUN 73* 79* 88* 83* 87*  CREATININE 3.08* 2.98* 3.07* 2.93* 2.77*  CALCIUM 8.5* 8.6* 8.2* 8.2* 8.6*   Liver Function Tests: No results for input(s): AST, ALT, ALKPHOS, BILITOT, PROT, ALBUMIN in the last 168 hours. No results for input(s): LIPASE, AMYLASE in the last 168 hours. No results for input(s): AMMONIA in the last 168 hours. CBC: Recent Labs  Lab 02/26/21 1041  WBC 8.9  HGB 12.0*  HCT 36.1*  MCV 87.6  PLT 281   Cardiac Enzymes: Recent Labs  Lab 02/23/21 2012  CKTOTAL 470*   BNP: Invalid input(s): POCBNP CBG: Recent Labs  Lab 02/27/21 1609 02/27/21 2135 02/28/21 0808 02/28/21 1149 02/28/21 1548  GLUCAP 99 134* 136* 112* 124*   D-Dimer  No results for input(s): DDIMER in the last 72 hours. Hgb A1c No results for input(s): HGBA1C in the last 72 hours. Lipid Profile No results for input(s): CHOL, HDL, LDLCALC, TRIG, CHOLHDL, LDLDIRECT in the last 72 hours. Thyroid function studies No results for input(s): TSH, T4TOTAL, T3FREE, THYROIDAB in the last 72 hours.  Invalid input(s): FREET3 Anemia work up No results for input(s): VITAMINB12, FOLATE, FERRITIN, TIBC, IRON, RETICCTPCT in the last 72 hours. Urinalysis    Component Value Date/Time   COLORURINE YELLOW 02/24/2021 1425   APPEARANCEUR CLEAR 02/24/2021 1425   LABSPEC 1.010 02/24/2021 1425   PHURINE 5.5 02/24/2021 1425   GLUCOSEU NEGATIVE 02/24/2021 1425   HGBUR NEGATIVE  02/24/2021 [place] 02/24/2021 1425   KETONESUR NEGATIVE 02/24/2021 1425   PROTEINUR NEGATIVE 02/24/2021 1425   UROBILINOGEN 1.0 10/24/2011 1742   NITRITE NEGATIVE 02/24/2021 1425   LEUKOCYTESUR NEGATIVE 02/24/2021 1425   Sepsis Labs Invalid input(s): PROCALCITONIN,  WBC,  LACTICIDVEN Microbiology Recent Results (from the past 240 hour(s))  Resp Panel by RT-PCR (Flu A&B, Covid) Nasopharyngeal Swab     Status: None   Collection Time: 02/23/21  6:41 AM   Specimen: Nasopharyngeal Swab; Nasopharyngeal(NP) swabs in vial transport medium  Result Value Ref Range Status   SARS Coronavirus 2 by RT PCR NEGATIVE NEGATIVE Final    Comment: (NOTE) SARS-CoV-2 target nucleic acids are NOT DETECTED.  The SARS-CoV-2 RNA is generally detectable in upper respiratory specimens during the acute phase of infection. The lowest concentration of SARS-CoV-2 viral copies this assay can detect is 138 copies/mL. A negative result does not preclude SARS-Cov-2 infection and should not be used as the sole basis for treatment or other patient management decisions. A negative result may occur with  improper specimen collection/handling, submission of specimen other than nasopharyngeal swab, presence of viral mutation(s) within the areas targeted by this assay, and inadequate number of viral copies(<138 copies/mL). A negative result must be combined with clinical observations, patient history, and epidemiological information. The expected result is Negative.  Fact Sheet for Patients:  EntrepreneurPulse.com.au  Fact Sheet for Healthcare Providers:  IncredibleEmployment.be  This test is no t yet approved or cleared by the [place] FDA and  has been authorized for detection and/or diagnosis of SARS-CoV-2 by FDA under an Emergency Use Authorization (EUA). This EUA will remain  in effect (meaning this test can be used) for the duration of the COVID-19  declaration under Section 564(b)(1) of the Act, 21 U.S.C.section 360bbb-3(b)(1), unless the authorization is terminated  or revoked sooner.       Influenza A by PCR NEGATIVE NEGATIVE Final   Influenza B by PCR NEGATIVE NEGATIVE Final    Comment: (NOTE) The Xpert Xpress SARS-CoV-2/FLU/RSV plus assay is intended as an aid in the diagnosis of influenza from Nasopharyngeal swab specimens and should not be used as a sole basis for treatment. Nasal washings and aspirates are unacceptable for Xpert Xpress SARS-CoV-2/FLU/RSV testing.  Fact Sheet for Patients: EntrepreneurPulse.com.au  Fact Sheet for Healthcare Providers: IncredibleEmployment.be  This test is not yet approved or cleared by the [place] FDA and has been authorized for detection and/or diagnosis of SARS-CoV-2 by FDA under an Emergency Use Authorization (EUA). This EUA will remain in effect (meaning this test can be used) for the duration of the COVID-19 declaration under Section 564(b)(1) of the Act, 21 U.S.C. section 360bbb-3(b)(1), unless the authorization is terminated or revoked.  Performed at [facility], [place] [address] [place], [place]  [address]   Blood culture (routine x 2)     Status: None   Collection Time: 02/23/21  7:33 AM   Specimen: BLOOD RIGHT HAND  Result Value Ref Range Status   Specimen Description BLOOD RIGHT HAND  Final   Special Requests   Final    BOTTLES DRAWN AEROBIC AND ANAEROBIC Blood Culture results may not be optimal due to an inadequate volume of blood received in culture bottles   Culture   Final    NO GROWTH 5 DAYS Performed at [facility], [place] [address], [place], [place] [address]    Report Status 02/28/2021 FINAL  Final  Blood culture (routine x 2)     Status: None   Collection Time: 02/23/21  7:44 AM   Specimen: BLOOD  Result Value Ref Range Status   Specimen Description BLOOD SITE NOT SPECIFIED  Final   Special Requests    Final    BOTTLES DRAWN AEROBIC AND ANAEROBIC Blood Culture adequate volume   Culture   Final    NO GROWTH 5 DAYS Performed at [facility], [place] [address] [place], [place] [address]    Report Status 02/28/2021 FINAL  Final     Time coordinating discharge: 40 minutes.   SIGNED:   Hosie Poisson, MD  [facility] 03/02/2021, 7:18 PM

## 2021-03-18 DIAGNOSIS — E119 Type 2 diabetes mellitus without complications: Secondary | ICD-10-CM | POA: Diagnosis not present

## 2021-03-26 DIAGNOSIS — R6889 Other general symptoms and signs: Secondary | ICD-10-CM | POA: Diagnosis not present

## 2021-03-26 DIAGNOSIS — I872 Venous insufficiency (chronic) (peripheral): Secondary | ICD-10-CM | POA: Diagnosis not present

## 2021-03-26 DIAGNOSIS — I5032 Chronic diastolic (congestive) heart failure: Secondary | ICD-10-CM | POA: Diagnosis not present

## 2021-03-26 DIAGNOSIS — L03119 Cellulitis of unspecified part of limb: Secondary | ICD-10-CM | POA: Diagnosis not present

## 2021-03-29 ENCOUNTER — Other Ambulatory Visit (HOSPITAL_BASED_OUTPATIENT_CLINIC_OR_DEPARTMENT_OTHER): Payer: Self-pay

## 2021-04-15 DIAGNOSIS — I2510 Atherosclerotic heart disease of native coronary artery without angina pectoris: Secondary | ICD-10-CM | POA: Diagnosis not present

## 2021-04-15 DIAGNOSIS — E1122 Type 2 diabetes mellitus with diabetic chronic kidney disease: Secondary | ICD-10-CM | POA: Diagnosis not present

## 2021-04-15 DIAGNOSIS — E782 Mixed hyperlipidemia: Secondary | ICD-10-CM | POA: Diagnosis not present

## 2021-04-15 DIAGNOSIS — N183 Chronic kidney disease, stage 3 unspecified: Secondary | ICD-10-CM | POA: Diagnosis not present

## 2021-04-15 DIAGNOSIS — I5042 Chronic combined systolic (congestive) and diastolic (congestive) heart failure: Secondary | ICD-10-CM | POA: Diagnosis not present

## 2021-04-15 DIAGNOSIS — I130 Hypertensive heart and chronic kidney disease with heart failure and stage 1 through stage 4 chronic kidney disease, or unspecified chronic kidney disease: Secondary | ICD-10-CM | POA: Diagnosis not present

## 2021-05-21 DIAGNOSIS — I10 Essential (primary) hypertension: Secondary | ICD-10-CM | POA: Diagnosis not present

## 2021-05-21 DIAGNOSIS — I872 Venous insufficiency (chronic) (peripheral): Secondary | ICD-10-CM | POA: Diagnosis not present

## 2021-05-21 DIAGNOSIS — I5032 Chronic diastolic (congestive) heart failure: Secondary | ICD-10-CM | POA: Diagnosis not present

## 2021-06-18 DIAGNOSIS — E1121 Type 2 diabetes mellitus with diabetic nephropathy: Secondary | ICD-10-CM | POA: Diagnosis not present

## 2021-06-18 DIAGNOSIS — E1122 Type 2 diabetes mellitus with diabetic chronic kidney disease: Secondary | ICD-10-CM | POA: Diagnosis not present

## 2021-06-18 DIAGNOSIS — E782 Mixed hyperlipidemia: Secondary | ICD-10-CM | POA: Diagnosis not present

## 2021-06-18 DIAGNOSIS — I10 Essential (primary) hypertension: Secondary | ICD-10-CM | POA: Diagnosis not present

## 2021-06-18 DIAGNOSIS — I5042 Chronic combined systolic (congestive) and diastolic (congestive) heart failure: Secondary | ICD-10-CM | POA: Diagnosis not present

## 2021-07-01 DIAGNOSIS — L723 Sebaceous cyst: Secondary | ICD-10-CM | POA: Diagnosis not present

## 2021-07-01 DIAGNOSIS — E1121 Type 2 diabetes mellitus with diabetic nephropathy: Secondary | ICD-10-CM | POA: Diagnosis not present

## 2021-08-30 ENCOUNTER — Other Ambulatory Visit (HOSPITAL_BASED_OUTPATIENT_CLINIC_OR_DEPARTMENT_OTHER): Payer: Self-pay

## 2021-09-03 DIAGNOSIS — I25118 Atherosclerotic heart disease of native coronary artery with other forms of angina pectoris: Secondary | ICD-10-CM | POA: Diagnosis not present

## 2021-09-03 DIAGNOSIS — I10 Essential (primary) hypertension: Secondary | ICD-10-CM | POA: Diagnosis not present

## 2021-09-03 DIAGNOSIS — I69398 Other sequelae of cerebral infarction: Secondary | ICD-10-CM | POA: Diagnosis not present

## 2021-09-03 DIAGNOSIS — H6122 Impacted cerumen, left ear: Secondary | ICD-10-CM | POA: Diagnosis not present

## 2021-09-03 DIAGNOSIS — I872 Venous insufficiency (chronic) (peripheral): Secondary | ICD-10-CM | POA: Diagnosis not present

## 2021-09-03 DIAGNOSIS — Z0000 Encounter for general adult medical examination without abnormal findings: Secondary | ICD-10-CM | POA: Diagnosis not present

## 2021-09-03 DIAGNOSIS — I700 Atherosclerosis of aorta: Secondary | ICD-10-CM | POA: Diagnosis not present

## 2021-09-03 DIAGNOSIS — E782 Mixed hyperlipidemia: Secondary | ICD-10-CM | POA: Diagnosis not present

## 2021-09-03 DIAGNOSIS — E1121 Type 2 diabetes mellitus with diabetic nephropathy: Secondary | ICD-10-CM | POA: Diagnosis not present

## 2021-09-05 ENCOUNTER — Telehealth: Payer: Self-pay | Admitting: Internal Medicine

## 2021-09-05 NOTE — Telephone Encounter (Signed)
I spoke with the patient and his app is not working on his phone. I ordered the patient a bedside monitor. He should receive it in 7-10 business days.

## 2021-09-05 NOTE — Telephone Encounter (Signed)
  1. Has your device fired? no  2. Is you device beeping? no  3. Are you experiencing draining or swelling at device site? no  4. Are you calling to see if we received your device transmission? no  5. Have you passed out? no  Patient was told by his PCP that his device was not working anymore. He wanted to ask Dr. Ladona Ridgel to know what to do   Please route to Device Clinic Pool

## 2021-10-07 ENCOUNTER — Ambulatory Visit (INDEPENDENT_AMBULATORY_CARE_PROVIDER_SITE_OTHER): Payer: Medicare HMO

## 2021-10-07 DIAGNOSIS — I639 Cerebral infarction, unspecified: Secondary | ICD-10-CM

## 2021-10-10 LAB — CUP PACEART REMOTE DEVICE CHECK
Date Time Interrogation Session: 20230824075130
Implantable Pulse Generator Implant Date: 20210310

## 2021-10-14 DIAGNOSIS — I5042 Chronic combined systolic (congestive) and diastolic (congestive) heart failure: Secondary | ICD-10-CM | POA: Diagnosis not present

## 2021-10-14 DIAGNOSIS — E782 Mixed hyperlipidemia: Secondary | ICD-10-CM | POA: Diagnosis not present

## 2021-10-14 DIAGNOSIS — I10 Essential (primary) hypertension: Secondary | ICD-10-CM | POA: Diagnosis not present

## 2021-10-14 DIAGNOSIS — E1122 Type 2 diabetes mellitus with diabetic chronic kidney disease: Secondary | ICD-10-CM | POA: Diagnosis not present

## 2021-10-14 DIAGNOSIS — E1121 Type 2 diabetes mellitus with diabetic nephropathy: Secondary | ICD-10-CM | POA: Diagnosis not present

## 2021-11-02 NOTE — Progress Notes (Signed)
Carelink Summary Report / Loop Recorder 

## 2021-11-11 ENCOUNTER — Ambulatory Visit (INDEPENDENT_AMBULATORY_CARE_PROVIDER_SITE_OTHER): Payer: Medicare HMO

## 2021-11-11 DIAGNOSIS — I639 Cerebral infarction, unspecified: Secondary | ICD-10-CM | POA: Diagnosis not present

## 2021-11-11 LAB — CUP PACEART REMOTE DEVICE CHECK
Date Time Interrogation Session: 20230922230440
Implantable Pulse Generator Implant Date: 20210310

## 2021-11-20 NOTE — Progress Notes (Signed)
Carelink Summary Report / Loop Recorder 

## 2021-12-13 LAB — CUP PACEART REMOTE DEVICE CHECK
Date Time Interrogation Session: 20231025230425
Implantable Pulse Generator Implant Date: 20210310

## 2021-12-16 ENCOUNTER — Ambulatory Visit (INDEPENDENT_AMBULATORY_CARE_PROVIDER_SITE_OTHER): Payer: Medicare HMO

## 2021-12-16 DIAGNOSIS — I639 Cerebral infarction, unspecified: Secondary | ICD-10-CM

## 2021-12-17 DIAGNOSIS — E1165 Type 2 diabetes mellitus with hyperglycemia: Secondary | ICD-10-CM | POA: Diagnosis not present

## 2022-01-08 DIAGNOSIS — E1121 Type 2 diabetes mellitus with diabetic nephropathy: Secondary | ICD-10-CM | POA: Diagnosis not present

## 2022-01-08 DIAGNOSIS — N183 Chronic kidney disease, stage 3 unspecified: Secondary | ICD-10-CM | POA: Diagnosis not present

## 2022-01-08 DIAGNOSIS — E1122 Type 2 diabetes mellitus with diabetic chronic kidney disease: Secondary | ICD-10-CM | POA: Diagnosis not present

## 2022-01-08 DIAGNOSIS — I10 Essential (primary) hypertension: Secondary | ICD-10-CM | POA: Diagnosis not present

## 2022-01-08 DIAGNOSIS — E782 Mixed hyperlipidemia: Secondary | ICD-10-CM | POA: Diagnosis not present

## 2022-01-08 DIAGNOSIS — I5042 Chronic combined systolic (congestive) and diastolic (congestive) heart failure: Secondary | ICD-10-CM | POA: Diagnosis not present

## 2022-01-08 DIAGNOSIS — I0981 Rheumatic heart failure: Secondary | ICD-10-CM | POA: Diagnosis not present

## 2022-01-14 NOTE — Progress Notes (Signed)
Carelink Summary Report / Loop Recorder 

## 2022-01-20 ENCOUNTER — Ambulatory Visit (INDEPENDENT_AMBULATORY_CARE_PROVIDER_SITE_OTHER): Payer: Medicare HMO

## 2022-01-20 DIAGNOSIS — I639 Cerebral infarction, unspecified: Secondary | ICD-10-CM | POA: Diagnosis not present

## 2022-01-20 LAB — CUP PACEART REMOTE DEVICE CHECK
Date Time Interrogation Session: 20231203230724
Implantable Pulse Generator Implant Date: 20210310

## 2022-01-22 ENCOUNTER — Telehealth: Payer: Self-pay | Admitting: *Deleted

## 2022-01-22 NOTE — Patient Outreach (Signed)
  Care Coordination   01/22/2022 Name: James [name] MRN: [id number] DOB: 01/02/1946   Care Coordination Outreach Attempts:  An unsuccessful telephone outreach was attempted today to offer the patient information about available care coordination services as a benefit of their health plan.   Follow Up Plan:  Additional outreach attempts will be made to offer the patient care coordination information and services.   Encounter Outcome:  No Answer   Care Coordination Interventions:  No, not indicated    Elliot Cousin, RN Care Management Coordinator [facility] Main Office [phone]

## 2022-02-18 DIAGNOSIS — Z23 Encounter for immunization: Secondary | ICD-10-CM | POA: Diagnosis not present

## 2022-02-18 DIAGNOSIS — I10 Essential (primary) hypertension: Secondary | ICD-10-CM | POA: Diagnosis not present

## 2022-02-18 DIAGNOSIS — I130 Hypertensive heart and chronic kidney disease with heart failure and stage 1 through stage 4 chronic kidney disease, or unspecified chronic kidney disease: Secondary | ICD-10-CM | POA: Diagnosis not present

## 2022-02-18 DIAGNOSIS — I700 Atherosclerosis of aorta: Secondary | ICD-10-CM | POA: Diagnosis not present

## 2022-02-18 DIAGNOSIS — I0981 Rheumatic heart failure: Secondary | ICD-10-CM | POA: Diagnosis not present

## 2022-02-18 DIAGNOSIS — E1121 Type 2 diabetes mellitus with diabetic nephropathy: Secondary | ICD-10-CM | POA: Diagnosis not present

## 2022-02-18 DIAGNOSIS — I5042 Chronic combined systolic (congestive) and diastolic (congestive) heart failure: Secondary | ICD-10-CM | POA: Diagnosis not present

## 2022-02-18 DIAGNOSIS — I69351 Hemiplegia and hemiparesis following cerebral infarction affecting right dominant side: Secondary | ICD-10-CM | POA: Diagnosis not present

## 2022-02-18 DIAGNOSIS — E1122 Type 2 diabetes mellitus with diabetic chronic kidney disease: Secondary | ICD-10-CM | POA: Diagnosis not present

## 2022-02-18 DIAGNOSIS — E782 Mixed hyperlipidemia: Secondary | ICD-10-CM | POA: Diagnosis not present

## 2022-02-24 ENCOUNTER — Ambulatory Visit (INDEPENDENT_AMBULATORY_CARE_PROVIDER_SITE_OTHER): Payer: Medicare HMO

## 2022-02-24 DIAGNOSIS — I639 Cerebral infarction, unspecified: Secondary | ICD-10-CM

## 2022-02-24 LAB — CUP PACEART REMOTE DEVICE CHECK
Date Time Interrogation Session: 20240107231348
Implantable Pulse Generator Implant Date: 20210310

## 2022-02-26 NOTE — Progress Notes (Signed)
Carelink Summary Report / Loop Recorder 

## 2022-03-04 DIAGNOSIS — L723 Sebaceous cyst: Secondary | ICD-10-CM | POA: Diagnosis not present

## 2022-03-11 DIAGNOSIS — E1122 Type 2 diabetes mellitus with diabetic chronic kidney disease: Secondary | ICD-10-CM | POA: Diagnosis not present

## 2022-03-27 NOTE — Progress Notes (Signed)
Carelink Summary Report / Loop Recorder 

## 2022-03-30 LAB — CUP PACEART REMOTE DEVICE CHECK
Date Time Interrogation Session: 20240209230551
Implantable Pulse Generator Implant Date: 20210310

## 2022-03-31 ENCOUNTER — Ambulatory Visit: Payer: Medicare HMO

## 2022-03-31 DIAGNOSIS — I639 Cerebral infarction, unspecified: Secondary | ICD-10-CM | POA: Diagnosis not present

## 2022-04-03 DIAGNOSIS — D3132 Benign neoplasm of left choroid: Secondary | ICD-10-CM | POA: Diagnosis not present

## 2022-04-03 DIAGNOSIS — H35033 Hypertensive retinopathy, bilateral: Secondary | ICD-10-CM | POA: Diagnosis not present

## 2022-04-03 DIAGNOSIS — I6609 Occlusion and stenosis of unspecified middle cerebral artery: Secondary | ICD-10-CM | POA: Diagnosis not present

## 2022-04-03 DIAGNOSIS — E119 Type 2 diabetes mellitus without complications: Secondary | ICD-10-CM | POA: Diagnosis not present

## 2022-04-03 DIAGNOSIS — H34231 Retinal artery branch occlusion, right eye: Secondary | ICD-10-CM | POA: Diagnosis not present

## 2022-04-10 ENCOUNTER — Encounter (INDEPENDENT_AMBULATORY_CARE_PROVIDER_SITE_OTHER): Payer: Self-pay | Admitting: Ophthalmology

## 2022-04-11 DIAGNOSIS — E782 Mixed hyperlipidemia: Secondary | ICD-10-CM | POA: Diagnosis not present

## 2022-04-11 DIAGNOSIS — I10 Essential (primary) hypertension: Secondary | ICD-10-CM | POA: Diagnosis not present

## 2022-04-11 DIAGNOSIS — I130 Hypertensive heart and chronic kidney disease with heart failure and stage 1 through stage 4 chronic kidney disease, or unspecified chronic kidney disease: Secondary | ICD-10-CM | POA: Diagnosis not present

## 2022-04-11 DIAGNOSIS — E1121 Type 2 diabetes mellitus with diabetic nephropathy: Secondary | ICD-10-CM | POA: Diagnosis not present

## 2022-04-11 DIAGNOSIS — N183 Chronic kidney disease, stage 3 unspecified: Secondary | ICD-10-CM | POA: Diagnosis not present

## 2022-04-14 ENCOUNTER — Encounter (INDEPENDENT_AMBULATORY_CARE_PROVIDER_SITE_OTHER): Payer: Medicare HMO | Admitting: Ophthalmology

## 2022-04-28 DIAGNOSIS — H903 Sensorineural hearing loss, bilateral: Secondary | ICD-10-CM | POA: Diagnosis not present

## 2022-05-05 ENCOUNTER — Ambulatory Visit (INDEPENDENT_AMBULATORY_CARE_PROVIDER_SITE_OTHER): Payer: Medicare HMO

## 2022-05-05 DIAGNOSIS — I639 Cerebral infarction, unspecified: Secondary | ICD-10-CM

## 2022-05-05 LAB — CUP PACEART REMOTE DEVICE CHECK
Date Time Interrogation Session: 20240317231242
Implantable Pulse Generator Implant Date: 20210310

## 2022-05-08 DIAGNOSIS — E1121 Type 2 diabetes mellitus with diabetic nephropathy: Secondary | ICD-10-CM | POA: Diagnosis not present

## 2022-05-08 DIAGNOSIS — N183 Chronic kidney disease, stage 3 unspecified: Secondary | ICD-10-CM | POA: Diagnosis not present

## 2022-05-08 DIAGNOSIS — I10 Essential (primary) hypertension: Secondary | ICD-10-CM | POA: Diagnosis not present

## 2022-05-08 DIAGNOSIS — E782 Mixed hyperlipidemia: Secondary | ICD-10-CM | POA: Diagnosis not present

## 2022-05-08 DIAGNOSIS — I130 Hypertensive heart and chronic kidney disease with heart failure and stage 1 through stage 4 chronic kidney disease, or unspecified chronic kidney disease: Secondary | ICD-10-CM | POA: Diagnosis not present

## 2022-05-08 DIAGNOSIS — M150 Primary generalized (osteo)arthritis: Secondary | ICD-10-CM | POA: Diagnosis not present

## 2022-05-08 DIAGNOSIS — I0981 Rheumatic heart failure: Secondary | ICD-10-CM | POA: Diagnosis not present

## 2022-05-08 DIAGNOSIS — E1122 Type 2 diabetes mellitus with diabetic chronic kidney disease: Secondary | ICD-10-CM | POA: Diagnosis not present

## 2022-05-08 DIAGNOSIS — I5042 Chronic combined systolic (congestive) and diastolic (congestive) heart failure: Secondary | ICD-10-CM | POA: Diagnosis not present

## 2022-05-12 DIAGNOSIS — R6889 Other general symptoms and signs: Secondary | ICD-10-CM | POA: Diagnosis not present

## 2022-05-12 DIAGNOSIS — H6123 Impacted cerumen, bilateral: Secondary | ICD-10-CM | POA: Diagnosis not present

## 2022-05-13 NOTE — Progress Notes (Signed)
Carelink Summary Report / Loop Recorder 

## 2022-05-14 DIAGNOSIS — R6889 Other general symptoms and signs: Secondary | ICD-10-CM | POA: Diagnosis not present

## 2022-06-02 DIAGNOSIS — H903 Sensorineural hearing loss, bilateral: Secondary | ICD-10-CM | POA: Diagnosis not present

## 2022-06-02 DIAGNOSIS — R6889 Other general symptoms and signs: Secondary | ICD-10-CM | POA: Diagnosis not present

## 2022-06-09 ENCOUNTER — Ambulatory Visit (INDEPENDENT_AMBULATORY_CARE_PROVIDER_SITE_OTHER): Payer: Medicare HMO

## 2022-06-09 DIAGNOSIS — I639 Cerebral infarction, unspecified: Secondary | ICD-10-CM | POA: Diagnosis not present

## 2022-06-09 LAB — CUP PACEART REMOTE DEVICE CHECK
Date Time Interrogation Session: 20240419230419
Implantable Pulse Generator Implant Date: 20210310

## 2022-06-11 NOTE — Progress Notes (Signed)
Carelink Summary Report / Loop Recorder 

## 2022-07-01 DIAGNOSIS — R6889 Other general symptoms and signs: Secondary | ICD-10-CM | POA: Diagnosis not present

## 2022-07-03 DIAGNOSIS — E1122 Type 2 diabetes mellitus with diabetic chronic kidney disease: Secondary | ICD-10-CM | POA: Diagnosis not present

## 2022-07-03 DIAGNOSIS — E782 Mixed hyperlipidemia: Secondary | ICD-10-CM | POA: Diagnosis not present

## 2022-07-03 DIAGNOSIS — I700 Atherosclerosis of aorta: Secondary | ICD-10-CM | POA: Diagnosis not present

## 2022-07-03 DIAGNOSIS — E1142 Type 2 diabetes mellitus with diabetic polyneuropathy: Secondary | ICD-10-CM | POA: Diagnosis not present

## 2022-07-03 DIAGNOSIS — I110 Hypertensive heart disease with heart failure: Secondary | ICD-10-CM | POA: Diagnosis not present

## 2022-07-03 DIAGNOSIS — I25119 Atherosclerotic heart disease of native coronary artery with unspecified angina pectoris: Secondary | ICD-10-CM | POA: Diagnosis not present

## 2022-07-03 DIAGNOSIS — I69351 Hemiplegia and hemiparesis following cerebral infarction affecting right dominant side: Secondary | ICD-10-CM | POA: Diagnosis not present

## 2022-07-03 DIAGNOSIS — I5042 Chronic combined systolic (congestive) and diastolic (congestive) heart failure: Secondary | ICD-10-CM | POA: Diagnosis not present

## 2022-07-10 NOTE — Progress Notes (Signed)
Carelink Summary Report / Loop Recorder 

## 2022-07-11 DIAGNOSIS — I25119 Atherosclerotic heart disease of native coronary artery with unspecified angina pectoris: Secondary | ICD-10-CM | POA: Diagnosis not present

## 2022-07-11 DIAGNOSIS — E1122 Type 2 diabetes mellitus with diabetic chronic kidney disease: Secondary | ICD-10-CM | POA: Diagnosis not present

## 2022-07-11 DIAGNOSIS — H35033 Hypertensive retinopathy, bilateral: Secondary | ICD-10-CM | POA: Diagnosis not present

## 2022-07-11 DIAGNOSIS — I10 Essential (primary) hypertension: Secondary | ICD-10-CM | POA: Diagnosis not present

## 2022-07-11 DIAGNOSIS — E782 Mixed hyperlipidemia: Secondary | ICD-10-CM | POA: Diagnosis not present

## 2022-07-11 DIAGNOSIS — I0981 Rheumatic heart failure: Secondary | ICD-10-CM | POA: Diagnosis not present

## 2022-07-11 DIAGNOSIS — I130 Hypertensive heart and chronic kidney disease with heart failure and stage 1 through stage 4 chronic kidney disease, or unspecified chronic kidney disease: Secondary | ICD-10-CM | POA: Diagnosis not present

## 2022-07-11 DIAGNOSIS — N183 Chronic kidney disease, stage 3 unspecified: Secondary | ICD-10-CM | POA: Diagnosis not present

## 2022-07-11 DIAGNOSIS — M150 Primary generalized (osteo)arthritis: Secondary | ICD-10-CM | POA: Diagnosis not present

## 2022-07-15 ENCOUNTER — Ambulatory Visit (INDEPENDENT_AMBULATORY_CARE_PROVIDER_SITE_OTHER): Payer: Medicare HMO

## 2022-07-15 DIAGNOSIS — I639 Cerebral infarction, unspecified: Secondary | ICD-10-CM | POA: Diagnosis not present

## 2022-07-15 LAB — CUP PACEART REMOTE DEVICE CHECK
Date Time Interrogation Session: 20240527230016
Implantable Pulse Generator Implant Date: 20210310

## 2022-07-30 DIAGNOSIS — E1122 Type 2 diabetes mellitus with diabetic chronic kidney disease: Secondary | ICD-10-CM | POA: Diagnosis not present

## 2022-08-06 NOTE — Progress Notes (Signed)
Carelink Summary Report / Loop Recorder 

## 2022-08-18 ENCOUNTER — Ambulatory Visit: Payer: Medicare HMO

## 2022-08-18 DIAGNOSIS — I639 Cerebral infarction, unspecified: Secondary | ICD-10-CM | POA: Diagnosis not present

## 2022-08-18 LAB — CUP PACEART REMOTE DEVICE CHECK
Date Time Interrogation Session: 20240629230021
Implantable Pulse Generator Implant Date: 20210310

## 2022-09-03 NOTE — Progress Notes (Signed)
 Carelink Summary Report / Loop Recorder 

## 2022-09-22 ENCOUNTER — Ambulatory Visit (INDEPENDENT_AMBULATORY_CARE_PROVIDER_SITE_OTHER): Payer: Medicare HMO

## 2022-09-22 DIAGNOSIS — I639 Cerebral infarction, unspecified: Secondary | ICD-10-CM

## 2022-10-01 ENCOUNTER — Other Ambulatory Visit: Payer: Self-pay

## 2022-10-01 ENCOUNTER — Emergency Department (HOSPITAL_BASED_OUTPATIENT_CLINIC_OR_DEPARTMENT_OTHER)
Admission: EM | Admit: 2022-10-01 | Discharge: 2022-10-01 | Disposition: A | Discharge status: Home / Self Care | Payer: Medicare HMO | Source: Home / Self Care | Attending: Emergency Medicine | Admitting: Emergency Medicine

## 2022-10-01 ENCOUNTER — Encounter (HOSPITAL_BASED_OUTPATIENT_CLINIC_OR_DEPARTMENT_OTHER): Payer: Self-pay

## 2022-10-01 DIAGNOSIS — M7989 Other specified soft tissue disorders: Secondary | ICD-10-CM | POA: Diagnosis not present

## 2022-10-01 DIAGNOSIS — Z951 Presence of aortocoronary bypass graft: Secondary | ICD-10-CM | POA: Diagnosis not present

## 2022-10-01 DIAGNOSIS — I509 Heart failure, unspecified: Secondary | ICD-10-CM | POA: Diagnosis not present

## 2022-10-01 DIAGNOSIS — R2240 Localized swelling, mass and lump, unspecified lower limb: Secondary | ICD-10-CM | POA: Diagnosis not present

## 2022-10-01 DIAGNOSIS — I10 Essential (primary) hypertension: Secondary | ICD-10-CM | POA: Insufficient documentation

## 2022-10-01 LAB — CBG MONITORING, ED: Glucose-Capillary: 180 mg/dL — ABNORMAL HIGH (ref 70–99)

## 2022-10-01 NOTE — ED Triage Notes (Addendum)
Pt c/o increased leg swelling Hx of DM, kidney and heart issues Legs are weeping Glucometer has been broken Cbg 180 in triage

## 2022-10-01 NOTE — Discharge Instructions (Signed)
You were evaluated in the Emergency Department and after careful evaluation, we did not find any emergent condition requiring admission or further testing in the hospital.  Your exam/testing today is overall reassuring.  Recommend compression socks or stockings, use these every day.  Also elevate your legs as often as you can.  Also take your Lasix and other medications as directed.  Follow-up closely with your regular doctors.  Please return to the Emergency Department if you experience any worsening of your condition.   Thank you for allowing [place] to be a part of your care.

## 2022-10-01 NOTE — ED Provider Notes (Signed)
[facility] Emergency Department Provider Note MRN:  [id number]  Arrival date & time: 10/01/22     Chief Complaint   Leg Swelling   History of Present Illness   James [name] is a 77 y.o. year-old male with a history of CHF presenting to the ED with chief complaint of leg swelling.  Swelling to bilateral legs chronically.  Left leg is always a bit more swollen than the right.  No significant change to the legs other than the left leg started weeping fluid tonight.  Not having any fever, no chest pain or shortness of breath, no other complaints.  Review of Systems  A thorough review of systems was obtained and all systems are negative except as noted in the HPI and PMH.   Patient's Health History    Past Medical History:  Diagnosis Date   Abnormal EKG 03/09/2013   Abnormal nuclear cardiac imaging test 11/14/2013   Acute lacunar infarction [facility]) 02/06/2019   Aortic atherosclerosis (HCC) 07/07/2017   Arthritis    "all over" (09/30/2017)   Ascending aortic aneurysm (HCC)    cMRI (2/15):  Normal LV EF 54%, Mild BAE, Trileaflet Aortic Valve, Upper limits of normal ascending aorta 3.8 cm, Normal aortic arch 2.3 cm with bovine origin of left carotid   Atrial flutter (HCC)    Post-op with no reoccurence   Chronic diastolic heart failure (HCC)    Echo (1/15):  Left ventricle: The cavity size was mildly dilated. Mild LVH. EF 55% to 60%. Wall motion was normal; Gr 1 diastolic dysfunction Left atrium: The atrium was mildly dilated.  Right atrium: The atrium was mildly to moderately dilated.  Aortic arch measures 4.8 cm (moderately dilated); suggest CTA or MRA to better assess.   Chronic systolic CHF (congestive heart failure) (HCC) 07/24/2017   CKD (chronic kidney disease), stage III (HCC) 07/24/2017   Coronary artery disease 10/2011   a. s/p cabg;  b. Myoview (9/15):  ant ischemia, poss TID, EF 43%, high risk >> LHC (9/15):  Dist LM 50, pLAD 90 then 100, pCFX 50, mCFX  75, oRCA 75, dRCA 95, L-LAD patent, S-Dx 100, S-OM1 patent, S-PDA patent, EF 55% >> native Dx small caliber and not well suited for PCI; native RCA amenable to PCI and would restore flow to PLA branches but would jeopardize SVG-RCA  -  Med Rx   Deviated nasal septum    Dilated aortic root (HCC)    Diverticulitis of sigmoid colon 07/24/2017   DM (diabetes mellitus), type 2, uncontrolled with complications    onset 2013   Dyslipidemia    Edema 02/09/2013   GERD (gastroesophageal reflux disease)    Hx of Doppler ultrasound    Carotid US (9/13):  No ICA stenosis   Hypertension    Pleural thickening 07/24/2017    Past Surgical History:  Procedure Laterality Date   CORONARY ARTERY BYPASS GRAFT  10/27/2011   Procedure: CORONARY ARTERY BYPASS GRAFTING (CABG);  Surgeon: Kerin Perna, MD;  Location: [facility] OR;  Service: Open Heart Surgery;  Laterality: N/A;  Coronary Artery Bypass Grafting times four using left internal mammary artery and right greater saphenous vein endoscopically harvested   CORONARY STENT INTERVENTION N/A 09/30/2017   Procedure: CORONARY STENT INTERVENTION;  Surgeon: Corky Crafts, MD;  Location: [facility] INVASIVE CV LAB;  Service: Cardiovascular;  Laterality: N/A;   KNEE ARTHROSCOPY Left 2003   KNEE ARTHROSCOPY Right 2006   LEFT HEART CATHETERIZATION WITH CORONARY ANGIOGRAM N/A 10/23/2011  Procedure: LEFT HEART CATHETERIZATION WITH CORONARY ANGIOGRAM;  Surgeon: Quintella Reichert, MD;  Location: [facility] CATH LAB;  Service: Cardiovascular;  Laterality: N/A;   LEFT HEART CATHETERIZATION WITH CORONARY ANGIOGRAM N/A 11/16/2013   Procedure: LEFT HEART CATHETERIZATION WITH CORONARY ANGIOGRAM;  Surgeon: Micheline Chapman, MD;  Location: [facility] CATH LAB;  Service: Cardiovascular;  Laterality: N/A;   RIGHT/LEFT HEART CATH AND CORONARY/GRAFT ANGIOGRAPHY N/A 09/30/2017   Procedure: RIGHT/LEFT HEART CATH AND CORONARY/GRAFT ANGIOGRAPHY;  Surgeon: Corky Crafts, MD;  Location: [facility] INVASIVE CV LAB;  Service:  Cardiovascular;  Laterality: N/A;    Family History  Problem Relation Age of Onset   Hypertension Mother    Stroke Father 2   Hypertension Father    Hypertension Paternal Grandfather    Heart attack Neg Hx     Social History   Socioeconomic History   Marital status: Divorced    Spouse name: Not on file   Number of children: Not on file   Years of education: Not on file   Highest education level: Not on file  Occupational History   Occupation: retired  Tobacco Use   Smoking status: Never   Smokeless tobacco: Never  Vaping Use   Vaping status: Never Used  Substance and Sexual Activity   Alcohol use: Not Currently    Comment: 09/30/2017 "1 beer a month or less"   Drug use: Never   Sexual activity: Not Currently  Other Topics Concern   Not on file  Social History Narrative   Not on file   Social Determinants of Health   Financial Resource Strain: Not on file  Food Insecurity: Low Risk  (05/12/2022)   [facility]   Food vital sign    Within the past 12 months, you worried that your food would run out before you got money to buy more: Never true    Within the past 12 months, the food you bought just didn't last and you didn't have money to get more. : Never true  Transportation Needs: No Transportation Needs (05/12/2022)   [facility]   Transportation    In the past 12 months, has lack of reliable transportation kept you from medical appointments, meetings, work or from getting things needed for daily living? : No  Physical Activity: Not on file  Stress: Not on file  Social Connections: Unknown (07/02/2021)   [facility]   Social Network    Social Network: Not on file  Intimate Partner Violence: Unknown (05/24/2021)   [facility]   HITS    Physically Hurt: Not on file    Insult or Talk Down To: Not on file    Threaten Physical Harm: Not on file     Scream or Curse: Not on file     Physical Exam   Vitals:   10/01/22 2211  BP: (!) 153/70  Pulse: 72  Resp: 20  Temp: 97.8 F (36.6 C)  SpO2: 95%    CONSTITUTIONAL: Chronically ill-appearing, NAD NEURO/PSYCH:  Alert and oriented x 3, no focal deficits EYES:  eyes equal and reactive ENT/NECK:  no LAD, no JVD CARDIO: Regular rate, well-perfused, normal S1 and S2 PULM:  CTAB no wheezing or rhonchi GI/GU:  non-distended, non-tender MSK/SPINE:  No gross deformities SKIN: Pitting edema to the bilateral lower extremities with chronic appearing erythema, small amount of weeping to the left lower extremity.   *Additional and/or pertinent findings included  in MDM below  Diagnostic and Interventional Summary    EKG Interpretation Date/Time:    Ventricular Rate:    PR Interval:    QRS Duration:    QT Interval:    QTC Calculation:   R Axis:      Text Interpretation:         Labs Reviewed  CBG MONITORING, ED - Abnormal; Notable for the following components:      Result Value   Glucose-Capillary 180 (*)    All other components within normal limits    No orders to display    Medications - No data to display   Procedures  /  Critical Care Procedures  ED Course and Medical Decision Making  Initial Impression and Ddx 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  Now there is some skin breakdown and weeping on the left side.  This is patient's only new complaint tonight.  No JVD, no increased work of breathing, not complaining of any chest pain or shortness of breath.  Vital signs reassuring.  Patient has not been fully compliant with his Lasix, has not been using compression socks because it is summertime.  Past medical/surgical history that increases complexity of ED encounter: CHF  Interpretation of Diagnostics   Patient Reassessment and Ultimate Disposition/Management     Discharge.  Wrapping patient's legs and nonadherent  dressing with Ace wrap.  He will follow-up with his primary care doctor.  Patient management required discussion with the following services or consulting groups:  None  Complexity of Problems Addressed Acute complicated illness or Injury  Additional Data Reviewed and Analyzed Further history obtained from: Recent discharge summary  Additional Factors Impacting ED Encounter Risk None  James Sow. Pilar Plate, MD [facility] Emergency Medicine [facility] [email or web address]  Final Clinical Impressions(s) / ED Diagnoses     ICD-10-CM   1. Leg swelling  M79.89       ED Discharge Orders     None        Discharge Instructions Discussed with and Provided to Patient:    Discharge Instructions      You were evaluated in the Emergency Department and after careful evaluation, we did not find any emergent condition requiring admission or further testing in the hospital.  Your exam/testing today is overall reassuring.  Recommend compression socks or stockings, use these every day.  Also elevate your legs as often as you can.  Also take your Lasix and other medications as directed.  Follow-up closely with your regular doctors.  Please return to the Emergency Department if you experience any worsening of your condition.   Thank you for allowing [place] to be a part of your care.      Sabas Sous, MD 10/01/22 [phone]

## 2022-10-01 NOTE — ED Notes (Signed)
Pt daughter updated of care, per pt request

## 2022-10-01 NOTE — ED Notes (Signed)
Offered to clean and change pt, pt declined

## 2022-10-02 NOTE — Progress Notes (Signed)
Carelink Summary Report / Loop Recorder 

## 2022-10-22 ENCOUNTER — Other Ambulatory Visit (HOSPITAL_BASED_OUTPATIENT_CLINIC_OR_DEPARTMENT_OTHER): Payer: Self-pay

## 2022-10-22 LAB — CUP PACEART REMOTE DEVICE CHECK
Date Time Interrogation Session: 20240903230236
Implantable Pulse Generator Implant Date: 20210310

## 2022-10-22 MED ORDER — POTASSIUM CHLORIDE ER 20 MEQ PO TBCR
20.0000 meq | EXTENDED_RELEASE_TABLET | Freq: Every day | ORAL | 3 refills | Status: AC
  Filled 2022-10-24: qty 30, 30d supply, fill #0

## 2022-10-22 MED ORDER — FREESTYLE LIBRE 14 DAY SENSOR MISC
11 refills | Status: AC
  Filled 2022-10-24: qty 2, 28d supply, fill #0

## 2022-10-22 MED ORDER — PRAMIPEXOLE DIHYDROCHLORIDE 0.75 MG PO TABS
1.5000 mg | ORAL_TABLET | Freq: Every day | ORAL | 3 refills | Status: AC
  Filled 2022-10-27 (×2): qty 180, 90d supply, fill #0

## 2022-10-22 MED ORDER — ACETAMINOPHEN EXTRA STRENGTH 500 MG PO TABS
500.0000 mg | ORAL_TABLET | Freq: Four times a day (QID) | ORAL | 3 refills | Status: AC | PRN
  Filled 2022-10-24: qty 100, 25d supply, fill #0

## 2022-10-22 MED ORDER — CLOPIDOGREL BISULFATE 75 MG PO TABS
75.0000 mg | ORAL_TABLET | Freq: Every day | ORAL | 3 refills | Status: AC
  Filled 2022-10-24: qty 30, 30d supply, fill #0
  Filled 2022-12-17: qty 90, 90d supply, fill #0

## 2022-10-22 MED ORDER — TRUEPLUS 5-BEVEL PEN NEEDLES 32G X 4 MM MISC: 5 refills | Status: AC

## 2022-10-22 MED ORDER — ATORVASTATIN CALCIUM 80 MG PO TABS
80.0000 mg | ORAL_TABLET | Freq: Every day | ORAL | 3 refills | Status: AC
  Filled 2022-10-24: qty 30, 30d supply, fill #0

## 2022-10-22 MED ORDER — GABAPENTIN 300 MG PO CAPS
600.0000 mg | ORAL_CAPSULE | Freq: Every day | ORAL | 3 refills | Status: AC
  Filled 2022-10-24: qty 60, 30d supply, fill #0

## 2022-10-22 MED ORDER — NOVOLOG FLEXPEN 100 UNIT/ML ~~LOC~~ SOPN
PEN_INJECTOR | SUBCUTANEOUS | 2 refills | Status: AC
  Filled 2022-10-24: qty 15, 31d supply, fill #0

## 2022-10-22 MED ORDER — HYDROCORTISONE 1 % EX CREA: 1.0000 | TOPICAL_CREAM | Freq: Every day | CUTANEOUS | 1 refills | Status: AC | PRN

## 2022-10-22 MED ORDER — TORSEMIDE 20 MG PO TABS: ORAL_TABLET | ORAL | 0 refills | Status: AC

## 2022-10-22 MED ORDER — LISINOPRIL 20 MG PO TABS
20.0000 mg | ORAL_TABLET | Freq: Every day | ORAL | 3 refills | Status: AC
  Filled 2022-10-24: qty 30, 30d supply, fill #0

## 2022-10-22 MED ORDER — TORSEMIDE 20 MG PO TABS
40.0000 mg | ORAL_TABLET | ORAL | 0 refills | Status: AC
  Filled 2022-10-24: qty 75, 30d supply, fill #0

## 2022-10-22 MED ORDER — TOUJEO SOLOSTAR 300 UNIT/ML ~~LOC~~ SOPN
22.0000 [IU] | PEN_INJECTOR | Freq: Every day | SUBCUTANEOUS | 3 refills | Status: AC
  Filled 2022-10-24: qty 4.5, 61d supply, fill #0

## 2022-10-24 ENCOUNTER — Other Ambulatory Visit (HOSPITAL_BASED_OUTPATIENT_CLINIC_OR_DEPARTMENT_OTHER): Payer: Self-pay

## 2022-10-25 ENCOUNTER — Other Ambulatory Visit: Payer: Self-pay

## 2022-10-27 ENCOUNTER — Other Ambulatory Visit (HOSPITAL_BASED_OUTPATIENT_CLINIC_OR_DEPARTMENT_OTHER): Payer: Self-pay

## 2022-10-27 ENCOUNTER — Ambulatory Visit (INDEPENDENT_AMBULATORY_CARE_PROVIDER_SITE_OTHER): Payer: Medicare HMO

## 2022-10-27 ENCOUNTER — Other Ambulatory Visit: Payer: Self-pay

## 2022-10-27 DIAGNOSIS — I639 Cerebral infarction, unspecified: Secondary | ICD-10-CM

## 2022-11-05 ENCOUNTER — Other Ambulatory Visit (HOSPITAL_BASED_OUTPATIENT_CLINIC_OR_DEPARTMENT_OTHER): Payer: Self-pay

## 2022-11-05 ENCOUNTER — Other Ambulatory Visit: Payer: Self-pay

## 2022-11-06 ENCOUNTER — Other Ambulatory Visit (HOSPITAL_BASED_OUTPATIENT_CLINIC_OR_DEPARTMENT_OTHER): Payer: Self-pay

## 2022-11-06 DIAGNOSIS — E1122 Type 2 diabetes mellitus with diabetic chronic kidney disease: Secondary | ICD-10-CM | POA: Diagnosis not present

## 2022-11-06 DIAGNOSIS — I5042 Chronic combined systolic (congestive) and diastolic (congestive) heart failure: Secondary | ICD-10-CM | POA: Diagnosis not present

## 2022-11-06 DIAGNOSIS — Z23 Encounter for immunization: Secondary | ICD-10-CM | POA: Diagnosis not present

## 2022-11-06 DIAGNOSIS — I10 Essential (primary) hypertension: Secondary | ICD-10-CM | POA: Diagnosis not present

## 2022-11-06 DIAGNOSIS — E782 Mixed hyperlipidemia: Secondary | ICD-10-CM | POA: Diagnosis not present

## 2022-11-06 DIAGNOSIS — I130 Hypertensive heart and chronic kidney disease with heart failure and stage 1 through stage 4 chronic kidney disease, or unspecified chronic kidney disease: Secondary | ICD-10-CM | POA: Diagnosis not present

## 2022-11-06 DIAGNOSIS — E1142 Type 2 diabetes mellitus with diabetic polyneuropathy: Secondary | ICD-10-CM | POA: Diagnosis not present

## 2022-11-06 DIAGNOSIS — I25119 Atherosclerotic heart disease of native coronary artery with unspecified angina pectoris: Secondary | ICD-10-CM | POA: Diagnosis not present

## 2022-11-06 DIAGNOSIS — N184 Chronic kidney disease, stage 4 (severe): Secondary | ICD-10-CM | POA: Diagnosis not present

## 2022-11-06 DIAGNOSIS — Z794 Long term (current) use of insulin: Secondary | ICD-10-CM | POA: Diagnosis not present

## 2022-11-06 MED ORDER — CLOPIDOGREL BISULFATE 75 MG PO TABS
ORAL_TABLET | ORAL | 3 refills | Status: AC
  Filled 2022-11-06: qty 90, 90d supply, fill #0

## 2022-11-06 MED ORDER — TOUJEO SOLOSTAR 300 UNIT/ML ~~LOC~~ SOPN
22.0000 [IU] | PEN_INJECTOR | Freq: Every day | SUBCUTANEOUS | 3 refills | Status: AC
  Filled 2022-11-06: qty 4.5, 56d supply, fill #0
  Filled 2022-11-07: qty 30, 30d supply, fill #0

## 2022-11-06 MED ORDER — ATORVASTATIN CALCIUM 80 MG PO TABS
ORAL_TABLET | ORAL | 3 refills | Status: AC
  Filled 2022-11-06: qty 90, 90d supply, fill #0

## 2022-11-06 MED ORDER — PRAMIPEXOLE DIHYDROCHLORIDE 0.75 MG PO TABS
1.5000 mg | ORAL_TABLET | Freq: Every day | ORAL | 3 refills | Status: AC
  Filled 2022-11-06 (×2): qty 180, 90d supply, fill #0

## 2022-11-06 MED ORDER — TORSEMIDE 20 MG PO TABS
ORAL_TABLET | ORAL | 3 refills | Status: AC
  Filled 2022-11-06: qty 90, 90d supply, fill #0

## 2022-11-06 MED ORDER — NOVOLOG FLEXPEN 100 UNIT/ML ~~LOC~~ SOPN
PEN_INJECTOR | SUBCUTANEOUS | 3 refills | Status: AC
  Filled 2022-11-06: qty 45, 90d supply, fill #0

## 2022-11-06 MED ORDER — GABAPENTIN 300 MG PO CAPS
ORAL_CAPSULE | ORAL | 3 refills | Status: AC
  Filled 2022-11-06: qty 180, 90d supply, fill #0

## 2022-11-06 MED ORDER — POTASSIUM CHLORIDE CRYS ER 20 MEQ PO TBCR
20.0000 meq | EXTENDED_RELEASE_TABLET | Freq: Every day | ORAL | 3 refills | Status: AC
  Filled 2022-11-06: qty 90, 90d supply, fill #0

## 2022-11-06 MED ORDER — AMLODIPINE BESYLATE 2.5 MG PO TABS
2.5000 mg | ORAL_TABLET | Freq: Every day | ORAL | 1 refills | Status: AC
  Filled 2022-11-06: qty 90, 90d supply, fill #0

## 2022-11-06 NOTE — Progress Notes (Signed)
Carelink Summary Report / Loop Recorder 

## 2022-11-07 ENCOUNTER — Other Ambulatory Visit: Payer: Self-pay

## 2022-11-07 ENCOUNTER — Other Ambulatory Visit (HOSPITAL_BASED_OUTPATIENT_CLINIC_OR_DEPARTMENT_OTHER): Payer: Self-pay

## 2022-11-12 ENCOUNTER — Other Ambulatory Visit: Payer: Self-pay

## 2022-11-14 ENCOUNTER — Other Ambulatory Visit (HOSPITAL_BASED_OUTPATIENT_CLINIC_OR_DEPARTMENT_OTHER): Payer: Self-pay

## 2022-11-18 ENCOUNTER — Other Ambulatory Visit (HOSPITAL_COMMUNITY): Payer: Self-pay

## 2022-11-24 ENCOUNTER — Ambulatory Visit (INDEPENDENT_AMBULATORY_CARE_PROVIDER_SITE_OTHER): Payer: Medicare HMO

## 2022-11-24 DIAGNOSIS — I639 Cerebral infarction, unspecified: Secondary | ICD-10-CM

## 2022-11-24 LAB — CUP PACEART REMOTE DEVICE CHECK
Date Time Interrogation Session: 20241006230112
Implantable Pulse Generator Implant Date: 20210310

## 2022-12-05 NOTE — Progress Notes (Signed)
Carelink Summary Report / Loop Recorder 

## 2022-12-17 ENCOUNTER — Other Ambulatory Visit: Payer: Self-pay

## 2022-12-29 ENCOUNTER — Ambulatory Visit (INDEPENDENT_AMBULATORY_CARE_PROVIDER_SITE_OTHER): Payer: Medicare HMO

## 2022-12-29 DIAGNOSIS — I639 Cerebral infarction, unspecified: Secondary | ICD-10-CM | POA: Diagnosis not present

## 2022-12-29 LAB — CUP PACEART REMOTE DEVICE CHECK
Date Time Interrogation Session: 20241110230134
Implantable Pulse Generator Implant Date: 20210310

## 2023-01-07 ENCOUNTER — Other Ambulatory Visit: Payer: Self-pay

## 2023-01-21 DIAGNOSIS — E1142 Type 2 diabetes mellitus with diabetic polyneuropathy: Secondary | ICD-10-CM | POA: Diagnosis not present

## 2023-01-21 DIAGNOSIS — R627 Adult failure to thrive: Secondary | ICD-10-CM | POA: Diagnosis not present

## 2023-01-21 DIAGNOSIS — Z794 Long term (current) use of insulin: Secondary | ICD-10-CM | POA: Diagnosis not present

## 2023-01-21 DIAGNOSIS — I25119 Atherosclerotic heart disease of native coronary artery with unspecified angina pectoris: Secondary | ICD-10-CM | POA: Diagnosis not present

## 2023-01-21 DIAGNOSIS — I5042 Chronic combined systolic (congestive) and diastolic (congestive) heart failure: Secondary | ICD-10-CM | POA: Diagnosis not present

## 2023-01-21 DIAGNOSIS — I872 Venous insufficiency (chronic) (peripheral): Secondary | ICD-10-CM | POA: Diagnosis not present

## 2023-01-21 DIAGNOSIS — E1122 Type 2 diabetes mellitus with diabetic chronic kidney disease: Secondary | ICD-10-CM | POA: Diagnosis not present

## 2023-01-21 DIAGNOSIS — E782 Mixed hyperlipidemia: Secondary | ICD-10-CM | POA: Diagnosis not present

## 2023-01-21 DIAGNOSIS — I110 Hypertensive heart disease with heart failure: Secondary | ICD-10-CM | POA: Diagnosis not present

## 2023-01-21 NOTE — Progress Notes (Signed)
Carelink Summary Report / Loop Recorder 

## 2023-01-30 DIAGNOSIS — E1122 Type 2 diabetes mellitus with diabetic chronic kidney disease: Secondary | ICD-10-CM | POA: Diagnosis not present

## 2023-02-02 ENCOUNTER — Ambulatory Visit (INDEPENDENT_AMBULATORY_CARE_PROVIDER_SITE_OTHER): Payer: Medicare HMO

## 2023-02-02 DIAGNOSIS — I639 Cerebral infarction, unspecified: Secondary | ICD-10-CM

## 2023-02-02 LAB — CUP PACEART REMOTE DEVICE CHECK
Date Time Interrogation Session: 20241215230116
Implantable Pulse Generator Implant Date: 20210310

## 2023-02-03 ENCOUNTER — Telehealth: Payer: Self-pay

## 2023-02-03 NOTE — Telephone Encounter (Signed)
ILR summary report received. Battery status OK. Normal device function. No new symptom, tachy, brady, episodes. No new AF episodes. Monthly summary reports and ROV/PRN 1 pause event 11/16 @ 05:48, 9sec per device - route to triage per protocol LA, CVRS Per GT please contact Pt and ensure no symptoms  JS    Reviewed w/ GT per JS RN. Likely false pause alert. Called pt. Unable to LM. Just need to see if he passed out or noticed anything 11/16

## 2023-02-03 NOTE — Telephone Encounter (Signed)
LMTCB

## 2023-02-05 NOTE — Telephone Encounter (Signed)
Attempted to call pts daughter Allyson Sabal on [place], phone number does is not her number. I removed the incorrect number from [facility].   Mychart is not set up.

## 2023-02-06 NOTE — Progress Notes (Signed)
Certified letter sent 

## 2023-02-06 NOTE — Telephone Encounter (Signed)
Attempted all numbers on DPR. Mychart is not set up. Certified letter sent.

## 2023-02-08 DIAGNOSIS — E1122 Type 2 diabetes mellitus with diabetic chronic kidney disease: Secondary | ICD-10-CM | POA: Diagnosis not present

## 2023-02-08 DIAGNOSIS — I5042 Chronic combined systolic (congestive) and diastolic (congestive) heart failure: Secondary | ICD-10-CM | POA: Diagnosis not present

## 2023-02-08 DIAGNOSIS — I110 Hypertensive heart disease with heart failure: Secondary | ICD-10-CM | POA: Diagnosis not present

## 2023-02-08 DIAGNOSIS — E1142 Type 2 diabetes mellitus with diabetic polyneuropathy: Secondary | ICD-10-CM | POA: Diagnosis not present

## 2023-02-08 DIAGNOSIS — I4892 Unspecified atrial flutter: Secondary | ICD-10-CM | POA: Diagnosis not present

## 2023-02-08 DIAGNOSIS — N184 Chronic kidney disease, stage 4 (severe): Secondary | ICD-10-CM | POA: Diagnosis not present

## 2023-02-08 DIAGNOSIS — I872 Venous insufficiency (chronic) (peripheral): Secondary | ICD-10-CM | POA: Diagnosis not present

## 2023-02-08 DIAGNOSIS — E782 Mixed hyperlipidemia: Secondary | ICD-10-CM | POA: Diagnosis not present

## 2023-02-08 DIAGNOSIS — I25119 Atherosclerotic heart disease of native coronary artery with unspecified angina pectoris: Secondary | ICD-10-CM | POA: Diagnosis not present

## 2023-02-09 DIAGNOSIS — N184 Chronic kidney disease, stage 4 (severe): Secondary | ICD-10-CM | POA: Diagnosis not present

## 2023-02-09 DIAGNOSIS — I5042 Chronic combined systolic (congestive) and diastolic (congestive) heart failure: Secondary | ICD-10-CM | POA: Diagnosis not present

## 2023-02-09 DIAGNOSIS — I4892 Unspecified atrial flutter: Secondary | ICD-10-CM | POA: Diagnosis not present

## 2023-02-09 DIAGNOSIS — I25119 Atherosclerotic heart disease of native coronary artery with unspecified angina pectoris: Secondary | ICD-10-CM | POA: Diagnosis not present

## 2023-02-09 DIAGNOSIS — E782 Mixed hyperlipidemia: Secondary | ICD-10-CM | POA: Diagnosis not present

## 2023-02-09 DIAGNOSIS — I110 Hypertensive heart disease with heart failure: Secondary | ICD-10-CM | POA: Diagnosis not present

## 2023-02-09 DIAGNOSIS — I872 Venous insufficiency (chronic) (peripheral): Secondary | ICD-10-CM | POA: Diagnosis not present

## 2023-02-09 DIAGNOSIS — E1122 Type 2 diabetes mellitus with diabetic chronic kidney disease: Secondary | ICD-10-CM | POA: Diagnosis not present

## 2023-02-09 DIAGNOSIS — E1142 Type 2 diabetes mellitus with diabetic polyneuropathy: Secondary | ICD-10-CM | POA: Diagnosis not present

## 2023-02-13 NOTE — Telephone Encounter (Signed)
Pt neighbor called with updated number for the patient. The pt new phone number is [phone].

## 2023-02-16 DIAGNOSIS — E1142 Type 2 diabetes mellitus with diabetic polyneuropathy: Secondary | ICD-10-CM | POA: Diagnosis not present

## 2023-02-16 DIAGNOSIS — I110 Hypertensive heart disease with heart failure: Secondary | ICD-10-CM | POA: Diagnosis not present

## 2023-02-16 DIAGNOSIS — N184 Chronic kidney disease, stage 4 (severe): Secondary | ICD-10-CM | POA: Diagnosis not present

## 2023-02-16 DIAGNOSIS — I5042 Chronic combined systolic (congestive) and diastolic (congestive) heart failure: Secondary | ICD-10-CM | POA: Diagnosis not present

## 2023-02-16 DIAGNOSIS — I872 Venous insufficiency (chronic) (peripheral): Secondary | ICD-10-CM | POA: Diagnosis not present

## 2023-02-16 DIAGNOSIS — E1122 Type 2 diabetes mellitus with diabetic chronic kidney disease: Secondary | ICD-10-CM | POA: Diagnosis not present

## 2023-02-16 DIAGNOSIS — I4892 Unspecified atrial flutter: Secondary | ICD-10-CM | POA: Diagnosis not present

## 2023-02-16 DIAGNOSIS — I25119 Atherosclerotic heart disease of native coronary artery with unspecified angina pectoris: Secondary | ICD-10-CM | POA: Diagnosis not present

## 2023-02-16 DIAGNOSIS — E782 Mixed hyperlipidemia: Secondary | ICD-10-CM | POA: Diagnosis not present

## 2023-02-16 NOTE — Telephone Encounter (Signed)
Outreach made to Pt.  He has not had a syncopal episodes.  Will continue to monitor.

## 2023-02-17 DIAGNOSIS — I4892 Unspecified atrial flutter: Secondary | ICD-10-CM | POA: Diagnosis not present

## 2023-02-17 DIAGNOSIS — I5042 Chronic combined systolic (congestive) and diastolic (congestive) heart failure: Secondary | ICD-10-CM | POA: Diagnosis not present

## 2023-02-17 DIAGNOSIS — E1142 Type 2 diabetes mellitus with diabetic polyneuropathy: Secondary | ICD-10-CM | POA: Diagnosis not present

## 2023-02-17 DIAGNOSIS — I25119 Atherosclerotic heart disease of native coronary artery with unspecified angina pectoris: Secondary | ICD-10-CM | POA: Diagnosis not present

## 2023-02-17 DIAGNOSIS — I110 Hypertensive heart disease with heart failure: Secondary | ICD-10-CM | POA: Diagnosis not present

## 2023-02-17 DIAGNOSIS — N184 Chronic kidney disease, stage 4 (severe): Secondary | ICD-10-CM | POA: Diagnosis not present

## 2023-02-17 DIAGNOSIS — E1122 Type 2 diabetes mellitus with diabetic chronic kidney disease: Secondary | ICD-10-CM | POA: Diagnosis not present

## 2023-02-17 DIAGNOSIS — E782 Mixed hyperlipidemia: Secondary | ICD-10-CM | POA: Diagnosis not present

## 2023-02-17 DIAGNOSIS — I872 Venous insufficiency (chronic) (peripheral): Secondary | ICD-10-CM | POA: Diagnosis not present

## 2023-02-18 DIAGNOSIS — I110 Hypertensive heart disease with heart failure: Secondary | ICD-10-CM | POA: Diagnosis not present

## 2023-02-18 DIAGNOSIS — E1122 Type 2 diabetes mellitus with diabetic chronic kidney disease: Secondary | ICD-10-CM | POA: Diagnosis not present

## 2023-02-18 DIAGNOSIS — I4892 Unspecified atrial flutter: Secondary | ICD-10-CM | POA: Diagnosis not present

## 2023-02-18 DIAGNOSIS — N184 Chronic kidney disease, stage 4 (severe): Secondary | ICD-10-CM | POA: Diagnosis not present

## 2023-02-18 DIAGNOSIS — I5042 Chronic combined systolic (congestive) and diastolic (congestive) heart failure: Secondary | ICD-10-CM | POA: Diagnosis not present

## 2023-02-18 DIAGNOSIS — I872 Venous insufficiency (chronic) (peripheral): Secondary | ICD-10-CM | POA: Diagnosis not present

## 2023-02-18 DIAGNOSIS — E782 Mixed hyperlipidemia: Secondary | ICD-10-CM | POA: Diagnosis not present

## 2023-02-18 DIAGNOSIS — I25119 Atherosclerotic heart disease of native coronary artery with unspecified angina pectoris: Secondary | ICD-10-CM | POA: Diagnosis not present

## 2023-02-18 DIAGNOSIS — E1142 Type 2 diabetes mellitus with diabetic polyneuropathy: Secondary | ICD-10-CM | POA: Diagnosis not present

## 2023-02-20 DIAGNOSIS — N184 Chronic kidney disease, stage 4 (severe): Secondary | ICD-10-CM | POA: Diagnosis not present

## 2023-02-20 DIAGNOSIS — I110 Hypertensive heart disease with heart failure: Secondary | ICD-10-CM | POA: Diagnosis not present

## 2023-02-20 DIAGNOSIS — I5042 Chronic combined systolic (congestive) and diastolic (congestive) heart failure: Secondary | ICD-10-CM | POA: Diagnosis not present

## 2023-02-20 DIAGNOSIS — E1142 Type 2 diabetes mellitus with diabetic polyneuropathy: Secondary | ICD-10-CM | POA: Diagnosis not present

## 2023-02-20 DIAGNOSIS — E782 Mixed hyperlipidemia: Secondary | ICD-10-CM | POA: Diagnosis not present

## 2023-02-20 DIAGNOSIS — I4892 Unspecified atrial flutter: Secondary | ICD-10-CM | POA: Diagnosis not present

## 2023-02-20 DIAGNOSIS — I872 Venous insufficiency (chronic) (peripheral): Secondary | ICD-10-CM | POA: Diagnosis not present

## 2023-02-20 DIAGNOSIS — E1122 Type 2 diabetes mellitus with diabetic chronic kidney disease: Secondary | ICD-10-CM | POA: Diagnosis not present

## 2023-02-20 DIAGNOSIS — I25119 Atherosclerotic heart disease of native coronary artery with unspecified angina pectoris: Secondary | ICD-10-CM | POA: Diagnosis not present

## 2023-02-23 DIAGNOSIS — E782 Mixed hyperlipidemia: Secondary | ICD-10-CM | POA: Diagnosis not present

## 2023-02-23 DIAGNOSIS — I25119 Atherosclerotic heart disease of native coronary artery with unspecified angina pectoris: Secondary | ICD-10-CM | POA: Diagnosis not present

## 2023-02-23 DIAGNOSIS — I872 Venous insufficiency (chronic) (peripheral): Secondary | ICD-10-CM | POA: Diagnosis not present

## 2023-02-23 DIAGNOSIS — N184 Chronic kidney disease, stage 4 (severe): Secondary | ICD-10-CM | POA: Diagnosis not present

## 2023-02-23 DIAGNOSIS — E1122 Type 2 diabetes mellitus with diabetic chronic kidney disease: Secondary | ICD-10-CM | POA: Diagnosis not present

## 2023-02-23 DIAGNOSIS — I5042 Chronic combined systolic (congestive) and diastolic (congestive) heart failure: Secondary | ICD-10-CM | POA: Diagnosis not present

## 2023-02-23 DIAGNOSIS — E1142 Type 2 diabetes mellitus with diabetic polyneuropathy: Secondary | ICD-10-CM | POA: Diagnosis not present

## 2023-02-23 DIAGNOSIS — I110 Hypertensive heart disease with heart failure: Secondary | ICD-10-CM | POA: Diagnosis not present

## 2023-02-23 DIAGNOSIS — I4892 Unspecified atrial flutter: Secondary | ICD-10-CM | POA: Diagnosis not present

## 2023-02-24 DIAGNOSIS — N184 Chronic kidney disease, stage 4 (severe): Secondary | ICD-10-CM | POA: Diagnosis not present

## 2023-02-24 DIAGNOSIS — E782 Mixed hyperlipidemia: Secondary | ICD-10-CM | POA: Diagnosis not present

## 2023-02-24 DIAGNOSIS — I25119 Atherosclerotic heart disease of native coronary artery with unspecified angina pectoris: Secondary | ICD-10-CM | POA: Diagnosis not present

## 2023-02-24 DIAGNOSIS — E1122 Type 2 diabetes mellitus with diabetic chronic kidney disease: Secondary | ICD-10-CM | POA: Diagnosis not present

## 2023-02-24 DIAGNOSIS — I4892 Unspecified atrial flutter: Secondary | ICD-10-CM | POA: Diagnosis not present

## 2023-02-24 DIAGNOSIS — E1142 Type 2 diabetes mellitus with diabetic polyneuropathy: Secondary | ICD-10-CM | POA: Diagnosis not present

## 2023-02-24 DIAGNOSIS — I110 Hypertensive heart disease with heart failure: Secondary | ICD-10-CM | POA: Diagnosis not present

## 2023-02-24 DIAGNOSIS — I872 Venous insufficiency (chronic) (peripheral): Secondary | ICD-10-CM | POA: Diagnosis not present

## 2023-02-24 DIAGNOSIS — I5042 Chronic combined systolic (congestive) and diastolic (congestive) heart failure: Secondary | ICD-10-CM | POA: Diagnosis not present

## 2023-02-25 DIAGNOSIS — I5042 Chronic combined systolic (congestive) and diastolic (congestive) heart failure: Secondary | ICD-10-CM | POA: Diagnosis not present

## 2023-02-25 DIAGNOSIS — E1122 Type 2 diabetes mellitus with diabetic chronic kidney disease: Secondary | ICD-10-CM | POA: Diagnosis not present

## 2023-02-25 DIAGNOSIS — I4892 Unspecified atrial flutter: Secondary | ICD-10-CM | POA: Diagnosis not present

## 2023-02-25 DIAGNOSIS — E782 Mixed hyperlipidemia: Secondary | ICD-10-CM | POA: Diagnosis not present

## 2023-02-25 DIAGNOSIS — I110 Hypertensive heart disease with heart failure: Secondary | ICD-10-CM | POA: Diagnosis not present

## 2023-02-25 DIAGNOSIS — I25119 Atherosclerotic heart disease of native coronary artery with unspecified angina pectoris: Secondary | ICD-10-CM | POA: Diagnosis not present

## 2023-02-25 DIAGNOSIS — N184 Chronic kidney disease, stage 4 (severe): Secondary | ICD-10-CM | POA: Diagnosis not present

## 2023-02-25 DIAGNOSIS — I872 Venous insufficiency (chronic) (peripheral): Secondary | ICD-10-CM | POA: Diagnosis not present

## 2023-02-25 DIAGNOSIS — E1142 Type 2 diabetes mellitus with diabetic polyneuropathy: Secondary | ICD-10-CM | POA: Diagnosis not present

## 2023-02-26 DIAGNOSIS — I4892 Unspecified atrial flutter: Secondary | ICD-10-CM | POA: Diagnosis not present

## 2023-02-26 DIAGNOSIS — E782 Mixed hyperlipidemia: Secondary | ICD-10-CM | POA: Diagnosis not present

## 2023-02-26 DIAGNOSIS — E1122 Type 2 diabetes mellitus with diabetic chronic kidney disease: Secondary | ICD-10-CM | POA: Diagnosis not present

## 2023-02-26 DIAGNOSIS — I5042 Chronic combined systolic (congestive) and diastolic (congestive) heart failure: Secondary | ICD-10-CM | POA: Diagnosis not present

## 2023-02-26 DIAGNOSIS — I872 Venous insufficiency (chronic) (peripheral): Secondary | ICD-10-CM | POA: Diagnosis not present

## 2023-02-26 DIAGNOSIS — I110 Hypertensive heart disease with heart failure: Secondary | ICD-10-CM | POA: Diagnosis not present

## 2023-02-26 DIAGNOSIS — N184 Chronic kidney disease, stage 4 (severe): Secondary | ICD-10-CM | POA: Diagnosis not present

## 2023-02-26 DIAGNOSIS — E1142 Type 2 diabetes mellitus with diabetic polyneuropathy: Secondary | ICD-10-CM | POA: Diagnosis not present

## 2023-02-26 DIAGNOSIS — I25119 Atherosclerotic heart disease of native coronary artery with unspecified angina pectoris: Secondary | ICD-10-CM | POA: Diagnosis not present

## 2023-02-27 DIAGNOSIS — E782 Mixed hyperlipidemia: Secondary | ICD-10-CM | POA: Diagnosis not present

## 2023-02-27 DIAGNOSIS — E1142 Type 2 diabetes mellitus with diabetic polyneuropathy: Secondary | ICD-10-CM | POA: Diagnosis not present

## 2023-02-27 DIAGNOSIS — I110 Hypertensive heart disease with heart failure: Secondary | ICD-10-CM | POA: Diagnosis not present

## 2023-02-27 DIAGNOSIS — I5042 Chronic combined systolic (congestive) and diastolic (congestive) heart failure: Secondary | ICD-10-CM | POA: Diagnosis not present

## 2023-02-27 DIAGNOSIS — I4892 Unspecified atrial flutter: Secondary | ICD-10-CM | POA: Diagnosis not present

## 2023-02-27 DIAGNOSIS — I25119 Atherosclerotic heart disease of native coronary artery with unspecified angina pectoris: Secondary | ICD-10-CM | POA: Diagnosis not present

## 2023-02-27 DIAGNOSIS — E1122 Type 2 diabetes mellitus with diabetic chronic kidney disease: Secondary | ICD-10-CM | POA: Diagnosis not present

## 2023-02-27 DIAGNOSIS — N184 Chronic kidney disease, stage 4 (severe): Secondary | ICD-10-CM | POA: Diagnosis not present

## 2023-02-27 DIAGNOSIS — I872 Venous insufficiency (chronic) (peripheral): Secondary | ICD-10-CM | POA: Diagnosis not present

## 2023-03-02 DIAGNOSIS — H6123 Impacted cerumen, bilateral: Secondary | ICD-10-CM | POA: Diagnosis not present

## 2023-03-02 DIAGNOSIS — H903 Sensorineural hearing loss, bilateral: Secondary | ICD-10-CM | POA: Diagnosis not present

## 2023-03-03 DIAGNOSIS — I5042 Chronic combined systolic (congestive) and diastolic (congestive) heart failure: Secondary | ICD-10-CM | POA: Diagnosis not present

## 2023-03-03 DIAGNOSIS — E782 Mixed hyperlipidemia: Secondary | ICD-10-CM | POA: Diagnosis not present

## 2023-03-03 DIAGNOSIS — I872 Venous insufficiency (chronic) (peripheral): Secondary | ICD-10-CM | POA: Diagnosis not present

## 2023-03-03 DIAGNOSIS — I110 Hypertensive heart disease with heart failure: Secondary | ICD-10-CM | POA: Diagnosis not present

## 2023-03-03 DIAGNOSIS — E1122 Type 2 diabetes mellitus with diabetic chronic kidney disease: Secondary | ICD-10-CM | POA: Diagnosis not present

## 2023-03-03 DIAGNOSIS — I4892 Unspecified atrial flutter: Secondary | ICD-10-CM | POA: Diagnosis not present

## 2023-03-03 DIAGNOSIS — I25119 Atherosclerotic heart disease of native coronary artery with unspecified angina pectoris: Secondary | ICD-10-CM | POA: Diagnosis not present

## 2023-03-03 DIAGNOSIS — N184 Chronic kidney disease, stage 4 (severe): Secondary | ICD-10-CM | POA: Diagnosis not present

## 2023-03-03 DIAGNOSIS — E1142 Type 2 diabetes mellitus with diabetic polyneuropathy: Secondary | ICD-10-CM | POA: Diagnosis not present

## 2023-03-05 DIAGNOSIS — E1122 Type 2 diabetes mellitus with diabetic chronic kidney disease: Secondary | ICD-10-CM | POA: Diagnosis not present

## 2023-03-05 DIAGNOSIS — E1142 Type 2 diabetes mellitus with diabetic polyneuropathy: Secondary | ICD-10-CM | POA: Diagnosis not present

## 2023-03-05 DIAGNOSIS — I25119 Atherosclerotic heart disease of native coronary artery with unspecified angina pectoris: Secondary | ICD-10-CM | POA: Diagnosis not present

## 2023-03-05 DIAGNOSIS — I4892 Unspecified atrial flutter: Secondary | ICD-10-CM | POA: Diagnosis not present

## 2023-03-05 DIAGNOSIS — I872 Venous insufficiency (chronic) (peripheral): Secondary | ICD-10-CM | POA: Diagnosis not present

## 2023-03-05 DIAGNOSIS — N184 Chronic kidney disease, stage 4 (severe): Secondary | ICD-10-CM | POA: Diagnosis not present

## 2023-03-05 DIAGNOSIS — I110 Hypertensive heart disease with heart failure: Secondary | ICD-10-CM | POA: Diagnosis not present

## 2023-03-05 DIAGNOSIS — E782 Mixed hyperlipidemia: Secondary | ICD-10-CM | POA: Diagnosis not present

## 2023-03-05 DIAGNOSIS — I5042 Chronic combined systolic (congestive) and diastolic (congestive) heart failure: Secondary | ICD-10-CM | POA: Diagnosis not present

## 2023-03-09 ENCOUNTER — Ambulatory Visit (INDEPENDENT_AMBULATORY_CARE_PROVIDER_SITE_OTHER): Payer: Medicare HMO

## 2023-03-09 DIAGNOSIS — I639 Cerebral infarction, unspecified: Secondary | ICD-10-CM | POA: Diagnosis not present

## 2023-03-09 DIAGNOSIS — E782 Mixed hyperlipidemia: Secondary | ICD-10-CM | POA: Diagnosis not present

## 2023-03-09 DIAGNOSIS — N184 Chronic kidney disease, stage 4 (severe): Secondary | ICD-10-CM | POA: Diagnosis not present

## 2023-03-09 DIAGNOSIS — I872 Venous insufficiency (chronic) (peripheral): Secondary | ICD-10-CM | POA: Diagnosis not present

## 2023-03-09 DIAGNOSIS — I110 Hypertensive heart disease with heart failure: Secondary | ICD-10-CM | POA: Diagnosis not present

## 2023-03-09 DIAGNOSIS — E1142 Type 2 diabetes mellitus with diabetic polyneuropathy: Secondary | ICD-10-CM | POA: Diagnosis not present

## 2023-03-09 DIAGNOSIS — I25119 Atherosclerotic heart disease of native coronary artery with unspecified angina pectoris: Secondary | ICD-10-CM | POA: Diagnosis not present

## 2023-03-09 DIAGNOSIS — I4892 Unspecified atrial flutter: Secondary | ICD-10-CM | POA: Diagnosis not present

## 2023-03-09 DIAGNOSIS — I5042 Chronic combined systolic (congestive) and diastolic (congestive) heart failure: Secondary | ICD-10-CM | POA: Diagnosis not present

## 2023-03-09 DIAGNOSIS — E1122 Type 2 diabetes mellitus with diabetic chronic kidney disease: Secondary | ICD-10-CM | POA: Diagnosis not present

## 2023-03-09 LAB — CUP PACEART REMOTE DEVICE CHECK
Date Time Interrogation Session: 20250119230032
Implantable Pulse Generator Implant Date: 20210310

## 2023-03-09 NOTE — Progress Notes (Signed)
Carelink Summary Report / Loop Recorder 

## 2023-03-10 DIAGNOSIS — N184 Chronic kidney disease, stage 4 (severe): Secondary | ICD-10-CM | POA: Diagnosis not present

## 2023-03-10 DIAGNOSIS — I872 Venous insufficiency (chronic) (peripheral): Secondary | ICD-10-CM | POA: Diagnosis not present

## 2023-03-10 DIAGNOSIS — I5042 Chronic combined systolic (congestive) and diastolic (congestive) heart failure: Secondary | ICD-10-CM | POA: Diagnosis not present

## 2023-03-10 DIAGNOSIS — I4892 Unspecified atrial flutter: Secondary | ICD-10-CM | POA: Diagnosis not present

## 2023-03-10 DIAGNOSIS — Z794 Long term (current) use of insulin: Secondary | ICD-10-CM | POA: Diagnosis not present

## 2023-03-10 DIAGNOSIS — E7800 Pure hypercholesterolemia, unspecified: Secondary | ICD-10-CM | POA: Diagnosis not present

## 2023-03-10 DIAGNOSIS — E1159 Type 2 diabetes mellitus with other circulatory complications: Secondary | ICD-10-CM | POA: Diagnosis not present

## 2023-03-10 DIAGNOSIS — I110 Hypertensive heart disease with heart failure: Secondary | ICD-10-CM | POA: Diagnosis not present

## 2023-03-10 DIAGNOSIS — I25119 Atherosclerotic heart disease of native coronary artery with unspecified angina pectoris: Secondary | ICD-10-CM | POA: Diagnosis not present

## 2023-03-10 DIAGNOSIS — E1122 Type 2 diabetes mellitus with diabetic chronic kidney disease: Secondary | ICD-10-CM | POA: Diagnosis not present

## 2023-03-10 DIAGNOSIS — E1142 Type 2 diabetes mellitus with diabetic polyneuropathy: Secondary | ICD-10-CM | POA: Diagnosis not present

## 2023-03-10 DIAGNOSIS — E782 Mixed hyperlipidemia: Secondary | ICD-10-CM | POA: Diagnosis not present

## 2023-03-10 DIAGNOSIS — Z6837 Body mass index (BMI) 37.0-37.9, adult: Secondary | ICD-10-CM | POA: Diagnosis not present

## 2023-03-12 DIAGNOSIS — I5042 Chronic combined systolic (congestive) and diastolic (congestive) heart failure: Secondary | ICD-10-CM | POA: Diagnosis not present

## 2023-03-12 DIAGNOSIS — E1122 Type 2 diabetes mellitus with diabetic chronic kidney disease: Secondary | ICD-10-CM | POA: Diagnosis not present

## 2023-03-12 DIAGNOSIS — E1142 Type 2 diabetes mellitus with diabetic polyneuropathy: Secondary | ICD-10-CM | POA: Diagnosis not present

## 2023-03-12 DIAGNOSIS — I110 Hypertensive heart disease with heart failure: Secondary | ICD-10-CM | POA: Diagnosis not present

## 2023-03-12 DIAGNOSIS — E782 Mixed hyperlipidemia: Secondary | ICD-10-CM | POA: Diagnosis not present

## 2023-03-12 DIAGNOSIS — I25119 Atherosclerotic heart disease of native coronary artery with unspecified angina pectoris: Secondary | ICD-10-CM | POA: Diagnosis not present

## 2023-03-12 DIAGNOSIS — N184 Chronic kidney disease, stage 4 (severe): Secondary | ICD-10-CM | POA: Diagnosis not present

## 2023-03-12 DIAGNOSIS — I4892 Unspecified atrial flutter: Secondary | ICD-10-CM | POA: Diagnosis not present

## 2023-03-12 DIAGNOSIS — I872 Venous insufficiency (chronic) (peripheral): Secondary | ICD-10-CM | POA: Diagnosis not present

## 2023-03-17 DIAGNOSIS — I4892 Unspecified atrial flutter: Secondary | ICD-10-CM | POA: Diagnosis not present

## 2023-03-17 DIAGNOSIS — N184 Chronic kidney disease, stage 4 (severe): Secondary | ICD-10-CM | POA: Diagnosis not present

## 2023-03-17 DIAGNOSIS — E1142 Type 2 diabetes mellitus with diabetic polyneuropathy: Secondary | ICD-10-CM | POA: Diagnosis not present

## 2023-03-17 DIAGNOSIS — I110 Hypertensive heart disease with heart failure: Secondary | ICD-10-CM | POA: Diagnosis not present

## 2023-03-17 DIAGNOSIS — I5042 Chronic combined systolic (congestive) and diastolic (congestive) heart failure: Secondary | ICD-10-CM | POA: Diagnosis not present

## 2023-03-17 DIAGNOSIS — I872 Venous insufficiency (chronic) (peripheral): Secondary | ICD-10-CM | POA: Diagnosis not present

## 2023-03-17 DIAGNOSIS — E1122 Type 2 diabetes mellitus with diabetic chronic kidney disease: Secondary | ICD-10-CM | POA: Diagnosis not present

## 2023-03-17 DIAGNOSIS — I25119 Atherosclerotic heart disease of native coronary artery with unspecified angina pectoris: Secondary | ICD-10-CM | POA: Diagnosis not present

## 2023-03-17 DIAGNOSIS — E782 Mixed hyperlipidemia: Secondary | ICD-10-CM | POA: Diagnosis not present

## 2023-03-18 DIAGNOSIS — I110 Hypertensive heart disease with heart failure: Secondary | ICD-10-CM | POA: Diagnosis not present

## 2023-03-18 DIAGNOSIS — I872 Venous insufficiency (chronic) (peripheral): Secondary | ICD-10-CM | POA: Diagnosis not present

## 2023-03-18 DIAGNOSIS — E1122 Type 2 diabetes mellitus with diabetic chronic kidney disease: Secondary | ICD-10-CM | POA: Diagnosis not present

## 2023-03-18 DIAGNOSIS — I4892 Unspecified atrial flutter: Secondary | ICD-10-CM | POA: Diagnosis not present

## 2023-03-18 DIAGNOSIS — E1142 Type 2 diabetes mellitus with diabetic polyneuropathy: Secondary | ICD-10-CM | POA: Diagnosis not present

## 2023-03-18 DIAGNOSIS — I25119 Atherosclerotic heart disease of native coronary artery with unspecified angina pectoris: Secondary | ICD-10-CM | POA: Diagnosis not present

## 2023-03-18 DIAGNOSIS — I5042 Chronic combined systolic (congestive) and diastolic (congestive) heart failure: Secondary | ICD-10-CM | POA: Diagnosis not present

## 2023-03-18 DIAGNOSIS — N184 Chronic kidney disease, stage 4 (severe): Secondary | ICD-10-CM | POA: Diagnosis not present

## 2023-03-18 DIAGNOSIS — E782 Mixed hyperlipidemia: Secondary | ICD-10-CM | POA: Diagnosis not present

## 2023-03-21 DIAGNOSIS — I5042 Chronic combined systolic (congestive) and diastolic (congestive) heart failure: Secondary | ICD-10-CM | POA: Diagnosis not present

## 2023-03-21 DIAGNOSIS — I4892 Unspecified atrial flutter: Secondary | ICD-10-CM | POA: Diagnosis not present

## 2023-03-21 DIAGNOSIS — N184 Chronic kidney disease, stage 4 (severe): Secondary | ICD-10-CM | POA: Diagnosis not present

## 2023-03-21 DIAGNOSIS — I872 Venous insufficiency (chronic) (peripheral): Secondary | ICD-10-CM | POA: Diagnosis not present

## 2023-03-21 DIAGNOSIS — E1122 Type 2 diabetes mellitus with diabetic chronic kidney disease: Secondary | ICD-10-CM | POA: Diagnosis not present

## 2023-03-21 DIAGNOSIS — E1142 Type 2 diabetes mellitus with diabetic polyneuropathy: Secondary | ICD-10-CM | POA: Diagnosis not present

## 2023-03-21 DIAGNOSIS — E782 Mixed hyperlipidemia: Secondary | ICD-10-CM | POA: Diagnosis not present

## 2023-03-21 DIAGNOSIS — I110 Hypertensive heart disease with heart failure: Secondary | ICD-10-CM | POA: Diagnosis not present

## 2023-03-21 DIAGNOSIS — I25119 Atherosclerotic heart disease of native coronary artery with unspecified angina pectoris: Secondary | ICD-10-CM | POA: Diagnosis not present

## 2023-03-26 DIAGNOSIS — I872 Venous insufficiency (chronic) (peripheral): Secondary | ICD-10-CM | POA: Diagnosis not present

## 2023-03-26 DIAGNOSIS — E782 Mixed hyperlipidemia: Secondary | ICD-10-CM | POA: Diagnosis not present

## 2023-03-26 DIAGNOSIS — E1122 Type 2 diabetes mellitus with diabetic chronic kidney disease: Secondary | ICD-10-CM | POA: Diagnosis not present

## 2023-03-26 DIAGNOSIS — I5042 Chronic combined systolic (congestive) and diastolic (congestive) heart failure: Secondary | ICD-10-CM | POA: Diagnosis not present

## 2023-03-26 DIAGNOSIS — I110 Hypertensive heart disease with heart failure: Secondary | ICD-10-CM | POA: Diagnosis not present

## 2023-03-26 DIAGNOSIS — N184 Chronic kidney disease, stage 4 (severe): Secondary | ICD-10-CM | POA: Diagnosis not present

## 2023-03-26 DIAGNOSIS — I25119 Atherosclerotic heart disease of native coronary artery with unspecified angina pectoris: Secondary | ICD-10-CM | POA: Diagnosis not present

## 2023-03-26 DIAGNOSIS — E1142 Type 2 diabetes mellitus with diabetic polyneuropathy: Secondary | ICD-10-CM | POA: Diagnosis not present

## 2023-03-26 DIAGNOSIS — I4892 Unspecified atrial flutter: Secondary | ICD-10-CM | POA: Diagnosis not present

## 2023-03-30 DIAGNOSIS — E782 Mixed hyperlipidemia: Secondary | ICD-10-CM | POA: Diagnosis not present

## 2023-03-30 DIAGNOSIS — I872 Venous insufficiency (chronic) (peripheral): Secondary | ICD-10-CM | POA: Diagnosis not present

## 2023-03-30 DIAGNOSIS — N184 Chronic kidney disease, stage 4 (severe): Secondary | ICD-10-CM | POA: Diagnosis not present

## 2023-03-30 DIAGNOSIS — I5042 Chronic combined systolic (congestive) and diastolic (congestive) heart failure: Secondary | ICD-10-CM | POA: Diagnosis not present

## 2023-03-30 DIAGNOSIS — I4892 Unspecified atrial flutter: Secondary | ICD-10-CM | POA: Diagnosis not present

## 2023-03-30 DIAGNOSIS — I110 Hypertensive heart disease with heart failure: Secondary | ICD-10-CM | POA: Diagnosis not present

## 2023-03-30 DIAGNOSIS — E1142 Type 2 diabetes mellitus with diabetic polyneuropathy: Secondary | ICD-10-CM | POA: Diagnosis not present

## 2023-03-30 DIAGNOSIS — I25119 Atherosclerotic heart disease of native coronary artery with unspecified angina pectoris: Secondary | ICD-10-CM | POA: Diagnosis not present

## 2023-03-30 DIAGNOSIS — E1122 Type 2 diabetes mellitus with diabetic chronic kidney disease: Secondary | ICD-10-CM | POA: Diagnosis not present

## 2023-03-31 DIAGNOSIS — E1122 Type 2 diabetes mellitus with diabetic chronic kidney disease: Secondary | ICD-10-CM | POA: Diagnosis not present

## 2023-03-31 DIAGNOSIS — I4892 Unspecified atrial flutter: Secondary | ICD-10-CM | POA: Diagnosis not present

## 2023-03-31 DIAGNOSIS — I872 Venous insufficiency (chronic) (peripheral): Secondary | ICD-10-CM | POA: Diagnosis not present

## 2023-03-31 DIAGNOSIS — E1142 Type 2 diabetes mellitus with diabetic polyneuropathy: Secondary | ICD-10-CM | POA: Diagnosis not present

## 2023-03-31 DIAGNOSIS — N184 Chronic kidney disease, stage 4 (severe): Secondary | ICD-10-CM | POA: Diagnosis not present

## 2023-03-31 DIAGNOSIS — I5042 Chronic combined systolic (congestive) and diastolic (congestive) heart failure: Secondary | ICD-10-CM | POA: Diagnosis not present

## 2023-03-31 DIAGNOSIS — I110 Hypertensive heart disease with heart failure: Secondary | ICD-10-CM | POA: Diagnosis not present

## 2023-03-31 DIAGNOSIS — E782 Mixed hyperlipidemia: Secondary | ICD-10-CM | POA: Diagnosis not present

## 2023-03-31 DIAGNOSIS — I25119 Atherosclerotic heart disease of native coronary artery with unspecified angina pectoris: Secondary | ICD-10-CM | POA: Diagnosis not present

## 2023-04-08 DIAGNOSIS — I5042 Chronic combined systolic (congestive) and diastolic (congestive) heart failure: Secondary | ICD-10-CM | POA: Diagnosis not present

## 2023-04-08 DIAGNOSIS — I110 Hypertensive heart disease with heart failure: Secondary | ICD-10-CM | POA: Diagnosis not present

## 2023-04-08 DIAGNOSIS — E1122 Type 2 diabetes mellitus with diabetic chronic kidney disease: Secondary | ICD-10-CM | POA: Diagnosis not present

## 2023-04-08 DIAGNOSIS — I25119 Atherosclerotic heart disease of native coronary artery with unspecified angina pectoris: Secondary | ICD-10-CM | POA: Diagnosis not present

## 2023-04-08 DIAGNOSIS — N184 Chronic kidney disease, stage 4 (severe): Secondary | ICD-10-CM | POA: Diagnosis not present

## 2023-04-08 DIAGNOSIS — I4892 Unspecified atrial flutter: Secondary | ICD-10-CM | POA: Diagnosis not present

## 2023-04-08 DIAGNOSIS — E782 Mixed hyperlipidemia: Secondary | ICD-10-CM | POA: Diagnosis not present

## 2023-04-08 DIAGNOSIS — I872 Venous insufficiency (chronic) (peripheral): Secondary | ICD-10-CM | POA: Diagnosis not present

## 2023-04-08 DIAGNOSIS — E1142 Type 2 diabetes mellitus with diabetic polyneuropathy: Secondary | ICD-10-CM | POA: Diagnosis not present

## 2023-04-13 ENCOUNTER — Ambulatory Visit (INDEPENDENT_AMBULATORY_CARE_PROVIDER_SITE_OTHER): Payer: Medicare HMO

## 2023-04-13 DIAGNOSIS — I639 Cerebral infarction, unspecified: Secondary | ICD-10-CM | POA: Diagnosis not present

## 2023-04-14 LAB — CUP PACEART REMOTE DEVICE CHECK
Date Time Interrogation Session: 20250223230050
Implantable Pulse Generator Implant Date: 20210310

## 2023-04-15 NOTE — Progress Notes (Signed)
 Carelink Summary Report / Loop Recorder

## 2023-05-18 ENCOUNTER — Ambulatory Visit (INDEPENDENT_AMBULATORY_CARE_PROVIDER_SITE_OTHER): Payer: Medicare HMO

## 2023-05-18 DIAGNOSIS — I25119 Atherosclerotic heart disease of native coronary artery with unspecified angina pectoris: Secondary | ICD-10-CM | POA: Diagnosis not present

## 2023-05-18 DIAGNOSIS — I639 Cerebral infarction, unspecified: Secondary | ICD-10-CM | POA: Diagnosis not present

## 2023-05-18 DIAGNOSIS — N184 Chronic kidney disease, stage 4 (severe): Secondary | ICD-10-CM | POA: Diagnosis not present

## 2023-05-18 DIAGNOSIS — I10 Essential (primary) hypertension: Secondary | ICD-10-CM | POA: Diagnosis not present

## 2023-05-18 DIAGNOSIS — E1159 Type 2 diabetes mellitus with other circulatory complications: Secondary | ICD-10-CM | POA: Diagnosis not present

## 2023-05-19 LAB — CUP PACEART REMOTE DEVICE CHECK
Date Time Interrogation Session: 20250330230150
Implantable Pulse Generator Implant Date: 20210310

## 2023-05-19 NOTE — Progress Notes (Signed)
 Carelink Summary Report / Loop Recorder

## 2023-05-19 NOTE — Addendum Note (Signed)
 Addended by: Geralyn Flash D on: 05/19/2023 12:09 PM   Modules accepted: Orders

## 2023-06-01 DIAGNOSIS — Z794 Long term (current) use of insulin: Secondary | ICD-10-CM | POA: Diagnosis not present

## 2023-06-01 DIAGNOSIS — S41112A Laceration without foreign body of left upper arm, initial encounter: Secondary | ICD-10-CM | POA: Diagnosis not present

## 2023-06-01 DIAGNOSIS — E1165 Type 2 diabetes mellitus with hyperglycemia: Secondary | ICD-10-CM | POA: Diagnosis not present

## 2023-06-01 DIAGNOSIS — W19XXXA Unspecified fall, initial encounter: Secondary | ICD-10-CM | POA: Diagnosis not present

## 2023-06-01 DIAGNOSIS — R609 Edema, unspecified: Secondary | ICD-10-CM | POA: Diagnosis not present

## 2023-06-01 DIAGNOSIS — S50812A Abrasion of left forearm, initial encounter: Secondary | ICD-10-CM | POA: Diagnosis not present

## 2023-06-01 DIAGNOSIS — Z7985 Long-term (current) use of injectable non-insulin antidiabetic drugs: Secondary | ICD-10-CM | POA: Diagnosis not present

## 2023-06-01 DIAGNOSIS — Z23 Encounter for immunization: Secondary | ICD-10-CM | POA: Diagnosis not present

## 2023-06-01 DIAGNOSIS — Z7984 Long term (current) use of oral hypoglycemic drugs: Secondary | ICD-10-CM | POA: Diagnosis not present

## 2023-06-01 DIAGNOSIS — W1830XA Fall on same level, unspecified, initial encounter: Secondary | ICD-10-CM | POA: Diagnosis not present

## 2023-06-01 DIAGNOSIS — S51812A Laceration without foreign body of left forearm, initial encounter: Secondary | ICD-10-CM | POA: Diagnosis not present

## 2023-06-01 DIAGNOSIS — I10 Essential (primary) hypertension: Secondary | ICD-10-CM | POA: Diagnosis not present

## 2023-06-01 DIAGNOSIS — R739 Hyperglycemia, unspecified: Secondary | ICD-10-CM | POA: Diagnosis not present

## 2023-06-08 DIAGNOSIS — E1159 Type 2 diabetes mellitus with other circulatory complications: Secondary | ICD-10-CM | POA: Diagnosis not present

## 2023-06-08 DIAGNOSIS — Z794 Long term (current) use of insulin: Secondary | ICD-10-CM | POA: Diagnosis not present

## 2023-06-08 DIAGNOSIS — E782 Mixed hyperlipidemia: Secondary | ICD-10-CM | POA: Diagnosis not present

## 2023-06-08 DIAGNOSIS — E1142 Type 2 diabetes mellitus with diabetic polyneuropathy: Secondary | ICD-10-CM | POA: Diagnosis not present

## 2023-06-08 DIAGNOSIS — I110 Hypertensive heart disease with heart failure: Secondary | ICD-10-CM | POA: Diagnosis not present

## 2023-06-08 DIAGNOSIS — I10 Essential (primary) hypertension: Secondary | ICD-10-CM | POA: Diagnosis not present

## 2023-06-08 DIAGNOSIS — I5022 Chronic systolic (congestive) heart failure: Secondary | ICD-10-CM | POA: Diagnosis not present

## 2023-06-08 DIAGNOSIS — E113293 Type 2 diabetes mellitus with mild nonproliferative diabetic retinopathy without macular edema, bilateral: Secondary | ICD-10-CM | POA: Diagnosis not present

## 2023-06-08 DIAGNOSIS — E1122 Type 2 diabetes mellitus with diabetic chronic kidney disease: Secondary | ICD-10-CM | POA: Diagnosis not present

## 2023-06-08 DIAGNOSIS — I872 Venous insufficiency (chronic) (peripheral): Secondary | ICD-10-CM | POA: Diagnosis not present

## 2023-06-17 DIAGNOSIS — I25119 Atherosclerotic heart disease of native coronary artery with unspecified angina pectoris: Secondary | ICD-10-CM | POA: Diagnosis not present

## 2023-06-17 DIAGNOSIS — N184 Chronic kidney disease, stage 4 (severe): Secondary | ICD-10-CM | POA: Diagnosis not present

## 2023-06-17 DIAGNOSIS — I10 Essential (primary) hypertension: Secondary | ICD-10-CM | POA: Diagnosis not present

## 2023-06-17 DIAGNOSIS — E1159 Type 2 diabetes mellitus with other circulatory complications: Secondary | ICD-10-CM | POA: Diagnosis not present

## 2023-06-22 ENCOUNTER — Ambulatory Visit: Payer: Medicare HMO

## 2023-06-22 DIAGNOSIS — I2510 Atherosclerotic heart disease of native coronary artery without angina pectoris: Secondary | ICD-10-CM | POA: Diagnosis not present

## 2023-06-22 DIAGNOSIS — N184 Chronic kidney disease, stage 4 (severe): Secondary | ICD-10-CM | POA: Diagnosis not present

## 2023-06-22 DIAGNOSIS — M150 Primary generalized (osteo)arthritis: Secondary | ICD-10-CM | POA: Diagnosis not present

## 2023-06-22 DIAGNOSIS — I130 Hypertensive heart and chronic kidney disease with heart failure and stage 1 through stage 4 chronic kidney disease, or unspecified chronic kidney disease: Secondary | ICD-10-CM | POA: Diagnosis not present

## 2023-06-22 DIAGNOSIS — E1122 Type 2 diabetes mellitus with diabetic chronic kidney disease: Secondary | ICD-10-CM | POA: Diagnosis not present

## 2023-06-22 DIAGNOSIS — I5042 Chronic combined systolic (congestive) and diastolic (congestive) heart failure: Secondary | ICD-10-CM | POA: Diagnosis not present

## 2023-06-22 DIAGNOSIS — E1142 Type 2 diabetes mellitus with diabetic polyneuropathy: Secondary | ICD-10-CM | POA: Diagnosis not present

## 2023-06-22 DIAGNOSIS — I4892 Unspecified atrial flutter: Secondary | ICD-10-CM | POA: Diagnosis not present

## 2023-06-22 DIAGNOSIS — E113293 Type 2 diabetes mellitus with mild nonproliferative diabetic retinopathy without macular edema, bilateral: Secondary | ICD-10-CM | POA: Diagnosis not present

## 2023-06-22 DIAGNOSIS — I639 Cerebral infarction, unspecified: Secondary | ICD-10-CM | POA: Diagnosis not present

## 2023-06-22 LAB — CUP PACEART REMOTE DEVICE CHECK
Date Time Interrogation Session: 20250504230302
Implantable Pulse Generator Implant Date: 20210310

## 2023-06-23 DIAGNOSIS — E1142 Type 2 diabetes mellitus with diabetic polyneuropathy: Secondary | ICD-10-CM | POA: Diagnosis not present

## 2023-06-23 DIAGNOSIS — I5042 Chronic combined systolic (congestive) and diastolic (congestive) heart failure: Secondary | ICD-10-CM | POA: Diagnosis not present

## 2023-06-23 DIAGNOSIS — E113293 Type 2 diabetes mellitus with mild nonproliferative diabetic retinopathy without macular edema, bilateral: Secondary | ICD-10-CM | POA: Diagnosis not present

## 2023-06-23 DIAGNOSIS — I130 Hypertensive heart and chronic kidney disease with heart failure and stage 1 through stage 4 chronic kidney disease, or unspecified chronic kidney disease: Secondary | ICD-10-CM | POA: Diagnosis not present

## 2023-06-23 DIAGNOSIS — M150 Primary generalized (osteo)arthritis: Secondary | ICD-10-CM | POA: Diagnosis not present

## 2023-06-23 DIAGNOSIS — E1122 Type 2 diabetes mellitus with diabetic chronic kidney disease: Secondary | ICD-10-CM | POA: Diagnosis not present

## 2023-06-23 DIAGNOSIS — I4892 Unspecified atrial flutter: Secondary | ICD-10-CM | POA: Diagnosis not present

## 2023-06-23 DIAGNOSIS — I2510 Atherosclerotic heart disease of native coronary artery without angina pectoris: Secondary | ICD-10-CM | POA: Diagnosis not present

## 2023-06-23 DIAGNOSIS — N184 Chronic kidney disease, stage 4 (severe): Secondary | ICD-10-CM | POA: Diagnosis not present

## 2023-06-24 DIAGNOSIS — N179 Acute kidney failure, unspecified: Secondary | ICD-10-CM | POA: Diagnosis not present

## 2023-06-24 DIAGNOSIS — R7402 Elevation of levels of lactic acid dehydrogenase (LDH): Secondary | ICD-10-CM | POA: Diagnosis not present

## 2023-06-24 DIAGNOSIS — R627 Adult failure to thrive: Secondary | ICD-10-CM | POA: Diagnosis not present

## 2023-06-24 DIAGNOSIS — Z7982 Long term (current) use of aspirin: Secondary | ICD-10-CM | POA: Diagnosis not present

## 2023-06-24 DIAGNOSIS — E66812 Obesity, class 2: Secondary | ICD-10-CM | POA: Diagnosis not present

## 2023-06-24 DIAGNOSIS — R000 Tachycardia, unspecified: Secondary | ICD-10-CM | POA: Diagnosis not present

## 2023-06-24 DIAGNOSIS — J90 Pleural effusion, not elsewhere classified: Secondary | ICD-10-CM | POA: Diagnosis not present

## 2023-06-24 DIAGNOSIS — R531 Weakness: Secondary | ICD-10-CM | POA: Diagnosis not present

## 2023-06-24 DIAGNOSIS — Z1152 Encounter for screening for COVID-19: Secondary | ICD-10-CM | POA: Diagnosis not present

## 2023-06-24 DIAGNOSIS — I272 Pulmonary hypertension, unspecified: Secondary | ICD-10-CM | POA: Diagnosis not present

## 2023-06-24 DIAGNOSIS — N390 Urinary tract infection, site not specified: Secondary | ICD-10-CM | POA: Diagnosis not present

## 2023-06-24 DIAGNOSIS — I130 Hypertensive heart and chronic kidney disease with heart failure and stage 1 through stage 4 chronic kidney disease, or unspecified chronic kidney disease: Secondary | ICD-10-CM | POA: Diagnosis not present

## 2023-06-24 DIAGNOSIS — E1122 Type 2 diabetes mellitus with diabetic chronic kidney disease: Secondary | ICD-10-CM | POA: Diagnosis not present

## 2023-06-24 DIAGNOSIS — I21A1 Myocardial infarction type 2: Secondary | ICD-10-CM | POA: Diagnosis not present

## 2023-06-24 DIAGNOSIS — R4182 Altered mental status, unspecified: Secondary | ICD-10-CM | POA: Diagnosis not present

## 2023-06-24 DIAGNOSIS — E785 Hyperlipidemia, unspecified: Secondary | ICD-10-CM | POA: Diagnosis not present

## 2023-06-24 DIAGNOSIS — I5022 Chronic systolic (congestive) heart failure: Secondary | ICD-10-CM | POA: Diagnosis not present

## 2023-06-24 DIAGNOSIS — K8689 Other specified diseases of pancreas: Secondary | ICD-10-CM | POA: Diagnosis not present

## 2023-06-24 DIAGNOSIS — M6282 Rhabdomyolysis: Secondary | ICD-10-CM | POA: Diagnosis not present

## 2023-06-24 DIAGNOSIS — N401 Enlarged prostate with lower urinary tract symptoms: Secondary | ICD-10-CM | POA: Diagnosis not present

## 2023-06-25 DIAGNOSIS — N390 Urinary tract infection, site not specified: Secondary | ICD-10-CM | POA: Diagnosis not present

## 2023-06-25 DIAGNOSIS — J90 Pleural effusion, not elsewhere classified: Secondary | ICD-10-CM | POA: Diagnosis not present

## 2023-06-25 DIAGNOSIS — R7989 Other specified abnormal findings of blood chemistry: Secondary | ICD-10-CM | POA: Diagnosis not present

## 2023-06-25 DIAGNOSIS — R627 Adult failure to thrive: Secondary | ICD-10-CM | POA: Diagnosis not present

## 2023-06-25 DIAGNOSIS — I2510 Atherosclerotic heart disease of native coronary artery without angina pectoris: Secondary | ICD-10-CM | POA: Diagnosis not present

## 2023-06-25 DIAGNOSIS — N179 Acute kidney failure, unspecified: Secondary | ICD-10-CM | POA: Diagnosis not present

## 2023-06-25 DIAGNOSIS — I21A1 Myocardial infarction type 2: Secondary | ICD-10-CM | POA: Diagnosis not present

## 2023-06-25 DIAGNOSIS — I130 Hypertensive heart and chronic kidney disease with heart failure and stage 1 through stage 4 chronic kidney disease, or unspecified chronic kidney disease: Secondary | ICD-10-CM | POA: Diagnosis not present

## 2023-06-25 DIAGNOSIS — M6282 Rhabdomyolysis: Secondary | ICD-10-CM | POA: Diagnosis not present

## 2023-06-25 DIAGNOSIS — Z1152 Encounter for screening for COVID-19: Secondary | ICD-10-CM | POA: Diagnosis not present

## 2023-06-25 DIAGNOSIS — N1831 Chronic kidney disease, stage 3a: Secondary | ICD-10-CM | POA: Diagnosis not present

## 2023-06-25 DIAGNOSIS — I5022 Chronic systolic (congestive) heart failure: Secondary | ICD-10-CM | POA: Diagnosis not present

## 2023-06-26 DIAGNOSIS — N1831 Chronic kidney disease, stage 3a: Secondary | ICD-10-CM | POA: Diagnosis not present

## 2023-06-26 DIAGNOSIS — R627 Adult failure to thrive: Secondary | ICD-10-CM | POA: Diagnosis not present

## 2023-06-26 DIAGNOSIS — R7989 Other specified abnormal findings of blood chemistry: Secondary | ICD-10-CM | POA: Diagnosis not present

## 2023-06-26 DIAGNOSIS — N179 Acute kidney failure, unspecified: Secondary | ICD-10-CM | POA: Diagnosis not present

## 2023-06-26 DIAGNOSIS — I080 Rheumatic disorders of both mitral and aortic valves: Secondary | ICD-10-CM | POA: Diagnosis not present

## 2023-06-26 DIAGNOSIS — I2510 Atherosclerotic heart disease of native coronary artery without angina pectoris: Secondary | ICD-10-CM | POA: Diagnosis not present

## 2023-06-26 DIAGNOSIS — I5022 Chronic systolic (congestive) heart failure: Secondary | ICD-10-CM | POA: Diagnosis not present

## 2023-06-27 DIAGNOSIS — K6389 Other specified diseases of intestine: Secondary | ICD-10-CM | POA: Diagnosis not present

## 2023-06-27 DIAGNOSIS — R140 Abdominal distension (gaseous): Secondary | ICD-10-CM | POA: Diagnosis not present

## 2023-06-27 DIAGNOSIS — R627 Adult failure to thrive: Secondary | ICD-10-CM | POA: Diagnosis not present

## 2023-06-28 DIAGNOSIS — R627 Adult failure to thrive: Secondary | ICD-10-CM | POA: Diagnosis not present

## 2023-06-29 DIAGNOSIS — R531 Weakness: Secondary | ICD-10-CM | POA: Diagnosis not present

## 2023-06-29 DIAGNOSIS — Z7401 Bed confinement status: Secondary | ICD-10-CM | POA: Diagnosis not present

## 2023-06-29 DIAGNOSIS — R627 Adult failure to thrive: Secondary | ICD-10-CM | POA: Diagnosis not present

## 2023-07-01 DIAGNOSIS — E113293 Type 2 diabetes mellitus with mild nonproliferative diabetic retinopathy without macular edema, bilateral: Secondary | ICD-10-CM | POA: Diagnosis not present

## 2023-07-01 DIAGNOSIS — N184 Chronic kidney disease, stage 4 (severe): Secondary | ICD-10-CM | POA: Diagnosis not present

## 2023-07-01 DIAGNOSIS — I5042 Chronic combined systolic (congestive) and diastolic (congestive) heart failure: Secondary | ICD-10-CM | POA: Diagnosis not present

## 2023-07-01 DIAGNOSIS — I4892 Unspecified atrial flutter: Secondary | ICD-10-CM | POA: Diagnosis not present

## 2023-07-01 DIAGNOSIS — I2510 Atherosclerotic heart disease of native coronary artery without angina pectoris: Secondary | ICD-10-CM | POA: Diagnosis not present

## 2023-07-01 DIAGNOSIS — E1142 Type 2 diabetes mellitus with diabetic polyneuropathy: Secondary | ICD-10-CM | POA: Diagnosis not present

## 2023-07-01 DIAGNOSIS — M150 Primary generalized (osteo)arthritis: Secondary | ICD-10-CM | POA: Diagnosis not present

## 2023-07-01 DIAGNOSIS — I130 Hypertensive heart and chronic kidney disease with heart failure and stage 1 through stage 4 chronic kidney disease, or unspecified chronic kidney disease: Secondary | ICD-10-CM | POA: Diagnosis not present

## 2023-07-01 DIAGNOSIS — E1122 Type 2 diabetes mellitus with diabetic chronic kidney disease: Secondary | ICD-10-CM | POA: Diagnosis not present

## 2023-07-01 NOTE — Addendum Note (Signed)
 Addended by: Edra Govern D on: 07/01/2023 10:10 AM   Modules accepted: Orders

## 2023-07-01 NOTE — Progress Notes (Signed)
 Carelink Summary Report / Loop Recorder

## 2023-07-02 DIAGNOSIS — Z794 Long term (current) use of insulin: Secondary | ICD-10-CM | POA: Diagnosis not present

## 2023-07-02 DIAGNOSIS — E1122 Type 2 diabetes mellitus with diabetic chronic kidney disease: Secondary | ICD-10-CM | POA: Diagnosis not present

## 2023-07-02 DIAGNOSIS — I2510 Atherosclerotic heart disease of native coronary artery without angina pectoris: Secondary | ICD-10-CM | POA: Diagnosis not present

## 2023-07-02 DIAGNOSIS — M150 Primary generalized (osteo)arthritis: Secondary | ICD-10-CM | POA: Diagnosis not present

## 2023-07-02 DIAGNOSIS — N184 Chronic kidney disease, stage 4 (severe): Secondary | ICD-10-CM | POA: Diagnosis not present

## 2023-07-02 DIAGNOSIS — I5042 Chronic combined systolic (congestive) and diastolic (congestive) heart failure: Secondary | ICD-10-CM | POA: Diagnosis not present

## 2023-07-02 DIAGNOSIS — I252 Old myocardial infarction: Secondary | ICD-10-CM | POA: Diagnosis not present

## 2023-07-02 DIAGNOSIS — E113293 Type 2 diabetes mellitus with mild nonproliferative diabetic retinopathy without macular edema, bilateral: Secondary | ICD-10-CM | POA: Diagnosis not present

## 2023-07-02 DIAGNOSIS — E1159 Type 2 diabetes mellitus with other circulatory complications: Secondary | ICD-10-CM | POA: Diagnosis not present

## 2023-07-02 DIAGNOSIS — E1142 Type 2 diabetes mellitus with diabetic polyneuropathy: Secondary | ICD-10-CM | POA: Diagnosis not present

## 2023-07-02 DIAGNOSIS — I4892 Unspecified atrial flutter: Secondary | ICD-10-CM | POA: Diagnosis not present

## 2023-07-02 DIAGNOSIS — I130 Hypertensive heart and chronic kidney disease with heart failure and stage 1 through stage 4 chronic kidney disease, or unspecified chronic kidney disease: Secondary | ICD-10-CM | POA: Diagnosis not present

## 2023-07-02 DIAGNOSIS — R54 Age-related physical debility: Secondary | ICD-10-CM | POA: Diagnosis not present

## 2023-07-03 DIAGNOSIS — I5042 Chronic combined systolic (congestive) and diastolic (congestive) heart failure: Secondary | ICD-10-CM | POA: Diagnosis not present

## 2023-07-03 DIAGNOSIS — N184 Chronic kidney disease, stage 4 (severe): Secondary | ICD-10-CM | POA: Diagnosis not present

## 2023-07-03 DIAGNOSIS — M150 Primary generalized (osteo)arthritis: Secondary | ICD-10-CM | POA: Diagnosis not present

## 2023-07-03 DIAGNOSIS — I4892 Unspecified atrial flutter: Secondary | ICD-10-CM | POA: Diagnosis not present

## 2023-07-03 DIAGNOSIS — E1142 Type 2 diabetes mellitus with diabetic polyneuropathy: Secondary | ICD-10-CM | POA: Diagnosis not present

## 2023-07-03 DIAGNOSIS — E113293 Type 2 diabetes mellitus with mild nonproliferative diabetic retinopathy without macular edema, bilateral: Secondary | ICD-10-CM | POA: Diagnosis not present

## 2023-07-03 DIAGNOSIS — E1122 Type 2 diabetes mellitus with diabetic chronic kidney disease: Secondary | ICD-10-CM | POA: Diagnosis not present

## 2023-07-03 DIAGNOSIS — I130 Hypertensive heart and chronic kidney disease with heart failure and stage 1 through stage 4 chronic kidney disease, or unspecified chronic kidney disease: Secondary | ICD-10-CM | POA: Diagnosis not present

## 2023-07-03 DIAGNOSIS — I2510 Atherosclerotic heart disease of native coronary artery without angina pectoris: Secondary | ICD-10-CM | POA: Diagnosis not present

## 2023-07-06 DIAGNOSIS — I5042 Chronic combined systolic (congestive) and diastolic (congestive) heart failure: Secondary | ICD-10-CM | POA: Diagnosis not present

## 2023-07-06 DIAGNOSIS — W19XXXA Unspecified fall, initial encounter: Secondary | ICD-10-CM | POA: Diagnosis not present

## 2023-07-06 DIAGNOSIS — R627 Adult failure to thrive: Secondary | ICD-10-CM | POA: Diagnosis not present

## 2023-07-06 DIAGNOSIS — E113293 Type 2 diabetes mellitus with mild nonproliferative diabetic retinopathy without macular edema, bilateral: Secondary | ICD-10-CM | POA: Diagnosis not present

## 2023-07-06 DIAGNOSIS — R531 Weakness: Secondary | ICD-10-CM | POA: Diagnosis not present

## 2023-07-06 DIAGNOSIS — Z556 Problems related to health literacy: Secondary | ICD-10-CM | POA: Diagnosis not present

## 2023-07-06 DIAGNOSIS — I10 Essential (primary) hypertension: Secondary | ICD-10-CM | POA: Diagnosis not present

## 2023-07-06 DIAGNOSIS — I2510 Atherosclerotic heart disease of native coronary artery without angina pectoris: Secondary | ICD-10-CM | POA: Diagnosis not present

## 2023-07-06 DIAGNOSIS — I130 Hypertensive heart and chronic kidney disease with heart failure and stage 1 through stage 4 chronic kidney disease, or unspecified chronic kidney disease: Secondary | ICD-10-CM | POA: Diagnosis not present

## 2023-07-06 DIAGNOSIS — E1142 Type 2 diabetes mellitus with diabetic polyneuropathy: Secondary | ICD-10-CM | POA: Diagnosis not present

## 2023-07-06 DIAGNOSIS — Z9181 History of falling: Secondary | ICD-10-CM | POA: Diagnosis not present

## 2023-07-06 DIAGNOSIS — I4892 Unspecified atrial flutter: Secondary | ICD-10-CM | POA: Diagnosis not present

## 2023-07-06 DIAGNOSIS — R062 Wheezing: Secondary | ICD-10-CM | POA: Diagnosis not present

## 2023-07-06 DIAGNOSIS — R5383 Other fatigue: Secondary | ICD-10-CM | POA: Diagnosis not present

## 2023-07-06 DIAGNOSIS — E1122 Type 2 diabetes mellitus with diabetic chronic kidney disease: Secondary | ICD-10-CM | POA: Diagnosis not present

## 2023-07-06 DIAGNOSIS — R0602 Shortness of breath: Secondary | ICD-10-CM | POA: Diagnosis not present

## 2023-07-06 DIAGNOSIS — N184 Chronic kidney disease, stage 4 (severe): Secondary | ICD-10-CM | POA: Diagnosis not present

## 2023-07-06 DIAGNOSIS — R4182 Altered mental status, unspecified: Secondary | ICD-10-CM | POA: Diagnosis not present

## 2023-07-06 DIAGNOSIS — Z87891 Personal history of nicotine dependence: Secondary | ICD-10-CM | POA: Diagnosis not present

## 2023-07-06 DIAGNOSIS — M150 Primary generalized (osteo)arthritis: Secondary | ICD-10-CM | POA: Diagnosis not present

## 2023-07-09 DIAGNOSIS — E66812 Obesity, class 2: Secondary | ICD-10-CM | POA: Diagnosis not present

## 2023-07-09 DIAGNOSIS — N400 Enlarged prostate without lower urinary tract symptoms: Secondary | ICD-10-CM | POA: Diagnosis not present

## 2023-07-09 DIAGNOSIS — I639 Cerebral infarction, unspecified: Secondary | ICD-10-CM | POA: Diagnosis not present

## 2023-07-09 DIAGNOSIS — K2101 Gastro-esophageal reflux disease with esophagitis, with bleeding: Secondary | ICD-10-CM | POA: Diagnosis not present

## 2023-07-09 DIAGNOSIS — R627 Adult failure to thrive: Secondary | ICD-10-CM | POA: Diagnosis not present

## 2023-07-09 DIAGNOSIS — R0602 Shortness of breath: Secondary | ICD-10-CM | POA: Diagnosis not present

## 2023-07-09 DIAGNOSIS — R531 Weakness: Secondary | ICD-10-CM | POA: Diagnosis not present

## 2023-07-09 DIAGNOSIS — K3189 Other diseases of stomach and duodenum: Secondary | ICD-10-CM | POA: Diagnosis not present

## 2023-07-09 DIAGNOSIS — I69354 Hemiplegia and hemiparesis following cerebral infarction affecting left non-dominant side: Secondary | ICD-10-CM | POA: Diagnosis not present

## 2023-07-09 DIAGNOSIS — K2211 Ulcer of esophagus with bleeding: Secondary | ICD-10-CM | POA: Diagnosis not present

## 2023-07-09 DIAGNOSIS — R1111 Vomiting without nausea: Secondary | ICD-10-CM | POA: Diagnosis not present

## 2023-07-09 DIAGNOSIS — Z794 Long term (current) use of insulin: Secondary | ICD-10-CM | POA: Diagnosis not present

## 2023-07-09 DIAGNOSIS — J690 Pneumonitis due to inhalation of food and vomit: Secondary | ICD-10-CM | POA: Diagnosis not present

## 2023-07-09 DIAGNOSIS — E1122 Type 2 diabetes mellitus with diabetic chronic kidney disease: Secondary | ICD-10-CM | POA: Diagnosis not present

## 2023-07-09 DIAGNOSIS — I25119 Atherosclerotic heart disease of native coronary artery with unspecified angina pectoris: Secondary | ICD-10-CM | POA: Diagnosis not present

## 2023-07-09 DIAGNOSIS — R0989 Other specified symptoms and signs involving the circulatory and respiratory systems: Secondary | ICD-10-CM | POA: Diagnosis not present

## 2023-07-09 DIAGNOSIS — A419 Sepsis, unspecified organism: Secondary | ICD-10-CM | POA: Diagnosis not present

## 2023-07-09 DIAGNOSIS — E1159 Type 2 diabetes mellitus with other circulatory complications: Secondary | ICD-10-CM | POA: Diagnosis not present

## 2023-07-09 DIAGNOSIS — N184 Chronic kidney disease, stage 4 (severe): Secondary | ICD-10-CM | POA: Diagnosis not present

## 2023-07-09 DIAGNOSIS — Z9181 History of falling: Secondary | ICD-10-CM | POA: Diagnosis not present

## 2023-07-09 DIAGNOSIS — N1831 Chronic kidney disease, stage 3a: Secondary | ICD-10-CM | POA: Diagnosis not present

## 2023-07-09 DIAGNOSIS — G2581 Restless legs syndrome: Secondary | ICD-10-CM | POA: Diagnosis not present

## 2023-07-09 DIAGNOSIS — K922 Gastrointestinal hemorrhage, unspecified: Secondary | ICD-10-CM | POA: Diagnosis not present

## 2023-07-09 DIAGNOSIS — I130 Hypertensive heart and chronic kidney disease with heart failure and stage 1 through stage 4 chronic kidney disease, or unspecified chronic kidney disease: Secondary | ICD-10-CM | POA: Diagnosis not present

## 2023-07-09 DIAGNOSIS — J929 Pleural plaque without asbestos: Secondary | ICD-10-CM | POA: Diagnosis not present

## 2023-07-09 DIAGNOSIS — E782 Mixed hyperlipidemia: Secondary | ICD-10-CM | POA: Diagnosis not present

## 2023-07-09 DIAGNOSIS — K920 Hematemesis: Secondary | ICD-10-CM | POA: Diagnosis not present

## 2023-07-09 DIAGNOSIS — E119 Type 2 diabetes mellitus without complications: Secondary | ICD-10-CM | POA: Diagnosis not present

## 2023-07-09 DIAGNOSIS — K5900 Constipation, unspecified: Secondary | ICD-10-CM | POA: Diagnosis not present

## 2023-07-09 DIAGNOSIS — M1990 Unspecified osteoarthritis, unspecified site: Secondary | ICD-10-CM | POA: Diagnosis not present

## 2023-07-09 DIAGNOSIS — I10 Essential (primary) hypertension: Secondary | ICD-10-CM | POA: Diagnosis not present

## 2023-07-09 DIAGNOSIS — N401 Enlarged prostate with lower urinary tract symptoms: Secondary | ICD-10-CM | POA: Diagnosis not present

## 2023-07-09 DIAGNOSIS — R296 Repeated falls: Secondary | ICD-10-CM | POA: Diagnosis not present

## 2023-07-09 DIAGNOSIS — J9601 Acute respiratory failure with hypoxia: Secondary | ICD-10-CM | POA: Diagnosis not present

## 2023-07-09 DIAGNOSIS — Z556 Problems related to health literacy: Secondary | ICD-10-CM | POA: Diagnosis not present

## 2023-07-09 DIAGNOSIS — E785 Hyperlipidemia, unspecified: Secondary | ICD-10-CM | POA: Diagnosis not present

## 2023-07-09 DIAGNOSIS — I5022 Chronic systolic (congestive) heart failure: Secondary | ICD-10-CM | POA: Diagnosis not present

## 2023-07-09 DIAGNOSIS — I5020 Unspecified systolic (congestive) heart failure: Secondary | ICD-10-CM | POA: Diagnosis not present

## 2023-07-09 DIAGNOSIS — M6281 Muscle weakness (generalized): Secondary | ICD-10-CM | POA: Diagnosis not present

## 2023-07-09 DIAGNOSIS — J90 Pleural effusion, not elsewhere classified: Secondary | ICD-10-CM | POA: Diagnosis not present

## 2023-07-09 DIAGNOSIS — I2510 Atherosclerotic heart disease of native coronary artery without angina pectoris: Secondary | ICD-10-CM | POA: Diagnosis not present

## 2023-07-09 DIAGNOSIS — N189 Chronic kidney disease, unspecified: Secondary | ICD-10-CM | POA: Diagnosis not present

## 2023-07-09 DIAGNOSIS — K5790 Diverticulosis of intestine, part unspecified, without perforation or abscess without bleeding: Secondary | ICD-10-CM | POA: Diagnosis not present

## 2023-07-09 DIAGNOSIS — I129 Hypertensive chronic kidney disease with stage 1 through stage 4 chronic kidney disease, or unspecified chronic kidney disease: Secondary | ICD-10-CM | POA: Diagnosis not present

## 2023-07-13 DIAGNOSIS — E119 Type 2 diabetes mellitus without complications: Secondary | ICD-10-CM | POA: Diagnosis not present

## 2023-07-13 DIAGNOSIS — I2510 Atherosclerotic heart disease of native coronary artery without angina pectoris: Secondary | ICD-10-CM | POA: Diagnosis not present

## 2023-07-13 DIAGNOSIS — N400 Enlarged prostate without lower urinary tract symptoms: Secondary | ICD-10-CM | POA: Diagnosis not present

## 2023-07-13 DIAGNOSIS — I10 Essential (primary) hypertension: Secondary | ICD-10-CM | POA: Diagnosis not present

## 2023-07-13 DIAGNOSIS — E782 Mixed hyperlipidemia: Secondary | ICD-10-CM | POA: Diagnosis not present

## 2023-07-13 DIAGNOSIS — M6281 Muscle weakness (generalized): Secondary | ICD-10-CM | POA: Diagnosis not present

## 2023-07-14 DIAGNOSIS — I5022 Chronic systolic (congestive) heart failure: Secondary | ICD-10-CM | POA: Diagnosis not present

## 2023-07-14 DIAGNOSIS — R627 Adult failure to thrive: Secondary | ICD-10-CM | POA: Diagnosis not present

## 2023-07-14 DIAGNOSIS — N1831 Chronic kidney disease, stage 3a: Secondary | ICD-10-CM | POA: Diagnosis not present

## 2023-07-14 DIAGNOSIS — G2581 Restless legs syndrome: Secondary | ICD-10-CM | POA: Diagnosis not present

## 2023-07-14 DIAGNOSIS — R296 Repeated falls: Secondary | ICD-10-CM | POA: Diagnosis not present

## 2023-07-15 DIAGNOSIS — R296 Repeated falls: Secondary | ICD-10-CM | POA: Diagnosis not present

## 2023-07-15 DIAGNOSIS — N1831 Chronic kidney disease, stage 3a: Secondary | ICD-10-CM | POA: Diagnosis not present

## 2023-07-15 DIAGNOSIS — I5022 Chronic systolic (congestive) heart failure: Secondary | ICD-10-CM | POA: Diagnosis not present

## 2023-07-15 DIAGNOSIS — G2581 Restless legs syndrome: Secondary | ICD-10-CM | POA: Diagnosis not present

## 2023-07-15 DIAGNOSIS — R627 Adult failure to thrive: Secondary | ICD-10-CM | POA: Diagnosis not present

## 2023-07-16 ENCOUNTER — Other Ambulatory Visit (HOSPITAL_BASED_OUTPATIENT_CLINIC_OR_DEPARTMENT_OTHER): Payer: Self-pay | Admitting: Family Medicine

## 2023-07-16 DIAGNOSIS — R0989 Other specified symptoms and signs involving the circulatory and respiratory systems: Secondary | ICD-10-CM

## 2023-07-17 ENCOUNTER — Ambulatory Visit (HOSPITAL_COMMUNITY)
Admission: RE | Admit: 2023-07-17 | Discharge: 2023-07-17 | Disposition: A | Discharge status: Home / Self Care | Source: Ambulatory Visit | Attending: Vascular Surgery | Admitting: Vascular Surgery

## 2023-07-17 DIAGNOSIS — R0989 Other specified symptoms and signs involving the circulatory and respiratory systems: Secondary | ICD-10-CM | POA: Diagnosis not present

## 2023-07-17 DIAGNOSIS — I5022 Chronic systolic (congestive) heart failure: Secondary | ICD-10-CM | POA: Diagnosis not present

## 2023-07-17 DIAGNOSIS — N1831 Chronic kidney disease, stage 3a: Secondary | ICD-10-CM | POA: Diagnosis not present

## 2023-07-17 DIAGNOSIS — G2581 Restless legs syndrome: Secondary | ICD-10-CM | POA: Diagnosis not present

## 2023-07-17 DIAGNOSIS — R627 Adult failure to thrive: Secondary | ICD-10-CM | POA: Diagnosis not present

## 2023-07-17 DIAGNOSIS — R296 Repeated falls: Secondary | ICD-10-CM | POA: Diagnosis not present

## 2023-07-18 DIAGNOSIS — I25119 Atherosclerotic heart disease of native coronary artery with unspecified angina pectoris: Secondary | ICD-10-CM | POA: Diagnosis not present

## 2023-07-18 DIAGNOSIS — E1159 Type 2 diabetes mellitus with other circulatory complications: Secondary | ICD-10-CM | POA: Diagnosis not present

## 2023-07-18 DIAGNOSIS — I10 Essential (primary) hypertension: Secondary | ICD-10-CM | POA: Diagnosis not present

## 2023-07-18 DIAGNOSIS — N184 Chronic kidney disease, stage 4 (severe): Secondary | ICD-10-CM | POA: Diagnosis not present

## 2023-07-20 DIAGNOSIS — N1831 Chronic kidney disease, stage 3a: Secondary | ICD-10-CM | POA: Diagnosis not present

## 2023-07-20 DIAGNOSIS — R296 Repeated falls: Secondary | ICD-10-CM | POA: Diagnosis not present

## 2023-07-20 DIAGNOSIS — I5022 Chronic systolic (congestive) heart failure: Secondary | ICD-10-CM | POA: Diagnosis not present

## 2023-07-20 DIAGNOSIS — R627 Adult failure to thrive: Secondary | ICD-10-CM | POA: Diagnosis not present

## 2023-07-20 DIAGNOSIS — G2581 Restless legs syndrome: Secondary | ICD-10-CM | POA: Diagnosis not present

## 2023-07-20 LAB — VAS US ABI WITH/WO TBI
Left ABI: 1.64
Right ABI: 1.64

## 2023-07-23 ENCOUNTER — Ambulatory Visit: Payer: Self-pay | Admitting: Internal Medicine

## 2023-07-23 ENCOUNTER — Ambulatory Visit (INDEPENDENT_AMBULATORY_CARE_PROVIDER_SITE_OTHER)

## 2023-07-23 DIAGNOSIS — I639 Cerebral infarction, unspecified: Secondary | ICD-10-CM

## 2023-07-23 LAB — CUP PACEART REMOTE DEVICE CHECK
Date Time Interrogation Session: 20250604230316
Implantable Pulse Generator Implant Date: 20210310

## 2023-07-24 DIAGNOSIS — G2581 Restless legs syndrome: Secondary | ICD-10-CM | POA: Diagnosis not present

## 2023-07-24 DIAGNOSIS — I5022 Chronic systolic (congestive) heart failure: Secondary | ICD-10-CM | POA: Diagnosis not present

## 2023-07-24 DIAGNOSIS — R627 Adult failure to thrive: Secondary | ICD-10-CM | POA: Diagnosis not present

## 2023-07-24 DIAGNOSIS — N1831 Chronic kidney disease, stage 3a: Secondary | ICD-10-CM | POA: Diagnosis not present

## 2023-07-24 DIAGNOSIS — R296 Repeated falls: Secondary | ICD-10-CM | POA: Diagnosis not present

## 2023-07-28 DIAGNOSIS — N1831 Chronic kidney disease, stage 3a: Secondary | ICD-10-CM | POA: Diagnosis not present

## 2023-07-28 DIAGNOSIS — I5022 Chronic systolic (congestive) heart failure: Secondary | ICD-10-CM | POA: Diagnosis not present

## 2023-07-28 DIAGNOSIS — G2581 Restless legs syndrome: Secondary | ICD-10-CM | POA: Diagnosis not present

## 2023-07-28 DIAGNOSIS — R296 Repeated falls: Secondary | ICD-10-CM | POA: Diagnosis not present

## 2023-07-28 DIAGNOSIS — M6281 Muscle weakness (generalized): Secondary | ICD-10-CM | POA: Diagnosis not present

## 2023-07-28 DIAGNOSIS — R627 Adult failure to thrive: Secondary | ICD-10-CM | POA: Diagnosis not present

## 2023-08-03 DIAGNOSIS — R296 Repeated falls: Secondary | ICD-10-CM | POA: Diagnosis not present

## 2023-08-03 DIAGNOSIS — R627 Adult failure to thrive: Secondary | ICD-10-CM | POA: Diagnosis not present

## 2023-08-03 DIAGNOSIS — N1831 Chronic kidney disease, stage 3a: Secondary | ICD-10-CM | POA: Diagnosis not present

## 2023-08-03 DIAGNOSIS — G2581 Restless legs syndrome: Secondary | ICD-10-CM | POA: Diagnosis not present

## 2023-08-03 DIAGNOSIS — I5022 Chronic systolic (congestive) heart failure: Secondary | ICD-10-CM | POA: Diagnosis not present

## 2023-08-03 DIAGNOSIS — M6281 Muscle weakness (generalized): Secondary | ICD-10-CM | POA: Diagnosis not present

## 2023-08-07 DIAGNOSIS — N1831 Chronic kidney disease, stage 3a: Secondary | ICD-10-CM | POA: Diagnosis not present

## 2023-08-07 DIAGNOSIS — M6281 Muscle weakness (generalized): Secondary | ICD-10-CM | POA: Diagnosis not present

## 2023-08-07 DIAGNOSIS — R296 Repeated falls: Secondary | ICD-10-CM | POA: Diagnosis not present

## 2023-08-07 DIAGNOSIS — I5022 Chronic systolic (congestive) heart failure: Secondary | ICD-10-CM | POA: Diagnosis not present

## 2023-08-07 DIAGNOSIS — R627 Adult failure to thrive: Secondary | ICD-10-CM | POA: Diagnosis not present

## 2023-08-07 DIAGNOSIS — G2581 Restless legs syndrome: Secondary | ICD-10-CM | POA: Diagnosis not present

## 2023-08-07 NOTE — Progress Notes (Signed)
 Carelink Summary Report / Loop Recorder

## 2023-08-09 DIAGNOSIS — Z794 Long term (current) use of insulin: Secondary | ICD-10-CM | POA: Diagnosis not present

## 2023-08-09 DIAGNOSIS — E669 Obesity, unspecified: Secondary | ICD-10-CM | POA: Diagnosis not present

## 2023-08-09 DIAGNOSIS — J690 Pneumonitis due to inhalation of food and vomit: Secondary | ICD-10-CM | POA: Diagnosis not present

## 2023-08-09 DIAGNOSIS — Z7902 Long term (current) use of antithrombotics/antiplatelets: Secondary | ICD-10-CM | POA: Diagnosis not present

## 2023-08-09 DIAGNOSIS — K920 Hematemesis: Secondary | ICD-10-CM | POA: Diagnosis not present

## 2023-08-09 DIAGNOSIS — I10 Essential (primary) hypertension: Secondary | ICD-10-CM | POA: Diagnosis not present

## 2023-08-09 DIAGNOSIS — I088 Other rheumatic multiple valve diseases: Secondary | ICD-10-CM | POA: Diagnosis not present

## 2023-08-09 DIAGNOSIS — I509 Heart failure, unspecified: Secondary | ICD-10-CM | POA: Diagnosis not present

## 2023-08-09 DIAGNOSIS — K2101 Gastro-esophageal reflux disease with esophagitis, with bleeding: Secondary | ICD-10-CM | POA: Diagnosis not present

## 2023-08-09 DIAGNOSIS — E1122 Type 2 diabetes mellitus with diabetic chronic kidney disease: Secondary | ICD-10-CM | POA: Diagnosis not present

## 2023-08-09 DIAGNOSIS — I69354 Hemiplegia and hemiparesis following cerebral infarction affecting left non-dominant side: Secondary | ICD-10-CM | POA: Diagnosis not present

## 2023-08-09 DIAGNOSIS — K3189 Other diseases of stomach and duodenum: Secondary | ICD-10-CM | POA: Diagnosis not present

## 2023-08-09 DIAGNOSIS — M6281 Muscle weakness (generalized): Secondary | ICD-10-CM | POA: Diagnosis not present

## 2023-08-09 DIAGNOSIS — K2950 Unspecified chronic gastritis without bleeding: Secondary | ICD-10-CM | POA: Diagnosis not present

## 2023-08-09 DIAGNOSIS — N1831 Chronic kidney disease, stage 3a: Secondary | ICD-10-CM | POA: Diagnosis not present

## 2023-08-09 DIAGNOSIS — I5022 Chronic systolic (congestive) heart failure: Secondary | ICD-10-CM | POA: Diagnosis not present

## 2023-08-09 DIAGNOSIS — K5790 Diverticulosis of intestine, part unspecified, without perforation or abscess without bleeding: Secondary | ICD-10-CM | POA: Diagnosis not present

## 2023-08-09 DIAGNOSIS — K5900 Constipation, unspecified: Secondary | ICD-10-CM | POA: Diagnosis not present

## 2023-08-09 DIAGNOSIS — J9600 Acute respiratory failure, unspecified whether with hypoxia or hypercapnia: Secondary | ICD-10-CM | POA: Diagnosis not present

## 2023-08-09 DIAGNOSIS — N401 Enlarged prostate with lower urinary tract symptoms: Secondary | ICD-10-CM | POA: Diagnosis not present

## 2023-08-09 DIAGNOSIS — K5730 Diverticulosis of large intestine without perforation or abscess without bleeding: Secondary | ICD-10-CM | POA: Diagnosis not present

## 2023-08-09 DIAGNOSIS — I130 Hypertensive heart and chronic kidney disease with heart failure and stage 1 through stage 4 chronic kidney disease, or unspecified chronic kidney disease: Secondary | ICD-10-CM | POA: Diagnosis not present

## 2023-08-09 DIAGNOSIS — K2211 Ulcer of esophagus with bleeding: Secondary | ICD-10-CM | POA: Diagnosis not present

## 2023-08-09 DIAGNOSIS — J9601 Acute respiratory failure with hypoxia: Secondary | ICD-10-CM | POA: Diagnosis not present

## 2023-08-09 DIAGNOSIS — J90 Pleural effusion, not elsewhere classified: Secondary | ICD-10-CM | POA: Diagnosis not present

## 2023-08-09 DIAGNOSIS — I129 Hypertensive chronic kidney disease with stage 1 through stage 4 chronic kidney disease, or unspecified chronic kidney disease: Secondary | ICD-10-CM | POA: Diagnosis not present

## 2023-08-09 DIAGNOSIS — K922 Gastrointestinal hemorrhage, unspecified: Secondary | ICD-10-CM | POA: Diagnosis not present

## 2023-08-09 DIAGNOSIS — E66812 Obesity, class 2: Secondary | ICD-10-CM | POA: Diagnosis not present

## 2023-08-09 DIAGNOSIS — K2210 Ulcer of esophagus without bleeding: Secondary | ICD-10-CM | POA: Diagnosis not present

## 2023-08-09 DIAGNOSIS — A419 Sepsis, unspecified organism: Secondary | ICD-10-CM | POA: Diagnosis not present

## 2023-08-09 DIAGNOSIS — K2289 Other specified disease of esophagus: Secondary | ICD-10-CM | POA: Diagnosis not present

## 2023-08-09 DIAGNOSIS — R627 Adult failure to thrive: Secondary | ICD-10-CM | POA: Diagnosis not present

## 2023-08-09 DIAGNOSIS — K219 Gastro-esophageal reflux disease without esophagitis: Secondary | ICD-10-CM | POA: Diagnosis not present

## 2023-08-09 DIAGNOSIS — I2510 Atherosclerotic heart disease of native coronary artery without angina pectoris: Secondary | ICD-10-CM | POA: Diagnosis not present

## 2023-08-09 DIAGNOSIS — N189 Chronic kidney disease, unspecified: Secondary | ICD-10-CM | POA: Diagnosis not present

## 2023-08-09 DIAGNOSIS — Z7982 Long term (current) use of aspirin: Secondary | ICD-10-CM | POA: Diagnosis not present

## 2023-08-09 DIAGNOSIS — R1111 Vomiting without nausea: Secondary | ICD-10-CM | POA: Diagnosis not present

## 2023-08-09 DIAGNOSIS — E119 Type 2 diabetes mellitus without complications: Secondary | ICD-10-CM | POA: Diagnosis not present

## 2023-08-09 DIAGNOSIS — J929 Pleural plaque without asbestos: Secondary | ICD-10-CM | POA: Diagnosis not present

## 2023-08-09 DIAGNOSIS — L600 Ingrowing nail: Secondary | ICD-10-CM | POA: Diagnosis not present

## 2023-08-09 DIAGNOSIS — Z48815 Encounter for surgical aftercare following surgery on the digestive system: Secondary | ICD-10-CM | POA: Diagnosis not present

## 2023-08-10 DIAGNOSIS — K2950 Unspecified chronic gastritis without bleeding: Secondary | ICD-10-CM | POA: Diagnosis not present

## 2023-08-10 DIAGNOSIS — I2510 Atherosclerotic heart disease of native coronary artery without angina pectoris: Secondary | ICD-10-CM | POA: Diagnosis not present

## 2023-08-10 DIAGNOSIS — K920 Hematemesis: Secondary | ICD-10-CM | POA: Diagnosis not present

## 2023-08-10 DIAGNOSIS — E669 Obesity, unspecified: Secondary | ICD-10-CM | POA: Diagnosis not present

## 2023-08-10 DIAGNOSIS — K2289 Other specified disease of esophagus: Secondary | ICD-10-CM | POA: Diagnosis not present

## 2023-08-10 DIAGNOSIS — E1122 Type 2 diabetes mellitus with diabetic chronic kidney disease: Secondary | ICD-10-CM | POA: Diagnosis not present

## 2023-08-10 DIAGNOSIS — K219 Gastro-esophageal reflux disease without esophagitis: Secondary | ICD-10-CM | POA: Diagnosis not present

## 2023-08-10 DIAGNOSIS — I130 Hypertensive heart and chronic kidney disease with heart failure and stage 1 through stage 4 chronic kidney disease, or unspecified chronic kidney disease: Secondary | ICD-10-CM | POA: Diagnosis not present

## 2023-08-10 DIAGNOSIS — I509 Heart failure, unspecified: Secondary | ICD-10-CM | POA: Diagnosis not present

## 2023-08-10 DIAGNOSIS — N189 Chronic kidney disease, unspecified: Secondary | ICD-10-CM | POA: Diagnosis not present

## 2023-08-11 DIAGNOSIS — M6281 Muscle weakness (generalized): Secondary | ICD-10-CM | POA: Diagnosis not present

## 2023-08-11 DIAGNOSIS — N1831 Chronic kidney disease, stage 3a: Secondary | ICD-10-CM | POA: Diagnosis not present

## 2023-08-11 DIAGNOSIS — L600 Ingrowing nail: Secondary | ICD-10-CM | POA: Diagnosis not present

## 2023-08-11 DIAGNOSIS — E119 Type 2 diabetes mellitus without complications: Secondary | ICD-10-CM | POA: Diagnosis not present

## 2023-08-11 DIAGNOSIS — I088 Other rheumatic multiple valve diseases: Secondary | ICD-10-CM | POA: Diagnosis not present

## 2023-08-12 DIAGNOSIS — J9601 Acute respiratory failure with hypoxia: Secondary | ICD-10-CM | POA: Diagnosis not present

## 2023-08-13 DIAGNOSIS — E1122 Type 2 diabetes mellitus with diabetic chronic kidney disease: Secondary | ICD-10-CM | POA: Diagnosis not present

## 2023-08-13 DIAGNOSIS — I5022 Chronic systolic (congestive) heart failure: Secondary | ICD-10-CM | POA: Diagnosis not present

## 2023-08-13 DIAGNOSIS — J811 Chronic pulmonary edema: Secondary | ICD-10-CM | POA: Diagnosis not present

## 2023-08-13 DIAGNOSIS — E1159 Type 2 diabetes mellitus with other circulatory complications: Secondary | ICD-10-CM | POA: Diagnosis not present

## 2023-08-13 DIAGNOSIS — K5730 Diverticulosis of large intestine without perforation or abscess without bleeding: Secondary | ICD-10-CM | POA: Diagnosis not present

## 2023-08-13 DIAGNOSIS — E782 Mixed hyperlipidemia: Secondary | ICD-10-CM | POA: Diagnosis not present

## 2023-08-13 DIAGNOSIS — R0989 Other specified symptoms and signs involving the circulatory and respiratory systems: Secondary | ICD-10-CM | POA: Diagnosis not present

## 2023-08-13 DIAGNOSIS — M6281 Muscle weakness (generalized): Secondary | ICD-10-CM | POA: Diagnosis not present

## 2023-08-13 DIAGNOSIS — J9600 Acute respiratory failure, unspecified whether with hypoxia or hypercapnia: Secondary | ICD-10-CM | POA: Diagnosis not present

## 2023-08-13 DIAGNOSIS — R627 Adult failure to thrive: Secondary | ICD-10-CM | POA: Diagnosis not present

## 2023-08-13 DIAGNOSIS — I130 Hypertensive heart and chronic kidney disease with heart failure and stage 1 through stage 4 chronic kidney disease, or unspecified chronic kidney disease: Secondary | ICD-10-CM | POA: Diagnosis not present

## 2023-08-13 DIAGNOSIS — E119 Type 2 diabetes mellitus without complications: Secondary | ICD-10-CM | POA: Diagnosis not present

## 2023-08-13 DIAGNOSIS — I509 Heart failure, unspecified: Secondary | ICD-10-CM | POA: Diagnosis not present

## 2023-08-13 DIAGNOSIS — Z794 Long term (current) use of insulin: Secondary | ICD-10-CM | POA: Diagnosis not present

## 2023-08-13 DIAGNOSIS — I25119 Atherosclerotic heart disease of native coronary artery with unspecified angina pectoris: Secondary | ICD-10-CM | POA: Diagnosis not present

## 2023-08-13 DIAGNOSIS — K2210 Ulcer of esophagus without bleeding: Secondary | ICD-10-CM | POA: Diagnosis not present

## 2023-08-13 DIAGNOSIS — J9601 Acute respiratory failure with hypoxia: Secondary | ICD-10-CM | POA: Diagnosis not present

## 2023-08-13 DIAGNOSIS — Z48815 Encounter for surgical aftercare following surgery on the digestive system: Secondary | ICD-10-CM | POA: Diagnosis not present

## 2023-08-13 DIAGNOSIS — K920 Hematemesis: Secondary | ICD-10-CM | POA: Diagnosis not present

## 2023-08-13 DIAGNOSIS — N184 Chronic kidney disease, stage 4 (severe): Secondary | ICD-10-CM | POA: Diagnosis not present

## 2023-08-13 DIAGNOSIS — I10 Essential (primary) hypertension: Secondary | ICD-10-CM | POA: Diagnosis not present

## 2023-08-13 DIAGNOSIS — J189 Pneumonia, unspecified organism: Secondary | ICD-10-CM | POA: Diagnosis not present

## 2023-08-13 DIAGNOSIS — I2510 Atherosclerotic heart disease of native coronary artery without angina pectoris: Secondary | ICD-10-CM | POA: Diagnosis not present

## 2023-08-13 DIAGNOSIS — N1831 Chronic kidney disease, stage 3a: Secondary | ICD-10-CM | POA: Diagnosis not present

## 2023-08-14 DIAGNOSIS — M6281 Muscle weakness (generalized): Secondary | ICD-10-CM | POA: Diagnosis not present

## 2023-08-14 DIAGNOSIS — I2510 Atherosclerotic heart disease of native coronary artery without angina pectoris: Secondary | ICD-10-CM | POA: Diagnosis not present

## 2023-08-14 DIAGNOSIS — I10 Essential (primary) hypertension: Secondary | ICD-10-CM | POA: Diagnosis not present

## 2023-08-14 DIAGNOSIS — R627 Adult failure to thrive: Secondary | ICD-10-CM | POA: Diagnosis not present

## 2023-08-14 DIAGNOSIS — E782 Mixed hyperlipidemia: Secondary | ICD-10-CM | POA: Diagnosis not present

## 2023-08-14 DIAGNOSIS — J9601 Acute respiratory failure with hypoxia: Secondary | ICD-10-CM | POA: Diagnosis not present

## 2023-08-14 DIAGNOSIS — I5022 Chronic systolic (congestive) heart failure: Secondary | ICD-10-CM | POA: Diagnosis not present

## 2023-08-14 DIAGNOSIS — E119 Type 2 diabetes mellitus without complications: Secondary | ICD-10-CM | POA: Diagnosis not present

## 2023-08-17 DIAGNOSIS — E119 Type 2 diabetes mellitus without complications: Secondary | ICD-10-CM | POA: Diagnosis not present

## 2023-08-17 DIAGNOSIS — I25119 Atherosclerotic heart disease of native coronary artery with unspecified angina pectoris: Secondary | ICD-10-CM | POA: Diagnosis not present

## 2023-08-17 DIAGNOSIS — I5022 Chronic systolic (congestive) heart failure: Secondary | ICD-10-CM | POA: Diagnosis not present

## 2023-08-17 DIAGNOSIS — M6281 Muscle weakness (generalized): Secondary | ICD-10-CM | POA: Diagnosis not present

## 2023-08-17 DIAGNOSIS — N184 Chronic kidney disease, stage 4 (severe): Secondary | ICD-10-CM | POA: Diagnosis not present

## 2023-08-17 DIAGNOSIS — E1159 Type 2 diabetes mellitus with other circulatory complications: Secondary | ICD-10-CM | POA: Diagnosis not present

## 2023-08-17 DIAGNOSIS — R627 Adult failure to thrive: Secondary | ICD-10-CM | POA: Diagnosis not present

## 2023-08-17 DIAGNOSIS — I2510 Atherosclerotic heart disease of native coronary artery without angina pectoris: Secondary | ICD-10-CM | POA: Diagnosis not present

## 2023-08-17 DIAGNOSIS — E782 Mixed hyperlipidemia: Secondary | ICD-10-CM | POA: Diagnosis not present

## 2023-08-17 DIAGNOSIS — I10 Essential (primary) hypertension: Secondary | ICD-10-CM | POA: Diagnosis not present

## 2023-08-17 DIAGNOSIS — J9601 Acute respiratory failure with hypoxia: Secondary | ICD-10-CM | POA: Diagnosis not present

## 2023-08-19 DIAGNOSIS — J9601 Acute respiratory failure with hypoxia: Secondary | ICD-10-CM | POA: Diagnosis not present

## 2023-08-19 DIAGNOSIS — M6281 Muscle weakness (generalized): Secondary | ICD-10-CM | POA: Diagnosis not present

## 2023-08-19 DIAGNOSIS — E119 Type 2 diabetes mellitus without complications: Secondary | ICD-10-CM | POA: Diagnosis not present

## 2023-08-19 DIAGNOSIS — I10 Essential (primary) hypertension: Secondary | ICD-10-CM | POA: Diagnosis not present

## 2023-08-19 DIAGNOSIS — E782 Mixed hyperlipidemia: Secondary | ICD-10-CM | POA: Diagnosis not present

## 2023-08-19 DIAGNOSIS — I2510 Atherosclerotic heart disease of native coronary artery without angina pectoris: Secondary | ICD-10-CM | POA: Diagnosis not present

## 2023-08-19 DIAGNOSIS — I5022 Chronic systolic (congestive) heart failure: Secondary | ICD-10-CM | POA: Diagnosis not present

## 2023-08-19 DIAGNOSIS — R627 Adult failure to thrive: Secondary | ICD-10-CM | POA: Diagnosis not present

## 2023-08-20 DIAGNOSIS — J9601 Acute respiratory failure with hypoxia: Secondary | ICD-10-CM | POA: Diagnosis not present

## 2023-08-20 DIAGNOSIS — I5022 Chronic systolic (congestive) heart failure: Secondary | ICD-10-CM | POA: Diagnosis not present

## 2023-08-20 DIAGNOSIS — I2510 Atherosclerotic heart disease of native coronary artery without angina pectoris: Secondary | ICD-10-CM | POA: Diagnosis not present

## 2023-08-20 DIAGNOSIS — M6281 Muscle weakness (generalized): Secondary | ICD-10-CM | POA: Diagnosis not present

## 2023-08-21 DIAGNOSIS — J9601 Acute respiratory failure with hypoxia: Secondary | ICD-10-CM | POA: Diagnosis not present

## 2023-08-21 DIAGNOSIS — E782 Mixed hyperlipidemia: Secondary | ICD-10-CM | POA: Diagnosis not present

## 2023-08-21 DIAGNOSIS — I10 Essential (primary) hypertension: Secondary | ICD-10-CM | POA: Diagnosis not present

## 2023-08-21 DIAGNOSIS — I2510 Atherosclerotic heart disease of native coronary artery without angina pectoris: Secondary | ICD-10-CM | POA: Diagnosis not present

## 2023-08-21 DIAGNOSIS — I5022 Chronic systolic (congestive) heart failure: Secondary | ICD-10-CM | POA: Diagnosis not present

## 2023-08-21 DIAGNOSIS — M6281 Muscle weakness (generalized): Secondary | ICD-10-CM | POA: Diagnosis not present

## 2023-08-21 DIAGNOSIS — R627 Adult failure to thrive: Secondary | ICD-10-CM | POA: Diagnosis not present

## 2023-08-21 DIAGNOSIS — E119 Type 2 diabetes mellitus without complications: Secondary | ICD-10-CM | POA: Diagnosis not present

## 2023-08-24 ENCOUNTER — Encounter

## 2023-08-24 DIAGNOSIS — I10 Essential (primary) hypertension: Secondary | ICD-10-CM | POA: Diagnosis not present

## 2023-08-24 DIAGNOSIS — E782 Mixed hyperlipidemia: Secondary | ICD-10-CM | POA: Diagnosis not present

## 2023-08-24 DIAGNOSIS — I5022 Chronic systolic (congestive) heart failure: Secondary | ICD-10-CM | POA: Diagnosis not present

## 2023-08-24 DIAGNOSIS — R627 Adult failure to thrive: Secondary | ICD-10-CM | POA: Diagnosis not present

## 2023-08-24 DIAGNOSIS — J9601 Acute respiratory failure with hypoxia: Secondary | ICD-10-CM | POA: Diagnosis not present

## 2023-08-24 DIAGNOSIS — M6281 Muscle weakness (generalized): Secondary | ICD-10-CM | POA: Diagnosis not present

## 2023-08-24 DIAGNOSIS — E119 Type 2 diabetes mellitus without complications: Secondary | ICD-10-CM | POA: Diagnosis not present

## 2023-08-24 DIAGNOSIS — I2510 Atherosclerotic heart disease of native coronary artery without angina pectoris: Secondary | ICD-10-CM | POA: Diagnosis not present

## 2023-08-25 DIAGNOSIS — J9601 Acute respiratory failure with hypoxia: Secondary | ICD-10-CM | POA: Diagnosis not present

## 2023-08-25 DIAGNOSIS — I5022 Chronic systolic (congestive) heart failure: Secondary | ICD-10-CM | POA: Diagnosis not present

## 2023-08-25 DIAGNOSIS — I2510 Atherosclerotic heart disease of native coronary artery without angina pectoris: Secondary | ICD-10-CM | POA: Diagnosis not present

## 2023-08-25 DIAGNOSIS — M6281 Muscle weakness (generalized): Secondary | ICD-10-CM | POA: Diagnosis not present

## 2023-08-28 DIAGNOSIS — R627 Adult failure to thrive: Secondary | ICD-10-CM | POA: Diagnosis not present

## 2023-08-28 DIAGNOSIS — J9601 Acute respiratory failure with hypoxia: Secondary | ICD-10-CM | POA: Diagnosis not present

## 2023-08-28 DIAGNOSIS — I10 Essential (primary) hypertension: Secondary | ICD-10-CM | POA: Diagnosis not present

## 2023-08-28 DIAGNOSIS — I2510 Atherosclerotic heart disease of native coronary artery without angina pectoris: Secondary | ICD-10-CM | POA: Diagnosis not present

## 2023-08-28 DIAGNOSIS — E119 Type 2 diabetes mellitus without complications: Secondary | ICD-10-CM | POA: Diagnosis not present

## 2023-08-28 DIAGNOSIS — I5022 Chronic systolic (congestive) heart failure: Secondary | ICD-10-CM | POA: Diagnosis not present

## 2023-08-28 DIAGNOSIS — M6281 Muscle weakness (generalized): Secondary | ICD-10-CM | POA: Diagnosis not present

## 2023-08-28 DIAGNOSIS — E782 Mixed hyperlipidemia: Secondary | ICD-10-CM | POA: Diagnosis not present

## 2023-08-31 DIAGNOSIS — I5022 Chronic systolic (congestive) heart failure: Secondary | ICD-10-CM | POA: Diagnosis not present

## 2023-08-31 DIAGNOSIS — E782 Mixed hyperlipidemia: Secondary | ICD-10-CM | POA: Diagnosis not present

## 2023-08-31 DIAGNOSIS — E119 Type 2 diabetes mellitus without complications: Secondary | ICD-10-CM | POA: Diagnosis not present

## 2023-08-31 DIAGNOSIS — J9601 Acute respiratory failure with hypoxia: Secondary | ICD-10-CM | POA: Diagnosis not present

## 2023-08-31 DIAGNOSIS — I2510 Atherosclerotic heart disease of native coronary artery without angina pectoris: Secondary | ICD-10-CM | POA: Diagnosis not present

## 2023-08-31 DIAGNOSIS — I10 Essential (primary) hypertension: Secondary | ICD-10-CM | POA: Diagnosis not present

## 2023-08-31 DIAGNOSIS — M6281 Muscle weakness (generalized): Secondary | ICD-10-CM | POA: Diagnosis not present

## 2023-08-31 DIAGNOSIS — R627 Adult failure to thrive: Secondary | ICD-10-CM | POA: Diagnosis not present

## 2023-09-01 DIAGNOSIS — M6281 Muscle weakness (generalized): Secondary | ICD-10-CM | POA: Diagnosis not present

## 2023-09-01 DIAGNOSIS — J9601 Acute respiratory failure with hypoxia: Secondary | ICD-10-CM | POA: Diagnosis not present

## 2023-09-01 DIAGNOSIS — I5022 Chronic systolic (congestive) heart failure: Secondary | ICD-10-CM | POA: Diagnosis not present

## 2023-09-01 DIAGNOSIS — I2510 Atherosclerotic heart disease of native coronary artery without angina pectoris: Secondary | ICD-10-CM | POA: Diagnosis not present

## 2023-09-04 DIAGNOSIS — E119 Type 2 diabetes mellitus without complications: Secondary | ICD-10-CM | POA: Diagnosis not present

## 2023-09-04 DIAGNOSIS — I10 Essential (primary) hypertension: Secondary | ICD-10-CM | POA: Diagnosis not present

## 2023-09-04 DIAGNOSIS — J9601 Acute respiratory failure with hypoxia: Secondary | ICD-10-CM | POA: Diagnosis not present

## 2023-09-04 DIAGNOSIS — I5022 Chronic systolic (congestive) heart failure: Secondary | ICD-10-CM | POA: Diagnosis not present

## 2023-09-04 DIAGNOSIS — R627 Adult failure to thrive: Secondary | ICD-10-CM | POA: Diagnosis not present

## 2023-09-04 DIAGNOSIS — I2510 Atherosclerotic heart disease of native coronary artery without angina pectoris: Secondary | ICD-10-CM | POA: Diagnosis not present

## 2023-09-04 DIAGNOSIS — E782 Mixed hyperlipidemia: Secondary | ICD-10-CM | POA: Diagnosis not present

## 2023-09-04 DIAGNOSIS — M6281 Muscle weakness (generalized): Secondary | ICD-10-CM | POA: Diagnosis not present

## 2023-09-07 DIAGNOSIS — E782 Mixed hyperlipidemia: Secondary | ICD-10-CM | POA: Diagnosis not present

## 2023-09-07 DIAGNOSIS — I5022 Chronic systolic (congestive) heart failure: Secondary | ICD-10-CM | POA: Diagnosis not present

## 2023-09-07 DIAGNOSIS — R627 Adult failure to thrive: Secondary | ICD-10-CM | POA: Diagnosis not present

## 2023-09-07 DIAGNOSIS — I2510 Atherosclerotic heart disease of native coronary artery without angina pectoris: Secondary | ICD-10-CM | POA: Diagnosis not present

## 2023-09-07 DIAGNOSIS — I10 Essential (primary) hypertension: Secondary | ICD-10-CM | POA: Diagnosis not present

## 2023-09-07 DIAGNOSIS — J9601 Acute respiratory failure with hypoxia: Secondary | ICD-10-CM | POA: Diagnosis not present

## 2023-09-07 DIAGNOSIS — E119 Type 2 diabetes mellitus without complications: Secondary | ICD-10-CM | POA: Diagnosis not present

## 2023-09-07 DIAGNOSIS — M6281 Muscle weakness (generalized): Secondary | ICD-10-CM | POA: Diagnosis not present

## 2023-09-08 DIAGNOSIS — I10 Essential (primary) hypertension: Secondary | ICD-10-CM | POA: Diagnosis not present

## 2023-09-08 DIAGNOSIS — E782 Mixed hyperlipidemia: Secondary | ICD-10-CM | POA: Diagnosis not present

## 2023-09-08 DIAGNOSIS — M6281 Muscle weakness (generalized): Secondary | ICD-10-CM | POA: Diagnosis not present

## 2023-09-08 DIAGNOSIS — J9601 Acute respiratory failure with hypoxia: Secondary | ICD-10-CM | POA: Diagnosis not present

## 2023-09-08 NOTE — Progress Notes (Signed)
 Carelink Summary Report / Loop Recorder

## 2023-09-11 DIAGNOSIS — M6281 Muscle weakness (generalized): Secondary | ICD-10-CM | POA: Diagnosis not present

## 2023-09-11 DIAGNOSIS — I2510 Atherosclerotic heart disease of native coronary artery without angina pectoris: Secondary | ICD-10-CM | POA: Diagnosis not present

## 2023-09-11 DIAGNOSIS — R627 Adult failure to thrive: Secondary | ICD-10-CM | POA: Diagnosis not present

## 2023-09-11 DIAGNOSIS — E119 Type 2 diabetes mellitus without complications: Secondary | ICD-10-CM | POA: Diagnosis not present

## 2023-09-11 DIAGNOSIS — I5022 Chronic systolic (congestive) heart failure: Secondary | ICD-10-CM | POA: Diagnosis not present

## 2023-09-11 DIAGNOSIS — J9601 Acute respiratory failure with hypoxia: Secondary | ICD-10-CM | POA: Diagnosis not present

## 2023-09-11 DIAGNOSIS — E782 Mixed hyperlipidemia: Secondary | ICD-10-CM | POA: Diagnosis not present

## 2023-09-11 DIAGNOSIS — I10 Essential (primary) hypertension: Secondary | ICD-10-CM | POA: Diagnosis not present

## 2023-09-14 DIAGNOSIS — M6281 Muscle weakness (generalized): Secondary | ICD-10-CM | POA: Diagnosis not present

## 2023-09-14 DIAGNOSIS — I2510 Atherosclerotic heart disease of native coronary artery without angina pectoris: Secondary | ICD-10-CM | POA: Diagnosis not present

## 2023-09-14 DIAGNOSIS — J9601 Acute respiratory failure with hypoxia: Secondary | ICD-10-CM | POA: Diagnosis not present

## 2023-09-14 DIAGNOSIS — R627 Adult failure to thrive: Secondary | ICD-10-CM | POA: Diagnosis not present

## 2023-09-14 DIAGNOSIS — I10 Essential (primary) hypertension: Secondary | ICD-10-CM | POA: Diagnosis not present

## 2023-09-14 DIAGNOSIS — I5022 Chronic systolic (congestive) heart failure: Secondary | ICD-10-CM | POA: Diagnosis not present

## 2023-09-14 DIAGNOSIS — E782 Mixed hyperlipidemia: Secondary | ICD-10-CM | POA: Diagnosis not present

## 2023-09-14 DIAGNOSIS — E119 Type 2 diabetes mellitus without complications: Secondary | ICD-10-CM | POA: Diagnosis not present

## 2023-09-15 DIAGNOSIS — I5022 Chronic systolic (congestive) heart failure: Secondary | ICD-10-CM | POA: Diagnosis not present

## 2023-09-15 DIAGNOSIS — R627 Adult failure to thrive: Secondary | ICD-10-CM | POA: Diagnosis not present

## 2023-09-15 DIAGNOSIS — J9601 Acute respiratory failure with hypoxia: Secondary | ICD-10-CM | POA: Diagnosis not present

## 2023-09-15 DIAGNOSIS — I2510 Atherosclerotic heart disease of native coronary artery without angina pectoris: Secondary | ICD-10-CM | POA: Diagnosis not present

## 2023-09-17 DIAGNOSIS — I2510 Atherosclerotic heart disease of native coronary artery without angina pectoris: Secondary | ICD-10-CM | POA: Diagnosis not present

## 2023-09-17 DIAGNOSIS — I25119 Atherosclerotic heart disease of native coronary artery with unspecified angina pectoris: Secondary | ICD-10-CM | POA: Diagnosis not present

## 2023-09-17 DIAGNOSIS — E1159 Type 2 diabetes mellitus with other circulatory complications: Secondary | ICD-10-CM | POA: Diagnosis not present

## 2023-09-17 DIAGNOSIS — N184 Chronic kidney disease, stage 4 (severe): Secondary | ICD-10-CM | POA: Diagnosis not present

## 2023-09-17 DIAGNOSIS — I5022 Chronic systolic (congestive) heart failure: Secondary | ICD-10-CM | POA: Diagnosis not present

## 2023-09-17 DIAGNOSIS — I10 Essential (primary) hypertension: Secondary | ICD-10-CM | POA: Diagnosis not present

## 2023-09-17 DIAGNOSIS — J9601 Acute respiratory failure with hypoxia: Secondary | ICD-10-CM | POA: Diagnosis not present

## 2023-09-18 DIAGNOSIS — I10 Essential (primary) hypertension: Secondary | ICD-10-CM | POA: Diagnosis not present

## 2023-09-18 DIAGNOSIS — R627 Adult failure to thrive: Secondary | ICD-10-CM | POA: Diagnosis not present

## 2023-09-18 DIAGNOSIS — I5022 Chronic systolic (congestive) heart failure: Secondary | ICD-10-CM | POA: Diagnosis not present

## 2023-09-18 DIAGNOSIS — E119 Type 2 diabetes mellitus without complications: Secondary | ICD-10-CM | POA: Diagnosis not present

## 2023-09-18 DIAGNOSIS — J9601 Acute respiratory failure with hypoxia: Secondary | ICD-10-CM | POA: Diagnosis not present

## 2023-09-18 DIAGNOSIS — I2510 Atherosclerotic heart disease of native coronary artery without angina pectoris: Secondary | ICD-10-CM | POA: Diagnosis not present

## 2023-09-18 DIAGNOSIS — M6281 Muscle weakness (generalized): Secondary | ICD-10-CM | POA: Diagnosis not present

## 2023-09-18 DIAGNOSIS — E782 Mixed hyperlipidemia: Secondary | ICD-10-CM | POA: Diagnosis not present

## 2023-09-21 DIAGNOSIS — I5022 Chronic systolic (congestive) heart failure: Secondary | ICD-10-CM | POA: Diagnosis not present

## 2023-09-21 DIAGNOSIS — I10 Essential (primary) hypertension: Secondary | ICD-10-CM | POA: Diagnosis not present

## 2023-09-21 DIAGNOSIS — I2510 Atherosclerotic heart disease of native coronary artery without angina pectoris: Secondary | ICD-10-CM | POA: Diagnosis not present

## 2023-09-21 DIAGNOSIS — R627 Adult failure to thrive: Secondary | ICD-10-CM | POA: Diagnosis not present

## 2023-09-21 DIAGNOSIS — M6281 Muscle weakness (generalized): Secondary | ICD-10-CM | POA: Diagnosis not present

## 2023-09-21 DIAGNOSIS — E782 Mixed hyperlipidemia: Secondary | ICD-10-CM | POA: Diagnosis not present

## 2023-09-21 DIAGNOSIS — J9601 Acute respiratory failure with hypoxia: Secondary | ICD-10-CM | POA: Diagnosis not present

## 2023-09-21 DIAGNOSIS — E119 Type 2 diabetes mellitus without complications: Secondary | ICD-10-CM | POA: Diagnosis not present

## 2023-09-22 DIAGNOSIS — I509 Heart failure, unspecified: Secondary | ICD-10-CM | POA: Diagnosis not present

## 2023-09-22 DIAGNOSIS — M6281 Muscle weakness (generalized): Secondary | ICD-10-CM | POA: Diagnosis not present

## 2023-09-22 DIAGNOSIS — I5022 Chronic systolic (congestive) heart failure: Secondary | ICD-10-CM | POA: Diagnosis not present

## 2023-09-23 DIAGNOSIS — J189 Pneumonia, unspecified organism: Secondary | ICD-10-CM | POA: Diagnosis not present

## 2023-09-24 ENCOUNTER — Encounter

## 2023-09-24 DIAGNOSIS — I509 Heart failure, unspecified: Secondary | ICD-10-CM | POA: Diagnosis not present

## 2023-09-24 DIAGNOSIS — J189 Pneumonia, unspecified organism: Secondary | ICD-10-CM | POA: Diagnosis not present

## 2023-09-28 DIAGNOSIS — K2210 Ulcer of esophagus without bleeding: Secondary | ICD-10-CM | POA: Diagnosis not present

## 2023-10-08 DIAGNOSIS — I2510 Atherosclerotic heart disease of native coronary artery without angina pectoris: Secondary | ICD-10-CM | POA: Diagnosis not present

## 2023-10-08 DIAGNOSIS — E119 Type 2 diabetes mellitus without complications: Secondary | ICD-10-CM | POA: Diagnosis not present

## 2023-10-08 DIAGNOSIS — G2581 Restless legs syndrome: Secondary | ICD-10-CM | POA: Diagnosis not present

## 2023-10-08 DIAGNOSIS — I5022 Chronic systolic (congestive) heart failure: Secondary | ICD-10-CM | POA: Diagnosis not present

## 2023-10-08 DIAGNOSIS — N1831 Chronic kidney disease, stage 3a: Secondary | ICD-10-CM | POA: Diagnosis not present

## 2023-10-08 DIAGNOSIS — Z79899 Other long term (current) drug therapy: Secondary | ICD-10-CM | POA: Diagnosis not present

## 2023-10-10 DIAGNOSIS — E1122 Type 2 diabetes mellitus with diabetic chronic kidney disease: Secondary | ICD-10-CM | POA: Diagnosis not present

## 2023-10-20 DIAGNOSIS — I10 Essential (primary) hypertension: Secondary | ICD-10-CM | POA: Diagnosis not present

## 2023-10-20 DIAGNOSIS — E782 Mixed hyperlipidemia: Secondary | ICD-10-CM | POA: Diagnosis not present

## 2023-10-20 DIAGNOSIS — N400 Enlarged prostate without lower urinary tract symptoms: Secondary | ICD-10-CM | POA: Diagnosis not present

## 2023-10-20 DIAGNOSIS — I5022 Chronic systolic (congestive) heart failure: Secondary | ICD-10-CM | POA: Diagnosis not present

## 2023-10-20 DIAGNOSIS — I2510 Atherosclerotic heart disease of native coronary artery without angina pectoris: Secondary | ICD-10-CM | POA: Diagnosis not present

## 2023-10-20 DIAGNOSIS — E119 Type 2 diabetes mellitus without complications: Secondary | ICD-10-CM | POA: Diagnosis not present

## 2023-10-20 DIAGNOSIS — G2581 Restless legs syndrome: Secondary | ICD-10-CM | POA: Diagnosis not present

## 2023-10-21 DIAGNOSIS — I10 Essential (primary) hypertension: Secondary | ICD-10-CM | POA: Diagnosis not present

## 2023-10-21 DIAGNOSIS — I2510 Atherosclerotic heart disease of native coronary artery without angina pectoris: Secondary | ICD-10-CM | POA: Diagnosis not present

## 2023-10-21 DIAGNOSIS — G2581 Restless legs syndrome: Secondary | ICD-10-CM | POA: Diagnosis not present

## 2023-10-21 DIAGNOSIS — N400 Enlarged prostate without lower urinary tract symptoms: Secondary | ICD-10-CM | POA: Diagnosis not present

## 2023-10-21 DIAGNOSIS — E782 Mixed hyperlipidemia: Secondary | ICD-10-CM | POA: Diagnosis not present

## 2023-10-21 DIAGNOSIS — E119 Type 2 diabetes mellitus without complications: Secondary | ICD-10-CM | POA: Diagnosis not present

## 2023-10-21 DIAGNOSIS — I5022 Chronic systolic (congestive) heart failure: Secondary | ICD-10-CM | POA: Diagnosis not present

## 2023-10-25 DIAGNOSIS — R062 Wheezing: Secondary | ICD-10-CM | POA: Diagnosis not present

## 2023-10-26 ENCOUNTER — Encounter

## 2023-10-26 DIAGNOSIS — Z79899 Other long term (current) drug therapy: Secondary | ICD-10-CM | POA: Diagnosis not present

## 2023-10-30 DIAGNOSIS — I5022 Chronic systolic (congestive) heart failure: Secondary | ICD-10-CM | POA: Diagnosis not present

## 2023-10-30 DIAGNOSIS — K2210 Ulcer of esophagus without bleeding: Secondary | ICD-10-CM | POA: Diagnosis not present

## 2023-10-30 DIAGNOSIS — J449 Chronic obstructive pulmonary disease, unspecified: Secondary | ICD-10-CM | POA: Diagnosis not present

## 2023-11-06 DIAGNOSIS — I10 Essential (primary) hypertension: Secondary | ICD-10-CM | POA: Diagnosis not present

## 2023-11-06 DIAGNOSIS — I2510 Atherosclerotic heart disease of native coronary artery without angina pectoris: Secondary | ICD-10-CM | POA: Diagnosis not present

## 2023-11-06 DIAGNOSIS — J449 Chronic obstructive pulmonary disease, unspecified: Secondary | ICD-10-CM | POA: Diagnosis not present

## 2023-11-06 DIAGNOSIS — E119 Type 2 diabetes mellitus without complications: Secondary | ICD-10-CM | POA: Diagnosis not present

## 2023-11-06 DIAGNOSIS — E782 Mixed hyperlipidemia: Secondary | ICD-10-CM | POA: Diagnosis not present

## 2023-11-06 DIAGNOSIS — I509 Heart failure, unspecified: Secondary | ICD-10-CM | POA: Diagnosis not present

## 2023-11-19 DIAGNOSIS — E782 Mixed hyperlipidemia: Secondary | ICD-10-CM | POA: Diagnosis not present

## 2023-11-19 DIAGNOSIS — E119 Type 2 diabetes mellitus without complications: Secondary | ICD-10-CM | POA: Diagnosis not present

## 2023-11-19 DIAGNOSIS — I2510 Atherosclerotic heart disease of native coronary artery without angina pectoris: Secondary | ICD-10-CM | POA: Diagnosis not present

## 2023-11-19 DIAGNOSIS — I10 Essential (primary) hypertension: Secondary | ICD-10-CM | POA: Diagnosis not present

## 2023-11-19 DIAGNOSIS — J449 Chronic obstructive pulmonary disease, unspecified: Secondary | ICD-10-CM | POA: Diagnosis not present

## 2023-11-19 DIAGNOSIS — I5022 Chronic systolic (congestive) heart failure: Secondary | ICD-10-CM | POA: Diagnosis not present

## 2023-11-23 DIAGNOSIS — I2510 Atherosclerotic heart disease of native coronary artery without angina pectoris: Secondary | ICD-10-CM | POA: Diagnosis not present

## 2023-11-23 DIAGNOSIS — E119 Type 2 diabetes mellitus without complications: Secondary | ICD-10-CM | POA: Diagnosis not present

## 2023-11-23 DIAGNOSIS — J449 Chronic obstructive pulmonary disease, unspecified: Secondary | ICD-10-CM | POA: Diagnosis not present

## 2023-11-23 DIAGNOSIS — I509 Heart failure, unspecified: Secondary | ICD-10-CM | POA: Diagnosis not present

## 2023-11-23 DIAGNOSIS — I10 Essential (primary) hypertension: Secondary | ICD-10-CM | POA: Diagnosis not present

## 2023-11-23 DIAGNOSIS — E782 Mixed hyperlipidemia: Secondary | ICD-10-CM | POA: Diagnosis not present

## 2023-11-25 ENCOUNTER — Encounter

## 2023-11-26 ENCOUNTER — Encounter

## 2023-12-24 DIAGNOSIS — E119 Type 2 diabetes mellitus without complications: Secondary | ICD-10-CM | POA: Diagnosis not present

## 2023-12-24 DIAGNOSIS — I5022 Chronic systolic (congestive) heart failure: Secondary | ICD-10-CM | POA: Diagnosis not present

## 2023-12-24 DIAGNOSIS — I10 Essential (primary) hypertension: Secondary | ICD-10-CM | POA: Diagnosis not present

## 2023-12-25 DIAGNOSIS — I130 Hypertensive heart and chronic kidney disease with heart failure and stage 1 through stage 4 chronic kidney disease, or unspecified chronic kidney disease: Secondary | ICD-10-CM | POA: Diagnosis not present

## 2023-12-25 DIAGNOSIS — I5022 Chronic systolic (congestive) heart failure: Secondary | ICD-10-CM | POA: Diagnosis not present

## 2023-12-26 ENCOUNTER — Encounter

## 2023-12-27 ENCOUNTER — Ambulatory Visit

## 2023-12-28 ENCOUNTER — Encounter

## 2024-01-26 ENCOUNTER — Encounter

## 2024-01-27 ENCOUNTER — Ambulatory Visit

## 2024-01-28 ENCOUNTER — Encounter

## 2024-02-26 ENCOUNTER — Encounter

## 2024-02-27 ENCOUNTER — Ambulatory Visit

## 2024-02-29 ENCOUNTER — Encounter

## 2024-03-28 ENCOUNTER — Encounter

## 2024-03-29 ENCOUNTER — Ambulatory Visit

## 2024-03-31 ENCOUNTER — Encounter

## 2024-04-28 ENCOUNTER — Encounter

## 2024-04-29 ENCOUNTER — Ambulatory Visit

## 2024-05-02 ENCOUNTER — Encounter
